# Patient Record
Sex: Female | Born: 1950 | Race: White | Hispanic: Yes | Marital: Single
Health system: Midwestern US, Community
[De-identification: ages and names within clinical notes are randomized; demographics above are authoritative.]

## PROBLEM LIST (undated history)

## (undated) DIAGNOSIS — C50912 Malignant neoplasm of unspecified site of left female breast: Secondary | ICD-10-CM

## (undated) DIAGNOSIS — C50312 Malignant neoplasm of lower-inner quadrant of left female breast: Secondary | ICD-10-CM

## (undated) DIAGNOSIS — R059 Cough, unspecified: Secondary | ICD-10-CM

## (undated) DIAGNOSIS — C50919 Malignant neoplasm of unspecified site of unspecified female breast: Secondary | ICD-10-CM

## (undated) DIAGNOSIS — C50911 Malignant neoplasm of unspecified site of right female breast: Secondary | ICD-10-CM

## (undated) DIAGNOSIS — Z170 Estrogen receptor positive status [ER+]: Secondary | ICD-10-CM

## (undated) DIAGNOSIS — N651 Disproportion of reconstructed breast: Secondary | ICD-10-CM

## (undated) DIAGNOSIS — M75121 Complete rotator cuff tear or rupture of right shoulder, not specified as traumatic: Secondary | ICD-10-CM

## (undated) DIAGNOSIS — N6320 Unspecified lump in the left breast, unspecified quadrant: Secondary | ICD-10-CM

## (undated) DIAGNOSIS — E041 Nontoxic single thyroid nodule: Secondary | ICD-10-CM

## (undated) DIAGNOSIS — R52 Pain, unspecified: Secondary | ICD-10-CM

## (undated) DIAGNOSIS — M810 Age-related osteoporosis without current pathological fracture: Secondary | ICD-10-CM

## (undated) DIAGNOSIS — R609 Edema, unspecified: Secondary | ICD-10-CM

## (undated) DIAGNOSIS — M25569 Pain in unspecified knee: Secondary | ICD-10-CM

## (undated) DIAGNOSIS — F419 Anxiety disorder, unspecified: Secondary | ICD-10-CM

## (undated) DIAGNOSIS — N390 Urinary tract infection, site not specified: Secondary | ICD-10-CM

## (undated) DIAGNOSIS — IMO0002 Reserved for concepts with insufficient information to code with codable children: Secondary | ICD-10-CM

## (undated) DIAGNOSIS — M170 Bilateral primary osteoarthritis of knee: Secondary | ICD-10-CM

## (undated) DIAGNOSIS — T8579XA Infection and inflammatory reaction due to other internal prosthetic devices, implants and grafts, initial encounter: Secondary | ICD-10-CM

## (undated) DIAGNOSIS — D071 Carcinoma in situ of vulva: Secondary | ICD-10-CM

## (undated) DIAGNOSIS — Z452 Encounter for adjustment and management of vascular access device: Secondary | ICD-10-CM

## (undated) DIAGNOSIS — Z853 Personal history of malignant neoplasm of breast: Secondary | ICD-10-CM

## (undated) DIAGNOSIS — Z780 Asymptomatic menopausal state: Secondary | ICD-10-CM

## (undated) DIAGNOSIS — R0602 Shortness of breath: Secondary | ICD-10-CM

## (undated) DIAGNOSIS — Z9189 Other specified personal risk factors, not elsewhere classified: Secondary | ICD-10-CM

## (undated) DIAGNOSIS — Z79899 Other long term (current) drug therapy: Secondary | ICD-10-CM

## (undated) DIAGNOSIS — Z7969 Long term (current) use of other immunomodulators and immunosuppressants: Secondary | ICD-10-CM

## (undated) DIAGNOSIS — I890 Lymphedema, not elsewhere classified: Secondary | ICD-10-CM

## (undated) DIAGNOSIS — M25512 Pain in left shoulder: Secondary | ICD-10-CM

## (undated) DIAGNOSIS — I972 Postmastectomy lymphedema syndrome: Principal | ICD-10-CM

## (undated) DIAGNOSIS — N6324 Unspecified lump in the left breast, lower inner quadrant: Secondary | ICD-10-CM

## (undated) DIAGNOSIS — Z1231 Encounter for screening mammogram for malignant neoplasm of breast: Secondary | ICD-10-CM

## (undated) MED ORDER — CITALOPRAM 20 MG TAB
20 mg | ORAL_TABLET | Freq: Every day | ORAL | Status: DC
Start: ? — End: 2013-02-26

## (undated) MED ORDER — VALACYCLOVIR 500 MG TAB
500 mg | ORAL_TABLET | ORAL | Status: DC
Start: ? — End: 2013-05-29

## (undated) MED ORDER — LORAZEPAM 1 MG TAB
1 mg | ORAL_TABLET | Freq: Three times a day (TID) | ORAL | Status: DC | PRN
Start: ? — End: 2013-01-15

## (undated) MED ORDER — ONDANSETRON 8 MG TAB, RAPID DISSOLVE
8 mg | ORAL_TABLET | Freq: Three times a day (TID) | ORAL | Status: DC | PRN
Start: ? — End: 2013-01-01

## (undated) MED ORDER — MAGIC MOUTHWASH AMBULATORY SIMPLE MEDICATION
ORAL | Status: DC
Start: ? — End: 2013-05-29

## (undated) MED ORDER — HYDROCODONE-ACETAMINOPHEN 5 MG-325 MG TAB
5-325 mg | ORAL_TABLET | ORAL | Status: DC
Start: ? — End: 2013-02-20

## (undated) MED ORDER — OXYCODONE-ACETAMINOPHEN 5 MG-325 MG TAB
5-325 mg | ORAL_TABLET | ORAL | Status: DC | PRN
Start: ? — End: 2013-09-30

## (undated) MED ORDER — METHYLPREDNISOLONE 4 MG TABS IN A DOSE PACK
4 mg | PACK | ORAL | Status: DC
Start: ? — End: 2013-05-29

## (undated) MED ORDER — LIDOCAINE-PRILOCAINE 2.5 %-2.5 % TOPICAL CREAM
CUTANEOUS | Status: DC | PRN
Start: ? — End: 2013-05-29

## (undated) MED ORDER — OXYCODONE-ACETAMINOPHEN 5 MG-325 MG TAB
5-325 mg | ORAL_TABLET | ORAL | Status: DC | PRN
Start: ? — End: 2013-01-07

## (undated) MED ORDER — GRANISETRON 3.1 MG/24 HOUR WEEKLY TRANSDERMAL PATCH
3.1 mg/24 hour | MEDICATED_PATCH | TRANSDERMAL | Status: DC
Start: ? — End: 2013-05-29

## (undated) MED ORDER — HYDROXYZINE 50 MG TAB
50 mg | ORAL_TABLET | Freq: Four times a day (QID) | ORAL | Status: AC | PRN
Start: ? — End: 2013-03-01

## (undated) MED ORDER — CODEINE-BUTALBITAL-ACETAMINOPHEN-CAFFEINE 30 MG-50 MG-325 MG-40 MG CAP
50-325-40-30 mg | ORAL_CAPSULE | ORAL | Status: DC | PRN
Start: ? — End: 2013-05-29

## (undated) MED ORDER — DEXAMETHASONE 4 MG TAB
4 mg | ORAL_TABLET | Freq: Every day | ORAL | Status: DC
Start: ? — End: 2013-05-29

## (undated) MED ORDER — CLONAZEPAM 0.5 MG TAB
0.5 mg | ORAL_TABLET | ORAL | Status: DC
Start: ? — End: 2013-10-25

## (undated) MED ORDER — OXYCODONE-ACETAMINOPHEN 5 MG-325 MG TAB
5-325 mg | ORAL_TABLET | ORAL | Status: DC | PRN
Start: ? — End: 2013-02-05

## (undated) MED ORDER — PEGFILGRASTIM 6 MG/0.6ML SC SYRG
6 mg/0. mL | INJECTION | Freq: Once | SUBCUTANEOUS | Status: AC
Start: ? — End: 2012-12-05

## (undated) MED ORDER — FLUCONAZOLE 150 MG TAB
150 mg | ORAL_TABLET | Freq: Every day | ORAL | Status: AC
Start: ? — End: 2013-02-06

## (undated) MED ORDER — CLONAZEPAM 0.5 MG TAB
0.5 mg | ORAL_TABLET | ORAL | Status: DC
Start: ? — End: 2013-10-30

## (undated) MED ORDER — ESCITALOPRAM 20 MG TAB
20 mg | ORAL_TABLET | Freq: Every day | ORAL | Status: DC
Start: ? — End: 2014-01-07

## (undated) MED ORDER — LORAZEPAM 1 MG TAB
1 mg | ORAL_TABLET | Freq: Three times a day (TID) | ORAL | Status: DC | PRN
Start: ? — End: 2013-02-20

## (undated) MED ORDER — PHENAZOPYRIDINE 200 MG TAB
200 mg | ORAL_TABLET | Freq: Three times a day (TID) | ORAL | Status: AC | PRN
Start: ? — End: 2013-02-16

## (undated) MED ORDER — CLONAZEPAM 0.5 MG TAB
0.5 mg | ORAL_TABLET | ORAL | Status: DC
Start: ? — End: 2013-07-17

## (undated) MED ORDER — CLONAZEPAM 0.5 MG TAB
0.5 mg | ORAL_TABLET | Freq: Two times a day (BID) | ORAL | Status: DC
Start: ? — End: 2013-02-26

## (undated) MED ORDER — ANASTROZOLE 1 MG TAB
1 mg | ORAL_TABLET | Freq: Every day | ORAL | Status: DC
Start: ? — End: 2013-08-06

## (undated) MED ORDER — PROMETHAZINE 12.5 MG TAB
12.5 mg | ORAL_TABLET | Freq: Four times a day (QID) | ORAL | Status: DC | PRN
Start: ? — End: 2013-05-29

## (undated) MED ORDER — EXEMESTANE 25 MG TAB
25 mg | ORAL_TABLET | Freq: Every day | ORAL | Status: AC
Start: ? — End: 2013-11-04

## (undated) MED ORDER — HYDROMORPHONE 2 MG TAB
2 mg | ORAL_TABLET | ORAL | Status: DC | PRN
Start: ? — End: 2013-02-20

## (undated) MED ORDER — OXYCODONE-ACETAMINOPHEN 5 MG-325 MG TAB
5-325 mg | ORAL_TABLET | ORAL | Status: DC | PRN
Start: ? — End: 2015-03-10

## (undated) MED ORDER — CLONAZEPAM 0.5 MG TAB
0.5 mg | ORAL_TABLET | Freq: Two times a day (BID) | ORAL | Status: DC
Start: ? — End: 2013-07-17

## (undated) MED ORDER — ONDANSETRON 8 MG TAB, RAPID DISSOLVE
8 mg | ORAL_TABLET | Freq: Three times a day (TID) | ORAL | Status: DC | PRN
Start: ? — End: 2013-02-05

## (undated) MED ORDER — CLONAZEPAM 0.5 MG TAB
0.5 mg | ORAL_TABLET | ORAL | Status: DC
Start: ? — End: 2013-12-05

## (undated) MED ORDER — HYDROMORPHONE 2 MG TAB
2 mg | ORAL_TABLET | ORAL | Status: DC | PRN
Start: ? — End: 2013-12-05

## (undated) MED ORDER — CLONAZEPAM 0.5 MG TAB
0.5 mg | ORAL_TABLET | Freq: Two times a day (BID) | ORAL | Status: DC
Start: ? — End: 2013-04-08

## (undated) MED ORDER — PROCHLORPERAZINE MALEATE 10 MG TAB
10 mg | ORAL_TABLET | Freq: Four times a day (QID) | ORAL | Status: DC | PRN
Start: ? — End: 2013-02-20

## (undated) MED ORDER — DIPHENHYDRAMINE 50 MG TAB
50 mg | ORAL_TABLET | Freq: Every evening | ORAL | Status: DC | PRN
Start: ? — End: 2013-12-05

## (undated) MED ORDER — VALACYCLOVIR 500 MG TAB
500 mg | ORAL_TABLET | ORAL | Status: DC
Start: ? — End: 2013-05-09

## (undated) MED ORDER — HYDROMORPHONE 2 MG TAB
2 mg | ORAL_TABLET | ORAL | Status: DC | PRN
Start: ? — End: 2013-05-29

## (undated) MED ORDER — OXYCODONE-ACETAMINOPHEN 5 MG-325 MG TAB
5-325 mg | ORAL_TABLET | ORAL | Status: DC | PRN
Start: ? — End: 2013-02-26

## (undated) MED ORDER — CITALOPRAM 40 MG TAB
40 mg | ORAL_TABLET | Freq: Every day | ORAL | Status: DC
Start: ? — End: 2013-09-06

## (undated) MED ORDER — OXYCODONE-ACETAMINOPHEN 5 MG-325 MG TAB
5-325 mg | ORAL_TABLET | ORAL | Status: DC | PRN
Start: ? — End: 2013-07-30

## (undated) MED ORDER — OTHER
Status: DC
Start: ? — End: 2013-02-05

## (undated) MED ORDER — OXYCODONE-ACETAMINOPHEN 5 MG-325 MG TAB
5-325 mg | ORAL_TABLET | ORAL | Status: DC | PRN
Start: ? — End: 2013-07-17

## (undated) MED ORDER — DIAZEPAM 5 MG TAB
5 mg | ORAL_TABLET | Freq: Four times a day (QID) | ORAL | Status: DC | PRN
Start: ? — End: 2013-12-05

## (undated) MED ORDER — OTHER
Status: DC
Start: ? — End: 2013-12-05

## (undated) MED ORDER — ONDANSETRON HCL 8 MG TAB
8 mg | ORAL_TABLET | Freq: Three times a day (TID) | ORAL | Status: DC | PRN
Start: ? — End: 2013-05-29

## (undated) MED ORDER — OXYCODONE-ACETAMINOPHEN 5 MG-325 MG TAB
5-325 mg | ORAL_TABLET | ORAL | Status: DC | PRN
Start: ? — End: 2013-01-15

## (undated) MED ORDER — CITALOPRAM 20 MG TAB
20 mg | ORAL_TABLET | Freq: Every day | ORAL | Status: DC
Start: ? — End: 2013-05-23

## (undated) MED ORDER — OXYCODONE-ACETAMINOPHEN 5 MG-325 MG TAB
5-325 mg | ORAL_TABLET | ORAL | Status: DC | PRN
Start: ? — End: 2013-05-29

## (undated) MED ORDER — LEVOFLOXACIN 250 MG TAB
250 mg | ORAL_TABLET | Freq: Every day | ORAL | Status: AC
Start: ? — End: 2013-02-16

## (undated) MED ORDER — CITALOPRAM 40 MG TAB
40 mg | ORAL_TABLET | Freq: Every day | ORAL | Status: DC
Start: ? — End: 2013-05-31

## (undated) MED ORDER — TAMOXIFEN 20 MG TAB
20 mg | ORAL_TABLET | Freq: Every day | ORAL | Status: DC
Start: ? — End: 2014-03-19

---

## 2008-04-11 LAB — METABOLIC PANEL, COMPREHENSIVE
A-G Ratio: 1.4 (ref 1.1–2.2)
ALT (SGPT): 32 U/L (ref 30–65)
AST (SGOT): 17 U/L (ref 15–37)
Albumin: 4.2 g/dL (ref 3.5–5.0)
Alk. phosphatase: 78 U/L (ref 50–136)
Anion gap: 3 mmol/L — ABNORMAL LOW (ref 5–15)
BUN/Creatinine ratio: 23 — ABNORMAL HIGH (ref 12–20)
BUN: 14 MG/DL (ref 6–20)
Bilirubin, total: 0.6 MG/DL (ref <1.0)
CO2: 31 MMOL/L (ref 21–32)
Calcium: 9.2 MG/DL (ref 8.5–10.1)
Chloride: 103 MMOL/L (ref 97–108)
Creatinine: 0.6 MG/DL (ref 0.6–1.3)
GFR est AA: >60 mL/min/{1.73_m2} (ref >60)
GFR est non-AA: >60 mL/min/{1.73_m2} (ref >60)
Globulin: 2.9 g/dL (ref 2.0–4.0)
Glucose: 77 MG/DL (ref 50–100)
Potassium: 4.3 MMOL/L (ref 3.5–5.1)
Protein, total: 7.1 g/dL (ref 6.4–8.2)
Sodium: 137 MMOL/L (ref 136–145)

## 2008-04-11 LAB — CBC WITH AUTOMATED DIFF
ABS. BASOPHILS: 0 10*3/uL (ref 0.0–0.1)
ABS. EOSINOPHILS: 0.1 10*3/uL (ref 0.0–0.4)
ABS. LYMPHOCYTES: 2.4 10*3/uL (ref 0.8–3.5)
ABS. MONOCYTES: 0.4 10*3/uL (ref 0–1.0)
ABS. NEUTROPHILS: 2.5 10*3/uL (ref 1.8–8.0)
BASOPHILS: 0 % (ref 0–1)
EOSINOPHILS: 2 % (ref 0–7)
HCT: 39.3 % (ref 35.0–47.0)
HGB: 13.1 g/dL (ref 11.5–16.0)
LYMPHOCYTES: 45 % (ref 12–49)
MCH: 29.4 PG (ref 26.0–34.0)
MCHC: 33.3 g/dL (ref 30.0–35.0)
MCV: 88.1 FL (ref 80.0–99.0)
MONOCYTES: 7 % (ref 5–13)
NEUTROPHILS: 46 % (ref 32–75)
PLATELET: 263 10*3/uL (ref 150–400)
RBC: 4.46 M/uL (ref 3.80–5.20)
RDW: 13.2 % (ref 11.5–14.5)
WBC: 5.4 10*3/uL (ref 3.6–11.0)

## 2008-04-11 LAB — T3, UPTAKE
Free thyroxine index: 2.7 (ref 1.4–3.1)
T3 Uptake: 34 % (ref 23–37)

## 2008-04-11 LAB — T4 (THYROXINE): T4, Total: 7.8 ug/dL (ref 5–12)

## 2008-04-11 LAB — LDL, DIRECT: LDL,Direct: 99 mg/dl (ref 0–100)

## 2008-04-11 LAB — TSH 3RD GENERATION: TSH: 1.38 u[IU]/mL (ref 0.35–5.5)

## 2008-04-13 LAB — CALCITONIN: CALCITONIN: 2.5 PG/ML (ref 0.0–4.6)

## 2008-04-15 LAB — METANEPHRINES, PLASMA
METANEPHRINES: 0.17 nmol/L (ref 0.00–0.49)
NORMETANEPHRINE: 0.38 nmol/L (ref 0.00–0.89)

## 2010-04-21 MED ADMIN — lidocaine (PF) (XYLOCAINE) 10 mg/mL (1 %) injection Soln 10 mL: SUBCUTANEOUS | @ 19:00:00 | NDC 63323049205

## 2010-04-21 MED ADMIN — ioversol (OPTIRAY) 320 mg/mL contrast injection 5 mL: INTRA_ARTICULAR | @ 19:00:00 | NDC 00019132306

## 2010-04-21 MED ADMIN — sodium bicarbonate (NEUT) injection 40 mg: SUBCUTANEOUS | @ 19:00:00 | NDC 00409660902

## 2012-02-13 ENCOUNTER — Encounter

## 2012-10-22 NOTE — Progress Notes (Signed)
HISTORY OF PRESENT ILLNESS  Emily [name] is a 61 y.o. female.  HPI New patient referred by Dr. Lisa West for new diagnosis of LEFT breast cancer. Complains of pain to LEFT breast.  Patient is able to palpate a lump.  Denies any other breast changes, skin changes, rashes, or nipple drainage. Biopsy done on LEFT axilla and LEFT breast lump on 10/16/12.    Denies FH of breast and ovarian cancer.    Last mammogram was 09/24/12, BI-RADS 4a.  MRI done 13/14  MRI of brain done today.     Review of Systems   Constitutional: Positive for malaise/fatigue.   Eyes: Negative.    Respiratory: Positive for shortness of breath.    Cardiovascular: Negative.    Gastrointestinal: Negative.    Genitourinary: Negative.    Musculoskeletal: Negative.    Skin: Negative.    Neurological: Positive for headaches.   Endo/Heme/Allergies: Negative.    Psychiatric/Behavioral: Negative.        Physical Exam   Cardiovascular: Normal rate and normal heart sounds.    Pulmonary/Chest: Breath sounds normal. Right breast exhibits no inverted nipple, no mass, no nipple discharge, no skin change and no tenderness. Left breast exhibits no inverted nipple, no mass, no nipple discharge, no skin change and no tenderness. Breasts are symmetrical.       Lymphadenopathy:        Right cervical: No superficial cervical, no deep cervical and no posterior cervical adenopathy present.       Left cervical: No superficial cervical, no deep cervical and no posterior cervical adenopathy present.        Right axillary: No pectoral and no lateral adenopathy present.        Left axillary: No pectoral and no lateral adenopathy present.      ASSESSMENT and PLAN  Encounter Diagnoses   Name Primary?   ??? Breast cancer Yes   ??? Breast cancer, stage 2        Complicated case.  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  Unclear whether this is breast primary  or metastatic. A total of  45 minutes were spent face-to-face with the patient during this encounter and over half of that time was spent on counseling and coordination of care.  We discussed in depth the pathology results and need for surgery.  Discussed  options with risks and complications of mastectomy with reconstruction and lumpectomy. She will need XRT with both given pos node.  Also discussed potential for chemotherapy.  Will discuss with med onc and she will get another opinion.  Will also see plastics today.  Will try to send mammaprint on core bx to determine the utility of possible neoadjuvant chemotherapy.

## 2012-10-22 NOTE — Telephone Encounter (Signed)
mammaprint

## 2012-10-22 NOTE — Telephone Encounter (Signed)
Informed patient of normal chest x-ray.     Faxed result to her work at [phone] per her request.  She has an appointment with Dr. Elroy Channel tomorrow morning.

## 2012-10-22 NOTE — Telephone Encounter (Signed)
Called pt to try to schedule new pt appt tomorrow at 2:00pm with Dr. Elroy Channel. Referral from Dr. Maxwell Marion.

## 2012-10-22 NOTE — Telephone Encounter (Signed)
Emily [name]

## 2012-10-22 NOTE — Communication Body (Signed)
HISTORY OF PRESENT ILLNESS  Emily [name] is a 62 y.o. female.  HPI New patient referred by Dr. Cephus Richer for new diagnosis of LEFT breast cancer. Complains of pain to LEFT breast.  Patient is able to palpate a lump.  Denies any other breast changes, skin changes, rashes, or nipple drainage. Biopsy done on LEFT axilla and LEFT breast lump on 10/16/12.    Denies FH of breast and ovarian cancer.    Last mammogram was 09/24/12, BI-RADS 4a.  MRI done 13/14  MRI of brain done today.     Review of Systems   Constitutional: Positive for malaise/fatigue.   Eyes: Negative.    Respiratory: Positive for shortness of breath.    Cardiovascular: Negative.    Gastrointestinal: Negative.    Genitourinary: Negative.    Musculoskeletal: Negative.    Skin: Negative.    Neurological: Positive for headaches.   Endo/Heme/Allergies: Negative.    Psychiatric/Behavioral: Negative.        Physical Exam   Cardiovascular: Normal rate and normal heart sounds.    Pulmonary/Chest: Breath sounds normal. Right breast exhibits no inverted nipple, no mass, no nipple discharge, no skin change and no tenderness. Left breast exhibits no inverted nipple, no mass, no nipple discharge, no skin change and no tenderness. Breasts are symmetrical.       Lymphadenopathy:        Right cervical: No superficial cervical, no deep cervical and no posterior cervical adenopathy present.       Left cervical: No superficial cervical, no deep cervical and no posterior cervical adenopathy present.        Right axillary: No pectoral and no lateral adenopathy present.        Left axillary: No pectoral and no lateral adenopathy present.      ASSESSMENT and PLAN  Encounter Diagnoses   Name Primary?   ??? Breast cancer Yes   ??? Breast cancer, stage 2        Complicated case.  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  Unclear whether this is breast primary  or metastatic. A total of  45 minutes were spent face-to-face with the patient during this encounter and over half of that time was spent on counseling and coordination of care.  We discussed in depth the pathology results and need for surgery.  Discussed  options with risks and complications of mastectomy with reconstruction and lumpectomy. She will need XRT with both given pos node.  Also discussed potential for chemotherapy.  Will discuss with med onc and she will get another opinion.  Will also see plastics today.  Will try to send mammaprint on core bx to determine the utility of possible neoadjuvant chemotherapy.

## 2012-10-23 NOTE — Telephone Encounter (Signed)
Called [facility] Pathology Department to see if HER-2 and Ki-67 had been run on her recent pathology specimen.      Dr. Iline Oven called me back and let me know that he will order both of these tests, and they will be back at the end of the week.  Advised him that she had an appointment today with Dr. Elroy Channel, Medical Oncology.   He stated that this was a rather unusual case, and offered to speak with Dr. Maxwell Marion or Dr. Elroy Channel regarding the path if they were interested.    I will let them know that he is available and offer his phone number.  He is there every day this week except for Friday.  I will check later in the week to make sure that these results have been faxed to [place].

## 2012-10-23 NOTE — Addendum Note (Signed)
Addended by: Wallie Char on: 10/23/2012 11:49 AM     Modules accepted: Level of Service

## 2012-10-23 NOTE — Progress Notes (Signed)
Emily [name] is a 62 y.o. female new patient for breast cancer.

## 2012-10-23 NOTE — Patient Instructions (Addendum)
mammaprint ordered -- genomic test    HER 2 waiting on    Know it is ER  And PR +    Plan:  Wait for her2    Likely surgery first

## 2012-10-23 NOTE — Telephone Encounter (Signed)
IRVIN - 10/23/2012 @ 8:00 AM    Patient notified

## 2012-10-23 NOTE — Progress Notes (Signed)
[facility]  [address], [address]   [place], [place]   [address]  W: [phone]   F: [phone]      NEW HEME/ONC CONSULT      Reason for visit: evaluation for treatment for   Breast Cancer      HPI:   Emily [name] is a 62 y.o.  female who I was asked to see in consultation at the request of Dr. Maxwell Marion for evaluation for systemic therapy for breast cancer.      She had a screening ultrasound on 09/24/12 showing a 2.6 cm left LN.  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    She has a history of a pheochromocytoma surgically removed in 1997.    She then had a MRI breast on 10/05/12 showing the 2.8 x 2 cm LN as well as a left breast LIQ 0.9 cm x 1.1 cm x 0.6 cm mass.  Biopsy of that mass by FNA on 10/16/12 shows a poorly differentiated carcinoma.  These slides were air-dried, HER 2 not able to be performed on them.    She is anxious today.    DX   Encounter Diagnosis   Name Primary?   ??? Breast cancer, stage 2 Yes              History reviewed. No pertinent past medical history.  Past Surgical History   Procedure Laterality Date   ??? Pr breast surgery procedure unlisted  '91     breast implants   ??? Hx heent       T&A   ??? Hx heent       right adrenal removed   ??? Hx gyn       c-sections   ??? Pr abdomen surgery proc unlisted       hernia mesh repair     History     Social History   ??? Marital Status: SINGLE     Spouse Name: N/A     Number of Children: N/A   ??? Years of Education: N/A     Social History Main Topics   ??? Smoking status: Never Smoker    ??? Smokeless tobacco: Not on file   ??? Alcohol Use: No   ??? Drug Use: Not on file   ??? Sexually Active: Not on file     Other Topics Concern   ??? Not on file     Social History  Narrative   ??? No narrative on file     Family History   Problem Relation Age of Onset   ??? Alzheimer Mother    ??? Diabetes Mother    ??? Heart Disease Father    ??? Diabetes Father    ??? Headache Sister        Current Outpatient Prescriptions   Medication Sig Dispense Refill   ??? b complex vitamins tablet Take 1 Tab by mouth daily.       ??? FOLIC ACID PO Take  by mouth.         No current facility-administered medications for this visit.       Allergies   Allergen Reactions   ??? Aspirin Unknown (comments)     Gi bleeding   ??? Keflex (Cephalexin) Rash   ??? Nsaids (  Non-Steroidal Anti-Inflammatory Drug) Swelling     Throat swells       Review of Systems    A comprehensive review of systems was performed and all systems were negative except for HPI.        Objective:  Physical Exam:  BP 115/57   Pulse 71   Temp(Src) 97 ??F (36.1 ??C) (Oral)   Resp 15   Ht 5\' 6"  (1.676 m)   Wt 123 lb (55.792 kg)   BMI 19.86 kg/m2   SpO2 98%    General:  Alert, cooperative, no distress, appears stated age.   Head:  Normocephalic, without obvious abnormality, atraumatic.   Eyes:  Conjunctivae/corneas clear. PERRL, EOMs intact.   Throat: Lips, mucosa, and tongue normal.    Neck: Supple, symmetrical, trachea midline, no adenopathy, thyroid: no enlargement/tenderness/nodules   Back:   Symmetric, no curvature. ROM normal. No CVA tenderness.   Lungs:   Clear to auscultation bilaterally.   Chest wall:  No tenderness or deformity.   Heart:  Regular rate and rhythm, S1, S2 normal, no murmur, click, rub or gallop.   Breast Exam:  Left breast with a marble size lesion palpable only when the patient leans anteriorly, deep and posterior to her implant; a LN is palpable close to her implant, but size not able to be measured   Abdomen:   Soft, non-tender. Bowel sounds normal. No masses,  No organomegaly.   Extremities: Extremities normal, atraumatic, no cyanosis or edema.   Skin: Skin color, texture, turgor normal. No rashes or lesions.   Lymph nodes: Cervical,  supraclavicular, nodes normal.   Neurologic: CNII-XII intact.       Diagnostic Imaging   Results for orders placed during the hospital encounter of 10/22/12   XR CHEST PA LAT    Narrative **Final Report**       ICD Codes / Adm.Diagnosis: 174.9   / Malignant neoplasm of breast (    Examination:  CR CHEST PA AND LATERAL  - [id number] - Oct 22 2012  1:57PM  Accession No:  [id number]  Reason:  breast cancer and shortness of breath      REPORT:  2 view chest    INDICATION: History of breast cancer.    COMPARISON: None.    FINDINGS: PA and lateral radiographs of the chest demonstrate clear lungs.   The cardiac and mediastinal contours and pulmonary vascularity are normal.   Breast prosthesis is noted. The bones and soft tissues are within normal   limits.        IMPRESSION: No acute process                  Signing/Reading Doctor: Julieanne Cotton [phone])    Approved: Julieanne Cotton [phone])  Oct 22 2012  3:44PM                                      Results for orders placed in visit on 10/22/12   PET BREAST CANCER DIAGNOSIS   10/12/12 PET:  Negative for distant mets  10/22/12 brain MRI:  negative    Lab Results  Lab Results   Component Value Date/Time    WBC 5.4 04/11/2008 10:20 AM    HGB 13.1 04/11/2008 10:20 AM    HCT 39.3 04/11/2008 10:20 AM    PLATELET 263 04/11/2008 10:20 AM    MCV 88.1 04/11/2008 10:20 AM  Lab Results   Component Value Date/Time    Sodium 137 04/11/2008 10:20 AM    Potassium 4.3 04/11/2008 10:20 AM    Chloride 103 04/11/2008 10:20 AM    CO2 31 04/11/2008 10:20 AM    Anion gap 3 04/11/2008 10:20 AM    Glucose 77 04/11/2008 10:20 AM    BUN 14 04/11/2008 10:20 AM    Creatinine 0.6 04/11/2008 10:20 AM    BUN/Creatinine ratio 23 04/11/2008 10:20 AM    GFR est AA >60 04/11/2008 10:20 AM    GFR est non-AA >60 04/11/2008 10:20 AM    Calcium 9.2 04/11/2008 10:20 AM    Bilirubin, total 0.6 04/11/2008 10:20 AM    ALT 32 04/11/2008 10:20 AM    AST 17 04/11/2008 10:20 AM    Alk. phosphatase 78 04/11/2008 10:20 AM    Protein, total 7.1  04/11/2008 10:20 AM    Albumin 4.2 04/11/2008 10:20 AM    Globulin 2.9 04/11/2008 10:20 AM    A-G Ratio 1.4 04/11/2008 10:20 AM       .    Assessment/Plan:  62 y.o. female with a LN + left breast cancer with neuroendocrine features.  ER and PR +.  Outside Records reviewed and summarized above.    PS 0    1. Breast cancer with neuroendocrine features, stage IIA.    We explained to the patient that the goal of systemic adjuvant therapy is to improve the chances for cure and decrease the risk of relapse. We explained why a patient can have microscopic cancer spread now even though physical examination, laboratory studies and imaging studies are negative for cancer. We explained that the same treatments used now as adjuvant or preventive treatments rarely if ever are curative in women who develop metastases.     We discussed that there is no difference in overall survival between neoadjuvant and adjuvant chemotherapy.  We discussed the rationale for neoadjuvant chemotherapy, if chemotherapy is warranted, as it is in this case: to avoid any potential delays in giving chemotherapy following surgery, to be able to see the response of the tumor to chemotherapy, and to potentially downstage the tumor prior to surgery.    Recent literature Pernell Dupre et al, The Breast, Dec 2013) discussed this potentially rare event of a neuroendocrine tumor of the breast.  It is not clear from the pathology that we have that she has a NET of the breast or (more likley) that she has a breast cancer with neuroendocrine features.  Her PET scan suggests strongly that this is one of the 2.  There is no evidence at this time that this is small cell carcinoma, which would be treated differently.  I have discussed this case with Dr. Mayford Knife and both I and the patient would like a 2nd path opinion on what she has already had biopsied and we have ordered this for Dr. Mayford Knife to review.    In reviewing the literature, evidence strongly suggests that standard  breast surgical and medical treatments treat these patients as well as usual-type breast cancers.  There appears to be little difference between the effectiveness of adjuvant small-cell chemo regimens compared with usual type breast cancer regimens (anthrcycline based).  Endocrine therapy should be used for ER + tumors.    Based on the literature, my recommendation is for HER 2 testing of her biopsied LN.  If negative, I recommend proceeding with surgery with Dr. Maxwell Marion with an ANLD.  Port to be placed at that time  for chemo.  I am recommending adjuvant chemo based on her good PS, her young age, and her LN + disease.  Dr. Maxwell Marion has ordered a mammaprint, which I agree with, but may not be able to be performed on existing tissue.    We discussed the potential value of Mammaprint testing.  The [place] 70-gene prognostic profile (Mammaprint??), one of the first gene expression array-based prognosticators, classifies tumors as low-risk or high-risk for breast cancer recurrence. A  validation study used a truly independent patient cohort of 302 women; patients were under age 97, had node-negative T1 to T2 tumors, were treated without adjuvant systemic therapy, and were followed for over 10 years. The 70-gene signature performed independent of clinical variables in predicting time to distant metastasis (hazard ratio, HR 2.13) and overall survival (HR 2.63), but not disease-free survival (HR 1.36). Patients in the gene signature high-risk group had a 10-year overall survival of 70 percent versus 90 percent for patients in the gene signature low-risk group.  The clinical utility of the 70-gene profile will come from a large international study, the MINDACT (Microarray in Node-Negative Disease May Avoid Chemotherapy) trial, in which women with node negative breast cancer undergo clinical risk assessment (using a commonly used online tool, Adjuvant! Online) and the 70-gene signature. Patients with discordant clinical  and genomic predictions are being randomly assigned to receive or not receive adjuvant chemotherapy.  Data suggest that the 70-gene profile also predicts responsiveness to conventional chemotherapeutic regimens. We discussed that it has been validated to correlate with high or low outcome risk for distant metastases.    If her HER 2 result is +, I recommend discussing with her neoadjuvant chemo.    In the future, based on her pheo and now breast cancer history, I recommend genetic counseling.    Thank you for this consult.  All of the patient's questions were answered today.    > 80 min were spent with this patient with > 50% of that time spent in face to face counseling.    Patient Instructions   mammaprint ordered -- genomic test    HER 2 waiting on    Know it is ER  And PR +    Plan:  Wait for her2    Likely surgery first     Follow-up Disposition:  Return in 8 days (on 10/31/2012).    Leotis Shames, MD

## 2012-10-23 NOTE — Addendum Note (Signed)
Addended by: Wallie Char on: 10/23/2012 08:34 AM     Modules accepted: Level of Service

## 2012-10-25 NOTE — Telephone Encounter (Signed)
Spoke with pt and advised that results were not back yet, and that nurse would call [place] doctor's tomorrow, to check on status. Pt states that she would like a call back tomorrow and have copy faxed to [phone].

## 2012-10-25 NOTE — Telephone Encounter (Signed)
Pt called to see if her HER2 results were in. She states she is very "anxious"  and request that if have not received results could we please call [facility], where she had the testing done, to check on them.

## 2012-10-26 NOTE — Telephone Encounter (Signed)
Pt spoke with Dr. Elroy Channel and path report faxed to pt, per request. Pt states that she will call office back to reschedule appointment once she has surgery scheduled.

## 2012-10-26 NOTE — Telephone Encounter (Signed)
Spoke with pathology at [facility] and they state they are sendong over Her2 results for pt.

## 2012-10-29 NOTE — Telephone Encounter (Signed)
Pt called and was a little upset stating she is feeling overwhelmed and would like to know a little more about her treatment plan. Pt reports she is scheduling her surgery and that it may be pushed back to Feb 26, pending plastics availability (pt has appt with plastics tomorrow). Dr. Saul Fordyce office mentioned pt may be getting a port. Pt upset about this as she states she doesn't remember discussing this with Dr. Elroy Channel.    Can you please verify pt's treatment regimen. Pt has requested I call her back and explain this to help her understand what to expect, how to best plan care for her 105 month old child, etc.      Thanks.

## 2012-10-29 NOTE — Telephone Encounter (Signed)
UPDATED PLAN PENDING APPT WITH CHEN    Left lumpectomy with needle loc, left axillary node dissection, portacath insertion, stm, Feb 13??? Gen.

## 2012-10-29 NOTE — Telephone Encounter (Signed)
Spoke with patient and re-confirmed that she will need chemo following surgery. She will speak with Dr. Maxwell Marion about proceeding with surgery and then returning later for reconstruction.

## 2012-10-30 NOTE — Progress Notes (Signed)
Faxed request to [facility] for MammaPrint per Dr. Maxwell Marion.

## 2012-10-31 ENCOUNTER — Encounter

## 2012-10-31 NOTE — Telephone Encounter (Signed)
Change surgery of left modified radical mastectomy with reconstruction, port

## 2012-11-01 NOTE — Telephone Encounter (Signed)
Pt called and wanted hotel information for her family when she has surgery at [facility]. She also wanted hotel information for herself when she has chemotherapy at [facility]. Provided pt with information on hotel discount rates, info, etc.    No other questions, concerns at this time.

## 2012-11-01 NOTE — Telephone Encounter (Signed)
Returned patient call: she has requested that all post op meds that will be needed be called into her preferred pharmacy pre-operatively for her.  After Speaking with Dr. Maxwell Marion I called the patient to let her Dr. Imogene Burn will be handling her post op med. Needs. She will talk to him about it at preop visit

## 2012-11-08 NOTE — Progress Notes (Signed)
Placed packet in mail to patient with information regarding upcoming surgery. Materials included in packet include [facility] pre-op teaching packet, info from [facility] references on breast cancer surgery, mastectomy, breast reconstruction, preventing lymphedema, and port-a-cath. She is having a mastectomy with reconstruction (Dr. Imogene Burn) and port-a-cath placement. I emphasized in the materials for her to defer any plastic surgery questions to Dr. Imogene Burn. I included my business card as a contact.

## 2012-11-08 NOTE — Patient Instructions (Signed)
Preventing Lymphedema After Treatment for Breast Cancer: After Your Visit  Your Care Instructions  Lymphedema is a buildup of fluid in the soft tissues of the body. It can happen in the arm after breast cancer surgery to remove lymph nodes. If there are few or no lymph nodes, fluid can build up in the arm. It can also happen if the lymph system in an arm has been damaged. Infection, tumors, and scar tissue from radiation therapy to the armpit area also can cause fluid to build up.  You may be able to avoid lymphedema or keep it under control by following the tips below. Make sure that you take good care of the skin on your arm and hand. Your skin acts as a barrier to keep out bacteria and prevent infection. It is also important not to overuse the muscles in the arm. And don't expose your arm to very hot or cold temperatures.  Lymphedema can happen soon after breast cancer treatment. Or it may happen many years later. It may affect only part of your arm or hand. In some cases, it affects all of the arm. Make sure to follow these precautions even after you finish treatment. Do not ignore tightness or swelling in or around your arm or hand. You are less likely to have long-term problems if you get these symptoms treated right away.  Follow-up care is a key part of your treatment and safety. Be sure to make and go to all appointments, and call your doctor if you are having problems. It's also a good idea to know your test results and keep a list of the medicines you take.  How can you care for yourself at home?  Skin care  ?? Keep your arm, hand, and armpit clean. Use a mild soap that does not dry out your skin.  ?? Moisturize your skin often.  ?? Take good care of the skin around your fingernails. Do not bite or cut your cuticles.  ?? Ask your doctor how to handle any cuts, scratches, insect bites, or other injuries you may get.  ?? Use sunscreen and insect repellent outdoors to protect your skin from sunburn and insect  bites.  ?? Use an electric razor to shave your armpit. It's less likely to cut or irritate your skin.  Activity  ?? Don't wear clothing or jewelry that is tight on your arm or hand. Your doctor may advise you not to wear a watch or rings on the affected hand.  ?? Wear gloves when you do activities that could hurt the skin on your fingers or hand. Wear them when you garden, do yard work, wash dishes, and clean with chemicals. Use oven mitts when you handle hot food.  ?? Do not have blood drawn from the arm on the side of the lymph node surgery. Do not get injections (shots) or have an IV put in the affected arm.  ?? Do not allow a blood pressure cuff to be placed on that arm. If you are in the hospital, make sure you tell your nurse and other hospital staff about your condition.  ?? Do not expose your arm to very hot or very cold temperatures. For example, do not use hot tubs, saunas, or steam rooms. Do not use a heating pad or cold pack on that arm or shoulder.  ?? Rest your arm often when you do repeated movements, such as vacuum, scrub, or mop.  ?? Try to use your other arm to carry  heavy things, such as grocery bags. Avoid shoulder straps when you carry a briefcase or purse. Carry your purse on your good arm.  ?? Ask your doctor about wearing a compression sleeve and glove (gauntlet). Your doctor may want you to wear these when you exercise or when you fly in an airplane. They can help keep fluid from pooling in your arm and hand.  Exercise  ?? Ask your doctor about exercises for your arm and hand. Your doctor may recommend that you see a physical therapist. This person can teach you how to do self-massage to move fluid out of your arm.  ?? Check with your doctor before you start exercises that use the arm. This includes tennis, rowing, or weight lifting. Your doctor can help you find an activity level that is right for you.  When should you call for help?  Call your doctor now or seek immediate medical care if:  ?? You have  signs of infection, such as:  ?? Increased pain, swelling, warmth, or redness in your arm or hand.  ?? Red streaks leading from the area of lymph node surgery or radiation.  ?? Pus draining from a cut or scrape in your skin on your arm or hand.  ?? A fever.  Watch closely for changes in your health, and be sure to contact your doctor if:  ?? You have a feeling of tightness or swelling in or around your arm or hand.  ?? You have pain, aching, weakness, or a "pins and needles" feeling in your arm or hand.  ?? Your rings, watches, or bracelets feel tight, but you have not gained weight.  ?? You notice that one arm looks larger than the other.  ?? You cannot bend your fingers, wrist, or elbow as much as usual.   Where can you learn more?   Go to [email or web address]  Enter G546 in the search box to learn more about "Preventing Lymphedema After Treatment for Breast Cancer: After Your Visit."   ?? 2006-2013 [employer]. Care instructions adapted under license by [employer] (which disclaims liability or warranty for this information). This care instruction is for use with your licensed healthcare professional. If you have questions about a medical condition or this instruction, always ask your healthcare professional. [employer] disclaims any warranty or liability for your use of this information.  Content Version: 9.9.209917; Last Revised: Mar 01, 2012              Learning About Breast Cancer Surgery  What is breast cancer surgery?  Surgery is a key part of treatment for breast cancer. The type of surgery you have depends on the size, location, and type of the cancer. It also depends on your overall health and personal preferences. You may have surgery that removes the tumor but does not remove the whole breast. Or you may have surgery to remove the whole breast.  Breast cancer usually needs a combination of treatments. You may have surgery to remove all the cancer that can be seen. But  after surgery you may also need radiation, chemotherapy, or hormone therapy. These treatments get rid of any cancer cells that may be left.  What surgeries are used?  ?? Surgery that does not remove the whole breast is called breast-conserving surgery (lumpectomy). It removes the tumor in the breast and a small amount of normal tissue around it. It is also called a partial mastectomy.  ?? Surgeries to remove the whole breast are called:  ??  Total mastectomy. This removes the whole breast, including the nipple.  ?? Nipple-sparing mastectomy. This removes the whole breast but leaves the nipple.  ?? Modified radical mastectomy. This removes the whole breast and some of the lymph nodes under the arm.  ?? Radical mastectomy. This removes the whole breast, the chest muscles under the breast, and all the lymph nodes under the arm.  During the surgery, the doctor may remove lymph nodes from the armpit. The lymph nodes will be looked at under a microscope. This is used to check if cancer has spread from the breast into the lymph nodes. There are two types of lymph node surgery:  ?? Axillary lymph node dissection. This removes some or all of the lymph nodes in the armpit.  ?? Sentinel lymph node biopsy. This removes the lymph node that is the first to receive lymph fluid from the tumor. If the sentinel node does not contain cancer, it is unlikely that the cancer has spread. Then the doctor won't remove any more lymph nodes. If there is cancer in the sentinel node, the doctor will take out other nearby lymph nodes.  What can you expect after surgery?  Your doctor will send the breast tissue to a lab for testing. This will help the doctor know more about the type of cancer you have. It may take up to a week to get the results back. Your doctor will discuss the results with you. You may meet with a doctor who specializes in cancer treatment (an oncologist) to decide about any other treatment you may need. Your personal preferences are  important when choosing a treatment that is right for you.  The amount of time you will need to recover depends on the type of surgery you had and whether you need any more treatment.  After a mastectomy, some women choose to have surgery to restore the way the breast looked. This surgery is called reconstruction. It is done by a Engineer, petroleum. It can sometimes be done at the same time as the mastectomy. Or you may choose to have it done later. Some women choose to wear a breast-form (prosthesis) in their bra instead of having reconstructive surgery. Others decide to go without a prosthesis or reconstructive surgery. You choose what feels right for you.  If you had a lumpectomy, you will probably look the same in a bra. But your breasts may not match in size or shape after surgery. This depends on the size of your breasts and how much tissue was removed.  No matter what kind of surgery you have, you will get information about your treatment. This includes how to prepare, what to expect, and what to do afterward.  Follow-up care is a key part of your treatment and safety. Be sure to make and go to all appointments, and call your doctor if you are having problems. It's also a good idea to know your test results and keep a list of the medicines you take.   Where can you learn more?   Go to [email or web address]  Enter P020 in the search box to learn more about "Learning About Breast Cancer Surgery."   ?? 2006-2013 [employer]. Care instructions adapted under license by [employer] (which disclaims liability or warranty for this information). This care instruction is for use with your licensed healthcare professional. If you have questions about a medical condition or this instruction, always ask your healthcare professional. [employer] disclaims any warranty or liability for your use of  this information.  Content Version: 9.9.209917; Last Revised: November 10, 2011               Learning About Mastectomy  What is a mastectomy?  A mastectomy is surgery to remove the breast. Every woman's treatment plan and breast surgery are different. Your doctor will tell you how much of your breast will be removed. In some cases, lymph nodes and chest muscle are also removed.  When you find out that you have cancer, you may feel many emotions and may need some help coping. Seek out family, friends, and counselors for support. You also can do things at home to make yourself feel better while you go through treatment. Call the American Cancer Society [phone]) or visit its website at www.cancer.org for more information.  How is this surgery done?  ?? In a simple or total mastectomy, the entire breast is removed. The lymph nodes and surrounding muscle are not. This surgery is often done for women with ductal carcinoma in situ, or DCIS. This is cancer that starts in the milk ducts and has not spread. This surgery is also used for women who have a breast removed so they can prevent breast cancer.  ?? In a modified radical mastectomy, the entire breast, some of the lymph nodes in the armpit, and the lining over the chest muscles are removed. The chest muscles are not. This is the most common surgery for breast cancer.  ?? In a radical mastectomy, the entire breast, all of the lymph nodes in the armpit, muscles under the chest, and some of the surrounding fatty tissue are removed. This surgery is rarely done. It is used only when a woman has many tumors and when cancer has entered the chest.  ?? In breast-conserving surgery, the tumor and some healthy breast tissue are removed. Most of the breast remains. Examples of this type of surgery include partial mastectomy, lumpectomy, and quadrantectomy. Different amounts of the breast are removed in each surgery.  ?? In a subcutaneous mastectomy, the tumor and some breast tissue are removed. The nipple, skin, lymph nodes, and chest wall muscles are not removed. This  surgery is rarely done, because some cancer cells may remain.  ?? A skin-sparing technique may be used for a simple or modified radical mastectomy. The breast tissue is removed through a tiny cut that is made around the nipple. This technique does not harm the skin. During a modified radical mastectomy, the lymph nodes are removed by making another cut in the armpit.  The type of surgery you have depends on:  ?? The tumor size, type, and location.  ?? The size of your breast.  ?? The cancer stage.  ?? Whether or not the cancer has spread to the lymph nodes.  ?? Whether or not you have had radiation treatment.  ?? Your age and health.  You and your doctor can decide which surgery is right for you.  What can you expect after surgery?  Recovery  Breast cancer surgery helps many women go on to lead normal lives. Your outcome depends on many things, especially the stage of the cancer.  You will probably be able to go back to work or your normal routine in 3 to 6 weeks. This depends on the type of work you do and any further treatment. Talk with your doctor about other treatment you may need.  Your personal preferences and considerations are important when choosing a treatment that is right for you.  Lymph  nodes  If your lymph nodes are removed, your arm may swell. This is called lymphedema. You will have to take good care of your affected arm. Do not carry heavy things with that arm. Wear loose sleeves and bracelets. Your doctor or physical therapist can teach you arm exercises that will let you move your arm as you always have.  Before you get blood pressure tests, blood draws, or shots in that arm, tell your doctor that you had lymph nodes removed.  Appearance  You will have a scar, but it will fade in time.  You have some choices in how you look. Talk to your doctor about breast forms. Ask about reconstructive surgery. This can sometimes be done at the same time as the mastectomy.  Follow-up care is a key part of your  treatment and safety. Be sure to make and go to all appointments, and call your doctor if you are having problems. It's also a good idea to know your test results and keep a list of the medicines you take.   Where can you learn more?   Go to [email or web address]  Enter [phone] in the search box to learn more about "Learning About Mastectomy."   ?? 2006-2013 [employer]. Care instructions adapted under license by [employer] (which disclaims liability or warranty for this information). This care instruction is for use with your licensed healthcare professional. If you have questions about a medical condition or this instruction, always ask your healthcare professional. [employer] disclaims any warranty or liability for your use of this information.  Content Version: 9.9.209917; Last Revised: November 14, 2011              Mastectomy: Before Your Surgery  What is a mastectomy?  A mastectomy is surgery to remove a breast. There are many ways to do it. The type of surgery you have depends on your situation and if you plan to have surgery to reconstruct the breast.  When this surgery is done to cure or prevent cancer, the entire breast is removed. This includes the nipple. During surgery, the doctor may also check your lymph nodes.  After surgery, you will probably stay overnight in the hospital. You may be able to go back to work or your normal routine in 3 to 6 weeks. It depends on the type of work you do.  When you find out that you have cancer, you may feel many emotions and may need some help coping. Seek out family, friends, and counselors for support. You also can do things at home to make yourself feel better while you go through treatment. Call the American Cancer Society (3344249261) or visit its website at www.cancer.org for more information.  Follow-up care is a key part of your treatment and safety. Be sure to make and go to all appointments, and call your doctor if  you are having problems. It's also a good idea to know your test results and keep a list of the medicines you take.  What happens before surgery?  Surgery can be stressful. This information will help you understand what you can expect. And it will help you safely prepare for surgery.  Preparing for surgery  ?? Understand exactly what surgery is planned, along with the risks, benefits, and other options.  ?? Tell your doctors ALL the medicines, vitamins, supplements, and herbal remedies you take. Some of these can increase the risk of bleeding or interact with anesthesia.  ?? If you take blood thinners, such as  warfarin (Coumadin), clopidogrel (Plavix), or aspirin, be sure to talk to your doctor. He or she will tell you if you should stop taking these medicines before your surgery. Make sure that you understand exactly what your doctor wants you to do.  ?? Your doctor will tell you which medicines to take or stop before your surgery. You may need to stop taking certain medicines a week or more before surgery. So talk to your doctor as soon as you can.  ?? If you have an advance directive, let your doctor know. It may include a living will and a durable power of attorney for health care. Bring a copy to the hospital. If you don't have one, you may want to prepare one. It lets your doctor and loved ones know your health care wishes. Doctors advise that everyone prepare these papers before any type of surgery or procedure.  What happens on the day of surgery?  ?? Follow the instructions exactly about when to stop eating and drinking. If you don't, your surgery may be canceled. If your doctor told you to take your medicines on the day of surgery, take them with only a sip of water.  ?? Take a bath or shower before you come in for your surgery. Do not apply lotions, perfumes, deodorants, or nail polish.  ?? Do not shave the surgical site yourself.  ?? Take off all jewelry and piercings. And take out contact lenses, if you wear  them.  At the hospital or surgery center  ?? Bring a picture ID.  ?? The area for surgery is often marked to make sure there are no errors.  ?? You will be kept comfortable and safe by your anesthesia provider. You will be asleep during the surgery.  ?? The surgery will take about 1 to 3 hours.  ?? You will have one or two tubes under your skin. These will drain fluid from the surgery area while you heal. The doctor will take these out 2 to 10 days after surgery.  Going home  ?? Be sure you have someone to drive you home. Anesthesia and pain medicine make it unsafe for you to drive.  ?? You will be given more specific instructions about recovering from your surgery. They will cover things like diet, wound care, follow-up care, driving, and getting back to your normal routine.  When should you call your doctor?  ?? You have questions or concerns.  ?? You don't understand how to prepare for your surgery.  ?? You become ill before the surgery (such as fever, flu, or a cold).  ?? You need to reschedule or have changed your mind about having the surgery.   Where can you learn more?   Go to [email or web address]  Enter [phone] in the search box to learn more about "Mastectomy: Before Your Surgery."   ?? 2006-2013 [employer]. Care instructions adapted under license by [employer] (which disclaims liability or warranty for this information). This care instruction is for use with your licensed healthcare professional. If you have questions about a medical condition or this instruction, always ask your healthcare professional. [employer] disclaims any warranty or liability for your use of this information.  Content Version: 9.9.209917; Last Revised: March 05, 2012              Mastectomy: What to Expect at Home  Your Recovery  Right after the surgery you will probably feel weak, and you may feel sore for 2 to 3 days.  You may have a pulling or stretching sensation near or under your arm. You may also  have itching, tingling, and throbbing in the area. This will get better in a few days.  You will probably have a plastic or rubber tube, called a drain, to collect fluid from under the affected arm on the side of the surgery. Your doctor will remove this when the fluid buildup slows. This can be in a few days or even several weeks.  You should be able to go back to work or your normal routine in 3 to 6 weeks. This depends on the type of work you do and any other treatment you may need.  When you find out that you have cancer, you may feel many emotions and may need some help coping. This is common. Seek out family, friends, and counselors for support. You also can do things at home to make yourself feel better while you go through treatment. Call the American Cancer Society [phone]) or visit its website at www.cancer.org for more information.  This care sheet gives you a general idea about how long it will take for you to recover. But each person recovers at a different pace. Follow the steps below to get better as quickly as possible.  How can you care for yourself at home?  Activity  ?? Rest when you feel tired. Getting enough sleep will help you recover. After any activity, rest and raise your affected arm for a period of time equal to your activity time.  ?? Try to walk each day. Start by walking a little more than you did the day before. Bit by bit, increase the amount you walk. Walking boosts blood flow and helps prevent pneumonia and constipation.  ?? Avoid strenuous activities, such as biking, jogging, weightlifting, or aerobic exercise, until your doctor says it is okay. This includes housework, especially if you have to use your affected arm. You will probably be able to do your normal activities in 3 to 6 weeks. Avoid repeated motions with your affected arm, such as weed pulling, window cleaning, or vacuuming, for 6 months.  ?? Avoid lifting anything over 10 to 15 pounds for 4 to 6 weeks. This may  include a child, grocery bags, a heavy briefcase or backpack, cat litter or dog food bags, or a vacuum cleaner.  ?? Ask your doctor when you can drive again.  ?? You will probably be able to go back to work or your normal routine in 3 to 6 weeks. This depends on the type of work you do and any further treatment.  ?? Wait to take a shower until your drain is removed. Your doctor may also ask you to wait to take a shower until your staples or stitches have been removed. This is usually in about 1 week. Do not take a bath for about 2 weeks, or until the wound has healed.  Diet  ?? You can eat your normal diet. If your stomach is upset, try bland, low-fat foods like plain rice, broiled chicken, toast, and yogurt.  ?? Drink plenty of fluids (unless your doctor tells you not to).  ?? You may notice that your bowels are not regular right after your surgery. This is common. Try to avoid constipation and straining with bowel movements. Take a fiber supplement such as Citrucel or Metamucil every day. If you have not had a bowel movement after a couple of days, take a mild laxative like Milk of Magnesia or a  stool softener like Colace.  Medicines  ?? Take pain medicines exactly as directed.  ?? If the doctor gave you a prescription medicine for pain, take it as prescribed.  ?? If you are not taking a prescription pain medicine, ask your doctor if you can take an over-the-counter medicine.  ?? If your doctor prescribed antibiotics, take them as directed. Do not stop taking them just because you feel better. You need to take the full course of antibiotics.  ?? If you think your pain medicine is making you sick to your stomach:  ?? Take your medicine after meals (unless your doctor has told you not to).  ?? Ask your doctor for a different pain medicine.  Incision care  ?? You will have a dressing over the cut (incision). A dressing helps the incision heal and protects it. Your doctor will tell you how to take care of this.  ?? You may be  wearing a special bra (surgi-bra) that holds your dressing in place after the surgery. Your doctor will tell you when you can stop wearing the bra.  Exercise  ?? If you had any lymph nodes removed from under your arm, your doctor will advise you to do arm exercises. Do not do the exercises until your doctor says it is okay.  Ice and elevation  ?? Do not use ice for swelling or pain.  ?? Prop up your arm on a pillow when you sit or lie down. Try to keep your arm above the level of your heart. This will help reduce swelling.  Other instructions  ?? You may have one or more drains in your surgery site. Your doctor will tell you how to take care of them.  ?? Be sure to continue breast self-exams on your other breast.  ?? If you had a mastectomy, take precautions to prevent swelling in your arm. This is called lymphedema.  ?? Wear gloves when you garden, handle garbage, wash dishes, and clean house.  ?? Protect your hands and arms from burns, including sunburns.  ?? Do not wear tight sleeves, elastic cuffs, bracelets, wristwatches, or rings on the affected arm.  ?? Do not let anyone take blood pressure, draw blood, or give shots in that arm.  ?? Do not carry heavy purses, suitcases, grocery bags, and other heavy items with that arm. Keep the skin of that arm well moisturized.  ?? Do not cut your cuticles.  ?? Use an electric shaver if you shave your armpits.  ?? Avoid insect bites.  ?? Wear medical alert jewelry that says you can develop lymphedema. You can buy this at most drugstores and on the Internet.  Follow-up care is a key part of your treatment and safety. Be sure to make and go to all appointments, and call your doctor if you are having problems. It???s also a good idea to know your test results and keep a list of the medicines you take.  When should you call for help?  Call 911 anytime you think you may need emergency care. For example, call if:  ?? You passed out (lost consciousness).  ?? You have severe trouble breathing.  ??  You have sudden chest pain and shortness of breath, or you cough up blood.  Call your doctor now or seek immediate medical care if:  ?? You have signs of lymphedema.  ?? You have a feeling of tightness or swelling in or around your arm.  ?? You have pain, weakness that keeps getting  worse, or a tingling "pins and needles" feeling.  ?? Your arm feels full or heavy.  ?? You notice that your hand or wrist is becoming stiff and hard to move.  ?? You notice swelling in your fingers.  ?? Fluid collects under the skin of the surgery site.  ?? Fluid is leaking around the drain, you have a sudden increase in fluid, or you have no new fluid in the drain for 24 hours.  ?? You have sudden swelling of your arm, hands, or fingers.  ?? You have pain that does not go away when you take your pain medicine.  ?? You have loose stitches, your incision comes open, or the skin around the incision turns dark, purple or black.  ?? Bright red blood has soaked through your bandage.  ?? You have signs of infection, such as:  ?? Increased pain, swelling, warmth, or redness.  ?? Red streaks leading from the incision.  ?? Pus draining from the incision.  ?? A fever.  Watch closely for changes in your health, and be sure to contact your doctor if:  ?? You feel any changes in your other breast during breast self-exams. This includes lumps, skin changes, or nipple drainage.  ?? Your rings, watches, or bracelets feel tight, but you have not gained weight.  ?? You feel anxious or depressed, or have trouble sleeping.  ?? You do not have a bowel movement after taking a laxative.   Where can you learn more?   Go to [email or web address]  Enter S340 in the search box to learn more about "Mastectomy: What to Expect at Home."   ?? 2006-2013 [employer]. Care instructions adapted under license by [employer] (which disclaims liability or warranty for this information). This care instruction is for use with your licensed healthcare professional. If you  have questions about a medical condition or this instruction, always ask your healthcare professional. [employer] disclaims any warranty or liability for your use of this information.  Content Version: 9.9.209917; Last Revised: December 29, 2010              Implanted Port: What to Expect at Home  Your Recovery  You have had a procedure to implant a port. The port looks like a small bump under your skin. A thin, flexible tube called a catheter runs under the skin from the port into a large vein.  You may have the port for weeks, months, or longer. You will be able to get medicine, blood, nutrients, or other fluids with more comfort. The port can be used right away.  You will probably have some discomfort and bruising at the port site. This will go away in a few days.  You may have strips of tape on the cut (incision) the doctor made, or the cut may have been closed with glue. It may be covered with a small bandage.  This care sheet gives you a general idea about how long it will take for you to recover. But each person recovers at a different pace. Follow the steps below to feel better as quickly as possible.  How can you care for yourself at home?  Activity  ?? Avoid arm and upper body movements that may pull on the catheter. These movements include heavy weight lifting and vigorous use of your arms.  ?? You will probably need to take 1 day off from work and will be able to return to normal activities shortly after. This depends on the type  of work you do, why you have the catheter, and how you feel.  ?? Ask your doctor when you can drive again. Pay special attention when pulling your seat belt across your chest so it doesn't pull out the catheter. It's okay if the seat belt lays over the catheter.  Medicines  ?? Take pain medicines exactly as directed.  ?? If the doctor gave you a prescription medicine for pain, take it as prescribed.  ?? If you are not taking a prescription pain medicine, ask your doctor if  you can take an over-the-counter medicine.  ?? If you think your pain medicine is making you sick to your stomach:  ?? Take your medicine after meals (unless your doctor has told you not to).  ?? Ask your doctor for a different pain medicine.  Incision care  ?? If you have a bandage, your doctor will tell you when you can remove it. After you remove the bandage, you may shower. Wash the area with soap and water and pat it dry. Don't use hydrogen peroxide or alcohol, which can slow healing. You may cover the area with a gauze bandage if it weeps or rubs against clothing. Change the bandage every day.  ?? If you have strips of tape on the cut (incision) the doctor made, leave the tape on for a week or until it falls off.  Other instructions  ?? Always carry the medical alert card that your doctor gives you. It contains information about your port. It will tell health care workers you have a port in case you need emergency care.  ?? Wear loose clothing over the port for the first 10 to 14 days. When getting dressed, be careful not to rub the port.  Follow-up care is a key part of your treatment and safety. Be sure to make and go to all appointments, and call your doctor if you are having problems. It's also a good idea to know your test results and keep a list of the medicines you take.  When should you call for help?  Call 911 anytime you think you may need emergency care. For example, call if:  ?? You passed out (lost consciousness).  ?? You have severe trouble breathing.  ?? You have sudden chest pain and shortness of breath, or you cough up blood.  Call your doctor now or seek immediate medical care if:  ?? You have signs of infection, such as:  ?? Increased pain, swelling, warmth, or redness near the port.  ?? Red streaks leading from the port.  ?? Pus draining from the port.  ?? A fever.  ?? You have pain or swelling in your neck or arm.  ?? You have trouble breathing or chest pain.  Watch closely for changes in your health, and  be sure to contact your doctor if:  ?? You have any problems with your port.   Where can you learn more?   Go to [email or web address]  Enter M256 in the search box to learn more about "Implanted Port: What to Expect at Home."   ?? 2006-2013 [employer]. Care instructions adapted under license by [employer] (which disclaims liability or warranty for this information). This care instruction is for use with your licensed healthcare professional. If you have questions about a medical condition or this instruction, always ask your healthcare professional. [employer] disclaims any warranty or liability for your use of this information.  Content Version: 9.9.209917; Last Revised: January 18, 2011  Implanted Port: Before Your Procedure  What is an implanted port?  An implanted port is a device placed, in most cases, under the skin of your chest below your collarbone. It is made of plastic, stainless steel, or titanium. The port is usually about the size of a quarter, but thicker. A thin, flexible tube called a catheter runs under the skin from the port into a large vein. A silicone bubble (septum) is in the center of the port.  An implanted port is a type of central venous catheter, or central venous line.  The port is used to give you medicine, blood products, nutrients, or fluids over a long period of time. You may have it for weeks, months, or longer. The port also can be used to draw blood for tests. The port makes doing these things more comfortable for you.  A needle is used to put fluid into the port. You will only feel a mild prick. Some implanted ports contain a small reservoir that can be filled with the medicine or fluid. The reservoir slowly releases the medicine into the bloodstream. A special needle (called a [name] needle) may stay in the port for a short time.  Your doctor will give you medicine to make you sleep or feel relaxed. Your doctor will then thread  the catheter up a vein in your neck or chest to a larger vein and put the port in. The port will remain just under your skin. It looks like a small bump under the skin.  Follow-up care is a key part of your treatment and safety. Be sure to make and go to all appointments, and call your doctor if you are having problems. It's also a good idea to know your test results and keep a list of the medicines you take.  What happens before the procedure?  Having a procedure can be stressful. This information will help you understand what you can expect and how to safely prepare for your procedure.  Preparing for the procedure  ?? Bring a list of questions to ask your doctors. It is important that you understand exactly what procedure is planned, the risks, benefits, and other options before your procedure.  ?? Tell your doctors ALL the medicines, vitamins, supplements, and herbal remedies you take. Some of these can increase the risk of bleeding or interact with anesthesia. Your doctor will tell you which medicines to take or stop before your procedure.  ?? If you take blood thinners, such as warfarin (Coumadin), clopidogrel (Plavix), or aspirin, be sure to talk to your doctor. He or she will tell you if you should stop taking these medicines before your procedure. Make sure that you understand exactly what your doctor wants you to do.  ?? You may need to stop taking certain medicines a week or more before your procedure, so talk to your doctor as soon as you can.  ?? Before your procedure, you may speak with an anesthesia provider to discuss your anesthetic options, including the risks, benefits, and alternatives to each. This may be on the phone or in person.  Taking care of yourself before the procedure  ?? Build healthy habits into your life. Changes are best made several weeks before the procedure, since your body may react to sudden changes in your habits.  ?? Stay as active as you can.  ?? Eat a healthy diet.  ?? Cut back or  quit alcohol and tobacco.  ?? If you have an advance directive???which may  include a living will and a durable power of attorney for health care???let your doctor know. If you do not have one, you may want to prepare one so your doctor and loved ones know your health care wishes. Doctors recommend that everyone prepare these papers before a procedure, regardless of the type of procedure or condition.  What happens on the day of the procedure?  ?? Follow the instructions exactly about when to stop eating and drinking, or your procedure may be canceled. If your doctor has instructed you to take your medicines on the day of the procedure, please do so using only a sip of water.  ?? Take a bath or shower before you come in for your procedure. Do not apply lotions, perfumes, deodorants, or nail polish.  ?? Remove all jewelry, piercings, and contact lenses.  ?? Leave your valuables at home.  At the hospital or surgery center  ?? Bring a picture ID.  ?? A small tube (IV) may be placed in a vein, to give you fluids and medicine to help you relax.  ?? You will be kept comfortable and safe by your anesthesia provider. The anesthesia may range from making you fully asleep, to simply numbing the area being worked on. This will depend on the procedure you are having, as well as a discussion between your doctor, the anesthesia provider, and you.  ?? The procedure will take about 1 hour.  ?? As you wake up in the recovery room, the nurse will check to be sure you are stable and comfortable. It is important for you to tell your doctor and nurse how you feel and ask questions about any concerns you may have.  Going home  ?? Be sure you have someone to drive you home.  ?? For your safety, you should not drive until you are no longer taking pain medicines, and you can move and react easily.  ?? You will be told how to use and care for the implanted port.  ?? You will be given more specific instructions about recovering from your procedure, including  activity and when you may return to work.  When should you call your doctor?  ?? You have questions or concerns.  ?? You don't understand how to prepare for your procedure.  ?? You become ill before the procedure (such as fever, flu, or a cold).  ?? You need to reschedule or have changed your mind about having the procedure.   Where can you learn more?   Go to [email or web address]  Enter L362 in the search box to learn more about "Implanted Port: Before Your Procedure."   ?? 2006-2013 [employer]. Care instructions adapted under license by [employer] (which disclaims liability or warranty for this information). This care instruction is for use with your licensed healthcare professional. If you have questions about a medical condition or this instruction, always ask your healthcare professional. [employer] disclaims any warranty or liability for your use of this information.  Content Version: 9.9.209917; Last Revised: January 18, 2011              Breast Reconstruction With Expander or Implant: Before Your Surgery  What is breast reconstruction with an expander or implant?  Breast reconstruction is a type of surgery. It rebuilds your breast after you've had part or all of a breast removed. It is often done for women who have cancer. But women who have problems with breast development can also have this surgery. It usually takes  more than one surgery to rebuild a breast. You and your doctor will plan your surgery or surgeries based on your wishes and your health.  In this type of surgery, the doctor uses a device called an implant. The implant gives your breast its shape. The nipple and the brown area around it (areola) are usually created later.  In some cases, a balloon is used to stretch your skin and muscle before the implant is put in. The balloon is called a tissue expander. This stretching happens over a period of months in your doctor's office. Every 1 to 2 weeks, the  balloon is expanded with a little more salt water.  You will probably be asleep during the surgery. And you may get a medicine that numbs the breast area. The doctor will do his or her best to make cuts in places on your body that won't be seen. These cuts are called incisions. Sometimes the doctor uses the same incisions that were used to remove the cancer. The incisions leave scars that fade with time.  After surgery, you will probably go home the same day or the next day. Many women can go back to work or their normal routine in 3 to 6 weeks. But it depends on the type of work you do.  It's important to know that your breasts will look different after surgery. Your new breast may be more firm, round, or flat than your other breast. It may also not look the same as the breast that was removed. Some women have surgery on the other breast to make them look as alike as possible.  Follow-up care is a key part of your treatment and safety. Be sure to make and go to all appointments, and call your doctor if you are having problems. It's also a good idea to know your test results and keep a list of the medicines you take.  What happens before surgery?  Surgery can be stressful. This information will help you understand what you can expect. And it will help you safely prepare for surgery.  Preparing for surgery  ?? Understand exactly what surgery is planned, along with the risks, benefits, and other options.  ?? Tell your doctors ALL the medicines, vitamins, supplements, and herbal remedies you take. Some of these can increase the risk of bleeding or interact with anesthesia.  ?? If you take blood thinners, such as warfarin (Coumadin), clopidogrel (Plavix), or aspirin, be sure to talk to your doctor. He or she will tell you if you should stop taking these medicines before your surgery. Make sure that you understand exactly what your doctor wants you to do.  ?? Your doctor will tell you which medicines to take or stop before  your surgery. You may need to stop taking certain medicines a week or more before surgery. So talk to your doctor as soon as you can.  ?? If you have an advance directive, let your doctor know. It may include a living will and a durable power of attorney for health care. Bring a copy to the hospital. If you don't have one, you may want to prepare one. It lets your doctor and loved ones know your health care wishes. Doctors advise that everyone prepare these papers before any type of surgery or procedure.  What happens on the day of surgery?  ?? Follow the instructions exactly about when to stop eating and drinking. If you don't, your surgery may be canceled. If your doctor told you  to take your medicines on the day of surgery, take them with only a sip of water.  ?? Take a bath or shower before you come in for your surgery. Do not apply lotions, perfumes, deodorants, or nail polish.  ?? Do not shave the surgical site yourself.  ?? Take off all jewelry and piercings. And take out contact lenses, if you wear them.  At the hospital or surgery center  ?? Bring a picture ID.  ?? The area for surgery is often marked to make sure there are no errors.  ?? You will be kept comfortable and safe by your anesthesia provider. You will be asleep during the surgery.  ?? The surgery will take 1 to 2 hours.  Going home  ?? Be sure you have someone to drive you home. Anesthesia and pain medicine make it unsafe for you to drive.  ?? You will be given more specific instructions about recovering from your surgery. They will cover things like diet, wound care, follow-up care, driving, and getting back to your normal routine.  When should you call your doctor?  ?? You have questions or concerns.  ?? You don't understand how to prepare for your surgery.  ?? You become ill before the surgery (such as fever, flu, or a cold).  ?? You need to reschedule or have changed your mind about having the surgery.   Where can you learn more?   Go to  [email or web address]  Enter V966 in the search box to learn more about "Breast Reconstruction With Expander or Implant: Before Your Surgery."   ?? 2006-2013 [employer]. Care instructions adapted under license by [employer] (which disclaims liability or warranty for this information). This care instruction is for use with your licensed healthcare professional. If you have questions about a medical condition or this instruction, always ask your healthcare professional. [employer] disclaims any warranty or liability for your use of this information.  Content Version: 9.9.209917; Last Revised: March 05, 2012              Breast Reconstruction With Expander or Implant: What to Expect at Home  Your Recovery  Breast reconstruction is surgery to rebuild the shape of your breast after you have had part or all of your breast removed because of cancer. It may also be done for women who have problems with breast development. Right after the surgery you will probably feel weak, and you may feel pain for 2 to 3 weeks. You may have a pulling or stretching feeling in your breast area. You can expect to feel better and stronger each day, although you may need pain medicine for a week or two. You may get tired easily or have less energy than usual. This may last for several weeks after surgery.  Stitches usually are removed in 5 to 10 days.  Your new breast may feel firmer and look rounder or flatter than your other breast. The new breast may not have the same shape as your breast did before it was removed. Breast reconstruction cannot restore normal feeling to your breast, but in time, some feeling may return. It may take several months for your breast to heal.  You may have surgery to create a nipple and the brown area around it a few months after your breast is reconstructed. Breast reconstruction can improve your appearance and renew your self-confidence. Keep in mind that it may take  time to get used to your new breast. You may want to  talk with a counselor if you need more support as you get used to your new appearance.  This care sheet gives you a general idea about how long it will take for you to recover. But each person recovers at a different pace. Follow the steps below to get better as quickly as possible.  How can you care for yourself at home?  Activity  ?? Rest when you feel tired. Getting enough sleep will help you recover.  ?? For about 6 weeks after surgery, avoid lifting anything that would make you strain. This may include a child, heavy grocery bags, milk containers, a heavy briefcase or backpack, cat litter or dog food bags, a vacuum cleaner, or anything that weighs more than 10 to 15 pounds. Do not lift anything over your head for 3 to 6 weeks.  ?? You may have pain for a few weeks after surgery. Support the area with your hands or a firm pillow when you bend, cough, or move.  ?? Ask your doctor when you can drive again.  ?? Ask your doctor when it is okay for you to have sex.  ?? You can take your first shower the day after the drain near your incision is removed. This is usually in about 1 week. Do not take a bath or soak in a hot tub for about 4 weeks.  ?? You will probably be able to go back to work or your normal routine in 3 to 6 weeks. This depends on the type of work you do and any further treatment.  Diet  ?? You can eat your normal diet. If your stomach is upset, try bland, low-fat foods like plain rice, broiled chicken, toast, and yogurt.  ?? Drink plenty of fluids (unless your doctor tells you not to).  ?? You may notice that your bowel movements are not regular right after your surgery. This is common. Try to avoid constipation and straining with bowel movements. You may want to take a fiber supplement. If you have not had a bowel movement after a couple of days, ask your doctor about taking a mild laxative.  Medicines  ?? Take pain medicines exactly as directed.  ?? If the  doctor gave you a prescription medicine for pain, take it as prescribed.  ?? If you are not taking a prescription pain medicine, ask your doctor if you can take an over-the-counter medicine.  ?? If you think your pain medicine is making you sick to your stomach:  ?? Take your medicine after meals (unless your doctor has told you not to).  ?? Ask your doctor for a different pain medicine.  If you were given medicine for nausea, take it as directed.  ?? If your doctor prescribed antibiotics, take them as directed. Do not stop taking them just because you feel better. You need to take the full course of antibiotics.  Incision care  ?? If your doctor gave you specific instructions on how to care for your incision, follow those instructions.  ?? You may be wearing a special bra that holds your bandages in place after the surgery. Your doctor will tell you when you can stop wearing the bra. Your doctor may want you to wear the bra at night as well as during the day for several weeks. Do not wear an underwire bra for 1 month.  ?? If you have strips of tape on the incision, leave the tape on for a week or until it falls off.  ??  Wash the area daily with warm, soapy water, and pat it dry. Don't use hydrogen peroxide or alcohol, which can slow healing.  ?? You may cover the area with a gauze bandage if it weeps or rubs against clothing. Change the bandage 1 to 2 times every day. Consider having someone help you with this.  ?? Keep the area clean and dry.  Exercise  ?? Try to walk each day. Start by walking a little more than you did the day before. Bit by bit, increase the amount you walk. Walking boosts blood flow and helps prevent pneumonia, constipation, and blood clots in your legs.  ?? Avoid strenuous activities, such as bicycle riding, jogging, weight lifting, or aerobic exercise, until your doctor says it is okay. This includes housework, especially if you have to use the arm on the side of your surgery.  ?? Your doctor will tell  you when to begin stretching exercises and normal activities.  Ice and elevation  ?? To reduce swelling and pain, put ice or a cold pack over your breast for 10 to 20 minutes at a time. Put a thin cloth between the ice and your skin.  ?? Prop up the arm on the side of your surgery on a pillow when you sit or lie down. Try to keep your arm above the level of your heart. This will help reduce swelling.  Other instructions  ?? You may have one or more drains near your incision. Your doctor will tell you how to take care of them. Drains are usually removed in the first week after surgery.  Follow-up care is a key part of your treatment and safety. Be sure to make and go to all appointments, and call your doctor if you are having problems. It???s also a good idea to know your test results and keep a list of the medicines you take.  When should you call for help?  Call 911 anytime you think you may need emergency care. For example, call if:  ?? You passed out (lost consciousness).  ?? You have sudden chest pain and shortness of breath, or you cough up blood.  Call your doctor now or seek immediate medical care if:  ?? You have severe pain in your breast area.  ?? You have fluid under the skin or a lot of swelling at the surgery site.  ?? You are sick to your stomach or cannot keep fluids down.  ?? You have pain that does not get better after you take pain medicine.  ?? You have loose stitches, or your incision comes open.  ?? You bleed through a large bandage over your incision.  ?? You develop a cough.  ?? You have signs of infection, such as:  ?? Increased pain, swelling, warmth, or redness.  ?? Red streaks leading from the incision.  ?? Pus draining from the incision.  ?? A fever.  ?? You have signs of a blood clot, such as:  ?? Pain in your calf, back of the knee, thigh, or groin.  ?? Redness and swelling in your leg or groin.  Watch closely for any changes in your health, and be sure to contact your doctor if:  ?? Your swelling or pain is  getting worse.  ?? Your breast with an implant gets hard or hurts, or the shape changes.  ?? You do not have a bowel movement after taking a laxative or using a suppository.  ?? You feel anxious or depressed or have  trouble sleeping.  ?? You are not getting better as expected.   Where can you learn more?   Go to [email or web address]  Enter [phone] in the search box to learn more about "Breast Reconstruction With Expander or Implant: What to Expect at Home."   ?? 2006-2013 [employer]. Care instructions adapted under license by [employer] (which disclaims liability or warranty for this information). This care instruction is for use with your licensed healthcare professional. If you have questions about a medical condition or this instruction, always ask your healthcare professional. [employer] Inc[employer] disclaims any warranty or liability for your use of this information.  Content Version: 9.9.209917; Last Revised: May 08, 2012

## 2012-11-13 NOTE — Telephone Encounter (Signed)
Patient had many pre-op questions.  Wondered about JP drain care, how many drains she might have.  I went over all of this with her.  Wanted to know how long she would be in the hospital, when she needed to come back to see Dr. Maxwell Marion.  Would like Rx for wig, and I will send this to her.  Also would like her Rx for her pain medication prior to her surgery, and I will have Dr. Maxwell Marion write this out tomorrow and mail it to her.  Asked about PAT and what they would do, and I reviewed all of this with her.  She felt like all of her questions were answered, and she was feeling a little less anxious.    I am going to mail her the prescriptions, a handout on JP care, a urine cup for measurement of drainage, drain pouches and a camisole.  I asked her to call me if she had any further questions.

## 2012-11-14 NOTE — Progress Notes (Signed)
Mailed Rx's for wigs and pain med for post-op, urine container for measuring, drain pouches, JP drain care sheet and camisole to patient's home address.

## 2012-11-21 ENCOUNTER — Encounter

## 2012-11-21 LAB — METABOLIC PANEL, COMPREHENSIVE
A-G Ratio: 1.5 (ref 1.1–2.2)
ALT (SGPT): 41 U/L (ref 12–78)
AST (SGOT): 23 U/L (ref 15–37)
Albumin: 4.2 g/dL (ref 3.5–5.0)
Alk. phosphatase: 93 U/L (ref 50–136)
Anion gap: 5 mmol/L (ref 5–15)
BUN/Creatinine ratio: 33 — ABNORMAL HIGH (ref 12–20)
BUN: 12 MG/DL (ref 6–20)
Bilirubin, total: 0.3 MG/DL (ref 0.2–1.0)
CO2: 31 MMOL/L (ref 21–32)
Calcium: 9 MG/DL (ref 8.5–10.1)
Chloride: 103 MMOL/L (ref 97–108)
Creatinine: 0.36 MG/DL — ABNORMAL LOW (ref 0.45–1.15)
GFR est AA: >60 mL/min/{1.73_m2} (ref >60)
GFR est non-AA: >60 mL/min/{1.73_m2} (ref >60)
Globulin: 2.8 g/dL (ref 2.0–4.0)
Glucose: 78 MG/DL (ref 65–100)
Potassium: 4.3 MMOL/L (ref 3.5–5.1)
Protein, total: 7 g/dL (ref 6.4–8.2)
Sodium: 139 MMOL/L (ref 136–145)

## 2012-11-21 LAB — CBC WITH AUTOMATED DIFF
ABS. BASOPHILS: 0 10*3/uL (ref 0.0–0.1)
ABS. EOSINOPHILS: 0.2 10*3/uL (ref 0.0–0.4)
ABS. LYMPHOCYTES: 2.6 10*3/uL (ref 0.8–3.5)
ABS. MONOCYTES: 0.5 10*3/uL (ref 0.0–1.0)
ABS. NEUTROPHILS: 4.3 10*3/uL (ref 1.8–8.0)
BASOPHILS: 0 % (ref 0–1)
EOSINOPHILS: 3 % (ref 0–7)
HCT: 38.3 % (ref 35.0–47.0)
HGB: 12.8 g/dL (ref 11.5–16.0)
LYMPHOCYTES: 34 % (ref 12–49)
MCH: 29.4 PG (ref 26.0–34.0)
MCHC: 33.4 g/dL (ref 30.0–36.5)
MCV: 87.8 FL (ref 80.0–99.0)
MONOCYTES: 7 % (ref 5–13)
NEUTROPHILS: 56 % (ref 32–75)
PLATELET: 257 10*3/uL (ref 150–400)
RBC: 4.36 M/uL (ref 3.80–5.20)
RDW: 12.8 % (ref 11.5–14.5)
WBC: 7.7 10*3/uL (ref 3.6–11.0)

## 2012-11-21 NOTE — Telephone Encounter (Signed)
L/M for me that she was looking for results of testing that had been performed.  She thought that this was BRCA testing.    L/M for the patient that the testing was not back yet ([employer]), and asked her to return my call if she had further questions.

## 2012-11-21 NOTE — Other (Signed)
Patient given surgical site infection information FAQs handout and hand hygiene tips sheet.Pre-operative instructions reviewed and patient verbalizes understanding of instructions. Patient has been given the opportunity to ask additional questions.

## 2012-11-21 NOTE — Other (Signed)
PATIENT STATES SHE IS TO HAVE A PORTACATH INSERTION BUT CONSENT DOES NOT INCLUDE THAT.  KATHY CHILDERS WAS CALLED IN DR PELLICAN'SE OFFICE AND SHE STATES SHE WILL FOLLOWUP SO CONSENT WILL INCLUDE PORTACATH INSERTION.

## 2012-11-21 NOTE — Other (Signed)
PATIENT STATES SHE ALREADY HAS '[facility]OUR [facility] TO HEALING' BOOKLET

## 2012-11-22 LAB — EKG, 12 LEAD, INITIAL
Atrial Rate: 66 {beats}/min
Calculated P Axis: 75 degrees
Calculated R Axis: 76 degrees
Calculated T Axis: 72 degrees
Diagnosis: NORMAL
P-R Interval: 166 ms
Q-T Interval: 400 ms
QRS Duration: 82 ms
QTC Calculation (Bezet): 419 ms
Ventricular Rate: 66 {beats}/min

## 2012-11-22 NOTE — Addendum Note (Signed)
Addended by: Binnie Rail on: 11/22/2012 04:20 PM     Modules accepted: Orders

## 2012-11-26 NOTE — Other (Signed)
Dr Lloyd Huger and oked ekg for surgery.4 pm  11/26/12

## 2012-11-28 ENCOUNTER — Inpatient Hospital Stay
Admit: 2012-11-28 | Discharge: 2012-11-30 | Disposition: A | Discharge status: Home / Self Care | Payer: BLUE CROSS/BLUE SHIELD | Source: Ambulatory Visit | Attending: Plastic Surgery | Admitting: Plastic Surgery

## 2012-11-28 DIAGNOSIS — C50919 Malignant neoplasm of unspecified site of unspecified female breast: Secondary | ICD-10-CM

## 2012-11-28 MED ADMIN — neostigmine (PROSTIGMINE) injection: INTRAVENOUS | @ 15:00:00 | NDC 00517003325

## 2012-11-28 MED ADMIN — lactated ringers infusion: INTRAVENOUS | @ 18:00:00 | NDC 00409795309

## 2012-11-28 MED ADMIN — bupivacaine liposome (PF) susp (EXPAREL) infiltration: SUBCUTANEOUS | @ 15:00:00 | NDC 65250026620

## 2012-11-28 MED ADMIN — HYDROmorphone (PF) (DILAUDID) injection: INTRAVENOUS | @ 16:00:00 | NDC 00409131236

## 2012-11-28 MED ADMIN — HYDROmorphone (PF) (DILAUDID) injection: INTRAVENOUS | @ 16:00:00 | NDC 59011044225

## 2012-11-28 MED ADMIN — lidocaine (PF) (XYLOCAINE) 20 mg/mL (2 %) injection: INTRAVENOUS | @ 13:00:00 | NDC 63323049505

## 2012-11-28 MED ADMIN — lactated ringers infusion: INTRAVENOUS | @ 23:00:00 | NDC 00409795309

## 2012-11-28 MED ADMIN — glycopyrrolate (ROBINUL) injection: INTRAMUSCULAR | @ 16:00:00 | NDC 10019001639

## 2012-11-28 MED ADMIN — midazolam (VERSED) injection 0.5 mg: INTRAVENOUS | @ 05:00:00 | NDC 63323041112

## 2012-11-28 MED ADMIN — sodium chloride 0.9 % injection: @ 19:00:00 | NDC 87701099893

## 2012-11-28 MED ADMIN — lactated ringers infusion: INTRAVENOUS | @ 15:00:00 | NDC 00338011704

## 2012-11-28 MED ADMIN — rocuronium (ZEMURON) injection: INTRAVENOUS | @ 13:00:00 | NDC 10139023505

## 2012-11-28 MED ADMIN — acetaminophen (OFIRMEV) infusion: INTRAVENOUS | @ 15:00:00 | NDC 43825010201

## 2012-11-28 MED ADMIN — ondansetron (ZOFRAN) injection: INTRAVENOUS | @ 15:00:00 | NDC 00143989105

## 2012-11-28 MED ADMIN — clindamycin (CLEOCIN) 600mg D5W 50mL IVPB (premix): INTRAVENOUS | @ 18:00:00 | NDC 00781328991

## 2012-11-28 MED ADMIN — glycopyrrolate (ROBINUL) injection: INTRAMUSCULAR | @ 15:00:00 | NDC 10019001639

## 2012-11-28 MED ADMIN — PHENYLephrine (NEOSYNEPHRINE) 10 mg in 0.9% sodium chloride 250 mL infusion: INTRAVENOUS | @ 14:00:00 | NDC 10019016339

## 2012-11-28 MED ADMIN — fentaNYL citrate (PF) injection 25 mcg: INTRAVENOUS | @ 16:00:00 | NDC 00409909332

## 2012-11-28 MED ADMIN — scopolamine (TRANSDERM-SCOP): TRANSDERMAL | @ 13:00:00 | NDC 10019055388

## 2012-11-28 MED ADMIN — HYDROmorphone (PF) (DILAUDID) injection 1 mg: INTRAVENOUS | @ 18:00:00 | NDC 00409255201

## 2012-11-28 MED ADMIN — bupivacaine 0.25% -EPINEPHrine 1:200,000 (SENSORCAINE) 0.25 %-1:200,000 injection: SUBCUTANEOUS | @ 15:00:00 | NDC 63323046830

## 2012-11-28 MED ADMIN — heparin (porcine) pf: @ 14:00:00

## 2012-11-28 MED ADMIN — oxyCODONE-acetaminophen (PERCOCET) 5-325 mg per tablet 2 Tab: ORAL | @ 23:00:00 | NDC 00054865024

## 2012-11-28 MED ADMIN — HYDROmorphone (PF) (DILAUDID) injection 1 mg: INTRAVENOUS | @ 22:00:00 | NDC 00409255201

## 2012-11-28 MED ADMIN — midazolam (VERSED) injection: INTRAVENOUS | @ 13:00:00 | NDC 10019002837

## 2012-11-28 MED ADMIN — fentaNYL citrate (PF) injection: INTRAVENOUS | @ 16:00:00 | NDC 00409909332

## 2012-11-28 MED ADMIN — midazolam (VERSED) injection 0.5 mg: INTRAVENOUS | @ 17:00:00 | NDC 63323041112

## 2012-11-28 MED ADMIN — lactated ringers infusion: INTRAVENOUS | @ 13:00:00 | NDC 00338011704

## 2012-11-28 MED ADMIN — propofol (DIPRIVAN) 10 mg/mL injection: INTRAVENOUS | @ 13:00:00 | NDC 63323026950

## 2012-11-28 MED ADMIN — fentaNYL citrate (PF) injection 25 mcg: INTRAVENOUS | @ 17:00:00 | NDC 00409909332

## 2012-11-28 MED ADMIN — midazolam (VERSED) injection 0.5 mg: INTRAVENOUS | @ 16:00:00 | NDC 63323041112

## 2012-11-28 MED ADMIN — fentaNYL citrate (PF) injection: INTRAVENOUS | @ 13:00:00 | NDC 00409909332

## 2012-11-28 MED ADMIN — HYDROmorphone (PF) (DILAUDID) injection: INTRAVENOUS | @ 15:00:00 | NDC 59011044225

## 2012-11-28 MED ADMIN — PHENYLephrine (NEOSYNEPHRINE) 10 mg in 0.9% sodium chloride 250 mL infusion: INTRAVENOUS | @ 14:00:00 | NDC 60432001130

## 2012-11-28 MED ADMIN — PHENYLephrine (NEOSYNEPHRINE) 10 mg in 0.9% sodium chloride 250 mL infusion: INTRAVENOUS | @ 13:00:00 | NDC 10019016339

## 2012-11-28 MED ADMIN — sodium chloride (NS) flush 5-10 mL: INTRAVENOUS | @ 19:00:00 | NDC 87701099893

## 2012-11-28 MED ADMIN — scopolamine (TRANSDERM-SCOP): TRANSDERMAL | @ 14:00:00 | NDC 72162141902

## 2012-11-28 MED ADMIN — bupivacaine-EPINEPHrine (PF) (SENSORCAINE PF) 0.5 %-1:200,000 injection: SUBCUTANEOUS | @ 14:00:00 | NDC 63323046231

## 2012-11-28 MED FILL — OFIRMEV 1,000 MG/100 ML (10 MG/ML) INTRAVENOUS SOLUTION: 1000 mg/100 mL (10 mg/mL) | INTRAVENOUS | Qty: 100

## 2012-11-28 MED FILL — ROCURONIUM 10 MG/ML IV: 10 mg/mL | INTRAVENOUS | Qty: 70

## 2012-11-28 MED FILL — ONDANSETRON (PF) 4 MG/2 ML INJECTION: 4 mg/2 mL | INTRAMUSCULAR | Qty: 2

## 2012-11-28 MED FILL — LACTATED RINGERS IV: INTRAVENOUS | Qty: 1100

## 2012-11-28 MED FILL — HYDROMORPHONE (PF) 1 MG/ML IJ SOLN: 1 mg/mL | INTRAMUSCULAR | Qty: 1

## 2012-11-28 MED FILL — BD POSIFLUSH NORMAL SALINE 0.9 % INJECTION SYRINGE: INTRAMUSCULAR | Qty: 10

## 2012-11-28 MED FILL — XYLOCAINE-MPF 20 MG/ML (2 %) INJECTION SOLUTION: 20 mg/mL (2 %) | INTRAMUSCULAR | Qty: 40

## 2012-11-28 MED FILL — TRANSDERM-SCOP 1 MG OVER 3 DAYS TRANSDERMAL PATCH: 1 mg over 3 days | TRANSDERMAL | Qty: 1.5

## 2012-11-28 MED FILL — NEOSTIGMINE METHYLSULFATE 1 MG/ML INJECTION: 1 mg/mL | INTRAMUSCULAR | Qty: 2

## 2012-11-28 MED FILL — DIPRIVAN 10 MG/ML INTRAVENOUS EMULSION: 10 mg/mL | INTRAVENOUS | Qty: 20

## 2012-11-28 MED FILL — PHENYLEPHRINE 10 MG/ML INJECTION: 10 mg/mL | INTRAMUSCULAR | Qty: 1

## 2012-11-28 MED FILL — SODIUM CHLORIDE 0.9 % IV: INTRAVENOUS | Qty: 1000

## 2012-11-28 MED FILL — GLYCOPYRROLATE 0.2 MG/ML IJ SOLN: 0.2 mg/mL | INTRAMUSCULAR | Qty: 0.3

## 2012-11-28 MED FILL — BUPIVACAINE-EPINEPHRINE (PF) 0.5 %-1:200,000 IJ SOLN: 0.5 %-1:200,000 | INTRAMUSCULAR | Qty: 10

## 2012-11-28 MED FILL — XYLOCAINE-MPF 10 MG/ML (1 %) INJECTION SOLUTION: 10 mg/mL (1 %) | INTRAMUSCULAR | Qty: 5

## 2012-11-28 MED FILL — DEXTROSE 5%-LACTATED RINGERS IV: INTRAVENOUS | Qty: 1000

## 2012-11-28 MED FILL — OXYCODONE-ACETAMINOPHEN 5 MG-325 MG TAB: 5-325 mg | ORAL | Qty: 2

## 2012-11-28 MED FILL — CLEOCIN 600 MG/50 ML IN 5 % DEXTROSE INTRAVENOUS PIGGYBACK: 600 mg/50 mL | INTRAVENOUS | Qty: 50

## 2012-11-28 MED FILL — LACTATED RINGERS IV: INTRAVENOUS | Qty: 1000

## 2012-11-28 MED FILL — SODIUM CHLORIDE 0.9 % INJECTION: INTRAMUSCULAR | Qty: 10

## 2012-11-28 MED FILL — DILAUDID (PF) 2 MG/ML INJECTION SOLUTION: 2 mg/mL | INTRAMUSCULAR | Qty: 1

## 2012-11-28 MED FILL — VANCOMYCIN 1,000 MG IV SOLR: 1000 mg | INTRAVENOUS | Qty: 1000

## 2012-11-28 MED FILL — EXPAREL (PF) 1.3 % (13.3 MG/ML) SUSPENSION FOR LOCAL INFILTRATION: 1.3 % (13.3 mg/mL) | Qty: 20

## 2012-11-28 MED FILL — BUPIVACAINE-EPINEPHRINE (PF) 0.5 %-1:200,000 IJ SOLN: 0.5 %-1:200,000 | INTRAMUSCULAR | Qty: 20

## 2012-11-28 MED FILL — DIAZEPAM 5 MG/ML SYRINGE: 5 mg/mL | INTRAMUSCULAR | Qty: 2

## 2012-11-28 MED FILL — MIDAZOLAM 1 MG/ML IJ SOLN: 1 mg/mL | INTRAMUSCULAR | Qty: 5

## 2012-11-28 MED FILL — LIDOCAINE-EPINEPHRINE 1 %-1:100,000 IJ SOLN: 1 %-:00,000 | INTRAMUSCULAR | Qty: 20

## 2012-11-28 MED FILL — FENTANYL CITRATE (PF) 50 MCG/ML IJ SOLN: 50 mcg/mL | INTRAMUSCULAR | Qty: 5

## 2012-11-28 MED FILL — FENTANYL CITRATE (PF) 50 MCG/ML IJ SOLN: 50 mcg/mL | INTRAMUSCULAR | Qty: 2

## 2012-11-28 MED FILL — HEPARIN LOCK FLUSH (PORCINE) (PF) 100 UNIT/ML INTRAVENOUS SYRINGE: 100 unit/mL | INTRAVENOUS | Qty: 10

## 2012-11-28 MED FILL — TRANSDERM-SCOP 1 MG OVER 3 DAYS TRANSDERMAL PATCH: 1 mg over 3 days | TRANSDERMAL | Qty: 1

## 2012-11-28 MED FILL — MIDAZOLAM 1 MG/ML IJ SOLN: 1 mg/mL | INTRAMUSCULAR | Qty: 2

## 2012-11-28 NOTE — Brief Op Note (Signed)
BRIEF OPERATIVE NOTE    Date of Procedure: 11/28/2012   Preoperative Diagnosis: BREAST CANCER  Postoperative Diagnosis: LEFT BREAST CANCER    Procedure(s):  LEFT BREAST MODIFIED RADICAL MASTECTOMY W/ RECONSTRUCTION LEFT BREAST WITH TISSUE EXPANDER AND ALLODERM;PORTACATH INSERTION  BREAST RECONSTRUCTION /C  INSERTION EXPANDER & ALLODERM  Surgeon(s) and Role:  Panel 1:     * Binnie Rail, MD - Primary    Panel 2:     * Ivar Bury, MD - Primary  Anesthesia: General   Estimated Blood Loss: minimal  Specimens:   ID Type Source Tests Collected by Time Destination   1 : LEFT AXILLARY DISSECTION Fresh Lymph Node  Binnie Rail, MD 11/28/2012 [phone] Pathology   2 : LEFT BREAST TISSUE  (MODIFIED RADICAL;SKIN SPARING MASTECTOMY) Fresh Breast  Binnie Rail, MD 11/28/2012 [phone] Pathology   3 : LEFT MEDIAL MARGIN Fresh Breast  Binnie Rail, MD 11/28/2012 [phone] Pathology   4 : LEFT CAUDAL MARGIN Fresh Breast  Binnie Rail, MD 11/28/2012 [phone] Pathology      Findings: intact pec  Complications: none  Implants:   Implant Name Type Inv. Item Serial No. Manufacturer Lot No. LRB No. Used Action   PORT INTERMED 8FR POWERPORT --  - [id number]  [id number]  BARD PERIPHERAL VASCULAR [id number] Right 1 Implanted   EXPANDER     [id number] M[place] [id number] Left 1 Implanted     300/450 filled

## 2012-11-28 NOTE — Progress Notes (Signed)
Called MD on call (Dr Catha Brow) and made her him aware of blood pressure. Informed of medications, output, and input. Encouraged patient to take po fluids.

## 2012-11-28 NOTE — Progress Notes (Signed)
Chart reviewed for medical necessity and regarding possible DC needs.   CL Baxley, MSN

## 2012-11-28 NOTE — Progress Notes (Signed)
Bedside shift change report given to Katharine RN (oncoming nurse) by Shirl Weir RN (offgoing nurse).  Report given with SBAR, Kardex, Intake/Output, MAR and Recent Results.

## 2012-11-28 NOTE — Anesthesia Pre-Procedure Evaluation (Signed)
Anesthetic History   No history of anesthetic complications  PONV         Review of Systems / Medical History  Patient summary reviewed, nursing notes reviewed and pertinent labs reviewed    Pulmonary  Within defined limits               Neuro/Psych   Within defined limits           Cardiovascular  Within defined limits                   GI/Hepatic/Renal  Within defined limits                Endo/Other  Within defined limits      Arthritis     Other Findings            Physical Exam    Airway  Mallampati: II  TM Distance: 4 - 6 cm  Neck ROM: normal range of motion   Mouth opening: Normal     Cardiovascular  Regular rate and rhythm,  S1 and S2 normal,  no murmur, click, rub, or gallop             Dental    Dentition: Caps/crowns     Pulmonary  Breath sounds clear to auscultation               Abdominal  GI exam deferred       Other Findings            Anesthetic Plan    ASA: 1  Anesthesia type: general          Induction: Intravenous  Anesthetic plan and risks discussed with: Patient

## 2012-11-28 NOTE — Brief Op Note (Signed)
BRIEF OPERATIVE NOTE    Date of Procedure: 11/28/2012   Preoperative Diagnosis: BREAST CANCER  Postoperative Diagnosis: LEFT BREAST CANCER    Procedure(s):  LEFT BREAST MODIFIED RADICAL MASTECTOMY W/ RECONSTRUCTION LEFT BREAST WITH TISSUE EXPANDER AND ALLODERM;PORTACATH INSERTION  BREAST RECONSTRUCTION /C  INSERTION EXPANDER & ALLODERM  Surgeon(s) and Role:  Panel 1:     * Binnie Rail, MD - Primary    Panel 2:     * Ivar Bury, MD - Primary  Anesthesia: General   Estimated Blood Loss: minimal  Specimens:   ID Type Source Tests Collected by Time Destination   1 : LEFT AXILLARY DISSECTION Fresh Lymph Node  Binnie Rail, MD 11/28/2012 [phone] Pathology   2 : LEFT BREAST TISSUE  (MODIFIED RADICAL;SKIN SPARING MASTECTOMY) Fresh Breast  Binnie Rail, MD 11/28/2012 [phone] Pathology   3 : LEFT MEDIAL MARGIN Fresh Breast  Binnie Rail, MD 11/28/2012 [phone] Pathology   4 : LEFT CAUDAL MARGIN Fresh Breast  Binnie Rail, MD 11/28/2012 [phone] Pathology      Findings: axillary node dissection, #15 round JP drain left axilla.  8 FR power port right subclavian. Mastectomy without problems implant was subglandular   Complications: none  Implants:   Implant Name Type Inv. Item Serial No. Manufacturer Lot No. LRB No. Used Action   PORT INTERMED 8FR POWERPORT --  - [id number]   [id number]   BARD PERIPHERAL VASCULAR [id number] Right 1 Implanted

## 2012-11-28 NOTE — Other (Signed)
TRANSFER - OUT REPORT:    Verbal report given to [employer], RN on Emily [name]  being transferred to 356 for routine post - op       Report consisted of patient???s Situation, Background, Assessment and   Recommendations(SBAR).     Information from the following report(s) SBAR, OR Summary, Procedure Summary, Intake/Output and MAR was reviewed with the receiving nurse.    Opportunity for questions and clarification was provided.

## 2012-11-28 NOTE — H&P (Signed)
HISTORY OF PRESENT ILLNESS  Emily [name] is a 62 y.o. female.  HPI New patient referred by Dr. Cephus Richer for new diagnosis of LEFT breast cancer. Complains of pain to LEFT breast. Patient is able to palpate a lump. Denies any other breast changes, skin changes, rashes, or nipple drainage. Biopsy done on LEFT axilla and LEFT breast lump on 10/16/12.  Denies FH of breast and ovarian cancer.  Last mammogram was 09/24/12, BI-RADS 4a.  MRI done 13/14  MRI of brain done today.   Review of Systems   Constitutional: Positive for malaise/fatigue.   Eyes: Negative.   Respiratory: Positive for shortness of breath.   Cardiovascular: Negative.   Gastrointestinal: Negative.   Genitourinary: Negative.   Musculoskeletal: Negative.   Skin: Negative.   Neurological: Positive for headaches.   Endo/Heme/Allergies: Negative.   Psychiatric/Behavioral: Negative.   Physical Exam   Cardiovascular: Normal rate and normal heart sounds.   Pulmonary/Chest: Breath sounds normal. Right breast exhibits no inverted nipple, no mass, no nipple discharge, no skin change and no tenderness. Left breast exhibits no inverted nipple, no mass, no nipple discharge, no skin change and no tenderness. Breasts are symmetrical.       Lymphadenopathy:   Right cervical: No superficial cervical, no deep cervical and no posterior cervical adenopathy present.  Left cervical: No superficial cervical, no deep cervical and no posterior cervical adenopathy present.   Right axillary: No pectoral and no lateral adenopathy present.   Left axillary: No pectoral and no lateral adenopathy present.   ASSESSMENT and PLAN  Encounter Diagnoses    Name  Primary?    ???  Breast cancer  Yes    ???  Breast cancer, stage 2       Has decided to have unilateral mastectomy with reconstruction.  Will admit today for modified radical mastectomy with reconstruction.

## 2012-11-28 NOTE — Anesthesia Post-Procedure Evaluation (Signed)
Post-Anesthesia Evaluation and Assessment    Patient: Emily [name] MRN: [id number]  SSN: [id number]    Date of Birth: 1951/06/28  Age: 62 y.o.  Sex: female       Cardiovascular Function/Vital Signs  Visit Vitals   Item Reading   ??? BP 108/53   ??? Pulse 73   ??? Temp 36.5 ??C (97.7 ??F)   ??? Resp 13   ??? Ht 5\' 6"  (1.676 m)   ??? Wt 54.432 kg (120 lb)   ??? BMI 19.38 kg/m2   ??? SpO2 99%       Patient is status post General anesthesia for Procedure(s):  LEFT BREAST MODIFIED RADICAL MASTECTOMY W/ RECONSTRUCTION LEFT BREAST WITH TISSUE EXPANDER AND ALLODERM;PORTACATH INSERTION  BREAST RECONSTRUCTION /C  INSERTION EXPANDER & ALLODERM.    Nausea/Vomiting: None    Postoperative hydration reviewed and adequate.    Pain:  Pain Scale 1:  (Says she's sore. Med given by CRNA) (11/28/12 1048)  Pain Intensity 1: 0 (11/28/12 0744)   Managed    Neurological Status:   Neuro (WDL): Exceptions to WDL (11/28/12 1048)  Neuro  Neurologic State: Drowsy (11/28/12 1048)   At baseline    Mental Status and Level of Consciousness: Alert and oriented     Pulmonary Status:   O2 Device: Nasal cannula (11/28/12 1054)   Adequate oxygenation and airway patent    Complications related to anesthesia: None    Post-anesthesia assessment completed. No concerns    Signed By: Marya Fossa, MD     November 28, 2012

## 2012-11-29 MED ADMIN — oxyCODONE-acetaminophen (PERCOCET) 5-325 mg per tablet 2 Tab: ORAL | @ 11:00:00 | NDC 00054865024

## 2012-11-29 MED ADMIN — oxyCODONE-acetaminophen (PERCOCET) 5-325 mg per tablet 2 Tab: ORAL | @ 20:00:00 | NDC 00054865024

## 2012-11-29 MED ADMIN — oxyCODONE-acetaminophen (PERCOCET) 5-325 mg per tablet 2 Tab: ORAL | @ 16:00:00 | NDC 00054865024

## 2012-11-29 MED ADMIN — sodium chloride 0.9 % injection: @ 02:00:00 | NDC 63323018610

## 2012-11-29 MED ADMIN — diazepam (VALIUM) tablet 5 mg: ORAL | @ 17:00:00 | NDC 68084035911

## 2012-11-29 MED ADMIN — oxyCODONE-acetaminophen (PERCOCET) 5-325 mg per tablet 2 Tab: ORAL | @ 23:00:00 | NDC 00054865024

## 2012-11-29 MED ADMIN — clindamycin (CLEOCIN) 600mg D5W 50mL IVPB (premix): INTRAVENOUS | @ 11:00:00 | NDC 00781328991

## 2012-11-29 MED ADMIN — ondansetron (ZOFRAN) injection 4 mg: INTRAVENOUS | @ 03:00:00 | NDC 00781301072

## 2012-11-29 MED ADMIN — oxyCODONE-acetaminophen (PERCOCET) 5-325 mg per tablet 2 Tab: ORAL | @ 14:00:00 | NDC 00054865024

## 2012-11-29 MED ADMIN — oxyCODONE-acetaminophen (PERCOCET) 5-325 mg per tablet 2 Tab: ORAL | @ 03:00:00 | NDC 00054865024

## 2012-11-29 MED ADMIN — clindamycin (CLEOCIN) 600mg D5W 50mL IVPB (premix): INTRAVENOUS | @ 02:00:00 | NDC 00781328991

## 2012-11-29 MED ADMIN — sodium chloride (NS) flush 5-10 mL: INTRAVENOUS | @ 19:00:00 | NDC 87701099893

## 2012-11-29 MED ADMIN — sodium chloride 0.9 % injection: @ 03:00:00 | NDC 63323018610

## 2012-11-29 MED ADMIN — diazepam (VALIUM) injection 5 mg: INTRAVENOUS | @ 02:00:00 | NDC 00409127332

## 2012-11-29 MED ADMIN — sodium chloride (NS) flush 5-10 mL: INTRAVENOUS | @ 03:00:00 | NDC 87701099893

## 2012-11-29 MED ADMIN — sodium chloride 0.9 % injection: @ 10:00:00 | NDC 63323018610

## 2012-11-29 MED ADMIN — HYDROmorphone (PF) (DILAUDID) injection 1 mg: INTRAVENOUS | @ 18:00:00 | NDC 00409255201

## 2012-11-29 MED ADMIN — oxyCODONE-acetaminophen (PERCOCET) 5-325 mg per tablet 2 Tab: ORAL | @ 07:00:00 | NDC 00054865024

## 2012-11-29 MED ADMIN — lactated ringers infusion: INTRAVENOUS | @ 10:00:00 | NDC 00409795309

## 2012-11-29 MED ADMIN — ondansetron (ZOFRAN) injection 4 mg: INTRAVENOUS | @ 10:00:00 | NDC 00781301072

## 2012-11-29 MED FILL — SODIUM CHLORIDE 0.9 % INJECTION: INTRAMUSCULAR | Qty: 10

## 2012-11-29 MED FILL — ONDANSETRON (PF) 4 MG/2 ML INJECTION: 4 mg/2 mL | INTRAMUSCULAR | Qty: 2

## 2012-11-29 MED FILL — HYDROMORPHONE (PF) 1 MG/ML IJ SOLN: 1 mg/mL | INTRAMUSCULAR | Qty: 1

## 2012-11-29 MED FILL — CLEOCIN 600 MG/50 ML IN 5 % DEXTROSE INTRAVENOUS PIGGYBACK: 600 mg/50 mL | INTRAVENOUS | Qty: 50

## 2012-11-29 MED FILL — DIAZEPAM 5 MG TAB: 5 mg | ORAL | Qty: 1

## 2012-11-29 MED FILL — OXYCODONE-ACETAMINOPHEN 5 MG-325 MG TAB: 5-325 mg | ORAL | Qty: 2

## 2012-11-29 MED FILL — DIAZEPAM 5 MG/ML SYRINGE: 5 mg/mL | INTRAMUSCULAR | Qty: 2

## 2012-11-29 MED FILL — OXYCODONE-ACETAMINOPHEN 5 MG-325 MG TAB: 5-325 mg | ORAL | Qty: 1

## 2012-11-29 NOTE — Op Note (Signed)
Name:      Emily [name], Emily [name]                                          Surgeon:        Ivar Bury, MD  Account #: [id number]                 Surgery Date:   11/28/2012  DOB:       20-Nov-1950  Age:       62                           Location:                                 OPERATIVE REPORT      PREOPERATIVE DIAGNOSIS: Left breast cancer.    POSTOPERATIVE DIAGNOSIS: Left breast cancer.    PROCEDURES PERFORMED  1. Dr. Stanford Breed: A left skin-sparing mastectomy, left sentinel lymph  node biopsy and placement of right Port-A-Cath, this is dictated in a  separate note.  2. Dr. Bertram Millard. Onica Davidovich: Immediate breast reconstruction using tissue  expander and AlloDerm.    ANESTHESIA: General.    ESTIMATED BLOOD LOSS: My portion of the procedure is minimal.    DRAINS AND TUBES: Blake drain x2 for the left breast.    SPECIMENS REMOVED:  none    INDICATIONS: The patient is a 62 year old white female who has left-sided  breast cancer. She has got a history of breast implants. Prior to  procedure, risks and benefits of surgery were discussed with the patient.  Informed consent was obtained.    DESCRIPTION OF PROCEDURE: On the day of surgery, the patient was seen in  the preoperative holding area. She was marked. She was taken to the  operating room by Dr. Maxwell Marion, where he performed the aforementioned  procedures. Upon completion, the case was turned over to me. We prepped and  draped the chest. We started off on the left side, elevated the pectoralis  muscle from lateral to medial level of the inframammary fold, releasing its  attachments to the chest wall, and all the way to the lateral sternal  border. I then carried this dissection from caudal to cranial, from the  level of the inframammary fold up to the clavicle. Once I had a nice flap  elevated, I obtained hemostasis with electrocautery. I then fastened  AlloDerm along the inframammary fold, going from medial to lateral from the  lateral border of the sternum to the  anterior axillary line. I then  irrigated this space out with bacitracin solution. This was followed by  placement of a tissue expander, 450 mL tissue expander. I then secured the  caudal edge of the pectoralis to the cephalad edge of the AlloDerm from  medial to lateral. I then placed 2 drains, 1 subpectorally, 1  subcutaneously, and these were brought out through stab wounds at the  axillary line inframammary fold junction. These were secured using 2-0  nylon. The incision was then closed in layers using interrupted running 2-0  Vicryl for the deep dermis, and running Prolene for the skin. The tissue  expander was insufflated to obliterate the dead space. A total of 300 mL  was used. At the end of the case,  the incision was reinforced with  Dermabond, and the patient was placed in a postoperative bra and brought  from the operating room to the PACU in stable condition.          Ivar Bury, MD    cc:   Ivar Bury, MD        SMC/wmx; D: 11/29/2012 09:08 A; T: 11/29/2012 09:47 A; Doc# [id number]; Job#  [id number]

## 2012-11-29 NOTE — Op Note (Signed)
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    PROCEDURES PERFORMED:  1. Left total skin-sparing mastectomy with left axillary node dissection  and immediate reconstruction by Dr. Marya Fossa.  2. Port-A-Cath insertion.    ESTIMATED BLOOD LOSS: Minimal.    SPECIMENS REMOVED: Left axillary nodes in the left breast.    SURGEONS:  1. Timoteo Expose. Valora Corporal., MD.  2. Ivar Bury, MD.    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    DESCRIPTION OF PROCEDURE: After satisfactory induction of general  endotracheal anesthesia, the patient was prepped and draped in sterile  fashion. The right subclavian vein was cannulated on the first pass. Wire  passed in the superior vena cava and position confirmed on fluoroscopy. An  anterior chest wall pocket was made and an 8-French PowerPort was tunneled  from the chest wall pocket to the original stick site and cut to length.  Using a combination dilator and breakaway sheath, the catheter was placed  in the superior vena cava and position confirmed with fluoroscopy. Catheter  aspirated easily, was flushed with heparinized saline solution and final  Hep-Lock flush. The stick site was closed with a single U stitch of 4-0  Monocryl. The catheter was secured with 3-0 Vicryl. The skin was closed  with interrupted 3-0 Vicryl and running subcuticular 4-0 Monocryl on  the  skin.    Attention was then turned to the left side where an axillary incision was  made, deepened through subcutaneous tissue with Bovie cautery. The axilla  was entered and utilizing the Enseal device an axillary dissection was  performed by sweeping the axillary contents down off the bottom of the  axillary vein and laterally from the pectoralis minor muscle. Level 1 and  level 2 lymph nodes were completely removed, maintaining hemostasis with  the Enseal device and being careful not to devascularize any major  neurovascular bundles. The long thoracic and thoracodorsal nerves were  identified and preserved. The median pectoral nerve was not visualized.  There was no palpable adenopathy left at the conclusion of the procedure.  The axillary node package was sent to Pathology. The wound was anesthetized  with 0.5% Marcaine. It was closed over a #15 round  JP drain with an  interrupted 3-0 Vicryl and a running subcuticular 4-0 Monocryl on the  skin.    Attention was turned to the breast where an elliptical incision was made  around the nipple-areolar complex. It was deepened through subcutaneous  tissue with Bovie cautery. Skin flaps were raised to the clavicle, medial  to the sternum, inferior to the inframammary fold, lateral to the  latissimus dorsi muscle. The breast was removed off the chest wall but was unclear whether  the current implant that she had in place was submuscular or subglandular.  She thought it was submuscular; however, there were no muscle fibers seen  over the implant. Therefore the implant was removed and below the implant  capsule you could clearly see the pectoralis major muscles confirming that  the implant was actually subglandular. Therefore the implant capsule was  removed off the chest wall with the remaining breast tissue subfascially.  All dissection planes were noted to be hemostatic. The breast tissue was  oriented and sent to Pathology, and again all dissection planes  were  hemostatic. At this point in time Dr. Imogene Burn entered the room to complete the  immediate reconstruction.          Binnie Rail, MD    cc:   Ivar Bury, MD        Aurther Loft, MD        Binnie Rail, MD        Tamera Stands, MD        Lenard Galloway, MD        JVP/wmx; D: 11/29/2012 09:23 A; T: 11/29/2012 09:58 A; Doc# [id number]; Job#  [id number]

## 2012-11-29 NOTE — Op Note (Signed)
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    PROCEDURES PERFORMED:  1. Left total skin-sparing mastectomy with left axillary node dissection  and immediate reconstruction by Dr. Marya Fossa.  2. Port-A-Cath insertion.    ESTIMATED BLOOD LOSS: Minimal.    SPECIMENS REMOVED: Left axillary nodes in the left breast.    SURGEONS:  1. Timoteo Expose. Valora Corporal., MD.  2. Ivar Bury, MD.    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    DESCRIPTION OF PROCEDURE: After satisfactory induction of general  endotracheal anesthesia, the patient was prepped and draped in sterile  fashion. The right subclavian vein was cannulated on the first pass. Wire  passed in the superior vena cava and position confirmed on fluoroscopy. An  anterior chest wall pocket was made and an 8-French PowerPort was tunneled  from the chest wall pocket to the original stick site and cut to length.  Using a combination dilator and breakaway sheath, the catheter was placed  in the superior vena cava and position confirmed with fluoroscopy. Catheter  aspirated easily, was flushed with heparinized saline solution and final  Hep-Lock flush. The stick site was closed with a single U stitch of 4-0  Monocryl. The catheter was secured with 3-0 Vicryl. The skin was closed  with interrupted 3-0 Vicryl and running subcuticular 4-0 Monocryl on the  skin.     Attention was then turned to the left side where an axillary incision was  made, deepened through subcutaneous tissue with Bovie cautery. The axilla  was entered and utilizing the Enseal device an axillary dissection was  performed by sweeping the axillary contents down off the bottom of the  axillary vein and laterally from the pectoralis minor muscle. Level 1 and  level 2 lymph nodes were completely removed, maintaining hemostasis with  the Enseal device and being careful not to devascularize any major  neurovascular bundles. The long thoracic and thoracodorsal nerves were  identified and preserved. The median pectoral nerve was not visualized.  There was no palpable adenopathy left at the conclusion of the procedure.  The axillary node package was sent to Pathology. The wound was anesthetized  with 0.5% Marcaine. It was closed over a #15 round  JP drain with an  interrupted 3-0 Vicryl and a running subcuticular 4-0 Monocryl on the  skin.    Attention was turned to the breast where an elliptical incision was made  around the nipple-areolar complex. It was deepened through subcutaneous  tissue with Bovie cautery. Skin flaps were raised to the clavicle, medial  to the sternum, inferior to the inframammary fold, lateral to the  latissimus dorsi muscle. The breast was removed off the chest wall but was unclear whether  the current implant that she had in place was submuscular or subglandular.  She thought it was submuscular; however, there were no muscle fibers seen  over the implant. Therefore the implant was removed and below the implant  capsule you could clearly see the pectoralis major muscles confirming that  the implant was actually subglandular. Therefore the implant capsule was  removed off the chest wall with the remaining breast tissue subfascially.  All dissection planes were noted to be hemostatic. The breast tissue was  oriented and sent to Pathology, and again all dissection planes were  hemostatic. At  this point in time Dr. Imogene Burn entered the room to complete the  immediate reconstruction.          Binnie Rail, MD    cc:   Ivar Bury, MD        Aurther Loft, MD        Binnie Rail, MD        Tamera Stands, MD        Lenard Galloway, MD        JVP/wmx; D: 11/29/2012 09:23 A; T: 11/29/2012 09:58 A; Doc# [id number]; Job#  [id number]

## 2012-11-29 NOTE — Op Note (Signed)
Name:      Emily [name], Emily [name]                                          Surgeon:        Docie Abramovich M Teddi Badalamenti, MD  Account #: [id number]                 Surgery Date:   11/28/2012  DOB:       06/08/1951  Age:       61                           Location:                                 OPERATIVE REPORT      PREOPERATIVE DIAGNOSIS: Left breast cancer.    POSTOPERATIVE DIAGNOSIS: Left breast cancer.    PROCEDURES PERFORMED  1. Dr. James Pellicane: A left skin-sparing mastectomy, left sentinel lymph  node biopsy and placement of right Port-A-Cath, this is dictated in a  separate note.  2. Dr. Niasia Lanphear M. Rebel Laughridge: Immediate breast reconstruction using tissue  expander and AlloDerm.    ANESTHESIA: General.    ESTIMATED BLOOD LOSS: My portion of the procedure is minimal.    DRAINS AND TUBES: Blake drain x2 for the left breast.    SPECIMENS REMOVED:  none    INDICATIONS: The patient is a 61-year-old white female who has left-sided  breast cancer. She has got a history of breast implants. Prior to  procedure, risks and benefits of surgery were discussed with the patient.  Informed consent was obtained.    DESCRIPTION OF PROCEDURE: On the day of surgery, the patient was seen in  the preoperative holding area. She was marked. She was taken to the  operating room by Dr. Pellicane, where he performed the aforementioned  procedures. Upon completion, the case was turned over to me. We prepped and  draped the chest. We started off on the left side, elevated the pectoralis  muscle from lateral to medial level of the inframammary fold, releasing its  attachments to the chest wall, and all the way to the lateral sternal  border. I then carried this dissection from caudal to cranial, from the  level of the inframammary fold up to the clavicle. Once I had a nice flap  elevated, I obtained hemostasis with electrocautery. I then fastened  AlloDerm along the inframammary fold, going from medial to lateral from the  lateral border of the sternum to the  anterior axillary line. I then  irrigated this space out with bacitracin solution. This was followed by  placement of a tissue expander, 450 mL tissue expander. I then secured the  caudal edge of the pectoralis to the cephalad edge of the AlloDerm from  medial to lateral. I then placed 2 drains, 1 subpectorally, 1  subcutaneously, and these were brought out through stab wounds at the  axillary line inframammary fold junction. These were secured using 2-0  nylon. The incision was then closed in layers using interrupted running 2-0  Vicryl for the deep dermis, and running Prolene for the skin. The tissue  expander was insufflated to obliterate the dead space. A total of 300 mL  was used. At the end of the case,   the incision was reinforced with  Dermabond, and the patient was placed in a postoperative bra and brought  from the operating room to the PACU in stable condition.          Yaviel Kloster M Maanasa Aderhold, MD    cc:   Ferrah Panagopoulos M Christopher Hink, MD        SMC/wmx; D: 11/29/2012 09:08 A; T: 11/29/2012 09:47 A; Doc# [id number]; Job#  [id number]

## 2012-11-29 NOTE — Progress Notes (Signed)
Was having some pain last night but controlled now.  Just feeling a little weak.    AVSS    JPs serosanguineous    Incision looks good, flaps very healthy, well perfused, no seroma or hematoma  Axillary incision looks good.    Doing well  Mobilize  DC planning per Dr. Imogene Burn  I will call with pathology.

## 2012-11-29 NOTE — Progress Notes (Signed)
Pain issues overnight  Still feeling light headed  Breast looks great  Drains appropriate  Advance diet and mobilize

## 2012-11-30 MED ADMIN — diazepam (VALIUM) tablet 5 mg: ORAL | @ 01:00:00 | NDC 68084035911

## 2012-11-30 MED ADMIN — oxyCODONE-acetaminophen (PERCOCET) 5-325 mg per tablet 2 Tab: ORAL | @ 07:00:00 | NDC 00054865024

## 2012-11-30 MED ADMIN — sodium chloride (NS) flush 5-10 mL: INTRAVENOUS | @ 16:00:00 | NDC 87701099893

## 2012-11-30 MED ADMIN — oxyCODONE-acetaminophen (PERCOCET) 5-325 mg per tablet 2 Tab: ORAL | @ 12:00:00 | NDC 00054865024

## 2012-11-30 MED ADMIN — docusate sodium (COLACE) capsule 100 mg: ORAL | @ 20:00:00 | NDC 62584068311

## 2012-11-30 MED ADMIN — oxyCODONE-acetaminophen (PERCOCET) 5-325 mg per tablet 2 Tab: ORAL | @ 03:00:00 | NDC 00054865024

## 2012-11-30 MED ADMIN — HYDROmorphone (DILAUDID) tablet 4 mg: ORAL | @ 19:00:00 | NDC 42858030125

## 2012-11-30 MED ADMIN — HYDROmorphone (DILAUDID) tablet 4 mg: ORAL | @ 22:00:00 | NDC 42858030125

## 2012-11-30 MED ADMIN — diazepam (VALIUM) tablet 5 mg: ORAL | @ 08:00:00 | NDC 68084035911

## 2012-11-30 MED ADMIN — sodium chloride (NS) flush 5-10 mL: INTRAVENOUS | @ 11:00:00 | NDC 82903065462

## 2012-11-30 MED ADMIN — HYDROmorphone (PF) (DILAUDID) injection 1 mg: INTRAVENOUS | @ 10:00:00 | NDC 00409255201

## 2012-11-30 MED ADMIN — HYDROmorphone (PF) (DILAUDID) injection 1 mg: INTRAVENOUS | @ 16:00:00 | NDC 00409255201

## 2012-11-30 MED ADMIN — sodium chloride (NS) flush 5-10 mL: INTRAVENOUS | @ 03:00:00 | NDC 82903065462

## 2012-11-30 MED ADMIN — diazepam (VALIUM) tablet 5 mg: ORAL | @ 20:00:00 | NDC 68084035911

## 2012-11-30 MED FILL — OXYCODONE-ACETAMINOPHEN 5 MG-325 MG TAB: 5-325 mg | ORAL | Qty: 2

## 2012-11-30 MED FILL — BD POSIFLUSH NORMAL SALINE 0.9 % INJECTION SYRINGE: INTRAMUSCULAR | Qty: 10

## 2012-11-30 MED FILL — DOCUSATE SODIUM 100 MG CAP: 100 mg | ORAL | Qty: 1

## 2012-11-30 MED FILL — SODIUM CHLORIDE 0.9 % INJECTION: INTRAMUSCULAR | Qty: 10

## 2012-11-30 MED FILL — DIAZEPAM 5 MG TAB: 5 mg | ORAL | Qty: 1

## 2012-11-30 MED FILL — HYDROMORPHONE 2 MG TAB: 2 mg | ORAL | Qty: 2

## 2012-11-30 MED FILL — HYDROMORPHONE (PF) 1 MG/ML IJ SOLN: 1 mg/mL | INTRAMUSCULAR | Qty: 1

## 2012-11-30 NOTE — Other (Signed)
Bedside shift change report given to Maralyn Sago, RN (oncoming nurse) by Candise Bowens, RN (offgoing nurse).  Report given with SBAR, Procedure Summary, Intake/Output and MAR.     Aldona Lento, RN

## 2012-11-30 NOTE — Progress Notes (Signed)
I have reviewed discharge instructions with the patient and spouse.  The patient and spouse verbalized understanding. I provided teach back instructions to the patient and spouse regarding the care of her jackson pratts.

## 2012-11-30 NOTE — Progress Notes (Signed)
Pt c/o light headedness and pain  bp's stable  Hr stable  Skin flaps look great  If pain control better with oral dilaudid, dc home

## 2012-12-04 NOTE — Telephone Encounter (Signed)
Called with path

## 2012-12-05 NOTE — Patient Instructions (Addendum)
Prescriptions for emla cream, compazine, decadron, zofran    Plan on starting 3/26

## 2012-12-05 NOTE — Progress Notes (Signed)
[facility]  [address], [address]   [place], [place]   [address]  W: [phone]   F: [phone]      f/u HEME/ONC CONSULT      Reason for visit: evaluation for treatment for   Breast Cancer      HPI:   Emily [name] is a 62 y.o.  female who I was asked to see in consultation at the request of Dr. Maxwell Marion for evaluation for systemic therapy for breast cancer.      She had a screening ultrasound on 09/24/12 showing a 2.6 cm left LN.  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  HER 2 negative IHC at 0.    She has a history of a pheochromocytoma surgically removed in 1997.    She then had a MRI breast on 10/05/12 showing the 2.8 x 2 cm LN as well as a left breast LIQ 0.9 cm x 1.1 cm x 0.6 cm mass.  Biopsy of that mass by FNA on 10/16/12 shows a poorly differentiated carcinoma.  These slides were air-dried, HER 2 not able to be performed on them.    Mammaprint of LN shows high risk luminal, ER + at 0.19, PR + at 0.61, HER 2 negative at -0.69.    She underwent L lumpectomy on 11/28/12 showing IDC with neuroendocrine features, gr 2, o.5 cm, DCIS gr 2-3, + LVI, 1/14 LN + with a 1 cm met, no ECE.  Receptors pending.    Complains of right shoulder pain since surgery and anxiety.  Gr 2 loss of appetite, gr 2 fatigue, gr 1 anxiety.    DX   Encounter Diagnosis   Name Primary?   ??? Breast cancer, stage 2 Yes              Past Medical History   Diagnosis Date   ??? Cancer      left breast  cancer   ??? Anxiety    ??? Arthritis    ??? Nausea & vomiting    ??? Concussion 2011   ??? Chronic tension headaches    ??? Headache      OCCAS   ??? Other ill-defined conditions      PHEOCHROMOCYTOMA     Past Surgical History   Procedure Laterality Date   ??? Pr  breast surgery procedure unlisted  '91     breast implants   ??? Hx heent       T&A   ??? Hx heent       right adrenal removed   ??? Pr abdomen surgery proc unlisted       hernia mesh repair   ??? Hx appendectomy     ??? Hx gyn       c-sections   ??? Hx oophorectomy       right   ??? Hx orthopaedic       bilateral shoulder arthroscopy   ??? Hx modified radical mastectomy  11/28/2012     LEFT BREAST MODIFIED RADICAL MASTECTOMY W/ RECONSTRUCTION LEFT BREAST WITH TISSUE EXPANDER AND ALLODERM;PORTACATH INSERTION performed by Binnie Rail, MD at [facility] AMBULATORY OR   ??? Hx breast reconstruction  11/28/2012     BREAST RECONSTRUCTION /C  INSERTION EXPANDER & ALLODERM performed by Bertram Millard  Imogene Burn, MD at [facility] AMBUL[facility]ORY OR     History     Social History   ??? Marital Status: SINGLE     Spouse Name: N/A     Number of Children: N/A   ??? Years of Education: N/A     Social History Main Topics   ??? Smoking status: Never Smoker    ??? Smokeless tobacco: Never Used   ??? Alcohol Use: No   ??? Drug Use: No   ??? Sexually Active: Not on file     Other Topics Concern   ??? Not on file     Social History Narrative   ??? No narrative on file     Family History   Problem Relation Age of Onset   ??? Alzheimer Mother    ??? Diabetes Mother    ??? Other Mother      COMBATIVE POST ANESTHESIA   ??? Heart Disease Father    ??? Diabetes Father    ??? Lung Disease Father      COPD   ??? Headache Sister    ??? Migraines Sister        Current Outpatient Prescriptions   Medication Sig Dispense Refill   ??? diazepam (VALIUM) 5 mg tablet Take 2.5 mg by mouth every six (6) hours as needed for Anxiety. Indications: MUSCLE SPASM       ??? DOCUSATE SODIUM (COLACE PO) Take  by mouth.       ??? polyethylene glycol (MIRALAX) 17 gram/dose powder Take 17 g by mouth daily.       ??? dexamethasone (DECADRON) 4 mg tablet Take 2 Tabs by mouth daily. 8 mg twice a day the day before and day after chemo  32 Tab  3   ??? lidocaine-prilocaine (EMLA) topical cream Apply  to affected area as needed for Pain.  30 g  0   ???  ondansetron hcl (ZOFRAN) 8 mg tablet Take 1 Tab by mouth every eight (8) hours as needed for Nausea.  60 Tab  3   ??? prochlorperazine (COMPAZINE) 10 mg tablet Take 1 Tab by mouth every six (6) hours as needed for Nausea.  50 Tab  5   ??? pegfilgrastim (NEULASTA) 6 mg/0.32mL injection 0.6 mL by SubCUTAneous route once for 1 dose. 24-48 hours following chemotherapy  2 Syringe  1   ??? HYDROmorphone (DILAUDID) 2 mg tablet Take 2 Tabs by mouth every three (3) hours as needed (every 3-4 hours).  70 Tab  0   ??? LORazepam (ATIVAN) 1 mg tablet Take 0.5 mg by mouth every four (4) hours as needed for Anxiety.       ??? HYDROcodone-acetaminophen (NORCO) 5-325 mg per tablet Take 0.5 Tabs by mouth every four (4) hours as needed for Pain.           Allergies   Allergen Reactions   ??? Aspirin Unknown (comments)     Gi bleeding   ??? Keflex (Cephalexin) Rash   ??? Nsaids (Non-Steroidal Anti-Inflammatory Drug) Swelling     Throat swells   ??? Prednisone Rash       Review of Systems    A comprehensive review of systems was performed and all systems were negative except for HPI.        Objective:  Physical Exam:  BP 121/62   Pulse 67   Temp(Src) 96.1 ??F (35.6 ??C) (Oral)   Resp 13   Ht 5\' 6"  (1.676 m)   Wt 126 lb 3.2 oz (57.244 kg)  BMI 20.38 kg/m2   SpO2 100%    General: No distress  Respiratory: Normal respiratory effort  CV: No peripheral edema  Skin: No rashes, ecchymoses, or petechiae  Psych: Alert, oriented, normal mood/affect    Diagnostic Imaging   Results for orders placed during the hospital encounter of 11/28/12   XR FLUOROSCOPY UNDER 60 MINUTES    Narrative **Final Report**       ICD Codes / Adm.Diagnosis: 174.9   / Malignant neoplasm of breast (    Examination:  CR FLUORO GUIDE UP TO 1 HR  - [id number] - Nov 28 2012  9:15AM  Accession No:  [id number]  Reason:  port a cath      REPORT:  Portable fluoroscopy was utilized in the Operating Room.        IMPRESSION:  PORTABLE FLUOROSCOPY.      Fluoro time:  0.066 minutes      vk             Signing/Reading Doctor: BSR ADMINISTRATOR [phone])    Approved: BSR ADMINISTRATOR [phone])  Nov 30 2012  4:18PM                                  Results for orders placed in visit on 10/22/12   PET BREAST CANCER DIAGNOSIS   10/12/12 PET:  Negative for distant mets  10/22/12 brain MRI:  negative    Lab Results  Lab Results   Component Value Date/Time    WBC 7.7 11/21/2012  2:32 PM    HGB 12.8 11/21/2012  2:32 PM    HCT 38.3 11/21/2012  2:32 PM    PLATELET 257 11/21/2012  2:32 PM    MCV 87.8 11/21/2012  2:32 PM       Lab Results   Component Value Date/Time    Sodium 139 11/21/2012  2:32 PM    Potassium 4.3 11/21/2012  2:32 PM    Chloride 103 11/21/2012  2:32 PM    CO2 31 11/21/2012  2:32 PM    Anion gap 5 11/21/2012  2:32 PM    Glucose 78 11/21/2012  2:32 PM    BUN 12 11/21/2012  2:32 PM    Creatinine 0.36 11/21/2012  2:32 PM    BUN/Creatinine ratio 33 11/21/2012  2:32 PM    GFR est AA >60 11/21/2012  2:32 PM    GFR est non-AA >60 11/21/2012  2:32 PM    Calcium 9.0 11/21/2012  2:32 PM    AST 23 11/21/2012  2:32 PM    Alk. phosphatase 93 11/21/2012  2:32 PM    Protein, total 7.0 11/21/2012  2:32 PM    Albumin 4.2 11/21/2012  2:32 PM    Globulin 2.8 11/21/2012  2:32 PM    A-G Ratio 1.5 11/21/2012  2:32 PM    ALT 41 11/21/2012  2:32 PM       .    Assessment/Plan:  61 y.o. female with a 0.5 cm left breast cancer, IDC with endocrine features, 1/14 LN +, gr 2, high risk mammaprint,  ER and PR +, HER 2 negative    PS 0    1. Breast cancer with neuroendocrine features, stage IIA, T1bN1a    We explained to the patient that the goal of systemic adjuvant therapy is to improve the chances for cure and decrease the risk of relapse. We explained why a patient can have microscopic cancer spread now  even though physical examination, laboratory studies and imaging studies are negative for cancer. We explained that the same treatments used now as adjuvant or preventive treatments rarely if ever are curative in women who develop metastases.     The benefits of  systemic therapy at 10 years were estimated for this patient from www.adjuvantonline.com using the clinical data available and the following results were presented to the patient. Endocrine therapies such as ovarian ablation, tamoxifen and/or aromatase inhibitors are only appropriate in hormone receptor positive patents. 2nd generation chemotherapy includes docetaxel and cyclophosphamide every 3 weeks for four cycles. 3rd generation chemotherapy includes ???dose-dense??? chemotherapy with doxorubicin, cyclophosphamide and paclitaxel or ???TAC??? (docetaxel, doxorubicin, and cyclophosphamide).     With no further therapy, her RR is about 36%; endocrine therapy will decrease her RR to 19%; and 2nd gen chemo will decrease her RR to 12%; 3rd gen to 10%.  These #s may be higher with her high risk mammaprint.     I discussed the potential risks of dose-dense chemotherapy with the patient.  Major toxicities include nausea and vomiting, stomatitis, fatigue, and a small risk of heart damage. Anemia frequently results and occasionally requires growth factors and rarely transfusions. Neutropenic fever is uncommon, but can be a life-threatening problem. Also, there is a small but increased risk of myelodysplasia and acute leukemia. We provided the patient with detailed information concerning toxicity including frequent toxicities that only last a few days, such as nausea, vomiting, mouth sores, arthralgia, myalgia, and potentially allergic reactions to paclitaxel, as well as toxicities which can be longer lasting including total alopecia, fatigue, anemia and neuropathy. We provided the patient with detailed information concerning the toxicities of their regimen in addition to our verbal discussion.    We discussed the toxicities of TC chemotherapy in detail. This chemotherapy frequently causes a low white blood cell count and a small percent of patients require hospital admission for treatment of neutropenic fever. We explained that on  occasion we consider the use of growth factors to minimize this risk. We explained to the patient that some side effects, if they occur, only last a few days including nausea, vomiting, stomatitis, arthralgia, myalgia, and allergic reactions to Taxotere. We told the patient that severe nausea and vomiting were very uncommon and that some side effects, if they occur, will last longer: this includes hair loss, which will be seen in all patients treated with these agents and fatigue, which will be seen in most. The patient was also told that if she is having menstrual function that she could develop chemotherapy-induced amenorrhea and infertility. We also told the patient that there was a very small risk of acute leukemia. We also informed the patient that heart damage is rare with these agents    After this discussion, she is agreeable to TC x 4.  This is reasonable.  Port has been placed.    The patient was given presciptions for a wig, emla cream, decadron to take 8 mg bid the day before and day after chemo, zofran and compazine.  Neulasta in opic the following day.      In the future, based on her pheo and now breast cancer history, I recommend genetic counseling.    Will plan on starting on 3/25    2. Right arm pain:  Referral to PT as it started from surgery; lymphedema clinic for prevention    Thank you for this consult.  All of the patient's questions were answered today.    >40  min were spent with this patient with > 50% of that time spent in face to face counseling.    Patient Instructions   Prescriptions for emla cream, compazine, decadron, zofran    Plan on starting 3/26       Follow-up Disposition:  Return in 3 weeks (on 12/25/2012).    Dortha Kern, MD

## 2012-12-05 NOTE — Progress Notes (Signed)
Emily [name] is a 62 y.o. female f/u for breast cancer.

## 2012-12-06 ENCOUNTER — Encounter

## 2012-12-06 NOTE — Telephone Encounter (Signed)
Orders faxed to patient.

## 2012-12-06 NOTE — Telephone Encounter (Signed)
Pt would like PT orders faxed to [phone]. Also informed she would not be picking up compazine because it does not help her with nausea.

## 2012-12-10 NOTE — Telephone Encounter (Signed)
Left voicemail for pt to call office back.

## 2012-12-10 NOTE — Discharge Summary (Signed)
Physician Discharge Summary     Patient ID:  Emily [name]  [id number]  61 y.o.  [birth year]    Admit Date: 11/28/2012    Discharge Date: 11/30/2012    Admission Diagnoses: BREAST CANCER  BREAST CANCER    Discharge Diagnoses:    Problem List as of 11/30/2012 Date Reviewed: 2012/11/10        Codes Class Noted - Resolved    Breast cancer, stage 2 174.9  10/22/2012 - Present               Admission Condition: Good    Discharge Condition: Good    Last Procedure: Procedure(s):  LEFT BREAST MODIFIED RADICAL MASTECTOMY W/ RECONSTRUCTION LEFT BREAST WITH TISSUE EXPANDER AND ALLODERM;PORTACATH INSERTION  BREAST RECONSTRUCTION /C  INSERTION EXPANDER & ALLODERM    Hospital Course:   Normal hospital course for this procedure.    Consults: None    Significant Diagnostic Studies:     Disposition: home    Patient Instructions:   Cannot display discharge medications since this patient is not currently admitted.    Activity: no lifting, Driving, or Strenuous exercise for two weeks  Diet: Regular Diet  Wound Care: Keep wound clean and dry    Follow-up with Dr. Imogene Burn in  1 week.  Follow-up tests/labs  none    Signed:  Ivar Bury, MD  12/10/2012  8:46 AM

## 2012-12-10 NOTE — Telephone Encounter (Signed)
Pt called regarding the prescription called in.  She stated that she cannot take the compazine. Please call [phone]

## 2012-12-10 NOTE — Telephone Encounter (Signed)
Spoke with pt and she states that she is unable to take compazine, as this has made her more nauseous after taking in the past. Pt would like to know if she should have more zofran prescribed or if she could try the Zuplens, as suggested by her daughter in law. Informed pt to wait and see how nauseous she is after chemotherapy, to see if she will need any additional zofran. Pt voiced understanding.

## 2012-12-10 NOTE — Telephone Encounter (Signed)
Pt returning call from Ashley.

## 2012-12-17 NOTE — Telephone Encounter (Signed)
Let voicemail for pt to call office back.

## 2012-12-17 NOTE — Telephone Encounter (Signed)
Pt called and would like to know a tentative timeline for start date for radiation. Please call back at [phone].

## 2012-12-17 NOTE — Telephone Encounter (Signed)
Spoke with pt and informed that schedule has not been made yet, but should start 1 month after chemotherapy finishes. Pt voiced understanding.

## 2012-12-17 NOTE — Telephone Encounter (Signed)
Please advise when you think she would start radiation treatment after chemo. First chemo is on 3/26.

## 2012-12-21 NOTE — Communication Body (Signed)
HISTORY OF PRESENT ILLNESS  Emily [name] is a 62 y.o. female.  HPI ESTABLISHED patient here for post op visit s/p LEFT breast skin sparing mastectomy with LEFT ALND and tissue expander reconstruction. Patient has been having a lot of pain the past several days ever since the last saline injection on Wednesday, so this morning Dr. Imogene Burn took off 50cc and patient is feeling relief from that. She is using dilaudid for pain control. She starts chemo on Tuesday. She will be receiving TC x 4.  She had a screening ultrasound on 09/24/12 showing a 2.6 cm left LN. 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 HER 2 negative IHC at 0.   She then had a MRI breast on 10/05/12 showing the 2.8 x 2 cm LN as well as a left breast LIQ 0.9 cm x 1.1 cm x 0.6 cm mass. Biopsy of that mass by FNA on 10/16/12 shows a poorly differentiated carcinoma. These slides were air-dried, HER 2 not able to be performed on them.   Mammaprint of LN shows high risk luminal, ER + at 0.19, PR + at 0.61, HER 2 negative at -0.69.   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 Receptors pending.     ROS    Physical Exam   Pulmonary/Chest:           ASSESSMENT and PLAN  Encounter Diagnoses   Name Primary?   ??? Breast cancer, stage 2 Yes       Doing well, starting chemo Tuesday.  I will see back in 6 months.

## 2012-12-21 NOTE — Progress Notes (Signed)
HISTORY OF PRESENT ILLNESS  Emily [name] is a 62 y.o. female.  HPI ESTABLISHED patient here for post op visit s/p LEFT breast skin sparing mastectomy with LEFT ALND and tissue expander reconstruction. Patient has been having a lot of pain the past several days ever since the last saline injection on Wednesday, so this morning Dr. Chen took off 50cc and patient is feeling relief from that. She is using dilaudid for pain control. She starts chemo on Tuesday. She will be receiving TC x 4.  She had a screening ultrasound on 09/24/12 showing a 2.6 cm left LN. 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 HER 2 negative IHC at 0.   She then had a MRI breast on 10/05/12 showing the 2.8 x 2 cm LN as well as a left breast LIQ 0.9 cm x 1.1 cm x 0.6 cm mass. Biopsy of that mass by FNA on 10/16/12 shows a poorly differentiated carcinoma. These slides were air-dried, HER 2 not able to be performed on them.   Mammaprint of LN shows high risk luminal, ER + at 0.19, PR + at 0.61, HER 2 negative at -0.69.   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 Receptors pending.     ROS    Physical Exam   Pulmonary/Chest:           ASSESSMENT and PLAN  Encounter Diagnoses   Name Primary?   ??? Breast cancer, stage 2 Yes       Doing well, starting chemo Tuesday.  I will see back in 6 months.

## 2012-12-25 LAB — CBC WITH 3 PART DIFF
ABS. LYMPHOCYTES: 1.6 10*3/uL (ref 0.8–3.5)
ABS. MIXED CELLS: 1.1 10*3/uL (ref 0.2–1.2)
ABS. NEUTROPHILS: 7.7 10*3/uL (ref 1.8–8.0)
HCT: 34.5 % — ABNORMAL LOW (ref 35.0–47.0)
HGB: 11.4 g/dL — ABNORMAL LOW (ref 11.5–16.0)
LYMPHOCYTES: 16 % (ref 12–49)
MCH: 29.7 PG (ref 26.0–34.0)
MCHC: 33 g/dL (ref 30.0–36.5)
MCV: 89.8 FL (ref 80.0–99.0)
Mixed cells: 11 % (ref 3.2–16.9)
NEUTROPHILS: 73 % (ref 32–75)
PLATELET: 240 10*3/uL (ref 150–400)
RBC: 3.84 M/uL (ref 3.80–5.20)
RDW: 12.9 % (ref 11.8–15.8)
WBC: 10.4 10*3/uL (ref 3.6–11.0)

## 2012-12-25 MED ADMIN — ondansetron (ZOFRAN) injection 8 mg: INTRAVENOUS | @ 16:00:00 | NDC 00409475503

## 2012-12-25 MED ADMIN — 0.9% sodium chloride infusion: INTRAVENOUS | @ 16:00:00 | NDC 00409798302

## 2012-12-25 MED ADMIN — heparin (porcine) pf 500 Units: INTRAVENOUS | @ 20:00:00

## 2012-12-25 MED ADMIN — sodium chloride (NS) flush 10 mL: INTRAVENOUS | @ 20:00:00 | NDC 87701099893

## 2012-12-25 MED ADMIN — cyclophosphamide (CYTOXAN) 975 mg, overfill volume 25 mL in 0.9% sodium chloride 250 mL chemo infusion: INTRAVENOUS | @ 18:00:00 | NDC 10019095550

## 2012-12-25 MED ADMIN — DOCEtaxel (TAXOTERE) 120 mg, overfill volume 25 mL in 0.9% sodium chloride 250 mL chemo infusion: INTRAVENOUS | @ 17:00:00 | NDC 66758005001

## 2012-12-25 MED ADMIN — dexamethasone (DECADRON) 4 mg/mL injection 20 mg: INTRAVENOUS | @ 13:00:00 | NDC 00517490525

## 2012-12-25 MED ADMIN — sodium chloride 0.9 % injection 10 mL: INTRAVENOUS | @ 13:00:00 | NDC 00409488810

## 2012-12-25 NOTE — Progress Notes (Signed)
[facility]  [address], [address]   [place], [place]   [address]  W: [phone]   F: [phone]      f/u HEME/ONC CONSULT      Reason for visit: evaluation for treatment for   Breast Cancer      HPI:   Emily [name] is a 62 y.o.  female who I was asked to see in consultation at the request of Dr. Maxwell Marion for evaluation for systemic therapy for breast cancer.      She had a screening ultrasound on 09/24/12 showing a 2.6 cm left LN.  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  HER 2 negative IHC at 0.    She has a history of a pheochromocytoma surgically removed in 1997.    She then had a MRI breast on 10/05/12 showing the 2.8 x 2 cm LN as well as a left breast LIQ 0.9 cm x 1.1 cm x 0.6 cm mass.  Biopsy of that mass by FNA on 10/16/12 shows a poorly differentiated carcinoma.  These slides were air-dried, HER 2 not able to be performed on them.    Mammaprint of LN shows high risk luminal, ER + at 0.19, PR + at 0.61, HER 2 negative at -0.69.    She underwent L lumpectomy on 11/28/12 showing IDC with neuroendocrine features, gr 2, o.5 cm, DCIS gr 2-3, + LVI, 1/14 LN + with a 1 cm met, no ECE.  HER 2 negative (ratio 1.2, sig/cell 2.8), ER + 95%, PR + 5%; ki67 5%; DCIS ER + 95%, PR + 1%.    Interval history:Here today to start chemo.  Continues with pain related to tissue expander. Very tearful today.  She reports being very anxious and much more tearful than usual.  Patient states she previously tried Lexapro (1 pill) but reports she got a migraine and does not want to try again.  Gr 2 loss of appetite, gr 2 fatigue.     DX   Encounter Diagnoses   Name Primary?   ??? Breast cancer, stage 2 Yes   ??? Pain    ??? Anxiety    ???  Depression       Past Medical History   Diagnosis Date   ??? Cancer      left breast  cancer   ??? Anxiety    ??? Arthritis    ??? Nausea & vomiting    ??? Concussion 2011   ??? Chronic tension headaches    ??? Headache      OCCAS   ??? Other ill-defined conditions      PHEOCHROMOCYTOMA     Past Surgical History   Procedure Laterality Date   ??? Pr breast surgery procedure unlisted  '91     breast implants   ??? Hx heent       T&A   ??? Hx heent       right adrenal removed   ??? Pr abdomen surgery proc unlisted       hernia mesh repair   ??? Hx appendectomy     ??? Hx gyn       c-sections   ??? Hx oophorectomy       right   ??? Hx orthopaedic  bilateral shoulder arthroscopy   ??? Hx modified radical mastectomy  11/28/2012     LEFT BREAST MODIFIED RADICAL MASTECTOMY W/ RECONSTRUCTION LEFT BREAST WITH TISSUE EXPANDER AND ALLODERM;PORTACATH INSERTION performed by Binnie Rail, MD at [facility] AMBULATORY OR   ??? Hx breast reconstruction  11/28/2012     BREAST RECONSTRUCTION /C  INSERTION EXPANDER & ALLODERM performed by Ivar Bury, MD at [facility] AMBUL[facility]ORY OR     History     Social History   ??? Marital Status: SINGLE     Spouse Name: N/A     Number of Children: N/A   ??? Years of Education: N/A     Social History Main Topics   ??? Smoking status: Never Smoker    ??? Smokeless tobacco: Never Used   ??? Alcohol Use: No   ??? Drug Use: No   ??? Sexually Active: Not on file     Other Topics Concern   ??? Not on file     Social History Narrative   ??? No narrative on file     Family History   Problem Relation Age of Onset   ??? Alzheimer Mother    ??? Diabetes Mother    ??? Other Mother      COMBATIVE POST ANESTHESIA   ??? Heart Disease Father    ??? Diabetes Father    ??? Lung Disease Father      COPD   ??? Headache Sister    ??? Migraines Sister        Current Outpatient Prescriptions   Medication Sig Dispense Refill   ??? oxyCODONE-acetaminophen (PERCOCET) 5-325 mg per tablet Take 1 Tab by mouth every four (4) hours as needed for Pain.  50 Tab  0   ??? ondansetron (ZOFRAN ODT) 8 mg  disintegrating tablet Take 1 Tab by mouth every eight (8) hours as needed for Nausea.  24 Tab  3   ??? citalopram (CELEXA) 20 mg tablet Take 1 Tab by mouth daily.  30 Tab  4   ??? promethazine (PHENERGAN) 12.5 mg tablet Take 1 Tab by mouth every six (6) hours as needed for Nausea.  10 Tab  1   ??? OTHER Cranial Prosthesis    Dx: 174.9  1 Each  0   ??? diazepam (VALIUM) 5 mg tablet Take 2.5 mg by mouth every six (6) hours as needed for Anxiety. Indications: MUSCLE SPASM       ??? DOCUSATE SODIUM (COLACE PO) Take  by mouth.       ??? polyethylene glycol (MIRALAX) 17 gram/dose powder Take 17 g by mouth daily.       ??? dexamethasone (DECADRON) 4 mg tablet Take 2 Tabs by mouth daily. 8 mg twice a day the day before and day after chemo  32 Tab  3   ??? lidocaine-prilocaine (EMLA) topical cream Apply  to affected area as needed for Pain.  30 g  0   ??? ondansetron hcl (ZOFRAN) 8 mg tablet Take 1 Tab by mouth every eight (8) hours as needed for Nausea.  60 Tab  3   ??? prochlorperazine (COMPAZINE) 10 mg tablet Take 1 Tab by mouth every six (6) hours as needed for Nausea.  50 Tab  5   ??? HYDROmorphone (DILAUDID) 2 mg tablet Take 2 Tabs by mouth every three (3) hours as needed (every 3-4 hours).  70 Tab  0     Facility-Administered Medications Ordered in Other Visits   Medication Dose Route Frequency Provider Last  Rate Last Dose   ??? sodium chloride 0.9 % injection 10 mL  10 mL IntraVENous PRN Carmina Miller, MD   10 mL at 12/25/12 0915   ??? heparin (porcine) pf 500 Units  500 Units IntraVENous PRN Carmina Miller, MD   500 Units at 12/25/12 1539   ??? sodium chloride (NS) flush 10 mL  10 mL IntraVENous PRN Carmina Miller, MD   10 mL at 12/25/12 1539   ??? [START ON 12/26/2012] pegfilgrastim (NEULASTA) injection 6 mg  6 mg SubCUTAneous ONCE Leotis Shames, MD       ??? 0.9% sodium chloride infusion  100 mL/hr IntraVENous CONTINUOUS Leotis Shames, MD   100 mL/hr at 12/25/12 1144   ??? [COMPLETED] ondansetron (ZOFRAN) injection 8 mg  8 mg  IntraVENous ONCE Leotis Shames, MD   8 mg at 12/25/12 1145   ??? [COMPLETED] dexamethasone (DECADRON) 4 mg/mL injection 20 mg  20 mg IntraVENous ONCE Leotis Shames, MD   20 mg at 12/25/12 0900   ??? [COMPLETED] DOCEtaxel (TAXOTERE) 120 mg, overfill volume 25 mL in 0.9% sodium chloride 250 mL chemo infusion  120 mg IntraVENous ONCE Leotis Shames, MD   120 mg at 12/25/12 1245   ??? [COMPLETED] cyclophosphamide (CYTOXAN) 975 mg, overfill volume 25 mL in 0.9% sodium chloride 250 mL chemo infusion  975 mg IntraVENous ONCE Leotis Shames, MD   975 mg at 12/25/12 1355       Allergies   Allergen Reactions   ??? Aspirin Unknown (comments)     Gi bleeding   ??? Compazine (Prochlorperazine) Rash   ??? Keflex (Cephalexin) Rash   ??? Nsaids (Non-Steroidal Anti-Inflammatory Drug) Swelling     Throat swells   ??? Prednisone Rash       Review of Systems    A comprehensive review of systems was performed and all systems were negative except for HPI and for the symptom report form, reviewed and scanned in.      Objective:  Physical Exam:  BP 131/70   Pulse 72   Temp(Src) 96.8 ??F (36 ??C) (Oral)   Resp 18   Ht 5\' 6"  (1.676 m)   Wt 124 lb (56.246 kg)   BMI 20.02 kg/m2   SpO2 96%    General:  Alert, cooperative, no distress, appears stated age.   Head:  Normocephalic, without obvious abnormality, atraumatic.   Eyes:  Conjunctivae/corneas clear. PERRL, EOMs intact.   Throat: Lips, mucosa, and tongue normal.    Neck: Supple, symmetrical, trachea midline, no adenopathy, thyroid: no enlargement/tenderness/nodules   Back:   Symmetric, no curvature. ROM normal. No CVA tenderness.   Lungs:   Clear to auscultation bilaterally.   Chest wall:  No tenderness or deformity.   Heart:  Regular rate and rhythm, S1, S2 normal, no murmur, click, rub or gallop.   Breast Exam:  S/P left mastectomies with implant reconstruction.   Abdomen:   Soft, non-tender. Bowel sounds normal. No masses,  No organomegaly.   Extremities: Extremities normal, atraumatic, no cyanosis  or edema.   Skin: Skin color, texture, turgor normal. No rashes or lesions. Healed incision to left axilla.    Lymph nodes: Cervical, supraclavicular nodes normal.   Neurologic: CNII-XII intact.         Diagnostic Imaging   Results for orders placed during the hospital encounter of 11/28/12   XR FLUOROSCOPY UNDER 60 MINUTES    Narrative **Final Report**  ICD Codes / Adm.Diagnosis: 174.9   / Malignant neoplasm of breast (    Examination:  CR FLUORO GUIDE UP TO 1 HR  - [id number] - Nov 28 2012  9:15AM  Accession No:  [id number]  Reason:  port a cath      REPORT:  Portable fluoroscopy was utilized in the Operating Room.        IMPRESSION:  PORTABLE FLUOROSCOPY.      Fluoro time:  0.066 minutes      vk            Signing/Reading Doctor: BSR ADMINISTRATOR [phone])    Approved: BSR ADMINISTRATOR [phone])  Nov 30 2012  4:18PM                                  Results for orders placed in visit on 10/22/12   PET BREAST CANCER DIAGNOSIS   10/12/12 PET:  Negative for distant mets  10/22/12 brain MRI:  negative    Lab Results  Lab Results   Component Value Date/Time    WBC 10.4 12/25/2012  9:45 AM    HGB 11.4 12/25/2012  9:45 AM    HCT 34.5 12/25/2012  9:45 AM    PLATELET 240 12/25/2012  9:45 AM    MCV 89.8 12/25/2012  9:45 AM       Lab Results   Component Value Date/Time    Sodium 139 11/21/2012  2:32 PM    Potassium 4.3 11/21/2012  2:32 PM    Chloride 103 11/21/2012  2:32 PM    CO2 31 11/21/2012  2:32 PM    Anion gap 5 11/21/2012  2:32 PM    Glucose 78 11/21/2012  2:32 PM    BUN 12 11/21/2012  2:32 PM    Creatinine 0.36 11/21/2012  2:32 PM    BUN/Creatinine ratio 33 11/21/2012  2:32 PM    GFR est AA >60 11/21/2012  2:32 PM    GFR est non-AA >60 11/21/2012  2:32 PM    Calcium 9.0 11/21/2012  2:32 PM    AST 23 11/21/2012  2:32 PM    Alk. phosphatase 93 11/21/2012  2:32 PM    Protein, total 7.0 11/21/2012  2:32 PM    Albumin 4.2 11/21/2012  2:32 PM    Globulin 2.8 11/21/2012  2:32 PM    A-G Ratio 1.5 11/21/2012  2:32 PM    ALT 41 11/21/2012  2:32 PM      Assessment/Plan:  62 y.o. female with a 0.5 cm left breast cancer, IDC with endocrine features, 1/14 LN +, gr 2, high risk mammaprint,  ER and PR +, HER 2 negative    PS 0    1. Breast cancer with neuroendocrine features, stage IIA, T1bN1a    With no further therapy, her RR is about 36%; endocrine therapy will decrease her RR to 19%; and 2nd gen chemo will decrease her RR to 12%; 3rd gen to 10%.  These #s may be higher with her high risk mammaprint.     We previously discussed the toxicities of TC chemotherapy in detail. This chemotherapy frequently causes a low white blood cell count and a small percent of patients require hospital admission for treatment of neutropenic fever. We explained that on occasion we consider the use of growth factors to minimize this risk. We explained to the patient that some side effects, if they occur, only last a few days including  nausea, vomiting, stomatitis, arthralgia, myalgia, and allergic reactions to Taxotere. We told the patient that severe nausea and vomiting were very uncommon and that some side effects, if they occur, will last longer: this includes hair loss, which will be seen in all patients treated with these agents and fatigue, which will be seen in most. The patient was also told that if she is having menstrual function that she could develop chemotherapy-induced amenorrhea and infertility. We also told the patient that there was a very small risk of acute leukemia. We also informed the patient that heart damage is rare with these agents    After this discussion, she is agreeable to TC x 4, starting today 12/25/12.    The patient was given presciptions for a wig, emla cream, decadron to take 8 mg bid the day before and day after chemo, zofran. Neulasta in [facility] the following day.    In the future, based on her pheo and now breast cancer history, I recommend genetic counseling.    2. Anxiety/Depression: Start Celexa 20mg  daily, will monitor closely and titrate up if  needed. Discussed in great detail today    3. Pain:  Continue Dilaudid, Valium and Percocet as directed by surgeon.  Due to location of surgeons office, patient was provided with refill of Percocet today #50.  Has been seen by lymphedema. Warned about constipation risks of narcotics.    >25 min were spent with this patient with > 50% of that time spent in face to face counseling.      Follow-up Disposition:  Return in 3 weeks (on 01/15/2013).    Dortha Kern, MD

## 2012-12-25 NOTE — Progress Notes (Signed)
Emily [name] is a 61 y.o. female who is being seen for a follow up for breast cancer.

## 2012-12-25 NOTE — Telephone Encounter (Signed)
Pt would like a call to explain when she is supposed to take the Claritin. She said she thinks it has to do with the Neulasta injection.  Please call. Thanks.

## 2012-12-25 NOTE — Progress Notes (Signed)
Per verbal order Dr. Elroy Channel Referral from radiation oncology was order for patient to see Dr. Dan Humphreys.

## 2012-12-25 NOTE — Progress Notes (Signed)
[facility] VISIT NOTE    0915  Pt arrived at [facility] ambulatory and in no distress for Cycle 1 of TC.  Assessment completed, pt c/o pain to left breast from surgery and some anxiety today about her first treatment.     Blood pressure 124/66, pulse 72, temperature 97.5 ??F (36.4 ??C), resp. rate 20, SpO2 100.00%.    Right chest port accessed with 0.75in huber; positive blood return noted; labs drawn.   Recent Results (from the past 12 hour(s))   CBC W/3 PART DIFF    Collection Time     12/25/12  9:45 AM       Result Value Range    WBC 10.4  3.6 - 11.0 K/uL    RBC 3.84  3.80 - 5.20 M/uL    HGB 11.4 (*) 11.5 - 16.0 g/dL    HCT 16.1 (*) 09.6 - 47.0 %    MCV 89.8  80.0 - 99.0 FL    MCH 29.7  26.0 - 34.0 PG    MCHC 33.0  30.0 - 36.5 g/dL    RDW 04.5  40.9 - 81.1 %    PLATELET 240  150 - 400 K/uL    NEUTROPHILS 73  32 - 75 %    MIXED CELLS 11  3.2 - 16.9 %    LYMPHOCYTES 16  12 - 49 %    ABS. NEUTROPHILS 7.7  1.8 - 8.0 K/UL    ABS. MIXED CELLS 1.1  0.2 - 1.2 K/uL    ABS. LYMPHOCYTES 1.6  0.8 - 3.5 K/UL    DF AUTOMATED       Medications received:  NS IV as ordered  Zofran 8mg  IVP  Decadron 20mg  IVP  Docetaxel 120mg  IV  Cytoxan 975mg  IV    Tolerated treatment well, no adverse reaction noted. Positive blood return maintained throughout and post treatment. Port flushed and de-accessed per protocol.     Blood pressure 104/52, pulse 74, temperature 98.3 ??F (36.8 ??C), resp. rate 18, height 5\' 6"  (1.676 m), weight 56.7 kg (125 lb), SpO2 99.00%.    1550   D/Cd from [facility] ambulatory and in no distress accompanied by husband.  Next appt 12/26/12 at 1500.

## 2012-12-25 NOTE — Progress Notes (Signed)
Problem: Chemotherapy Treatment  Goal: *Chemotherapy regimen followed  Outcome: Progressing Towards Goal  Patient to receive Cycle 1 of TC today as ordered.     Problem: Patient Education: Go to Education Activity  Goal: Patient/Family Education  Outcome: Progressing Towards Goal  CareNotes and education provided to patient and husband about Taxotere and Cytoxan. Opportunity for questions was provided and all questions were answered. Patient and husband verbalized understanding.

## 2012-12-25 NOTE — Patient Instructions (Signed)
Vitamin D3 2000 international unit    Take Clarinex daily.     Return in 3 weeks for next cycle.

## 2012-12-26 MED ADMIN — sodium chloride 0.9 % injection 10 mL: INTRAVENOUS | @ 20:00:00 | NDC 00409488810

## 2012-12-26 MED ADMIN — sodium chloride (NS) 0.9 % flush: @ 20:00:00 | NDC 87701099893

## 2012-12-26 MED ADMIN — pegfilgrastim (NEULASTA) injection 6 mg: SUBCUTANEOUS | @ 20:00:00 | NDC 55513019001

## 2012-12-26 MED ADMIN — LORazepam (ATIVAN) injection 1 mg: INTRAVENOUS | @ 20:00:00 | NDC 17478004001

## 2012-12-26 MED ADMIN — heparin (porcine) pf 100 unit/mL: @ 20:00:00

## 2012-12-26 MED ADMIN — ondansetron (ZOFRAN) injection 8 mg: INTRAVENOUS | @ 20:00:00 | NDC 55150012502

## 2012-12-26 NOTE — Telephone Encounter (Signed)
Pt spoke with NP in [facility] today concerning matter.

## 2012-12-26 NOTE — Progress Notes (Signed)
[facility] SHORT VISIT NOTE    1520  Pt arrived to [facility] ambulatory and in no distress for Neulasta.  Patient reports nausea and vomiting x3 today despite taking all prescribed nausea medications and inability to keep down food and drink. Spoke with Lonell Grandchild, NP informing her of patient complaints. Maralyn Sago came down to [facility] to assess patient and orders received to administer 1mg  of Ativan IV and 8mg  Zofran IV.     Right chest port accessed with 0.75in huber. Positive blood return noted; port flushed per protocol.     Medication given:  Zofran 8mg  IVP  Ativan 1mg  IVP  Neulasta 6mg  SQ    CareNotes and education provided to patient about Neulasta. Opportunity for questions was provided and all questions were answered. Patient and husband verbalized understanding. Patient remained in [facility] for 30 minutes after receiving medications for observation. After 30 minutes nausea resolved and patient able to keep down ginger ale and ice chips.     Blood pressure 101/63, pulse 75, temperature 97 ??F (36.1 ??C), resp. rate 16, SpO2 100.00%.    1640  Discharged home ambulatory and in no distress. Tolerated treatment well. Next appointment

## 2012-12-26 NOTE — Progress Notes (Signed)
Problem: Patient Education: Go to Education Activity  Goal: Patient/Family Education  Outcome: Progressing Towards Goal  CareNotes and education provided to patient about Neulasta. Opportunity for questions was provided and all questions were answered. Patient and husband verbalized understanding.

## 2012-12-26 NOTE — Telephone Encounter (Signed)
Spoke with pt and informed that she may take a half dose of steroid, versus whole, per Dr. Elroy Channel. Pt stated that she was having "horrible headache from steroids and Zofran".

## 2012-12-28 ENCOUNTER — Telehealth

## 2012-12-28 NOTE — Telephone Encounter (Signed)
Left voicemail for pt to call office back.

## 2012-12-28 NOTE — Telephone Encounter (Signed)
Pt called regarding effects of chemo. Please call [phone]

## 2012-12-28 NOTE — Telephone Encounter (Signed)
Called and left voicemail for pt to call office back.

## 2012-12-28 NOTE — Telephone Encounter (Signed)
Spoke with pt and she states that she is having mouth soreness and would like for magic mouthwash to be called into pharmacy and how long she should take Clarinex and anything for thigh pain. Informed pt, per Dr. Elroy Channel, that she could take Clarinex daily, magic mouthwash would be called into pharmacy, to add Ativan 1mg  TID, and add phenergan every 6 hours, eat small meals, take Percocet for thigh pain, and take Advil 2 tablets by mouth in the morning for the next 2 days. Pt states that she has not been taking the phenergan as this will make her too "groggy", as she has to take Valium for breast spasms, and she is unable to take any NSAIDS as this causes an allergic reaction. She states that she is trying to manage medications so that she is not too groggy. Advised pt that if anything was needed to call office. Pt voiced understanding.

## 2012-12-30 NOTE — Telephone Encounter (Signed)
Called with headache.  She has a history of headache, but it has been much worse on the chemotherapy.  Has also had some N/V.  Has an Rx for Maxalt from her neurologist, but is afraid to take it.  Hasn't eaten much in the past 4 days.  We discussed maximizing her pain and nausea medicines today. It would be nice to get her in for fluids tomorrow.  She should also check back in with her neurologist.

## 2012-12-31 MED ADMIN — sodium chloride 0.9 % bolus infusion 2,000 mL: INTRAVENOUS | @ 17:00:00 | NDC 00409798348

## 2012-12-31 NOTE — Progress Notes (Addendum)
1305 Pt arrived at [facility] ambulatory and in no distress for hydration, no new complaints voiced.  Blood pressure 122/64, pulse 82, temperature 98 ??F (36.7 ??C), resp. rate 16.    Medications received:    2000 ml NS over 2 hours  Blood pressure 110/60, pulse 83, temperature 98 ??F (36.7 ??C), resp. rate 16.    1530 Tolerated treatment well, no adverse reaction noted.  D/C'd from [facility] ambulatory and in no distress accompanied by friend.  Next appt 01/15/13 0900

## 2012-12-31 NOTE — Telephone Encounter (Signed)
Pt called and stated that when she left the [facility] on the way home from getting fluids, she states that she had nausea and vomited in the car. Pt states that she has taken a zofran, and would like for Zofran to be added into an IV for next fluid administration. Informed pt, per Dr. Elroy Channel, to alternate Zofran and Phenergan, taking one every 3 hours on an alternate basis. Also advised pt to make sure that she is eating small meals. Pt states that she will try. Informed pt that if she needed anything to call office. Pt voiced understanding.

## 2012-12-31 NOTE — Progress Notes (Signed)
Emily [name] is a 61 y.o. female f/u for breast cancer.

## 2012-12-31 NOTE — Progress Notes (Signed)
[facility]  [address], [address]   [place], [place]   [address]  W: [phone]   F: [phone]      f/u HEME/ONC CONSULT      Reason for visit: management of side effects, evaluation for treatment for   Breast Cancer      HPI:   Emily [name] is a 62 y.o.  female who I was asked to see in consultation at the request of Dr. Maxwell Marion for evaluation for systemic therapy for breast cancer.      She had a screening ultrasound on 09/24/12 showing a 2.6 cm left LN.  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  HER 2 negative IHC at 0.    She has a history of a pheochromocytoma surgically removed in 1997.    She then had a MRI breast on 10/05/12 showing the 2.8 x 2 cm LN as well as a left breast LIQ 0.9 cm x 1.1 cm x 0.6 cm mass.  Biopsy of that mass by FNA on 10/16/12 shows a poorly differentiated carcinoma.  These slides were air-dried, HER 2 not able to be performed on them.    Mammaprint of LN shows high risk luminal, ER + at 0.19, PR + at 0.61, HER 2 negative at -0.69.    She underwent L lumpectomy on 11/28/12 showing IDC with neuroendocrine features, gr 2, o.5 cm, DCIS gr 2-3, + LVI, 1/14 LN + with a 1 cm met, no ECE.  HER 2 negative (ratio 1.2, sig/cell 2.8), ER + 95%, PR + 5%; ki67 5%; DCIS ER + 95%, PR + 1%.    Interval history:s/p TC #1 on 12/25/12.  She complains of 4 issues since then.  Gr 3 nausea starting the day after chemo, she does state her grandson had a viral GI illness, states that zofran is improving it, but is not taking the phenergan as instructed on 3/28.  No vomiting, able to keep PO down.  Gr 2-3 loss of appetite.  Gr 2 hot flashes.  Her 2nd major issue is a headache that started over the weekend, is  7/10, gr 3, denies vision changes, has not had coffee since chemo started, tylenol is not making it better, not able to take nsaids.  Her 3rd issue is a rash that started this morning on her back, + itch.  Her 4th issue is lower back pain, 9/10 started at 3 am this morning, has not tried anything to make it better.  Did not take claritin yesterday.    Also complains of spasm in her left breast implant.  Is highly anxious today but denies it.    DX   Encounter Diagnosis   Name Primary?   ??? Breast cancer, stage 2 Yes      Past Medical History   Diagnosis Date   ??? Cancer      left breast  cancer   ??? Anxiety    ??? Arthritis    ??? Nausea & vomiting    ??? Concussion 2011   ??? Chronic tension headaches    ??? Headache      OCCAS   ??? Other ill-defined conditions      PHEOCHROMOCYTOMA     Past Surgical History   Procedure Laterality Date   ???  Pr breast surgery procedure unlisted  '91     breast implants   ??? Hx heent       T&A   ??? Hx heent       right adrenal removed   ??? Pr abdomen surgery proc unlisted       hernia mesh repair   ??? Hx appendectomy     ??? Hx gyn       c-sections   ??? Hx oophorectomy       right   ??? Hx orthopaedic       bilateral shoulder arthroscopy   ??? Hx modified radical mastectomy  11/28/2012     LEFT BREAST MODIFIED RADICAL MASTECTOMY W/ RECONSTRUCTION LEFT BREAST WITH TISSUE EXPANDER AND ALLODERM;PORTACATH INSERTION performed by Binnie Rail, MD at [facility] AM[facility] OR   ??? Hx breast reconstruction  11/28/2012     BREAST RECONSTRUCTION /C  INSERTION EXPANDER & ALLODERM performed by Ivar Bury, MD at [facility] AMBULATORY OR     History     Social History   ??? Marital Status: SINGLE     Spouse Name: N/A     Number of Children: N/A   ??? Years of Education: N/A     Social History Main Topics   ??? Smoking status: Never Smoker    ??? Smokeless tobacco: Never Used   ??? Alcohol Use: No   ??? Drug Use: No   ??? Sexually Active: Not on file     Other Topics Concern   ??? Not on file     Social History Narrative   ??? No narrative on file      Family History   Problem Relation Age of Onset   ??? Alzheimer Mother    ??? Diabetes Mother    ??? Other Mother      COMBATIVE POST ANESTHESIA   ??? Heart Disease Father    ??? Diabetes Father    ??? Lung Disease Father      COPD   ??? Headache Sister    ??? Migraines Sister        Current Outpatient Prescriptions   Medication Sig Dispense Refill   ??? codeine-butalbital-acetaminophen-caffeine (FIORICET WITH CODEINE) 50-325-40-30 mg per capsule Take 1 Cap by mouth every four (4) hours as needed for Headache.  30 Cap  1   ??? LORazepam (ATIVAN) 1 mg tablet Take 1 Tab by mouth every eight (8) hours as needed for Anxiety.  90 Tab  3   ??? magic mouthwash solution Magic mouth wash   Maalox  Lidocaine 2% viscous   Diphenhydramine oral solution   Pharmacy to mix equal portions of ingredients to a total volume as indicated in the dispense amount.  Swish and spit 15-21ml Q 4 hours as needed for mouth pain. May swallow for throat pain.  500 mL  2   ??? oxyCODONE-acetaminophen (PERCOCET) 5-325 mg per tablet Take 1 Tab by mouth every four (4) hours as needed for Pain.  50 Tab  0   ??? ondansetron (ZOFRAN ODT) 8 mg disintegrating tablet Take 1 Tab by mouth every eight (8) hours as needed for Nausea.  24 Tab  3   ??? citalopram (CELEXA) 20 mg tablet Take 1 Tab by mouth daily.  30 Tab  4   ??? promethazine (PHENERGAN) 12.5 mg tablet Take 1 Tab by mouth every six (6) hours as needed for Nausea.  10 Tab  1   ??? OTHER Cranial Prosthesis    Dx: 174.9  1 Each  0   ??? diazepam (VALIUM) 5 mg tablet Take 2.5 mg by mouth every six (6) hours as needed for Anxiety. Indications: MUSCLE SPASM       ??? DOCUSATE SODIUM (COLACE PO) Take  by mouth.       ??? polyethylene glycol (MIRALAX) 17 gram/dose powder Take 17 g by mouth daily.       ??? dexamethasone (DECADRON) 4 mg tablet Take 2 Tabs by mouth daily. 8 mg twice a day the day before and day after chemo  32 Tab  3   ??? lidocaine-prilocaine (EMLA) topical cream Apply  to affected area as needed for Pain.  30 g  0   ???  ondansetron hcl (ZOFRAN) 8 mg tablet Take 1 Tab by mouth every eight (8) hours as needed for Nausea.  60 Tab  3   ??? prochlorperazine (COMPAZINE) 10 mg tablet Take 1 Tab by mouth every six (6) hours as needed for Nausea.  50 Tab  5   ??? HYDROmorphone (DILAUDID) 2 mg tablet Take 2 Tabs by mouth every three (3) hours as needed (every 3-4 hours).  70 Tab  0     Facility-Administered Medications Ordered in Other Visits   Medication Dose Route Frequency Provider Last Rate Last Dose   ??? sodium chloride 0.9 % bolus infusion 2,000 mL  2,000 mL IntraVENous ONCE Kenna Gilbert, NP       ??? sodium chloride (NS) 0.9 % flush            ??? sodium chloride 0.9 % injection            ??? heparin (porcine) pf 100 unit/mL                Allergies   Allergen Reactions   ??? Aspirin Unknown (comments)     Gi bleeding   ??? Compazine (Prochlorperazine) Rash   ??? Keflex (Cephalexin) Rash   ??? Nsaids (Non-Steroidal Anti-Inflammatory Drug) Swelling     Throat swells   ??? Prednisone Rash       Review of Systems    A comprehensive review of systems was performed and all systems were negative except for HPI and for the symptom report form, reviewed and scanned in.      Objective:  Physical Exam:  BP 112/51   Pulse 83   Temp(Src) 97.6 ??F (36.4 ??C) (Oral)   Resp 14   Ht 5\' 6"  (1.676 m)   Wt 121 lb 3.2 oz (54.976 kg)   BMI 19.57 kg/m2   SpO2 98%    General:  Alert, cooperative, no distress, appears stated age.   Abdomen:   Soft, non-tender. Bowel sounds normal. No masses,  No organomegaly.   Extremities: Extremities normal, atraumatic, no cyanosis or edema.   Skin: Macular rash on lower and mid back no vesicles   Neurologic: CNII-XII intact.         Diagnostic Imaging   Results for orders placed during the hospital encounter of 11/28/12   XR FLUOROSCOPY UNDER 60 MINUTES    Narrative **Final Report**       ICD Codes / Adm.Diagnosis: 174.9   / Malignant neoplasm of breast (    Examination:  CR FLUORO GUIDE UP TO 1 HR  - [id number] - Nov 28 2012  9:15AM  Accession  No:  [id number]  Reason:  port a cath      REPORT:  Portable fluoroscopy was utilized in the Operating Room.  IMPRESSION:  PORTABLE FLUOROSCOPY.      Fluoro time:  0.066 minutes      vk            Signing/Reading Doctor: BSR ADMINISTRATOR [phone])    Approved: BSR ADMINISTRATOR [phone])  Nov 30 2012  4:18PM                                  Results for orders placed in visit on 10/22/12   PET BREAST CANCER DIAGNOSIS   10/12/12 PET:  Negative for distant mets  10/22/12 brain MRI:  negative    Lab Results  Lab Results   Component Value Date/Time    WBC 10.4 12/25/2012  9:45 AM    HGB 11.4 12/25/2012  9:45 AM    HCT 34.5 12/25/2012  9:45 AM    PLATELET 240 12/25/2012  9:45 AM    MCV 89.8 12/25/2012  9:45 AM       Lab Results   Component Value Date/Time    Sodium 139 11/21/2012  2:32 PM    Potassium 4.3 11/21/2012  2:32 PM    Chloride 103 11/21/2012  2:32 PM    CO2 31 11/21/2012  2:32 PM    Anion gap 5 11/21/2012  2:32 PM    Glucose 78 11/21/2012  2:32 PM    BUN 12 11/21/2012  2:32 PM    Creatinine 0.36 11/21/2012  2:32 PM    BUN/Creatinine ratio 33 11/21/2012  2:32 PM    GFR est AA >60 11/21/2012  2:32 PM    GFR est non-AA >60 11/21/2012  2:32 PM    Calcium 9.0 11/21/2012  2:32 PM    AST 23 11/21/2012  2:32 PM    Alk. phosphatase 93 11/21/2012  2:32 PM    Protein, total 7.0 11/21/2012  2:32 PM    Albumin 4.2 11/21/2012  2:32 PM    Globulin 2.8 11/21/2012  2:32 PM    A-G Ratio 1.5 11/21/2012  2:32 PM    ALT 41 11/21/2012  2:32 PM     Assessment/Plan:  62 y.o. female with a 0.5 cm left breast cancer, IDC with endocrine features, 1/14 LN +, gr 2, high risk mammaprint,  ER and PR +, HER 2 negative    PS 0    1. Breast cancer with neuroendocrine features, stage IIA, [id number]    With no further therapy, her RR is about 36%; endocrine therapy will decrease her RR to 19%; and 2nd gen chemo will decrease her RR to 12%; 3rd gen to 10%.  These #s may be higher with her high risk mammaprint.     After this discussion, she is agreeable to TC x 4, started  12/25/12. Goal is cure.    The patient was given presciptions for a wig, emla cream, decadron to take 8 mg bid the day before and day after chemo, zofran. Neulasta in opic the following day.    In the future, based on her pheo and now breast cancer history, I recommend genetic counseling.    2. Anxiety/Depression: discussed taking deep breaths when anxious; Start Celexa 20mg  daily, will monitor closely and titrate up if needed. Discussed in great detail today as she had not started it, but she did start this today in office.  For anxiety, ativan 1 mg tid    3. Headache:  Fioricet; warned about constipation, # 30 given with 1 refill  4. Back pain/breast spasm:  Advised to stop valium and only take ativan for spasm; claritin every day; 2 mg dex today and tomorrow morning; fioricet as needed    5. Rash:  Due to docetaxel; 2 mg dex as above; benadryl as needed for every 6 hours; benadryl cream    6. Nausea:  zofran tid; phenergan q 6hs alternating; may add aloxi at next cycle    Discussed that she should only be receiving narcotics and anxiety meds from one provider (me).    > 40 min were spent with this patient with > 50% of that time spent in face to face counseling on the above.      Follow-up Disposition: Not on File    Dortha Kern, MD

## 2012-12-31 NOTE — Telephone Encounter (Signed)
Pt scheduled to have fluids and be seen by Dr. Elroy Channel today on the office.

## 2012-12-31 NOTE — Telephone Encounter (Signed)
Pt called. Emily [name] is having headaches and back pain. Dr Alisa Graff advised her to go to the ER over the weekend but Emily [name] preferred to see Dr Elroy Channel. Please call [phone].

## 2012-12-31 NOTE — Patient Instructions (Addendum)
Back pain:  claritin every day; 2 mg dexamethasone today and Tuesday morning; fioricet as needed    Rash:  2 mg dex as above; benadryl as needed every 6 hours, also benadryl cream    Headache:  fioricet    Nausea:  zofran 3 times a day; pherergan every 6 hours; so if start at 7 am with zofran then 4 hours later phenergan, 4 hours later zofran, etc    Spasm:  Take ativan 3 times a day    Started celexa

## 2012-12-31 NOTE — Telephone Encounter (Signed)
Called and spoke with pt's infusion nurse, Fayrene Fearing, and informed of appointment made for pt to see Radiation Oncology on 4/16 at 1pm with Dr. Dan Humphreys. Fayrene Fearing states that he will inform pt of appointment.

## 2013-01-01 ENCOUNTER — Telehealth

## 2013-01-01 NOTE — Telephone Encounter (Signed)
Refill sent into pt's pharmacy, per Dr. Elroy Channel approval.

## 2013-01-01 NOTE — Telephone Encounter (Signed)
Called pt to see how she was doing today. Pt reports she is doing much better--headache and back pain are much improved, nausea is slowly improving. Pt awaiting prior auth from our office so she can pick up Zofran.     Pt with no new complaints. Encouraged pt to call with questions, concerns. Pt voiced understanding.

## 2013-01-01 NOTE — Telephone Encounter (Signed)
Pt states alternating nausea medications helped last night. She used all of her zofran and would like a refill this morning so that she can continue to alternate. Send to [facility] on [address] in [place].

## 2013-01-03 NOTE — Telephone Encounter (Signed)
Pt called and has questions regarding her treatment. Please call [phone]

## 2013-01-03 NOTE — Telephone Encounter (Signed)
Pt states that she has questions concerning her treatment that she would like to Dr Elroy Channel about. Pt states that she was just here face to face and doesn't want to stop treatment, but has questions concerning current treatment that she would like to discuss with him. Cal back number is [phone].

## 2013-01-03 NOTE — Telephone Encounter (Signed)
Left voicemail for pt to call office back.

## 2013-01-03 NOTE — Telephone Encounter (Signed)
Pt spoke with Dr. Elroy Channel and had questions answered.

## 2013-01-04 ENCOUNTER — Encounter

## 2013-01-06 NOTE — Telephone Encounter (Signed)
Heme/Onc On Call Note  Patient called in a panic.  She just realized that she is all out of her oxycodone's and she doesn't know how she'll make it through the next 24 hours.  Office not open to come pick up another script.  I called in norco for me, #30 with no refills, and advised her to call the office tomorrow to see about arranging to come in for new script.

## 2013-01-07 ENCOUNTER — Telehealth

## 2013-01-07 NOTE — Telephone Encounter (Signed)
Spoke with Emily [name] and informed that prescription has been signed and placed at the front desk for pickup. Advised Emily [name] that the only person on her HIPPA list that could pick up prescription is Gala Romney, her significant other. Emily [name] voiced understanding and states that she will come and pick up prescription.

## 2013-01-07 NOTE — Telephone Encounter (Signed)
Pt prescription for oxycodone ran out over the weekend. Please call [phone]

## 2013-01-07 NOTE — Telephone Encounter (Signed)
Pt informed when she came to pick up prescription that Dr. Tilman Neat office would refer her to closer radistion oncology practice once they've seen her on 4/16. Pt voiced understanding.

## 2013-01-07 NOTE — Telephone Encounter (Signed)
Pt has questions regarding her radiation and finding somewhere in [place] for it.

## 2013-01-09 ENCOUNTER — Telehealth

## 2013-01-09 NOTE — Telephone Encounter (Signed)
Pt called to report that she now has 3 painful fever blisters on her lip. Denies any sores within her mouth,denies swelling or difficulty swallowing and is afebrile. Pt reported that she has previously was prescribed valtrex and wanted to know if Dr.Irvin wanted to prescribe this- per MD advised her new prescription will be called in for valtrex 50 mg BID for 3 days then 500 mg daily until she returns to see [place] she verbalized understanding.

## 2013-01-14 NOTE — Telephone Encounter (Signed)
Left voicemail for pt to call office back.

## 2013-01-14 NOTE — Telephone Encounter (Signed)
Patient called again because she would like a call back about her medication before she receives chemo tomorrow. The contact number is [phone].

## 2013-01-14 NOTE — Telephone Encounter (Signed)
Patient called because she is scheduled for chemo tomorrow and wanted to discuss the desamethasone. She stated that she is putting on the patch right now. She would like a call back as soon as possible. The contact number is [phone].

## 2013-01-14 NOTE — Telephone Encounter (Signed)
Spoke with pt and informed that Dr. Elroy Channel would like for pt to take one steroid pill now, and one tonight before bed. He states that this will help with side effects. Pt voiced understanding.

## 2013-01-15 LAB — CBC WITH 3 PART DIFF
ABS. LYMPHOCYTES: 1.1 10*3/uL (ref 0.8–3.5)
ABS. MIXED CELLS: 0.9 10*3/uL (ref 0.2–1.2)
ABS. NEUTROPHILS: 8.7 10*3/uL — ABNORMAL HIGH (ref 1.8–8.0)
HCT: 33 % — ABNORMAL LOW (ref 35.0–47.0)
HGB: 10.9 g/dL — ABNORMAL LOW (ref 11.5–16.0)
LYMPHOCYTES: 11 % — ABNORMAL LOW (ref 12–49)
MCH: 29.9 PG (ref 26.0–34.0)
MCHC: 33 g/dL (ref 30.0–36.5)
MCV: 90.7 FL (ref 80.0–99.0)
Mixed cells: 9 % (ref 3.2–16.9)
NEUTROPHILS: 81 % — ABNORMAL HIGH (ref 32–75)
PLATELET: 425 10*3/uL — ABNORMAL HIGH (ref 150–400)
RBC: 3.64 M/uL — ABNORMAL LOW (ref 3.80–5.20)
RDW: 14.8 % (ref 11.8–15.8)
WBC: 10.7 10*3/uL (ref 3.6–11.0)

## 2013-01-15 MED ADMIN — sodium chloride (NS) flush 5-10 mL: INTRAVENOUS | @ 14:00:00 | NDC 87701099893

## 2013-01-15 MED ADMIN — sodium chloride (NS) flush 5-10 mL: INTRAVENOUS | @ 19:00:00 | NDC 87701099893

## 2013-01-15 MED ADMIN — dexamethasone (DECADRON) 4 mg/mL injection 20 mg: INTRAVENOUS | @ 16:00:00 | NDC 00069454301

## 2013-01-15 MED ADMIN — fosaprepitant (EMEND) 150 mg in 0.9% sodium chloride 150 mL IVPB: INTRAVENOUS | @ 16:00:00 | NDC 00409798361

## 2013-01-15 MED ADMIN — LORazepam (ATIVAN) injection 2 mg: INTRAVENOUS | @ 16:00:00 | NDC 17478004001

## 2013-01-15 MED ADMIN — cyclophosphamide (CYTOXAN) 975 mg in 0.9% sodium chloride 250 mL, overfill volume 25 mL chemo infusion: INTRAVENOUS | @ 18:00:00 | NDC 10019095550

## 2013-01-15 MED ADMIN — palonosetron HCl (ALOXI) injection 0.25 mg: INTRAVENOUS | @ 16:00:00 | NDC 62856079701

## 2013-01-15 MED ADMIN — sodium chloride 0.9 % injection 10 mL: INTRAVENOUS | @ 14:00:00 | NDC 00409488810

## 2013-01-15 MED ADMIN — DOCEtaxel (TAXOTERE) 120 mg in 0.9% sodium chloride 250 mL, overfill volume 25 mL chemo infusion: INTRAVENOUS | @ 17:00:00 | NDC 66758005001

## 2013-01-15 MED ADMIN — 0.9% sodium chloride infusion: INTRAVENOUS | @ 16:00:00 | NDC 00409798303

## 2013-01-15 MED ADMIN — heparin (porcine) pf 500 Units: INTRAVENOUS | @ 19:00:00

## 2013-01-15 NOTE — Patient Instructions (Addendum)
Dexamethasone 8 mg twice a day the day before and day after chemo    Clonazepam 0.5 mg twice a day

## 2013-01-15 NOTE — Progress Notes (Signed)
[facility]  [address], [address]   [place], [place]   [address]  W: [phone]   F: [phone]      f/u HEME/ONC CONSULT      Reason for visit: management of side effects, evaluation for treatment for   Breast Cancer      HPI:   Emily [name] is a 62 y.o.  female who I was asked to see in consultation at the request of Dr. Maxwell Marion for evaluation for systemic therapy for breast cancer.      She had a screening ultrasound on 09/24/12 showing a 2.6 cm left LN.  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  HER 2 negative IHC at 0.    She has a history of a pheochromocytoma surgically removed in 1997.    She then had a MRI breast on 10/05/12 showing the 2.8 x 2 cm LN as well as a left breast LIQ 0.9 cm x 1.1 cm x 0.6 cm mass.  Biopsy of that mass by FNA on 10/16/12 shows a poorly differentiated carcinoma.  These slides were air-dried, HER 2 not able to be performed on them.    Mammaprint of LN shows high risk luminal, ER + at 0.19, PR + at 0.61, HER 2 negative at -0.69.    She underwent L lumpectomy on 11/28/12 showing IDC with neuroendocrine features, gr 2, o.5 cm, DCIS gr 2-3, + LVI, 1/14 LN + with a 1 cm met, no ECE.  HER 2 negative (ratio 1.2, sig/cell 2.8), ER + 95%, PR + 5%; ki67 5%; DCIS ER + 95%, PR + 1%.    Interval history:s/p TC #1 on 12/25/12. Last week went much better as compared to the first week after chemo.  Gr 2 loss of appetite, gr 1 constipation, gr 2 fatigue, gr 2 nausea (improved now), no vomiting this week, gr 1 hot flashes, gr 2 anxiety, 4/10 left breast pain, gr 1 mucositis, gr 1 SOB, gr 2 headache (resolved).  No flushing with steroids last night.    Was seen by lymphedema and used a "lymphedema  machine" that helped her left breast pain.  Takes 2 percocet a day for this pain.        DX   Encounter Diagnoses   Name Primary?   ??? Breast cancer, stage 2 Yes   ??? Pain       Past Medical History   Diagnosis Date   ??? Cancer      left breast  cancer   ??? Anxiety    ??? Arthritis    ??? Nausea & vomiting    ??? Concussion 2011   ??? Chronic tension headaches    ??? Headache      OCCAS   ??? Other ill-defined conditions      PHEOCHROMOCYTOMA     Past Surgical History   Procedure Laterality Date   ??? Pr breast surgery procedure unlisted  '91     breast implants   ??? Hx heent       T&A   ??? Hx heent       right adrenal removed   ??? Pr abdomen surgery proc unlisted       hernia mesh repair   ??? Hx appendectomy     ???  Hx gyn       c-sections   ??? Hx oophorectomy       right   ??? Hx orthopaedic       bilateral shoulder arthroscopy   ??? Hx modified radical mastectomy  11/28/2012     LEFT BREAST MODIFIED RADICAL MASTECTOMY W/ RECONSTRUCTION LEFT BREAST WITH TISSUE EXPANDER AND ALLODERM;PORTACATH INSERTION performed by Binnie Rail, MD at [facility] AMBULATORY OR   ??? Hx breast reconstruction  11/28/2012     BREAST RECONSTRUCTION /C  INSERTION EXPANDER & ALLODERM performed by Ivar Bury, MD at [facility] AMBULATORY OR     History     Social History   ??? Marital Status: SINGLE     Spouse Name: N/A     Number of Children: N/A   ??? Years of Education: N/A     Social History Main Topics   ??? Smoking status: Never Smoker    ??? Smokeless tobacco: Never Used   ??? Alcohol Use: No   ??? Drug Use: No   ??? Sexually Active: Not on file     Other Topics Concern   ??? Not on file     Social History Narrative   ??? No narrative on file     Family History   Problem Relation Age of Onset   ??? Alzheimer Mother    ??? Diabetes Mother    ??? Other Mother      COMBATIVE POST ANESTHESIA   ??? Heart Disease Father    ??? Diabetes Father    ??? Lung Disease Father      COPD   ??? Headache Sister    ??? Migraines Sister        Current Outpatient Prescriptions   Medication Sig Dispense Refill   ??? OTHER  cranial prosthesis  1 Each  0   ??? oxyCODONE-acetaminophen (PERCOCET) 5-325 mg per tablet Take 1 Tab by mouth every four (4) hours as needed for Pain.  50 Tab  0   ??? LORazepam (ATIVAN) 1 mg tablet Take 1 Tab by mouth every eight (8) hours as needed for Anxiety.  90 Tab  3   ??? clonazePAM (KLONOPIN) 0.5 mg tablet Take 1 Tab by mouth two (2) times a day.  60 Tab  2   ??? valACYclovir (VALTREX) 500 mg tablet Take 1 tab by mouth two (2) times a day for 3 days then 500mg  once daily.  30 Tab  0   ??? HYDROcodone-acetaminophen (NORCO) 5-325 mg per tablet Take 1 or 2 tabs every 6 hours as needed for pain  30 Tab  0   ??? Granisetron (SANCUSO) 3.1 mg/24 hour ptwk Apply one patch to upper arm 24 hours prior to chemotherapy and leave in place for 7 days.  1 Patch  2   ??? ondansetron (ZOFRAN ODT) 8 mg disintegrating tablet Take 1 Tab by mouth every eight (8) hours as needed for Nausea.  24 Tab  3   ??? codeine-butalbital-acetaminophen-caffeine (FIORICET WITH CODEINE) 50-325-40-30 mg per capsule Take 1 Cap by mouth every four (4) hours as needed for Headache.  30 Cap  1   ??? magic mouthwash solution Magic mouth wash   Maalox  Lidocaine 2% viscous   Diphenhydramine oral solution   Pharmacy to mix equal portions of ingredients to a total volume as indicated in the dispense amount.  Swish and spit 15-32ml Q 4 hours as needed for mouth pain. May swallow for throat pain.  500 mL  2   ???  citalopram (CELEXA) 20 mg tablet Take 1 Tab by mouth daily.  30 Tab  4   ??? promethazine (PHENERGAN) 12.5 mg tablet Take 1 Tab by mouth every six (6) hours as needed for Nausea.  10 Tab  1   ??? OTHER Cranial Prosthesis    Dx: 174.9  1 Each  0   ??? DOCUSATE SODIUM (COLACE PO) Take  by mouth.       ??? polyethylene glycol (MIRALAX) 17 gram/dose powder Take 17 g by mouth daily.       ??? dexamethasone (DECADRON) 4 mg tablet Take 2 Tabs by mouth daily. 8 mg twice a day the day before and day after chemo  32 Tab  3   ??? lidocaine-prilocaine (EMLA) topical cream Apply  to  affected area as needed for Pain.  30 g  0   ??? ondansetron hcl (ZOFRAN) 8 mg tablet Take 1 Tab by mouth every eight (8) hours as needed for Nausea.  60 Tab  3   ??? prochlorperazine (COMPAZINE) 10 mg tablet Take 1 Tab by mouth every six (6) hours as needed for Nausea.  50 Tab  5   ??? HYDROmorphone (DILAUDID) 2 mg tablet Take 2 Tabs by mouth every three (3) hours as needed (every 3-4 hours).  70 Tab  0     Facility-Administered Medications Ordered in Other Visits   Medication Dose Route Frequency Provider Last Rate Last Dose   ??? sodium chloride (NS) flush 5-10 mL  5-10 mL IntraVENous PRN Carmina Miller, MD   10 mL at 01/15/13 0953   ??? heparin (porcine) pf 500 Units  500 Units IntraVENous PRN Carmina Miller, MD       ??? sodium chloride 0.9 % injection 10 mL  10 mL IntraVENous PRN Carmina Miller, MD   10 mL at 01/15/13 0950   ??? [START ON 01/16/2013] pegfilgrastim (NEULASTA) injection 6 mg  6 mg SubCUTAneous ONCE Leotis Shames, MD       ??? 0.9% sodium chloride infusion  250 mL/hr IntraVENous CONTINUOUS Leotis Shames, MD 250 mL/hr at 01/15/13 1138 250 mL/hr at 01/15/13 1138   ??? [COMPLETED] palonosetron HCl (ALOXI) injection 0.25 mg  0.25 mg IntraVENous ONCE Leotis Shames, MD   0.25 mg at 01/15/13 1148   ??? [COMPLETED] fosaprepitant (EMEND) 150 mg in 0.9% sodium chloride 150 mL IVPB  150 mg IntraVENous ONCE Leotis Shames, MD   150 mg at 01/15/13 1216   ??? [COMPLETED] LORazepam (ATIVAN) injection 2 mg  2 mg IntraVENous ONCE Leotis Shames, MD   2 mg at 01/15/13 1142   ??? cyclophosphamide (CYTOXAN) 975 mg in 0.9% sodium chloride 250 mL, overfill volume 25 mL chemo infusion  975 mg IntraVENous ONCE Leotis Shames, MD       ??? DOCEtaxel (TAXOTERE) 120 mg in 0.9% sodium chloride 250 mL, overfill volume 25 mL chemo infusion  120 mg IntraVENous ONCE Leotis Shames, MD   120 mg at 01/15/13 1304   ??? [COMPLETED] dexamethasone (DECADRON) 4 mg/mL injection 20 mg  20 mg IntraVENous ONCE Leotis Shames, MD   20 mg at  01/15/13 1154   ??? [DISCONTINUED] 0.9% sodium chloride infusion  100 mL/hr IntraVENous CONTINUOUS Leotis Shames, MD       ??? [DISCONTINUED] ondansetron [facility]) injection 8 mg  8 mg IntraVENous ONCE Leotis Shames, MD           Allergies   Allergen Reactions   ???  Aspirin Unknown (comments)     Gi bleeding   ??? Compazine (Prochlorperazine) Rash   ??? Keflex (Cephalexin) Rash   ??? Nsaids (Non-Steroidal Anti-Inflammatory Drug) Swelling     Throat swells   ??? Prednisone Rash       Review of Systems    A comprehensive review of systems was performed and all systems were negative except for HPI and for the symptom report form, reviewed and scanned in.      Objective:  Physical Exam:  BP 128/71   Pulse 71   Temp(Src) 97.6 ??F (36.4 ??C) (Oral)   Resp 15   Ht 5\' 6"  (1.676 m)   Wt 121 lb 9.6 oz (55.157 kg)   BMI 19.64 kg/m2   SpO2 100%    General:  Alert, cooperative, no distress, appears stated age.   Head:  Normocephalic, without obvious abnormality, atraumatic.   Eyes:  Conjunctivae/corneas clear. PERRL, EOMs intact.   Throat: Lips, mucosa, and tongue normal.    Neck: Supple, symmetrical, trachea midline, no adenopathy, thyroid: no enlargement/tenderness/nodules   Back:   Symmetric, no curvature. ROM normal. No CVA tenderness.   Lungs:   Clear to auscultation bilaterally.   Chest wall:  No tenderness or deformity.   Heart:  Regular rate and rhythm, S1, S2 normal, no murmur, click, rub or gallop.   Abdomen:   Soft, non-tender. Bowel sounds normal. No masses,  No organomegaly.   Extremities: Extremities normal, atraumatic, no cyanosis or edema.   Skin: Skin color, texture, turgor normal. No rashes or lesions.   Lymph nodes: Cervical, supraclavicular, and axillary nodes normal.   Neurologic: CNII-XII intact.                             Diagnostic Imaging   Results for orders placed during the hospital encounter of 11/28/12   XR FLUOROSCOPY UNDER 60 MINUTES    Narrative **Final Report**       ICD Codes / Adm.Diagnosis: 174.9   /  Malignant neoplasm of breast (    Examination:  CR FLUORO GUIDE UP TO 1 HR  - [id number] - Nov 28 2012  9:15AM  Accession No:  [id number]  Reason:  port a cath      REPORT:  Portable fluoroscopy was utilized in the Operating Room.        IMPRESSION:  PORTABLE FLUOROSCOPY.      Fluoro time:  0.066 minutes      vk            Signing/Reading Doctor: BSR ADMINISTRATOR [phone])    Approved: BSR ADMINISTRATOR [phone])  Nov 30 2012  4:18PM                                  Results for orders placed in visit on 10/22/12   PET BREAST CANCER DIAGNOSIS   10/12/12 PET:  Negative for distant mets  10/22/12 brain MRI:  negative    Lab Results  Lab Results   Component Value Date/Time    WBC 10.7 01/15/2013  9:50 AM    HGB 10.9 01/15/2013  9:50 AM    HCT 33.0 01/15/2013  9:50 AM    PLATELET 425 01/15/2013  9:50 AM    MCV 90.7 01/15/2013  9:50 AM       Lab Results   Component Value Date/Time    Sodium 139 11/21/2012  2:32  PM    Potassium 4.3 11/21/2012  2:32 PM    Chloride 103 11/21/2012  2:32 PM    CO2 31 11/21/2012  2:32 PM    Anion gap 5 11/21/2012  2:32 PM    Glucose 78 11/21/2012  2:32 PM    BUN 12 11/21/2012  2:32 PM    Creatinine 0.36 11/21/2012  2:32 PM    BUN/Creatinine ratio 33 11/21/2012  2:32 PM    GFR est AA >60 11/21/2012  2:32 PM    GFR est non-AA >60 11/21/2012  2:32 PM    Calcium 9.0 11/21/2012  2:32 PM    AST 23 11/21/2012  2:32 PM    Alk. phosphatase 93 11/21/2012  2:32 PM    Protein, total 7.0 11/21/2012  2:32 PM    Albumin 4.2 11/21/2012  2:32 PM    Globulin 2.8 11/21/2012  2:32 PM    A-G Ratio 1.5 11/21/2012  2:32 PM    ALT 41 11/21/2012  2:32 PM     Assessment/Plan:  62 y.o. female with a 0.5 cm left breast cancer, IDC with endocrine features, 1/14 LN +, gr 2, high risk mammaprint,  ER and PR +, HER 2 negative    PS 0    1. Breast cancer with neuroendocrine features, stage IIA, [id number]    With no further therapy, her RR is about 36%; endocrine therapy will decrease her RR to 19%; and 2nd gen chemo will decrease her RR to 12%; 3rd gen to  10%.  These #s may be higher with her high risk mammaprint.     She is agreeable to TC x 4, started 12/25/12. Goal is cure.  #2 today.    The patient was given presciptions for a wig, emla cream, decadron to take 8 mg bid the day before and day after chemo.  Neulasta in opic the following day.    In the future, based on her pheo and now breast cancer history, I recommend genetic counseling.      2. Anxiety/Depression: continue celexa 20 mg daily.  She states this has improved her depression.  Continue ativan 1 mg in the evening and 0.5 mg qhs.  Will add clonazepam 0.5 mg bid.    3. Headache:  Fioricet; warned about constipation, pr, improved    4. Back pain/breast spasm:  Continue percocet; will refer back to Dr. Imogene Burn    5. Nausea:  sancuso patch; emend and aloxi added today; phenergan prn    6. Constipation: colace and senna prn    Discussed that she should only be receiving narcotics and anxiety meds from one provider (me).    > 25 min were spent with this patient with > 50% of that time spent in face to face counseling on the above.      Follow-up Disposition:  Return in 3 weeks (on 02/05/2013).    Dortha Kern, MD

## 2013-01-15 NOTE — Progress Notes (Signed)
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 She is taking time off for days of treatment and other times as needed. Pt reports her employer is providing her a flexible work schedule to accommodate her treatment needs.     Marital Status/Family Status:    Pt is a single mother of two (one adult son and a 60 year old son). Pt is in a long-standing relationship with her significant other, Douglas.     Current Support:   Pt reports she has a great support system. She has many friends and close coworkers who provide ongoing emotional and practical support to pt as needed. Pt is from the [place], [place] area and stays overnight in a local hotel on chemotherapy days; her significant other is with her during this time to provide support and attention to pt.     Has patient currently seeing a therapist or psychiatrist (or in the past):   Discussed pt to consider outpatient psychotherapy to help with adjustment to diagnosis and treatment plan. Pt is open to this, but feels overwhelmed with the amount of appointments she has at this point during chemotherapy and wishes to defer this until later along her treatment plan. Pt has been prescribed anti-depressant and anxiolytics per PCP and medical oncologists.     List medications for depression, anxiety, etc:   Celexa, klonopin, ativan    Barriers to Care:   Pt's emotional fragility and anxiety has been identified as a barrier to care and is being addressed on an ongoing basis.     Expectations/Goals of Care:   Chemotherapy, support    Plan:   1. Continue to follow to provide ongoing emotional support   2. Encourage pt to seek outpatient psychotherapy support along her treatment plan  3. Continue to address any barriers  to care and make referrals accordingly     Thank you for this referral.   -Deloris Ping

## 2013-01-15 NOTE — Progress Notes (Signed)
Problem: Chemotherapy Treatment  Goal: *Chemotherapy regimen followed  Outcome: Progressing Towards Goal  Pt received Cycle 2 TC, tolerated well. Pre-medications adjusted by MD to help with side effect management. Medications reviewed with pt and pt verbalized understanding.

## 2013-01-15 NOTE — Progress Notes (Signed)
Per verbal order per Dr.Irvin new prescription for klonopin 0.5 mg tablets called into pt pharmacy.

## 2013-01-15 NOTE — Progress Notes (Signed)
[facility] - Chemotherapy Progress Note    0920  Pt admit to [facility] for Cycle 2 TC ambulatory in stable condition. Assessment completed. Pt very anxious about today's treatment, no acute physical concerns voiced at present. Continues with mild pain in left breast/axilla post surgery. Right chest port accessed with 20 gauge 0.75 inch huber needle with positive blood return. Labs drawn as ordered.    Blood pressure 127/71, pulse 63, temperature 98.1 ??F (36.7 ??C), resp. rate 18, height 5\' 6"  (1.676 m), weight 55.43 kg (122 lb 3.2 oz), SpO2 100.00%.    0955  Pt proceeded to appt with MD.    Jeanene Erb and spoke with Morrie Sheldon, LPN about pt's anxiety and to see if MD would like pt to get any Ativan IV prior to treatment today.    Recent Results (from the past 12 hour(s))   CBC WITH 3 PART DIFF    Collection Time     01/15/13  9:50 AM       Result Value Range    WBC 10.7  3.6 - 11.0 K/uL    RBC 3.64 (*) 3.80 - 5.20 M/uL    HGB 10.9 (*) 11.5 - 16.0 g/dL    HCT 16.1 (*) 09.6 - 47.0 %    MCV 90.7  80.0 - 99.0 FL    MCH 29.9  26.0 - 34.0 PG    MCHC 33.0  30.0 - 36.5 g/dL    RDW 04.5  40.9 - 81.1 %    PLATELET 425 (*) 150 - 400 K/uL    NEUTROPHILS 81 (*) 32 - 75 %    MIXED CELLS 9  3.2 - 16.9 %    LYMPHOCYTES 11 (*) 12 - 49 %    ABS. NEUTROPHILS 8.7 (*) 1.8 - 8.0 K/UL    ABS. MIXED CELLS 0.9  0.2 - 1.2 K/uL    ABS. LYMPHOCYTES 1.1  0.8 - 3.5 K/UL    DF AUTOMATED       Labs within treatment parameters. Updated chemotherapy orders received from MD.    Medications:  NS at 250 ml/hr  Ativan 2 mg IV  Aloxi 0.25 mg IV  Emend 150 mg IV  Decadron 20 mg IV  Docetaxel 120 mg IV  Cyclophosphamide 975 mg IV    Blood pressure 110/65, pulse 83, temperature 97.8 ??F (36.6 ??C), resp. rate 18, height 5\' 6"  (1.676 m), weight 55.43 kg (122 lb 3.2 oz), SpO2 97.00%.    1510  Pt tolerated treatment well. Positive blood return pre and post infusion. Port flushed and deaccessed per [employer]. D/c home ambulatory in no distress. Pt aware of  next appointment scheduled for 01/16/13 at 3 pm.

## 2013-01-15 NOTE — Progress Notes (Signed)
Emily [name] is a 61 y.o. female f/u for breast cancer.

## 2013-01-16 MED ADMIN — pegfilgrastim (NEULASTA) injection 6 mg: SUBCUTANEOUS | @ 20:00:00 | NDC 55513019001

## 2013-01-16 NOTE — Progress Notes (Signed)
[facility] Short Visit Note:    1555-1600:  Pt arrived ambulatory and in no distress, received Neulasa 6 mg subq in right arm and tolerated well. Discharge instructions given and patient verbalizes understanding.  D/Cd from [facility] ambulatory and in no distress.  Next visit 02/05/13 at 9 am.  Blood pressure 126/71, pulse 63, temperature 97.9 ??F (36.6 ??C), resp. rate 18, SpO2 100.00%.

## 2013-01-17 NOTE — Telephone Encounter (Signed)
Spoke with pt and informed to take an additional fioricet, and try to sleep as much as possilbe until headache resolves, per Dr. Elroy Channel. Pt voiced understanding. Informed pt that if anything else was needed, to call office. Pt voiced understanding.

## 2013-01-17 NOTE — Telephone Encounter (Signed)
PT called to report that she having a "debilitating headache" following her neulasta injection. She's had the headache since 3 a.m. She taken Clonazepam, fioricet, oxycodone with tylenol as prescribed. She had minimal relief for an hour. She has had plenty of fluids and food today.

## 2013-01-21 MED FILL — CYCLOPHOSPHAMIDE 500 MG IV SOLUTION: 500 mg | INTRAVENOUS | Qty: 25

## 2013-01-21 NOTE — Telephone Encounter (Signed)
Spoke with pt and she states that she is still having "debilitating headaches", and has not been able to do anything since Thursday. Pt states that she takes the Fioricet and waits an hour, if no relief, takes that oxycodone. She states that she has had no relief and has "done nothing but sleep, and tried to get some relief." Pt is wondering if this could be a rebound headache from the Fioricet and is wondering if she should try Imitrex. Pt also states that she is experiencing vaginal dryness and intense itching, but denies discharge. Pt is wondering if she has a yeast infection, and would like something called into pharmacy if Dr. Elroy Channel thinks that she does. Informed pt that nurse would speak to Dr. Elroy Channel and call back with Dr. Carney Harder suggestions. Pt voiced understanding.

## 2013-01-21 NOTE — Telephone Encounter (Signed)
Pt called.  She is still having headaches and wants to be certain she is following Dr Carney Harder instructions. She is also having vaginal dryness and itching. Please call [phone]

## 2013-01-21 NOTE — Telephone Encounter (Signed)
Please advise on what regimen pt could take for headaches and vaginal dryness.

## 2013-01-21 NOTE — Telephone Encounter (Signed)
Spoke with pt and informed, per Dr. Elroy Channel, to stop the Fioricet and double up on the oxycodone for headache relief. Also informed pt that she could try Replens for vaginal dryness or Diflucan could be called into pt's pharmacy. Pt states that she thinks that she has some at home that she would like to try before new prescription is prescribed. Also informed pt of appointment to see plastic surgeon, on April 28th at 2pm. Pt scheduled to see Dr. Edrick Oh. Pt voiced understanding.

## 2013-02-05 LAB — CBC WITH 3 PART DIFF
ABS. LYMPHOCYTES: 1.1 10*3/uL (ref 0.8–3.5)
ABS. MIXED CELLS: 0.8 10*3/uL (ref 0.2–1.2)
ABS. NEUTROPHILS: 4.7 10*3/uL (ref 1.8–8.0)
HCT: 32.2 % — ABNORMAL LOW (ref 35.0–47.0)
HGB: 10.2 g/dL — ABNORMAL LOW (ref 11.5–16.0)
LYMPHOCYTES: 16 % (ref 12–49)
MCH: 29.4 PG (ref 26.0–34.0)
MCHC: 31.7 g/dL (ref 30.0–36.5)
MCV: 92.8 FL (ref 80.0–99.0)
Mixed cells: 11 % (ref 3.2–16.9)
NEUTROPHILS: 72 % (ref 32–75)
PLATELET: 286 10*3/uL (ref 150–400)
RBC: 3.47 M/uL — ABNORMAL LOW (ref 3.80–5.20)
RDW: 16.8 % — ABNORMAL HIGH (ref 11.8–15.8)
WBC: 6.6 10*3/uL (ref 3.6–11.0)

## 2013-02-05 LAB — METABOLIC PANEL, COMPREHENSIVE
A-G Ratio: 1.6 (ref 1.1–2.2)
ALT (SGPT): 52 U/L (ref 12–78)
AST (SGOT): 22 U/L (ref 15–37)
Albumin: 3.8 g/dL (ref 3.5–5.0)
Alk. phosphatase: 136 U/L (ref 50–136)
Anion gap: 10 mmol/L (ref 5–15)
BUN/Creatinine ratio: 44 — ABNORMAL HIGH (ref 12–20)
BUN: 14 MG/DL (ref 6–20)
Bilirubin, total: 0.3 MG/DL (ref 0.2–1.0)
CO2: 29 mmol/L (ref 21–32)
Calcium: 8.8 MG/DL (ref 8.5–10.1)
Chloride: 105 mmol/L (ref 97–108)
Creatinine: 0.32 MG/DL — ABNORMAL LOW (ref 0.45–1.15)
GFR est AA: >60 mL/min/{1.73_m2} (ref >60)
GFR est non-AA: >60 mL/min/{1.73_m2} (ref >60)
Globulin: 2.4 g/dL (ref 2.0–4.0)
Glucose: 101 mg/dL — ABNORMAL HIGH (ref 65–100)
Potassium: 4.4 mmol/L (ref 3.5–5.1)
Protein, total: 6.2 g/dL — ABNORMAL LOW (ref 6.4–8.2)
Sodium: 144 mmol/L (ref 136–145)

## 2013-02-05 MED ADMIN — sodium chloride 0.9 % injection 10 mL: INTRAVENOUS | @ 14:00:00 | NDC 00409488810

## 2013-02-05 MED ADMIN — LORazepam (ATIVAN) injection 2 mg: INTRAVENOUS | @ 17:00:00 | NDC 17478004001

## 2013-02-05 MED ADMIN — palonosetron HCl (ALOXI) injection 0.25 mg: INTRAVENOUS | @ 15:00:00 | NDC 62856079701

## 2013-02-05 MED ADMIN — sodium chloride (NS) flush 10 mL: INTRAVENOUS | @ 18:00:00 | NDC 87701099893

## 2013-02-05 MED ADMIN — cyclophosphamide (CYTOXAN) 975 mg in 0.9% sodium chloride 250 mL, overfill volume 25 mL chemo infusion: INTRAVENOUS | @ 18:00:00 | NDC 10019095550

## 2013-02-05 MED ADMIN — sodium chloride (NS) flush 10 mL: INTRAVENOUS | @ 15:00:00 | NDC 87701099893

## 2013-02-05 MED ADMIN — dexamethasone (DECADRON) 4 mg/mL injection 20 mg: INTRAVENOUS | @ 15:00:00 | NDC 67457042200

## 2013-02-05 MED ADMIN — heparin (porcine) pf 500 Units: INTRAVENOUS | @ 18:00:00

## 2013-02-05 MED ADMIN — 0.9% sodium chloride infusion: INTRAVENOUS | @ 15:00:00 | NDC 00409798309

## 2013-02-05 MED ADMIN — fosaprepitant (EMEND) 150 mg in 0.9% sodium chloride 150 mL IVPB: INTRAVENOUS | @ 16:00:00 | NDC 00409798361

## 2013-02-05 MED ADMIN — DOCEtaxel (TAXOTERE) 120 mg in 0.9% sodium chloride 250 mL, overfill volume 25 mL chemo infusion: INTRAVENOUS | @ 17:00:00 | NDC 66758005001

## 2013-02-05 MED ADMIN — sodium chloride (NS) flush 10 mL: INTRAVENOUS | @ 14:00:00 | NDC 87701099893

## 2013-02-05 NOTE — Progress Notes (Signed)
[facility] Progress Note    Date: Feb 05, 2013    Name: Emily [name]    MRN: [id number]         DOB: [birth year]    Emily [name] Arrived ambulatory and in no distress for cycle 3 day 1 of TC regimen.  Assessment was completed, no acute issues at this time.  Patient continues to be very anxious, complaints of occasional difficulty swallowing, ringing in ears and dryness/itching to perineum.  Patient requesting to have CMP drawn.  Dr. Elroy Channel office called and spoke with Emily [name], informed of patient's complaints and request for labs.  New orders received for additional labs, other issues to be addressed during visit today.  Right chest port accessed without difficulty, labs drawn and in process.    0945:  Proceeded to appt with Dr.  Elroy Channel.    Emily [name]'s vitals were reviewed.  Visit Vitals   Item Reading   ??? BP 110/63   ??? Pulse 76   ??? Temp 97.8 ??F (36.6 ??C)   ??? Resp 18   ??? Ht 5\' 6"  (1.676 m)   ??? Wt 56.246 kg (124 lb)   ??? BMI 20.02 kg/m2   ??? SpO2 97%       Lab results were obtained and reviewed, within treatment parameters.  Recent Results (from the past 12 hour(s))   CBC WITH 3 PART DIFF    Collection Time     02/05/13  9:44 AM       Result Value Range    WBC 6.6  3.6 - 11.0 K/uL    RBC 3.47 (*) 3.80 - 5.20 M/uL    HGB 10.2 (*) 11.5 - 16.0 g/dL    HCT 40.9 (*) 81.1 - 47.0 %    MCV 92.8  80.0 - 99.0 FL    MCH 29.4  26.0 - 34.0 PG    MCHC 31.7  30.0 - 36.5 g/dL    RDW 91.4 (*) 78.2 - 15.8 %    PLATELET 286  150 - 400 K/uL    NEUTROPHILS 72  32 - 75 %    MIXED CELLS 11  3.2 - 16.9 %    LYMPHOCYTES 16  12 - 49 %    ABS. NEUTROPHILS 4.7  1.8 - 8.0 K/UL    ABS. MIXED CELLS 0.8  0.2 - 1.2 K/uL    ABS. LYMPHOCYTES 1.1  0.8 - 3.5 K/UL    DF AUTOMATED     METABOLIC PANEL, COMPREHENSIVE    Collection Time     02/05/13  9:44 AM       Result Value Range    Sodium 144  136 - 145 mmol/L    Potassium 4.4  3.5 - 5.1 mmol/L    Chloride 105  97 - 108 mmol/L    CO2 29  21 - 32 mmol/L    Anion gap 10  5 - 15 mmol/L    Glucose 101 (*) 65 - 100 mg/dL     BUN 14  6 - 20 MG/DL    Creatinine 9.56 (*) 0.45 - 1.15 MG/DL    BUN/Creatinine ratio 44 (*) 12 - 20      GFR est AA >60  >60 ml/min/1.23m2    GFR est non-AA >60  >60 ml/min/1.31m2    Calcium 8.8  8.5 - 10.1 MG/DL    Bilirubin, total 0.3  0.2 - 1.0 MG/DL    ALT 52  12 - 78 U/L    AST 22  15 -  37 U/L    Alk. phosphatase 136  50 - 136 U/L    Protein, total 6.2 (*) 6.4 - 8.2 g/dL    Albumin 3.8  3.5 - 5.0 g/dL    Globulin 2.4  2.0 - 4.0 g/dL    A-G Ratio 1.6  1.1 - 2.2       1115:  Patient returns from MD office, continuing with treatment.  Pre-medications  were administered as ordered and chemotherapy was infused without difficulty.  Patient monitored per protocol, no adverse reactions observed, blood return noted pre and post infusions.  Port flushed per protocol and de accessed.     Medications:   NS at 250 ml/hr   Ativan 2 mg IV   Aloxi 0.25 mg IV   Emend 150 mg IV   Decadron 20 mg IV   Docetaxel 120 mg IV   Cyclophosphamide 975 mg IV  Patient Vitals for the past 12 hrs:   Temp Pulse Resp BP SpO2   02/05/13 1422 97.8 ??F (36.6 ??C) 76 18 110/63 mmHg 97 %   02/05/13 0917 97.8 ??F (36.6 ??C) 74 18 108/56 mmHg 100 %     Emily [name] tolerated treatment well and [facility] condition at 1430.  Discharge instructions given and patient verbalizes understanding. She is to return on May 7 at 3 pm for her next appointment.    Emily Hews, RN  Feb 05, 2013

## 2013-02-05 NOTE — Progress Notes (Signed)
[facility]  [address], [address]   [place], [place]   [address]  W: [phone]   F: [phone]      f/u HEME/ONC CONSULT      Reason for visit: evaluation for treatment for  Breast Cancer    Consulting physician:  Dr. Dan Humphreys    HPI:   Emily [name] is a 61 y.o.  female who I was asked to see in consultation at the request of Dr. Maxwell Marion for evaluation for systemic therapy for breast cancer.      She had a screening ultrasound on 09/24/12 showing a 2.6 cm left LN.  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  HER 2 negative IHC at 0.    She has a history of a pheochromocytoma surgically removed in 1997.    She then had a MRI breast on 10/05/12 showing the 2.8 x 2 cm LN as well as a left breast LIQ 0.9 cm x 1.1 cm x 0.6 cm mass.  Biopsy of that mass by FNA on 10/16/12 shows a poorly differentiated carcinoma.  These slides were air-dried, HER 2 not able to be performed on them.    Mammaprint of LN shows high risk luminal, ER + at 0.19, PR + at 0.61, HER 2 negative at -0.69.    She underwent L lumpectomy on 11/28/12 showing IDC with neuroendocrine features, gr 2, o.5 cm, DCIS gr 2-3, + LVI, 1/14 LN + with a 1 cm met, no ECE.  HER 2 negative (ratio 1.2, sig/cell 2.8), ER + 95%, PR + 5%; ki67 5%; DCIS ER + 95%, PR + 1%.    Interval history: s/p TC #2. She reports this cycle was easier.  Only complaint was bad headaches and bone pain.  She reports overall it was much easier. She does report some continued left axilla pain and states she felt a mass there over the weekend which has gotten smaller since.   Gr 2 loss of appetite, gr 1 constipation, gr 2 fatigue, nausea much better with Sancuso patch,  gr 1 hot flashes, gr 2  anxiety, 4/10 left breast pain, gr 1 mucositis, gr 1 SOB, gr 2 headache (resolved).  No flushing with steroids last night.    She has lymphedema sleeve and wears 4 hours per day.  Is working on getting "lymphedema machine".       DX   Encounter Diagnoses   Name Primary?   ??? Breast cancer, stage 2 Yes   ??? Pain    ??? Yeast infection    ??? H/O pheochromocytoma       Past Medical History   Diagnosis Date   ??? Cancer      left breast  cancer   ??? Anxiety    ??? Arthritis    ??? Nausea & vomiting    ??? Concussion 2011   ??? Chronic tension headaches    ??? Headache      OCCAS   ??? Other ill-defined conditions      PHEOCHROMOCYTOMA     Past Surgical History   Procedure Laterality Date   ??? Pr breast surgery procedure unlisted  '91     breast implants   ??? Hx heent       T&A   ??? Hx  heent       right adrenal removed   ??? Pr abdomen surgery proc unlisted       hernia mesh repair   ??? Hx appendectomy     ??? Hx gyn       c-sections   ??? Hx oophorectomy       right   ??? Hx orthopaedic       bilateral shoulder arthroscopy   ??? Hx modified radical mastectomy  11/28/2012     LEFT BREAST MODIFIED RADICAL MASTECTOMY W/ RECONSTRUCTION LEFT BREAST WITH TISSUE EXPANDER AND ALLODERM;PORTACATH INSERTION performed by Binnie Rail, MD at [facility] AM[facility]UL[facility]   ??? Hx breast reconstruction  11/28/2012     BREAST RECONSTRUCTION /C  INSERTION EXPANDER & ALLODERM performed by Ivar Bury, MD at [facility] AMBUL[facility]ORY OR     History     Social History   ??? Marital Status: SINGLE     Spouse Name: N/A     Number of Children: N/A   ??? Years of Education: N/A     Social History Main Topics   ??? Smoking status: Never Smoker    ??? Smokeless tobacco: Never Used   ??? Alcohol Use: No   ??? Drug Use: No   ??? Sexually Active: Not on file     Other Topics Concern   ??? Not on file     Social History Narrative   ??? No narrative on file     Family History   Problem Relation Age of Onset   ??? Alzheimer Mother    ??? Diabetes Mother    ??? Other Mother      COMBATIVE POST ANESTHESIA   ??? Heart Disease  Father    ??? Diabetes Father    ??? Lung Disease Father      COPD   ??? Headache Sister    ??? Migraines Sister        Current Outpatient Prescriptions   Medication Sig Dispense Refill   ??? oxyCODONE-acetaminophen (PERCOCET) 5-325 mg per tablet Take 1 Tab by mouth every four (4) hours as needed for Pain.  50 Tab  0   ??? fluconazole (DIFLUCAN) 150 mg tablet Take 1 Tab by mouth daily for 1 day.  1 Tab  1   ??? OTHER cranial prosthesis  1 Each  0   ??? clonazePAM (KLONOPIN) 0.5 mg tablet Take 1 Tab by mouth two (2) times a day.  60 Tab  2   ??? Granisetron (SANCUSO) 3.1 mg/24 hour ptwk Apply one patch to upper arm 24 hours prior to chemotherapy and leave in place for 7 days.  1 Patch  2   ??? citalopram (CELEXA) 20 mg tablet Take 1 Tab by mouth daily.  30 Tab  4   ??? DOCUSATE SODIUM (COLACE PO) Take  by mouth.       ??? dexamethasone (DECADRON) 4 mg tablet Take 2 Tabs by mouth daily. 8 mg twice a day the day before and day after chemo  32 Tab  3   ??? LORazepam (ATIVAN) 1 mg tablet Take 1 Tab by mouth every eight (8) hours as needed for Anxiety.  90 Tab  3   ??? valACYclovir (VALTREX) 500 mg tablet Take 1 tab by mouth two (2) times a day for 3 days then 500mg  once daily.  30 Tab  0   ??? HYDROcodone-acetaminophen (NORCO) 5-325 mg per tablet Take 1 or 2 tabs every 6 hours as needed for pain  30  Tab  0   ??? codeine-butalbital-acetaminophen-caffeine (FIORICET WITH CODEINE) 50-325-40-30 mg per capsule Take 1 Cap by mouth every four (4) hours as needed for Headache.  30 Cap  1   ??? magic mouthwash solution Magic mouth wash   Maalox  Lidocaine 2% viscous   Diphenhydramine oral solution   Pharmacy to mix equal portions of ingredients to a total volume as indicated in the dispense amount.  Swish and spit 15-61ml Q 4 hours as needed for mouth pain. May swallow for throat pain.  500 mL  2   ??? promethazine (PHENERGAN) 12.5 mg tablet Take 1 Tab by mouth every six (6) hours as needed for Nausea.  10 Tab  1   ??? polyethylene glycol (MIRALAX) 17 gram/dose powder  Take 17 g by mouth daily.       ??? lidocaine-prilocaine (EMLA) topical cream Apply  to affected area as needed for Pain.  30 g  0   ??? ondansetron hcl (ZOFRAN) 8 mg tablet Take 1 Tab by mouth every eight (8) hours as needed for Nausea.  60 Tab  3   ??? prochlorperazine (COMPAZINE) 10 mg tablet Take 1 Tab by mouth every six (6) hours as needed for Nausea.  50 Tab  5   ??? HYDROmorphone (DILAUDID) 2 mg tablet Take 2 Tabs by mouth every three (3) hours as needed (every 3-4 hours).  70 Tab  0     Facility-Administered Medications Ordered in Other Visits   Medication Dose Route Frequency Provider Last Rate Last Dose   ??? sodium chloride (NS) flush 10 mL  10 mL IntraVENous PRN Carmina Miller, MD   10 mL at 02/05/13 1424   ??? heparin (porcine) pf 500 Units  500 Units IntraVENous PRN Carmina Miller, MD   500 Units at 02/05/13 1424   ??? sodium chloride 0.9 % injection 10 mL  10 mL IntraVENous PRN Carmina Miller, MD   10 mL at 02/05/13 0952   ??? [START ON 02/06/2013] pegfilgrastim (NEULASTA) injection 6 mg  6 mg SubCUTAneous ONCE Leotis Shames, MD       ??? 0.9% sodium chloride infusion  250 mL/hr IntraVENous CONTINUOUS Leotis Shames, MD 250 mL/hr at 02/05/13 1122 250 mL/hr at 02/05/13 1122   ??? [COMPLETED] palonosetron HCl (ALOXI) injection 0.25 mg  0.25 mg IntraVENous ONCE Leotis Shames, MD   0.25 mg at 02/05/13 1124   ??? [COMPLETED] fosaprepitant (EMEND) 150 mg in 0.9% sodium chloride 150 mL IVPB  150 mg IntraVENous ONCE Leotis Shames, MD   150 mg at 02/05/13 1135   ??? [COMPLETED] LORazepam (ATIVAN) injection 2 mg  2 mg IntraVENous ONCE Leotis Shames, MD   2 mg at 02/05/13 1230   ??? [COMPLETED] dexamethasone (DECADRON) 4 mg/mL injection 20 mg  20 mg IntraVENous ONCE Leotis Shames, MD   20 mg at 02/05/13 1125   ??? [COMPLETED] DOCEtaxel (TAXOTERE) 120 mg in 0.9% sodium chloride 250 mL, overfill volume 25 mL chemo infusion  120 mg IntraVENous ONCE Leotis Shames, MD   120 mg at 02/05/13 1240   ??? [COMPLETED]  cyclophosphamide (CYTOXAN) 975 mg in 0.9% sodium chloride 250 mL, overfill volume 25 mL chemo infusion  975 mg IntraVENous ONCE Leotis Shames, MD   975 mg at 02/05/13 1342       Allergies   Allergen Reactions   ??? Aspirin Unknown (comments)     Gi bleeding   ??? Compazine (Prochlorperazine) Rash   ???  Keflex (Cephalexin) Rash   ??? Nsaids (Non-Steroidal Anti-Inflammatory Drug) Swelling     Throat swells   ??? Prednisone Rash       Review of Systems    A comprehensive review of systems was performed and all systems were negative except for HPI and for the symptom report form, reviewed and scanned in.      Objective:  Physical Exam:  BP 123/65   Pulse 68   Temp(Src) 97.2 ??F (36.2 ??C) (Oral)   Resp 18   Ht 5\' 6"  (1.676 m)   Wt 123 lb 3.2 oz (55.883 kg)   BMI 19.89 kg/m2   SpO2 98%    General:  Alert, cooperative, no distress, appears stated age.   Head:  Normocephalic, without obvious abnormality, atraumatic.   Eyes:  Conjunctivae/corneas clear. PERRL, EOMs intact.   Throat: Lips, mucosa, and tongue normal.    Neck: Supple, symmetrical, trachea midline, no adenopathy, thyroid: no enlargement/tenderness/nodules   Back:   Symmetric, no curvature. ROM normal. No CVA tenderness.   Lungs:   Clear to auscultation bilaterally.   Chest wall:  No tenderness or deformity.   Heart:  Regular rate and rhythm, S1, S2 normal, no murmur, click, rub or gallop.   Abdomen:   Soft, non-tender. Bowel sounds normal. No masses,  No organomegaly.   Breast: No palpable seroma to left axilla.    Extremities: Extremities normal, atraumatic, no cyanosis or edema.   Skin: Skin color, texture, turgor normal. No rashes or lesions.   Lymph nodes: Cervical, supraclavicular, and axillary nodes normal.   Neurologic: CNII-XII intact.     Diagnostic Imaging   Results for orders placed during the hospital encounter of 11/28/12   XR FLUOROSCOPY UNDER 60 MINUTES    Narrative **Final Report**       ICD Codes / Adm.Diagnosis: 174.9   / Malignant neoplasm of breast (     Examination:  CR FLUORO GUIDE UP TO 1 HR  - [id number] - Nov 28 2012  9:15AM  Accession No:  [id number]  Reason:  port a cath      REPORT:  Portable fluoroscopy was utilized in the Operating Room.        IMPRESSION:  PORTABLE FLUOROSCOPY.      Fluoro time:  0.066 minutes      vk            Signing/Reading Doctor: BSR ADMINISTRATOR [id number])    Approved: BSR ADMINISTRATOR [id number])  Nov 30 2012  4:18PM                                  Results for orders placed in visit on 10/22/12   PET BREAST CANCER DIAGNOSIS   10/12/12 PET:  Negative for distant mets  10/22/12 brain MRI:  negative    Lab Results  Lab Results   Component Value Date/Time    WBC 6.6 02/05/2013  9:44 AM    HGB 10.2 02/05/2013  9:44 AM    HCT 32.2 02/05/2013  9:44 AM    PLATELET 286 02/05/2013  9:44 AM    MCV 92.8 02/05/2013  9:44 AM       Lab Results   Component Value Date/Time    Sodium 144 02/05/2013  9:44 AM    Potassium 4.4 02/05/2013  9:44 AM    Chloride 105 02/05/2013  9:44 AM    CO2 29 02/05/2013  9:44 AM    Anion  gap 10 02/05/2013  9:44 AM    Glucose 101 02/05/2013  9:44 AM    BUN 14 02/05/2013  9:44 AM    Creatinine 0.32 02/05/2013  9:44 AM    BUN/Creatinine ratio 44 02/05/2013  9:44 AM    GFR est AA >60 02/05/2013  9:44 AM    GFR est non-AA >60 02/05/2013  9:44 AM    Calcium 8.8 02/05/2013  9:44 AM    AST 22 02/05/2013  9:44 AM    Alk. phosphatase 136 02/05/2013  9:44 AM    Protein, total 6.2 02/05/2013  9:44 AM    Albumin 3.8 02/05/2013  9:44 AM    Globulin 2.4 02/05/2013  9:44 AM    A-G Ratio 1.6 02/05/2013  9:44 AM    ALT 52 02/05/2013  9:44 AM     Assessment/Plan:  62 y.o. female with a 0.5 cm left breast cancer, IDC with endocrine features, 1/14 LN +, gr 2, high risk mammaprint,  ER and PR +, HER 2 negative    PS 0    1. Breast cancer with neuroendocrine features, stage IIA, [id number]    With no further therapy, her RR is about 36%; endocrine therapy will decrease her RR to 19%; and 2nd gen chemo will decrease her RR to 12%; 3rd gen to 10%.  These #s may be higher with her high risk  mammaprint.     She is agreeable to TC x 4, started 12/25/12. Goal is cure.  #3 today.    The patient was given presciptions for a wig, emla cream, decadron to take 8 mg bid the day before and day after chemo.  Neulasta in opic the following day.    In the future, based on her pheo and now breast cancer history, I recommend genetic counseling -- referral to [facility] today    2. Anxiety/Depression: continue celexa 20 mg daily.  She states this has improved her depression.  Continue ativan 1 mg in the evening and 0.5 mg qhs.  continue clonazepam 0.5 mg bid.    3. Headache:  Percocet; warned about constipation, pr, improved.  Fioricet stopped due to worsening headache.     4. Axilla pain:  Continue percocet; no palpable seroma today. has appt with Dr. Imogene Burn tomorrow.    5. Nausea:  sancuso patch; emend and aloxi; phenergan prn.    6. Yeast Infection: Likely related to chemo. Diflucan 150mg .    7. Constipation: colace and senna prn.    > 25 min were spent with this patient with > 50% of that time spent in face to face counseling on the above.      Follow-up Disposition:  Return in 3 weeks (on 02/26/2013).    Dortha Kern, MD

## 2013-02-05 NOTE — Patient Instructions (Signed)
Return to see us in 3 weeks.

## 2013-02-05 NOTE — Progress Notes (Signed)
Emily [name] is a 62 y.o. female f/u for breast cancer.

## 2013-02-05 NOTE — Progress Notes (Signed)
Emily [name] is a 62 y.o. female who is being seen for a follow up for breast cancer. Patient has an appointment scheduled with Dr.Chin tomorrow 02-06-13

## 2013-02-05 NOTE — Progress Notes (Signed)
Problem: Chemotherapy Treatment  Goal: *Chemotherapy regimen followed  Outcome: Progressing Towards Goal  Patient received cycle 3 of regimen and tolerated well.  Goal: *Hemodynamically stable  Outcome: Progressing Towards Goal  Vital signs, o2 sats, weight and labs monitored.

## 2013-02-06 MED ADMIN — pegfilgrastim (NEULASTA) injection 6 mg: SUBCUTANEOUS | @ 19:00:00 | NDC 55513019001

## 2013-02-06 NOTE — Progress Notes (Signed)
[facility] SHORT VISIT CONSULT NOTE    1505 Patient arrived at [facility] ambulatory and in no distress for Neulasta. Assessment completed, no new complaints voiced.    Blood pressure 115/65, pulse 82, temperature 98 ??F (36.7 ??C), resp. rate 16, SpO2 97.00%.      Medications Received:  Neulasta R arm       1525 Tolerated treatment well, no adverse reaction noted. D/C'd from [facility] ambulatory and in no distress accompanied by husband. Next appt 02/26/13 0900

## 2013-02-11 MED FILL — CYCLOPHOSPHAMIDE 500 MG IV SOLUTION: 500 mg | INTRAVENOUS | Qty: 25

## 2013-02-12 NOTE — Telephone Encounter (Signed)
Medical records faxed.

## 2013-02-12 NOTE — Telephone Encounter (Signed)
Vernona Rieger from [facility] called and stated they have not received requested records for [facility]. Per Vernona Rieger, pt has a consultation with them tomorrow and their doctor would like to review records.     Please fax to [phone]. Their phone number is [phone].    Thanks.

## 2013-02-13 ENCOUNTER — Encounter

## 2013-02-13 ENCOUNTER — Telehealth

## 2013-02-13 NOTE — Telephone Encounter (Signed)
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 Pt voiced understanding and pyridium was called in to pharmacy until results were faxed to office.

## 2013-02-13 NOTE — Telephone Encounter (Signed)
Spoke with pt and informed that Levaquin has been called into pharmacy and instructed pt how to take medication. Pt voiced understanding. Informed pt that if symptoms worsen or fail to improve, to call office. Pt voiced understanding.

## 2013-02-13 NOTE — Telephone Encounter (Signed)
Spoke with pt and informed that report has not been faxed to office. Pt states that she will refax report.

## 2013-02-13 NOTE — Telephone Encounter (Signed)
Pt called.  She believes she has a UTI. It is very painful and she is unable to urinate. Please call [phone].

## 2013-02-13 NOTE — Progress Notes (Signed)
Urinalysis from Gyn office shows 1+ bacteria, 10-15 WBC.  Levaquin 250mg  daily x 3 days prescribed today.

## 2013-02-13 NOTE — Telephone Encounter (Signed)
Pt called. Her lab results have been faxed. Please call in a prescription to [facility] at [facility]. She can be reached at [phone]

## 2013-02-13 NOTE — Telephone Encounter (Signed)
Please return pt call asap. Her PCP is here in [place] as well and would like to speak with our office regarding potential UTI.

## 2013-02-14 NOTE — Telephone Encounter (Signed)
Pt called to advise that she went to the ER last night. Please call [phone]

## 2013-02-14 NOTE — Telephone Encounter (Signed)
Spoke with pt and she states that she went to ED at [facility] for evaluation of hives. Pt states that she has temperature of 99.7, cough, and not feeling well. Pt c/o of SOB but is able to carry on conversation. Pt states that at the ED she received fluids, prednisone, and benadryl IV. She states that they wanted to admit her but she was "too scared to stay there". Pt states that she was sent home with prednisone 60mg , and benadryl 50mg . Spoke with Dr. Elroy Channel and informed, he states to call pt in the morning to check on. Informed pt. Also informed pt to call office if symptoms worsen or fail to improve. Pt voiced understanding.

## 2013-02-15 NOTE — Telephone Encounter (Signed)
Called and left voicemail to see how pt is feeling today. Waiting on return call.

## 2013-02-15 NOTE — Progress Notes (Signed)
Called and spoke with [facility] and was informed that pt has an appointment to be seen on 6/13 at 12:30pm at [facility] by Dr. Alben Deeds.

## 2013-02-18 NOTE — Telephone Encounter (Signed)
Pt called. She has hives.  Please call [phone]

## 2013-02-18 NOTE — Telephone Encounter (Signed)
Pt to come and be seen by Dr. Elroy Channel on 02/19/13 at 8am.

## 2013-02-18 NOTE — Telephone Encounter (Signed)
Spoke with pt and she states that she has hives that are painful and itchy. Pt states that she has finished prednisone and benadryl regimen that was prescribed. Pt scheduled to see Dr. Elroy Channel at 8am on 5/20. Pt voiced understanding.

## 2013-02-19 NOTE — Progress Notes (Signed)
Emily [name] is a 62 y.o. female f/u for hives.

## 2013-02-19 NOTE — Progress Notes (Signed)
[facility]  [address], [address]   [place], [place]   [address]  W: [phone]   F: [phone]      f/u HEME/ONC CONSULT      Reason for visit: evaluation for treatment for  Hives    Consulting physician:  Dr. Dan Humphreys, Dr. Fernanda Drum, Montez Hageman.  Ref physician:  Dr. Maxwell Marion    HPI:   Emily [name] is a 62 y.o.  female who I am seeing for adjuvant therapy for breast cancer.        She had a screening ultrasound on 09/24/12 showing a 2.6 cm left LN.  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  HER 2 negative IHC at 0.    She has a history of a pheochromocytoma surgically removed in 1997.    She then had a MRI breast on 10/05/12 showing the 2.8 x 2 cm LN as well as a left breast LIQ 0.9 cm x 1.1 cm x 0.6 cm mass.  Biopsy of that mass by FNA on 10/16/12 shows a poorly differentiated carcinoma.  These slides were air-dried, HER 2 not able to be performed on them.    Mammaprint of LN shows high risk luminal, ER + at 0.19, PR + at 0.61, HER 2 negative at -0.69.    She underwent L lumpectomy on 11/28/12 showing IDC with neuroendocrine features, gr 2, o.5 cm, DCIS gr 2-3, + LVI, 1/14 LN + with a 1 cm met, no ECE.  HER 2 negative (ratio 1.2, sig/cell 2.8), ER + 95%, PR + 5%; ki67 5%; DCIS ER + 95%, PR + 1%.    Interval history: s/p TC #3 on 5/6.  Was doing well compared to previous cycles, especially in terms of nausea and other symptoms, but developed dysuira on 5/14.  U/a from that day (records reviewed) shoes 10-15 wbc and 1+ bacteria, she was given levaquin 250 mg x 3 days.  Also received diflucan x 1 from another provider.  States she broke out in itchy and painful hives on her buttocks, both arms and upper legs that evening and  it worsened over the next 2 days.  Went to [facility] ER on 5/15 and was given benadryl and prednisone, but continued to take the levaquin.  States that she does not feel that the prednisone helped but states the rash is improved and benadryl is helping.    Gr 2 loss of appetite, gr 1 hot flashes, 5/10 pain, gr 3 cough, gr 3 itching and rash, gr 2 headache.    States she is going to see Dr. Maxwell Marion this week for a seroma.    DX   Encounter Diagnosis   Name Primary?   ??? Breast cancer, stage 2 Yes      Past Medical History   Diagnosis Date   ??? Cancer      left breast  cancer   ??? Anxiety    ??? Arthritis    ??? Nausea & vomiting    ??? Concussion 2011   ??? Chronic tension headaches    ??? Headache      OCCAS   ??? Other ill-defined conditions      PHEOCHROMOCYTOMA     Past Surgical History   Procedure Laterality Date   ???  Pr breast surgery procedure unlisted  '91     breast implants   ??? Hx heent       T&A   ??? Hx heent       right adrenal removed   ??? Pr abdomen surgery proc unlisted       hernia mesh repair   ??? Hx appendectomy     ??? Hx gyn       c-sections   ??? Hx oophorectomy       right   ??? Hx orthopaedic       bilateral shoulder arthroscopy   ??? Hx modified radical mastectomy  11/28/2012     LEFT BREAST MODIFIED RADICAL MASTECTOMY W/ RECONSTRUCTION LEFT BREAST WITH TISSUE EXPANDER AND ALLODERM;PORTACATH INSERTION performed by Binnie Rail, MD at [facility] AMBULATORY OR   ??? Hx breast reconstruction  11/28/2012     BREAST RECONSTRUCTION /C  INSERTION EXPANDER & ALLODERM performed by Ivar Bury, MD at [facility] AMBULATORY OR     History     Social History   ??? Marital Status: SINGLE     Spouse Name: N/A     Number of Children: N/A   ??? Years of Education: N/A     Social History Main Topics   ??? Smoking status: Never Smoker    ??? Smokeless tobacco: Never Used   ??? Alcohol Use: No   ??? Drug Use: No   ??? Sexually Active: Not on file     Other Topics Concern   ??? Not on file     Social History Narrative   ??? No narrative on file     Family  History   Problem Relation Age of Onset   ??? Alzheimer Mother    ??? Diabetes Mother    ??? Other Mother      COMBATIVE POST ANESTHESIA   ??? Heart Disease Father    ??? Diabetes Father    ??? Lung Disease Father      COPD   ??? Headache Sister    ??? Migraines Sister        Current Outpatient Prescriptions   Medication Sig Dispense Refill   ??? DIPHENHYDRAMINE HCL (BENADRYL ALLERGY PO) Take  by mouth.       ??? hydrOXYzine (ATARAX) 50 mg tablet Take 1 Tab by mouth every six (6) hours as needed for Itching for 10 days.  30 Tab  0   ??? oxyCODONE-acetaminophen (PERCOCET) 5-325 mg per tablet Take 1 Tab by mouth every four (4) hours as needed for Pain.  50 Tab  0   ??? OTHER cranial prosthesis  1 Each  0   ??? LORazepam (ATIVAN) 1 mg tablet Take 1 Tab by mouth every eight (8) hours as needed for Anxiety.  90 Tab  3   ??? clonazePAM (KLONOPIN) 0.5 mg tablet Take 1 Tab by mouth two (2) times a day.  60 Tab  2   ??? valACYclovir (VALTREX) 500 mg tablet Take 1 tab by mouth two (2) times a day for 3 days then 500mg  once daily.  30 Tab  0   ??? HYDROcodone-acetaminophen (NORCO) 5-325 mg per tablet Take 1 or 2 tabs every 6 hours as needed for pain  30 Tab  0   ??? Granisetron (SANCUSO) 3.1 mg/24 hour ptwk Apply one patch to upper arm 24 hours prior to chemotherapy and leave in place for 7 days.  1 Patch  2   ??? codeine-butalbital-acetaminophen-caffeine (FIORICET WITH CODEINE) 50-325-40-30 mg per  capsule Take 1 Cap by mouth every four (4) hours as needed for Headache.  30 Cap  1   ??? magic mouthwash solution Magic mouth wash   Maalox  Lidocaine 2% viscous   Diphenhydramine oral solution   Pharmacy to mix equal portions of ingredients to a total volume as indicated in the dispense amount.  Swish and spit 15-20ml Q 4 hours as needed for mouth pain. May swallow for throat pain.  500 mL  2   ??? citalopram (CELEXA) 20 mg tablet Take 1 Tab by mouth daily.  30 Tab  4   ??? promethazine (PHENERGAN) 12.5 mg tablet Take 1 Tab by mouth every six (6) hours as needed for  Nausea.  10 Tab  1   ??? DOCUSATE SODIUM (COLACE PO) Take  by mouth.       ??? polyethylene glycol (MIRALAX) 17 gram/dose powder Take 17 g by mouth daily.       ??? dexamethasone (DECADRON) 4 mg tablet Take 2 Tabs by mouth daily. 8 mg twice a day the day before and day after chemo  32 Tab  3   ??? lidocaine-prilocaine (EMLA) topical cream Apply  to affected area as needed for Pain.  30 g  0   ??? ondansetron hcl (ZOFRAN) 8 mg tablet Take 1 Tab by mouth every eight (8) hours as needed for Nausea.  60 Tab  3   ??? prochlorperazine (COMPAZINE) 10 mg tablet Take 1 Tab by mouth every six (6) hours as needed for Nausea.  50 Tab  5   ??? HYDROmorphone (DILAUDID) 2 mg tablet Take 2 Tabs by mouth every three (3) hours as needed (every 3-4 hours).  70 Tab  0       Allergies   Allergen Reactions   ??? Aspirin Unknown (comments)     Gi bleeding   ??? Compazine (Prochlorperazine) Rash   ??? Keflex (Cephalexin) Rash   ??? Levaquin (Levofloxacin) Hives   ??? Nsaids (Non-Steroidal Anti-Inflammatory Drug) Swelling     Throat swells   ??? Prednisone Rash       Review of Systems    A comprehensive review of systems was performed and all systems were negative except for HPI and for the symptom report form, reviewed and scanned in.      Objective:  Physical Exam:  BP 120/55   Pulse 73   Temp(Src) 96.9 ??F (36.1 ??C) (Oral)   Resp 15   Ht 5\' 6"  (1.676 m)   Wt 124 lb (56.246 kg)   BMI 20.02 kg/m2   SpO2 99%    General:  Alert, cooperative, no distress, appears stated age.   Head:  Normocephalic, without obvious abnormality, atraumatic.   Eyes:  Conjunctivae/corneas clear. PERRL, EOMs intact.   Throat: Lips, mucosa, and tongue normal.    Neck: Supple, symmetrical, trachea midline, no adenopathy, thyroid: no enlargement/tenderness/nodules   Back:   Symmetric, no curvature. ROM normal. No CVA tenderness.   Lungs:   Clear to auscultation bilaterally.   Chest wall:  No tenderness or deformity.   Heart:  Regular rate and rhythm, S1, S2 normal, no murmur, click, rub or  gallop.   Abdomen:   Soft, non-tender. Bowel sounds normal. No masses,  No organomegaly.   Breast: No palpable seroma to left axilla.    Extremities: Extremities normal, atraumatic, no cyanosis or edema.   Skin: Hives on bilateral arms and upper legs   Lymph nodes: Cervical, supraclavicular, and axillary nodes normal.   Neurologic:  CNII-XII intact.       Diagnostic Imaging   Results for orders placed during the hospital encounter of 11/28/12   XR FLUOROSCOPY UNDER 60 MINUTES    Narrative **Final Report**       ICD Codes / Adm.Diagnosis: 174.9   / Malignant neoplasm of breast (    Examination:  CR FLUORO GUIDE UP TO 1 HR  - [id number] - Nov 28 2012  9:15AM  Accession No:  [id number]  Reason:  port a cath      REPORT:  Portable fluoroscopy was utilized in the Operating Room.        IMPRESSION:  PORTABLE FLUOROSCOPY.      Fluoro time:  0.066 minutes      vk            Signing/Reading Doctor: BSR ADMINISTRATOR [phone])    Approved: BSR ADMINISTRATOR [phone])  Nov 30 2012  4:18PM                                  Results for orders placed in visit on 10/22/12   PET BREAST CANCER DIAGNOSIS   10/12/12 PET:  Negative for distant mets  10/22/12 brain MRI:  negative    Lab Results  Lab Results   Component Value Date/Time    WBC 6.6 02/05/2013  9:44 AM    HGB 10.2 02/05/2013  9:44 AM    HCT 32.2 02/05/2013  9:44 AM    PLATELET 286 02/05/2013  9:44 AM    MCV 92.8 02/05/2013  9:44 AM       Lab Results   Component Value Date/Time    Sodium 144 02/05/2013  9:44 AM    Potassium 4.4 02/05/2013  9:44 AM    Chloride 105 02/05/2013  9:44 AM    CO2 29 02/05/2013  9:44 AM    Anion gap 10 02/05/2013  9:44 AM    Glucose 101 02/05/2013  9:44 AM    BUN 14 02/05/2013  9:44 AM    Creatinine 0.32 02/05/2013  9:44 AM    BUN/Creatinine ratio 44 02/05/2013  9:44 AM    GFR est AA >60 02/05/2013  9:44 AM    GFR est non-AA >60 02/05/2013  9:44 AM    Calcium 8.8 02/05/2013  9:44 AM    AST 22 02/05/2013  9:44 AM    Alk. phosphatase 136 02/05/2013  9:44 AM    Protein, total 6.2 02/05/2013  9:44 AM     Albumin 3.8 02/05/2013  9:44 AM    Globulin 2.4 02/05/2013  9:44 AM    A-G Ratio 1.6 02/05/2013  9:44 AM    ALT 52 02/05/2013  9:44 AM     Assessment/Plan:  62 y.o. female with a 0.5 cm left breast cancer, IDC with endocrine features, 1/14 LN +, gr 2, high risk mammaprint,  ER and PR +, HER 2 negative. ER records reviewed and summarized above.  Labs reviewed from records, and u/a put in HPI.    PS 0    1. Breast cancer with neuroendocrine features, stage IIA, [id number]    With no further therapy, her RR is about 36%; endocrine therapy will decrease her RR to 19%; and 2nd gen chemo will decrease her RR to 12%; 3rd gen to 10%.  These #s may be higher with her high risk mammaprint.     She is agreeable to TC x 4, started 12/25/12. Goal is  cure.  S/p #3.    The patient was given presciptions for a wig, emla cream, decadron to take 8 mg bid the day before and day after chemo.  Neulasta in opic the following day.    Based on her pheo and now breast cancer history, I recommend genetic counseling -- referral to [facility] made at prior visit    2. Anxiety/Depression: continue celexa 20 mg daily.  She states this has improved her depression.  Continue ativan 1 mg in the evening and 0.5 mg qhs.  continue clonazepam 0.5 mg bid.    3. Headache:  Percocet; warned about constipation, improved    4. Axilla pain:  Continue percocet; no palpable seroma today. Will see Dr. Maxwell Marion this week    5. Nausea:  sancuso patch; emend and aloxi; phenergan prn.    6. Dysuria:  resolved    7. Constipation: colace and senna prn.    8. Allergic reaction:  Likely to levaquin, added as allergy; continue benadryl cream and PO prn benadryl; rx atarax 50 mg tid prn; improving per patient, will not give another steroid dose.  Should not pose any issues for chemotherapy next week.          Follow-up Disposition: Not on File    Dortha Kern, MD

## 2013-02-20 NOTE — Progress Notes (Signed)
HISTORY OF PRESENT ILLNESS  Emily [name] is a 62 y.o. female.  HPI   ESTABLISHED patient here with complaints of pain in the LEFT axilla as well as a swelling and pulling sensation.  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  Today pain is 6/10.  Has had many side effects from her chemotherapy.  Has only one more treatment left next week.  Is S/P LEFT mastectomy with tissue expander in 11/2012.     ROS    Physical Exam   Cardiovascular: Normal rate and normal heart sounds.    Pulmonary/Chest: Breath sounds normal. Right breast exhibits no inverted nipple, no mass, no nipple discharge, no skin change and no tenderness. Left breast exhibits no inverted nipple, no mass, no nipple discharge, no skin change and no tenderness. Breasts are symmetrical.       Lymphadenopathy:        Right cervical: No superficial cervical, no deep cervical and no posterior cervical adenopathy present.       Left cervical: No superficial cervical, no deep cervical and no posterior cervical adenopathy present.        Right axillary: No pectoral and no lateral adenopathy present.        Left axillary: No pectoral and no lateral adenopathy present.      ASSESSMENT and PLAN  Encounter Diagnoses   Name Primary?   ??? Breast cancer, stage 2 Yes   ??? Seroma        Has a small seroma in the axilla that is symptomatic.  Tolerated aspiration well. Getting PT for axillary web.      BREAST ULTRASOUND  Indication: left axillar mass   Technique:  The area was scanned using a high-frequency linear-array near-field transducer  Findings: 1.2 cm mass, oval, hypoechoic, through transmission  Impression: seroma  Disposition: aspiration      Aspiration of Seroma/Hematoma  Indication : Seroma:  left  axilla  Prep : We cleaned the skin with alcohol.   Anesthesia : We anesthetized with 0 cc Xylocaine.   Guidance : We used Ultrasound for guidance.   Aspiration : We advanced an 18 gauge needle into the fluid  collection.   Yield : We aspirated 3 cc of fluid.   Effect : This decompressed the fluid collection and relieved discomfort

## 2013-02-20 NOTE — Communication Body (Signed)
HISTORY OF PRESENT ILLNESS  Emily [name] is a 61 y.o. female.  HPI   ESTABLISHED patient here with complaints of pain in the LEFT axilla as well as a swelling and pulling sensation.  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  Today pain is 6/10.  Has had many side effects from her chemotherapy.  Has only one more treatment left next week.  Is S/P LEFT mastectomy with tissue expander in 11/2012.     ROS    Physical Exam   Cardiovascular: Normal rate and normal heart sounds.    Pulmonary/Chest: Breath sounds normal. Right breast exhibits no inverted nipple, no mass, no nipple discharge, no skin change and no tenderness. Left breast exhibits no inverted nipple, no mass, no nipple discharge, no skin change and no tenderness. Breasts are symmetrical.       Lymphadenopathy:        Right cervical: No superficial cervical, no deep cervical and no posterior cervical adenopathy present.       Left cervical: No superficial cervical, no deep cervical and no posterior cervical adenopathy present.        Right axillary: No pectoral and no lateral adenopathy present.        Left axillary: No pectoral and no lateral adenopathy present.      ASSESSMENT and PLAN  Encounter Diagnoses   Name Primary?   ??? Breast cancer, stage 2 Yes   ??? Seroma        Has a small seroma in the axilla that is symptomatic.  Tolerated aspiration well. Getting PT for axillary web.      BREAST ULTRASOUND  Indication: left axillar mass   Technique:  The area was scanned using a high-frequency linear-array near-field transducer  Findings: 1.2 cm mass, oval, hypoechoic, through transmission  Impression: seroma  Disposition: aspiration      Aspiration of Seroma/Hematoma  Indication : Seroma:  left  axilla  Prep : We cleaned the skin with alcohol.   Anesthesia : We anesthetized with 0 cc Xylocaine.   Guidance : We used Ultrasound for guidance.   Aspiration : We advanced an 18 gauge needle into the fluid  collection.   Yield : We aspirated 3 cc of fluid.   Effect : This decompressed the fluid collection and relieved discomfort

## 2013-02-20 NOTE — Progress Notes (Signed)
[facility]  Lymphedema Clinic and Cancer Rehabilitation  [address]  [address]  [place], [place]  [address]      INITIAL EVALUATION    NAME: Emily [name] AGE: 62 y.o.  GENDER: female  DATE: 02/20/2013  REFERRING PHYSICIAN: Orlin Hilding, MD   HISTORY AND BACKGROUND:  Pt is a 62 y/o female found to have 2.6 cm LN L axilla 09/24/2012.  Underwent biopsy on 10/02/2012 with findings of metastatic neoplasm.  MRI on 10/05/2012 revealed L breast mass in addition to lymph node.  Biopsy of breast mass on 10/16/2012 showed poorly differentiated carcinoma.  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  Pt received care to address L UE lymphedema at [facility], and comes to this clinic for a second opinion per order from Dr. Elroy Channel.  Pt reporting severe pain L axilla, stating she has a "fluid pocket" in region, severe pain with shoulder movement, and feeling of "fullness" L upper arm. Pt states symptoms have been present since surgery.  Pt also reports bilat shoulder dysfunction prior to cancer diagnosis, having undergone R rotator cuff repair 7 years ago, L rotator cuff repair 3 years ago. Further medical history as noted below.   Primary Diagnosis:  ?? 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 Malignant neoplasm of breast (174.0)  ?? Pain in joint (719.41)  Date of Onset: 11/29/2011  Present Symptoms and Functional Limitations: Severe pain L axilla/upper arm/upper quadrant anterior/lateral aspect.    Shoulder Functional Scale: 12/80 indicating 80% impairment  Past Medical History:   Past Medical History   Diagnosis Date   ??? Cancer      left breast  cancer   ??? Anxiety    ??? Arthritis    ??? Nausea & vomiting    ??? Concussion 2011   ??? Chronic  tension headaches    ??? Headache      OCCAS   ??? Other ill-defined conditions      PHEOCHROMOCYTOMA     Past Surgical History   Procedure Laterality Date   ??? Pr breast surgery procedure unlisted  '91     breast implants   ??? Hx heent       T&A   ??? Hx heent       right adrenal removed   ??? Pr abdomen surgery proc unlisted       hernia mesh repair   ??? Hx appendectomy     ??? Hx gyn       c-sections   ??? Hx oophorectomy       right   ??? Hx orthopaedic       bilateral shoulder arthroscopy   ??? Hx modified radical mastectomy  11/28/2012     LEFT BREAST MODIFIED RADICAL MASTECTOMY W/ RECONSTRUCTION LEFT BREAST WITH TISSUE EXPANDER AND ALLODERM;PORTACATH INSERTION performed by Binnie Rail, MD at [facility] AMBUL[facility]ORY OR   ??? Hx breast reconstruction  11/28/2012     BREAST RECONSTRUCTION /C  INSERTION EXPANDER & ALLODERM performed by Ivar Bury, MD at [facility] AMBULATORY OR     Current Medications:    Current Outpatient Prescriptions   Medication Sig   ??? oxyCODONE-acetaminophen (PERCOCET) 5-325 mg per tablet Take 1-2 Tabs by mouth every four (4) hours as needed for Pain.   ???  DIPHENHYDRAMINE HCL (BENADRYL ALLERGY PO) Take  by mouth.   ??? hydrOXYzine (ATARAX) 50 mg tablet Take 1 Tab by mouth every six (6) hours as needed for Itching for 10 days.   ??? oxyCODONE-acetaminophen (PERCOCET) 5-325 mg per tablet Take 1 Tab by mouth every four (4) hours as needed for Pain.   ??? OTHER cranial prosthesis   ??? clonazePAM (KLONOPIN) 0.5 mg tablet Take 1 Tab by mouth two (2) times a day.   ??? valACYclovir (VALTREX) 500 mg tablet Take 1 tab by mouth two (2) times a day for 3 days then 500mg  once daily.   ??? Granisetron (SANCUSO) 3.1 mg/24 hour ptwk Apply one patch to upper arm 24 hours prior to chemotherapy and leave in place for 7 days.   ??? codeine-butalbital-acetaminophen-caffeine (FIORICET WITH CODEINE) 50-325-40-30 mg per capsule Take 1 Cap by mouth every four (4) hours as needed for Headache.   ??? magic mouthwash solution Magic mouth wash   Maalox   Lidocaine 2% viscous   Diphenhydramine oral solution   Pharmacy to mix equal portions of ingredients to a total volume as indicated in the dispense amount.  Swish and spit 15-38ml Q 4 hours as needed for mouth pain. May swallow for throat pain.   ??? citalopram (CELEXA) 20 mg tablet Take 1 Tab by mouth daily.   ??? promethazine (PHENERGAN) 12.5 mg tablet Take 1 Tab by mouth every six (6) hours as needed for Nausea.   ??? DOCUSATE SODIUM (COLACE PO) Take  by mouth.   ??? dexamethasone (DECADRON) 4 mg tablet Take 2 Tabs by mouth daily. 8 mg twice a day the day before and day after chemo   ??? lidocaine-prilocaine (EMLA) topical cream Apply  to affected area as needed for Pain.   ??? ondansetron hcl (ZOFRAN) 8 mg tablet Take 1 Tab by mouth every eight (8) hours as needed for Nausea.     No current facility-administered medications for this encounter.     Facility-Administered Medications Ordered in Other Encounters   Medication Dose Route Frequency   ??? [START ON 02/26/2013] DOCEtaxel (TAXOTERE) 120 mg in 0.9% sodium chloride 250 mL, overfill volume 25 mL chemo infusion  120 mg IntraVENous ONCE   ??? [START ON 02/26/2013] cyclophosphamide (CYTOXAN) 975 mg in 0.9% sodium chloride 250 mL, overfill volume 25 mL chemo infusion  975 mg IntraVENous ONCE   ??? [START ON 02/26/2013] palonosetron HCl (ALOXI) injection 0.25 mg  0.25 mg IntraVENous ONCE   ??? [START ON 02/26/2013] 0.9% sodium chloride infusion  250 mL/hr IntraVENous CONTINUOUS   ??? [START ON 02/26/2013] fosaprepitant (EMEND) 150 mg in 0.9% sodium chloride 150 mL IVPB  150 mg IntraVENous ONCE   ??? [START ON 02/26/2013] LORazepam (ATIVAN) injection 2 mg  2 mg IntraVENous ONCE   ??? [START ON 02/26/2013] dexamethasone (DECADRON) 4 mg/mL injection 20 mg  20 mg IntraVENous ONCE     Allergies:   Allergies   Allergen Reactions   ??? Aspirin Unknown (comments)     Gi bleeding   ??? Compazine (Prochlorperazine) Rash   ??? Keflex (Cephalexin) Rash   ??? Levaquin (Levofloxacin) Hives   ??? Nsaids (Non-Steroidal  Anti-Inflammatory Drug) Swelling     Throat swells   ??? Prednisone Rash      Social/Work History and Prior Level of Function:  Pt to appt today with her significant other, who is a Nutritional therapist.  Pt works as an Print production planner in medical office, stating she works 15-20 hours/week, with modified work routine  prior to mastectomy secondary to shoulder dysfunction, however, states she is now having difficulty performing even light duty tasks, such as managing telephones due to pain.  Pt works in [place], [place].   Living Situation: Pt lives near her work place, which is a good distance from this clinic, probably an hour one way.      Trainable Caregiver?: Gentleman with pt today is an ob-gyn, interested in patient's condition.     Self-care/ADLs: performing mod I, additional time, UE dressing deliberately to avoid pain L UE.     Mobility: indep transfers, bed mobility, gait in clinic.   Sleeping Arrangement:  Bed.   Adaptive Equipment Owned: na  Previous Therapy:  Pt received therapy for lymphedema management at [facility].  Pt states she is pursuing purchasing a pump, which by her description, sounds like Lymphapress Comfy Sleeve.  Pt states she initially wanted Flexitouch, however, they are out of network with her insurance company.  Pt has a Lymphediva compression sleeve, however, reports it was not fit and she has not worn garment.   Compression/Lymphedema Equipment:  Lymphediva compression sleeve.    SUBJECTIVE:   Pt reports pain L UE/axilla severed in nature, stating she tries to make it through the day without pain medication, however, generally has to take a Percocet late afternoon, and then again at bedtime to manage pain.  Pt reports she performs limited UE activities secondary to pain, avoiding lifting, even light loads.  Pt reports frustration with work limitations caused by pain at this time.  Pt states she received pump trial with pump she is trying to obtain, stating, "I felt the  fluid just leave when I was in the pump."  Pt expressed concern regarding lymphedema as XRT begins.  Pt states R shoulder now painful which she attributes to overusing extremity as she is limiting use of L UE. Pt reports significant side effects from chemotherapy.  Patient???s goals for therapy: decrease pain L UE.  Relief of "fluid" which she identifies in upper quadrant/axilla/upper arm.    EVALUATION AND OBJECTIVE DATA SUMMARY:   Pain:  Pain Scale 1: Numeric (0 - 10)  Pain Intensity 1: 6  Pain Location 1: Arm (axilla)  Skin and Tissue Assessment:  Dermal Status:  ( x)  Intact ( )  Dry   ( )  Tenuous ( )  Flaky   ( )  Wound/lesion present ( x)  Scars:  Incision L breast/axilla intact, min tension noted.     ( )  Dermatitis    Texture/Consistency:  (x )  Boggy:  Proximal aspect of L UE, primarily in shoulder cap region. Seroma L axilla? Area tender to touch.  Pt to see Dr. Maxwell Marion today to address this concern.   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   ( )  Vesicles   ( )  Petechiae ( )  Warty Vercusis   ( )  Bullae ( )  Papilloma   Circulatory:  ( )  ABI ( )  Varicosities:   ( x)  Pulses: Palpable ( )  Vascular studies ruled out DVT in past   ( )  DVT History    Nails:  (x )  Normal  ( )  Fungus  Stemmers Sign: negative  Height:  Height: 5\' 6"  (167.6 cm)  Weight:  Weight: 55.339 kg (122 lb)   BMI:  BMI (calculated): 19.7  (36 or greater: adversely affecting lymphedema)  Volumetric Measurements:   Right:  1,651.87 mL Left:  1,585.38 mL   % Difference: -4.03 Dominance: Right   (See scanned graph)  Range of Motion: R shoulder flex 90, abd 90, with elbow, wrist,  hand WFL.  Flex 85, Abd 85, ER 30, IR 50.  Pt unable to place hand behind head, behind back.  Strength: Shoulder strength not assessed secondary to c/o pain with ROM.  Elbow, wrist, hand R 4+/5, L 4-/5.  Sensation:  Pt c/o numbness upper arm, lateral upper quadrant.  Mobility:  Bed/Chair Mobility:  indep Transfers:  indep   Sitting Balance:  good Standing Balance:  good   Gait:  indep Wheelchair Mobility:  na   Endurance:  Appears intact for current level of activity. Pt states she is walking, tolerating up to a mile, however, would like to improve exercise tolerance. Stairs:  Not assessed.       Safety:  Patient is alert and oriented:  x4   Safety awareness:  intact   Fall Risk?:  Low, use of pain meds evening hours.      Precautions:  Standard lymphedema precautions to include avoiding blood pressure readings, injections and IVs or other procedures/acts that could lead to broken skin on affected area, and avoiding excessive heat, resistive activity or altitude without compression garment    Evaluation Time: 31 minutes    TREATMENT PROVIDED:   1.  Treatment description:  The patient was instructed in the lymphedema management protocol of complete decongestive therapy (CDT).  This includes skin care to prevent breakdown or infection, lymphedema exercises, custom compression therapy options (bandaging, compression garments, compression pump, Rosamaria Lints, JoViPak, [employer], etc. as needed), and decongestion with manual lymphatic drainage as indicated.  We discussed the need for consistent compression system for lymphedema management.  Diaphragmatic breathing instruction and skin care education was performed,applying low pH lotion to extremity using upward strokes to stimulate lymphatic vessels, directing toward posterior upper arm/supraclavicular region.  Due to current limb volumes stable, pt advised that, due to significant pain, compression sleeve will be fit at a later date to allow her to wear intermittently  during radiation therapy.  Gentle STM performed sub scapularis, pect major/minor today, with multiple trigger points noted in musculature, with pt tolerating very light pressure only.  Gentle mobilization of axillary cording, applying stretch/counter stretch, again, gentle skin stretch technique, to address cording within limits of pain.  Pt instructed to continue self mobilization of cording in supine position with UE supported, moving shoulder into abd to range of comfort, adding wrist flex/ext 10 reps, deliberate movement, within limits of pain.  Pt demonstrates good understanding of exercise.  Further assessment regarding recommendation for vasopneumatic pump for home use.  Treatment time:  44 minutes  Manual therapy 3      ASSESSMENT:   Babetta Paterson is a 62 y.o. 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  Pt tolerated manual therapy with light pressure approach, arriving with  significant pain in region, being hypersensitive to touch. Trigger points noted upper quadrant musculature as noted above.  Pt would benefit from kinesiotape as indicated, with test strip to be applied next appt. Question whether pt may be dealing with some degree of neuropathic pain, or if pain is musculoskeletal in nature.  Will continue to assess as treatment progresses. Patient will benefit from complete decongestive therapy (CDT) including manual lymphatic drainage (MLD) technique, compression garment fit to be worn as indicated, and kinesiotaping as appropriate.  Patient will receive instruction in proper skin care to recognize signs/symptoms of and prevent infection, therapeutic exercise, and self-MLD for independent home program and restorative lymphatic performance.  Pt may also benefit from limited modality use for pain management.    This care is medically necessary due to the infection risk with lymphedema, and  to improve functional activities.  CDT is necessary to resolve swelling to allow patient to return to wearing normal clothes/footwear, and prevent worsening of symptoms, such as venous stasis ulcerations, infections, or hospitalizations.  Patient will be independent with home program strategies to allow improved ADL ability and mobility and to allow patient to return to greatest functional independence.    Rehabilitation potential is considered to be Good.  Factors which may influence rehabilitation potential include significant pain at this time possibly limiting treatment, as well as prior shoulder dysfunction still present, however, pt motivated to improve.  Patient will benefit from 1-2x/week physical therapy visits over 4-8 weeks to optimize improvement in these areas.    PLAN OF CARE:   Recommendations and Planned Interventions:  Manual lymph drainage/complete decongestive therapy  Compression garment fitting/provision  Lymphedema therapeutic exercise  AROM/PROM/Strength/Coordination  Self-care training  Functional mobility training  Education in skin care and lymphedema precautions  Self-MLD education per home program  Self-bandaging education per home program  Caregiver education as needed  Wound care as needed     GOALS  Short term goals  Time frame: 2-4 weeks  1. Instruct the patient to be independent with proper skin care to prevent future skin breakdown and decrease the potential risk for infections that are associated with Lymphedema.  2. Patient will be independent with a personal lymphedema exercise program to assist with the lymphatic flow and reduce proximal shoulder cap measurement L UE by 5mm.  3. Patient will understand the signs and symptoms of acute infection.  Long term goals  Time frame: 4-8 weeks  1.   Patient will have knowledge of the compression options and acquire a safe and   appropriate daytime and night time compression system to prevent    re-accumulation of fluid.  2.  Patient or  family will be able to don/doff garments independently.  Garment   system effectiveness will be evaluated prior to discharge for better long-term   management and outcomes.  3.  Improvement in UEFS by 15-25 points allowing pt to perform at shoulder level activities with 75% less pain.    4.  L shoulder ROM improved by 15-30 degrees at motions with 75% less pain at end range.   5.   To transition patient to independent restorative phase of CDT.           Patient has participated in goal setting and agrees to work toward plan of care.  Patient was instructed to call if questions or concerns arise.    Thank you for this referral.  Theodoro Doing, PT, CLT    Time Calculation: 78 mins    TREATMENT PLAN  EFFECTIVE DATES:   02/20/2013 TO 05/23/2013  I have read the above plan of care for [facility].  I certify the above prescribed services are required by this patient and are medically necessary.  The above plan of care has been developed in conjunction with the lymphedema/physical therapist.       Physician Signature: ____________________________________Date:______________

## 2013-02-21 MED FILL — CYCLOPHOSPHAMIDE 500 MG IV SOLUTION: 500 mg | INTRAVENOUS | Qty: 975

## 2013-02-21 MED FILL — DOCETAXEL 20 MG/2 ML (10 MG/ML) IV SOLN: 20 mg/2 mL (10 mg/mL) | INTRAVENOUS | Qty: 12

## 2013-02-21 MED FILL — SODIUM CHLORIDE 0.9 % IV: INTRAVENOUS | Qty: 1000

## 2013-02-21 MED FILL — EMEND 150 MG INTRAVENOUS SOLUTION: 150 mg | INTRAVENOUS | Qty: 5

## 2013-02-26 LAB — CBC WITH 3 PART DIFF
ABS. LYMPHOCYTES: 1.1 10*3/uL (ref 0.8–3.5)
ABS. MIXED CELLS: 0.8 10*3/uL (ref 0.2–1.2)
ABS. NEUTROPHILS: 3.5 10*3/uL (ref 1.8–8.0)
HCT: 30.9 % — ABNORMAL LOW (ref 35.0–47.0)
HGB: 10 g/dL — ABNORMAL LOW (ref 11.5–16.0)
LYMPHOCYTES: 21 % (ref 12–49)
MCH: 30.4 PG (ref 26.0–34.0)
MCHC: 32.4 g/dL (ref 30.0–36.5)
MCV: 93.9 FL (ref 80.0–99.0)
Mixed cells: 15 % (ref 3.2–16.9)
NEUTROPHILS: 64 % (ref 32–75)
PLATELET: 259 10*3/uL (ref 150–400)
RBC: 3.29 M/uL — ABNORMAL LOW (ref 3.80–5.20)
RDW: 18.2 % — ABNORMAL HIGH (ref 11.8–15.8)
WBC: 5.4 10*3/uL (ref 3.6–11.0)

## 2013-02-26 MED ADMIN — palonosetron HCl (ALOXI) injection 0.25 mg: INTRAVENOUS | @ 15:00:00 | NDC 62856079701

## 2013-02-26 MED ADMIN — cyclophosphamide (CYTOXAN) 975 mg in 0.9% sodium chloride 250 mL, overfill volume 25 mL chemo infusion: INTRAVENOUS | @ 18:00:00 | NDC 10019095550

## 2013-02-26 MED ADMIN — dexamethasone (DECADRON) 4 mg/mL injection 20 mg: INTRAVENOUS | @ 16:00:00 | NDC 63323016505

## 2013-02-26 MED ADMIN — heparin (porcine) pf 500 Units: INTRAVENOUS | @ 19:00:00

## 2013-02-26 MED ADMIN — 0.9% sodium chloride infusion: INTRAVENOUS | @ 15:00:00 | NDC 00409798303

## 2013-02-26 MED ADMIN — sodium chloride 0.9 % injection 10 mL: INTRAVENOUS | @ 14:00:00 | NDC 00409488810

## 2013-02-26 MED ADMIN — DOCEtaxel (TAXOTERE) 120 mg in 0.9% sodium chloride 250 mL, overfill volume 25 mL chemo infusion: INTRAVENOUS | @ 16:00:00 | NDC 66758005001

## 2013-02-26 MED ADMIN — sodium chloride (NS) flush 10 mL: INTRAVENOUS | @ 19:00:00 | NDC 87701099893

## 2013-02-26 MED ADMIN — sodium chloride (NS) flush 10 mL: INTRAVENOUS | @ 14:00:00 | NDC 87701099893

## 2013-02-26 MED ADMIN — fosaprepitant (EMEND) 150 mg in 0.9% sodium chloride 150 mL IVPB: INTRAVENOUS | @ 16:00:00 | NDC 00409798361

## 2013-02-26 MED ADMIN — LORazepam (ATIVAN) injection 2 mg: INTRAVENOUS | @ 16:00:00 | NDC 00641604401

## 2013-02-26 MED FILL — BD POSIFLUSH NORMAL SALINE 0.9 % INJECTION SYRINGE: INTRAMUSCULAR | Qty: 30

## 2013-02-26 MED FILL — LORAZEPAM 2 MG/ML IJ SOLN: 2 mg/mL | INTRAMUSCULAR | Qty: 1

## 2013-02-26 MED FILL — SODIUM CHLORIDE 0.9 % INJECTION: INTRAMUSCULAR | Qty: 10

## 2013-02-26 MED FILL — DEXAMETHASONE SODIUM PHOSPHATE 4 MG/ML IJ SOLN: 4 mg/mL | INTRAMUSCULAR | Qty: 5

## 2013-02-26 MED FILL — HEPARIN LOCK FLUSH (PORCINE) (PF) 100 UNIT/ML INTRAVENOUS SYRINGE: 100 unit/mL | INTRAVENOUS | Qty: 5

## 2013-02-26 MED FILL — ALOXI 0.25 MG/5 ML INTRAVENOUS SOLUTION: 0.25 mg/5 mL | INTRAVENOUS | Qty: 1

## 2013-02-26 NOTE — Progress Notes (Signed)
[facility]  [address], [address]   [place], [place]   [address]  W: [phone]   F: [phone]      f/u HEME/ONC CONSULT      Reason for visit: evaluation for treatment for  Breast Cancer    Consulting physician:  Dr. Dan Humphreys, Dr. Fernanda Drum, Montez Hageman.  Ref physician:  Dr. Maxwell Marion    HPI:   Emily [name] is a 62 y.o.  female who I am seeing for adjuvant therapy for breast cancer.        She had a screening ultrasound on 09/24/12 showing a 2.6 cm left LN.  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  HER 2 negative IHC at 0.    She has a history of a pheochromocytoma surgically removed in 1997.    She then had a MRI breast on 10/05/12 showing the 2.8 x 2 cm LN as well as a left breast LIQ 0.9 cm x 1.1 cm x 0.6 cm mass.  Biopsy of that mass by FNA on 10/16/12 shows a poorly differentiated carcinoma.  These slides were air-dried, HER 2 not able to be performed on them.    Mammaprint of LN shows high risk luminal, ER + at 0.19, PR + at 0.61, HER 2 negative at -0.69.    She underwent L lumpectomy on 11/28/12 showing IDC with neuroendocrine features, gr 2, o.5 cm, DCIS gr 2-3, + LVI, 1/14 LN + with a 1 cm met, no ECE.  HER 2 negative (ratio 1.2, sig/cell 2.8), ER + 95%, PR + 5%; ki67 5%; DCIS ER + 95%, PR + 1%.    Interval history: s/p TC #3 on 5/6. She has seen lymphedema and was told she has webbing in axilla and pectorial on left side.  She was seen by Dr. Maxwell Marion and had a seroma drained.  She felt very poorly the first week after chemo due to generalized body pain and urinary pain.  Hives lasted 7-10 days and have since resolved. She wonders if anxiety is contributing to hives. She reports headaches have persisted with  this cycle and that Percocet was not as effective as Dilaudid at treating headache and bone aches.  LMP 1996.    Gr 1 loss of appetite, gr 1 hot flashes, 6/10 pain, gr 1 cough, gr 3 itching and rash over the last week but now resolved, gr 2 headache.    DX   Encounter Diagnoses   Name Primary?   ??? Breast cancer, stage 2 Yes   ??? Pain    ??? Anxiety    ??? Depression    ??? Itching       Past Medical History   Diagnosis Date   ??? Cancer      left breast  cancer   ??? Anxiety    ??? Arthritis    ??? Nausea & vomiting    ??? Concussion 2011   ??? Chronic tension headaches    ??? Headache      OCCAS   ??? Other ill-defined conditions      PHEOCHROMOCYTOMA     Past Surgical History   Procedure Laterality Date   ??? Pr breast surgery procedure unlisted  '91     breast implants   ??? Hx heent  T&A   ??? Hx heent       right adrenal removed   ??? Pr abdomen surgery proc unlisted       hernia mesh repair   ??? Hx appendectomy     ??? Hx gyn       c-sections   ??? Hx oophorectomy       right   ??? Hx orthopaedic       bilateral shoulder arthroscopy   ??? Hx modified radical mastectomy  11/28/2012     LEFT BREAST MODIFIED RADICAL MASTECTOMY W/ RECONSTRUCTION LEFT BREAST WITH TISSUE EXPANDER AND ALLODERM;PORTACATH INSERTION performed by Binnie Rail, MD at [facility] AMBULATORY OR   ??? Hx breast reconstruction  11/28/2012     BREAST RECONSTRUCTION /C  INSERTION EXPANDER & ALLODERM performed by Ivar Bury, MD at [facility] AMBULATORY OR     History     Social History   ??? Marital Status: SINGLE     Spouse Name: N/A     Number of Children: N/A   ??? Years of Education: N/A     Social History Main Topics   ??? Smoking status: Never Smoker    ??? Smokeless tobacco: Never Used   ??? Alcohol Use: No   ??? Drug Use: No   ??? Sexually Active: Not on file     Other Topics Concern   ??? Not on file     Social History Narrative   ??? No narrative on file     Family History   Problem Relation Age of Onset   ??? Alzheimer Mother    ??? Diabetes Mother    ??? Other Mother      COMBATIVE POST ANESTHESIA    ??? Heart Disease Father    ??? Diabetes Father    ??? Lung Disease Father      COPD   ??? Headache Sister    ??? Migraines Sister        Current Outpatient Prescriptions   Medication Sig Dispense Refill   ??? desloratadine (CLARINEX) 5 mg tablet Take 5 mg by mouth daily.       ??? diphenhydrAMINE (BENADRYL) 50 mg tablet Take 1 Tab by mouth nightly as needed for Itching.  30 Tab  0   ??? clonazePAM (KLONOPIN) 0.5 mg tablet Take 1 Tab by mouth two (2) times a day.  60 Tab  2   ??? citalopram (CELEXA) 20 mg tablet Take 1 Tab by mouth daily.  30 Tab  4   ??? HYDROmorphone (DILAUDID) 2 mg tablet Take 2 Tabs by mouth every four (4) hours as needed (every 3-4 hours).  70 Tab  0   ??? anastrozole (ARIMIDEX) 1 mg tablet Take 1 Tab by mouth daily.  30 Tab  11   ??? OTHER cranial prosthesis  1 Each  0   ??? Granisetron (SANCUSO) 3.1 mg/24 hour ptwk Apply one patch to upper arm 24 hours prior to chemotherapy and leave in place for 7 days.  1 Patch  2   ??? DOCUSATE SODIUM (COLACE PO) Take  by mouth.       ??? dexamethasone (DECADRON) 4 mg tablet Take 2 Tabs by mouth daily. 8 mg twice a day the day before and day after chemo  32 Tab  3   ??? hydrOXYzine (ATARAX) 50 mg tablet Take 1 Tab by mouth every six (6) hours as needed for Itching for 10 days.  30 Tab  0   ??? oxyCODONE-acetaminophen (PERCOCET) 5-325 mg  per tablet Take 1 Tab by mouth every four (4) hours as needed for Pain.  50 Tab  0   ??? valACYclovir (VALTREX) 500 mg tablet Take 1 tab by mouth two (2) times a day for 3 days then 500mg  once daily.  30 Tab  0   ??? codeine-butalbital-acetaminophen-caffeine (FIORICET WITH CODEINE) 50-325-40-30 mg per capsule Take 1 Cap by mouth every four (4) hours as needed for Headache.  30 Cap  1   ??? magic mouthwash solution Magic mouth wash   Maalox  Lidocaine 2% viscous   Diphenhydramine oral solution   Pharmacy to mix equal portions of ingredients to a total volume as indicated in the dispense amount.  Swish and spit 15-70ml Q 4 hours as needed for mouth pain. May  swallow for throat pain.  500 mL  2   ??? promethazine (PHENERGAN) 12.5 mg tablet Take 1 Tab by mouth every six (6) hours as needed for Nausea.  10 Tab  1   ??? lidocaine-prilocaine (EMLA) topical cream Apply  to affected area as needed for Pain.  30 g  0   ??? ondansetron hcl (ZOFRAN) 8 mg tablet Take 1 Tab by mouth every eight (8) hours as needed for Nausea.  60 Tab  3     Facility-Administered Medications Ordered in Other Visits   Medication Dose Route Frequency Provider Last Rate Last Dose   ??? sodium chloride 0.9 % injection 10 mL  10 mL IntraVENous PRN Carmina Miller, MD   10 mL at 02/26/13 0930   ??? heparin (porcine) pf 500 Units  500 Units IntraVENous PRN Carmina Miller, MD   500 Units at 02/26/13 1438   ??? sodium chloride (NS) flush 10 mL  10 mL IntraVENous PRN Carmina Miller, MD   10 mL at 02/26/13 1438   ??? [START ON 02/28/2013] pegfilgrastim (NEULASTA) injection 6 mg  6 mg SubCUTAneous ONCE Leotis Shames, MD       ??? [COMPLETED] DOCEtaxel (TAXOTERE) 120 mg in 0.9% sodium chloride 250 mL, overfill volume 25 mL chemo infusion  120 mg IntraVENous ONCE Leotis Shames, MD   120 mg at 02/26/13 1220   ??? [COMPLETED] cyclophosphamide (CYTOXAN) 975 mg in 0.9% sodium chloride 250 mL, overfill volume 25 mL chemo infusion  975 mg IntraVENous ONCE Leotis Shames, MD   975 mg at 02/26/13 1342   ??? [COMPLETED] palonosetron HCl (ALOXI) injection 0.25 mg  0.25 mg IntraVENous ONCE Leotis Shames, MD   0.25 mg at 02/26/13 1128   ??? 0.9% sodium chloride infusion  250 mL/hr IntraVENous CONTINUOUS Leotis Shames, MD   250 mL/hr at 02/26/13 1125   ??? [COMPLETED] fosaprepitant (EMEND) 150 mg in 0.9% sodium chloride 150 mL IVPB  150 mg IntraVENous ONCE Leotis Shames, MD   150 mg at 02/26/13 1145   ??? [COMPLETED] LORazepam (ATIVAN) injection 2 mg  2 mg IntraVENous ONCE Leotis Shames, MD   2 mg at 02/26/13 1130   ??? [COMPLETED] dexamethasone (DECADRON) 4 mg/mL injection 20 mg  20 mg IntraVENous ONCE Leotis Shames, MD   20 mg  at 02/26/13 1135       Allergies   Allergen Reactions   ??? Aspirin Unknown (comments)     Gi bleeding   ??? Compazine (Prochlorperazine) Rash   ??? Keflex (Cephalexin) Rash   ??? Levaquin (Levofloxacin) Hives   ??? Nsaids (Non-Steroidal Anti-Inflammatory Drug) Swelling     Throat swells   ???  Prednisone Rash       Review of Systems    A comprehensive review of systems was performed and all systems were negative except for HPI and for the symptom report form, reviewed and scanned in.      Objective:  Physical Exam:  BP 117/75   Pulse 68   Temp(Src) 96 ??F (35.6 ??C) (Oral)   Resp 18   Ht 5\' 6"  (1.676 m)   Wt 236 lb (107.049 kg)   BMI 38.11 kg/m2   SpO2 100%    General:  Alert, cooperative, no distress, appears stated age.   Head:  Normocephalic, without obvious abnormality, atraumatic.   Eyes:  Conjunctivae/corneas clear. PERRL, EOMs intact.   Throat: Lips, mucosa, and tongue normal.    Neck: Supple, symmetrical, trachea midline, no adenopathy, thyroid: no enlargement/tenderness/nodules   Back:   Symmetric, no curvature. ROM normal. No CVA tenderness.   Lungs:   Clear to auscultation bilaterally.   Chest wall:  No tenderness or deformity.   Heart:  Regular rate and rhythm, S1, S2 normal, no murmur, click, rub or gallop.   Abdomen:   Soft, non-tender. Bowel sounds normal. No masses,  No organomegaly.   Extremities: Extremities normal, atraumatic, no cyanosis or edema.   Skin: Hives on bilateral arms and upper legs   Lymph nodes: Cervical, supraclavicular, and axillary nodes normal.   Neurologic: CNII-XII intact.       Diagnostic Imaging   Results for orders placed during the hospital encounter of 11/28/12   XR FLUOROSCOPY UNDER 60 MINUTES    Narrative **Final Report**       ICD Codes / Adm.Diagnosis: 174.9   / Malignant neoplasm of breast (    Examination:  CR FLUORO GUIDE UP TO 1 HR  - [id number] - Nov 28 2012  9:15AM  Accession No:  [id number]  Reason:  port a cath      REPORT:  Portable fluoroscopy was utilized in the Operating  Room.        IMPRESSION:  PORTABLE FLUOROSCOPY.      Fluoro time:  0.066 minutes      vk            Signing/Reading Doctor: BSR ADMINISTRATOR [phone])    Approved: BSR ADMINISTRATOR [id number])  Nov 30 2012  4:18PM                                  Results for orders placed in visit on 10/22/12   PET BREAST CANCER DIAGNOSIS   10/12/12 PET:  Negative for distant mets  10/22/12 brain MRI:  negative    Lab Results  Lab Results   Component Value Date/Time    WBC 5.4 02/26/2013  9:35 AM    HGB 10.0 02/26/2013  9:35 AM    HCT 30.9 02/26/2013  9:35 AM    PLATELET 259 02/26/2013  9:35 AM    MCV 93.9 02/26/2013  9:35 AM       Lab Results   Component Value Date/Time    Sodium 144 02/05/2013  9:44 AM    Potassium 4.4 02/05/2013  9:44 AM    Chloride 105 02/05/2013  9:44 AM    CO2 29 02/05/2013  9:44 AM    Anion gap 10 02/05/2013  9:44 AM    Glucose 101 02/05/2013  9:44 AM    BUN 14 02/05/2013  9:44 AM    Creatinine 0.32 02/05/2013  9:44  AM    BUN/Creatinine ratio 44 02/05/2013  9:44 AM    GFR est AA >60 02/05/2013  9:44 AM    GFR est non-AA >60 02/05/2013  9:44 AM    Calcium 8.8 02/05/2013  9:44 AM    AST 22 02/05/2013  9:44 AM    Alk. phosphatase 136 02/05/2013  9:44 AM    Protein, total 6.2 02/05/2013  9:44 AM    Albumin 3.8 02/05/2013  9:44 AM    Globulin 2.4 02/05/2013  9:44 AM    A-G Ratio 1.6 02/05/2013  9:44 AM    ALT 52 02/05/2013  9:44 AM     Assessment/Plan:  62 y.o. female with a 0.5 cm left breast cancer, IDC with endocrine features, 1/14 LN +, gr 2, high risk mammaprint,  ER and PR +, HER 2 negative. ER records reviewed and summarized above.  Labs reviewed from records, and u/a put in HPI.    PS 0    1. Breast cancer with neuroendocrine features, stage IIA, [id number]    With no further therapy, her RR is about 36%; endocrine therapy will decrease her RR to 19%; and 2nd gen chemo will decrease her RR to 12%; 3rd gen to 10%.  These #s may be higher with her high risk mammaprint.     She is agreeable to TC x 4, started 12/25/12. Goal is cure.  S/p #3.  #4 today    The  patient was given presciptions for a wig, emla cream, decadron to take 8 mg bid the day before and day after chemo.  Neulasta in opic the following day.    Based on her pheo and now breast cancer history, I recommend genetic counseling -- referral to [facility] made at prior visit. Sees them later this month    The risks and benefits of aromatase inhibitors (anastrozole, letrozole, and exemestane) were discussed in detail and the patient was informed of the following: Risks include the development of painful muscles and joints (arthralgia/myalgia) and bone loss. Muscle and joint pain can be severe but rarely result in any tissue damage; symptoms usually resolve in several weeks when the medication is stopped. Bone loss is common and a bone density test is recommended as a baseline and then yearly to every several years depending on initial results. The risk of fractures is increased by a few percent in patients taking these drugs, but careful monitoring of bone density and using bone protecting agents when indicated can minimize these risks. Unlike tamoxifen there is no increased risk of blood clots or endometrial cancer. AIs can cause or worsen vaginal dryness but women using these drugs should not use vaginal estrogen preparations for these symptoms. AIs can also cause or increase hot flashes. Any other symptoms should be reported. Patient educated to start anastrozole 1 week after radiation is complete, prescribed today.    Patient had Bone density test done December 2013 at [facility] and she will request a copy for [place]. She reports this showed osteopenia and osteoporosis both.      We discussed the data from Rick Duff al, SABCS 2013, for the meta-analysis for adjuvant bisphosphonate use in breast cancer.  We discussed that in postmenopausal women, it appears that the bisphosphate zolendronic acid, given as in ABCSG 12 at 4 mg IV q 6 months x 3 years, is beneficial in improving bone DFS and OS.  We discussed side  effects such as ONJ and the need to avoid invasive dental procedures while on  these medications, renal damage, and hypocalcemia.  We will discuss this further at her next visit.     Ok to have port removed.     2. Anxiety/Depression: continue celexa 20 mg daily.  She states this has improved her depression.  Continue ativan 1 mg in the evening and 0.5 mg qhs.  continue clonazepam 0.5 mg bid.    3. Headache: Refilled dilaudid (#70) today. Percocet not as effective, so this stopped; warned about constipation.     4. Nausea:  sancuso patch; emend and aloxi; phenergan prn.    5. Constipation: colace and senna prn.    6. Allergic reaction: Resolved.  Likely to levaquin, added as allergy; continue benadryl cream and PO prn benadryl; rx atarax 50 mg tid prn previously. Should not pose any issues for chemotherapy today.    > 25 minutes were spent with this patient with > 50% of that time spent in face to face counseling.    Follow-up Disposition:  Return in 3 months (on 05/29/2013).    Dortha Kern, MD

## 2013-02-26 NOTE — Progress Notes (Signed)
[facility] VISIT NOTE    [phone]  Pt arrived at [facility] ambulatory and in no distress for Cycle 4 of Docetaxel/Cyclophosphamide.  Assessment completed, pt states that she broke out in hives from taking Levaquin for a UTI. Levaquin was added to allergy list, UTI has resolved; however some hives still remain scattered throughout body. Pt also c/o fatigue and generalized body aches.     Blood pressure 107/64, pulse 71, temperature 98.5 ??F (36.9 ??C), resp. rate 16, height 5\' 6"  (1.676 m), weight 56.972 kg (125 lb 9.6 oz), SpO2 100.00%.    Right chest port accessed with 0.75in huber. Positive blood return noted; labs drawn.     Recent Results (from the past 12 hour(s))   CBC WITH 3 PART DIFF    Collection Time     02/26/13  9:35 AM       Result Value Range    WBC 5.4  3.6 - 11.0 K/uL    RBC 3.29 (*) 3.80 - 5.20 M/uL    HGB 10.0 (*) 11.5 - 16.0 g/dL    HCT 81.1 (*) 91.4 - 47.0 %    MCV 93.9  80.0 - 99.0 FL    MCH 30.4  26.0 - 34.0 PG    MCHC 32.4  30.0 - 36.5 g/dL    RDW 78.2 (*) 95.6 - 15.8 %    PLATELET 259  150 - 400 K/uL    NEUTROPHILS 64  32 - 75 %    MIXED CELLS 15  3.2 - 16.9 %    LYMPHOCYTES 21  12 - 49 %    ABS. NEUTROPHILS 3.5  1.8 - 8.0 K/UL    ABS. MIXED CELLS 0.8  0.2 - 1.2 K/uL    ABS. LYMPHOCYTES 1.1  0.8 - 3.5 K/UL    DF AUTOMATED         Medications received:  NS IV as ordered  Aloxi 0.25mg  IVP  Ativan 2mg  IVP  Decadron 20mg  IVP  Emend 150mg  IV  Taxotere 120mg  IV  Cytoxan 975mg  IV    Tolerated treatment well, no adverse reaction noted. Positive blood return noted post treatment. Port flushed and de-accessed per protocol.    Blood pressure 110/61, pulse 75, temperature 97.8 ??F (36.6 ??C), resp. rate 18, height 5\' 6"  (1.676 m), weight 56.972 kg (125 lb 9.6 oz), SpO2 100.00%.    1445   D/Cd from [facility] ambulatory and in no distress accompanied by husband.  Next appt 02/27/13 at 1530.

## 2013-02-26 NOTE — Progress Notes (Signed)
Emily [name] is a 62 y.o. female who is being seen for a follow up for breast cancer.

## 2013-02-26 NOTE — Patient Instructions (Signed)
Start Ananstrozole 1 week after radiation is complete.     Request a copy of bone density study for [place].     Return to see [place] in 3 months.

## 2013-02-26 NOTE — Progress Notes (Signed)
Problem: Chemotherapy Treatment  Goal: *Chemotherapy regimen followed  Outcome: Progressing Towards Goal  Patient to receive Cycle 4 of Docetaxel/Cytoxan today as ordered.

## 2013-02-27 ENCOUNTER — Encounter

## 2013-02-27 MED ADMIN — pegfilgrastim (NEULASTA) injection 6 mg: SUBCUTANEOUS | @ 20:00:00 | NDC 55513019001

## 2013-02-27 MED FILL — CYCLOPHOSPHAMIDE 500 MG IV SOLUTION: 500 mg | INTRAVENOUS | Qty: 25

## 2013-02-27 MED FILL — NEULASTA 6 MG/0.6 ML SUBCUTANEOUS SYRINGE: 6 mg/0. mL | SUBCUTANEOUS | Qty: 1

## 2013-02-27 NOTE — Progress Notes (Signed)
Problem: Patient Education: Go to Education Activity  Goal: Patient/Family Education  Outcome: Progressing Towards Goal  Pt received neulasta as ordered, pt instructed to call MD office if rectal bleeding worsens or continues, pt verbalized understanding.

## 2013-02-27 NOTE — Progress Notes (Signed)
[facility] Short Visit Progress Note    1545  Pt admit to [facility] for neulasta injection ambulatory in stable condition. Pt reports a small amount of rectal bleeding with BM yesterday, a few drops per pt. Otherwise no acute concerns voiced.    Blood pressure 105/61, pulse 72, temperature 98.3 ??F (36.8 ??C), resp. rate 16, SpO2 97.00%.    Called and notified Hal Hope, NP of pt's report of rectal bleeding with BM last night, no additional orders received. Pt instructed to notify MD if it continues or worsens.    Medications:  Neulasta 6 mg SQ in right arm    1558  Pt tolerated treatment well. D/c home ambulatory in no distress. Pt to follow up with MD for next appt.

## 2013-02-28 ENCOUNTER — Encounter

## 2013-02-28 NOTE — Addendum Note (Signed)
Addended by: PELLICANE, Marcelyn Ruppe V JR. on: 02/28/2013 10:47 AM     Modules accepted: Orders

## 2013-02-28 NOTE — Telephone Encounter (Signed)
Pt called and states that after neulasta injection yesterday, she developed hives on the left side of her body. Pt states that she went home and took steroid and benadryl. When she woke up this morning she states that she had hives on the right side of her body as well. She also states that she feels like her throat is a little tight. Pt denies SOB and able to speak normally without difficulty. Informed pt, per Dr. Elroy Channel, that steroid dose pack would be called in and for pt to take 8mg  of decadron now and to call office if symptoms worsen or fail to improve. Pt voiced understanding.

## 2013-03-04 NOTE — Progress Notes (Signed)
[facility]  Lymphedema Clinic and Cancer Rehabilitation  [address]  [address]  [place], [place]  [address]      LYmphedema Therapy  Visit: 2    [x]         Daily note               []        30 day/10th visit progress note    NAME: Emily [name]  DATE: 03/04/2013    GOALS   Short term goals   Time frame: 2-4 weeks   1. Instruct the patient to be independent with proper skin care to prevent future skin breakdown and decrease the potential risk for infections that are associated with Lymphedema.  2. Patient will be independent with a personal lymphedema exercise program to assist with the lymphatic flow and reduce proximal shoulder cap measurement L UE by 5mm.  3. Patient will understand the signs and symptoms of acute infection.  Long term goals   Time frame: 4-8 weeks   1. Patient will have knowledge of the compression options and acquire a safe and appropriate daytime and night time compression system to prevent re-accumulation of fluid.   2. Patient or family will be able to don/doff garments independently. Garment system effectiveness will be evaluated prior to discharge for better long-term management and outcomes.   3. Improvement in UEFS by 15-25 points allowing pt to perform at shoulder level activities with 75% less pain.   4. L shoulder ROM improved by 15-30 degrees at motions with 75% less pain at end range.   5. To transition patient to independent restorative phase of CDT.         SUBJECTIVE REPORT:  Patient states she has continued gentle shoulder abduction paired with wrist flexion/extension in supine position with UE supported.  Continues with significant pain L axilla, rating at 5/10 prior to treatment, 4/10 at conclusion of treatment, stating she is more "relaxed" as treatment concludes.  Pt reports bring compression sleeve to appt today, stating she has worn it intermittently when playing with her 7 year old son.   Pain:  Pain Scale 1:  Numeric (0 - 10)  Pain Intensity 1: 5  Pain Location 1: Arm (axilla)  Gait:  indep  ADLs:  Performing mod I at this time within limits of ROM bilat shoulders.  Treatment Response:  Pt reports pain reduced from 5/10 to 4/10 with treatment today.  Note improvement in L shoulder abd post treatment today.  See measurements below.  Reviewed packet received at evaluation.  Function: Pt able to don/doff compression sleeve in clinic today with ease.  Able to perform upper body dressing mod I with additional time and guarding L shoulder.  Began MLD (manual lymphatic drainage) techniques with lotioning and skin care and range of motion exercise routine without props.    TREATMENT AND OBJECTIVE DATA SUMMARY:   Patient/Family Education:      Educated in skin care: Reviewed skin care principles     ROM at time of eval L shoulder: Flex 85, Abd 85, ER 30, IR 50       Instructed in self MLD:   Written sequence given and reviewed with patient as well as demonstration and instruction during MLD portion of the session.  Instructed in don/doff of compression system:   Multi layer bandage (MLB) donning principles and wear precautions.       Therapeutic Exercise/Procedure 12:07-12:21 pm   14 minutes                  Home program: Patient to perform daily to BID skin care, deep abdominal breathing, exercise routine performed with assist of therapist today:  Pect stretch over small towel roll placed horizontally inferior edge of scapula, relaxing anterior upper quadrant attaining gentle pect stretch, holding for 30 seconds within limits of pain, performing x 1 in clinic, to continue 3-4x/day; scapular elevation/depression and protraction/retraction x 5 reps, with pt to perform slowly, within limits of pain, instructed to continue 2-3x/day.  Neural glide L shoulder at comfortable position into abduction, supine on bed with arm supported, adding wrist flex/ext to point of gentle, tolerable tension axilla, then relax, performing 10 reps  2-3x/day within limits of pain.     Rationale: Exercise will increase the lymph angiomotoricity and tissue pressure of the skin and thus decrease swelling.       Manual Therapy:  11:02-12:04 pm 62 minutes     Area to decongest: L UE/upper quadrant   Sequence used and effectiveness: Diaphragmatic breathing.  Activated celphalic vasovasorum.  Stim L supraclavicular lnn.  Full UE MLD sequence including medial to lateral technique upper arm, and additional time spent on shoulder cap area to address edematous region. STM pect major/minor/upper trap/subscap, with trigger points noted throughout upper quadrant musculature with good response to treatment. Gentle manual glides axillary webbing, initiating at distal aspect of cords, moving proximally into axilla.  Note small apparent seroma which was recently drained by Dr. Maxwell Marion per MD note, sensitive to touch.  G-h joint distraction and a-p, caudal glides performed within limits of pain.  Gentle PROM L shoulder flex 88 degrees, guarding end range, abd 98 degrees improved from 85 degrees at time of eval.                    Skin/wound care/debridement: Intact, incision axilla mobile, sensitive to touch.             Upper extremity compression: Assessed fit of compression garment pt obtained prior to visit to this clinic.  Note garment very easily donned, with minimal compression being achieved with wear of garment.  Pt reports she has worn it several times and has not laundered it since she received it.  Pt shown wear/care instructions, with nightly laundering recommended.  Pt instructed to follow manufacturer's instructions to launder garment nightly, bringing to next appt for therapist to assess fit.  Pt to wear as tolerated, donning first thing in the morning, wearing for 2 hours, and increasing wear by 1 hour/day up to 6 hours to determine tolerance as pt will require daily wear post XRT treatments which will begin in 2 weeks.  Pt verbalizes understanding of  instructions, and to call clinic with any questions or concerns.    Pt measured for second set of compression garments, with order sent to [facility] as this is where pt obtained current compression garment.                         Kinesiotaping: na   Girth/Volume measurement: See L UE girth measurements below.     TOTAL TREATMENT 79 mins     Circumferential Limb Measurements:  Affected Side: L UE    Left (cm) Right (  cm)   Base 3rd finger 6.3           Palm 18           Wrist 14.9           Mid Forearm 20.5           Elbow 22.3           Axilla 26.5           Length                   ASSESSMENT:   Treatment effectiveness and tolerance: Note current compression garment applying mild compression only, with pt reporting proximal aspect of garment becoming "tight" at the end of the day, with suspicion that L UE may be swelling as the day progresses, and garment not providing adequate containment of L UE edema at this time.  Also, pt at low end of fit parameters for this particular garment, with decision made to measure and fit garment that provides increased sizing options, thus promoting improved opportunity for fit of ready made compression garments.  Pt in agreement with plan. Axillary cording persists, with pain and limited ROM, however, do noted improved shoulder ROM post treatment, with pt c/o slightly less pain.     Progress toward goals: Assessed fit of compression sleeve with pt forgetting to bring hand piece.  Measured for second set of compression garments L UE to allow pt to have a set to wear and a set to wash.       PLAN OF CARE:   Changes to the plan of care: Continue treatment 1-2x/week as pt able to get to the clinic, as she lives a good distance from clinic.  Recommend pt launder compression garment then begin wear schedule noted above.  Pt to continue HEP as instructed within limits of pain.   Frequency: weekly   Other: Fit second set of compression garments upon receiving.       Theodoro Doing, PT, CLT

## 2013-03-13 ENCOUNTER — Inpatient Hospital Stay: Payer: BLUE CROSS/BLUE SHIELD

## 2013-03-13 MED ADMIN — bupivacaine-EPINEPHrine (PF) 0.5 %-1:200,000 30 mL, lidocaine-EPINEPHrine 1 %-1:100,000 20 mL solution: SUBCUTANEOUS | @ 14:00:00

## 2013-03-13 MED ADMIN — bupivacaine-EPINEPHrine (PF) 0.5 %-1:200,000 30 mL, lidocaine-EPINEPHrine 1 %-1:100,000 20 mL solution: SUBCUTANEOUS | @ 15:00:00 | NDC 63323046231

## 2013-03-13 NOTE — Op Note (Signed)
Name:      Emily [name], Emily [name]                                          Surgeon:        Binnie Rail,   MD  Account #: [id number]                 Surgery Date:   03/13/2013  DOB:       1951/08/12  Age:       62                           Location:                                 OPERATIVE REPORT      PREOPERATIVE DIAGNOSIS: History of carcinoma of the breast, status post  chemotherapy.    POSTOPERATIVE DIAGNOSIS: History of carcinoma of the breast, status post  chemotherapy.    PROCEDURES PERFORMED: Removal of venous Port-A-Cath.    SURGEON: Timoteo Expose. Valora Corporal., MD    ANESTHESIA:  Local.    SPECIMENS REMOVED: None.    ESTIMATED BLOOD LOSS: Minimal.    INDICATIONS FOR PROCEDURE: The patient is a 62 year old female who has  finished her chemotherapy and requires Port-A-Cath removal.    DESCRIPTION OF PROCEDURE: After sterile prep and drape and local anesthesia  with 1% lidocaine, the previous incision was opened. The Port-A-Cath was  identified. It was removed in its entirety, and it was intact. All  dissection planes were hemostatic.    The wound was anesthetized with 0.5% Marcaine, closed with an inner layer  of 3-0 Vicryl and running subcuticular 4-0 Monocryl on the skin.    The patient tolerated the procedure well without complications. She was  taken to the recovery room in stable condition.          Binnie Rail, MD    cc:   Binnie Rail, MD        JVP/wmx; D: 03/13/2013 10:55 A; T: 03/13/2013 11:16 A; Doc# [id number]; Job#  [id number]

## 2013-03-13 NOTE — H&P (Addendum)
HISTORY OF PRESENT ILLNESS  Emily [name] is a 62 y.o. female.  HPI ESTABLISHED patient here for post op visit s/p LEFT breast skin sparing mastectomy with LEFT ALND and tissue expander reconstruction. Patient has been having a lot of pain the past several days ever since the last saline injection on Wednesday, so this morning Dr. Imogene Burn took off 50cc and patient is feeling relief from that. She is using dilaudid for pain control. She starts chemo on Tuesday. She will be receiving TC x 4.  She had a screening ultrasound on 09/24/12 showing a 2.6 cm left LN. 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 HER 2 negative IHC at 0.   She then had a MRI breast on 10/05/12 showing the 2.8 x 2 cm LN as well as a left breast LIQ 0.9 cm x 1.1 cm x 0.6 cm mass. Biopsy of that mass by FNA on 10/16/12 shows a poorly differentiated carcinoma. These slides were air-dried, HER 2 not able to be performed on them.   Mammaprint of LN shows high risk luminal, ER + at 0.19, PR + at 0.61, HER 2 negative at -0.69.   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 Receptors pending.   ROS  Physical Exam   Pulmonary/Chest:  Heart  RRR   Lungs  Clear      ASSESSMENT and PLAN  Encounter Diagnoses    Name  Primary?    ???  Breast cancer, stage 2  Yes      Has completed chemotherapy.  Admit for port removal

## 2013-03-13 NOTE — Brief Op Note (Signed)
BRIEF OPERATIVE NOTE    Date of Procedure: 03/13/2013   Preoperative Diagnosis: BREAST CA  Postoperative Diagnosis: BREAST CA    Procedure(s):  PORT A CATH REMOVAL  Surgeon(s) and Role:     * Binnie Rail, MD - Primary  Anesthesia: Local   Estimated Blood Loss: minimal   Specimens: ID Type Source Tests Collected by Time Destination   1 : portacth discarded per dr Maxwell Marion    Binnie Rail, MD 03/13/2013 1022 Discarded      Findings: intact portacath   Complications: none  Implants: * No implants in log *

## 2013-03-13 NOTE — Op Note (Signed)
Name:      Desautel, Chardae P                                          Surgeon:        Taraoluwa Thakur V Pellicane, Jr,   MD  Account #: [id number]                 Surgery Date:   03/13/2013  DOB:       01/17/1951  Age:       61                           Location:                                 OPERATIVE REPORT      PREOPERATIVE DIAGNOSIS: History of carcinoma of the breast, status post  chemotherapy.    POSTOPERATIVE DIAGNOSIS: History of carcinoma of the breast, status post  chemotherapy.    PROCEDURES PERFORMED: Removal of venous Port-A-Cath.    SURGEON: Pheobe Sandiford V. Pellicane Jr., MD    ANESTHESIA:  Local.    SPECIMENS REMOVED: None.    ESTIMATED BLOOD LOSS: Minimal.    INDICATIONS FOR PROCEDURE: The patient is a 61-year-old female who has  finished her chemotherapy and requires Port-A-Cath removal.    DESCRIPTION OF PROCEDURE: After sterile prep and drape and local anesthesia  with 1% lidocaine, the previous incision was opened. The Port-A-Cath was  identified. It was removed in its entirety, and it was intact. All  dissection planes were hemostatic.    The wound was anesthetized with 0.5% Marcaine, closed with an inner layer  of 3-0 Vicryl and running subcuticular 4-0 Monocryl on the skin.    The patient tolerated the procedure well without complications. She was  taken to the recovery room in stable condition.          Lucendia Leard V Pellicane, Jr, MD    cc:   Jayleon Mcfarlane V Pellicane, Jr, MD        JVP/wmx; D: 03/13/2013 10:55 A; T: 03/13/2013 11:16 A; Doc# [id number]; Job#  [id number]

## 2013-03-18 NOTE — Progress Notes (Signed)
[facility]  Lymphedema Clinic and Cancer Rehabilitation  [address]  [address]  [place], [place]  [address]      LYmphedema Therapy  Visit: 3    [x]         Daily note               []        30 day/10th visit progress note    NAME: Emily [name]  DATE: 03/18/2013    GOALS   Short term goals   Time frame: 2-4 weeks   1. Instruct the patient to be independent with proper skin care to prevent future skin breakdown and decrease the potential risk for infections that are associated with Lymphedema.  2. Patient will be independent with a personal lymphedema exercise program to assist with the lymphatic flow and reduce proximal shoulder cap measurement L UE by 5mm.  3. Patient will understand the signs and symptoms of acute infection.  Long term goals   Time frame: 4-8 weeks   1. Patient will have knowledge of the compression options and acquire a safe and appropriate daytime and night time compression system to prevent re-accumulation of fluid.   2. Patient or family will be able to don/doff garments independently. Garment system effectiveness will be evaluated prior to discharge for better long-term management and outcomes.   3. Improvement in UEFS by 15-25 points allowing pt to perform at shoulder level activities with 75% less pain.   4. L shoulder ROM improved by 15-30 degrees at motions with 75% less pain at end range.   5. To transition patient to independent restorative phase of CDT.         SUBJECTIVE REPORT:  Patient states she will begin XRT this week, with availability for PT appts limited, however, prefers to continue with treatment at this clinic despite distance she has to travel to get here.  Pt continues with c/o pain as noted above.  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  Pt states she often walks with her son, holding his hand with her L hand, and he will spontaneously fall to the  ground, which causes further pain in extremity.   Pain:   Pain Scale 1: Numeric (0 - 10)  Pain Intensity 1: 5  Pain Location 1: Arm (axilla)  Gait:  indep  ADLs:  Performing mod I at this time within limits of ROM bilat shoulders.  Treatment Response:  Pt tolerated therapy well, with techniques performed within limits of pain.  Continued palpable cording noted, with improvement in ROM noted, slow progress, with pt available for limited appts.  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    Function: Pt able to don/doff compression sleeve in clinic today with ease.  Able to perform upper body dressing mod I with additional time and guarding L shoulder.  Began MLD (manual lymphatic drainage) techniques with lotioning and skin care and range of motion exercise routine without props.    TREATMENT AND OBJECTIVE DATA SUMMARY:   Patient/Family Education:      Educated in skin care: Reviewed skin care principles     ROM at time of eval L shoulder: Flex 85, Abd 85, ER 30,  IR 50       Instructed in self MLD:   Written sequence given and reviewed with patient as well as demonstration and instruction during MLD portion of the session.     Instructed in don/doff of compression system:   Multi layer bandage (MLB) donning principles and wear precautions. Remove compression if the following occur:  1.  Numbness/tingling increased from what you experience without compression.  2.  Compromise in blood flow, with poor capillary refill, or color changes to toes/fingers.  3.  Onset of pain different from what you may already experience.  4.  Unexplained shortness of breath.  5.  Signs/symptoms of infection including flu-like symptoms, fever, redness, warmth, pain.         Therapeutic Exercise/Procedure 12:35-12:53 pm   18 minutes                  Home program: Patient to perform daily to BID skin care, deep abdominal breathing, exercise routine performed with assist of therapist today:  Pect  stretch over small towel roll placed horizontally inferior edge of scapula, relaxing anterior upper quadrant attaining gentle pect stretch, holding for 30 seconds within limits of pain, performing x 1 in clinic, to continue 3-4x/day; scapular elevation/depression and protraction/retraction x 5 reps, with pt to perform slowly, within limits of pain, instructed to continue 2-3x/day.  Neural glide L shoulder at comfortable position into abduction, supine on bed with arm supported, adding wrist flex/ext to point of gentle, tolerable tension axilla, then relax, performing 10 reps 2-3x/day within limits of pain.     Rationale: Exercise will increase the lymph angiomotoricity and tissue pressure of the skin and thus decrease swelling.       Manual Therapy:  11:14-12:31 pm 77 minutes     Area to decongest: L UE/upper quadrant   Sequence used and effectiveness: Diaphragmatic breathing.  Activated celphalic vasovasorum.  Stim L supraclavicular lnn.  Full UE MLD sequence including medial to lateral technique upper arm. STM pect major/minor/upper trap/subscap, with trigger points noted throughout upper quadrant musculature with good response to treatment. Gentle manual glides axillary webbing, initiating at distal aspect of cords, moving proximally into axilla, with stretch/counter stretch technique, as well as perpendicular mobilization of cording into axilla.  Note small apparent seroma which was previously drained by Dr. Maxwell Marion, at initiation of treatment, which is less pronounced and tender to palpation at conclusion of treatment.   Gentle PROM L shoulder flex 94 degrees, guarding end range, abd 100 degrees improved from 85 degrees at time of eval.                    Skin/wound care/debridement: Intact, incision axilla mobile, sensitive to touch.             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  Light compression applied, with pt  instructed to remove bandaging in 2-3 days, and assess pain/tension in extremity to report results to therapist at next appt.  Stockinette  Cast padding  Comprilan 6, 8 cm      Pt reports current compression sleeve is not providing good compression, despite laundering garment nightly, with recently ordered compression garments to be fit at next appt.                      Kinesiotaping: na   Girth/Volume measurement: See L UE girth measurements below.     TOTAL TREATMENT 101  mins     ASSESSMENT:   Treatment effectiveness and tolerance: Note current compression garment applying mild compression only, with pt reporting proximal aspect of garment becoming "tight" at the end of the day, with suspicion that L UE may be swelling as the day progresses, and garment not providing adequate containment of L UE edema at this time.  Ordered CCL 1 Juzo compression sleeve/hand piece, which will be fit at next appt in clinic.  Pt in agreement with plan. Axillary cording persists, with pain and limited ROM, however, do noted improved shoulder ROM post treatment, with pt c/o slightly less pain.  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   Progress toward goals: Continued manual therapy.  Initiated short term compression bandaging to address AWS.       PLAN OF CARE:   Changes to the plan of care: Continue treatment 1-2x/week as pt able to get to the clinic, as she lives a good distance from clinic.  Recommend pt remove compression bandaging based on precautions above, or as needed for XRT on 03/20/2013.  Pt instructed that, if compression bandaging effectively managing pain, she can remain in bandaging for 3-4 days prior to removing, and laundering bandages for next appt.  Pt to continue HEP as instructed within limits of pain.   Frequency: 1-2x/week as schedule permits.   Other: Fit second set of compression garments next appt.  Assess response to bandaging.          Theodoro Doing, PT, CLT

## 2013-03-19 NOTE — Telephone Encounter (Signed)
Pt called and states that [employer] denied RT in [place] because "mapping was too close to heart."  Requesting another referral for XRT.  Spoke with patient to inform her of RT appointment with Dr Dan Humphreys 03/22/13 at 0900.  She said she would be able to make the appointment.

## 2013-03-20 NOTE — Telephone Encounter (Signed)
Patient L/M yesterday regarding a problem with her Rad Onc in [place].  Attempted to call patient back today.  L/M for her to call me back.

## 2013-03-28 NOTE — Telephone Encounter (Signed)
Pt called again. She would like to get the [facility] testing done prior to her visit with Dr Pershing Cox. Please call

## 2013-03-28 NOTE — Telephone Encounter (Signed)
Voicemail left for [facility] to call office back.

## 2013-03-28 NOTE — Telephone Encounter (Signed)
Pt called.  She wanted to discuss have the [facility] testing done. Please call [phone]

## 2013-03-29 NOTE — Telephone Encounter (Signed)
Are you ok with pt having this test done? She has no family history, so Lendon Collar may be the only one that works.

## 2013-03-29 NOTE — Telephone Encounter (Signed)
Called and left voicemail for pt to call office back to inform that Dr. Elroy Channel would like for pt to move up appointment to see [facility] sooner than 9/12 at 12:30pm. He would like for pt to discuss other genetic testing options with them as he feels that BRCA testing would not be effective for her.

## 2013-03-29 NOTE — Telephone Encounter (Signed)
Pt requesting call back before close today.

## 2013-03-29 NOTE — Telephone Encounter (Signed)
Spoke with pt and informed of Dr. Irvin suggestions and she states that she would like nurse to call and attempt to move [facility] appointment up. Informed pt that nurse would call and call back with appointment. Pt voiced understanding.

## 2013-03-29 NOTE — Telephone Encounter (Signed)
Spoke with [facility] and they are faxing note over to see what was discussed at pt's visit.

## 2013-03-29 NOTE — Telephone Encounter (Signed)
Spoke with pt and informed of Dr. Elroy Channel suggestions and she states that she would like nurse to call and attempt to move [facility] appointment up. Informed pt that nurse would call and call back with appointment. Pt voiced understanding.

## 2013-04-03 ENCOUNTER — Telehealth

## 2013-04-03 NOTE — Telephone Encounter (Signed)
Left voicemail for genetic department to call office back to reschedule appointment.

## 2013-04-03 NOTE — Progress Notes (Signed)
HISTORY OF PRESENT ILLNESS  Emily [name] is a 62 y.o. female.  HPI Established pt here for S/P porta cath removed 03/13/13. Pt states that she has "webbing under her left axillary." Pt also thinks that a seroma on her left breast has returned. Pt has questions regarding her CA and tx. Breast CA stage 2. S/P left mastectomy with implant reconstruction 11/28/12. Pt's last visit was 02/20/13 for left breast seroma, which 3cc of fluid was aspirated. Pt's pain level is 6/10 today of left breast.   ER+  PR+  HER2 neg    ROS    Physical Exam   Pulmonary/Chest:           ASSESSMENT and PLAN  Encounter Diagnoses   Name Primary?   ??? Breast cancer, stage 2, left Yes       Doing well, beginning XRT.  Will get to PT for webbing and see back in 6 months.

## 2013-04-03 NOTE — Telephone Encounter (Signed)
Left voicemail for genetic department to call office back to reschedule appointment.

## 2013-04-03 NOTE — Telephone Encounter (Signed)
Spoke with pt and informed that [facility] appointment has not been rescheduled yet and nurse would make appointment today. Pt voiced understanding. Pt states that she would like for Dr. Elroy Channel to inform her if she was positive for SGFR1 amplification, [id number], P13K inhibitor, and Kaplin-Meyer testing for her prognosis. She would also like explanations of these from Dr. Elroy Channel. Informed pt that nurse would speak with Dr. Elroy Channel and inform. Pt voiced understanding.

## 2013-04-03 NOTE — Telephone Encounter (Signed)
Pt called regarding rescheduling of her appointment at [facility]. Please call [phone]

## 2013-04-08 NOTE — Telephone Encounter (Signed)
Left voicemail to inform pt that [facility] genetic appointment has been rescheduled to 05/24/13 at 1:30pm. Waiting on return call.

## 2013-04-08 NOTE — Telephone Encounter (Signed)
Spoke with pt and informed of appointment and that Dr. Elroy Channel does not feel that the testing that she is asking about is necessary at this time and is usually done in the metastatic setting. Pt voiced understanding.

## 2013-04-08 NOTE — Telephone Encounter (Signed)
Left voicemail to inform pt that [facility] genetic appointment has been rescheduled to 05/24/13 at 1:30pm. Waiting on return call.

## 2013-04-08 NOTE — Telephone Encounter (Signed)
Spoke with pt and informed of appointment and that Dr. Irvin does not feel that the testing that she is asking about is necessary at this time and is usually done in the metastatic setting. Pt voiced understanding.

## 2013-04-15 NOTE — Communication Body (Signed)
HISTORY OF PRESENT ILLNESS  Emily [name] is a 62 y.o. female.  HPI Established pt here for S/P porta cath removed 03/13/13. Pt states that she has "webbing under her left axillary." Pt also thinks that a seroma on her left breast has returned. Pt has questions regarding her CA and tx. Breast CA stage 2. S/P left mastectomy with implant reconstruction 11/28/12. Pt's last visit was 02/20/13 for left breast seroma, which 3cc of fluid was aspirated. Pt's pain level is 6/10 today of left breast.   ER+  PR+  HER2 neg    ROS    Physical Exam   Pulmonary/Chest:           ASSESSMENT and PLAN  Encounter Diagnoses   Name Primary?   ??? Breast cancer, stage 2, left Yes       Doing well, beginning XRT.  Will get to PT for webbing and see back in 6 months.

## 2013-04-17 NOTE — Progress Notes (Signed)
[facility]  Lymphedema Clinic and Cancer Rehabilitation  [address]  [address]  [place], [place]  [address]      LYmphedema Therapy  Visit: 4    [x]         Daily note               []        30 day/10th visit progress note    NAME: Emily [name]  DATE: 04/17/2013    GOALS   Short term goals   Time frame: 2-4 weeks   1. Instruct the patient to be independent with proper skin care to prevent future skin breakdown and decrease the potential risk for infections that are associated with Lymphedema.  2. Patient will be independent with a personal lymphedema exercise program to assist with the lymphatic flow and reduce proximal shoulder cap measurement L UE by 5mm.  3. Patient will understand the signs and symptoms of acute infection.  Long term goals   Time frame: 4-8 weeks   1. Patient will have knowledge of the compression options and acquire a safe and appropriate daytime and night time compression system to prevent re-accumulation of fluid.   2. Patient or family will be able to don/doff garments independently. Garment system effectiveness will be evaluated prior to discharge for better long-term management and outcomes.   3. Improvement in UEFS by 15-25 points allowing pt to perform at shoulder level activities with 75% less pain.   4. L shoulder ROM improved by 15-30 degrees at motions with 75% less pain at end range.   5. To transition patient to independent restorative phase of CDT.         SUBJECTIVE REPORT:  Patient states she chose to undergo radiation therapy at this location, allowing her to have more consistent PT with this clinic.  Pt returns today after several weeks being unable to participate in treatment here, reporting continued limitation L shoulder ROM, pain persistent, decreased by 50% or more when UE at rest.  States she forgot to bring her compression sleeve/gauntlet for therapist to assess fit today.  Pt states she is unable to return  to clinic prior to next appt to allow therapist to assess fit of garment, but will bring to scheduled appt next week.  Pt states she and her son are staying in local hotel and her compression garment is at her home.     Pain:   Pain Scale 1: Numeric (0 - 10)  Pain Intensity 1: 8  Pain Location 1: Arm (axilla)  Gait:  indep  ADLs:  Performing mod I at this time within limits of ROM bilat shoulders.  Treatment Response:  Pt tolerated therapy fair/good with pain at end range shoulder flex/abd, with techniques performed within limits of pain.  Continued palpable cording noted, with improvement in ROM noted, slow progress, with pt unavailable for appts for the past several weeks.   Function: Pt reports indep in ADLS, working within limitations of L shoulder ROM and pain.   Began MLD (manual lymphatic drainage) techniques with lotioning and skin care and range of motion exercise routine without props.    TREATMENT AND OBJECTIVE DATA SUMMARY:   Patient/Family Education:      Educated in skin care: Reviewed skin care principles  ROM at time of eval L shoulder: Flex 85, Abd 85, ER 30, IR 50  04/17/2013 flex 88; abd 84.       Instructed in self MLD:   Written sequence given and reviewed with patient as well as demonstration and instruction during MLD portion of the session.     Instructed in don/doff of compression system:   Deferred with pt to bring compression garments to next scheduled appt for fit.        Therapeutic Exercise/Procedure 10:50-10:53am                     Home program: Patient to perform daily to BID skin care, deep abdominal breathing, exercise routine returned to gentle shoulder abd supported on mat, adding elbow extension/wrist extension within limits of pain, with pt to perform 3-4 x/day within limits of pain.  Plan to progress HEP, however, pt c/o pain warranted returning to conservative home ex program at this time.   Rationale: Exercise will increase the lymph angiomotoricity and tissue pressure of  the skin and thus decrease swelling.       Manual Therapy:  9:32-10:48 am 76 minutes     Area to decongest: L UE/upper quadrant   Sequence used and effectiveness: Diaphragmatic breathing.  Activated celphalic vasovasorum.  Stim L supraclavicular lnn.  Full UE MLD sequence including medial to lateral technique upper arm. STM multiple trigger points pect major/minor/upper trap/subscap/rhomboid musculature, with gentle technique required for tolerance at this time. Gentle manual glides axillary webbing, initiating at distal aspect of cords, moving proximally into axilla, with stretch/counter stretch technique, as well as perpendicular mobilization of cording into axilla.   Joint mob today limited to grade I distraction g-h joint.  Gentle PROM L shoulder flex 88 degrees, guarding end range, abd 84 degrees.                   Skin/wound care/debridement: Intact, incision axilla mobile, sensitive to touch.             Upper extremity compression: Pt to bring compression garments to next appt for therapist to fit and provide wear/care instructions.                       Kinesiotaping: na   Girth/Volume measurement: Repeat limb volumes bilat UE today due to extended time between treatments.  Note increase in limb volume discrepancy to 7.89%, with upper arm region of most concern at this time.  Pt would benefit from intermittent wear of compression garments, donning post XRT treatment, and wearing 2-4 hours a day as tolerated.       TOTAL TREATMENT 81 mins     ASSESSMENT:   Treatment effectiveness and tolerance: Pt returns to treatment today after being unable to schedule appts prior to this date, with several weeks since pt last seen.  Note L shoulder ROM measurements as at time of initial evaluation, with pain as noted above at end ROM.  Pt continues with heavy axillary cording, primarily palpable into breast, axilla, upper arm, however, pt reporting tension to thumb.  Note increase in limb volume L UE since last seens,  with intermittent wear of compression garments having potential of reducing limb volume, and assisting with resolution of AWS.  Conservative home exercise program resumed at this time, with plan to progress as tolerated in follow up treatments.   Progress toward goals: Continued manual therapy.  Limited home ex program resumed.     PLAN  OF CARE:   Changes to the plan of care: Continue treatment 1-2x/week as pt able to schedule.  Pt to bring compression garments to clinic for fit and wear/care instructions.  Pt to continue conservative HEP as instructed within limits of pain.   Frequency: 1-2x/week as schedule permits.   Other: Fit second set of compression garments next appt.         Theodoro Doing, PT, CLT

## 2013-04-22 ENCOUNTER — Encounter

## 2013-04-23 NOTE — Progress Notes (Signed)
_                                    [facility]  Lymphedema Clinic and Cancer Rehabilitation  [address]  [address]  [place], [place]  [address]      LYmphedema Therapy  Visit: 5    [x]         Daily note               []        30 day/10th visit progress note    NAME: Emily [name]  DATE: 04/23/2013    GOALS   Short term goals   Time frame: 2-4 weeks   1. Instruct the patient to be independent with proper skin care to prevent future skin breakdown and decrease the potential risk for infections that are associated with Lymphedema.  2. Patient will be independent with a personal lymphedema exercise program to assist with the lymphatic flow and reduce proximal shoulder cap measurement L UE by 5mm.  3. Patient will understand the signs and symptoms of acute infection.  Long term goals   Time frame: 4-8 weeks   1. Patient will have knowledge of the compression options and acquire a safe and appropriate daytime and night time compression system to prevent re-accumulation of fluid.  04/23/2013  Fit compression sleeve/gauntlet in clinic with good initial fit established.    2. Patient or family will be able to don/doff garments independently. Garment system effectiveness will be evaluated prior to discharge for better long-term management and outcomes.  04/23/2013  Pt able to don/doff compression sleeve/gauntlet indep in clinic today.  3. Improvement in UEFS by 15-25 points allowing pt to perform at shoulder level activities with 75% less pain.   4. L shoulder ROM improved by 15-30 degrees at motions with 75% less pain at end range.   5. To transition patient to independent restorative phase of CDT.         SUBJECTIVE REPORT:  Patient continuing with radiation therapy, stating she has developed a productive cough and skin irritation.  Pt reports she had a chest xray which was clear, and reports this as a side effect of radiation therapy.  Pt continues with shoulder pain bilat, L shoulder pain at 8/10  at most intense, at end range of shoulder flex/abd.  Pt states she continues to have assistance with her son, not lifting him due to shoulder pain.     Pain: 8/10 at most intense.  Discomfort when lying on R side.  Pain at rest low intensity to absent.           Gait:  indep  ADLs:  Performing mod I at this time within limits of ROM bilat shoulders.  Treatment Response:  Pt tolerated therapy well today with pain at end range shoulder flex/abd, with techniques performed within limits of pain.  Continued palpable cording noted,n however, less prominent, with improvement in ROM noted, slow progress, with pt unavailable for appts for the past several weeks.   Function: Pt reports indep in ADLS, working within limitations of L shoulder ROM and pain.   Began MLD (manual lymphatic drainage) techniques with lotioning and skin care and range of motion exercise routine without props.    TREATMENT AND OBJECTIVE DATA SUMMARY:   Patient/Family Education:      Educated in skin care: Reviewed skin care principles  ROM at time of eval L shoulder: Flex 85, Abd 85, ER 30, IR 50  04/23/2013 flex 95; abd 94.       Instructed in self MLD:   Written sequence given and reviewed with patient as well as demonstration and instruction during MLD portion of the session.     Instructed in don/doff of compression system:   Remove compression garments if the following occur:  1.  Numbness/tingling increased from what you experience without compression.  2.  Compromise in blood flow, with poor capillary refill, or color changes to toes/fingers.  3.  Onset of pain different from what you may already experience.  4.  Unexplained shortness of breath.  5.  Signs/symptoms of infection including flu-like symptoms, fever, redness, warmth, pain.    6.  Onset of hand swelling.       Therapeutic Exercise/Procedure am                     Home program: Patient to perform daily to BID skin care, deep abdominal breathing, exercise routine pt to continue  gentle shoulder abd supported on mat, adding elbow extension/wrist extension within limits of pain, with pt to perform 3-4 x/day within limits of pain.  Plan to progress HEP, however, pt c/o pain warranted returning to conservative home ex program at this time.   Rationale: Exercise will increase the lymph angiomotoricity and tissue pressure of the skin and thus decrease swelling.       Manual Therapy:  9:38-10:35 am 57 minutes   Orthotic management:  10:36-10:46 am       10 minutes  Area to decongest: L UE/upper quadrant   Sequence used and effectiveness: Diaphragmatic breathing.  Stim L inguinal/R axillary lnn.  Activated PAA/AI, avoiding AAA secondary to mild skin irritation noted. Activated celphalic vasovasorum.  Stim L supraclavicular lnn.  Full UE MLD sequence including medial to lateral technique upper arm. STM multiple trigger points pect major/minor/upper trap/subscap/rhomboid musculature, with gentle technique required for tolerance at this time. Gentle manual glides axillary webbing, initiating at distal aspect of cords, moving proximally into axilla, with stretch/counter stretch technique, as well as perpendicular mobilization of cording into axilla.   Gentle PROM L shoulder flex 95 degrees, guarding end range, abd 94 degrees.                   Skin/wound care/debridement: Intact, incision axilla mobile, sensitive to touch.             Upper extremity compression: Pt brought CCL 1 compression sleeve/gauntlet to appt today for fit.  Note landmark measurements within parameters for Juzo size I regular sleeve and size 1-S gauntlet.  Therapist fit garments in clinic today with good fit noted.  Pt instructed in appropriate don/doff of compression garments, and wear/care of garments. Pt instructed in wear schedule, including initiating with 2-4 hours today post XRT, and reporting tolerance to garment.  Pt instructed to remove garments based on the above precautions, which were all reviewed with pt.  Pt  instructed to launder garments nightly, drying in dryer when available, with further discussion about wear schedule to continue.                        Kinesiotaping: na   Girth/Volume measurement: See girth measurements below.     TOTAL TREATMENT 68 mins   Affected Side: Left    Left (cm) Right (cm)   Base 3rd finger 6.3  Palm 18.5           Wrist 15           Mid Forearm 20.5           Elbow 22.7           Axilla 26           Length                 ASSESSMENT:   Treatment effectiveness and tolerance: Pt returns to treatment today stating she is noting improvement in axillary cording at this time.  Note L shoulder ROM measurements improved, with pain as noted above at end ROM.  Pt continues with axillary cording, improved today, primarily palpable into breast, axilla, upper arm, however, pt reporting tension to thumb.  Note good fit of compression sleeve, gauntlet, with good understanding of wear/care schedule of garments. Conservative home exercise program continued at this time, with plan to progress as tolerated in follow up treatments.   Progress toward goals: Continued manual therapy.  See updated goals.     PLAN OF CARE:   Changes to the plan of care: Continue treatment 1-2x/week as pt able to schedule.  Assess response to wear of compression garments.  Pt to continue conservative HEP as instructed within limits of pain.  Progress HEP as pt tolerates.   Frequency: 1-2x/week as schedule permits.   Other: Monitor compression garments.       Theodoro Doing, PT, CLT

## 2013-04-24 NOTE — Progress Notes (Signed)
[facility]  Lymphedema Clinic and Cancer Rehabilitation  [address]  [address]  [place], [place]  [address]      LYmphedema Therapy  Visit: 6    [x]         Daily note               []        30 day/10th visit progress note    NAME: Emily [name]  DATE: 04/23/2013    GOALS   Short term goals   Time frame: 2-4 weeks   1. Instruct the patient to be independent with proper skin care to prevent future skin breakdown and decrease the potential risk for infections that are associated with Lymphedema.  2. Patient will be independent with a personal lymphedema exercise program to assist with the lymphatic flow and reduce proximal shoulder cap measurement L UE by 5mm.  3. Patient will understand the signs and symptoms of acute infection.  Long term goals   Time frame: 4-8 weeks   1. Patient will have knowledge of the compression options and acquire a safe and appropriate daytime and night time compression system to prevent re-accumulation of fluid.  04/23/2013  Fit compression sleeve/gauntlet in clinic with good initial fit established.    2. Patient or family will be able to don/doff garments independently. Garment system effectiveness will be evaluated prior to discharge for better long-term management and outcomes.  04/23/2013  Pt able to don/doff compression sleeve/gauntlet indep in clinic today.  3. Improvement in UEFS by 15-25 points allowing pt to perform at shoulder level activities with 75% less pain.   4. L shoulder ROM improved by 15-30 degrees at motions with 75% less pain at end range.   5. To transition patient to independent restorative phase of CDT.         SUBJECTIVE REPORT:  Patient continuing with radiation therapy.  States she was able to wear her compression garment during and after radiation therapy yesterday, removing at 3:00pm.  States sleeve was comfortable, and "felt good on my arm."  Pain persists as previously, with pt c/o general fatigue  since XRT initiated.   Pain: 8/10 at most intense.  Discomfort when lying on R side.  Pain at rest low intensity to absent.           Gait:  indep  ADLs:  Performing mod I at this time within limits of ROM bilat shoulders.  Treatment Response:  Pt tolerated therapy well today with significant improvement in shoulder flex L and AWS.  Good tolerance to compression garment wear.  Function: Pt reports indep in ADLS, working within limitations of L shoulder ROM and pain.   Began MLD (manual lymphatic drainage) techniques with lotioning and skin care and range of motion exercise routine without props.    TREATMENT AND OBJECTIVE DATA SUMMARY:   Patient/Family Education:      Educated in skin care: Reviewed skin care principles     ROM at time of eval L shoulder: Flex 85, Abd 85, ER 30, IR 50  04/24/2013 flex 120; abd 94.       Instructed in self MLD:   Written sequence given and reviewed with patient as well as demonstration and instruction during MLD portion of the session.  Instructed in don/doff of compression system:   Remove compression garments if the following occur:  1.  Numbness/tingling increased from what you experience without compression.  2.  Compromise in blood flow, with poor capillary refill, or color changes to toes/fingers.  3.  Onset of pain different from what you may already experience.  4.  Unexplained shortness of breath.  5.  Signs/symptoms of infection including flu-like symptoms, fever, redness, warmth, pain.    6.  Onset of hand swelling.       Therapeutic Exercise/Procedure am                     Home program: Patient to perform daily to BID skin care, deep abdominal breathing, exercise routine pt to continue gentle shoulder abd supported on mat, adding elbow extension/wrist extension within limits of pain, with pt to perform 3-4 x/day within limits of pain.  Plan to progress HEP, however, pt c/o pain warranted returning to conservative home ex program at this time.   Rationale: Exercise will  increase the lymph angiomotoricity and tissue pressure of the skin and thus decrease swelling.       Manual Therapy:  9:49-10:45 am 56 minutes     Area to decongest: L UE/upper quadrant   Sequence used and effectiveness: Diaphragmatic breathing.  Stim L inguinal/R axillary lnn.  Activated PAA/AI, avoiding AAA secondary to mild skin irritation noted. Activated celphalic vasovasorum.  Stim L supraclavicular lnn.  Full UE MLD sequence including medial to lateral technique upper arm. STM multiple trigger points pect major/minor/upper trap/subscap/rhomboid musculature, with gentle technique required for tolerance at this time. Gentle manual glides axillary webbing, working primarily in axilla and proximal upper arm with stretch/counter stretch technique, as well as perpendicular mobilization of cording into axilla.   Gentle PROM L shoulder flex 120 degrees, guarding end range, abd 94 degrees.                   Skin/wound care/debridement: Intact, incision axilla mobile, decreased hypersensitivity with palpation.             Upper extremity compression: Note good continued fit of compression garments with no c/o at this time.  Pt instructed to add 1-2 hours to wear schedule, continuing with 4-6 hour wear/day until next appt as tolerated.  Pt instructed to notify clinic of any questions or concerns regarding garment prior to next scheduled appt.                       Kinesiotaping: na   Girth/Volume measurement: Deferred     TOTAL TREATMENT 57 mins     ASSESSMENT:   Treatment effectiveness and tolerance: Pt returns to treatment today stating she is noting continued improvement in axillary cording at this time.  Note L shoulder ROM measurements improved, with pain as noted above at end ROM.  Pt surprised and pleased regarding improvement in shoulder flexion today.  Pt continues with axillary cording, improved today, primarily palpable into breast, axilla, upper arm, however, pt continues with reporting intermittent tension  to thumb.  Note continued good fit of compression sleeve, gauntlet, with good understanding of wear/care schedule of garments. Conservative home exercise program continued at this time, with plan to progress as tolerated in follow up treatments.  Discussed that we will consider progressing HEP next week based on pain levels reported.   Progress toward goals: Continued manual therapy.  See updated goals.     PLAN OF CARE:  Changes to the plan of care: Continue treatment 1-2x/week as pt able to schedule.  Assess response to wear of compression garments increasing wear time as tolerated.  Pt to continue conservative HEP as instructed within limits of pain.  Progress HEP as pt tolerates.   Frequency: 1-2x/week as schedule permits.   Other: Monitor compression garments.       Theodoro Doing, PT, CLT

## 2013-05-01 NOTE — Progress Notes (Signed)
[facility]  Lymphedema Clinic and [facility]  [address]  [address]  [place], [place]  [address]      LYmphedema Therapy  Visit: 7    [x]         Daily note               []        30 day/10th visit progress note    NAME: Emily [name]  DATE: 05/01/2013    GOALS   Short term goals   Time frame: 2-4 weeks   1. Instruct the patient to be independent with proper skin care to prevent future skin breakdown and decrease the potential risk for infections that are associated with Lymphedema.  2. Patient will be independent with a personal lymphedema exercise program to assist with the lymphatic flow and reduce proximal shoulder cap measurement L UE by 5mm.  3. Patient will understand the signs and symptoms of acute infection.  Long term goals   Time frame: 4-8 weeks   1. Patient will have knowledge of the compression options and acquire a safe and appropriate daytime and night time compression system to prevent re-accumulation of fluid.  04/23/2013  Fit compression sleeve/gauntlet in clinic with good initial fit established.    2. Patient or family will be able to don/doff garments independently. Garment system effectiveness will be evaluated prior to discharge for better long-term management and outcomes.  04/23/2013  Pt able to don/doff compression sleeve/gauntlet indep in clinic today.  3. Improvement in UEFS by 15-25 points allowing pt to perform at shoulder level activities with 75% less pain.   4. L shoulder ROM improved by 15-30 degrees at motions with 75% less pain at end range.   5. To transition patient to independent restorative phase of CDT.         SUBJECTIVE REPORT:  Patient continuing with radiation therapy, stating she is continuing with skin irritation, and is to see Dr. Dan Humphreys today.  States she has continued with wear of compression garment L UE with good result. Pain persists as previously, with pt c/o general fatigue since XRT  initiated.   Pain: 8/10 at most intense.  Discomfort when lying on R side.  Pain at rest low intensity to absent.  Region L axilla/posterior upper arm.  New pain radiation field with skin irritation and redness noted.             Gait:  indep  ADLs:  Performing mod I at this time within limits of ROM bilat shoulders.  Treatment Response: Noted increased muscle guarding today with pt reporting increased pain radiation field.  Good tolerance to compression garment wear.  Function: Pt reports indep in ADLS, working within limitations of L shoulder ROM and pain.   Began MLD (manual lymphatic drainage) techniques with lotioning and skin care and range of motion exercise routine without props.    TREATMENT AND OBJECTIVE DATA SUMMARY:   Patient/Family Education:      Educated in skin care: Reviewed skin care principles     ROM at time of eval L shoulder: Flex 85, Abd 85, ER 30, IR 50  05/01/2013 flex 110; abd 98.       Instructed in self MLD:   Written sequence given and reviewed with  patient as well as demonstration and instruction during MLD portion of the session.     Instructed in don/doff of compression system:   Remove compression garments if the following occur:  1.  Numbness/tingling increased from what you experience without compression.  2.  Compromise in blood flow, with poor capillary refill, or color changes to toes/fingers.  3.  Onset of pain different from what you may already experience.  4.  Unexplained shortness of breath.  5.  Signs/symptoms of infection including flu-like symptoms, fever, redness, warmth, pain.    6.  Onset of hand swelling.       Therapeutic Exercise/Procedure:  10:35-11:44  9 minutes                   Home program: Patient to perform daily to BID skin care, deep abdominal breathing.  The following exercises completed today with assist of therapist:  gentle shoulder abd supported on mat, adding elbow extension/wrist extension x 10 reps; Pect stretch in supine over towel roll placed  inferior scap tolerating 1 minute well; active scapular adduction in sitting, with verbal cues to perform scapular depression bilat, then performed scap add x 10 reps, slow and deliberate within limits of pain.  Pt instructed to continue these three exercises 2x/day, within limits of pain.  Plan to progress HEP, however, pt c/o pain warranted  conservative home ex program at this time.   Rationale: Exercise will increase the lymph angiomotoricity and tissue pressure of the skin and thus decrease swelling.       Manual Therapy:  9:32-10:33 am 61 minutes     Area to decongest: L UE/upper quadrant   Sequence used and effectiveness: Diaphragmatic breathing.  Trunk sequence held secondary to skin redness/rash like irritation noted anterior/posterior upper quadrant and axilla. Activated celphalic vasovasorum.  Stim L supraclavicular lnn.  Full UE MLD sequence including medial to lateral technique upper arm. STM multiple trigger points pect major/minor/upper trap/subscap musculature, with gentle technique required for tolerance at this time. Gentle manual glides axillary webbing, working primarily in axilla and proximal upper arm with stretch/counter stretch technique, as well as perpendicular mobilization of cording into axilla. Noted remnants of previous seroma, however, pt no longer sensitive to touch in specific area.    G-h joint mobs, grade I distraction paired with caudal/a-p glides.  Significant muscle guarding noted pect major/minor, responsive to manual therapy.  Gentle PROM L shoulder flex 110 degrees today, guarding end range, abd 98 degrees.                   Skin/wound care/debridement: Intact, incision axilla mobile, redness/rash like appearance with radiation therapy.               Upper extremity compression: Note good continued fit of compression garments with no c/o at this time.  Pt instructed in proper donning technique today, as well as placement of proximal aspect of garment, continuing with 4-6 hour  wear/day post radiation treatment.                       Kinesiotaping: na   Girth/Volume measurement: Deferred     TOTAL TREATMENT 74 mins     ASSESSMENT:   Treatment effectiveness and tolerance: Pt returns to treatment today stating she is "tighter" today, and experiencing significant pain/irritation skin with XRT.  Pt reports she is to see Dr. 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  Note L shoulder ROM measurements abd improved, slight reduction shoulder flex, with pain as noted above at end ROM.   Pt continues with axillary cording, improved today, primarily palpable into breast, axilla, upper arm, however, pt continues with reporting intermittent tension to thumb. Note muscle guarding pect/lat/upper trap in response to pain. Note continued good fit of compression sleeve, gauntlet, requiring further instruction regarding donning technique and appropriate placement proximal aspect. Conservative home exercise program continued at this time, with plan as noted above.  Discussed that we will consider progressing HEP based on pain levels reported. Neuropathic pain a possibility?  Pt to consider and will address with Dr. Elroy Channel at next scheduled appt.   Progress toward goals: Continued manual therapy.  See updated goals.     PLAN OF CARE:   Changes to the plan of care: Continue treatment 1-2x/week as pt able to schedule.  Assess response to wear of compression garments increasing wear time as tolerated. Assess response to conservative additions to HEP.  Progress HEP as pt tolerates.   Frequency: 1-2x/week as schedule permits.   Other: Monitor compression garments.       Theodoro Doing, PT, CLT

## 2013-05-06 NOTE — Progress Notes (Signed)
[facility]  L[facility]hedema Clinic and [facility]  [address]  [address]  [place], [place]  [address]      LYmphedema Therapy  Visit: 8    [x]         Daily note               []        30 day/10th visit progress note    NAME: Emily [name]  DATE: 05/06/2013    GOALS   Short term goals   Time frame: 2-4 weeks   1. Instruct the patient to be independent with proper skin care to prevent future skin breakdown and decrease the potential risk for infections that are associated with Lymphedema.  2. Patient will be independent with a personal lymphedema exercise program to assist with the lymphatic flow and reduce proximal shoulder cap measurement L UE by 5mm.  3. Patient will understand the signs and symptoms of acute infection.  Long term goals   Time frame: 4-8 weeks   1. Patient will have knowledge of the compression options and acquire a safe and appropriate daytime and night time compression system to prevent re-accumulation of fluid.  04/23/2013  Fit compression sleeve/gauntlet in clinic with good initial fit established.    2. Patient or family will be able to don/doff garments independently. Garment system effectiveness will be evaluated prior to discharge for better long-term management and outcomes.  04/23/2013  Pt able to don/doff compression sleeve/gauntlet indep in clinic today.  3. Improvement in UEFS by 15-25 points allowing pt to perform at shoulder level activities with 75% less pain.   4. L shoulder ROM improved by 15-30 degrees at motions with 75% less pain at end range.   5. To transition patient to independent restorative phase of CDT.         SUBJECTIVE REPORT:  Patient continuing with radiation therapy, which will conclude this week.  States she has continued with wear of compression garment L UE with good result. Pain persists as previously, with pt c/o general fatigue since XRT initiated.  Pt reports lateral upper quadrant discomfort,  combination of skin sensitivity post radiation.   Pain: 8/10 at most intense.  Discomfort when lying on R side.  Pain at rest low intensity to absent.  Region L axilla/posterior upper arm.  New pain radiation field with skin irritation and redness noted.             Gait:  indep  ADLs:  Performing mod I at this time within limits of ROM bilat shoulders.  Treatment Response: Noted decreased visible axillary cording today.  Good tolerance to compression garment wear.  Function: Pt reports indep in ADLS, working within limitations of L shoulder ROM and pain.   Began MLD (manual lymphatic drainage) techniques with lotioning and skin care and range of motion exercise routine without props.    TREATMENT AND OBJECTIVE DATA SUMMARY:   Patient/Family Education:      Educated in skin care: Reviewed skin care principles     ROM at time of eval L shoulder: Flex 85, Abd 85, ER 30, IR 50  05/06/2013 flex 128; abd 110 at conclusion of treatment with slow, gentle stretch to attain optimal ROM.       Instructed in  self MLD:   Written sequence given and reviewed with patient as well as demonstration and instruction during MLD portion of the session.     Instructed in don/doff of compression system:   Remove compression garments if the following occur:  1.  Numbness/tingling increased from what you experience without compression.  2.  Compromise in blood flow, with poor capillary refill, or color changes to toes/fingers.  3.  Onset of pain different from what you may already experience.  4.  Unexplained shortness of breath.  5.  Signs/symptoms of infection including flu-like symptoms, fever, redness, warmth, pain.    6.  Onset of hand swelling.       Therapeutic Exercise/Procedure:  12:32-12:41pm  9 minutes                   Home program: Patient to perform daily to BID skin care, deep abdominal breathing.  The following exercises completed today with assist of therapist:  gentle shoulder abd supported on mat, adding elbow  extension/wrist extension x 10 reps; Pect stretch in supine over towel roll placed inferior scap tolerating 1 minute well x 2 with towel roll placed vertically between scapulae,  then horizontally base of scapulae ;active external rotation x 10 in side lying L UE/R UE with pt instructed to continue program at home including scapular depression bilat, paired with scap add x 10 reps, slow and deliberate within limits of pain.  Pt instructed to continue  all exercises 2x/day, within limits of pain.     Rationale: Exercise will increase the lymph angiomotoricity and tissue pressure of the skin and thus decrease swelling.       Manual Therapy:  11:30-12:31 am 61 minutes     Area to decongest: L UE/upper quadrant   Sequence used and effectiveness: Diaphragmatic breathing.  Trunk sequence held secondary to skin redness/rash like irritation noted anterior/posterior upper quadrant and axilla. Activated celphalic vasovasorum.  Stim L supraclavicular lnn.  Full UE MLD sequence including medial to lateral technique upper arm. STM multiple trigger points pect major/minor/upper trap/subscap musculature, with gentle technique required for tolerance at this time. Gentle manual glides axillary webbing, working primarily in axilla and proximal upper arm with stretch/counter stretch technique, as well as perpendicular mobilization of cording into axilla. Noted remnants of previous seroma.  Gentle techniques used throughout treatment avoiding radiation field due to skin changes.    G-h joint mobs, grade I distraction paired with grade I caudal/a-p glides.  Significant muscle guarding persists pect major/minor, responsive to manual therapy.  Gentle PROM L shoulder flex 128 degrees today, guarding end range, abd 110 degrees.                   Skin/wound care/debridement: Intact, incision axilla mobile, redness/rash like appearance with radiation therapy anterior/posterior upper quadrant and axilla.               Upper extremity  compression: Note good continued fit of compression garments with no c/o at this time.  Order placed for second set of garments to allow pt to have a set to wear and a set to wash.                       Kinesiotaping: na   Girth/Volume measurement: Deferred     TOTAL TREATMENT 71 mins     ASSESSMENT:   Treatment effectiveness and tolerance: Pt returns to treatment today stating she is "tighter" today, and experiencing significant pain/irritation skin  with XRT, however, therapist notes improvement in L shoulder ROM.  Pt continues with axillary cording, improved today, with ROM compromised by cording as well as joint dysfunction. Note muscle guarding pect/lat/upper trap in response to pain. Note continued good fit of compression sleeve, gauntlet, requiring further instruction regarding donning technique and appropriate placement proximal aspect. Conservative home exercise program continued at this time, with plan as noted above.  Discussed that we will consider progressing HEP based on pain levels reported. Neuropathic pain a possibility?  Pt to consider and will address with Dr. Elroy Channel at next scheduled appt.   Progress toward goals: Continued manual therapy.  See updated goals.     PLAN OF CARE:   Changes to the plan of care: Continue treatment 1-2x/week as pt able to schedule.  Assess response to continued wear of compression garments increasing wear time as tolerated. Assess response to conservative additions to HEP.  Progress HEP as pt tolerates.   Frequency: 1-2x/week as schedule permits.   Other: Monitor compression garments.       Theodoro Doing, PT, CLT

## 2013-05-07 NOTE — Progress Notes (Signed)
Medical records request faxed to [facility] [phone]..request sent to scanning

## 2013-05-09 NOTE — Telephone Encounter (Signed)
Returned pts phone call. Message left for her to call office.    2:56 pm : Pt returned call she is c/o of a fever blister on her lip that is sore and bleeding. She would like a Rx for valtrex. Will inform Dr.Irvin.     3:03 pm : Spoke with Dr. Elroy Channel and prescription will be phoned in.    3:13 pm : Per verbal order, prescription reordered and sent to [facility] [address] [facility]. Pt notified of prescription sent to pharmacy. Pt also stated that she saw her endocrinologist and had a "full profile drawn" and will bring in for Dr.Irvin.

## 2013-05-09 NOTE — Telephone Encounter (Signed)
Pt called and would like an rx for valtrex sent in to [facility] [address]. She has  Fever blister from radiation ,

## 2013-05-15 NOTE — Telephone Encounter (Signed)
error 

## 2013-05-22 NOTE — Telephone Encounter (Signed)
Pt called.  She wanted to know if her insurance will cover the visit to the geneticist. She also needs an order for another compression sleeve faxed [facility]. Her last sleeve was too large. Please call [phone]

## 2013-05-22 NOTE — Progress Notes (Signed)
[facility]  [address]  [address]  [place], [place]  [address]      INITIAL EVALUATION    NAME: Emily [name] AGE: 62 y.o.  GENDER: female  DATE: 05/22/2013  REFERRING PHYSICIAN: Orlin Hilding, MD   HISTORY AND BACKGROUND:  Pt is a 62 y/o female found to have 2.6 cm LN L axilla 09/24/2012.  Underwent biopsy on 10/02/2012 with findings of metastatic neoplasm.  MRI on 10/05/2012 revealed L breast mass in addition to lymph node.  Biopsy of breast mass on 10/16/2012 showed poorly differentiated carcinoma.  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  Pt received care to address L UE lymphedema at [facility], and comes to this clinic for a second opinion per order from Dr. Elroy Channel.  Pt presented at initial eval with pain L axilla, reporting "fluid pocket" in region, severe pain with shoulder movement, and feeling of "fullness" L upper arm. Pt states symptoms presented post surgery.  Pt also reports bilat shoulder dysfunction prior to cancer diagnosis, having undergone R rotator cuff repair 7 years ago, L rotator cuff repair 3 years ago.     Pt returns to treatment today 2 weeks post completion of XRT, with significant skin changes noted L axilla/anterior chest wall, with peeling noted, skin intact overall, pt c/o itching.  Pt states she has been using Xeroform over radiated area with good results of pain/itch relief.  Pt reports she continues with limited use of L UE with shoulder ROM limited, stating she is "back where she started" with reaction to radiation.   Primary Diagnosis:  ?? 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 Malignant neoplasm of breast (174.0)  ?? Pain in joint (719.41)   Date of Onset: 11/29/2011  Present Symptoms and Functional Limitations: Severe pain L axilla/upper arm/upper quadrant anterior/lateral aspect.    Shoulder Functional Scale: 21/80 indicating 60% impairment  Past Medical History:   Past Medical History   Diagnosis Date   ??? Cancer      left breast  cancer   ??? Anxiety    ??? Arthritis    ??? Nausea & vomiting    ??? Concussion 2011   ??? Chronic tension headaches    ??? Headache      OCCAS   ??? Other ill-defined conditions      PHEOCHROMOCYTOMA   ??? Other and unspecified symptoms and signs involving general sensations and perceptions      HIVES RECENTLY- WAS IN ER Feb 19, 2013   ??? Lymphedema of arm      left     Past Surgical History   Procedure Laterality Date   ??? Hx orthopaedic       bilateral shoulder arthroscopy   ??? Hx modified radical mastectomy  11/28/2012     LEFT BREAST MODIFIED RADICAL MASTECTOMY WITH AXILLARY DISSECTION W/ RECONSTRUCTION LEFT BREAST WITH TISSUE EXPANDER AND ALLODERM;PORTACATH INSERTION performed by Binnie Rail, MD at [facility] AMBULATORY OR   ??? Hx gyn       c-section   ??? Hx oophorectomy       right   ??? Hx vascular access     ??? Pr breast surgery procedure unlisted  '91     breast implants;  replaced bilat implants 2011   ??? Hx breast reconstruction  11/28/2012     BREAST RECONSTRUCTION /C  INSERTION EXPANDER & ALLODERM performed by Ivar Bury, MD at [facility] AMBULATORY OR   ??? Hx heent       T&A   ??? Hx heent       right adrenal removed   ??? Pr abdomen surgery proc unlisted       hernia mesh repair   ??? Hx appendectomy     ??? Pr abdomen surgery proc unlisted       right adenal removed     Current Medications:    Current Outpatient Prescriptions   Medication Sig   ??? valACYclovir (VALTREX) 500 mg tablet Take 1 tab by mouth two (2) times a day for 3 days then 500mg  once daily.   ??? clonazePAM (KLONOPIN) 0.5 mg tablet Take 1 Tab by mouth two (2) times a day.   ??? methylPREDNISolone (MEDROL DOSEPACK) 4 mg tablet Per dose pack instructions   ??? desloratadine (CLARINEX) 5  mg tablet Take 5 mg by mouth daily.   ??? diphenhydrAMINE (BENADRYL) 50 mg tablet Take 1 Tab by mouth nightly as needed for Itching.   ??? citalopram (CELEXA) 20 mg tablet Take 1 Tab by mouth daily.   ??? HYDROmorphone (DILAUDID) 2 mg tablet Take 2 Tabs by mouth every four (4) hours as needed (every 3-4 hours).   ??? anastrozole (ARIMIDEX) 1 mg tablet Take 1 Tab by mouth daily.   ??? oxyCODONE-acetaminophen (PERCOCET) 5-325 mg per tablet Take 1 Tab by mouth every four (4) hours as needed for Pain.   ??? OTHER cranial prosthesis   ??? Granisetron (SANCUSO) 3.1 mg/24 hour ptwk Apply one patch to upper arm 24 hours prior to chemotherapy and leave in place for 7 days.   ??? codeine-butalbital-acetaminophen-caffeine (FIORICET WITH CODEINE) 50-325-40-30 mg per capsule Take 1 Cap by mouth every four (4) hours as needed for Headache.   ??? magic mouthwash solution Magic mouth wash   Maalox  Lidocaine 2% viscous   Diphenhydramine oral solution   Pharmacy to mix equal portions of ingredients to a total volume as indicated in the dispense amount.  Swish and spit 15-45ml Q 4 hours as needed for mouth pain. May swallow for throat pain.   ??? promethazine (PHENERGAN) 12.5 mg tablet Take 1 Tab by mouth every six (6) hours as needed for Nausea.   ??? DOCUSATE SODIUM (COLACE PO) Take 1 Tab by mouth daily.   ??? dexamethasone (DECADRON) 4 mg tablet Take 2 Tabs by mouth daily. 8 mg twice a day the day before and day after chemo   ??? lidocaine-prilocaine (EMLA) topical cream Apply  to affected area as needed for Pain.   ??? ondansetron hcl (ZOFRAN) 8 mg tablet Take 1 Tab by mouth every eight (8) hours as needed for Nausea.     No current facility-administered medications for this encounter.     Allergies:   Allergies   Allergen Reactions   ??? Aspirin Unknown (comments)     Gi bleeding   ??? Compazine [Prochlorperazine] Rash   ??? Keflex [Cephalexin] Rash   ??? Levaquin [Levofloxacin] Hives   ??? Nsaids (Non-Steroidal Anti-Inflammatory Drug) Swelling     Throat swells   ???  Prednisone Rash      Social/Work History and Prior Level of Function:  Pt reports continuing intermittent work schedule.  Pt lives in [place].   Living Situation: Pt lives near her work place, which is  a good distance from this clinic, probably an hour one way.      Trainable Caregiver?: Pt's significant other is an ob-gyn, interested in patient's condition.     Self-care/ADLs: performing mod I, additional time, UE dressing deliberately to avoid pain L UE.     Mobility: indep transfers, bed mobility, gait in clinic.   Sleeping Arrangement:  Bed.   Adaptive Equipment Owned: na  Previous Therapy:  Pt received therapy for lymphedema management at [facility]. 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  Pt has been inconsistent with attending appts throughout treatment secondary to distance pt lives from clinic, with pt also stating she "needed a break" from treatment for a couple of weeks after XRT to allow her skin to heal.  Dr. Leonia Corona concurred and notified clinic that pt would return in treatment in 2 weeks post conclusion of XRT, which is occurring today.  Compression/Lymphedema Equipment:  Juzo 2001 compression sleeve/gauntlet, one set, with second set received in clinic today for fit.    SUBJECTIVE:   Pt reports pain L UE/axilla severed in nature, stating she tries to make it through the day without pain medication, however, generally has to take a Percocet late afternoon, and then again at bedtime to manage pain.  Pt reports she performs limited UE activities secondary to pain, avoiding lifting, even light loads.  Pt reports frustration with work limitations caused by pain at this time.  Pt states she received pump trial with pump she is trying to obtain, stating, "I felt the fluid just leave when I was in the pump."  Pt expressed concern regarding lymphedema as XRT begins.  Pt states R shoulder now painful which she  attributes to overusing extremity as she is limiting use of L UE. Pt reports significant side effects from chemotherapy.  Patient???s goals for therapy: decrease pain L UE.  Relief of "fluid" which she identifies in upper quadrant/axilla/upper arm.    EVALUATION AND OBJECTIVE DATA SUMMARY:   Pain:  Pain Scale 1: Numeric (0 - 10)  Pain Intensity 1: 6  Pain Location 1: Arm;Other (comment) (Axilla)  Skin and Tissue Assessment:  Dermal Status:  ( x)  Intact ( )  Dry   ( x)  Tenuous: Note redness and superficial peeling of skin throughout radiation field anterior chest wall and axilla.  Skin appearing intact under flaky, peeling skin.  Pt is 2 weeks post completion of XRT. ( )  Flaky   ( )  Wound/lesion present ( x)  Scars:  Incision L breast/axilla intact, min tension noted.     ( )  Dermatitis    Texture/Consistency:  (x )  Boggy:  Proximal aspect of L UE, primarily in shoulder cap region.  ( )  Pitting Edema   ( )  Brawny ( )  Combination   ( )  Fibrotic/Woody    Pigmentation/Color Change:  (x )  Normal:  L UE ( )  Hemosiderin   ( x)  Red:  Radiation field anterior chest/axilla. ( )  Erythematous   ( )  Hyperpigmented ( )  Hyperlipodermatosclerosis   Anomalies:    (x )  Axillary web syndrome:  Note palpable cord noted L axilla, with radiating symptoms into upper arm with shoulder abduction, however, visible and palpable cording greatly reduced since initial evaluation, no noted palpable or visible cording beyond mid upper arm. ( )  Vesicles   ( )  Petechiae ( )  Warty Vercusis   ( )  Bullae ( )  Papilloma   Circulatory:  ( )  ABI ( )  Varicosities:   ( x)  Pulses: Palpable ( )  Vascular studies ruled out DVT in past   ( )  DVT History    Nails:  (x )  Normal  ( )  Fungus  Stemmers Sign: negative  (36 or greater: adversely affecting lymphedema)  Volumetric Measurements:   Right: 1595.65 mL Left:  1732.58 mL   % Difference: 8.58% Dominance: Right   (See scanned graph)  Range of Motion: R shoulder flex 90, abd 90, with  elbow, wrist, hand WFL.  Flex 92, Abd 88, ER 30, IR 50.  Pt unable to place hand behind head, behind back.  Strength: Shoulder strength L grossly 4-/5 within available ROM, limited by pain.  Elbow, wrist, hand R 4+/5, L 4-/5.  Sensation:  Pt c/o numbness upper arm, lateral upper quadrant.  Mobility:  Bed/Chair Mobility:  indep Transfers:  indep   Sitting Balance:  good Standing Balance:  good   Gait:  indep Wheelchair Mobility:  na   Endurance:  Appears intact for current level of activity. Pt states she is walking, tolerating up to a mile, however, would like to improve exercise tolerance.  Pt caring for her 89 year old son, primary caregiver. Stairs:  Not assessed.       Safety:  Patient is alert and oriented:  x4   Safety awareness:  intact   Fall Risk?:  Low, use of pain meds evening hours.      Precautions:  Standard lymphedema precautions to include avoiding blood pressure readings, injections and IVs or other procedures/acts that could lead to broken skin on affected area, and avoiding excessive heat, resistive activity or altitude without compression garment    Evaluation Time: 28 minutes    TREATMENT PROVIDED:   1.  Treatment description:  The patient was instructed again in the lymphedema management protocol of complete decongestive therapy (CDT) secondary to noted elevated L UE limb volume with repeat assessment today.  Elevated limb volume may be inflammatory response post XRT vs lymphedema, regardless, management remains the same, and pt would benefit from reduction of limb volume to reduce risk of infection and relieve additional fluid load on an impaired lymphatic system.  Pt was instructed to continue skin care to L UE and upper quadrant to prevent breakdown or infection, lymphedema exercises within limits of pain, and daily wear of CCL1 compression sleeve and gauntlet at this time.  Pt instructed to don compression garments first thing in the morning, and wear until bedtime as tolerated.  Pt in  agreement with this plan stating she does not improvement in her pain with consistent wear of compression sleeve.  Gentle STM performed sub scapularis, pect major/minor, upper trap/rhomboid today, with multiple trigger points noted in musculature.  Gentle manual stretch SCM.  Scapular mobilization performed in side lying, performing tolerated function stretch of pect as well as radiated skin within limits of pain.  Gentle mobilization of axillary cording, applying stretch/counter stretch, again, gentle skin stretch technique, to address cording within limits of pain.  Manipulation of skin in radiation field avoided throughout treatment today.  Pt instructed to continue self mobilization of cording in supine/standing position moving shoulder into abd and externally rotated to range of comfort, adding wrist flex/ext 10 reps, deliberate movement, within limits of pain. Pt previously was only able to tolerated this activity in supine with UE supported,  and is now able to perform in standing position with good results. Pt demonstrates good understanding of this exercise, to continue 3-4 times/day as tolerated.    Treatment time:  47 minutes  Manual therapy 3   Treatment description:  Therapist fit second set of compression garments, sleeve/gauntlet, with good initial fit noted, with pt instructed to wear garment from first thing in the morning until bedtime to reduce L UE limb volume.  Pt instructed to continue Pect stretch in supine over towel roll placed inferior scap tolerating 1 minute well x 2 with towel roll placed vertically between scapulae, then horizontally base of scapulae ;active external rotation x 10 in side lying L UE/R UE with pt instructed to continue program at home including scapular depression bilat, paired with scap add x 10 reps, slow and deliberate within limits of pain to be continued 2x/day within limits of pain.  Pt as instructed in pump management, and was placed in her Comfy sleeve pump to  instruct pt in set up, with therapist recommending pt defer use of pump at this time until skin condition improved and until she sees Dr. Claudie Fisherman regarding breast reconstruction plan, with pt in agreement with this plan.  Pt to bring pump to subsequent appt when use determined indicated and pt able to tolerate treatment at home.    Therapeutic activity:  16 minutes      ASSESSMENT:   Consetta Cosner is a 62 y.o. female who presents with stage 1 lymphedema L UE with mild fullness noted shoulder cap region, axillary web syndrome persistent yet improved, and elevated limb volume today at 8.58%.  Pt tolerated manual therapy with light pressure approach, arriving with significant pain in region, being hypersensitive to touch. Trigger points noted upper quadrant musculature as noted above.  Progress with therapy has been limited due to appts being intermittent, with pt recently returning to treatment s/p 2 week break since conclusion of XRT secondary to itching, peeling, and painful radiation field.  Question whether pt may be dealing with some degree of neuropathic pain, or if pain is musculoskeletal in nature. Shoulder dysfunction noted bilaterally, which pt reports was present prior to mastectomy, which has further limited shoulder function with increase in pain. Will continue to assess as treatment progresses. Patient will benefit from complete decongestive therapy (CDT) including manual lymphatic drainage (MLD) technique, compression garment fit to be worn as indicated, and kinesiotaping as appropriate.  Patient will receive instruction in proper skin care to recognize signs/symptoms of and prevent infection, therapeutic exercise, and self-MLD for independent home program and restorative lymphatic performance.  Pt may also benefit from limited modality use for pain management.    This care is medically necessary due to the infection risk with lymphedema, and to improve functional activities.  CDT is necessary to resolve  swelling to allow patient to return to wearing normal clothes/footwear, and prevent worsening of symptoms, such as venous stasis ulcerations, infections, or hospitalizations.  Patient will be independent with home program strategies to allow improved ADL ability and mobility and to allow patient to return to greatest functional independence.    Rehabilitation potential is considered to be Good for goals.  Factors which may influence rehabilitation potential include significant pain at this time possibly limiting treatment, as well as prior shoulder dysfunction still present, however, pt motivated to improve.  Patient will benefit from 1-2x/week physical therapy visits over 4-8 weeks to optimize improvement in these areas.    PLAN OF CARE:   Recommendations and  Planned Interventions:  Manual lymph drainage/complete decongestive therapy  Compression garment fitting/provision  Lymphedema therapeutic exercise  AROM/PROM/Strength/Coordination  Self-care training  Education in skin care and lymphedema precautions  Self-MLD education per home program  Self-bandaging education per home program  Caregiver education as needed  Vasopneumatic pump for home use as indicated and tolerated     GOALS  Short term goals  Time frame: 2-4 weeks  1. Instruct the patient to be independent with proper skin care to prevent future skin breakdown and decrease the potential risk for infections that are associated with Lymphedema.  2. Patient will be independent with a personal lymphedema exercise program to assist with the lymphatic flow and reduce L UE limb volume by 50-75%.  3. Patient will understand the signs and symptoms of acute infection.  Long term goals  Time frame: 4-8 weeks  1.   Patient will have knowledge of the compression options and acquire a safe and   appropriate daytime and night time compression system to prevent    re-accumulation of fluid.  2.  Patient or family will be able to don/doff garments independently.  Garment    system effectiveness will be evaluated prior to discharge for better long-term   management and outcomes.  3.  Improvement in UEFS by 10-12 points allowing pt to perform at shoulder level activities with 75% less pain.    4.  L shoulder ROM improved by 15-30 degrees at motions with 75% less pain at end range.   5.   To transition patient to independent restorative phase of CDT.           Patient has participated in goal setting and agrees to work toward plan of care.  Patient was instructed to call if questions or concerns arise.    Thank you for this referral.  Theodoro Doing, PT, CLT    Time Calculation: 94 mins    TREATMENT PLAN EFFECTIVE DATES:   05/22/2013 to 08/22/2013  I have read the above plan of care for [facility].  I certify the above prescribed services are required by this patient and are medically necessary.  The above plan of care has been developed in conjunction with the lymphedema/physical therapist.       Physician Signature: ____________________________________Date:______________

## 2013-05-22 NOTE — Telephone Encounter (Signed)
Called and spoke to patient who states she needs to know if her visit with her geneticist will be covered under her insurance.  Patient requesting I call her insurance and also requesting a prescription for her compression sleeve since they had to order a couple because the first one did not compress enough and the second one was too tight.  Notified patient that I would work on this and call her back once I spoke to her insurance company.  Patient verbalizes understanding.

## 2013-05-22 NOTE — Telephone Encounter (Signed)
Pt called again regarding previous message. " She wanted to know if her insurance will cover the visit to the geneticist. She also needs an order for another compression sleeve faxed to [facility]. Her last sleeve was too large. Please call [phone]  "

## 2013-05-23 ENCOUNTER — Telehealth

## 2013-05-23 NOTE — Telephone Encounter (Signed)
Spoke with patient regarding anastrazole.  She states since she began taking it she has been crying more and feeling more anxious.  She states she has also began with some joint pains.  Patient reports she is still taking Celexa and Clonazepam. Advised patient that we can increase her Celexa to 40mg  and she increase Clonazepam to 1 mg bid for the next few days.  Understanding voiced and prescription sent for Celexa 40mg .   Advised patient that if joint pains worsen to let us know and we can discuss options. Understanding voiced.

## 2013-05-23 NOTE — Telephone Encounter (Signed)
Called and spoke to patient who would like a call back from Lonell Grandchild, NP, to discuss her medications.

## 2013-05-23 NOTE — Telephone Encounter (Signed)
Pt called to speak with Emily [name] regarding her medication that she began after radiation. She stated that she is having side effects. Please call [phone]

## 2013-05-23 NOTE — Telephone Encounter (Signed)
Spoke to patient and notified her that I had spoke to her insurance company and that they could not tell me whether her visit with genetics would be covered or not and that per her insurance company patient needed to call member services or either go online and look up provider under member services.  Notified patient that new prescription for compression sleeve had been faxed to the lymphedema clinic, who stated that they would handle the billing of the sleeve with her insurance company.  Patient verbalizes understanding.

## 2013-05-29 NOTE — Patient Instructions (Addendum)
zometa for sept    Continue anastrozole    Return in 2 months

## 2013-05-29 NOTE — Progress Notes (Signed)
Emily [name] is a 62 y.o. female here for follow-up of breast cancer.

## 2013-05-29 NOTE — Progress Notes (Signed)
[facility]  [address], [address]   [place], [place]   [address]  W: [phone]   F: [phone]      f/u HEME/ONC CONSULT      Reason for visit: evaluation for treatment for  Breast Cancer    Consulting physician:  Dr. Dan Humphreys, Dr. Fernanda Drum, Montez Hageman.  Ref physician:  Dr. Maxwell Marion    HPI:   Emily [name] is a 62 y.o.  female who I am seeing for adjuvant therapy for breast cancer.        She had a screening ultrasound on 09/24/12 showing a 2.6 cm left LN.  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  HER 2 negative IHC at 0.    She has a history of a pheochromocytoma surgically removed in 1997.    She then had a MRI breast on 10/05/12 showing the 2.8 x 2 cm LN as well as a left breast LIQ 0.9 cm x 1.1 cm x 0.6 cm mass.  Biopsy of that mass by FNA on 10/16/12 shows a poorly differentiated carcinoma.  These slides were air-dried, HER 2 not able to be performed on them.    Mammaprint of LN shows high risk luminal, ER + at 0.19, PR + at 0.61, HER 2 negative at -0.69.    She underwent L lumpectomy on 11/28/12 showing IDC with neuroendocrine features, gr 2, 0.5 cm, DCIS gr 2-3, + LVI, 1/14 LN + with a 1 cm met, no ECE.  HER 2 negative (ratio 1.2, sig/cell 2.8), ER + 95%, PR + 5%; ki67 5%; DCIS ER + 95%, PR + 1%.    S/p TC q 3 weeks x 4 from 12/25/12-02/26/13 (75 mg/m2; 600 mg/m2)    S/p XRT from 04/02/13-05/10/13    Started anastrozole 05/17/13    Interval history:  Complains of gr 2 hot flashes, gr 3 anxiety/depression (had crying spells when she first started the anastrozole, those have resolved, celexa was increased to 40 mg from 20 mg).  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   Complains of gr 2 cough, gr 2 neuropathy.  Complains of 5/10 joint stiffness in the am, improves during the day.    LMP 1996.      DX   Encounter Diagnoses   Name Primary?   ??? Breast cancer, stage 2, left Yes   ??? Pain       Past Medical History   Diagnosis Date   ??? Cancer      left breast  cancer   ??? Anxiety    ??? Arthritis    ??? Nausea & vomiting    ??? Concussion 2011   ??? Chronic tension headaches    ??? Headache      OCCAS   ??? Other ill-defined conditions      PHEOCHROMOCYTOMA   ??? Other and unspecified symptoms and signs involving general sensations and perceptions      HIVES RECENTLY- WAS IN ER Feb 19, 2013   ??? Lymphedema of arm      left     Past Surgical History   Procedure Laterality Date   ??? Hx orthopaedic       bilateral shoulder arthroscopy   ??? Hx  modified radical mastectomy  11/28/2012     LEFT BREAST MODIFIED RADICAL MASTECTOMY WITH AXILLARY DISSECTION W/ RECONSTRUCTION LEFT BREAST WITH TISSUE EXPANDER AND ALLODERM;PORTACATH INSERTION performed by Binnie Rail, MD at [facility] AMBUL[facility]ORY OR   ??? Hx gyn       c-section   ??? Hx oophorectomy       right   ??? Hx vascular access     ??? Pr breast surgery procedure unlisted  '91     breast implants; replaced bilat implants 2011   ??? Hx breast reconstruction  11/28/2012     BREAST RECONSTRUCTION /C  INSERTION EXPANDER & ALLODERM performed by Ivar Bury, MD at [facility] AMBULATORY OR   ??? Hx heent       T&A   ??? Hx heent       right adrenal removed   ??? Pr abdomen surgery proc unlisted       hernia mesh repair   ??? Hx appendectomy     ??? Pr abdomen surgery proc unlisted       right adenal removed     History     Social History   ??? Marital Status: SINGLE     Spouse Name: N/A     Number of Children: N/A   ??? Years of Education: N/A     Social History Main Topics   ??? Smoking status: Never Smoker    ??? Smokeless tobacco: Never Used   ??? Alcohol Use: No   ??? Drug Use: No   ??? Sexually Active: Not on file     Other Topics Concern   ??? Not on file     Social History Narrative   ??? No narrative  on file     Family History   Problem Relation Age of Onset   ??? Alzheimer Mother    ??? Diabetes Mother    ??? Other Mother      COMBATIVE POST ANESTHESIA   ??? Heart Disease Father    ??? Diabetes Father    ??? Lung Disease Father      COPD   ??? Headache Sister    ??? Migraines Sister        Current Outpatient Prescriptions   Medication Sig Dispense Refill   ??? folic acid (FOLVITE) 1 mg tablet 1 mg daily.       ??? oxycodone-acetaminophen (PERCOCET) 5-325 mg per tablet Take 1 tablet by mouth every four (4) hours as needed for Pain.  50 tablet  0   ??? citalopram (CELEXA) 40 mg tablet Take 0.5 tablets by mouth daily.  30 tablet  4   ??? clonazePAM (KLONOPIN) 0.5 mg tablet Take 1 Tab by mouth two (2) times a day.  60 Tab  2   ??? anastrozole (ARIMIDEX) 1 mg tablet Take 1 Tab by mouth daily.  30 Tab  11   ??? desloratadine (CLARINEX) 5 mg tablet Take 5 mg by mouth daily.       ??? diphenhydrAMINE (BENADRYL) 50 mg tablet Take 1 Tab by mouth nightly as needed for Itching.  30 Tab  0   ??? OTHER cranial prosthesis  1 Each  0   ??? DOCUSATE SODIUM (COLACE PO) Take 1 Tab by mouth daily.           Allergies   Allergen Reactions   ??? Aspirin Unknown (comments)     Gi bleeding   ??? Compazine [Prochlorperazine] Rash   ??? Keflex [Cephalexin] Rash   ??? Levaquin [Levofloxacin]  Hives   ??? Nsaids (Non-Steroidal Anti-Inflammatory Drug) Swelling     Throat swells   ??? Prednisone Rash       Review of Systems    A comprehensive review of systems was performed and all systems were negative except for HPI and for the symptom report form, reviewed and scanned in.      Objective:  Physical Exam:  BP 120/56   Pulse 65   Temp(Src) 96.5 ??F (35.8 ??C) (Oral)   Resp 16   Ht 5\' 6"  (1.676 m)   Wt 124 lb 6.4 oz (56.427 kg)   BMI 20.09 kg/m2   SpO2 98%    General:  Alert, cooperative, no distress, appears stated age.   Head:  Normocephalic, without obvious abnormality, atraumatic.   Eyes:  Conjunctivae/corneas clear. PERRL, EOMs intact.   Throat: Lips, mucosa, and tongue normal.     Neck: Supple, symmetrical, trachea midline, no adenopathy, thyroid: no enlargement/tenderness/nodules   Back:   Symmetric, no curvature. ROM normal. No CVA tenderness.   Lungs:   Clear to auscultation bilaterally.   Chest wall:  No tenderness or deformity.   Heart:  Regular rate and rhythm, S1, S2 normal, no murmur, click, rub or gallop.   Abdomen:   Soft, non-tender. Bowel sounds normal. No masses,  No organomegaly.   Extremities: Extremities normal, atraumatic, no cyanosis or edema.   Skin: No rash   Lymph nodes: Cervical, supraclavicular, and axillary nodes normal.   Neurologic: CNII-XII intact.       Diagnostic Imaging   Results for orders placed during the hospital encounter of 04/22/13   XR CHEST PA LAT    Narrative **Final Report**       ICD Codes / Adm.Diagnosis: 786.2   / Cough    Examination:  CR CHEST PA AND LATERAL  - [id number] - Apr 22 2013 12:27PM  Accession No:  [id number]  Reason:  Cough      REPORT:  CLINICAL HISTORY: Cough  INDICATION: Cough    COMPARISON: None    FINDINGS:  PA and lateral views of the chest are obtained.  The cardiopericardial silhouette is within normal limits. There is no   pleural effusion, pneumothorax or focal consolidation present.        IMPRESSION: No acute intrathoracic disease.              Signing/Reading Doctor: Job Founds [phone])    Approved: [facility] [phone])  Apr 22 2013 12:28PM                                      Results for orders placed in visit on 10/22/12   PET BREAST CANCER DIAGNOSIS   10/12/12 PET:  Negative for distant mets  10/22/12 brain MRI:  Negative    09/24/12 dexa L hip T -2.5; L spine T -2.3  osteoporosis    Lab Results  Lab Results   Component Value Date/Time    WBC 5.4 02/26/2013  9:35 AM    HGB 10.0 02/26/2013  9:35 AM    HCT 30.9 02/26/2013  9:35 AM    PLATELET 259 02/26/2013  9:35 AM    MCV 93.9 02/26/2013  9:35 AM       Lab Results   Component Value Date/Time    Sodium 144 02/05/2013  9:44 AM    Potassium 4.4 02/05/2013  9:44 AM    Chloride 105 02/05/2013  9:44 AM    CO2 29 02/05/2013  9:44 AM    Anion gap 10 02/05/2013  9:44 AM    Glucose 101 02/05/2013  9:44 AM    BUN 14 02/05/2013  9:44 AM    Creatinine 0.32 02/05/2013  9:44 AM    BUN/Creatinine ratio 44 02/05/2013  9:44 AM    GFR est AA >60 02/05/2013  9:44 AM    GFR est non-AA >60 02/05/2013  9:44 AM    Calcium 8.8 02/05/2013  9:44 AM    AST 22 02/05/2013  9:44 AM    Alk. phosphatase 136 02/05/2013  9:44 AM    Protein, total 6.2 02/05/2013  9:44 AM    Albumin 3.8 02/05/2013  9:44 AM    Globulin 2.4 02/05/2013  9:44 AM    A-G Ratio 1.6 02/05/2013  9:44 AM    ALT 52 02/05/2013  9:44 AM     Assessment/Plan:  62 y.o. female with a 0.5 cm left breast cancer, IDC with endocrine features, 1/14 LN +, gr 2, high risk mammaprint,  ER and PR +, HER 2 negative. PS 0    1. Breast cancer with neuroendocrine features, stage IIA, [id number]    With no further therapy, her RR is about 36%; endocrine therapy will decrease her RR to 19%; and 2nd gen chemo will decrease her RR to 12%; 3rd gen to 10%.  These #s may be higher with her high risk mammaprint.     Based on her pheo and now breast cancer history, I recommend genetic counseling -- referral to [facility] made at prior visit. Has seen them and they have performed BRCA testing.    No evidence of recurrence.  Will continue anastrozole for now as it appears that her symptoms are lessoning from it.  Will see her back in 2 months to re-eval.    2. Anxiety/Depression: continue celexa 40 mg daily.  She states this has improved her depression.   continue clonazepam 0.5 mg bid.    3. Osteoporosis:  We discussed the data from Rick Duff al, SABCS 2013, for the meta-analysis for adjuvant bisphosphonate use in breast cancer.  We discussed that in postmenopausal women, it appears that the bisphosphate zolendronic acid, given as in ABCSG 12 at 4 mg IV q 6 months x 3 years, is beneficial in improving bone DFS and OS.  We discussed side effects such as ONJ and the need to avoid invasive dental procedures while on these medications,  renal damage, and hypocalcemia.    She is agreeable to zometa 4 mg IV q 6 months x 3 years and will start in Oct    4. Axillary web pain:  Refilled percocet 1 tab q 4 h prn pain #50 given no refills; counseled on constipation    > 25 minutes were spent with this patient with > 50% of that time spent in face to face counseling.    Follow-up Disposition:  Return in about 2 months (around 07/29/2013).    Dortha Kern, MD

## 2013-05-31 ENCOUNTER — Telehealth

## 2013-05-31 NOTE — Telephone Encounter (Signed)
Called and spoke to patient who states that when she went to pick up her Celexa, it was for the wrong dose.  Patient states she is supposed to be taking Celexa 40mg  daily, but pharmacy was telling her it should be Celexa 20mg  and patient requesting clarification.  Notified patient that per Lonell Grandchild, NP, she should be taking Celexa 40mg  daily.  Notified patient that I had already spoke to [facility] and clarified the dosage and that a new prescription would be sent in.  Patient verbalizes understanding and has no further questions.    Per verbal order from Lonell Grandchild, NP, new prescription sent to [facility] for Celexa 40mg  daily; Dispense 30 tabs with 4 refills.

## 2013-05-31 NOTE — Telephone Encounter (Signed)
Pt called regarding the refill for Celexa. She needs clarification on the instructions.  Please call [phone].

## 2013-06-11 NOTE — Telephone Encounter (Signed)
Returned patient's call.  Patient requesting we call [employer] to do a peer to peer to receive authorization for genetic testing that her genetic counselor from [facility] ordered.  Informed patient that per Lonell Grandchild, NP, we could not do a peer to peer on testing that another physician ordered.  Informed her that we wouldn't know specific testing and rationale of what the geneticist ordered.  Informed patient that she would need to contact [facility] to do authorization.  Patient verbalizes understanding.

## 2013-06-11 NOTE — Telephone Encounter (Signed)
Pt saw geneticist. BRCA 1/2 approved. Pt states that we need to contact medical director at [facility] due to denying further testing regarding hx of  Pheochromocytoma that is being denied.     Please call pt with any questions

## 2013-06-12 NOTE — Progress Notes (Signed)
[facility]  [facility]hedema [facility]  [address]  [address]  [place], [place]  [address]      LYmphedema Therapy  Visit: 2    [x]         Daily note               []        30 day/10th visit progress note    NAME: Emily [name]  DATE: 06/12/2013    GOALS   Short term goals   Time frame: 2-4 weeks   1. Instruct the patient to be independent with proper skin care to prevent future skin breakdown and decrease the potential risk for infections that are associated with Lymphedema.  2. Patient will be independent with a personal lymphedema exercise program to assist with the lymphatic flow and reduce L UE limb volume by 50-75%.  3. Patient will understand the signs and symptoms of acute infection.  Long term goals   Time frame: 4-8 weeks   1. Patient will have knowledge of the compression options and acquire a safe and appropriate daytime and night time compression system to prevent re-accumulation of fluid. 06/12/2013  Pt has 2 sets of CCL 1 compression garments, which she reports tolerating wearing 8 hours/day.  2. Patient or family will be able to don/doff garments independently. Garment system effectiveness will be evaluated prior to discharge for better long-term management and outcomes.   3. Improvement in UEFS by 10-12 points allowing pt to perform at shoulder level activities with 75% less pain.   4. L shoulder ROM improved by 15-30 degrees at motions with 75% less pain at end range.    5. To transition patient to independent restorative phase of CDT.         SUBJECTIVE REPORT:  Pt returns to treatment today stating she has continued with daily wear of compression sleeve/glove 8 hours/day except on rare occasions.  Pt states that the few days she has not worn compression garments, she has noted pain from AWS to be much more intense at bedtime.  Pt reports pain persists L shoulder, primarily at end available ROM shoulder abd/flex.   Reports R shoulder ROM improved since prior visit, which pt attributes to exercise program initiated at time of eval.   Pain:6/10 end range shoulder abd/flex           Gait: indep.  ADLs:  Indep, modifications due to limited shoulder ROM.  Treatment Response:  Continues with limited ROM, guarding area with AAROM, with improvement in L shoulder flex post treatment as noted below.  Reviewed packet received at evaluation.  Function: See eval.  Began MLD (manual lymphatic drainage) techniques with lotioning and skin care and range of motion exercise routine without props.    TREATMENT AND OBJECTIVE DATA SUMMARY:   Patient/Family Education:      Educated in skin care: Reviewed skin care principles to continue daily application of low pH lotion L UE using upward strokes to stimulate lymphatic vessels.       Bilat shoulder ROM at time of eval. Flex 92, Abd 88, ER 30, IR 50-Left  flex 90, abd 90-Right       Instructed in self MLD:   Written sequence given and reviewed with patient as well  as demonstration and instruction during MLD portion of the session.     Instructed in don/doff of compression system:   Compression garment donning principles and wear precautions.  Remove compression if the following occur:  1.  Numbness/tingling increased from what you experience without compression.  2.  Compromise in blood flow, with poor capillary refill, or color changes to toes/fingers.  3.  Onset of pain different from what you may already experience.  4.  Unexplained shortness of breath.  5.  Signs/symptoms of infection including flu-like symptoms, fever, redness, warmth, pain.         Therapeutic Exercise/Procedure 27 minutes   Treatment time: 10:50-11:17 am              Home program: Patient to perform daily to BID skin care, deep abdominal breathing 2 sets of 10, exercise routine performed with assist of therapist as follows: bilat shoulder active ER x 10 in sidelying; pect stretch 1 min x 2 over foam roller with arms in  neutral, no shoulder flex/abd at this time;  Active scapular depression paired with adduction x 10 reps; theraband (yellow) shoulder extension x 10 reps; neural glide shoulder abd to level of tolerance paired with elbow extension/wrist extension within limits of pain.  Pt instructed to continue exercises daily 1-2 sets within limits of pain.     Rationale: Exercise will increase the lymph angiomotoricity and tissue pressure of the skin and thus decrease swelling.       Manual Therapy 63 minutes   Treatment time: 9:45-10:48am  Area to decongest: L UE   Sequence used and effectiveness: Diaphragmatic breathing at initiation and conclusion of MLD sequence.  Short neck sequence.  Stim axillary R/inguinal lnn L.  Activated AAA/PAA/ L AI pathways.  Stimulated cephalic vasovasorum. Full UE sequence including medial to lateral upper arm techniques.   AWS techniques including distal/proximal cord gliding, working primarily in axilla with pt c/o tension proximal vs distal at this time. Perpendicular mobilization of cord in axilla.  STM pect major/minor, with myofascial release pect minor along clavical multiple trigger points noted with good response.  STM upper trap, cervical paraspinals, lats/sub scap. Pect minor manual stretch in sidelying with good response.    L shoulder flex at initiation of treatment 85 degrees, improved to 93 degrees at conclusion of treatment.  Abd remains limited at 90.   R shoulder flex assessed at 105 degrees today at initiation of treatment, noted improved from 90 degrees at most recent eval, with no c/o pain at end range per pt report today.                    Skin/wound care/debridement: Intact.             Upper/Lower extremity compression: Pt has 2 sets of Juzo 2001 compression garments L UE, which she did not bring to appt today.  Pt to bring to next appt to assess fit.                         Kinesiotaping: na   Girth/Volume measurement: Pt arrives without compression garments in place, with  limb volume measurements deferred.  Repeat limb volume next scheduled appt.     TOTAL TREATMENT 92 mins       ASSESSMENT:   Treatment effectiveness and tolerance: Shoulder dysfunction L continues with visible cording note in axilla x 3 with shoulder flex/abd.  Pt c/o pain primarily in axilla from cording at  time time.  Pectoral tightness/multiple trigger points noted and addressed along clavicular attachment today. Progression of exercise program today tolerated, with pt to continue daily within limits of pain, reducing repetitions as needed based on tolerance.   Progress toward goals: Ongoing at this time.       PLAN OF CARE:   Changes to the plan of care: Assess response to changes in treatment today, including reassessing L UE limb volumes.  Assess response to progression of HEP.  Continue MLD/manual therapy.  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     Frequency: Pt would benefit from 2x/week treatment in clinic, however, due to distance pt lives from clinic, a minimum of weekly treatment is recommended at this time.   Other: Assess fit of compression garments.         Kainalu Heggs A Kelon Easom

## 2013-06-19 NOTE — Telephone Encounter (Signed)
Called and spoke to patient who has concerns about some of the side effects of Zometa.  Patient states she is driving right now and does not have her list of questions in front of her so she request that I call her back in the morning.  Informed patient that would be fine.

## 2013-06-19 NOTE — Telephone Encounter (Signed)
Please call pt back regarding questions about upcoming zometa infusion

## 2013-06-19 NOTE — Progress Notes (Signed)
[facility]  [facility]hedema [facility] and [facility]  [address]  [address]  [place], [place]  [address]      LYmphedema Therapy  Visit: 3    [x]         Daily note               []        30 day/10th visit progress note    NAME: Emily [name]  DATE: 06/19/2013    GOALS   Short term goals   Time frame: 2-4 weeks   1. Instruct the patient to be independent with proper skin care to prevent future skin breakdown and decrease the potential risk for infections that are associated with Lymphedema.  2. Patient will be independent with a personal lymphedema exercise program to assist with the lymphatic flow and reduce L UE limb volume by 50-75%.  3. Patient will understand the signs and symptoms of acute infection.  Long term goals   Time frame: 4-8 weeks   1. Patient will have knowledge of the compression options and acquire a safe and appropriate daytime and night time compression system to prevent re-accumulation of fluid. 06/12/2013  Pt has 2 sets of CCL 1 compression garments, which she reports tolerating wearing 8 hours/day.  2. Patient or family will be able to don/doff garments independently. Garment system effectiveness will be evaluated prior to discharge for better long-term management and outcomes.   3. Improvement in UEFS by 10-12 points allowing pt to perform at shoulder level activities with 75% less pain.   4. L shoulder ROM improved by 15-30 degrees at motions with 75% less pain at end range.    5. To transition patient to independent restorative phase of CDT.         SUBJECTIVE REPORT:  Pt returns to treatment today stating she has continued with daily wear of compression sleeve/glove 8 hours/day except on rare occasions.  Pt states that most recently fit compression sleeve has a hole in it, requesting therapist assist with replacing garment.  [employer] rep contacted stating sleeve can be returned and replaced.  Pt reports she was "sore" the day  after most recent treatment, however, reports the next 3 days she "felt better".  States her need for oral pain medication as reduced to use at night only.  Pt reports overall she is feeling better, however, reduced shoulder mobility persists.   Pain:5/10 end range shoulder abd/flex           Gait: indep.  ADLs:  Indep, modifications due to limited shoulder ROM.  Treatment Response:  Continues with limited ROM, guarding area with AAROM, with improvement in L shoulder flex post treatment as noted below.  Good understanding of stretches added today.  Function: See eval.  Began MLD (manual lymphatic drainage) techniques with lotioning and skin care and range of motion exercise routine without props.    TREATMENT AND OBJECTIVE DATA SUMMARY:   Patient/Family Education:      Educated in skin care: Reviewed skin care principles to continue daily application of low pH lotion L UE using upward strokes to stimulate lymphatic vessels.       Bilat shoulder ROM at time of eval. Flex 92, Abd 88, ER 30, IR 50-Left  flex 90, abd 90-Right  Instructed in self MLD:   Written sequence given and reviewed with patient as well as demonstration and instruction during MLD portion of the session.     Instructed in don/doff of compression system:   Compression garment donning principles and wear precautions.  Remove compression if the following occur:  1.  Numbness/tingling increased from what you experience without compression.  2.  Compromise in blood flow, with poor capillary refill, or color changes to toes/fingers.  3.  Onset of pain different from what you may already experience.  4.  Unexplained shortness of breath.  5.  Signs/symptoms of infection including flu-like symptoms, fever, redness, warmth, pain.         Therapeutic Exercise/Procedure 12 minutes   Treatment time: 10:32-10:44 am              Home program: Patient to perform daily to BID skin care, deep abdominal breathing 2 sets of 10, exercise routine pt to continue  includes: bilat shoulder active ER x 10 in sidelying; pect stretch 1 min x 2 over foam roller with arms in neutral, no shoulder flex/abd at this time;  Active scapular depression paired with adduction x 10 reps; theraband (yellow) shoulder extension x 10 reps; neural glide shoulder abd to level of tolerance paired with elbow extension/wrist extension within limits of pain.  Pt instructed in and performs the following with assist of therapist:  Scalenes stretch x 2 holding 20 seconds each; First rib mob with sheet to stabilize x 2 holding 20 seconds each; SCM stretch holding mat table or chair paired with cervical extension x 2 holding 20 seconds. Pt performs the above stretches within limits of pain without c/o.  Pt instructed to continue exercises daily 1-2 sets within limits of pain.     Rationale: Exercise will increase the lymph angiomotoricity and tissue pressure of the skin and thus decrease swelling.       Manual Therapy 62 minutes   Treatment time: 9:28-10:30 am  Area to decongest: L UE   Sequence used and effectiveness: Diaphragmatic breathing at initiation and conclusion of MLD sequence.  Short neck sequence.  Stim axillary R/inguinal lnn L.  Activated AAA/PAA/ L AI pathways.  Stimulated cephalic vasovasorum. Trunk sequence only today to address proximal axillary cording.  AWS techniques including distal/proximal cord gliding, working primarily in axilla with pt c/o tension proximal vs distal at this time. Perpendicular mobilization of cord in axilla.  STM pect major/minor, with myofascial release pect minor along clavical multiple trigger points noted with good response.  STM upper trap, cervical/thoracic paraspinals, lats/sub scap, scalene release, with good response of lowering of first rib. Pect minor manual stretch in sidelying with good response.    L shoulder flex at initiation of treatment 88 degrees, improved to 100 degrees at conclusion of treatment.  Abd remains limited at 94.                        Skin/wound care/debridement: Intact.             Upper/Lower extremity compression: Pt dons compression sleeve/glove at conclusion of treatment.                         Kinesiotaping: na   Girth/Volume measurement: Deferred.     TOTAL TREATMENT 81 mins       ASSESSMENT:   Treatment effectiveness and tolerance: Shoulder dysfunction L continues with visible cording note in axilla x 2 with  shoulder flex/abd, however, musculoskeletal abnormalities also contributing to shoulder limitations.  Good improvement in ROM post treatment.  Pt c/o pain primarily in axilla from cording at time time.  Pectoral/scalene/upper trap, thoracic paraspinal tightness/multiple trigger points noted and addressed along clavicular attachment today. Stretches added to home exercise program today as noted above, with pt to continue daily within limits of pain, reducing repetitions as needed based on tolerance.   Progress toward goals: Ongoing at this time.       PLAN OF CARE:   Changes to the plan of care: Assess response to changes in treatment, including HEP.  Continue MLD/manual therapy.  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     Frequency: Pt would benefit from 2x/week treatment in clinic, however, due to distance pt lives from clinic, a minimum of weekly treatment is recommended at this time.   Other: Fit replacement garment upon receiving.       Theodoro Doing, PT, CLT

## 2013-06-20 NOTE — Telephone Encounter (Signed)
Called and spoke to patient who states she had read some information on Zometa and the side effects and had some concerns about having dental work done.  Patient states she has a lot of crowns and didn't know if she might possibly need work done.  Patient states she discussed it with her dentist and just had a cleaning and that her dentist said he didn't foresee that she will need any dental work done in the next 6 months to a year.  Informed patient that per Lonell Grandchild, NP, if she has already been cleared by the dentist and he doesn't think she will need any work done in the next couple months then we would just have to take it as it comes and as problems present themselves, but that she is still able to get a dental cleaning.  Patient verbalizes understanding and has no further questions at this time.

## 2013-06-26 ENCOUNTER — Encounter

## 2013-06-26 NOTE — Progress Notes (Signed)
[facility]  Lymphedema Clinic and Cancer Rehabilitation  [address]  [address]  [place], [place]  [address]      LYmphedema Therapy  Visit: 4    [x]         Daily note               []        30 day/10th visit progress note    NAME: Emily [name]  DATE: 06/26/2013    GOALS   Short term goals   Time frame: 2-4 weeks   1. Instruct the patient to be independent with proper skin care to prevent future skin breakdown and decrease the potential risk for infections that are associated with Lymphedema.  2. Patient will be independent with a personal lymphedema exercise program to assist with the lymphatic flow and reduce L UE limb volume by 50-75%.  3. Patient will understand the signs and symptoms of acute infection.  Long term goals   Time frame: 4-8 weeks   1. Patient will have knowledge of the compression options and acquire a safe and appropriate daytime and night time compression system to prevent re-accumulation of fluid. 06/12/2013  Pt has 2 sets of CCL 1 compression garments, which she reports tolerating wearing 8 hours/day.   2. Patient or family will be able to don/doff garments independently. Garment system effectiveness will be evaluated prior to discharge for better long-term management and outcomes.   06/26/2013 goal met  3. Improvement in UEFS by 10-12 points allowing pt to perform at shoulder level activities with 75% less pain. 06/26/2013 ongoing  4. L shoulder ROM improved by 15-30 degrees at motions with 75% less pain at end range.  06/26/2013 ongoing  5. To transition patient to independent restorative phase of CDT.         SUBJECTIVE REPORT:  Pt returns to treatment today stating she has continued with daily wear of compression sleeve/glove 8 hours/day except on rare occasions, noting improvement of UE symptoms when compression is in place.  Pt reporting pain continuing to be cyclical, stating, "I have a good day, so I do more with my son, like  play hopscotch, for example, and the next day I have severe pain."  Pt reports pain increased with upright posture, shoulder elevation/R UE functional use.  States at times her son will fall and pull on her arm and the pain becomes excruciating.  Pt c/o pain from expander, stating, "it is too big and feels like a rock on my chest."  States reconstructive surgery scheduled for 09/2013. Pt reporting sensitive "lump" L axilla, which is severely painful to touch today. States her need for oral pain medication as reduced to use at night only.     Pain:8/10 with palpation of small nodule L axilla, improved by more than 50% after MLD with nodule note to be smaller with palpation.  Seroma?  Pt reports need for pain meds at night due to discomfort after compression garment removed L UE.           Gait: indep.  ADLs:  Indep, modifications due to limited shoulder ROM.  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  Noting pain increases in this area with  thoracic extension and lateral flex R.  Question if full expander triggering condition, with pt c/o pain in region of expander, feeling it is heavy, reporting she feels it is "too big."  Function: See eval.  Began MLD (manual lymphatic drainage) techniques with lotioning and skin care and range of motion exercise routine without props.    TREATMENT AND OBJECTIVE DATA SUMMARY:   Patient/Family Education:      Educated in skin care: Reviewed skin care principles to continue daily application of low pH lotion L UE using upward strokes to stimulate lymphatic vessels.       Bilat shoulder ROM at time of eval. Flex 92, Abd 88, ER 30, IR 50-Left         Instructed in self MLD:   Written sequence given and reviewed with patient as well as demonstration and instruction during MLD portion of the session.     Instructed in don/doff of compression system:   Compression garment donning principles and wear precautions.   Remove compression if the following occur:  1.  Numbness/tingling increased from what you experience without compression.  2.  Compromise in blood flow, with poor capillary refill, or color changes to toes/fingers.  3.  Onset of pain different from what you may already experience.  4.  Unexplained shortness of breath.  5.  Signs/symptoms of infection including flu-like symptoms, fever, redness, warmth, pain.         Therapeutic Exercise/Procedure 5 minutes   Treatment time:                Home program: Patient to perform daily to BID skin care, deep abdominal breathing 2 sets of 10, exercise routine pt to continue includes: bilat shoulder active ER x 10 in sidelying; pect stretch 1 min x 2 over foam roller with arms in neutral, no shoulder flex/abd at this time;  Active scapular depression paired with adduction x 10 reps;  neural glide shoulder abd to level of tolerance paired with elbow extension/wrist extension within limits of pain. To continue scalenes stretch x 2 holding 20 seconds each; First rib mob with sheet to stabilize x 2 holding 20 seconds each; SCM stretch holding mat table or chair paired with cervical extension x 2 holding 20 seconds.  Pt instructed in and performs the following with assist of therapist:   Thoracic extension/lateral flex R, to continue 5 reps, within limits of pain, 2x/day as tolerated.     Rationale: Exercise will increase the lymph angiomotoricity and tissue pressure of the skin and thus decrease swelling.       Manual Therapy 58 minutes   Treatment time: 12:34-1:32 pm  Area to decongest: L UE   Sequence used and effectiveness: Diaphragmatic breathing at initiation and conclusion of MLD sequence.  Short neck sequence.  Stim axillary R/inguinal lnn L.  Activated AAA/PAA/ L AI pathways.  Stimulated cephalic vasovasorum. Trunk sequence only today to address apparent seroma/Mondor's.  Upper quadrant sequence including stim of perforating precollectors--intercostals, parasternals,  paraspinals.  Medial to lateral technique upper quad anterior/posterior to sternum/spine, stimulating appropriate precollectors, 5 reps pathway, x 5 sets.  Focus on re-working AAA/PAA/L AI throughout treatment.  Pt was instructed in self MLD trunk sequence including short neck, stim R axillary/L inguinal lnn, activating AAA/L AI applying gentle skin stretch toward uninvolved lymph nodes, followed pt diaphragmatic breathing x 10 reps.  Pt instructed to have significant other activate PAA pathway when available.  Skin/wound care/debridement: Intact.             Upper/Lower extremity compression: Pt dons compression sleeve/glove at conclusion of treatment.                         Kinesiotaping: Applied KT anchor lower quad L, fan toward axilla avoiding radiated skin.  Pt instructed to monitor tape, removing with any redness, irritation, using soap and water to loosen adhesive.  If no adverse reaction, pt to remove tape within 3-5 days, with pt verbalizing understanding of tape management.   Girth/Volume measurement: Deferred.     TOTAL TREATMENT 64 mins       ASSESSMENT:   Treatment effectiveness and tolerance: Shoulder dysfunction L continues with visible cording noted in axilla x 2, noting cord extending to lateral upper quad and anterior upper quad inferior to tissue expander, which is newly noted today and tender to palpation. 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   Shoulder flex/abd paired with thoracic extension increases symptoms along noted cording also consistent with Mondor's. Due to pt c/o regarding full tissue expander, question role this may be playing in potentially compressing venous/lymphatic structure?  Pt reports exchange of expander scheduled for December, with plan to have smaller implant used. This may aide in resolving  condition if benefit is not consistently achieved prior to this date. Pt may benefit from decreasing volume of expander if symptoms continue or worsen, with pt to consider discussing this with Dr. Claudie Fisherman as indicated.  Management of condition is manual lymph drainage, kinesiotape to promote venous/lymph return, mobilizing vessels using glide techniques, both of which have been a part of pt's treatment plan.  Added thoracic extension/lateral flex R today, to address cording inferior to implant.  Pt did benefit from MLD today, with tenderness in axilla reduced by more than 50% per subjective report.  Pt reports significant benefit from wear of compression sleeve/gauntlet, stating UE symptoms are nearly absent when wearing garments, which aide lymphatic return, as well as assist in addressing any potential neuropathic symptoms.  Pt may benefit from trial of increasing frequency of PT visits at this clinic, however, due to distance pt lives from clinic, this is difficult for her.  Pt states she prefers treatment in this setting vs lymphedema center at [facility] based on her experience of former visits there.  Pt does have a vasopneumatic pump, which was ordered prior to her evaluation in this clinic.  Pt brought to previous appt, and at lowest pressure setting, was unable to tolerate use of device at that time.  May have improved tolerance after reconstruction completed.  Pt was instructed in self MLD which is a tool she can use at home to promote resolution of current condition.   Progress toward goals: Ongoing at this time.       PLAN OF CARE:   Changes to the plan of care: Assess response to changes in treatment, including HEP.  Continue MLD/manual therapy.  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 Assess response to KT and continue as indicated.   Frequency: Pt would benefit from 2x/week treatment in clinic, however, due to distance pt lives from clinic, a minimum of weekly treatment is  recommended at this time.   Other: Fit replacement compression garment L UE  upon receiving.       Elease Hashimoto  Mordecai Maes, PT, CLT    Cc:  Dr. Elroy Channel, Dr. Dan Humphreys, Dr. Maxwell Marion.

## 2013-07-10 LAB — POC CHEM8
Anion gap (POC): 15 mmol/L (ref 5–15)
BUN (POC): 15 MG/DL (ref 9–20)
CO2 (POC): 28 MMOL/L (ref 21–32)
Calcium, ionized (POC): 1.23 MMOL/L (ref 1.12–1.32)
Chloride (POC): 101 MMOL/L (ref 98–107)
Creatinine (POC): 0.8 MG/DL (ref 0.6–1.3)
GFRAA, POC: >60 mL/min/{1.73_m2} (ref >60)
GFRNA, POC: >60 mL/min/{1.73_m2} (ref >60)
Glucose (POC): 99 MG/DL (ref 65–105)
Hematocrit (POC): 37 % (ref 35.0–47.0)
Hemoglobin (POC): 12.6 GM/DL (ref 11.5–16.0)
Potassium (POC): 4 MMOL/L (ref 3.5–5.1)
Sodium (POC): 139 MMOL/L (ref 136–145)

## 2013-07-10 MED ADMIN — zoledronic acid (ZOMETA) 4 mg/100 ml infusion: INTRAVENOUS | @ 17:00:00 | NDC 25021082682

## 2013-07-10 MED FILL — ZOMETA 4 MG/100 ML INTRAVENOUS PIGGYBACK: 4 mg/100 mL | INTRAVENOUS | Qty: 100

## 2013-07-10 NOTE — Progress Notes (Signed)
1300 Pt arrived at [facility] ambulatory and in no distress for injection.  Assessment completed, no new complaints voiced.  Patient and family given information on new medications and verbalized understanding.  Blood pressure 107/69, pulse 71, temperature 98 ??F (36.7 ??C), resp. rate 71, SpO2 100.00%.  Recent Results (from the past 12 hour(s))   POC CHEM8    Collection Time     07/10/13  1:23 PM       Result Value Range    Calcium, ionized (POC) 1.23  1.12 - 1.32 MMOL/L    Sodium (POC) 139  136 - 145 MMOL/L    Potassium (POC) 4.0  3.5 - 5.1 MMOL/L    Chloride (POC) 101  98 - 107 MMOL/L    CO2 (POC) 28  21 - 32 MMOL/L    Anion gap (POC) 15  5 - 15 mmol/L    Glucose (POC) 99  65 - 105 MG/DL    BUN (POC) 15  9 - 20 MG/DL    Creatinine (POC) 0.8  0.6 - 1.3 MG/DL    GFR-AA (POC) >16  >10 ml/min/1.62m2    GFR, non-AA (POC) >60  >60 ml/min/1.50m2    Hemoglobin (POC) 12.6  11.5 - 16.0 GM/DL    Hematocrit (POC) 37  35.0 - 47.0 %    Comment Comment Not Indicated.       Medications received:  zometa    1420 Tolerated treatment well, no adverse reaction noted.  D/C'd from [facility] ambulatory and in no distress accompanied by family.  Next appt 01/08/14 1300

## 2013-07-12 NOTE — Progress Notes (Addendum)
[facility]  Lymphedema Clinic and Cancer Rehabilitation  [address]  [address]  [place], [place]  [address]      LYmphedema Therapy  Visit: 5    [x]         Daily note               []        30 day/10th visit progress note    NAME: Emily [name]  DATE: 07/12/2013    GOALS   Short term goals   Time frame: 2-4 weeks   1. Instruct the patient to be independent with proper skin care to prevent future skin breakdown and decrease the potential risk for infections that are associated with Lymphedema.  2. Patient will be independent with a personal lymphedema exercise program to assist with the lymphatic flow and reduce L UE limb volume by 50-75%. 07/12/2013  Repeat bilat UE limb volume measurements with limb volume discrepancy 3.84%, improved from 8.85%.  See scanned graph.  3. Patient will understand the signs and symptoms of acute infection.  Long term goals   Time frame: 4-8 weeks   1. Patient will have knowledge of the compression options and acquire a safe and appropriate daytime and night time compression system to prevent re-accumulation of fluid. 06/12/2013  Pt has 2 sets of CCL 1 compression garments, which she reports tolerating wearing 8 hours/day. 07/12/2013  Pt received replacement compression garment from [employer] secondary to manufacturer's defect, with good fit noted.  2. Patient or family will be able to don/doff garments independently. Garment system effectiveness will be evaluated prior to discharge for better long-term management and outcomes.   06/26/2013 goal met  3. Improvement in UEFS by 10-12 points allowing pt to perform at shoulder level activities with 75% less pain. 06/26/2013 ongoing.  07/12/2013  Ongoing, improved by 9 points, pain persists.    4. L shoulder ROM improved by 15-30 degrees at motions with 75% less pain at end range.  06/26/2013 ongoing  5. To transition patient to independent restorative phase of CDT.         SUBJECTIVE  REPORT:  Pt returns to treatment today stating she has continued with daily wear of compression sleeve/glove 8 hours/day, wearing with recent air travel to [place].  Pt states she did note increased swelling upper arm L after air travel, improved since returning home, however, does note compression garments to be fitting slightly more snug, however, not too tight.  Pt states she has continue exercise program as instructed, continuing with pain, however, less so with upright posture.  L shoulder ROM remains limited due to axillary pain.  Pt reports overall feeling of mild muscle pain bilat LE post infusion 07/10/2013 Zometa, which she states is a noted side effect.  Improved upon getting up and moving around this morning.   Pain:5/10 L axilla with shoulder flex/abd within available ROM.  Tenderness 4/10 inferior tissue expander, improved as noted in Manual therapy section below.            Gait: indep.  ADLs:  Indep, modifications due to limited shoulder ROM.  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  Question if full expander triggering condition,  with pt c/o pain in region of expander, feeling it is heavy, reporting she feels it is "too big."  Function: UEFS outcomes measure:  30/80, increased by 9 points since 05/22/2013.  Began MLD (manual lymphatic drainage) techniques with lotioning and skin care and range of motion exercise routine without props.    TREATMENT AND OBJECTIVE DATA SUMMARY:   Patient/Family Education:      Educated in skin care: Reviewed skin care principles to continue daily application of low pH lotion L UE using upward strokes to stimulate lymphatic vessels.       Bilat shoulder ROM at time of eval. Flex 92, Abd 88, ER 30, IR 50-Left  L shoulder flex 100; abd 97 post treatment today, guarded active movement persists.       Instructed in self MLD:   Written sequence given and reviewed with  patient as well as demonstration and instruction during MLD portion of the session.     Instructed in don/doff of compression system:   Compression garment donning principles and wear precautions.  Remove compression if the following occur:  1.  Numbness/tingling increased from what you experience without compression.  2.  Compromise in blood flow, with poor capillary refill, or color changes to toes/fingers.  3.  Onset of pain different from what you may already experience.  4.  Unexplained shortness of breath.  5.  Signs/symptoms of infection including flu-like symptoms, fever, redness, warmth, pain.         Therapeutic Exercise/Procedure 0 minutes   Treatment time:                Home program: Patient to perform daily to BID skin care, deep abdominal breathing 2 sets of 10, exercise routine pt to continue includes: bilat shoulder active ER x 10 in sidelying; pect stretch 1 min x 2 over foam roller with arms in neutral, no shoulder flex/abd at this time;  Active scapular depression paired with adduction x 10 reps;  neural glide shoulder abd to level of tolerance paired with elbow extension/wrist extension within limits of pain. To continue scalenes stretch x 2 holding 20 seconds each; First rib mob with sheet to stabilize x 2 holding 20 seconds each; SCM stretch holding mat table or chair paired with cervical extension x 2 holding 20 seconds.  Pt to continue:  Thoracic extension/lateral flex R, to continue 5 reps, within limits of pain, 2x/day as tolerated.     Rationale: Exercise will increase the lymph angiomotoricity and tissue pressure of the skin and thus decrease swelling.       Manual Therapy 58 minutes   Treatment time: 11:04am-12:02 pm  Area to decongest: L UE   Sequence used and effectiveness: Diaphragmatic breathing at initiation and conclusion of MLD sequence.  Short neck sequence.  Stim axillary R/inguinal lnn L.  Activated AAA/PAA/ L AI pathways.  Stimulated cephalic vasovasorum. Trunk sequence with  UE sequence to elbow performed.  Upper quadrant sequence including stim of perforating precollectors-- parasternals, paraspinals.  Medial to lateral technique upper quad anterior/posterior to sternum/spine, stimulating appropriate precollectors, 5 reps pathway, x 5 sets.  Focus on re-working AAA/PAA/L AI throughout treatment.  Pt was instructed to continue self MLD trunk sequence including short neck, stim R axillary/L inguinal lnn, activating AAA/L AI applying gentle skin stretch toward uninvolved lymph nodes, followed pt diaphragmatic breathing x 10 reps.  Pt instructed to have significant other activate PAA pathway when available.   Neural glides to address AWS, pairing shoulder abd, with pt  tolerating to 88, with full elbow/wrist extension x 10 reps.    STM pect major/minor, scalene, SCM, upper trap.  Gentle mob of tissue expander medially and superiorly within tolerance of pt.  Pt reports mob of expander "feels good" with resolution of tenderness apparent venous structure tenderness after mob performed.                        Skin/wound care/debridement: Intact.             Upper/Lower extremity compression: Pt dons compression sleeve/glove at conclusion of treatment.                         Kinesiotaping: Deferred   Therapeutic activity:  12:04-12:20 pm    16 minutes Repeat limb volume performed today with bilat UE volumes elevated, potentially secondary to noted weight gait, however, limb volume discrepancy improved from over 8% difference to 3.84% difference, which is a significant improvement.    Pt instructed in postural correction, including performing scapular add with depression, followed by active shoulder flex to 100 degrees, abd to 90, with tension reported in axilla.  Pt performs trunk extension and R lateral bend without pain today, which is an improvement since most recent visit.     TOTAL TREATMENT 76 mins       ASSESSMENT:   Treatment effectiveness and tolerance: Shoulder dysfunction L continues  with visible cording noted in axilla x 1, noted today, however, improvement in shoulder ROM post treatment noted as documented above. Axillary cord extending to lateral upper quad and anterior upper quad inferior to tissue expander,tender to palpation, improved after gentle medial/superior mobilization of tissue expander. Symptoms remain consistent with Mondor's disease.  Shoulder flex/abd paired with thoracic extension increases symptoms along noted cording also consistent with Mondor's, however, improvement noted with these activities today. Due to pt c/o regarding full tissue expander, question role this may be playing in potentially compressing venous/lymphatic structure?  Pt reports exchange of expander scheduled for December, with plan to have smaller implant used. This may aide in resolving condition if benefit is not consistently achieved prior to this date. Pt may benefit from decreasing volume of expander if symptoms continue or worsen, with pt to consider discussing this with Dr. Claudie Fisherman as indicated.  Management of condition is manual lymph drainage, kinesiotape to promote venous/lymph return, mobilizing vessels using glide techniques, both of which have been a part of pt's treatment plan.  Continue thoracic extension/lateral flex R today, to address cording inferior to implant. Note improvement in outcomes measures as noted above. Pt reports significant benefit from wear of compression sleeve/gauntlet, stating UE symptoms are nearly absent when wearing garments, which aide lymphatic return, as well as assist in addressing any potential neuropathic symptoms.  Pt would benefit from trial of increasing frequency of PT visits at this clinic, however, due to distance pt lives from clinic, this is difficult for her. This has limited progress with treatment at this time, however, noted AWS and shoulder ROM improving slowly.  Pt states she prefers treatment in this setting vs lymphedema center at [facility]  based on her experience of former visits there.  Pt does have a vasopneumatic pump, which was ordered prior to her evaluation in this clinic.  Pt brought to previous appt, and at lowest pressure setting, was unable to tolerate use of device at that time.  May have improved tolerance after reconstruction completed.  Pt was instructed in  self MLD prior treatment which is a tool she can use at home to promote resolution of current condition.   Progress toward goals: Ongoing at this time.       PLAN OF CARE:   Changes to the plan of care: Assess response to continuation of treatment as pt is able to get here, including HEP as noted above progressing as tolerated.  Continue MLD/manual therapy.  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    Frequency: Pt would benefit from 2x/week treatment in clinic, however, due to distance pt lives from clinic, a minimum of weekly treatment is recommended at this time.   Other: na       Theodoro Doing, PT, CLT

## 2013-07-17 ENCOUNTER — Telehealth

## 2013-07-17 NOTE — Telephone Encounter (Signed)
Please call pt back regarding recent infusion and side effects she is expereiencing. [phone].

## 2013-07-17 NOTE — Telephone Encounter (Signed)
Returned patient's call who states that she received Zometa on 07/10/13 and that afterwards she was in the bed for two days with pain in her muscles and thighs and that now she is having pain in her joints.  Patient states it feels like her knees are going to give out on her.  Patient states that she did test positive for rheumatoid arthritis, but has never had pain like this in her joints.  Patient states that she only has a couple of Percocet left and that she is unable to take anti-inflammatories.  Informed patient that per Tressie Ellis, NP, if her symptoms are coming from the Zometa that it should be out of her system soon and that a prescription would be written for Percocet and would be ready for pick-up whenever patient could get to office.  Patient verbalizes understanding and has no further questions at this time.

## 2013-07-30 ENCOUNTER — Encounter

## 2013-07-30 NOTE — Telephone Encounter (Signed)
Patient seen by Dr Elroy Channel this morning.  Called clinic requesting refill of Percoset, states that she will run out of her current Rx prior to her next visit.  Discussed with Dr. Elroy Channel.  Rx for oxycodone-APAP 5/325 mg #50 no refills provided to patient.    Tressie Ellis, NP

## 2013-07-30 NOTE — Progress Notes (Signed)
[facility]  [address], [address]   [place], [place]   [address]  W: [phone]   F: [phone]      f/u HEME/ONC CONSULT      Reason for visit: evaluation for treatment for  Breast Cancer    Consulting physician:  Dr. Dan Humphreys, Dr. Fernanda Drum, Montez Hageman.  Ref physician:  Dr. Maxwell Marion    HPI:   Emily [name] is a 62 y.o.  female who I am seeing for adjuvant therapy for breast cancer.        She had a screening ultrasound on 09/24/12 showing a 2.6 cm left LN.  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  HER 2 negative IHC at 0.    She has a history of a pheochromocytoma surgically removed in 1997.    She then had a MRI breast on 10/05/12 showing the 2.8 x 2 cm LN as well as a left breast LIQ 0.9 cm x 1.1 cm x 0.6 cm mass.  Biopsy of that mass by FNA on 10/16/12 shows a poorly differentiated carcinoma.  These slides were air-dried, HER 2 not able to be performed on them.    Mammaprint of LN shows high risk luminal, ER + at 0.19, PR + at 0.61, HER 2 negative at -0.69.    She underwent L mastectomy on 11/28/12 showing IDC with neuroendocrine features, gr 2, 0.5 cm, DCIS gr 2-3, + LVI, 1/14 LN + with a 1 cm met, no ECE.  HER 2 negative (ratio 1.2, sig/cell 2.8), ER + 95%, PR + 5%; ki67 5%; DCIS ER + 95%, PR + 1%.    S/p TC q 3 weeks x 4 from 12/25/12-02/26/13 (75 mg/m2; 600 mg/m2)    S/p XRT from 04/02/13-05/10/13    Started anastrozole 05/17/13, stop 07/30/13 (joint pains)    Started zometa 07/10/13, q 6 months x 3 years    Interval history: takes 1 percocet at night for lymphedema pain, 6/10, cramping, complains of gr 2 anxiety and depression, controlled with celexa and clonazepam, gr 2 hot flashes.  Also complains of 6/10 joint  stiffness in her knees and ankles.  States she has RA as well.  States her gr 2 cough is improved, complains of gr 2 numbness in her left arm.    LMP 1996.      DX   Encounter Diagnosis   Name Primary?   ??? Breast cancer, stage 2, left Yes      Past Medical History   Diagnosis Date   ??? Cancer      left breast  cancer   ??? Anxiety    ??? Arthritis    ??? Nausea & vomiting    ??? Concussion 2011   ??? Chronic tension headaches    ??? Headache      OCCAS   ??? Other ill-defined conditions      PHEOCHROMOCYTOMA   ??? Other and unspecified symptoms and signs involving general sensations and perceptions      HIVES RECENTLY- WAS IN ER Feb 19, 2013   ??? Lymphedema of arm      left     Past Surgical History   Procedure Laterality Date   ??? Hx orthopaedic       bilateral shoulder arthroscopy   ???  Hx modified radical mastectomy  11/28/2012     LEFT BREAST MODIFIED RADICAL MASTECTOMY WITH AXILLARY DISSECTION W/ RECONSTRUCTION LEFT BREAST WITH TISSUE EXPANDER AND ALLODERM;PORTACATH INSERTION performed by Binnie Rail, MD at [facility] AMBULATORY OR   ??? Hx gyn       c-section   ??? Hx oophorectomy       right   ??? Hx vascular access     ??? Pr breast surgery procedure unlisted  '91     breast implants; replaced bilat implants 2011   ??? Hx breast reconstruction  11/28/2012     BREAST RECONSTRUCTION /C  INSERTION EXPANDER & ALLODERM performed by Ivar Bury, MD at [facility] AM[facility]UL[facility]ORY OR   ??? Hx heent       T&A   ??? Hx heent       right adrenal removed   ??? Pr abdomen surgery proc unlisted       hernia mesh repair   ??? Hx appendectomy     ??? Pr abdomen surgery proc unlisted       right adenal removed     History     Social History   ??? Marital Status: SINGLE     Spouse Name: N/A     Number of Children: N/A   ??? Years of Education: N/A     Social History Main Topics   ??? Smoking status: Never Smoker    ??? Smokeless tobacco: Never Used   ??? Alcohol Use: No   ??? Drug Use: No   ??? Sexually Active: Not on file     Other Topics Concern   ??? Not on file     Social History  Narrative   ??? No narrative on file     Family History   Problem Relation Age of Onset   ??? Alzheimer Mother    ??? Diabetes Mother    ??? Other Mother      COMBATIVE POST ANESTHESIA   ??? Heart Disease Father    ??? Diabetes Father    ??? Lung Disease Father      COPD   ??? Headache Sister    ??? Migraines Sister        Current Outpatient Prescriptions   Medication Sig Dispense Refill   ??? clonazepam (KLONOPIN) 0.5 mg tablet TAKE 1 TABLET BY MOUTH TWICE A DAY  60 tablet  2   ??? citalopram (CELEXA) 40 mg tablet Take 1 tablet by mouth daily.  30 tablet  4   ??? anastrozole (ARIMIDEX) 1 mg tablet Take 1 Tab by mouth daily.  30 Tab  11   ??? oxycodone-acetaminophen (PERCOCET) 5-325 mg per tablet Take 1 tablet by mouth every four (4) hours as needed for Pain.  50 tablet  0   ??? folic acid (FOLVITE) 1 mg tablet 1 mg daily.       ??? desloratadine (CLARINEX) 5 mg tablet Take 5 mg by mouth daily.       ??? diphenhydrAMINE (BENADRYL) 50 mg tablet Take 1 Tab by mouth nightly as needed for Itching.  30 Tab  0   ??? OTHER cranial prosthesis  1 Each  0   ??? DOCUSATE SODIUM (COLACE PO) Take 1 Tab by mouth daily.           Allergies   Allergen Reactions   ??? Aspirin Unknown (comments)     Gi bleeding   ??? Compazine [Prochlorperazine] Rash   ??? Keflex [Cephalexin] Rash   ??? Levaquin [Levofloxacin] Hives   ???  Nsaids (Non-Steroidal Anti-Inflammatory Drug) Swelling     Throat swells   ??? Prednisone Rash       Review of Systems    A comprehensive review of systems was performed and all systems were negative except for HPI and for the symptom report form, reviewed and scanned in.      Objective:  Physical Exam:  BP 129/79   Pulse 82   Temp(Src) 98 ??F (36.7 ??C) (Oral)   Resp 18   Ht 5\' 6"  (1.676 m)   Wt 127 lb 3.2 oz (57.698 kg)   BMI 20.54 kg/m2   SpO2 98%    General:  Alert, cooperative, no distress, appears stated age.   Head:  Normocephalic, without obvious abnormality, atraumatic.   Eyes:  Conjunctivae/corneas clear. PERRL, EOMs intact.   Throat: Lips, mucosa, and  tongue normal.    Neck: Supple, symmetrical, trachea midline, no adenopathy, thyroid: no enlargement/tenderness/nodules   Back:   Symmetric, no curvature. ROM normal. No CVA tenderness.   Lungs:   Clear to auscultation bilaterally.   Chest wall:  No tenderness or deformity.   Heart:  Regular rate and rhythm, S1, S2 normal, no murmur, click, rub or gallop.   Abdomen:   Soft, non-tender. Bowel sounds normal. No masses,  No organomegaly.   Extremities: Extremities normal, atraumatic, no cyanosis or edema.   Skin: No rash   Lymph nodes: Cervical, supraclavicular, and axillary nodes normal.   Neurologic: CNII-XII intact.       Diagnostic Imaging   Results for orders placed during the hospital encounter of 06/26/13   XR CHEST PA LAT    Narrative **Final Report**       ICD Codes / Adm.Diagnosis: 786.2   / Cough    Examination:  CR CHEST PA AND LATERAL  - [id number] - Jun 26 2013  2:14PM  Accession No:  [id number]  Reason:  cough      REPORT:  Exam:  2 view chest    Indication: Cough    Comparison: 04/22/13    PA and lateral views demonstrate normal heart size. There is no acute   process in the lung fields. The small dense nodule in left upper lung zone   consistent with a granuloma is unchanged.    Left breast prosthesis is in place. There are calcified granuloma in the   spleen. There are surgical clips in the right upper quadrant of the abdomen.   The osseous structures are unremarkable.        IMPRESSION:  1. No pneumonia  2. Old granulomatous disease              Signing/Reading Doctor: Syliva Overman [phone])    Approved: Syliva Overman ([id number])  Jun 26 2013  2:20PM                                     Results for orders placed in visit on 10/22/12   PET BREAST CANCER DIAGNOSIS   10/12/12 PET:  Negative for distant mets  10/22/12 brain MRI:  Negative    09/24/12 dexa L hip T -2.5; L spine T -2.3  osteoporosis    Lab Results  Lab Results   Component Value Date/Time    WBC 5.4 02/26/2013  9:35 AM    HGB 10.0 02/26/2013  9:35 AM     HCT 30.9 02/26/2013  9:35 AM    PLATELET 259  02/26/2013  9:35 AM    MCV 93.9 02/26/2013  9:35 AM       Lab Results   Component Value Date/Time    Sodium 144 02/05/2013  9:44 AM    Potassium 4.4 02/05/2013  9:44 AM    Chloride 105 02/05/2013  9:44 AM    CO2 29 02/05/2013  9:44 AM    Anion gap 10 02/05/2013  9:44 AM    Glucose 101 02/05/2013  9:44 AM    BUN 14 02/05/2013  9:44 AM    Creatinine 0.32 02/05/2013  9:44 AM    BUN/Creatinine ratio 44 02/05/2013  9:44 AM    GFR est AA >60 02/05/2013  9:44 AM    GFR est non-AA >60 02/05/2013  9:44 AM    Calcium 8.8 02/05/2013  9:44 AM    AST 22 02/05/2013  9:44 AM    Alk. phosphatase 136 02/05/2013  9:44 AM    Protein, total 6.2 02/05/2013  9:44 AM    Albumin 3.8 02/05/2013  9:44 AM    Globulin 2.4 02/05/2013  9:44 AM    A-G Ratio 1.6 02/05/2013  9:44 AM    ALT 52 02/05/2013  9:44 AM     Assessment/Plan:  62 y.o. female with a 0.5 cm left breast cancer, IDC with endocrine features, 1/14 LN +, gr 2, high risk mammaprint,  ER and PR +, HER 2 negative. PS 0    1. Breast cancer with neuroendocrine features, stage IIA, [id number]    With no further therapy, her RR is about 36%; endocrine therapy will decrease her RR to 19%; and 2nd gen chemo will decrease her RR to 12%; 3rd gen to 10%.  These #s may be higher with her high risk mammaprint.     Based on her pheo and now breast cancer history, I recommended genetic counseling -- referral to [facility] made at prior visit. Has seen them and they have performed BRCA testing -- negative.    No evidence of recurrence.  Will stop the anastrozole and see if her joint pains improve over the next week and if so, will change to exemestane 25 mg daily.    Has revision of left reconstruction scheduled for 09/16/13.  Have tentatively ordered a right mammogram for January 2015    2. Anxiety/Depression: continue celexa 40 mg daily.  She states this has improved her depression.   continue clonazepam 0.5 mg bid.    3. Osteoporosis:  We discussed the data from Rick Duff al, SABCS 2013, for the  meta-analysis for adjuvant bisphosphonate use in breast cancer.  We discussed that in postmenopausal women, it appears that the bisphosphate zolendronic acid, given as in ABCSG 12 at 4 mg IV q 6 months x 3 years, is beneficial in improving bone DFS and OS.  We discussed side effects such as ONJ and the need to avoid invasive dental procedures while on these medications, renal damage, and hypocalcemia.  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 Joint pains:  See above, will see if changing the anastrozole will help    > 25 minutes were spent with this patient with > 50% of that time spent in face to face counseling.    Follow-up Disposition:  Return in 2 months (on 09/30/2013).    Dortha Kern, MD

## 2013-07-30 NOTE — Progress Notes (Signed)
Emily [name] is a 62 y.o. female here for follow-up of breast cancer.

## 2013-07-30 NOTE — Patient Instructions (Addendum)
Right mammogram in January    Stop anastrozole for 1 week    Return in Dec

## 2013-07-30 NOTE — Progress Notes (Signed)
[facility]  [facility]ymphedema [facility] and [facility]  [address]  [address]  [place], [place]  [address]      LYmphedema Therapy  Visit: 6    [x]         Daily note               []        30 day/10th visit progress note    NAME: Emily [name]  DATE: 07/30/2013    GOALS   Short term goals   Time frame: 2-4 weeks   1. Instruct the patient to be independent with proper skin care to prevent future skin breakdown and decrease the potential risk for infections that are associated with Lymphedema. 07/30/2013 ongoing  2. Patient will be independent with a personal lymphedema exercise program to assist with the lymphatic flow and reduce L UE limb volume by 50-75%. 07/12/2013  Repeat bilat UE limb volume measurements with limb volume discrepancy 3.84%, improved from 8.85%.  See scanned graph.  3. Patient will understand the signs and symptoms of acute infection.   Long term goals   Time frame: 4-8 weeks   1. Patient will have knowledge of the compression options and acquire a safe and appropriate daytime and night time compression system to prevent re-accumulation of fluid. 06/12/2013  Pt has 2 sets of CCL 1 compression garments, which she reports tolerating wearing 8 hours/day. 07/12/2013  Pt received replacement compression garment from [employer] secondary to manufacturer's defect, with good fit noted.  2. Patient or family will be able to don/doff garments independently. Garment system effectiveness will be evaluated prior to discharge for better long-term management and outcomes.   06/26/2013 goal met  3. Improvement in UEFS by 10-12 points allowing pt to perform at shoulder level activities with 75% less pain. 06/26/2013 ongoing.  07/12/2013  Ongoing, improved by 9 points, pain persists.  07/30/2013 goal met regarding UEFS outcomes measure, however, pain persists at end range of motion L shoulder flex/abd/ER.  4. L shoulder ROM improved by 15-30 degrees at motions  with 75% less pain at end range.  06/26/2013 ongoing  5. To transition patient to independent restorative phase of CDT.           SUBJECTIVE REPORT:  Pt returns to treatment today stating she has continued with daily wear of compression sleeve/glove 8 hours/day.  Pt states she has continue exercise program as instructed, continuing with pain,  L shoulder ROM remains limited due to axillary pain.  Pt reports joint pain, with note that Dr. Elroy Channel holding anastrozole x 1 week, which pt also reports.  Pt reports she knows she is feeling better, however, continues with slow progress.  Pt expresses concern regarding tissue expander being encapsulated.   Pain:5-6/10 L axilla with shoulder flex/abd within available ROM.  Tenderness 4/10 inferior tissue expander, improved as noted in Manual therapy section below.            Gait: indep.  ADLs:  Indep, modifications due to limited shoulder ROM.  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  Question if full expander triggering condition, with pt c/o pain in region of expander, feeling it is heavy, reporting she feels  it is "too big."  Function: UEFS outcomes measure:  30/80, increased by 9 points since 05/22/2013;  Repeated UEFS outcomes measure today with 49/80 score, indicating 20% impairment.  Began MLD (manual lymphatic drainage) techniques with lotioning and skin care and range of motion exercise routine without props.    TREATMENT AND OBJECTIVE DATA SUMMARY:   Patient/Family Education:      Educated in skin care: Reviewed skin care principles to continue daily application of low pH lotion L UE using upward strokes to stimulate lymphatic vessels.       Bilat shoulder ROM at time of eval. Flex 92, Abd 88, ER 30, IR 50-Left  L shoulder flex 115; abd 100 post treatment today, guarded active movement persists.       Instructed in self MLD:   Written sequence given and reviewed  with patient as well as demonstration and instruction during MLD portion of the session.     Instructed in don/doff of compression system:   Compression garment donning principles and wear precautions.  Remove compression if the following occur:  1.  Numbness/tingling increased from what you experience without compression.  2.  Compromise in blood flow, with poor capillary refill, or color changes to toes/fingers.  3.  Onset of pain different from what you may already experience.  4.  Unexplained shortness of breath.  5.  Signs/symptoms of infection including flu-like symptoms, fever, redness, warmth, pain.         Therapeutic Exercise/Procedure 0 minutes   Treatment time:                Home program: Patient to perform daily to BID skin care, deep abdominal breathing 2 sets of 10, exercise routine pt to continue includes: bilat shoulder active ER x 10 in sidelying; pect stretch 1 min x 2 over foam roller with arms in neutral, no shoulder flex/abd at this time;  Active scapular depression paired with adduction x 10 reps;  neural glide shoulder abd to level of tolerance paired with elbow extension/wrist extension within limits of pain. To continue scalenes stretch x 2 holding 20 seconds each; First rib mob with sheet to stabilize x 2 holding 20 seconds each; SCM stretch holding mat table or chair paired with cervical extension x 2 holding 20 seconds.  Pt to continue:  Thoracic extension/lateral flex R, to continue 5 reps, within limits of pain, 2x/day as tolerated.     Rationale: Exercise will increase the lymph angiomotoricity and tissue pressure of the skin and thus decrease swelling.       Manual Therapy 57 minutes   Treatment time: 11:28am-12:25 pm  Area to decongest: L UE   Sequence used and effectiveness: Diaphragmatic breathing at initiation and conclusion of MLD sequence.  Short neck sequence.  Stim axillary R/inguinal lnn L.  Activated AAA/PAA/ L AI pathways.  Stimulated cephalic vasovasorum. Trunk sequence  with UE sequence to elbow performed.  Upper quadrant sequence including stim of perforating precollectors-- parasternals, paraspinals.  Medial to lateral technique upper quad anterior/posterior to sternum/spine, stimulating appropriate precollectors, 5 reps pathway, x 5 sets.  Focus on re-working AAA/PAA/L AI throughout treatment.  Pt was instructed to continue self MLD trunk sequence including short neck, stim R axillary/L inguinal lnn, activating AAA/L AI applying gentle skin stretch toward uninvolved lymph nodes, followed pt diaphragmatic breathing x 10 reps.  Pt instructed to have significant other activate PAA pathway when available.   Neural glides to address AWS, pairing shoulder abd, with pt tolerating to 90,  with full elbow/wrist extension x 10 reps.    STM pect major/minor, scalene, SCM, upper trap, subscap--trigger points noted in region.  Gentle mob of tissue expander medially and superiorly within tolerance of pt.  Therapist notes immediate improvement in palpation of vessel distal expander, with vessel less tender and prominent with palpation.  Note the same response with axillary cording, improvement in shoulder flex to 115 degrees.  Pt advised to continue this activity at home, gentle technique, with pt demonstrating in clinic after therapist instruction, good technique, not aggressive, with pain relief reported by pt with very subtle movement of expander medial and superior direction.  Advised to avoid pressure on expander and inferior mobilization of expander.      L shoulder abd 90, flex 90 initiation of treatment.  Post treatment, shoulder flex 115, abd 100.                    Skin/wound care/debridement: Intact.             Upper/Lower extremity compression: Continue as instructed.                       Kinesiotaping: Applied KT anchoring toward L inguinal lnn, fanning toward palpable vessel inferior aspect of tissue expander.  Additional piece applied anchoring L lumbar spine, fan toward  axilla.         TOTAL TREATMENT 57 mins       ASSESSMENT:   Treatment effectiveness and tolerance: Shoulder dysfunction L continues with visible cording noted in axilla x 2, noted today, however, improvement in shoulder ROM post treatment noted as documented above. Axillary cord extending to lateral upper quad and anterior upper quad inferior to tissue expander,tender to palpation, improved after gentle medial/superior mobilization of tissue expander. Symptoms remain consistent with Mondor's disease.  Shoulder flex/abd paired with thoracic extension increases symptoms along noted cording also consistent with Mondor's, however, improvement noted with these activities today. Due to pt c/o regarding full tissue expander, question role this may be playing in potentially compressing venous/lymphatic structure?  Pt reports exchange of expander scheduled for December, with plan to have smaller implant used. This may aide in resolving condition if benefit is not consistently achieved prior to this date. Pt may benefit from decreasing volume of expander if symptoms continue or worsen, with pt to consider discussing this with Dr. Claudie Fisherman as indicated.  Management of condition is manual lymph drainage, kinesiotape to promote venous/lymph return, mobilizing vessels using glide techniques, both of which have been a part of pt's treatment plan.  Continue thoracic extension/lateral flex R today, to address cording inferior to implant. Note improvement in outcomes measures as noted above. Pt reports significant benefit from wear of compression sleeve/gauntlet, stating UE symptoms are nearly absent when wearing garments, which aide lymphatic return, as well as assist in addressing any potential neuropathic symptoms.  Pt would benefit from trial of increasing frequency of PT visits at this clinic, however, due to distance pt lives from clinic, this is difficult for her. This has limited progress with treatment at this time, however,  noted AWS and shoulder ROM improving slowly.  Pt states she prefers treatment in this setting vs lymphedema center at [facility] based on her experience of former visits there.  Pt does have a vasopneumatic pump, which was ordered prior to her evaluation in this clinic.  Pt brought to previous appt, and at lowest pressure setting, was unable to tolerate use of device at that  time.  May have improved tolerance after reconstruction completed.  Pt was instructed in self MLD prior treatment which is a tool she can use at home to promote resolution of current condition.   Progress toward goals: Ongoing at this time with updates as noted above.       PLAN OF CARE:   Changes to the plan of care: Assess response to continuation of treatment as pt is able to get here, including HEP as noted above progressing as tolerated.  Continue MLD/manual therapy.  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 Anticipate 1-2 additional visits prior to reconstructive surgery, with further PT potentially indicated after the first of the year based on function and per MD order.   Frequency: Pt would benefit from 2x/week treatment in clinic, however, due to distance pt lives from clinic, a minimum of weekly treatment is recommended at this time.   Other: Anticipate d/c prior to scheduled reconstruction L breast, 1-2 visits.  May resume after surgery as indicated per MD order.       Theodoro Doing, PT, CLT

## 2013-08-06 NOTE — Telephone Encounter (Signed)
Pt called to speak with Dr. Elroy Channel regarding how she has been feeling since being off of anastrozole.

## 2013-08-06 NOTE — Telephone Encounter (Signed)
Called and spoke to patient who is requesting to talk to Dr. Elroy Channel about her symptoms since being off of Anastrozole.  Call transferred to Dr. Elroy Channel to discuss.

## 2013-08-06 NOTE — Telephone Encounter (Signed)
Joint pains are 75% improved  Off of anastrozole.  Will change to exemestane 25 mg daily.

## 2013-08-06 NOTE — Telephone Encounter (Signed)
Patient called again regarding an update. Please call [phone]

## 2013-08-08 NOTE — Progress Notes (Signed)
Called and completed prior authorization for Exemestane tablets.  Spoke to [facility] [phone] who stated that medication went through without difficulty.

## 2013-08-09 NOTE — Progress Notes (Signed)
HISTORY OF PRESENT ILLNESS  Emily [name] is a 62 y.o. female.  HPI Established pt here for a f/u of left Breast CA stage 2. S/P left mastectomy with implant reconstruction 11/28/12. Pt is still having left axillary pain due to webbing. Pt's last visit was 04/03/13, which pt had left axillary webbing. Pt has been seeing PT Theodoro Doing, but pt states the pain is the same. Pt saw Dr. Elroy Channel on 07/30/13, which he stopped Anastrozole due to joint pain. He prescribed pt Exemestane 25mg  on 08/06/13, but pt has not yet started. Pt states that she was walking her dog and fell on yesterday, which the impact was more on her left breast. Pt did place a bag of frozen vegetables on breast for an hour and a half. Pt is taking Percocet for relief of pain. Pt's pain is 6/10 for left breast today. Pt has completed XRT on 05/10/13. She started Zometa infusions on 07/10/13, which she will receive every 6 months x 3 yrs. Pt was BRCA tested, which results was negative.     ER+  PR+  HER2 neg         ROS    Physical Exam   Cardiovascular: Normal rate and normal heart sounds.    Pulmonary/Chest: Breath sounds normal. Right breast exhibits no inverted nipple, no mass, no nipple discharge, no skin change and no tenderness. Left breast exhibits no inverted nipple, no mass, no nipple discharge, no skin change and no tenderness. Breasts are symmetrical.       Lymphadenopathy:        Right cervical: No superficial cervical, no deep cervical and no posterior cervical adenopathy present.       Left cervical: No superficial cervical, no deep cervical and no posterior cervical adenopathy present.        Right axillary: No pectoral and no lateral adenopathy present.        Left axillary: No pectoral and no lateral adenopathy present.      ASSESSMENT and PLAN  Encounter Diagnoses   Name Primary?   ??? Breast cancer, stage 2, left Yes     Doing ok except for some axillary pain.  Will see back in 6 months.

## 2013-08-14 NOTE — Communication Body (Signed)
HISTORY OF PRESENT ILLNESS  Emily [name] is a 62 y.o. female.  HPI Established pt here for a f/u of left Breast CA stage 2. S/P left mastectomy with implant reconstruction 11/28/12. Pt is still having left axillary pain due to webbing. Pt's last visit was 04/03/13, which pt had left axillary webbing. Pt has been seeing PT Patricia Sanchez, but pt states the pain is the same. Pt saw Dr. Irvin on 07/30/13, which he stopped Anastrozole due to joint pain. He prescribed pt Exemestane 25mg on 08/06/13, but pt has not yet started. Pt states that she was walking her dog and fell on yesterday, which the impact was more on her left breast. Pt did place a bag of frozen vegetables on breast for an hour and a half. Pt is taking Percocet for relief of pain. Pt's pain is 6/10 for left breast today. Pt has completed XRT on 05/10/13. She started Zometa infusions on 07/10/13, which she will receive every 6 months x 3 yrs. Pt was BRCA tested, which results was negative.     ER+  PR+  HER2 neg         ROS    Physical Exam   Cardiovascular: Normal rate and normal heart sounds.    Pulmonary/Chest: Breath sounds normal. Right breast exhibits no inverted nipple, no mass, no nipple discharge, no skin change and no tenderness. Left breast exhibits no inverted nipple, no mass, no nipple discharge, no skin change and no tenderness. Breasts are symmetrical.       Lymphadenopathy:        Right cervical: No superficial cervical, no deep cervical and no posterior cervical adenopathy present.       Left cervical: No superficial cervical, no deep cervical and no posterior cervical adenopathy present.        Right axillary: No pectoral and no lateral adenopathy present.        Left axillary: No pectoral and no lateral adenopathy present.      ASSESSMENT and PLAN  Encounter Diagnoses   Name Primary?   ??? Breast cancer, stage 2, left Yes     Doing ok except for some axillary pain.  Will see back in 6 months.

## 2013-09-05 NOTE — Telephone Encounter (Signed)
Pt called. The order in the system is for a mammogram but she indicated it should be an MRI of the breast. She also wanted to know if she could get a prescription for a lower dose of Celexa-20mg . Please call [phone]

## 2013-09-05 NOTE — Telephone Encounter (Signed)
Called and left message for patient and informed her that new prescription for Celexa 20mg  would be sent in to her pharmacy.  Informed her that per Dr. Elroy Channel, MRI of breast would be ordered along with mammogram.  Instructed patient to call office with any further questions or concerns.

## 2013-09-05 NOTE — Telephone Encounter (Signed)
Incoming call received from patient who states that she does not want to have mammogram done because it is too painful and she will only have MRI done.  Patient also states that she thinks she needs to change from Celexa to a different medication because she is still "weepy" everyday and has decreased libido on the Celexa.  Patient would like for Dr. Elroy Channel to recommend a different anti-depressant.

## 2013-09-06 NOTE — Telephone Encounter (Signed)
Will stop celexa and switch to lexapro 20 mg daily.  rx in.

## 2013-09-06 NOTE — Telephone Encounter (Addendum)
09/06/13 returned patients phone call unable to reach patient- waiting on returned phone call.     09/06/13- Patient returned phone call she questioned what medication Dr.Irvin wanted to switch her, per MD phone call transferred to him.

## 2013-09-06 NOTE — Telephone Encounter (Signed)
Pt requested a reduced dosage on Celexa yesterday but the prescription has not been called in. Please call [phone]

## 2013-09-09 LAB — CBC WITH AUTOMATED DIFF
ABS. BASOPHILS: 0 10*3/uL (ref 0.0–0.1)
ABS. EOSINOPHILS: 0.1 10*3/uL (ref 0.0–0.4)
ABS. LYMPHOCYTES: 0.8 10*3/uL (ref 0.8–3.5)
ABS. MONOCYTES: 0.4 10*3/uL (ref 0.0–1.0)
ABS. NEUTROPHILS: 3.3 10*3/uL (ref 1.8–8.0)
BASOPHILS: 0 % (ref 0–1)
EOSINOPHILS: 2 % (ref 0–7)
HCT: 37 % (ref 35.0–47.0)
HGB: 12.2 g/dL (ref 11.5–16.0)
LYMPHOCYTES: 18 % (ref 12–49)
MCH: 29.3 PG (ref 26.0–34.0)
MCHC: 33 g/dL (ref 30.0–36.5)
MCV: 88.7 FL (ref 80.0–99.0)
MONOCYTES: 9 % (ref 5–13)
NEUTROPHILS: 71 % (ref 32–75)
PLATELET: 253 10*3/uL (ref 150–400)
RBC: 4.17 M/uL (ref 3.80–5.20)
RDW: 13.5 % (ref 11.5–14.5)
WBC: 4.7 10*3/uL (ref 3.6–11.0)

## 2013-09-09 NOTE — Other (Signed)
PATIENT GIVEN SURGICAL SITE INFECTION FAQS HANDOUT, DISCUSSED IMPORTANCE OF GOOD HAND HYGIENE, PATIENT VERBALIZED UNDERSTANDING.

## 2013-09-16 ENCOUNTER — Inpatient Hospital Stay: Payer: BLUE CROSS/BLUE SHIELD

## 2013-09-16 MED ADMIN — HYDROmorphone (PF) 15 mg/30 ml (DILAUDID) PCA: INTRAVENOUS | @ 18:00:00 | NDC 02420030164

## 2013-09-16 MED ADMIN — fentaNYL citrate (PF) injection: INTRAVENOUS | @ 15:00:00 | NDC 00409909332

## 2013-09-16 MED ADMIN — fentaNYL citrate (PF) injection: INTRAVENOUS | @ 14:00:00 | NDC 00409909332

## 2013-09-16 MED ADMIN — clindamycin (CLEOCIN) 600mg D5W 50mL IVPB (premix): INTRAVENOUS | @ 13:00:00 | NDC 00009337502

## 2013-09-16 MED ADMIN — neostigmine (PROSTIGMINE) injection: INTRAVENOUS | @ 14:00:00 | NDC 00517003325

## 2013-09-16 MED ADMIN — PHENYLephrine (NEOSYNEPHRINE) 10 mg in 0.9% sodium chloride 250 mL infusion: INTRAVENOUS | @ 14:00:00 | NDC 10019016339

## 2013-09-16 MED ADMIN — fentaNYL citrate (PF) injection: INTRAVENOUS | @ 13:00:00 | NDC 00409909332

## 2013-09-16 MED ADMIN — PHENYLephrine (NEOSYNEPHRINE) 10 mg in 0.9% sodium chloride 250 mL infusion: INTRAVENOUS | @ 13:00:00 | NDC 10019016339

## 2013-09-16 MED ADMIN — propofol (DIPRIVAN) 10 mg/mL injection: INTRAVENOUS | @ 13:00:00 | NDC 63323026920

## 2013-09-16 MED ADMIN — ondansetron (ZOFRAN) injection: INTRAVENOUS | @ 14:00:00 | NDC 23155037831

## 2013-09-16 MED ADMIN — bupivacaine liposome (PF) susp (EXPAREL) infiltration 10.64 mg: @ 14:00:00 | NDC 65250026620

## 2013-09-16 MED ADMIN — EPINEPHrine (PF) 1 mg, lidocaine (PF) 10 mg/mL (1 %) 50 mL in lactated ringers 1,000 mL solution: SUBCUTANEOUS | @ 14:00:00 | NDC 63323049257

## 2013-09-16 MED ADMIN — fentaNYL citrate (PF) injection 25 mcg: INTRAVENOUS | @ 15:00:00 | NDC 00641602701

## 2013-09-16 MED ADMIN — morphine injection: INTRAVENOUS | @ 15:00:00 | NDC 00409125830

## 2013-09-16 MED ADMIN — lidocaine (PF) (XYLOCAINE) 10 mg/mL (1 %) injection soln 0.1 mL: SUBCUTANEOUS | @ 14:00:00 | NDC 54569319800

## 2013-09-16 MED ADMIN — HYDROmorphone (PF) 15 mg/30 ml (DILAUDID) PCA: INTRAVENOUS | @ 16:00:00 | NDC 02420030164

## 2013-09-16 MED ADMIN — lactated ringers infusion: INTRAVENOUS | @ 13:00:00 | NDC 00338011704

## 2013-09-16 MED ADMIN — bupivacaine 0.25% -EPINEPHrine 1:200,000 (SENSORCAINE) 0.25 %-1:200,000 injection 25 mg: SUBCUTANEOUS | @ 14:00:00 | NDC 00409904202

## 2013-09-16 MED ADMIN — propofol (DIPRIVAN) 10 mg/mL injection: INTRAVENOUS | @ 14:00:00 | NDC 63323026920

## 2013-09-16 MED ADMIN — rocuronium (ZEMURON) injection: INTRAVENOUS | @ 13:00:00 | NDC 10139023505

## 2013-09-16 MED ADMIN — midazolam (VERSED) injection: INTRAVENOUS | @ 15:00:00 | NDC 25021065502

## 2013-09-16 MED ADMIN — lactated ringers infusion: INTRAVENOUS | @ 14:00:00 | NDC 00338011704

## 2013-09-16 MED ADMIN — midazolam (VERSED) injection: INTRAVENOUS | @ 13:00:00 | NDC 25021065502

## 2013-09-16 MED ADMIN — lactated ringers infusion: INTRAVENOUS | @ 18:00:00 | NDC 00409795309

## 2013-09-16 MED ADMIN — LORazepam (ATIVAN) injection 1 mg: INTRAVENOUS | @ 20:00:00 | NDC 00641604401

## 2013-09-16 MED ADMIN — glycopyrrolate (ROBINUL) injection: INTRAVENOUS | @ 14:00:00 | NDC 00143968201

## 2013-09-16 MED ADMIN — succinylcholine (ANECTINE) injection: INTRAVENOUS | @ 13:00:00 | NDC 00409662902

## 2013-09-16 MED ADMIN — HYDROmorphone (DILAUDID) tablet 4 mg: ORAL | @ 19:00:00 | NDC 68084042311

## 2013-09-16 MED ADMIN — bacitracin 50,000 Units, vancomycin 1 g, gentamicin 160 mg in sodium chloride irrigation 0.9 % 1,000 mL Irrigation: @ 14:00:00 | NDC 67457034000

## 2013-09-16 MED ADMIN — HYDROmorphone (PF) 15 mg/30 ml (DILAUDID) PCA: INTRAVENOUS | @ 19:00:00 | NDC 02420030164

## 2013-09-16 MED ADMIN — fentaNYL citrate (PF) injection 25 mcg: INTRAVENOUS | @ 16:00:00 | NDC 00641602701

## 2013-09-16 MED ADMIN — lidocaine (PF) (XYLOCAINE) 20 mg/mL (2 %) injection: INTRAVENOUS | @ 13:00:00 | NDC 63323049507

## 2013-09-16 MED FILL — QUELICIN 20 MG/ML INJECTION SOLUTION: 20 mg/mL | INTRAMUSCULAR | Qty: 10

## 2013-09-16 MED FILL — DIPRIVAN 10 MG/ML INTRAVENOUS EMULSION: 10 mg/mL | INTRAVENOUS | Qty: 20

## 2013-09-16 MED FILL — LIDOCAINE HCL 1 % (10 MG/ML) IJ SOLN: 10 mg/mL (1 %) | INTRAMUSCULAR | Qty: 60

## 2013-09-16 MED FILL — ONDANSETRON (PF) 4 MG/2 ML INJECTION: 4 mg/2 mL | INTRAMUSCULAR | Qty: 2

## 2013-09-16 MED FILL — QUELICIN 20 MG/ML INJECTION SOLUTION: 20 mg/mL | INTRAMUSCULAR | Qty: 6

## 2013-09-16 MED FILL — XYLOCAINE-MPF 20 MG/ML (2 %) INJECTION SOLUTION: 20 mg/mL (2 %) | INTRAMUSCULAR | Qty: 5

## 2013-09-16 MED FILL — NEOSTIGMINE METHYLSULFATE 1 MG/ML INJECTION: 1 mg/mL | INTRAMUSCULAR | Qty: 3

## 2013-09-16 MED FILL — ROCURONIUM 10 MG/ML IV: 10 mg/mL | INTRAVENOUS | Qty: 5

## 2013-09-16 MED FILL — MORPHINE 4 MG/ML SYRINGE: 4 mg/mL | INTRAMUSCULAR | Qty: 1

## 2013-09-16 MED FILL — FENTANYL CITRATE (PF) 50 MCG/ML IJ SOLN: 50 mcg/mL | INTRAMUSCULAR | Qty: 2

## 2013-09-16 MED FILL — ROCURONIUM 10 MG/ML IV: 10 mg/mL | INTRAVENOUS | Qty: 3

## 2013-09-16 MED FILL — MIDAZOLAM 1 MG/ML IJ SOLN: 1 mg/mL | INTRAMUSCULAR | Qty: 2

## 2013-09-16 MED FILL — PHENYLEPHRINE IN 0.9 % SODIUM CL (40 MCG/ML) IV SYRINGE: 0.4 mg/10 mL (40 mcg/mL) | INTRAVENOUS | Qty: 480

## 2013-09-16 MED FILL — GLYCOPYRROLATE 0.2 MG/ML IJ SOLN: 0.2 mg/mL | INTRAMUSCULAR | Qty: 0.5

## 2013-09-16 MED FILL — XYLOCAINE-MPF 20 MG/ML (2 %) INJECTION SOLUTION: 20 mg/mL (2 %) | INTRAMUSCULAR | Qty: 100

## 2013-09-16 MED FILL — EPINEPHRINE (PF) 1 MG/ML INJECTION: 1 mg/mL ( mL) | INTRAMUSCULAR | Qty: 1

## 2013-09-16 MED FILL — BD POSIFLUSH NORMAL SALINE 0.9 % INJECTION SYRINGE: INTRAMUSCULAR | Qty: 30

## 2013-09-16 MED FILL — EXPAREL (PF) 1.3 % (13.3 MG/ML) SUSPENSION FOR LOCAL INFILTRATION: 1.3 % (13.3 mg/mL) | Qty: 20

## 2013-09-16 MED FILL — PHENYLEPHRINE IN 0.9 % SODIUM CL (40 MCG/ML) IV SYRINGE: 0.4 mg/10 mL (40 mcg/mL) | INTRAVENOUS | Qty: 10

## 2013-09-16 MED FILL — NEOSTIGMINE METHYLSULFATE 1 MG/ML INJECTION: 1 mg/mL | INTRAMUSCULAR | Qty: 10

## 2013-09-16 MED FILL — GLYCOPYRROLATE 0.2 MG/ML IJ SOLN: 0.2 mg/mL | INTRAMUSCULAR | Qty: 3

## 2013-09-16 MED FILL — PHENYLEPHRINE 10 MG/ML INJECTION: 10 mg/mL | INTRAMUSCULAR | Qty: 1

## 2013-09-16 MED FILL — CLEOCIN 600 MG/50 ML IN 5 % DEXTROSE INTRAVENOUS PIGGYBACK: 600 mg/50 mL | INTRAVENOUS | Qty: 50

## 2013-09-16 NOTE — Op Note (Signed)
Name:      Emily [name], Emily [name]                                          Surgeon:        Ivar Bury, MD  Account #: [id number]                 Surgery Date:   09/16/2013  DOB:       December 05, 1950  Age:       62                           Location:                                 OPERATIVE REPORT      PREOPERATIVE DIAGNOSES  1. History of left breast cancer.  2. Capsular contracture of left tissue expander.  3. Asymmetry of reconstructed breast to native breast.    POSTOPERATIVE DIAGNOSES  1. History of left breast cancer.  2. Capsular contracture of left tissue expander.  3. Asymmetry of reconstructed breast to native breast.    PROCEDURES PERFORMED  1. Remove and replace left tissue expander for silicone implant on the  left.  2. Placement of right breast saline implant for silicone implant for  symmetry on the right.  3. Revision of left reconstructed breast.  4. Capsulorrhaphy, contralateral breast.  5. Fat grafting to bilateral breasts.  6. Subtotal capsulectomy, left breast.    SURGEON: Ivar Bury, MD    ANESTHESIA: General.    ESTIMATED BLOOD LOSS: Minimal.    DRAINS AND TUBES: None.    SPECIMENS REMOVED: Left mastectomy scar and capsule.    OPERATIVE FINDINGS:  Tight left breast capsule and right breast implant  malposition.    COMPLICATIONS: No complications.    IMPLANTS USED: On the right breast, I used a smooth, round 500 mL high-profile  gel implant, [employer]; the serial number [id number]. On the  Left side, we used  400 mL moderate profile plus serial number [id number].    DESCRIPTION OF PROCEDURE: The patient is a 62 year old white female status  post left total mastectomy and radiation for breast cancer. She is here  today for removal of the tissue expander and placement of a silicone  implant as well as for some other procedures to get more symmetry between  the two breasts. Prior to procedure, risks and benefits of surgery were  discussed with the patient, and informed consent was obtained.    On  the day of surgery, the patient was seen in the preoperative holding  area. She was marked in the standing position. She was taken to the  operating room and placed supine on the operating room table. After the  induction of general anesthesia, the arms secured at 90 degrees to arm  boards, and chest was prepped and draped in the standard fashion. Following  a preoperative time-out confirming the patient's identification and  procedure to be performed, I began the case.    I infiltrated each of the lateral thighs with Klein's solution.    Then, starting on the left breast, I excised the breast scar and removed  the tissue expander. I then did a subtotal capsulotomy, removing all the  capsule on the undersurface  of the pectoralis as well as releasing the  capsule and going further up underneath the clavicle in the subpectoral  plane. I then placed a sizer in the space. A 500 mL high-profile sizer fit  in nicely here to fill in the space.    I then turned my attention to the right breast. The right breast was more  laterally displaced. I opened up the old mastectomy scar and found that  there had been an attempted capsulorrhaphy here. The implant was removed. I  then released the breast more in the superior medial and superior planes. I  then placed different sizers. The size that corresponded best with the 500  mL on the left was a 400 mL moderate profile plus on the right.    I then removed the implant, cauterized the capsule laterally and  inferiorly, and performed a capsulorrhaphy with a double running layer of 0  Nurolon along the inferior and lateral portions to medialize the breast.  Once this was accomplished, I put the sizer back in the space and sat the  patient upright, and the symmetry was much better. I then irrigated out  both pockets with triple antibiotic irrigation.    During this time, I then harvested fat with low pressure suction from each  lateral thigh, aspirating 120 mL of microaspirate from  each thigh and  allowed this to separate by gravity on the back table. After separation, I  decanted off the supernatant, and then I injected some fat into the right  breast in the inferolateral and inferior proportions adjacent areas where I  performed the capsulorrhaphy in order to smooth out this appearance. In  addition, I placed fat through stab incisions in multiple passes with the  standard Coleman micro-droplet technique, infiltrating fat in the plane  between the pectoralis muscle and the skin throughout the entire breast  flap, both superiorly and laterally. She had some axillary webbing. I also  grafted some fat in here as well.    Once this was accomplished, I then using a Keller funnel placed the 2  implants, I placed the 500 mL high-profile implant on the left and then  reapproximated the capsule using a running 2-0 Vicryl, deep dermis with  running 2-0 Vicryl and running subcuticular 3-0 Monocryl, and reinforced  this with Dermabond.    On the right breast, I placed with the Keller funnel 400 mL moderate  profile plus silicone implant, and on placing this, I then reapproximated  the capsule using a running 2-0 Vicryl, deep dermis with running 2-0 Vicryl  and subcuticular running 3-0 Monocryl, once again reinforced with  Dermabond.    The access incisions made to aspirate the fat as well as inject the fat  were closed using interrupted 4-0 plain gut.    The patient was then extubated and brought from the operating room to the  PACU in stable condition.          Ivar Bury, MD    cc:   Ivar Bury, MD        SM[facility]/wmx; D: 09/16/2013 12:16 P; T: 09/16/2013 02:07 P; Doc# [id number]; Job#  [id number]

## 2013-09-16 NOTE — Op Note (Signed)
Name:      Emily [name], Emily [name]                                          Surgeon:        Kodey Xue M Barron Vanloan, MD  Account #: [id number]                 Surgery Date:   09/16/2013  DOB:       03/11/1951  Age:       62                           Location:                                 OPERATIVE REPORT      PREOPERATIVE DIAGNOSES  1. History of left breast cancer.  2. Capsular contracture of left tissue expander.  3. Asymmetry of reconstructed breast to native breast.    POSTOPERATIVE DIAGNOSES  1. History of left breast cancer.  2. Capsular contracture of left tissue expander.  3. Asymmetry of reconstructed breast to native breast.    PROCEDURES PERFORMED  1. Remove and replace left tissue expander for silicone implant on the  left.  2. Placement of right breast saline implant for silicone implant for  symmetry on the right.  3. Revision of left reconstructed breast.  4. Capsulorrhaphy, contralateral breast.  5. Fat grafting to bilateral breasts.  6. Subtotal capsulectomy, left breast.    SURGEON: Kishaun Erekson M Mabelle Mungin, MD    ANESTHESIA: General.    ESTIMATED BLOOD LOSS: Minimal.    DRAINS AND TUBES: None.    SPECIMENS REMOVED: Left mastectomy scar and capsule.    OPERATIVE FINDINGS:  Tight left breast capsule and right breast implant  malposition.    COMPLICATIONS: No complications.    IMPLANTS USED: On the right breast, I used a smooth, round 500 mL high-profile  gel implant, [employer]; the serial number [id number]. On the  Left side, we used  400 mL moderate profile plus serial number [id number].    DESCRIPTION OF PROCEDURE: The patient is a 52-year-old white female status  post left total mastectomy and radiation for breast cancer. She is here  today for removal of the tissue expander and placement of a silicone  implant as well as for some other procedures to get more symmetry between  the two breasts. Prior to procedure, risks and benefits of surgery were  discussed with the patient, and informed consent was obtained.    On  the day of surgery, the patient was seen in the preoperative holding  area. She was marked in the standing position. She was taken to the  operating room and placed supine on the operating room table. After the  induction of general anesthesia, the arms secured at 90 degrees to arm  boards, and chest was prepped and draped in the standard fashion. Following  a preoperative time-out confirming the patient's identification and  procedure to be performed, I began the case.    I infiltrated each of the lateral thighs with Klein's solution.    Then, starting on the left breast, I excised the breast scar and removed  the tissue expander. I then did a subtotal capsulotomy, removing all the  capsule on the undersurface   of the pectoralis as well as releasing the  capsule and going further up underneath the clavicle in the subpectoral  plane. I then placed a sizer in the space. A 500 mL high-profile sizer fit  in nicely here to fill in the space.    I then turned my attention to the right breast. The right breast was more  laterally displaced. I opened up the old mastectomy scar and found that  there had been an attempted capsulorrhaphy here. The implant was removed. I  then released the breast more in the superior medial and superior planes. I  then placed different sizers. The size that corresponded best with the 500  mL on the left was a 400 mL moderate profile plus on the right.    I then removed the implant, cauterized the capsule laterally and  inferiorly, and performed a capsulorrhaphy with a double running layer of 0  Nurolon along the inferior and lateral portions to medialize the breast.  Once this was accomplished, I put the sizer back in the space and sat the  patient upright, and the symmetry was much better. I then irrigated out  both pockets with triple antibiotic irrigation.    During this time, I then harvested fat with low pressure suction from each  lateral thigh, aspirating 120 mL of microaspirate from  each thigh and  allowed this to separate by gravity on the back table. After separation, I  decanted off the supernatant, and then I injected some fat into the right  breast in the inferolateral and inferior proportions adjacent areas where I  performed the capsulorrhaphy in order to smooth out this appearance. In  addition, I placed fat through stab incisions in multiple passes with the  standard Coleman micro-droplet technique, infiltrating fat in the plane  between the pectoralis muscle and the skin throughout the entire breast  flap, both superiorly and laterally. She had some axillary webbing. I also  grafted some fat in here as well.    Once this was accomplished, I then using a Keller funnel placed the 2  implants, I placed the 500 mL high-profile implant on the left and then  reapproximated the capsule using a running 2-0 Vicryl, deep dermis with  running 2-0 Vicryl and running subcuticular 3-0 Monocryl, and reinforced  this with Dermabond.    On the right breast, I placed with the Keller funnel 400 mL moderate  profile plus silicone implant, and on placing this, I then reapproximated  the capsule using a running 2-0 Vicryl, deep dermis with running 2-0 Vicryl  and subcuticular running 3-0 Monocryl, once again reinforced with  Dermabond.    The access incisions made to aspirate the fat as well as inject the fat  were closed using interrupted 4-0 plain gut.    The patient was then extubated and brought from the operating room to the  PACU in stable condition.          Elonna Mcfarlane M Lyndy Russman, MD    cc:   Kenzli Barritt M Kyria Bumgardner, MD        [facility]/wmx; D: 09/16/2013 12:16 [name]; T: 09/16/2013 02:07 [name]; Doc# [id number]; Job#  [id number]

## 2013-09-16 NOTE — Other (Signed)
TRANSFER - OUT REPORT:    Verbal report given to [name](name) on Emily [name]  being transferred to 354(unit) for routine post - op       Report consisted of patient???s Situation, Background, Assessment and   Recommendations(SBAR).     Time Pre op antibiotic [id number]  Anesthesia Stop time: 0949    Information from the following report(s) SBAR, Kardex, OR Summary, Procedure Summary, Intake/Output, MAR, Recent Results and Med Rec Status was reviewed with the receiving nurse.    Opportunity for questions and clarification was provided.     Is the patient on 02? YES       L/Min 2       Other     Is the patient on a monitor? NO    Is the nurse transporting with the patient? NO    Surgical Waiting Area notified of patient's transfer from PACU? YES      The following personal items collected during your admission accompanied patient upon transfer:   Dental Appliance: Dental Appliances:  (crowns)  Vision:    Hearing Aid:    Jewelry: Jewelry: Bracelet;Sent home  Clothing: Clothing:  (clothing to PACU)  Other Valuables:    Valuables sent to safe:        Clothing sent to floor.

## 2013-09-16 NOTE — Anesthesia Post-Procedure Evaluation (Signed)
Post-Anesthesia Evaluation and Assessment    Patient: Emily [name] MRN: [id number]  SSN: [id number]    Date of Birth: 10/15/1950  Age: 62 y.o.  Sex: female       Cardiovascular Function/Vital Signs  Visit Vitals   Item Reading   ??? BP 106/62   ??? Pulse 83   ??? Temp 36.6 ??C (97.8 ??F)   ??? Resp 16   ??? Ht 5' 6.5" (1.689 m)   ??? Wt 59.421 kg (131 lb)   ??? BMI 20.83 kg/m2   ??? SpO2 96%       Patient is status post general anesthesia for Procedure(s):  REMOVAL AND REPLACEMENT LEFT BREAST IMPLANT TISSUE EXPANDERS WITH SUBTOTAL CAPSULECTOMY, RIGHT BREAST IMPLANT REMOVAL AND REPLACEMENT FOR SYMMETRY/FAT GRAFTING TO BILATERAL BREASTS.    Nausea/Vomiting: None    Postoperative hydration reviewed and adequate.    Pain:  Pain Scale 1: Numeric (0 - 10) (09/16/13 1403)  Pain Intensity 1: 10 (09/16/13 1403)   Managed    Neurological Status:   Neuro (WDL): Within Defined Limits (09/16/13 1114)  Neuro  Neurologic State: Other (Comment) (anxious) (09/16/13 0950)   At baseline    Mental Status and Level of Consciousness: Alert and oriented     Pulmonary Status:   O2 Device: Nasal cannula (09/16/13 1157)   Adequate oxygenation and airway patent    Complications related to anesthesia: None    Post-anesthesia assessment completed. No concerns    Signed By: Dorann Ou, MD     September 16, 2013

## 2013-09-16 NOTE — Other (Signed)
Emily [name] attempted to update contact person. No one is checked in at this time.

## 2013-09-16 NOTE — H&P (Signed)
Surgery History and Physical    Subjective:      Emily [name] is a 62 y.o. Caucasian female who presents for second stage breast reconstruction with remove and replace tissue expander on the left breast, right remove and replace implant for symmetry, fat grafting and revision of left breast..    Patient Active Problem List    Diagnosis Date Noted   ??? Breast cancer, stage 2 10/22/2012     Past Medical History   Diagnosis Date   ??? Cancer      left breast  cancer   ??? Anxiety    ??? Arthritis    ??? Nausea & vomiting    ??? Concussion 2011   ??? Chronic tension headaches    ??? Headache      OCCAS   ??? Other ill-defined conditions      PHEOCHROMOCYTOMA   ??? Other and unspecified symptoms and signs involving general sensations and perceptions      HIVES RECENTLY- WAS IN ER Feb 19, 2013   ??? Lymphedema of arm      left      Past Surgical History   Procedure Laterality Date   ??? Hx orthopaedic       bilateral shoulder arthroscopy   ??? Hx modified radical mastectomy  11/28/2012     LEFT BREAST MODIFIED RADICAL MASTECTOMY WITH AXILLARY DISSECTION W/ RECONSTRUCTION LEFT BREAST WITH TISSUE EXPANDER AND ALLODERM;PORTACATH INSERTION performed by Binnie Rail, MD at [facility] AMBULATORY [facility]   ??? Hx gyn       c-section   ??? Hx oophorectomy       right   ??? Hx heent       T&A   ??? Hx heent       right adrenal removed   ??? Pr abdomen surgery proc unlisted       hernia mesh repair   ??? Hx appendectomy     ??? Pr abdomen surgery proc unlisted       right adenal removed   ??? Hx vascular access       PORT, THEN REMOVED   ??? Pr breast surgery procedure unlisted  '91     breast implants; replaced bilat implants 2011   ??? Hx breast reconstruction  11/28/2012     BREAST RECONSTRUCTION /C  INSERTION EXPANDER & ALLODERM performed by Ivar Bury, MD at [facility] AM[facility]UL[facility]   ??? Hx mastectomy Left       History   Substance Use Topics   ??? Smoking status: Never Smoker    ??? Smokeless tobacco: Never Used   ??? Alcohol Use: No      Family History   Problem Relation Age of  Onset   ??? Alzheimer Mother    ??? Diabetes Mother    ??? Other Mother      COMBATIVE POST ANESTHESIA   ??? Heart Disease Father    ??? Diabetes Father    ??? Lung Disease Father      COPD   ??? Headache Sister    ??? Migraines Sister    ??? Anesth Problems Neg Hx       Prior to Admission medications    Medication Sig Start Date End Date Taking? Authorizing Provider   escitalopram oxalate (LEXAPRO) 20 mg tablet Take 1 tablet by mouth daily. 09/06/13  Yes Leotis Shames, MD   clonazepam (KLONOPIN) 0.5 mg tablet TAKE 1 TABLET BY MOUTH TWICE A DAY 07/17/13  Yes Leotis Shames,  MD   exemestane (AROMASIN) 25 mg tablet Take 1 tablet by mouth daily for 90 days. 08/06/13 11/04/13  Leotis Shames, MD   oxycodone-acetaminophen (PERCOCET) 5-325 mg per tablet Take 1 tablet by mouth every four (4) hours as needed for Pain. 07/30/13   Domenica Fail, NP   desloratadine (CLARINEX) 5 mg tablet Take 5 mg by mouth daily.    Historical Provider   diphenhydrAMINE (BENADRYL) 50 mg tablet Take 1 Tab by mouth nightly as needed for Itching. 02/26/13   Kenna Gilbert, NP   OTHER cranial prosthesis 01/15/13   Leotis Shames, MD   DOCUSATE SODIUM (COLACE PO) Take 1 Tab by mouth daily.    Historical Provider     Allergies   Allergen Reactions   ??? Aspirin Unknown (comments)     Gi bleeding   ??? Compazine [Prochlorperazine] Rash   ??? Keflex [Cephalexin] Rash   ??? Levaquin [Levofloxacin] Hives     DENIES ALLERGY   ??? Nsaids (Non-Steroidal Anti-Inflammatory Drug) Swelling     Throat swells   ??? Prednisone Rash     DENIES ALLERGY         Review of Systems:    A comprehensive review of systems was negative except for that written in the History of Present Illness.    Objective:     Patient Vitals for the past 8 hrs:   BP Temp Pulse Resp SpO2 Height Weight   09/16/13 0657 133/50 mmHg 97.8 ??F (36.6 ??C) 75 14 100 % 5' 6.5" (1.689 m) 59.421 kg (131 lb)       Temp (24hrs), Avg:97.8 ??F (36.6 ??C), Min:97.8 ??F (36.6 ??C), Max:97.8 ??F (36.6 ??C)      Physical Exam:  General:   Alert, cooperative, no distress, appears stated age.   Eyes:  Conjunctivae/corneas clear. PERRL, EOMs intact. Fundi benign   Ears:  Normal TMs and external ear canals both ears.   Nose: Nares normal. Septum midline. Mucosa normal. No drainage or sinus tenderness.   Mouth/Throat: Lips, mucosa, and tongue normal. Teeth and gums normal.   Neck: Supple, symmetrical, trachea midline, no adenopathy, thyroid: no enlargment/tenderness/nodules, no carotid bruit and no JVD.   Back:   Symmetric, no curvature. ROM normal. No CVA tenderness.   Lungs:   Clear to auscultation bilaterally.   Heart:  Regular rate and rhythm, S1, S2 normal, no murmur, click, rub or gallop.   Abdomen:   Soft, non-tender. Bowel sounds normal. No masses,  No organomegaly.   Extremities: Extremities normal, atraumatic, no cyanosis or edema.   Pulses: 2+ and symmetric all extremities.   Skin: Skin color, texture, turgor normal. No rashes or lesions   Lymph nodes: Cervical, supraclavicular, and axillary nodes normal.   Neurologic: CNII-XII intact. Normal strength, sensation and reflexes throughout.       Labs: No results found for this or any previous visit (from the past 24 hour(s)).    Data Review:    CBC:   Lab Results   Component Value Date/Time    WBC 4.7 09/09/2013 10:19 AM    RBC 4.17 09/09/2013 10:19 AM    HGB 12.2 09/09/2013 10:19 AM    HCT 37.0 09/09/2013 10:19 AM    PLATELET 253 09/09/2013 10:19 AM        Assessment:     Active Problems:    * No active hospital problems. *      Plan:     Remove and replace tissue expander, revision of fold,  on left  Right remove and replace, capsulorraphy    Signed By: Ivar Bury, MD     September 16, 2013

## 2013-09-16 NOTE — Brief Op Note (Signed)
BRIEF OPERATIVE NOTE    Date of Procedure: 09/16/2013   Preoperative Diagnosis: BREAST CANCER  Postoperative Diagnosis: BREAST CANCER/BREAST ASYMMETRY    Procedure(s):  REMOVAL AND REPLACEMENT LEFT BREAST IMPLANT TISSUE EXPANDERS WITH SUBTOTAL CAPSULECTOMY, RIGHT BREAST IMPLANT REMOVAL AND REPLACEMENT FOR SYMMETRY/FAT GRAFTING TO BILATERAL BREASTS  Surgeon(s) and Role:     * Ivar Bury, MD - Primary  Anesthesia: General   Estimated Blood Loss: minimal   Specimens: ID Type Source Tests Collected by Time Destination   1 : Left Breast Mastectomy Scar and Capsule Fresh Tissue  Ivar Bury, MD 09/16/2013 [phone] Pathology      Findings: tight left capsule, right breast implant malposition   Complications: none  Implants:   Implant Name Type Inv. Item Serial No. Manufacturer Lot No. LRB No. Used Action   IMPL BRST RND HP GEL --  - [id number]  [id number] [employer] [id number] Left 1 Implanted   IMPL BRST RND MP+ GEL --  - [id number]   [id number] [employer] [id number] Right 1 Implanted

## 2013-09-16 NOTE — Anesthesia Pre-Procedure Evaluation (Signed)
Anesthetic History     PONV         Review of Systems / Medical History  Patient summary reviewed, nursing notes reviewed and pertinent labs reviewed    Pulmonary  Within defined limits               Neuro/Psych         Psychiatric history     Cardiovascular  Within defined limits                   GI/Hepatic/Renal  Within defined limits                Endo/Other        Arthritis and cancer     Other Findings            Physical Exam    Airway  Mallampati: II  TM Distance: 4 - 6 cm  Neck ROM: normal range of motion   Mouth opening: Normal     Cardiovascular  Regular rate and rhythm,  S1 and S2 normal,  no murmur, click, rub, or gallop             Dental    Dentition: Caps/crowns     Pulmonary  Breath sounds clear to auscultation               Abdominal  GI exam deferred       Other Findings            Anesthetic Plan    ASA: 2  Anesthesia type: general          Induction: Intravenous  Anesthetic plan and risks discussed with: Patient

## 2013-09-17 MED ADMIN — oxyCODONE-acetaminophen (PERCOCET) 5-325 mg per tablet 2 tablet: ORAL | @ 22:00:00 | NDC 00406051223

## 2013-09-17 MED ADMIN — oxyCODONE-acetaminophen (PERCOCET) 5-325 mg per tablet 2 tablet: ORAL | @ 14:00:00 | NDC 00406051223

## 2013-09-17 MED ADMIN — HYDROmorphone (PF) 15 mg/30 ml (DILAUDID) PCA: INTRAVENOUS | @ 01:00:00 | NDC 02420030164

## 2013-09-17 MED ADMIN — sodium chloride 0.9 % injection: @ 08:00:00 | NDC 00409488810

## 2013-09-17 MED ADMIN — ondansetron (ZOFRAN) injection 4 mg: INTRAVENOUS | @ 06:00:00 | NDC 23155037831

## 2013-09-17 MED ADMIN — HYDROmorphone (DILAUDID) tablet 4 mg: ORAL | @ 05:00:00 | NDC 68084042311

## 2013-09-17 MED ADMIN — diazepam (VALIUM) tablet 5 mg: ORAL | @ 03:00:00 | NDC 63739007310

## 2013-09-17 MED ADMIN — diazepam (VALIUM) tablet 5 mg: ORAL | @ 16:00:00 | NDC 63739007310

## 2013-09-17 MED ADMIN — LORazepam (ATIVAN) injection 1 mg: INTRAVENOUS | @ 08:00:00 | NDC 00641604401

## 2013-09-17 MED ADMIN — oxyCODONE-acetaminophen (PERCOCET) 5-325 mg per tablet 2 tablet: ORAL | @ 18:00:00 | NDC 00406051223

## 2013-09-17 MED ADMIN — sodium chloride 0.9 % injection: @ 06:00:00 | NDC 00409488810

## 2013-09-17 MED ADMIN — HYDROmorphone (DILAUDID) tablet 4 mg: ORAL | @ 01:00:00 | NDC 68084042311

## 2013-09-17 MED ADMIN — diazepam (VALIUM) tablet 5 mg: ORAL | @ 23:00:00 | NDC 63739007310

## 2013-09-17 MED FILL — SODIUM CHLORIDE 0.9 % INJECTION: INTRAMUSCULAR | Qty: 10

## 2013-09-17 NOTE — Progress Notes (Signed)
Pt with pain control issues still  Breasts are soft  No evidence of hematoma  Will try to find something that works

## 2013-09-17 NOTE — Other (Signed)
Chart reviewed for medical necessity, will follow for potential discharge needs.  Senna Lape, RN

## 2013-09-17 NOTE — Other (Signed)
Interdisciplinary team rounds were held 09/17/2013 with the following team members:  Nursing, Care Management, and CCL.    Plan of care discussed. See clinical pathway and/or care plan for interventions and desired outcomes.    HMoneyhan ,RN

## 2013-09-18 MED ADMIN — diazepam (VALIUM) tablet 5 mg: ORAL | @ 15:00:00 | NDC 63739007310

## 2013-09-18 MED ADMIN — oxyCODONE-acetaminophen (PERCOCET) 5-325 mg per tablet 2 tablet: ORAL | @ 09:00:00 | NDC 68084035511

## 2013-09-18 MED ADMIN — HYDROmorphone (DILAUDID) tablet 4 mg: ORAL | @ 14:00:00 | NDC 68084042311

## 2013-09-18 MED ADMIN — oxyCODONE-acetaminophen (PERCOCET) 5-325 mg per tablet 2 tablet: ORAL | @ 02:00:00 | NDC 00406051223

## 2013-09-18 MED ADMIN — oxyCODONE-acetaminophen (PERCOCET) 5-325 mg per tablet 2 tablet: ORAL | @ 06:00:00 | NDC 00406051223

## 2013-09-18 MED ADMIN — diazepam (VALIUM) tablet 5 mg: ORAL | @ 05:00:00 | NDC 63739007310

## 2013-09-18 NOTE — Discharge Summary (Signed)
Physician Discharge Summary     Patient ID:  Emily [name]  [id number]  62 y.o.  27-Feb-1951    Admit Date: 09/16/2013    Discharge Date: 09/18/2013    Admission Diagnoses: BREAST CANCER  BREAST CANCER    Discharge Diagnoses:    Problem List as of 09/18/2013 Date Reviewed: 09/16/2013        ICD-9-CM Class Noted - Resolved    Breast cancer, stage 2 174.9  10/22/2012 - Present        RESOLVED: Seroma 998.13  02/20/2013 - 04/15/2013               Admission Condition: Good    Discharge Condition: Good    Last Procedure: Procedure(s):  REMOVAL AND REPLACEMENT LEFT BREAST IMPLANT TISSUE EXPANDERS WITH SUBTOTAL CAPSULECTOMY, RIGHT BREAST IMPLANT REMOVAL AND REPLACEMENT FOR SYMMETRY/FAT GRAFTING TO BILATERAL BREASTS    Hospital Course:   Hospital course was complicated by difficult pain control issues, which added 2 days to the patient's length of stay.      Consults: None    Significant Diagnostic Studies: none    Disposition: home    Patient Instructions:   Current Discharge Medication List      START taking these medications    Details   diazepam (VALIUM) 5 mg tablet Take 1 tablet by mouth every six (6) hours as needed (spasm).  Qty: 40 tablet, Refills: 0      !! oxyCODONE-acetaminophen (PERCOCET) 5-325 mg per tablet Take 2 tablets by mouth every four (4) hours as needed.  Qty: 80 tablet, Refills: 0      HYDROmorphone (DILAUDID) 2 mg tablet Take 1 tablet by mouth every four (4) hours as needed for Pain.  Qty: 50 tablet, Refills: 0       !! - Potential duplicate medications found. Please discuss with provider.      CONTINUE these medications which have NOT CHANGED    Details   escitalopram oxalate (LEXAPRO) 20 mg tablet Take 1 tablet by mouth daily.  Qty: 30 tablet, Refills: 4      !! oxycodone-acetaminophen (PERCOCET) 5-325 mg per tablet Take 1 tablet by mouth every four (4) hours as needed for Pain.  Qty: 50 tablet, Refills: 0    Associated Diagnoses: Pain; Breast cancer, stage 2, left      clonazepam (KLONOPIN) 0.5 mg  tablet TAKE 1 TABLET BY MOUTH TWICE A DAY  Qty: 60 tablet, Refills: 2      desloratadine (CLARINEX) 5 mg tablet Take 5 mg by mouth daily.      exemestane (AROMASIN) 25 mg tablet Take 1 tablet by mouth daily for 90 days.  Qty: 30 tablet, Refills: 2      diphenhydrAMINE (BENADRYL) 50 mg tablet Take 1 Tab by mouth nightly as needed for Itching.  Qty: 30 Tab, Refills: 0    Associated Diagnoses: Itching      OTHER cranial prosthesis  Qty: 1 Each, Refills: 0    Associated Diagnoses: Breast cancer, stage 2; Pain      DOCUSATE SODIUM (COLACE PO) Take 1 Tab by mouth daily.       !! - Potential duplicate medications found. Please discuss with provider.        Activity: No heavy lifting, pushing, pulling with the implant side for 2 months  Diet: Regular Diet  Wound Care: Keep wound clean and dry    Follow-up with Dr. Imogene Burn  in 10 days.  Follow-up tests/labs none    Signed:  Ivar Bury, MD  09/18/2013  8:21 AM

## 2013-09-18 NOTE — Other (Signed)
Interdisciplinary team rounds were held 09/18/2013 with the following team members:  Nursing, Care Management, and CCL.    Plan of care discussed. See clinical pathway and/or care plan for interventions and desired outcomes.  Pt for estimated discharge today.    HKMoneyhan, RN

## 2013-09-23 ENCOUNTER — Encounter

## 2013-09-30 NOTE — Progress Notes (Signed)
Emily [name] is a 62 y.o. female here for follow-up of breast cancer.

## 2013-09-30 NOTE — Progress Notes (Signed)
[facility]  [address], [address]   [place], [place]   [address]  W: [phone]   F: [phone]      f/u HEME/ONC CONSULT      Reason for visit: evaluation for treatment for  Breast Cancer    Consulting physician:  Dr. Dan Humphreys, Dr. Fernanda Drum, Montez Hageman.  Ref physician:  Dr. Maxwell Marion    HPI:   Emily [name] is a 62 y.o.  female who I am seeing for adjuvant therapy for breast cancer.        She had a screening ultrasound on 09/24/12 showing a 2.6 cm left LN.  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  HER 2 negative IHC at 0.    She has a history of a pheochromocytoma surgically removed in 1997.    She then had a MRI breast on 10/05/12 showing the 2.8 x 2 cm LN as well as a left breast LIQ 0.9 cm x 1.1 cm x 0.6 cm mass.  Biopsy of that mass by FNA on 10/16/12 shows a poorly differentiated carcinoma.  These slides were air-dried, HER 2 not able to be performed on them.    Mammaprint of LN shows high risk luminal, ER + at 0.19, PR + at 0.61, HER 2 negative at -0.69.    She underwent L mastectomy on 11/28/12 showing IDC with neuroendocrine features, gr 2, 0.5 cm, DCIS gr 2-3, + LVI, 1/14 LN + with a 1 cm met, no ECE.  HER 2 negative (ratio 1.2, sig/cell 2.8), ER + 95%, PR + 5%; ki67 5%; DCIS ER + 95%, PR + 1%.    S/p TC q 3 weeks x 4 from 12/25/12-02/26/13 (75 mg/m2; 600 mg/m2)    S/p XRT from 04/02/13-05/10/13    Started anastrozole 05/17/13, stop 07/30/13 (joint pains)    exemestane 08/06/13-09/30/13 (joint pains)    Started zometa 07/10/13, q 6 months x 3 years    Interval history: complains of gr 1 constipation, gr 2 fatigue, gr 2 hot flashes, gr 2 insomnia, gr 2 anxiety, gr 1 sob.  Had reconstructive surgery 09/16/13.  Complains of  joint pains 6/10.  States pain on exemestane is mildly improved over anastrozole    LMP 1996.      DX   Encounter Diagnosis   Name Primary?   ??? Breast cancer, stage 2, left Yes      Past Medical History   Diagnosis Date   ??? Cancer      left breast  cancer   ??? Anxiety    ??? Arthritis    ??? Nausea & vomiting    ??? Concussion 2011   ??? Chronic tension headaches    ??? Headache      OCCAS   ??? Other ill-defined conditions      PHEOCHROMOCYTOMA   ??? Other and unspecified symptoms and signs involving general sensations and perceptions      HIVES RECENTLY- WAS IN ER Feb 19, 2013   ??? Lymphedema of arm      left     Past Surgical History   Procedure Laterality Date   ??? Hx orthopaedic       bilateral shoulder arthroscopy   ??? Hx modified radical mastectomy  11/28/2012     LEFT  BREAST MODIFIED RADICAL MASTECTOMY WITH AXILLARY DISSECTION W/ RECONSTRUCTION LEFT BREAST WITH TISSUE EXPANDER AND ALLODERM;PORTACATH INSERTION performed by Binnie Rail, MD at [facility] AMBULATORY OR   ??? Hx gyn       c-section   ??? Hx oophorectomy       right   ??? Hx heent       T&A   ??? Hx heent       right adrenal removed   ??? Pr abdomen surgery proc unlisted       hernia mesh repair   ??? Hx appendectomy     ??? Pr abdomen surgery proc unlisted       right adenal removed   ??? Hx vascular access       PORT, THEN REMOVED   ??? Pr breast surgery procedure unlisted  '91     breast implants; replaced bilat implants 2011   ??? Hx breast reconstruction  11/28/2012     BREAST RECONSTRUCTION /C  INSERTION EXPANDER & ALLODERM performed by Ivar Bury, MD at [facility] AMBULATORY OR   ??? Hx mastectomy Left    ??? Hx breast reconstruction Bilateral 09/16/2013     REMOVAL AND REPLACEMENT LEFT BREAST IMPLANT TISSUE EXPANDERS WITH SUBTOTAL CAPSULECTOMY, RIGHT BREAST IMPLANT REMOVAL AND REPLACEMENT FOR SYMMETRY/FAT GRAFTING TO BILATERAL BREASTS performed by Ivar Bury, MD at [facility] MAIN OR     History     Social History   ??? Marital Status: SINGLE     Spouse Name: N/A     Number of Children:  N/A   ??? Years of Education: N/A     Social History Main Topics   ??? Smoking status: Never Smoker    ??? Smokeless tobacco: Never Used   ??? Alcohol Use: No   ??? Drug Use: No   ??? Sexually Active: Not on file     Other Topics Concern   ??? Not on file     Social History Narrative   ??? No narrative on file     Family History   Problem Relation Age of Onset   ??? Alzheimer Mother    ??? Diabetes Mother    ??? Other Mother      COMBATIVE POST ANESTHESIA   ??? Heart Disease Father    ??? Diabetes Father    ??? Lung Disease Father      COPD   ??? Headache Sister    ??? Migraines Sister    ??? Anesth Problems Neg Hx        Current Outpatient Prescriptions   Medication Sig Dispense Refill   ??? tamoxifen (NOLVADEX) 20 mg tablet Take 1 tablet by mouth daily.  90 tablet  3   ??? oxyCODONE-acetaminophen (PERCOCET) 5-325 mg per tablet Take 2 tablets by mouth every four (4) hours as needed.  80 tablet  0   ??? escitalopram oxalate (LEXAPRO) 20 mg tablet Take 1 tablet by mouth daily.  30 tablet  4   ??? exemestane (AROMASIN) 25 mg tablet Take 1 tablet by mouth daily for 90 days.  30 tablet  2   ??? OTHER cranial prosthesis  1 Each  0   ??? DOCUSATE SODIUM (COLACE PO) Take 1 Tab by mouth daily.       ??? diazepam (VALIUM) 5 mg tablet Take 1 tablet by mouth every six (6) hours as needed (spasm).  40 tablet  0   ??? HYDROmorphone (DILAUDID) 2 mg tablet Take 1 tablet by mouth every four (4) hours  as needed for Pain.  50 tablet  0   ??? clonazepam (KLONOPIN) 0.5 mg tablet TAKE 1 TABLET BY MOUTH TWICE A DAY  60 tablet  2   ??? desloratadine (CLARINEX) 5 mg tablet Take 5 mg by mouth daily.       ??? diphenhydrAMINE (BENADRYL) 50 mg tablet Take 1 Tab by mouth nightly as needed for Itching.  30 Tab  0       Allergies   Allergen Reactions   ??? Aspirin Unknown (comments)     Gi bleeding   ??? Compazine [Prochlorperazine] Rash   ??? Keflex [Cephalexin] Rash   ??? Levaquin [Levofloxacin] Hives     DENIES ALLERGY   ??? Nsaids (Non-Steroidal Anti-Inflammatory Drug) Swelling     Throat swells   ???  Prednisone Rash     DENIES ALLERGY       Review of Systems    A comprehensive review of systems was performed and all systems were negative except for HPI and for the symptom report form, reviewed and scanned in.      Objective:  Physical Exam:  BP 144/77   Pulse 73   Temp(Src) 96.5 ??F (35.8 ??C) (Oral)   Resp 20   Ht 5' 6.5" (1.689 m)   Wt 128 lb 12.8 oz (58.423 kg)   BMI 20.48 kg/m2   SpO2 99%    General:  Alert, cooperative, no distress, appears stated age.   Head:  Normocephalic, without obvious abnormality, atraumatic.   Eyes:  Conjunctivae/corneas clear. PERRL, EOMs intact.   Throat: Lips, mucosa, and tongue normal.    Neck: Supple, symmetrical, trachea midline, no adenopathy, thyroid: no enlargement/tenderness/nodules   Back:   Symmetric, no curvature. ROM normal. No CVA tenderness.   Lungs:   Clear to auscultation bilaterally.   Chest wall:  No tenderness or deformity.   Heart:  Regular rate and rhythm, S1, S2 normal, no murmur, click, rub or gallop.   Abdomen:   Soft, non-tender. Bowel sounds normal. No masses,  No organomegaly.   Extremities: Extremities normal, atraumatic, no cyanosis or edema.   Skin: No rash   Lymph nodes: Cervical, supraclavicular, and axillary nodes normal.   Neurologic: CNII-XII intact.       Diagnostic Imaging   Results for orders placed during the hospital encounter of 06/26/13   XR CHEST PA LAT    Narrative **Final Report**       ICD Codes / Adm.Diagnosis: 786.2   / Cough    Examination:  CR CHEST PA AND LATERAL  - [id number] - Jun 26 2013  2:14PM  Accession No:  [id number]  Reason:  cough      REPORT:  Exam:  2 view chest    Indication: Cough    Comparison: 04/22/13    PA and lateral views demonstrate normal heart size. There is no acute   process in the lung fields. The small dense nodule in left upper lung zone   consistent with a granuloma is unchanged.    Left breast prosthesis is in place. There are calcified granuloma in the   spleen. There are surgical clips in the right upper  quadrant of the abdomen.   The osseous structures are unremarkable.        IMPRESSION:  1. No pneumonia  2. Old granulomatous disease              Signing/Reading Doctor: Syliva Overman [phone])    Approved: Syliva Overman ([id number])  Jun 26 2013  2:20PM                                     Results for orders placed in visit on 10/22/12   PET BREAST CANCER DIAGNOSIS   10/12/12 PET:  Negative for distant mets  10/22/12 brain MRI:  Negative    09/24/12 dexa L hip T -2.5; L spine T -2.3  osteoporosis    Lab Results  Lab Results   Component Value Date/Time    WBC 4.7 09/09/2013 10:19 AM    HGB 12.2 09/09/2013 10:19 AM    HCT 37.0 09/09/2013 10:19 AM    PLATELET 253 09/09/2013 10:19 AM    MCV 88.7 09/09/2013 10:19 AM       Lab Results   Component Value Date/Time    Sodium 144 02/05/2013  9:44 AM    Potassium 4.4 02/05/2013  9:44 AM    Chloride 105 02/05/2013  9:44 AM    CO2 29 02/05/2013  9:44 AM    Anion gap 10 02/05/2013  9:44 AM    Glucose 101 02/05/2013  9:44 AM    BUN 14 02/05/2013  9:44 AM    Creatinine 0.32 02/05/2013  9:44 AM    BUN/Creatinine ratio 44 02/05/2013  9:44 AM    GFR est AA >60 02/05/2013  9:44 AM    GFR est non-AA >60 02/05/2013  9:44 AM    Calcium 8.8 02/05/2013  9:44 AM    AST 22 02/05/2013  9:44 AM    Alk. phosphatase 136 02/05/2013  9:44 AM    Protein, total 6.2 02/05/2013  9:44 AM    Albumin 3.8 02/05/2013  9:44 AM    Globulin 2.4 02/05/2013  9:44 AM    A-G Ratio 1.6 02/05/2013  9:44 AM    ALT 52 02/05/2013  9:44 AM     Assessment/Plan:  62 y.o. female with a 0.5 cm left breast cancer, IDC with endocrine features, 1/14 LN +, gr 2, high risk mammaprint,  ER and PR +, HER 2 negative. PS 0    1. Breast cancer with neuroendocrine features, stage IIA, [id number]    With no further therapy, her RR is about 36%; endocrine therapy will decrease her RR to 19%; and 2nd gen chemo will decrease her RR to 12%; 3rd gen to 10%.  These #s may be higher with her high risk mammaprint.     Based on her pheo and now breast cancer history, I recommended genetic  counseling -- referral to [facility] made at prior visit. Has seen them and they have performed BRCA testing -- negative. She will contact them again to discuss further testing.    No evidence of recurrence.  Will change to tamoxifen 20 mg daily.    Breast MRI on 11/27/12    2. Anxiety/Depression: continue lexapro 20 mg daily.   Valium prn    3. Osteoporosis:  We discussed the data from Rick Duff al, SABCS 2013, for the meta-analysis for adjuvant bisphosphonate use in breast cancer.  We discussed that in postmenopausal women, it appears that the bisphosphate zolendronic acid, given as in ABCSG 12 at 4 mg IV q 6 months x 3 years, is beneficial in improving bone DFS and OS.  We discussed side effects such as ONJ and the need to avoid invasive dental procedures while on these medications, renal damage, and hypocalcemia.  Started on 07/10/13 without problems  4. Joint pains:  Will stop exemestane and switch to tamoxifen 20 mg daily.     The risks and benefits of tamoxifen were discussed in detail and the patient was informed of the following: Risks include a 1% risk of endometrial cancer for postmenopausal women treated for five years but no (or a minimally increased) risk in premenopausal women and that most women who develop tamoxifen-associated endometrial cancer can be cured. Any bleeding in a postmenopausal woman should be reported to a health care professional. There is also a 1% risk of blood clots (thromboembolism) that can be fatal. All patients irrespective of age who take tamoxifen and who have not had a hysterectomy should have a pelvic exam and Pap smear yearly. Tamoxifen increases the risk of cataract formation and on rare occasions has caused retinal damage: an eye exam is recommended yearly. Other risks include vaginal discharge or dryness, the development or worsening of hot flashes or vasomotor symptoms, and bone loss in premenopausal women. There is excellent evidence that tamoxifen does not increase risk of  depression, cause weight gain or have a major effect on sexual function. Available data suggests little or no effect on cognitive function. Benefits include a lowering of cholesterol and a reduction in the rate of bone loss for postmenopausal woman. Any other symptoms should be reported.          Follow-up Disposition:  Return in about 3 months (around 12/29/2013).    Dortha Kern, MD

## 2013-09-30 NOTE — Patient Instructions (Signed)
Stop exemestane and change to tamoxifen    Herbert Seta is [phone]    Return in 3 months

## 2013-10-16 ENCOUNTER — Encounter

## 2013-10-16 NOTE — Progress Notes (Signed)
[address]  [place]  [address]      INITIAL EVALUATION    NAME: Emily [name] AGE: 63 y.o.  GENDER: female  DATE: 10/16/2013  REFERRING PHYSICIAN: Michaele Offer, MD   HISTORY AND BACKGROUND:  Pt is a 63 y/o female found to have 2.6 cm LN L axilla 09/24/2012.  Underwent biopsy on 10/02/2012 with findings of metastatic neoplasm.  MRI on 10/05/2012 revealed L breast mass in addition to lymph node.  Biopsy of breast mass on 10/16/2012 showed poorly differentiated carcinoma.  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  Pt received care to address L UE lymphedema at [facility], and comes to this clinic for a second opinion per order from [name].  Pt presented at initial eval with pain L axilla, reporting "fluid pocket" in region, severe pain with shoulder movement, and feeling of "fullness" L upper arm. Pt states symptoms presented post surgery.  Pt also reports bilat shoulder dysfunction prior to cancer diagnosis, having undergone R rotator cuff repair 7 years ago, L rotator cuff repair 3 years ago.     Pt returns to treatment today s/p bilateral breast reconstruction 09/16/2013.  Pt reports L shoulder function and pain improved after expander removed and implant placed L.  Pt reports she is restricted from lifting, pushing, pulling at this time, however, states [name] did instruct to begin "massaging" implants.  Per operative report, L tissue expander noted with capsular contracture. Pt states incisions healing well.     Primary Diagnosis:  ?? 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 Malignant neoplasm of breast (174.0)  ?? Pain in joint (719.41)   Date of Onset: 11/29/2011  Present Symptoms and Functional Limitations: Pain L axilla/upper arm/upper quadrant anterior/lateral aspect.    Shoulder Functional Scale: 42/80 indicating 40% impairment; LLIS outcomes measure regarding impairment caused by lymphedema 52/90 with 47% impairment.  Past Medical History:   Past Medical History   Diagnosis Date   ??? Cancer      left breast  cancer   ??? Anxiety    ??? Arthritis    ??? Nausea & vomiting    ??? Concussion 2011   ??? Chronic tension headaches    ??? Headache      OCCAS   ??? Other ill-defined conditions      PHEOCHROMOCYTOMA   ??? Other and unspecified symptoms and signs involving general sensations and perceptions      HIVES RECENTLY- WAS IN ER Feb 19, 2013   ??? Lymphedema of arm      left     Past Surgical History   Procedure Laterality Date   ??? Hx orthopaedic       bilateral shoulder arthroscopy   ??? Hx modified radical mastectomy  11/28/2012     LEFT BREAST MODIFIED RADICAL MASTECTOMY WITH AXILLARY DISSECTION W/ RECONSTRUCTION LEFT BREAST WITH TISSUE EXPANDER AND ALLODERM;PORTACATH INSERTION performed by Ty Hilts, MD at [place]   ??? Hx gyn       c-section   ??? Hx oophorectomy       right   ??? Hx heent       T&A   ??? Hx heent  right adrenal removed   ??? Pr abdomen surgery proc unlisted       hernia mesh repair   ??? Hx appendectomy     ??? Pr abdomen surgery proc unlisted       right adenal removed   ??? Hx vascular access       PORT, THEN REMOVED   ??? Pr breast surgery procedure unlisted  '91     breast implants; replaced bilat implants 2011   ??? Hx breast reconstruction  11/28/2012     BREAST RECONSTRUCTION /C  INSERTION EXPANDER & ALLODERM performed by Sharlotte Alamo, MD at [place]   ??? Hx mastectomy Left    ??? Hx breast reconstruction Bilateral 09/16/2013     REMOVAL AND REPLACEMENT LEFT BREAST IMPLANT TISSUE EXPANDERS WITH SUBTOTAL CAPSULECTOMY, RIGHT BREAST IMPLANT REMOVAL AND REPLACEMENT FOR SYMMETRY/FAT GRAFTING TO BILATERAL BREASTS performed by  Sharlotte Alamo, MD at [facility] MAIN OR     Current Medications:    Current Outpatient Prescriptions   Medication Sig   ??? tamoxifen (NOLVADEX) 20 mg tablet Take 1 tablet by mouth daily.   ??? diazepam (VALIUM) 5 mg tablet Take 1 tablet by mouth every six (6) hours as needed (spasm).   ??? oxyCODONE-acetaminophen (PERCOCET) 5-325 mg per tablet Take 2 tablets by mouth every four (4) hours as needed.   ??? HYDROmorphone (DILAUDID) 2 mg tablet Take 1 tablet by mouth every four (4) hours as needed for Pain.   ??? escitalopram oxalate (LEXAPRO) 20 mg tablet Take 1 tablet by mouth daily.   ??? exemestane (AROMASIN) 25 mg tablet Take 1 tablet by mouth daily for 90 days.   ??? clonazepam (KLONOPIN) 0.5 mg tablet TAKE 1 TABLET BY MOUTH TWICE A DAY   ??? desloratadine (CLARINEX) 5 mg tablet Take 5 mg by mouth daily.   ??? diphenhydrAMINE (BENADRYL) 50 mg tablet Take 1 Tab by mouth nightly as needed for Itching.   ??? OTHER cranial prosthesis   ??? DOCUSATE SODIUM (COLACE PO) Take 1 Tab by mouth daily.     No current facility-administered medications for this encounter.     Allergies:   Allergies   Allergen Reactions   ??? Aspirin Unknown (comments)     Gi bleeding   ??? Compazine [Prochlorperazine] Rash   ??? Keflex [Cephalexin] Rash   ??? Levaquin [Levofloxacin] Hives     DENIES ALLERGY   ??? Nsaids (Non-Steroidal Anti-Inflammatory Drug) Swelling     Throat swells   ??? Prednisone Rash     DENIES ALLERGY      Social/Work History and Prior Level of Function:  Pt reports continuing intermittent work schedule.  Pt lives in [place].  Pt has a 93 year old son which she and her partner care for.   Living Situation: Pt lives near her work place, which is a good distance from this clinic, probably an hour one way.  Lives with boyfriend and young son.     Trainable Caregiver?: Pt's significant other is an ob-gyn, interested in patient's condition.     Self-care/ADLs: performing mod I, additional time, UE dressing deliberately to avoid pain L UE.     Mobility: indep  transfers, bed mobility, gait in clinic.   Sleeping Arrangement:  Bed.   Adaptive Equipment Owned: na  Previous Therapy:  Pt received therapy for lymphedema management at [facility]. 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  Pt has been inconsistent  with attending appts throughout treatment secondary to distance pt lives from clinic, with pt also stating she "needed a break" from treatment for a couple of weeks after XRT to allow her skin to heal.  Dr. Mervyn Gay concurred and notified clinic that pt would return in treatment in 2 weeks post conclusion of XRT, which is occurring today.  Pt returns to treatment today s/p bilat breast reconstruction with silicone implants placed and fat grafting performed 09/16/13.    Compression/Lymphedema Equipment:  Juzo 2001 compression sleeve/gauntlet, two sets. Pt requests information to purchase additional compression garments out of pocket.    SUBJECTIVE:   Pt reports pain L UE/axilla more severe in nature since reconstructive surgery 09/16/2013. Pt verbalizes activity restrictions, as noted in discharge summary s/p surgery, with pt stating she is not lifting or rocking her son, avoiding pushing and pulling.  Pt reports increased pain since surgery when compared with improvement attained as previously reported prior to surgery.  Pt states she takes Perocet at night to manage pain, 2/5 mg valium during the day, and then again at night. Pt reports waking frequently in the night, up to 5-7 times, getting up briefly, then returning back to bed. Pt states she experiences "pulling" into axilla with shoulder movement L.  Reports history of bilat frozen shoulders prior to breast cancer diagnosis and treatment.  Pt reports noting more swelling L UE since surgery, stating she is wearing compression sleeve/gauntlet only every other day as she does not want to "wear compression garments out" and having to  obtain more.  Pt reports air travel and cruise scheduled for February.  Patient???s goals for therapy: Relieve pain with L shoulder function; Decrease L UE swelling; "get back to normal and be able to take care of my son."    EVALUATION AND OBJECTIVE DATA SUMMARY:   Pain: L lateral upper quadrant/lat region/axilla/pect 5-7/10 with shoulder ROM; 3/10 at rest with shoulder in neutral position.           Skin and Tissue Assessment:  Dermal Status:  ( x)  Intact ( )  Dry   ( )  Tenuous:  ( )  Flaky   ( )  Wound/lesion present ( x)  Scars:  Incision L breast anterior aspect s/p most recent surgery healing well.   ( )  Dermatitis    Texture/Consistency:  (x )  Boggy:  Forearm volar aspect, upper arm visible fullness. ( )  Pitting Edema   ( )  Brawny (x )  Implants: bilateral mobile medial/superior gentle pressure, with pt without c/o.   ( )  Fibrotic/Woody    Pigmentation/Color Change:  (x )  Normal:  L UE ( )  Hemosiderin   ( )  Red:   ( )  Erythematous   ( )  Hyperpigmented ( )  Hyperlipodermatosclerosis   Anomalies:    (x )  Axillary web syndrome:  Note palpable cord noted L axilla, with radiating symptoms into upper arm with shoulder abduction.  Heavy cording noted compared with most recent treatment prior to surgery 09/16/2013.   ( )  Vesicles   ( )  Petechiae ( )  Warty Vercusis   ( )  Bullae ( )  Papilloma   Circulatory:  ( )  ABI ( )  Varicosities:   ( x)  Pulses: Palpable ( )  Vascular studies ruled out DVT in past   ( )  DVT History    Nails:  (x )  Normal  ( )  Fungus  Stemmers Sign: negative  (36 or greater: adversely affecting lymphedema)  Volumetric Measurements:   Right: 1570.56 mL Left:  1682.89 mL   % Difference: 7.15% Dominance: Right   (See scanned graph)  Range of Motion: R shoulder flex 124, abd 116, ER 65, IR 45, with elbow, wrist, hand WFL.  L shoulder Flex 114, Abd 88, ER 45, IR 60.  Pt unable to place hand behind head, behind back. All shoulder movement guarded bilaterally, L>R.  Strength: Shoulder  strength bilat grossly 4-/5 within available ROM, limited by pain/weakness.  Elbow, wrist, hand R 4/5, L 4/5.  Sensation:  Pt c/o numbness upper arm, lateral upper quadrant.  Palpation:  Note axillary web syndrome, heavy cording noted compared with prior visit 07/2013 prior to surgery 09/16/2013.  Note trigger points and muscular tension increased pect major/minor, lat, subscapularis, upper trap, rhomboid.  Posture: Note forward head/shoulder posture noted.  Mobility:  Bed/Chair Mobility:  indep Transfers:  indep   Sitting Balance:  good Standing Balance:  good   Gait:  indep Wheelchair Mobility:  na   Endurance:  Appears intact for current level of activity, including gait community distances, working abbreviated schedule.  Pt currently on lifting restrictions s/p surgery.  Stairs:  Not assessed.       Safety:  Patient is alert and oriented:  x4   Safety awareness:  intact   Fall Risk?:  Low, use of pain meds evening hours.      Precautions:  Standard lymphedema precautions to include avoiding blood pressure readings, injections and IVs or other procedures/acts that could lead to broken skin on affected area, and avoiding excessive heat, resistive activity or altitude without compression garment    Evaluation Time: 11:28 am-12:00 pm    TREATMENT PROVIDED:   1.  Treatment description:  The patient was instructed again in the lymphedema management protocol of complete decongestive therapy (CDT) secondary to noted elevated L UE limb volume with repeat assessment today.  Elevated limb volume may be post surgical edema mixed lymphedema, regardless, management remains the same, and pt would benefit from reduction of limb volume to reduce risk of infection and relieve additional fluid load on an impaired lymphatic system.  Pt was instructed to continue skin care to L UE and upper quadrant to prevent breakdown or infection, lymphedema exercises within limits of pain, and daily wear of CCL1 compression sleeve and gauntlet  daily at this time.  Pt instructed to don compression garments first thing in the morning, and wear until bedtime as tolerated.  Pt in agreement with this plan.  Gentle STM performed sub scapularis, pect major/minor, with multiple trigger points noted in musculature. Gentle mobilization of axillary cording, applying stretch/counter stretch, again, gentle skin stretch technique, to address cording within limits of pain.    Pt previously was only able to tolerated this activity in supine with UE supported, and is now able to perform in standing position with good results. Pt demonstrates good understanding of this exercise, to continue 3-4 times/day as tolerated.  MLD performed including stim supraclavicular lnn, inguinal lnn L.  Full UE sequence L. Pt advised to gently mobilize bilat breast implants as instructed by surgeon, moving medially/superiorly as tolerated, with pt tolerating well in clinic today.  Pt to follow MD instructions regarding this technique.  Kinesiotape applied upper arm, anchor at posterior shoulder, fan toward elbow and spiral technique medial aspect upper arm.  Pt instructed to remove tape cautiously within 3-5 days, or prior to that time  with redness, irritation noted in region of tape.  Pt has tolerated KT well with previous applications.  Treatment time:  12:01-12:49 pm  Manual therapy 3   Treatment description:  Gentle shoulder ROM flex/abd x 5 reps, pairing with elbow flex/ext within limits of pain.  Pt instructed to continue self mobilization of cording in supine/standing position moving shoulder into abd and externally rotated to range of comfort, adding elbow flex/ext and wrist flex/ext as tolerated 5 reps, 2x/day, deliberate movement, within limits of pain.  Pt performs scapular protraction/retraction, elevation/depression, cervical rotation/lateral flex to continue 5 reps, 2x/day within limits of pain.  Written program provided.  Therapeutic Exercise:  12:50-12:59 pm  Therapeutic  exercise 1      ASSESSMENT:   Emily [name] is a 63 y.o. 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  Pt tolerated manual therapy with light pressure approach, arriving with significant pain in region, remaining hypersensitive to touch. Trigger points noted upper quadrant musculature as noted above.  Progress with therapy prior to recent surgery was been limited due to appts being intermittent, with pt recently returning to treatment now, which will be attempted weekly. Pt does report overall improvement in discomfort L upper quadrant/UE since expander removed and implant placed. Shoulder dysfunction noted bilaterally, which pt reports was present prior to mastectomy, which has further limited shoulder function with increase in pain. Will continue to assess as treatment progresses. Patient will benefit from complete decongestive therapy (CDT) including manual lymphatic drainage (MLD) technique, compression garment to be worn daily as instructed, and kinesiotaping as appropriate.  Patient will receive instruction in proper skin care to recognize signs/symptoms of and prevent infection, therapeutic exercise, and self-MLD for independent home program and restorative lymphatic performance.  Pt may also benefit from limited modality use for pain management.  If pain and L shoulder function is not significantly improved within 1-2 months, plan to refer back to MD for further assessment.    This care is medically necessary due to the infection risk with lymphedema, and to improve functional activities.  CDT is necessary to resolve swelling to allow patient to return to wearing normal clothes/footwear, and prevent worsening of symptoms, such as venous stasis ulcerations, infections, or hospitalizations.   Patient will be independent with home program strategies to allow improved ADL ability and mobility and to allow patient to return to greatest functional independence.    Rehabilitation potential is considered to be Good for goals.  Factors which may influence rehabilitation potential include significant pain per pt report continues as noted previous visits at this time possibly limiting treatment, as well as prior shoulder dysfunction still present, however, pt motivated to improve.  Patient will benefit from 1-2x/week physical therapy visits over 4-8 weeks to optimize improvement in these areas.    PLAN OF CARE:   Recommendations and Planned Interventions:  Manual lymph drainage/complete decongestive therapy  Joint mobilization/manual therapy  Compression garment fitting/provision  Lymphedema therapeutic exercise  AROM/PROM/Strength/Coordination  Self-care training  Education in skin care and lymphedema precautions  Self-MLD education per home program  Self-bandaging education per home program  Caregiver education as needed  Vasopneumatic pump for home use as indicated and tolerated     GOALS  Short term goals  Time frame: 2-4 weeks  1. Instruct the patient to be independent with  proper skin care to prevent future skin breakdown and decrease the potential risk for infections that are associated with Lymphedema.  2. Patient will be independent with a personal lymphedema exercise program to assist with the lymphatic flow and reduce L UE limb volume by 50-75%, under 3-5% limb volume discrepancy noted.  3. Patient will understand the signs and symptoms of acute infection.  Long term goals  Time frame: 4-8 weeks  1.   Patient will have knowledge of the compression options and acquire a safe and   appropriate daytime and night time compression system to prevent    re-accumulation of fluid.  2.  Patient or family will be able to don/doff garments independently.  Garment   system effectiveness will be evaluated prior to  discharge for better long-term   management and outcomes.  3.  Improvement in UEFS by 4-8 points allowing pt to perform at shoulder level activities with 75% less pain.    4.  L shoulder ROM flex/abd improved by 10-20 degrees with 75% less pain at end range.   5.  Pt independent in self MLD, implant mobilization.  6.   To transition patient to independent restorative phase of CDT.           Patient has participated in goal setting and agrees to work toward plan of care.  Patient was instructed to call if questions or concerns arise.    Thank you for this referral.  Kalman Shan, PT, CLT    Time Calculation: 91 mins    TREATMENT PLAN EFFECTIVE DATES:   10/16/2013 to 01/10/2014  I have read the above plan of care for [facility].  I certify the above prescribed services are required by this patient and are medically necessary.  The above plan of care has been developed in conjunction with the lymphedema/physical therapist.       Physician Signature: ____________________________________Date:______________

## 2013-10-25 NOTE — Telephone Encounter (Signed)
Requested Prescriptions     Signed Prescriptions Disp Refills   ??? clonazePAM (KLONOPIN) 0.5 mg tablet 60 tablet 2     Sig: TAKE 1 TABLET BY MOUTH TWICE A DAY     Authorizing Provider: Joyce Gross     Ordering User: Vida Rigger per [name].

## 2013-10-30 ENCOUNTER — Encounter

## 2013-10-30 NOTE — Telephone Encounter (Signed)
Clonazepam refilled per pharmacies request.

## 2013-10-30 NOTE — Progress Notes (Signed)
[name]  Lymphedema Clinic and [facility] Rehabilitation  [address]  [address]  [place]  [address]      LYmphedema Therapy  Visit: 2    [x]         Daily note               []        30 day/10th visit progress note    NAME: Emily [name]  DATE: 10/30/2013    GOALS   Short term goals   Time frame: 2-4 weeks   1. Instruct the patient to be independent with proper skin care to prevent future skin breakdown and decrease the potential risk for infections that are associated with Lymphedema.  2. Patient will be independent with a personal lymphedema exercise program to assist with the lymphatic flow and reduce L UE limb volume by 50-75%, under 3-5% limb volume discrepancy noted.  3. Patient will understand the signs and symptoms of acute infection.  Long term goals   Time frame: 4-8 weeks   1. Patient will have knowledge of the compression options and acquire a safe and appropriate daytime and night time compression system to prevent re-accumulation of fluid.   2. Patient or family will be able to don/doff garments independently. Garment system effectiveness will be evaluated prior to discharge for better long-term management and outcomes.   3. Improvement in UEFS by 4-8 points allowing pt to perform at shoulder level activities with 75% less pain.   4. L shoulder ROM flex/abd improved by 10-20 degrees with 75% less pain at end range.   5. Pt independent in self MLD, implant mobilization.   6. To transition patient to independent restorative phase of CDT.         SUBJECTIVE REPORT:  Pt reports pain L axilla/upper arm 5/10, taking pain meds at 4pm daily and again at bedtime.  Pt states she is continuing ROM L shoulder within limits of pain.  Expresses concern than L UE is swelling, and compression sleeve may be "too tight".   Pain:  5/10, improves minimally after taking Percocet per pt report.           Gait:  indep  ADLs:  indep  Treatment Response:   Pt reports response to treatment at time of eval resulted in soreness for a few days, however, improvement noted regarding pain/shoulder function after treatment.  Pt reports good response to kinesiotape prior treatment, with reapplication performed today.  Reviewed packet received at evaluation.  Function: See eval.  Began MLD (manual lymphatic drainage) techniques with lotioning and skin care and range of motion exercise routine without props.    TREATMENT AND OBJECTIVE DATA SUMMARY:   Patient/Family Education:      Educated in skin care: Reviewed skin care principles to continue daily cleansing L UE/upper quadrant daily, applying low pH lotion L UE using upward strokes..                   Therapeutic Exercise/Procedure 5 minutes   Treatment time: 1:21-1:26 pm               Home program: Patient to perform daily to BID skin care, deep abdominal breathing, exercise routine including L UE supported on bed with shoulder abd  paired with elbow extension, with wrist flex/ext to glide axillary cording.  To continue 2-3 times/day 5-10 reps within limits of pain..   Rationale: Exercise will increase the lymph angiomotoricity and tissue pressure of the skin and thus decrease swelling.       Manual Therapy 59 minutes   Treatment time: 12:21-1:20 pm   Area to decongest: L upper quadrant/UE   Sequence used and effectiveness: Stim R axillary/L inguinal/bilat supraclavicular lnn.  Stim shoulder collectors bilat.  Activated L AI/AAA/PAA anastomoses.  Upper quadrant sequence including lateral to midline techniques stim perforating precollectors parasternal/paraspinal.  Upper arm sequence to elbow including medial to lateral techniques.    STM pect major/minor/upper trap/rhomboid L.  Gentle pect stretch L in sidelying.    Cording techniques palpable axillary region, including proximal/distal glides, stretch/counter stretch, and perpendicular gliding of cording, with gentle technique used to perform within limits of pain for pt.   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  L shoulder abd 90 degrees/flex 110.                  Skin/wound care/debridement: Intact.  L breast incision intact/healing well.             Upper/Lower extremity compression: Therapist assessed fit of L UE compression garments with good fit noted.  Pt reports wearing garment from 8am to 5pm daily as tolerated.  Pt advised to Remove compression if the following occur:  1.  Numbness/tingling increased from what you experience without compression.  2.  Compromise in blood flow, with poor capillary refill, or color changes to toes/fingers.  3.  Onset of pain different from what you may already experience.  4.  Unexplained shortness of breath.  5.  Signs/symptoms of infection including flu-like symptoms, fever, redness, warmth, pain.                         Kinesiotaping: Applied anchor anterior lower quadrant, fan into axilla L to promote drainage of involved region.   Girth/Volume measurement: Repeat limb volume with 3.91% limb volume discrepancy noted.  See scanned graph.     TOTAL TREATMENT 65 mins       ASSESSMENT:   Treatment effectiveness and tolerance: Note continued heavy cording initiating axillary incision, with additional cord noted along pect minor muscle, painful with gentle manual techniques.  Lateral upper quad L mildly boggy with MLD to address region.  Shoulder ROM limited bilaterally.  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  Pt may benefit from low pressure vasopneumatic pump sequence as tolerated to promote lymphatic drainage of L upper quadrant/UE.     Progress toward goals: Continue with manual therapy.  Progress HEP as tolerated.       PLAN OF CARE:   Changes to the plan of care: Continue MLD/manual therapy.  Pt to bring vasopneumatic pump to next scheduled appt with therapist to assess tolerance and response to treatment.  Will clear with Dr. Geryl Councilman prior to recommending  treatment with pump at home.  Continue HEP progressing as tolerated.  Monitor limb volumes as indicated.   Frequency: Weekly/prn.   Other: na       Kalman Shan, PT, CLT

## 2013-10-30 NOTE — Telephone Encounter (Signed)
Rx for Clonzepam 0.5 mg tab 1 tab BID #60, 5 refills called to [facility], [address] [place], [place] per Sarajane Jews, NP.

## 2013-11-06 NOTE — Progress Notes (Signed)
[name]  L[facility]hedema Clinic and [facility]  [address]  [address]  [place]  [address]      LYmphedema Therapy  Visit: 3    [x]         Daily note               []        30 day/10th visit progress note    NAME: Emily [name]  DATE: 11/06/2013    GOALS   Short term goals   Time frame: 2-4 weeks   1. Instruct the patient to be independent with proper skin care to prevent future skin breakdown and decrease the potential risk for infections that are associated with Lymphedema.  2. Patient will be independent with a personal lymphedema exercise program to assist with the lymphatic flow and reduce L UE limb volume by 50-75%, under 3-5% limb volume discrepancy noted.  3. Patient will understand the signs and symptoms of acute infection.  Long term goals   Time frame: 4-8 weeks   1. Patient will have knowledge of the compression options and acquire a safe and appropriate daytime and night time compression system to prevent re-accumulation of fluid.   2. Patient or family will be able to don/doff garments independently. Garment system effectiveness will be evaluated prior to discharge for better long-term management and outcomes.   3. Improvement in UEFS by 4-8 points allowing pt to perform at shoulder level activities with 75% less pain.   4. L shoulder ROM flex/abd improved by 10-20 degrees with 75% less pain at end range.   5. Pt independent in self MLD, implant mobilization.   6. To transition patient to independent restorative phase of CDT.         SUBJECTIVE REPORT:  Pt reports pain L axilla/upper arm 5/10, continuing to require pain meds at 4pm daily and again at bedtime.  Pt states she is continuing ROM L shoulder within limits of pain.  Brings pump to clinic today to trial in supervised environment. Pt states she has continued neural tension technique with good tolerance.   Pain:  5/10, improves minimally after taking Percocet per pt  report.           Gait:  indep  ADLs:  indep  Treatment Response:  Pt reports good tolerance of Comfy sleeve pump with lowest pressure setting used at 31 mm/Hg.    Reviewed packet received at evaluation.  Function: See eval.  Began MLD (manual lymphatic drainage) techniques with lotioning and skin care and range of motion exercise routine without props.    TREATMENT AND OBJECTIVE DATA SUMMARY:   Patient/Family Education:      Educated in skin care: Reviewed skin care principles to continue daily cleansing L UE/upper quadrant daily, applying low pH lotion L UE using upward strokes..                   Therapeutic Exercise/Procedure    Treatment time:               Home program: Patient to perform daily to BID skin care, deep abdominal breathing, exercise routine including L UE supported on bed with shoulder abd paired with elbow extension, with wrist flex/ext to glide axillary cording.  To  continue 2-3 times/day 5-10 reps within limits of pain..   Rationale: Exercise will increase the lymph angiomotoricity and tissue pressure of the skin and thus decrease swelling.       Vasopneumatic pump 12:42-1:16 pm  Manual Therapy:  1:18-1:58 pm 34 minutes  40 minutes   Treatment time: 12:21-1:20 pm   Area to decongest: L upper quadrant/UE   Sequence used and effectiveness: Vasopneumatic pump sequence performed in clinic, 30 minutes, lowest pressure setting, with pt tolerating well excluding minor elbow discomfort after sleeve removed secondary to pt stating she positioned arm uncomfortably vs pump tension.  Pt advised to begin daily use of device within tolerance, 30 minutes, lowest pressure setting, with therapist to evaluate response next scheduled appt 11/11/2013.  Pt to discontinue use with any pain, discomfort, noted swelling, and notify clinic of concerns.     STM pect major/minor/upper trap.    Cording techniques palpable axillary region, including proximal/distal glides, stretch/counter stretch, and perpendicular gliding  of cording, with gentle technique used to perform within limits of pain for pt.  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  L shoulder abd 100 degrees/flex 116. Improvement since most recent treatment noted.                  Skin/wound care/debridement: Intact.  L breast incision intact/healing well.             Upper/Lower extremity compression:  Pt to continue wearing L UE compression garments from 8am to 5pm daily as tolerated.  Pt advised to Remove compression if the following occur:  1.  Numbness/tingling increased from what you experience without compression.  2.  Compromise in blood flow, with poor capillary refill, or color changes to toes/fingers.  3.  Onset of pain different from what you may already experience.  4.  Unexplained shortness of breath.  5.  Signs/symptoms of infection including flu-like symptoms, fever, redness, warmth, pain.                         Kinesiotaping: Applied anchor posterior lower quadrant, fan into axilla L to promote drainage of involved region.   Girth/Volume measurement: Deferred     TOTAL TREATMENT 77 mins       ASSESSMENT:   Treatment effectiveness and tolerance: Note continued heavy cording initiating axillary incision, with additional cord noted along pect minor muscle, painful with gentle manual techniques, however, improvement in L shoulder flex/abd noted today as above.  Shoulder ROM limited bilaterally.  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  Pt may benefit from low pressure vasopneumatic pump sequence as tolerated to promote lymphatic drainage of L upper quadrant/UE, with 30 minute sequence tolerated in clinic, pt to continue this daily.   Progress toward goals: Continue with manual therapy.  Progress HEP as tolerated.       PLAN OF CARE:   Changes to the plan of care: Continue MLD/manual therapy/vasopneumatic pump trial at home as instructed, continuing as tolerated until next  scheduled appt 11/11/2013, with pt to discontinue use as documented above.  Continue HEP progressing as tolerated.  Monitor limb volumes as indicated.    Frequency: Weekly/prn.   Other: na       Kalman Shan, PT, CLT

## 2013-11-12 MED ORDER — BUTALBITAL-ACETAMINOPHEN-CAFFEINE 50 MG-325 MG-40 MG TAB
50-325-40 mg | ORAL_TABLET | ORAL | Status: DC | PRN
Start: 2013-11-12 — End: 2013-12-05

## 2013-11-12 NOTE — Telephone Encounter (Signed)
Returned patient's call, who states she has been having "alot" of headaches lately.  Patient states she had a bad sinus infection and she's not sure if her headaches could be coming from that or not.  Patient states she leaves tomorrow for a cruise for two weeks and was inquiring if [name] would call her in a prescription for Fioricet.  Patient states she has tried that in the past and it has helped.  Informed patient that per [name], prescription would be sent to pharmacy for [facility].  Instructed patient to let office know if headaches persist or worsen.  Patient verbalizes understanding and has no further questions at this time.  Per verbal order from [name], [phone] for:  Requested Prescriptions     Signed Prescriptions Disp Refills   ??? butalbital-acetaminophen-caffeine (FIORICET, ESGIC) 50-325-40 mg per tablet 30 Tab 0     Sig: Take 1 Tab by mouth every four (4) hours as needed for Headache. Max Daily Amount: 6 Tabs.     Authorizing Provider: Joyce Gross     Ordering User: Reynaldo Minium, Kenyotta Dorfman J

## 2013-11-12 NOTE — Telephone Encounter (Signed)
Pt called. She needs a prescription for headaches today-she will be going out of the country tomorrow. Please call [phone]

## 2013-11-26 NOTE — Telephone Encounter (Signed)
Pt called.  She said that she received a call from [employer] regarding the MRI scheduled for Thursday. They need to speak with Dr Laurena Bering to authorize. She can be reached at [phone]

## 2013-11-26 NOTE — Telephone Encounter (Signed)
Called and informed patient that we had been notified that [employer] was requiring a peer to peer review for MRI and that Denny Peon, NP would be completing that later this afternoon.  Patient verbalizes understanding and has no further questions at this time.

## 2013-11-27 NOTE — Progress Notes (Signed)
[name]  Lymphedema Clinic and Cancer Rehabilitation  [address]  [address]  [place]  [address]      LYmphedema Therapy  Visit: 3    [x]         Daily note               []        30 day/10th visit progress note    NAME: Emily [name]  DATE: 11/27/2013    GOALS   Short term goals   Time frame: 2-4 weeks   1. Instruct the patient to be independent with proper skin care to prevent future skin breakdown and decrease the potential risk for infections that are associated with Lymphedema.  2. Patient will be independent with a personal lymphedema exercise program to assist with the lymphatic flow and reduce L UE limb volume by 50-75%, under 3-5% limb volume discrepancy noted.  3. Patient will understand the signs and symptoms of acute infection.  Long term goals   Time frame: 4-8 weeks   1. Patient will have knowledge of the compression options and acquire a safe and appropriate daytime and night time compression system to prevent re-accumulation of fluid.   2. Patient or family will be able to don/doff garments independently. Garment system effectiveness will be evaluated prior to discharge for better long-term management and outcomes.   3. Improvement in UEFS by 4-8 points allowing pt to perform at shoulder level activities with 75% less pain.   4. L shoulder ROM flex/abd improved by 10-20 degrees with 75% less pain at end range.   5. Pt independent in self MLD, implant mobilization.   6. To transition patient to independent restorative phase of CDT.         SUBJECTIVE REPORT:  Pt reports pain L axilla/upper arm 5/10, continuing to require pain meds at 4pm daily and again at bedtime.  Pt states she is continuing ROM L shoulder within limits of pain. Pt reports travel went well.  Does report L UE feeling more swollen since air travel. Pt reports she has not initiated use of vasopneumatic pump at home.   Pain:  5/10, improves minimally after taking  Percocet per pt report.           Gait:  indep  ADLs:  indep  Treatment Response:  Pt presents with elevated limb volumes.  Good fit of current compression garments noted despite elevated limb volume.  Reviewed packet received at evaluation.  Function: See eval.  Began MLD (manual lymphatic drainage) techniques with lotioning and skin care and range of motion exercise routine without props.    TREATMENT AND OBJECTIVE DATA SUMMARY:   Patient/Family Education:      Educated in skin care: Reviewed skin care principles to continue daily cleansing L UE/upper quadrant daily, applying low pH lotion L UE using upward strokes..                   Therapeutic Exercise/Procedure : 1:30-1:42 pm 12 minutes   Treatment time:               Home program: Patient to perform daily to BID skin care, deep abdominal breathing, exercise routine performed with assist of therapist including L UE supported on bed with  shoulder abd paired with elbow extension, with wrist flex/ext to glide axillary cording; resisted scapular add/depression 2 sets of 10, pect stretch over towel roll with gentle manual release pect minor.  To continue active scapular ex and remaining ex 2-3 times/day 5-10 reps within limits of pain..   Rationale: Exercise will increase the lymph angiomotoricity and tissue pressure of the skin and thus decrease swelling.       Manual therapy: 12:30-1:28 pm 58 minutes       Area to decongest: L upper quadrant/UE   Sequence used and effectiveness: Note increased severity in axillary web syndrome today, with shoulder flex 80, abd 75 degrees, with pt reporting tension initiating at axilla and radiating into L UE, distal to elbow.    MLD sequence upper quad, L UE to elbow.  Stim R axillary/L inguinal lnn.  Activated AAA/PAA/L AI.  Medial to lateral techniques upper arm.      STM pect major/minor/upper trap/rhomboid.    Cording techniques palpable axillary region to antecubital fossa, including proximal/distal glides, stretch/counter  stretch, and perpendicular gliding of cording, with gentle technique used to perform within limits of pain for pt.  At conclusion of treatment:  L shoulder abd 105 degrees/flex 110. Reassessed L UE strength, with shoulder flexors 4/5, abd 4-/5, rotator cuff 4-5.  Difficult differentiating weakness vs limitations due to pain with activity.  Continue to assess.                   Skin/wound care/debridement: Intact.  L breast incision intact/healing well.             Upper/Lower extremity compression:  Pt to continue wearing L UE compression garments from 8am to 5pm daily as tolerated.  Pt advised to Remove compression if the following occur:  1.  Numbness/tingling increased from what you experience without compression.  2.  Compromise in blood flow, with poor capillary refill, or color changes to toes/fingers.  3.  Onset of pain different from what you may already experience.  4.  Unexplained shortness of breath.  5.  Signs/symptoms of infection including flu-like symptoms, fever, redness, warmth, pain.                         Kinesiotaping: Applied anchor posterior lower quadrant, fan into axilla L to promote drainage of involved region.   Girth/Volume measurement: Repeat limb volume today with limb volume discrepancy noted at 6.01%, elevated since prior measurements.  See scanned graph.     TOTAL TREATMENT 72 mins       ASSESSMENT:   Treatment effectiveness and tolerance: Note increased heavy cording initiating axillary incision, with additional cord noted along pect minor muscle, painful with gentle manual techniques, with good response noted to treatment today.  Shoulder ROM limited bilaterally.  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  Pt may benefit from low pressure vasopneumatic pump sequence as tolerated to promote lymphatic drainage of L upper quadrant/UE, with 30 minute sequence tolerated in clinic, pt to implement this daily.   Progress  toward goals: Continue with manual therapy.  Progress HEP as tolerated.       PLAN OF CARE:   Changes to the plan of care: Continue MLD/manual therapy/vasopneumatic pump trial at home as instructed, continuing as tolerated until next scheduled appt in two weeks, with pt to discontinue use as documented above.  Continue HEP progressing as tolerated.  Monitor limb volumes as  indicated.    Frequency: Weekly/prn.   Other: na       Kalman Shan, PT, CLT

## 2013-12-04 MED ORDER — DIAZEPAM 5 MG TAB
5 mg | ORAL_TABLET | Freq: Four times a day (QID) | ORAL | Status: DC | PRN
Start: 2013-12-04 — End: 2014-03-17

## 2013-12-04 NOTE — Progress Notes (Signed)
HISTORY OF PRESENT ILLNESS  Emily [name] is a 62 y.o. female.  HPI Established patient here today for a check on severe Left axilla pain . She states she has had the pain since the surgery.Axillary web syndrome. Patient saw Dr Chin today and he wants her to gain 10lb for a nipple reconstruction.  She is requesting a refill of valium. She is not sleeping well at all. Pain is 7/10. She is also being treated at the lymphedema clinic weekly.  ROS    Physical Exam   Cardiovascular: Normal rate and normal heart sounds.    Pulmonary/Chest: Breath sounds normal. Right breast exhibits no inverted nipple, no mass, no nipple discharge, no skin change and no tenderness. Left breast exhibits no inverted nipple, no mass, no nipple discharge, no skin change and no tenderness. Breasts are symmetrical.       Lymphadenopathy:        Right cervical: No superficial cervical, no deep cervical and no posterior cervical adenopathy present.       Left cervical: No superficial cervical, no deep cervical and no posterior cervical adenopathy present.        Right axillary: No pectoral and no lateral adenopathy present.        Left axillary: No pectoral and no lateral adenopathy present.      ASSESSMENT and PLAN  Encounter Diagnoses   Name Primary?   ??? Breast cancer, stage 2, left Yes     Doing well except for left axillary pain.  Will continue PT and I will see back in  6 months.

## 2013-12-06 NOTE — Addendum Note (Signed)
Addended by: Daiva Huge on: 12/06/2013 11:54 AM     Modules accepted: Level of Service

## 2013-12-06 NOTE — Communication Body (Signed)
HISTORY OF PRESENT ILLNESS  Emily [name] is a 63 y.o. female.  HPI Established patient here today for a check on severe Left axilla pain . She states she has had the pain since the surgery.Axillary web syndrome. Patient saw Dr Geryl Councilman today and he wants her to gain 10lb for a nipple reconstruction.  She is requesting a refill of valium. She is not sleeping well at all. Pain is 7/10. She is also being treated at the lymphedema clinic weekly.  ROS    Physical Exam   Cardiovascular: Normal rate and normal heart sounds.    Pulmonary/Chest: Breath sounds normal. Right breast exhibits no inverted nipple, no mass, no nipple discharge, no skin change and no tenderness. Left breast exhibits no inverted nipple, no mass, no nipple discharge, no skin change and no tenderness. Breasts are symmetrical.       Lymphadenopathy:        Right cervical: No superficial cervical, no deep cervical and no posterior cervical adenopathy present.       Left cervical: No superficial cervical, no deep cervical and no posterior cervical adenopathy present.        Right axillary: No pectoral and no lateral adenopathy present.        Left axillary: No pectoral and no lateral adenopathy present.      ASSESSMENT and PLAN  Encounter Diagnoses   Name Primary?   ??? Breast cancer, stage 2, left Yes     Doing well except for left axillary pain.  Will continue PT and I will see back in  6 months.

## 2013-12-09 NOTE — Progress Notes (Signed)
[name]  [facility]  [address]  [address]  [place]  [address]      LYmphedema Therapy  Visit: 4    [x]         Daily note               []        30 day/10th visit progress note    NAME: Emily [name]  DATE: 12/09/2013    GOALS   Short term goals   Time frame: 2-4 weeks   1. Instruct the patient to be independent with proper skin care to prevent future skin breakdown and decrease the potential risk for infections that are associated with Lymphedema.  2. Patient will be independent with a personal lymphedema exercise program to assist with the lymphatic flow and reduce L UE limb volume by 50-75%, under 3-5% limb volume discrepancy noted.  3. Patient will understand the signs and symptoms of acute infection.  Long term goals   Time frame: 4-8 weeks   1. Patient will have knowledge of the compression options and acquire a safe and appropriate daytime and night time compression system to prevent re-accumulation of fluid.   2. Patient or family will be able to don/doff garments independently. Garment system effectiveness will be evaluated prior to discharge for better long-term management and outcomes.   3. Improvement in UEFS by 4-8 points allowing pt to perform at shoulder level activities with 75% less pain.   4. L shoulder ROM flex/abd improved by 10-20 degrees with 75% less pain at end range.   5. Pt independent in self MLD, implant mobilization.   6. To transition patient to independent restorative phase of CDT.         SUBJECTIVE REPORT:  Pt reports pain L axilla/upper arm 3/10, continuing to require pain meds at 4pm daily and again at bedtime, however, reports improvement.  Pt states she is continuing ROM L shoulder within limits of pain. Pt reports she has been using Comfy sleeve pump at lowest compression setting daily with reportedly good results.   Pain:  3/10 today.            Gait:  indep  ADLs:   indep  Treatment Response:  Note decreased palpable axillary cording noted today.  L shoulder ROM continues to be limited.  Reviewed packet received at evaluation.  Function: See eval.    TREATMENT AND OBJECTIVE DATA SUMMARY:   Patient/Family Education:      Educated in skin care: Reviewed skin care principles to continue daily cleansing L UE/upper quadrant daily, applying low pH lotion L UE using upward strokes..                   Therapeutic Exercise/Procedure : 10:45-11:00 15 minutes   Treatment time:               Home program: Patient to perform daily to BID skin care, deep abdominal breathing, exercise routine performed with assist of therapist including L UE supported on bed with shoulder abd paired with elbow extension, with wrist flex/ext to glide axillary cording; pect stretch over towel roll with gentle manual release pect minor. Added scap depression 2 sets of 10 with shoulders in neutral, weight bearing  through fists with elbows in flexion holding 10 seconds, repeating x 5, and shoulder extensors sets 2 sets of 10.  To continue active scapular ex and remaining ex 2-3 times/day 5-10 reps within limits of pain..   Rationale: Exercise will increase the lymph angiomotoricity and tissue pressure of the skin and thus decrease swelling.       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        STM pect major/minor/upper trap/rhomboid.    Cording techniques palpable axillary region to antecubital fossa, including proximal/distal glides, skin traction length of cord, and perpendicular gliding of cording, with gentle technique used to perform within limits of pain for pt.  At conclusion of treatment:  L shoulder abd 106 degrees/flex 100.      Postural changes/pect shortening contributing to shoulder dysfunction.                    Skin/wound care/debridement: Intact.  L breast incision intact/healing well.             Upper/Lower extremity compression:  Pt to continue wearing L UE compression garments from 8am to 5pm daily as tolerated.  Pt advised to Remove compression if the following occur:  1.  Numbness/tingling increased from what you experience without compression.  2.  Compromise in blood flow, with poor capillary refill, or color changes to toes/fingers.  3.  Onset of pain different from what you may already experience.  4.  Unexplained shortness of breath.  5.  Signs/symptoms of infection including flu-like symptoms, fever, redness, warmth, pain.                         Kinesiotaping:    Girth/Volume measurement: deferred     TOTAL TREATMENT 80 mins       ASSESSMENT:   Treatment effectiveness and tolerance: Note improvement in palpable cording initiating axillary incision, with additional cord noted along pect minor muscle, painful with gentle manual techniques, with good response noted to treatment today.  Shoulder ROM limited bilaterally.  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  Pt experiencing decreased pain with daily use of vasopneumatic pump.   Progress toward goals: Continue with manual therapy.  Progress HEP as tolerated.       PLAN OF CARE:   Changes to the plan of care: Continue MLD/manual therapy/vasopneumatic pump trial at home as instructed, continuing as tolerated until next scheduled appt in two weeks, with pt to discontinue use as documented above.  Continue HEP progressing as tolerated, assessing response to additional exercises performed today, to continue daily.  Monitor limb volumes as indicated.    Frequency: Weekly/prn.   Other: na       Kalman Shan, PT, CLT

## 2013-12-11 ENCOUNTER — Encounter

## 2013-12-11 MED ORDER — GADOPENTETATE DIMEGLUMINE 7.5 MMOL/15 ML (469.01 MG/ML) IV
7.5 mmol/15 mL (469.01 mg/mL) | Freq: Once | INTRAVENOUS | Status: AC
Start: 2013-12-11 — End: 2013-12-11
  Administered 2013-12-11: 19:00:00 via INTRAVENOUS

## 2013-12-11 MED FILL — MAGNEVIST 7.5 MMOL/15 ML (469.01 MG/ML) INTRAVENOUS SOLUTION: 7.5 mmol/15 mL (469.01 mg/mL) | INTRAVENOUS | Qty: 15

## 2013-12-13 NOTE — Telephone Encounter (Signed)
Patient would like her MRI results when they become available.

## 2013-12-13 NOTE — Telephone Encounter (Signed)
Called and left message on patient's voicemail informing her that MRI results were not back yet, but once they were available office would call her.

## 2013-12-16 NOTE — Telephone Encounter (Signed)
Called and informed patient that we had spoke to the MRI department and that they had been waiting for her outside images to come in so they could compare the studies and that they were hoping to have the results by today or tomorrow.  Informed patient that once results were available, we would call her.  Patient verbalizes understanding.

## 2013-12-16 NOTE — Telephone Encounter (Signed)
Patient Called to check on MRI from 3/11. Informed that they are still coming back as "In Process". She would like to know if we can check with radiology on status and give her a call back with update.

## 2013-12-17 NOTE — Telephone Encounter (Signed)
Spoke with patient and advised of results of MRI below, patient expressed understanding.

## 2013-12-17 NOTE — Telephone Encounter (Signed)
-----   Message from Joyce Gross, MD sent at 12/17/2013  2:17 PM EDT -----  Let her know this is negative, thanks

## 2013-12-17 NOTE — Progress Notes (Signed)
Quick Note:    Let her know this is negative, thanks  ______

## 2013-12-17 NOTE — Telephone Encounter (Signed)
Called patient to advise per Dr Laurena Bering, MRI results below, no answer, left message to call back.

## 2013-12-30 NOTE — Telephone Encounter (Signed)
Patient called. She has had several episodes of pain up her arms and spine. It takes her breath away. Please call [phone]

## 2013-12-30 NOTE — Telephone Encounter (Signed)
Called and spoke to patient who states she has had three episodes in the last two weeks that lasted about ten minutes each time where patient describes pain that feels like "someone is taking a scalpel up my spine and up into both arms."  Patient states when it occurs she just has to sit and wait it out because it takes her breath away.  Patient didn't realize she had an appointment to see [name] on 01/01/14 at 11:00 a.m. And states she will just discuss it with him at upcoming appointment.

## 2014-01-01 NOTE — Telephone Encounter (Signed)
L/M earlier asking that I return her call as she is interested in having her port put back in.  She has poor venous access in the RIGHT arm and does not want to use her LEFT arm at all since she has had a mastectomy with LN dissection.      Saw [name] earlier today who did not agree with the patient having a port placed for long term use.  Will be having twice yearly Zometa injections and is concerned about her poor IV access.  Also has some more reconstructive surgery that needs to be performed.    Advised patient that I am pretty sure that Dr. Drucilla Schmidt will not want to put a port in for long term use.  Did reassure her that it is okay to use her LEFT arm for sterile needle sticks if that is her only option.    She was very appreciative of the call and felt better after our conversation.  She will not ask Dr. Drucilla Schmidt to place another port.

## 2014-01-01 NOTE — Progress Notes (Signed)
[facility]  [address], [place]   [address]  W: [phone]   F: [phone]      f/u HEME/ONC CONSULT      Reason for visit: evaluation for treatment for  Breast Cancer    Consulting physician:  Dr. Gilford Rile, Dr. Raynald Blend, Brooke Bonito.  Ref physician:  Dr. Drucilla Schmidt    HPI:   Emily [name] is a 63 y.o.  female who I am seeing for adjuvant therapy for breast cancer.        She had a screening ultrasound on 09/24/12 showing a 2.6 cm left LN.  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  HER 2 negative IHC at 0.    She has a history of a pheochromocytoma surgically removed in 1997.    She then had a MRI breast on 10/05/12 showing the 2.8 x 2 cm LN as well as a left breast LIQ 0.9 cm x 1.1 cm x 0.6 cm mass.  Biopsy of that mass by FNA on 10/16/12 shows a poorly differentiated carcinoma.  These slides were air-dried, HER 2 not able to be performed on them.    Mammaprint of LN shows high risk luminal, ER + at 0.19, PR + at 0.61, HER 2 negative at -0.69.    She underwent L mastectomy on 11/28/12 showing IDC with neuroendocrine features, gr 2, 0.5 cm, DCIS gr 2-3, + LVI, 1/14 LN + with a 1 cm met, no ECE.  HER 2 negative (ratio 1.2, sig/cell 2.8), ER + 95%, PR + 5%; ki67 5%; DCIS ER + 95%, PR + 1%.    S/p TC q 3 weeks x 4 from 12/25/12-02/26/13 (75 mg/m2; 600 mg/m2)    S/p XRT from 04/02/13-05/10/13    Started anastrozole 05/17/13, stop 07/30/13 (joint pains)    exemestane 08/06/13-09/30/13 (joint pains)    Started zometa 07/10/13, q 6 months x 3 years    Started tamoxifen 09/30/13    Interval history: complains of gr 2 loss of appetite, gr 2 fatigue, gr 1 constipation, gr 2 nausea, gr 1 hot flashes, gr 1 insomnia, gr 2  neuropathy.    Complains of a "GI bug" for last than a week.  Nausea since last week.    Still complains of left arm lymphedema and pain.    Complains of a fibromyalgia pain.    Highly anxious about pressure to color her hair.  Also about needle sticks, is a "hard blood draw."    LMP 1996.      DX   Encounter Diagnosis   Name Primary?   ??? Breast cancer, stage 2, left Yes      Past Medical History   Diagnosis Date   ??? Cancer      left breast  cancer   ??? Anxiety    ??? Arthritis    ??? Nausea & vomiting    ??? Concussion 2011   ??? Chronic tension headaches    ??? Headache      OCCAS   ??? Other ill-defined conditions      PHEOCHROMOCYTOMA   ??? Other and unspecified symptoms and signs involving general sensations and perceptions      HIVES RECENTLY- WAS IN ER Feb 19, 2013   ???  Lymphedema of arm      left     Past Surgical History   Procedure Laterality Date   ??? Hx orthopaedic       bilateral shoulder arthroscopy   ??? Hx modified radical mastectomy  11/28/2012     LEFT BREAST MODIFIED RADICAL MASTECTOMY WITH AXILLARY DISSECTION W/ RECONSTRUCTION LEFT BREAST WITH TISSUE EXPANDER AND ALLODERM;PORTACATH INSERTION performed by Ty Hilts, MD at [place]   ??? Hx gyn       c-section   ??? Hx oophorectomy       right   ??? Hx heent       T&A   ??? Hx heent       right adrenal removed   ??? Pr abdomen surgery proc unlisted       hernia mesh repair   ??? Hx appendectomy     ??? Pr abdomen surgery proc unlisted       right adenal removed   ??? Hx vascular access       PORT, THEN REMOVED   ??? Pr breast surgery procedure unlisted  '91     breast implants; replaced bilat implants 2011   ??? Hx breast reconstruction  11/28/2012     BREAST RECONSTRUCTION /C  INSERTION EXPANDER & ALLODERM performed by Sharlotte Alamo, [place] at [place]   ??? Hx mastectomy Left    ??? Hx breast reconstruction Bilateral 09/16/2013     REMOVAL AND REPLACEMENT LEFT BREAST IMPLANT TISSUE EXPANDERS WITH SUBTOTAL CAPSULECTOMY, RIGHT BREAST IMPLANT REMOVAL AND REPLACEMENT  FOR SYMMETRY/FAT GRAFTING TO BILATERAL BREASTS performed by Sharlotte Alamo, MD at [place]     History     Social History   ??? Marital Status: SINGLE     Spouse Name: N/A     Number of Children: N/A   ??? Years of Education: N/A     Social History Main Topics   ??? Smoking status: Never Smoker    ??? Smokeless tobacco: Never Used   ??? Alcohol Use: No   ??? Drug Use: No   ??? Sexual Activity: Not on file     Other Topics Concern   ??? Not on file     Social History Narrative     Family History   Problem Relation Age of Onset   ??? Alzheimer Mother    ??? Diabetes Mother    ??? Other Mother      COMBATIVE POST ANESTHESIA   ??? Heart Disease Father    ??? Diabetes Father    ??? Lung Disease Father      COPD   ??? Headache Sister    ??? Migraines Sister    ??? Anesth Problems Neg Hx        Current Outpatient Prescriptions   Medication Sig Dispense Refill   ??? tamoxifen (NOLVADEX) 20 mg tablet Take 1 tablet by mouth daily.  90 tablet  3   ??? escitalopram oxalate (LEXAPRO) 20 mg tablet Take 1 tablet by mouth daily.  30 tablet  4   ??? desloratadine (CLARINEX) 5 mg tablet Take 5 mg by mouth daily.       ??? DOCUSATE SODIUM (COLACE PO) Take 1 Tab by mouth daily.       ??? diazepam (VALIUM) 5 mg tablet Take 1 Tab by mouth every six (6) hours as needed for Anxiety (muscle spasm). Max Daily Amount: 20 mg.  50 Tab  2   ??? oxyCODONE-acetaminophen (PERCOCET) 5-325 mg  per tablet Take 2 tablets by mouth every four (4) hours as needed.  80 tablet  0       Allergies   Allergen Reactions   ??? Aspirin Unknown (comments)     Gi bleeding   ??? Compazine [Prochlorperazine] Rash   ??? Keflex [Cephalexin] Rash   ??? Levaquin [Levofloxacin] Hives     DENIES ALLERGY   ??? Nsaids (Non-Steroidal Anti-Inflammatory Drug) Swelling     Throat swells   ??? Prednisone Rash     DENIES ALLERGY       Review of Systems    A comprehensive review of systems was performed and all systems were negative except for HPI and for the symptom report form, reviewed and scanned in.      Objective:  Physical Exam:   BP 125/67    Pulse 65    Temp(Src) 99 ??F (37.2 ??C)    Resp 20    Ht $R'5\' 6"'YK$  (1.676 m)    Wt 127 lb (57.607 kg)    BMI 20.51 kg/m2      SpO2 100%     General:  Alert, cooperative, no distress, appears stated age.   Head:  Normocephalic, without obvious abnormality, atraumatic.   Eyes:  Conjunctivae/corneas clear. PERRL, EOMs intact.   Throat: Lips, mucosa, and tongue normal.    Neck: Supple, symmetrical, trachea midline, no adenopathy, thyroid: no enlargement/tenderness/nodules   Back:   Symmetric, no curvature. ROM normal. No CVA tenderness.   Lungs:   Clear to auscultation bilaterally.   Chest wall:  No tenderness or deformity.   Heart:  Regular rate and rhythm, S1, S2 normal, no murmur, click, rub or gallop.   Abdomen:   Soft, non-tender. Bowel sounds normal. No masses,  No organomegaly.   Extremities: Extremities normal, atraumatic, no cyanosis or edema.   Skin: No rash   Lymph nodes: Cervical, supraclavicular, and axillary nodes normal.   Neurologic: CNII-XII intact.       Diagnostic Imaging   Results for orders placed during the hospital encounter of 10/16/13   XR KNEE RT 3 V    Narrative **Final Report**       ICD Codes / Adm.Diagnosis: 719.46   / Pain in joint, lower leg    Examination:  CR KNEE 3 VWS RT  - [id number] - Oct 16 2013  1:52PM  Accession No:  [id number]  Reason:  719.46      REPORT:  EXAM:  CR KNEE 3 VWS RT    INDICATION:   719.46    COMPARISON:     .    FINDINGS: Three views of the right knee demonstrate no fracture or other   acute osseous or articular abnormality.  There is a small joint effusion.    R. Spurring of the patella mild medial joint space narrowing, the bone is   normal in mineral content.        IMPRESSION:  Mild DJD.              Signing/Reading Doctor: Lannette Donath [phone])    Approved: Lannette Donath [phone])  Oct 16 2013  2:11PM                                     Results for orders placed in visit on 10/22/12   PET BREAST CANCER DIAGNOSIS   10/12/12 PET:  Negative for distant mets   10/22/12 brain  MRI:  Negative    09/24/12 dexa L hip T -2.5; L spine T -2.3  osteoporosis    12/11/13 bilateral MRI  Negative    Lab Results  Lab Results   Component Value Date/Time    WBC 4.7 09/09/2013 10:19 AM    HGB 12.2 09/09/2013 10:19 AM    HCT 37.0 09/09/2013 10:19 AM    PLATELET 253 09/09/2013 10:19 AM    MCV 88.7 09/09/2013 10:19 AM       Lab Results   Component Value Date/Time    Sodium 144 02/05/2013  9:44 AM    Potassium 4.4 02/05/2013  9:44 AM    Chloride 105 02/05/2013  9:44 AM    CO2 29 02/05/2013  9:44 AM    Anion gap 10 02/05/2013  9:44 AM    Glucose 101 02/05/2013  9:44 AM    BUN 14 02/05/2013  9:44 AM    Creatinine 0.32 02/05/2013  9:44 AM    BUN/Creatinine ratio 44 02/05/2013  9:44 AM    GFR est AA >60 02/05/2013  9:44 AM    GFR est non-AA >60 02/05/2013  9:44 AM    Calcium 8.8 02/05/2013  9:44 AM    AST 22 02/05/2013  9:44 AM    Alk. phosphatase 136 02/05/2013  9:44 AM    Protein, total 6.2 02/05/2013  9:44 AM    Albumin 3.8 02/05/2013  9:44 AM    Globulin 2.4 02/05/2013  9:44 AM    A-G Ratio 1.6 02/05/2013  9:44 AM    ALT 52 02/05/2013  9:44 AM     Assessment/Plan:  63 y.o. female with a 0.5 cm left breast cancer, IDC with endocrine features, 1/14 LN +, gr 2, high risk mammaprint,  ER and PR +, HER 2 negative. PS 0    1. Breast cancer with neuroendocrine features, stage IIA, [id number]    With no further therapy, her RR is about 36%; endocrine therapy will decrease her RR to 19%; and 2nd gen chemo will decrease her RR to 12%; 3rd gen to 10%.  These #s may be higher with her high risk mammaprint.     Based on her pheo and now breast cancer history, I recommended genetic counseling -- referral to [facility] made at prior visit. Has seen them and they have performed BRCA testing -- negative. She will contact them again to discuss further testing and they are planning further blood work.    No evidence of recurrence.  Continue tamoxifen 20 mg daily.    2. Anxiety/Depression: worse today; continue lexapro 20 mg daily.   Valium prn; recommend  counseling.  Will have [facility], reach out to her.    3. Osteoporosis:  We discussed the data from [place], [place] 2013, for the meta-analysis for adjuvant bisphosphonate use in breast cancer.  We discussed that in postmenopausal women, it appears that the bisphosphate zolendronic acid, given as in ABCSG 12 at 4 mg IV q 6 months x 3 years, is beneficial in improving bone DFS and OS.  We discussed side effects such as ONJ and the need to avoid invasive dental procedures while on these medications, renal damage, and hypocalcemia.  Started on 07/10/13 without problems, but she is highly anxious today about getting stuck and so we will delay her next dose for now.    Will hold the zometa this month and will consider wither RECLAST or prolia in 6 months    4. Joint pains: improved off of  the AIs    5. Fibromyalgia:  Recommend lyrica that has been prescribed previously    > 25 minutes were spent with this patient with > 50% of that time spent in face to face counseling.          Follow-up Disposition:  Return in about 3 months (around 04/02/2014).    Sonda Rumble, MD

## 2014-01-01 NOTE — Patient Instructions (Signed)
Reclast or prolia    Continue tamoxifen

## 2014-01-01 NOTE — Progress Notes (Signed)
[facility]  [address]  [address]  [place]  [address]      Lymphedema Therapy      01/01/2014:  Emily [name] was not seen on this date for physical therapy for the following reasons:    [x]          Patient called to cancel the visit for the following reasons: GI virus, not feeling well  []          Patient missed the visit and did not call to cancel.    Doy Hutching

## 2014-01-01 NOTE — Progress Notes (Signed)
Emily [name] is a 62 y.o. female here for follow up of breast cancer.

## 2014-01-02 NOTE — Telephone Encounter (Signed)
Patient Navigator Encounter   Patient Name:  Emily [name]    Medical History: breast cancer    Narrative: Called pt to follow up from her visit with [name] to discuss pt's emotional well-being. Pt shared that she is feeling well, continuing to move forward on her journey through diagnosis and treatment, and she feels she is "coasting now." Pt shared she feels she is in "good hands" when she speaks of her medical team and shared that she trusts her team with her care.     She shared that she and [name] discussed her anxiety. Pt shared that she feels she is managing her anxiety well and has the support of her partner Nathaneil Canary) to provide her the emotional support she needs. Talked with pt about counseling-suggesting that pt could explore a deeper understanding of how to balance emotionally and physically one's self. Talked with pt about the benefit of psychotherapy after one has completed treatment and begin this new chapter of their life (finding her new normal). Pt disagrees with this and is not interested in counseling. Encouraged pt to keep it in mind and to let me know if/when she would like to explore the counseling resources in her area. I even suggest pt consider attending a support group for peer support. Pt not interested in this. Respected pt's wishes and encouraged her to call me should she reconsider. Pt voiced understanding and thanked me for the call.     Thank you.   Renee Ramus

## 2014-01-07 MED ORDER — ESCITALOPRAM 20 MG TAB
20 mg | ORAL_TABLET | ORAL | Status: DC
Start: 2014-01-07 — End: 2014-04-17

## 2014-01-15 ENCOUNTER — Telehealth

## 2014-01-15 ENCOUNTER — Encounter

## 2014-01-15 NOTE — Progress Notes (Signed)
[address]  [place]  [address]      Re-EVALUATION    NAME: Emily [name] AGE: 63 y.o.  GENDER: female  DATE: 01/15/2014  REFERRING PHYSICIAN: Michaele Offer, MD   HISTORY AND BACKGROUND:  Pt is a 63 y/o female found to have 2.6 cm LN L axilla 09/24/2012.  Underwent biopsy on 10/02/2012 with findings of metastatic neoplasm.  MRI on 10/05/2012 revealed L breast mass in addition to lymph node.  Biopsy of breast mass on 10/16/2012 showed poorly differentiated carcinoma.  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  Pt completed chemotherapy and XRT.  Pt received care to address L UE lymphedema at [facility], prior to coming to this clinic. Pt presented at initial eval with pain L axilla, reporting "fluid pocket" in region, severe pain with shoulder movement, and feeling of "fullness" L upper arm. Pt stated symptoms presented post surgery.  Pt reports bilat shoulder dysfunction prior to cancer diagnosis, having undergone R rotator cuff repair 7 years ago, L rotator cuff repair 3 years ago. Pt is s/p bilateral breast reconstruction 09/16/2013.  States additional reconstructive surgery scheduled for 01/30/2014.      Pt returns today stating she continues with significant pain L UE, new swelling at elbow with pain in region, and limited shoulder ROM L.  See further medical history as noted below.     Primary Diagnosis:  ?? 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 Malignant neoplasm of breast (174.0)  ?? Pain in joint (719.41)  Date of Onset: 11/29/2011  Present Symptoms and Functional Limitations: Pain L axilla/upper arm/upper quadrant anterior/lateral aspect.     Shoulder Functional Scale: 35/80 indicating 40% impairment.  Past Medical History:   Past Medical History   Diagnosis Date   ??? Cancer ([place])      left breast  cancer   ??? Anxiety    ??? Arthritis    ??? Nausea & vomiting    ??? Concussion 2011   ??? Chronic tension headaches    ??? Headache(784.0)      OCCAS   ??? Other ill-defined conditions(799.89)      PHEOCHROMOCYTOMA   ??? Other and unspecified symptoms and signs involving general sensations and perceptions      HIVES RECENTLY- WAS IN ER Feb 19, 2013   ??? Lymphedema of arm      left     Past Surgical History   Procedure Laterality Date   ??? Hx orthopaedic       bilateral shoulder arthroscopy   ??? Hx modified radical mastectomy  11/28/2012     LEFT BREAST MODIFIED RADICAL MASTECTOMY WITH AXILLARY DISSECTION W/ RECONSTRUCTION LEFT BREAST WITH TISSUE EXPANDER AND ALLODERM;PORTACATH INSERTION performed by Ty Hilts, MD at [facility]   ??? Hx gyn       c-section   ??? Hx oophorectomy       right   ??? Hx heent       T&A   ??? Hx heent       right adrenal removed   ??? Pr abdomen surgery proc unlisted       hernia mesh repair   ??? Hx appendectomy     ???  Pr abdomen surgery proc unlisted       right adenal removed   ??? Hx vascular access       PORT, THEN REMOVED   ??? Pr breast surgery procedure unlisted  '91     breast implants; replaced bilat implants 2011   ??? Hx breast reconstruction  11/28/2012     BREAST RECONSTRUCTION /C  INSERTION EXPANDER & ALLODERM performed by Sharlotte Alamo, MD at [facility]   ??? Hx mastectomy Left    ??? Hx breast reconstruction Bilateral 09/16/2013     REMOVAL AND REPLACEMENT LEFT BREAST IMPLANT TISSUE EXPANDERS WITH SUBTOTAL CAPSULECTOMY, RIGHT BREAST IMPLANT REMOVAL AND REPLACEMENT FOR SYMMETRY/FAT GRAFTING TO BILATERAL BREASTS performed by Sharlotte Alamo, MD at [facility] MAIN OR     Current Medications:    Current Outpatient Prescriptions   Medication Sig   ??? escitalopram oxalate (LEXAPRO) 20 mg tablet TAKE 1 TABLET BY MOUTH DAILY.   ??? diazepam (VALIUM)  5 mg tablet Take 1 Tab by mouth every six (6) hours as needed for Anxiety (muscle spasm). Max Daily Amount: 20 mg.   ??? tamoxifen (NOLVADEX) 20 mg tablet Take 1 tablet by mouth daily.   ??? oxyCODONE-acetaminophen (PERCOCET) 5-325 mg per tablet Take 2 tablets by mouth every four (4) hours as needed.   ??? desloratadine (CLARINEX) 5 mg tablet Take 5 mg by mouth daily.   ??? DOCUSATE SODIUM (COLACE PO) Take 1 Tab by mouth daily.     No current facility-administered medications for this encounter.     Allergies:   Allergies   Allergen Reactions   ??? Aspirin Unknown (comments)     Gi bleeding   ??? Compazine [Prochlorperazine] Rash   ??? Keflex [Cephalexin] Rash   ??? Levaquin [Levofloxacin] Hives     DENIES ALLERGY   ??? Nsaids (Non-Steroidal Anti-Inflammatory Drug) Swelling     Throat swells   ??? Prednisone Rash     DENIES ALLERGY      Social/Work History and Prior Level of Function:  Pt reports continuing intermittent work schedule.  Pt lives in [place].  Pt has a 58 year old son which she and her partner care for.   Living Situation: Pt lives near her work place, which is a good distance from this clinic, probably an hour one way.  Lives with boyfriend and young son.     Trainable Caregiver?: Pt's significant other is an ob-gyn, interested in patient's condition.     Self-care/ADLs: performing mod I, additional time, UE dressing deliberately to avoid pain L UE.     Mobility: indep transfers, bed mobility, gait in clinic.   Sleeping Arrangement:  Bed.   Adaptive Equipment Owned: na  Previous Therapy:  Pt received therapy for lymphedema management at [facility]. 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  Pt has been inconsistent with attending appts throughout treatment secondary to distance pt lives from clinic.  Pt returns to treatment today with continued c/o L shoulder, cervical, UE pain.  Pt also c/o increase in L UE swelling.   Compression/Lymphedema Equipment:  Juzo 2001 size I compression sleeve/gauntlet, two sets. Pt requests information to purchase additional compression garments out of pocket.    SUBJECTIVE:   Pt reports pain L UE/axilla more severe in nature over past couple of week.  Pt states she takes 5 mg valium at bedtime to manage pain and allow her to sleep. Pt reports waking frequently  in the night, up to 5-7 times, getting up briefly, then returning back to bed. Pt states she experiences "pulling" into axilla with shoulder movement L.  Reports history of bilat frozen shoulders prior to breast cancer diagnosis and treatment.  Pt reports noting more swelling L UE over past couple of week, stating she is wearing compression sleeve/gauntlet every other day.  Pt reports use of vasopneumatic pump every other day.  Patient???s goals for therapy: Relieve pain with L shoulder function; Decrease L UE swelling.    EVALUATION AND OBJECTIVE DATA SUMMARY:   Pain: L lateral upper quadrant/lat region/axilla/pect 7-9/10 with shoulder ROM; 5/10 at rest with shoulder in neutral position. Pain with palpable medial elbow region with visible swelling noted.           Skin and Tissue Assessment:  Dermal Status:  ( x)  Intact ( )  Dry   ( )  Tenuous:  ( )  Flaky   ( )  Wound/lesion present ( x)  Scars:  Incision L breast anterior aspect s/p most recent surgery healing well.   ( )  Dermatitis    Texture/Consistency:  (x )  Boggy:  Forearm volar aspect, medial elbow, venous structures visible with skin appearing taut and translucent in region when compared with R UE.  Lateral upper quadrant fullness noted. ( )  Pitting Edema   ( )  Brawny (x )  Implants: bilateral mobile medial/superior gentle pressure, with pt without c/o.   ( )  Fibrotic/Woody    Pigmentation/Color Change:  ( )  Normal:   ( )  Hemosiderin   ( x)  Red:  Mild, pink in color, medial elbow. ( )  Erythematous   ( )  Hyperpigmented ( )  Hyperlipodermatosclerosis   Anomalies:    (x )   Axillary web syndrome:  Note palpable cord noted L axilla, with radiating symptoms into upper arm with shoulder abduction.  Moderate  cording noted compared with most recent visit. ( )  Vesicles   ( )  Petechiae ( )  Warty Vercusis   ( )  Bullae ( )  Papilloma   Circulatory:  ( )  ABI ( )  Varicosities:   ( x)  Pulses: Palpable radial. ( )  Vascular studies ruled out DVT in past   ( )  DVT History    Nails:  (x )  Normal  ( )  Fungus  Stemmers Sign: negative  (36 or greater: adversely affecting lymphedema)  Volumetric Measurements:   Right: 1612.74 mL Left:  1785.68 mL   % Difference: 10.72% Dominance: Right   (See scanned graph)  Range of Motion: R shoulder flex 124, abd 116, ER 65, IR 45, with elbow, wrist, hand WFL.  L shoulder Flex 80, Abd 78, ER 35, IR 45.  Pt unable to place hand behind head, behind back. All shoulder movement guarded bilaterally, L>R.  Strength: Shoulder strength bilat grossly 4-/5 within available ROM, limited by pain/weakness.  Elbow, wrist, hand R 4/5, L 4/5.  Sensation:  Pt c/o numbness upper arm, lateral upper quadrant.  Palpation:  Note axillary web syndrome, ,oderate cording noted compared with most recent visit. Note trigger points and muscular tension increased pect major/minor, lat, subscapularis, upper trap, rhomboid.  Posture: Note forward head/shoulder posture noted.  Mobility:  Bed/Chair Mobility:  indep Transfers:  indep   Sitting Balance:  good Standing Balance:  good   Gait:  indep Wheelchair Mobility:  na   Endurance:  Appears intact  for current level of activity, including gait community distances, working abbreviated schedule.  Pt currently on lifting restrictions s/p surgery.  Stairs:  Not assessed.       Safety:  Patient is alert and oriented:  x4   Safety awareness:  intact   Fall Risk?:  Low, use of pain meds evening hours.      Precautions:  Standard lymphedema precautions to include avoiding blood pressure readings, injections and IVs or other procedures/acts that  could lead to broken skin on affected area, and avoiding excessive heat, resistive activity or altitude without compression garment    Evaluation Time: 11:00-11:22 am   22 minutes    TREATMENT PROVIDED:   1.  Treatment description:  Gentle STM performed sub scapularis, pect major/minor, upper trap with gentle first rib mob, with multiple trigger points noted in musculature. Therapist assessed fit of compression sleeve, with girth measurements within parameters of currently worn size.  Deferred MLD today secondary  to sudden elevated limb volume associated with pain. Therapist telephoned Dr. Valinda Hoar office to inquire about doppler study to r/o DVT.  Spoke with Janett Billow, LPN, who returned phone call with doppler study scheduled for this afternoon. Pt advised to hold compression garment wear, returning to wearing L UE compression garments if doppler study clear regarding DVT.  Pt was advised that, due to sporadic attendance regarding schedule PT appts, her progress has been slow, and any progress we have achieved has been compromised by pt initiated cancelled appts.  Pt advised in the importance of consistency with treatment to address lymphedema, axillary web syndrome, and shoulder dysfunction.  We discussed that if improvement is not seen in the next 4-5 visits, a referral back to her orthopedist may be indicated.  Pt verbalizes understanding and agreement with therapist's instruction at this time.  Treatment time:  11:23-11:52 am  Manual therapy 2         ASSESSMENT:   RAELYNNE LUDWICK is a 63 y.o. female who presents with stage 1 lymphedema L UE with increased fullness forearm/elbow to axilla, axillary web syndrome persistent with moderate cording noted and elevated limb volume today over 10%.  Pt reports increase in swelling L UE fairly sudden in nature, with pain with palpation medial elbow/upper arm reported.  Therapist contacted Dr. Valinda Hoar office regarding new development with doppler study ordered for today.   Limited manual therapy with light pressure approach performed today, addressing trigger points upper quadrant musculature as noted above.  Progress with therapy prior to recent surgery was been limited due to appts being intermittent, with pt recently returning to treatment now, which will be attempted weekly. Pt does report overall improvement in discomfort L upper quadrant/UE since expander removed and implant placed. Shoulder dysfunction noted bilaterally, which pt reports was present prior to mastectomy, which has further limited shoulder function with increase in pain. Will continue to assess as treatment progresses. Patient will benefit from complete decongestive therapy (CDT) with negative doppler study, including manual lymphatic drainage (MLD) technique, compression garment to be worn daily as instructed, and kinesiotaping as appropriate.  Patient will receive instruction in proper skin care to recognize signs/symptoms of and prevent infection, therapeutic exercise, and self-MLD for independent home program and restorative lymphatic performance.  Pt may also benefit from limited modality use for pain management.  If pain and L shoulder function is not significantly improved within 1-2 months, plan to refer back to orthopedist/oncologist for further assessment.    This care is medically necessary due to the infection  risk with lymphedema, and to improve functional activities.  CDT is necessary to resolve swelling to allow patient to return to wearing normal clothes/footwear, and prevent worsening of symptoms, such as venous stasis ulcerations, infections, or hospitalizations.  Patient will be independent with home program strategies to allow improved ADL ability and mobility and to allow patient to return to greatest functional independence.    Rehabilitation potential is considered to be Good for goals.  Factors which may influence rehabilitation potential include significant pain per pt report continues as  noted previous visits at this time possibly limiting treatment, as well as prior shoulder dysfunction still present, however, pt motivated to improve.  Patient will benefit from 1-2x/week physical therapy visits over 4-8 weeks to optimize improvement in these areas.    PLAN OF CARE:   Recommendations and Planned Interventions:  Manual lymph drainage/complete decongestive therapy  Joint mobilization/manual therapy  Compression garment fitting/provision  Lymphedema therapeutic exercise  AROM/PROM/Strength/Coordination  Self-care training  Education in skin care and lymphedema precautions  Self-MLD education per home program  Self-bandaging education per home program  Caregiver education as needed  Vasopneumatic pump for home use as indicated and tolerated     GOALS  Short term goals  Time frame: 2-4 weeks  1. Instruct the patient to be independent with proper skin care to prevent future skin breakdown and decrease the potential risk for infections that are associated with Lymphedema.  2. Patient will be independent with a personal lymphedema exercise program to assist with the lymphatic flow and reduce L UE limb volume by 50-75%, under 3-5% limb volume discrepancy noted.  3. Patient will understand the signs and symptoms of acute infection.  Long term goals  Time frame: 4-8 weeks  1.   Patient will have knowledge of the compression options and acquire a safe and   appropriate daytime and night time compression system to prevent    re-accumulation of fluid.  2.  Patient or family will be able to don/doff garments independently.  Garment   system effectiveness will be evaluated prior to discharge for better long-term   management and outcomes.  3.  Improvement in UEFS by 4-8 points allowing pt to perform at shoulder level activities with 75% less pain.    4.  L shoulder ROM flex/abd improved by 10-20 degrees with 75% less pain at end range.   5.  Pt independent in self MLD, implant mobilization.  6.   To transition  patient to independent restorative phase of CDT.           Patient has participated in goal setting and agrees to work toward plan of care.  Patient was instructed to call if questions or concerns arise.    Thank you for this referral.  Kalman Shan, PT, CLT    Time Calculation: 52 mins    TREATMENT PLAN EFFECTIVE DATES:   01/15/2014 to 04/12/2014  I have read the above plan of care for [facility].  I certify the above prescribed services are required by this patient and are medically necessary.  The above plan of care has been developed in conjunction with the lymphedema/physical therapist.       Physician Signature: ____________________________________Date:______________

## 2014-01-15 NOTE — Telephone Encounter (Signed)
Spoke with patient and advised of results below, she expressed understanding.

## 2014-01-15 NOTE — Progress Notes (Signed)
Quick Note:    Let her know no DVT thanks  ______

## 2014-01-15 NOTE — Telephone Encounter (Signed)
[facility] called to advise patient has pain in left elbow with increased swelling would like doppler to rule out blood clot. Doppler ordered and scheduled and patient advised.

## 2014-01-15 NOTE — Telephone Encounter (Signed)
-----   Message from Joyce Gross, MD sent at 01/15/2014  2:21 PM EDT -----  Let her know no DVT thanks

## 2014-01-15 NOTE — Telephone Encounter (Signed)
Called patient to advise of results of doppler below per [name], no answer, left message to call back.

## 2014-01-22 NOTE — Telephone Encounter (Signed)
I received a call from the patient. She has been having problems with LEFT arm lymphedema. She has been seeing Patty at LE clinic and is requesting to see Dr. Drucilla Schmidt as well. She sounds very upset. She would like to see Dr. Drucilla Schmidt this Friday however he is not in the office. She is requesting that she see Dr. Drucilla Schmidt on Monday 06/26/25, either before or after her LE session at 11 am. She is having reconstruction surgery by [name] on Thursday 01/30/14. I discussed this with Davy Pique and she is going to contact the patient.

## 2014-01-24 NOTE — Progress Notes (Signed)
[name]  Lymphedema Clinic and Cancer Rehabilitation  [address]  [address]  [place]  [address]      LYmphedema Therapy  Visit: 2    '[x]'$         Daily note               '[]'$        30 day/10th visit progress note    NAME: Emily [name]  DATE: 01/24/2014    GOALS  Short term goals  Time frame: 2-4 weeks  1. Instruct the patient to be independent with proper skin care to prevent future skin breakdown and decrease the potential risk for infections that are associated with Lymphedema.  01/24/2014 GOAL MET  2. Patient will be independent with a personal lymphedema exercise program to assist with the lymphatic flow and reduce L UE limb volume by 50-75%, under 3-5% limb volume discrepancy noted.  01/24/2014 ongoing  3. Patient will understand the signs and symptoms of acute infection.  Long term goals  Time frame: 4-8 weeks  1. Patient will have knowledge of the compression options and acquire a safe and  appropriate daytime and night time compression system to prevent  re-accumulation of fluid. 01/24/2014 fit new compression sleeve--see note below.  2. Patient or family will be able to don/doff garments independently. Garment  system effectiveness will be evaluated prior to discharge for better long-term  management and outcomes.  3. Improvement in UEFS by 4-8 points allowing pt to perform at shoulder level activities with 75% less pain.   4. L shoulder ROM flex/abd improved by 10-20 degrees with 75% less pain at end range.   5. Pt independent in self MLD, implant mobilization.  6. To transition patient to independent restorative phase of CDT.       SUBJECTIVE REPORT:  Pt returns to clinic today, doppler was negative.  Reports L UE swelling persists, with tenderness radial wrist/thumb.     Pain: 6-7/10, axilla/elbow/radial wrist and thumb           Gait:  indep  ADLs:  indep  Treatment Response:  Improvement in limb volume post MLD as noted below.   Reviewed packet received at evaluation.  Function: see eval.    TREATMENT AND OBJECTIVE DATA SUMMARY:       Therapeutic Activity 15 minutes   Treatment time: 12:10-12:22   Compression: Pt arrives in [place] size I sleeve, with reported tightness at proximal aspect of sleeve.  Therapist fit sample Juzo 2001 size I Max compression sleeve in clinic today, with pt reporting improved comfort of sleeve.  Pt advised to wear sleeve fit today exclusively over the weekend and bring to next scheduled appt.  Plan to assess limb volume next scheduled appt and determine from both subjective and objective information size of replacement compression sleeve to order.  Pt verbalizes understanding and agreement with this plan.    na     Manual Therapy 63 minutes   Treatment time: 11:05-12:08 pm   Area to decongest: L UE/upper quadrant   Sequence used and effectiveness: Stim L inguinal/R axillary/bilat supraclavicular lnn.  Activated AAA/PAA/AI L anastomoses.  Full L UE sequence, including medial to lateral technique upper arm, with focus on medial elbow.  Additional time spent  radial wrist/thumb.  Pt expressed improvement in thumb/wrist tension and fullness by 50% post treatment.    Cording techniques axilla/upper arm, gentle approach secondary to tender to touch.  Noted L shoulder abd 95, flex 93 post treatment today, with pt reporting tension axilla/upper arm limiting further movement.  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                    Skin/wound care/debridement: Intact.  Pt to continue daily skin care.                 Kinesiotaping: deferred   Girth/Volume measurement: Limb volume measurements completed upon arrival prior to MLD, with limb volume discrepancy of 9.58% noted.  After MLD, limb volume measurements L UE indicate improvement in discrepancy to 6.04%.  Pt reported above improvement in pain distal UE/hand s/p MLD.     TOTAL TREATMENT 81  mins       ASSESSMENT:   Treatment effectiveness and tolerance: Good response to MLD upper quadrant/UE sequence with limb volume improvement after treatment as noted above.  Consider transitioning pt from [place] size I sleeve to [place] size I Max sleeve, or moving to 3511 size I sleeve, which provides improved containment of lymphedema. Pt presents with fluctuating limb volumes L UE, however, elevated volumes since 01/15/2014 noted.  Note good response to MLD today in clinic.     Progress toward goals: Implemented change in compression garment size.  MLD tolerated well with good documented response to treatment.     PLAN OF CARE:   Changes to the plan of care: Assess response to compression sleeve fit in clinic today.  Reassess L UE limb volume to determine carryover effects of MLD sequence performed in clinic today.  Order replacement compression garment in size indicated by compression garment trial initiated at time of treatment today.   Frequency: 1-2x/week   Other: Pt lives long distance from clinic, which is impeding consistency of treatment at this time.       Kalman Shan, PT, CLT

## 2014-01-27 MED ORDER — SPIRONOLACTONE 25 MG TAB
25 mg | ORAL_TABLET | Freq: Every day | ORAL | Status: DC
Start: 2014-01-27 — End: 2015-03-10

## 2014-01-27 NOTE — Progress Notes (Signed)
HISTORY OF PRESENT ILLNESS  Emily [name] is a 63 y.o. female.  HPI Established patient here today for follow up with questions before next surgery with Dr Bridgett Larsson .The surgery is scheduled for this Thursday, fat graph and nipple reconstruction. She is nervous about this surgery and the affects this will have on her lymphatics. Her pain level is 5-7 on pain scale. Mostly in her arm,axilla hand and elbow area.    ROS    Physical Exam   Cardiovascular: Normal rate and normal heart sounds.    Pulmonary/Chest: Breath sounds normal. Right breast exhibits no inverted nipple, no mass, no nipple discharge, no skin change and no tenderness. Left breast exhibits no inverted nipple, no mass, no nipple discharge, no skin change and no tenderness. Breasts are symmetrical.       Lymphadenopathy:        Right cervical: No superficial cervical, no deep cervical and no posterior cervical adenopathy present.       Left cervical: No superficial cervical, no deep cervical and no posterior cervical adenopathy present.        Right axillary: No pectoral and no lateral adenopathy present.        Left axillary: No pectoral and no lateral adenopathy present.      ASSESSMENT and PLAN  Encounter Diagnoses   Name Primary?   ??? Breast cancer, stage 2, left ([place]) Yes     Having surgery this week, reassured that edema will just remain in arm.  Feels swollen all over.  Will try low dose spironolactone.  Will follow up with PCP regarding BP given hx of pheo.

## 2014-01-27 NOTE — Progress Notes (Signed)
_                                    [facility]  [address]  [address]  [place]  [address]      LYmphedema Therapy  Visit: 3    _0         Daily note               _1        30 day/10th visit progress note    NAME: Emily [name]  DATE: 01/27/2014    GOALS  Short term goals  Time frame: 2-4 weeks  1. Instruct the patient to be independent with proper skin care to prevent future skin breakdown and decrease the potential risk for infections that are associated with Lymphedema.  01/24/2014 GOAL MET  2. Patient will be independent with a personal lymphedema exercise program to assist with the lymphatic flow and reduce L UE limb volume by 50-75%, under 3-5% limb volume discrepancy noted.  01/24/2014 ongoing  3. Patient will understand the signs and symptoms of acute infection.  Long term goals  Time frame: 4-8 weeks  1. Patient will have knowledge of the compression options and acquire a safe and  appropriate daytime and night time compression system to prevent  re-accumulation of fluid. 01/24/2014 fit new compression sleeve--see note below.  2. Patient or family will be able to don/doff garments independently. Garment  system effectiveness will be evaluated prior to discharge for better long-term  management and outcomes.  3. Improvement in UEFS by 4-8 points allowing pt to perform at shoulder level activities with 75% less pain.   4. L shoulder ROM flex/abd improved by 10-20 degrees with 75% less pain at end range.   5. Pt independent in self MLD, implant mobilization.  6. To transition patient to independent restorative phase of CDT.       SUBJECTIVE REPORT:  Pt returns to clinic today stating UE pain improved since last session, with hand/wrist pain radial aspect persistent.     Pain: 6-7/10, axilla/elbow/radial wrist and thumb           Gait:  indep  ADLs:  indep  Treatment Response:  Pt reports improvement hand pain/fullness post MLD.   Noting fluctuating limb volumes, responding to MLD in clinic.    Reviewed packet received at evaluation.  Function: see eval.    TREATMENT AND OBJECTIVE DATA SUMMARY:       Therapeutic Activity 10 minutes   Treatment time: 12:20-12:30   Compression: Applied Kinesiotape to hand/trunk to address regions of concern.  Pt to remove tape carefully, applying soap and water/lotion for easier removal, prior to scheduled surgery.    na     Manual Therapy 58 minutes   Treatment time: 11:20am-12:18 pm   Area to decongest: L UE/upper quadrant   Sequence used and effectiveness: Stim L inguinal/R axillary/bilat supraclavicular lnn.  Activated AAA/PAA/AI L anastomoses.  Full L UE sequence, including medial to lateral technique upper arm, with focus on medial elbow.  Additional time spent radial wrist/thumb.  Pt expressed improvement in thumb/wrist tension and fullness by 50% post treatment.    Cording techniques axilla/upper arm, gentle approach secondary to tender to touch.  Noted L shoulder abd 97, flex 93 post treatment today, with pt reporting tension axilla/upper arm limiting further movement.  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                    Skin/wound care/debridement: Intact.  Pt to continue daily skin care.                 Kinesiotaping: deferred   Girth/Volume measurement: Girth measurements UE below.     TOTAL TREATMENT 70 mins     Affected Side: L UE    Left (cm) Right (cm)   Base 3rd finger 5.5           Palm 18.7           Wrist 15.3           Mid Forearm 21.7           Elbow 23.2           Axilla 25.8           Length                 ASSESSMENT:   Treatment effectiveness and tolerance: Good response to MLD upper quadrant/UE sequence.  Consider transitioning pt from [place] size I sleeve to [place] size I Max sleeve, or moving to 3511 size I sleeve, which provides improved containment of lymphedema. Pt given sample of compression  sleeve in size I max to wear now and post surgery as direction by surgeon. Pt presents with fluctuating limb volumes L UE indicative of lymphedema.  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  Pt has h/o orthopedic concerns regarding bilat shoulder dysfunction, also further impeding ROM which would aide in resolving AWS.  Pt lives in [place], which treatments at this clinic sporadic, limiting progress with therapy, however, pt has made a commitment to come weekly for the next 4-6 weeks.  Pt expressing concern regarding lymphedema and proceeding with reconstructive surgery.  Pt advised to request physician guidance regarding when she can resume compression garment wear post op for lymphedema management L UE.   Progress toward goals: Implemented change in compression garment size.  MLD tolerated well with good documented response to treatment.     PLAN OF CARE:   Changes to the plan of care: Pt to continue wear of sample garment with improved tolerance to larger size reported by pt.  Order replacement compression garment in size indicated by compression garment trial initiated at time of treatment today. Pt to resume compression garment wear L UE post op per surgeon recommendation.  Pt to continue lymphedema specific exercise program within limits of pain.  Pt to continue daily use of vasopneumatic pump as tolerated, requesting guidance from Dr. Geryl Councilman regarding when she can resume pump sequence.  Pt to return to this clinic 02/04/2014 for reassessment and treatment as indicated.   Frequency: 1-2x/week   Other: Pt lives long distance from clinic, which is impeding consistency of treatment at this time.       Kalman Shan, PT, CLT

## 2014-01-28 NOTE — Communication Body (Signed)
HISTORY OF PRESENT ILLNESS  Emily [name] is a 63 y.o. female.  HPI Established patient here today for follow up with questions before next surgery with Dr Bridgett Larsson .The surgery is scheduled for this Thursday, fat graph and nipple reconstruction. She is nervous about this surgery and the affects this will have on her lymphatics. Her pain level is 5-7 on pain scale. Mostly in her arm,axilla hand and elbow area.    ROS    Physical Exam   Cardiovascular: Normal rate and normal heart sounds.    Pulmonary/Chest: Breath sounds normal. Right breast exhibits no inverted nipple, no mass, no nipple discharge, no skin change and no tenderness. Left breast exhibits no inverted nipple, no mass, no nipple discharge, no skin change and no tenderness. Breasts are symmetrical.       Lymphadenopathy:        Right cervical: No superficial cervical, no deep cervical and no posterior cervical adenopathy present.       Left cervical: No superficial cervical, no deep cervical and no posterior cervical adenopathy present.        Right axillary: No pectoral and no lateral adenopathy present.        Left axillary: No pectoral and no lateral adenopathy present.      ASSESSMENT and PLAN  Encounter Diagnoses   Name Primary?   ??? Breast cancer, stage 2, left ([place]) Yes     Having surgery this week, reassured that edema will just remain in arm.  Feels swollen all over.  Will try low dose spironolactone.  Will follow up with PCP regarding BP given hx of pheo.

## 2014-02-04 NOTE — Progress Notes (Signed)
[facility]  [address]  [place]  [address]      Lymphedema Therapy      02/04/2014:  BRENIYAH ROMM was not seen on this date for physical therapy for the following reasons:    [x]          Patient called to cancel the visit for the following reasons: Dr. Alinda Dooms no PT until sutures removed post reconstructive surgery.  []          Patient missed the visit and did not call to cancel.    Doy Hutching

## 2014-02-11 NOTE — Progress Notes (Signed)
[name]  [address]  [address]  [place]  [address]      LYmphedema Therapy  Visit: 4    '[x]'$         Daily note               '[]'$        30 day/10th visit progress note    NAME: Emily [name]  DATE: 02/11/2014    GOALS  Short term goals  Time frame: 2-4 weeks  1. Instruct the patient to be independent with proper skin care to prevent future skin breakdown and decrease the potential risk for infections that are associated with Lymphedema.  01/24/2014 GOAL MET  2. Patient will be independent with a personal lymphedema exercise program to assist with the lymphatic flow and reduce L UE limb volume by 50-75%, under 3-5% limb volume discrepancy noted.  01/24/2014 ongoing  3. Patient will understand the signs and symptoms of acute infection.  Long term goals  Time frame: 4-8 weeks  1. Patient will have knowledge of the compression options and acquire a safe and  appropriate daytime and night time compression system to prevent  re-accumulation of fluid. 01/24/2014 fit new compression sleeve--see note below.  2. Patient or family will be able to don/doff garments independently. Garment  system effectiveness will be evaluated prior to discharge for better long-term  management and outcomes.  3. Improvement in UEFS by 4-8 points allowing pt to perform at shoulder level activities with 75% less pain.   4. L shoulder ROM flex/abd improved by 10-20 degrees with 75% less pain at end range.   5. Pt independent in self MLD, implant mobilization.  6. To transition patient to independent restorative phase of CDT.       SUBJECTIVE REPORT:  Pt returns to clinic today stating she has noted increased swelling L UE since recent revision breast recontruction.  Primary area of concern regarding swelling is thumb web space.  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  Pt reports MD ordered avoiding massage/manual therapy to  anterior upper quadrant. Reports redness upon removal of kinesiotape L lateral trunk prior to surgery.  Requests test strip on hand/arm today stating she believes tape is beneficial for her, and wants to determine if application on extremity is tolerated.              Gait:  indep  ADLs:  indep  Treatment Response:    Pt returns after revision breast reconstruction, including nipple reconstruction, fat grafting.    Reviewed packet received at evaluation.  Function: see eval.    TREATMENT AND OBJECTIVE DATA SUMMARY:       Manual Therapy 60 minutes   Treatment time: 11:20am-12:20 pm   Area to decongest: L UE   Sequence used and effectiveness: MLD L UE only.  Stim cephalic vasa vasorum.  Additional time spend forearm/hand to address thumb webspace involvement.      Cording techniques axilla/upper arm, gentle approach secondary to tender to touch.  Noted L shoulder abd 85, flex 85 post treatment today, with pt reporting tension axilla/upper arm limiting further movement.  Note visible and palpable cording in region with ROM consistent with reported tension and noted limited ROM secondary  to AWS.      Measurements completed for ready made compression sleeve/glove, Juzo 2001 size I Max sleeve, 1101 glove size 1-S, beige.                  Skin/wound care/debridement: Note L nipple site covered.                   Kinesiotaping: Test strip applied L dorsal hand.  Pt to remove tape in 2-3 days, or prior if onset of redness, irritation, itching.   Girth/Volume measurement: Girth measurements UE below.     TOTAL TREATMENT 65 mins     ASSESSMENT:   Treatment effectiveness and tolerance: Pt s/p revision breast reconstruction.  All treatment to address L UE lymphedema/AWS today, with no truncal therapy techniques applied per surgical precautions. Good subjective response to MLD UE sequence. Transitioning pt from [place] size I sleeve to [place] size I Max sleeve with measurements completed and order placed.  Pt given sample of  compression sleeve in size I max to wear now and post surgery as direction by surgeon. Pt presents with fluctuating limb volumes L UE indicative of lymphedema.  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  Pt has h/o orthopedic concerns regarding bilat shoulder dysfunction, also further impeding ROM which would aide in resolving AWS.  Pt lives in [place], which treatments at this clinic sporadic, limiting progress with therapy, however, pt has made a commitment to come weekly for the next 4-6 weeks.  Pt expressing concern regarding lymphedema and proceeding with reconstructive surgery.  Pt advised to request physician guidance regarding when she can resume compression garment wear post op for lymphedema management L UE.   Progress toward goals: Implemented change in compression garment size.  MLD tolerated well with good documented response to treatment.     PLAN OF CARE:   Changes to the plan of care: Pt to continue wear of sample garment with improved tolerance to larger size reported by pt.  Fit replacement compression garments ordered today. Assess response to limited MLD today.  Pt to continue lymphedema specific exercise program within limits of pain and precautions per [name].  Pt to hold vasopneumatic pump per [name].     Frequency: 1-2x/week   Other: Pt lives long distance from clinic, which is impeding consistency of treatment at this time.       Kalman Shan, PT, CLT

## 2014-02-26 NOTE — Progress Notes (Signed)
_                                    [facility]  [address]  [address]  [place]  [address]      LYmphedema Therapy  Visit: 5    _0         Daily note               _1        30 day/10th visit progress note    NAME: Emily [name]  DATE: 02/26/2014    GOALS  Short term goals  Time frame: 2-4 weeks  1. Instruct the patient to be independent with proper skin care to prevent future skin breakdown and decrease the potential risk for infections that are associated with Lymphedema.  01/24/2014 GOAL MET  2. Patient will be independent with a personal lymphedema exercise program to assist with the lymphatic flow and reduce L UE limb volume by 50-75%, under 3-5% limb volume discrepancy noted.  01/24/2014 ongoing  3. Patient will understand the signs and symptoms of acute infection.  Long term goals  Time frame: 4-8 weeks  1. Patient will have knowledge of the compression options and acquire a safe and  appropriate daytime and night time compression system to prevent  re-accumulation of fluid. 01/24/2014 fit new compression sleeve--see note below.  2. Patient or family will be able to don/doff garments independently. Garment  system effectiveness will be evaluated prior to discharge for better long-term  management and outcomes.  3. Improvement in UEFS by 4-8 points allowing pt to perform at shoulder level activities with 75% less pain.   4. L shoulder ROM flex/abd improved by 10-20 degrees with 75% less pain at end range.   5. Pt independent in self MLD, implant mobilization.  6. To transition patient to independent restorative phase of CDT.       SUBJECTIVE REPORT:  Pt returns to clinic today stating she has noted increased swelling L UE since most recent visit.  Primary area of concern regarding swelling is thumb web space.  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  Axillary pain persists, however, states she has been working on getting her R  shoulder ROM back.   Pain:  5-8/10, increased at the end of the day and at end range of available shoulder flex/abd.  Reports taking pain meds at night before bed.           Gait:  indep  ADLs:  indep  Treatment Response:    Pt returns after revision breast reconstruction, including nipple reconstruction, fat grafting.    Reviewed packet received at evaluation.  Function: see eval.    TREATMENT AND OBJECTIVE DATA SUMMARY:       Manual Therapy: 11:28 am-12:30 pm  Therapeutic activity:  11:12-11:25 am 62 minutes  13 minutes      Area to decongest: L UE   Sequence used and effectiveness: Stim R axillary/L inguinal lnn.  Activated AAA/PAA/L AI anastomoses.  Stim Cephalic vasa vasorum.  Full UE sequence including medial to lateral technique upper arm.  Attention to forearm/wrist sequence to address fullness thumb webspace.    STM pect major/minor/subscap/teres major.    Cording techniques axilla/upper arm, gentle approach secondary to tender to touch.  Noted L shoulder abd 95, flex 100 post treatment today, with pt reporting tension  axilla/upper arm limiting further movement.  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                      Skin/wound care/debridement: Note L nipple site healing, steri-strips intact.              Upper extremity compression Finger wraps  Stockinette  Cast padding  Comprilan 6, 8cm x2  Pt instructed to Remove compression if the following occur:  1.  Numbness/tingling increased from what you experience without compression.  2.  Compromise in blood flow, with poor capillary refill, or color changes to toes/fingers.  3.  Onset of pain different from what you may already experience.  4.  Unexplained shortness of breath.  5.  Signs/symptoms of infection including flu-like symptoms, fever, redness, warmth, pain.      Pt advised to remove compression bandaging in 48 hours, examine extremity and assess pain if well  tolerated.  Pt then to return to daily wear of compression sleeve/gauntlet.  Pt to bring laundered bandaging supplies to clinic next scheduled appt. Will determine if further bandaging or addition of night time compression indicated at that time.   Kinesiotaping: deferred   Girth/Volume measurement: Therapeutic activity Limb volume measurements repeated including bilat UE.  Note over 13% discrepancy with decision made to proceed with short trial compression bandaging as noted above.     TOTAL TREATMENT 78 mins     ASSESSMENT:   Treatment effectiveness and tolerance: Pt s/p revision breast reconstruction.  MLD tolerated well with visible improvement noted thumb webspace L. Good subjective response to MLD UE sequence. Improved shoulder ROM noted today.  Transitioning pt from [place] size I sleeve to [place] size I Max sleeve with measurements completed and order placed, awaiting authorization per pt who states she has spoke with [employer] rep.  Bandaging trial initiated today.  Pt given sample of compression sleeve in size I max to wear when bandaging removed. Pt presents with fluctuating limb volumes L UE indicative of lymphedema, increased limb volume discrepancy noted today.  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  Pt has h/o orthopedic concerns regarding bilat shoulder dysfunction, arthritis, also further impeding ROM which would aide in resolving AWS.  Pt lives in [place], which treatments at this clinic sporadic, limiting progress with therapy, however, pt has made a commitment to come weekly for the next 4-6 weeks.    Progress toward goals: Implemented compression bandaging trial today.  MLD tolerated well with good documented response to treatment. Improved ROM L shoulder noted when compared with prior visit.     PLAN OF CARE:   Changes to the plan of care: Pt to resume wear of sample garment after bandaging removed in 48 hours, and subjective UE assessment of pain and swelling  completed by pt.  Fit replacement compression garments upon arrival. Consider night time compression garment.  Assess response to MLD/bandaging trial today today.  Pt to continue lymphedema specific exercise program within limits of pain and precautions per [name].  Pt to hold vasopneumatic pump per [name].     Frequency: 1-2x/week   Other: Pt lives long distance from clinic, which is impeding consistency of treatment at this time.       Kalman Shan, PT, CLT

## 2014-03-05 NOTE — Telephone Encounter (Signed)
Patty from [facility] lymphedema clinic called to report patients appointment with her was cancelled due to work related issues. Patient reported to her left arm over night has increased double in size, denies redness or pain. Follow up with lymphedema clinic is pending. She questioned whether or not patient needed to have doppler to rule out a blood clot. Advised her our office will reach out to patient. No further questions or concerns. ranran

## 2014-03-05 NOTE — Telephone Encounter (Signed)
Returned patient call, no answer, left message to call back.

## 2014-03-06 NOTE — Telephone Encounter (Signed)
Spoke with patient who states lymphedema has worsened in left arm and she is just having a difficult time dealing with this issues. Advised that per [name], she needs to continue seeing Lymphedema clinic every week as she has been doing. Patient expressed understanding.

## 2014-03-07 NOTE — Progress Notes (Signed)
[name]  [facility]hedema [facility] and [facility]  [address]  [address]  [place]  [address]      LYmphedema Therapy  Visit: 6    '[x]'$         Daily note               '[]'$        30 day/10th visit progress note    NAME: Emily [name]  DATE: 03/07/2014    GOALS  Short term goals  Time frame: 2-4 weeks  1. Instruct the patient to be independent with proper skin care to prevent future skin breakdown and decrease the potential risk for infections that are associated with Lymphedema.  01/24/2014 GOAL MET  2. Patient will be independent with a personal lymphedema exercise program to assist with the lymphatic flow and reduce L UE limb volume by 50-75%, under 3-5% limb volume discrepancy noted.  01/24/2014 ongoing  03/07/2014  Increase in limb volume noted today.  Continue goal.  3. Patient will understand the signs and symptoms of acute infection.  Long term goals  Time frame: 4-8 weeks  1. Patient will have knowledge of the compression options and acquire a safe and  appropriate daytime and night time compression system to prevent  re-accumulation of fluid. 01/24/2014 fit new compression sleeve--see note below.  03/07/2014 initiated compression bandaging.    2. Patient or family will be able to don/doff garments independently. Garment  system effectiveness will be evaluated prior to discharge for better long-term  management and outcomes.  3. Improvement in UEFS by 4-8 points allowing pt to perform at shoulder level activities with 75% less pain.   4. L shoulder ROM flex/abd improved by 10-20 degrees with 75% less pain at end range.   5. Pt independent in self MLD, implant mobilization.  6. To transition patient to independent restorative phase of CDT.       SUBJECTIVE REPORT:  Pt returns to clinic today stating she has noted increased swelling L UE since most recent visit, reporting L UE has been "double in size" over the past couple of days. Pt reports  hand involvement resolved after short trial of bandaging last visit, however, forearm/elbow region feeling "tight and heavy".  Pt states she has been wearing her compression sleeve during the day, using Comfy sleeve pump daily, and elevating arm at night while sleeping, with increased size of limb persistent.   Pain:  5-8/10, increased at the end of the day and at end range of available shoulder flex/abd.  Reports taking pain meds at night before bed.           Gait:  indep  ADLs:  indep  Treatment Response:   Note elevated limb volume as noted below.  Phase I CDT including compression bandaging initiated today.   Reviewed packet received at evaluation.  Function: see eval.    TREATMENT AND OBJECTIVE DATA SUMMARY:       Manual Therapy: 11:13 am-12:17 pm 64 minutes      Area to decongest: L UE   Sequence used and effectiveness: Stim R axillary/L inguinal lnn.  Diaphragmatic breathing x 10 initiation and conclusion of MLD.  Activated AAA/PAA/L AI anastomoses.  Stim Cephalic vasa vasorum.  Full UE sequence including medial  to lateral technique upper arm. Stim perforating precollectors.      STM pect major/minor/subscap/teres major.                     Skin/wound care/debridement: Note L nipple site healing.             Upper extremity compression Finger wraps  Stockinette  Cast padding  Comprilan 6, 8cm x2  Pt instructed to Remove compression if the following occur:  1.  Numbness/tingling increased from what you experience without compression.  2.  Compromise in blood flow, with poor capillary refill, or color changes to toes/fingers.  3.  Onset of pain different from what you may already experience.  4.  Unexplained shortness of breath.  5.  Signs/symptoms of infection including flu-like symptoms, fever, redness, warmth, pain.      Pt advised to remove compression bandaging per precautions above, otherwise, to come to next treatment with compression bandaging in place to allow therapist to assess response to  bandaging.  Pt in agreement with plan.    Lymphedema specific exercises reviewed with pt including finger/wrist/elbow flex/ext, wrist circumduction, diaphragmatic breathing x 10 reps to continue 2x/day.    Kinesiotaping: deferred   Girth/Volume measurement: Therapeutic activity Limb volume measurements repeated including bilat UE.  Note over 24% discrepancy, with mild fibrotic changes noted posterior forearm region, skin tension increase wrist to axilla, with hand improvement noted.  Lymphedema progressed to stage II per measurements and skin/tissue changes.     TOTAL TREATMENT 64 mins     ASSESSMENT:   Treatment effectiveness and tolerance: Pt s/p revision breast reconstruction.  MLD tolerated well, with compression bandaging now method of compression required to reduce L UE.  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  Pt has h/o orthopedic concerns regarding bilat shoulder dysfunction, arthritis, also further impeding ROM which would aide in resolving AWS.  Pt lives in [place], which treatments at this clinic sporadic, limiting progress with therapy, however, pt has made a commitment to come 2x/week to address progression of lymphedema.   Progress toward goals: Continuation of compression bandaging today.  MLD tolerated well with good documented response to treatment. See updated goals.     PLAN OF CARE:   Changes to the plan of care: Assess response to compression bandaging.  Continue phase I CDT.     Frequency: 1-2x/week   Other: Pt lives long distance from clinic, which is impeding consistency of treatment at this time.       Kalman Shan, PT, CLT

## 2014-03-08 NOTE — Telephone Encounter (Signed)
ER +ve breast cancer. On Tamoxifen. Complaining of joint pains. Asked her to stop Tamoxifen. Discuss with [name] about trying Letrozole. She has tried the other two AIs. So I am not sure whether this would be tolerable.

## 2014-03-10 NOTE — Telephone Encounter (Signed)
Patient is requesting call back from nurse asap. Pt states she spoke with on-call physician this weekend about not doing well on Tamoxifen. Pt can be reached on her cell phone @ [phone].

## 2014-03-10 NOTE — Telephone Encounter (Signed)
Spoke to patient and informed her that per [name] to continue to hold Tamoxifen and she needed to follow-up in office next week to see how symptoms are after being off Tamoxifen.  Appointment scheduled for 03/18/14 at 10:00 a.m. With [name].  Patient voices understanding and denies any further questions at this time.

## 2014-03-10 NOTE — Telephone Encounter (Signed)
Returned patient's call.  Message left on voicemail to call office back.

## 2014-03-10 NOTE — Telephone Encounter (Signed)
Patient called today, see [name], RN note regarding follow up.

## 2014-03-11 NOTE — Progress Notes (Signed)
[name]  Lymphedema Clinic and Cancer Rehabilitation  [address]  [address]  [place]  [address]      LYmphedema Therapy  Visit: 7    [x]        Daily note               []       30 day/10th visit progress note    NAME: Emily [name]  DATE: 03/11/2014    GOALS  Short term goals  Time frame: 2-4 weeks  1. Instruct the patient to be independent with proper skin care to prevent future skin breakdown and decrease the potential risk for infections that are associated with Lymphedema.  01/24/2014 GOAL MET  2. Patient will be independent with a personal lymphedema exercise program to assist with the lymphatic flow and reduce L UE limb volume by 50-75%, under 3-5% limb volume discrepancy noted.  01/24/2014 ongoing  03/07/2014  Increase in limb volume noted today.  Continue goal.  3. Patient will understand the signs and symptoms of acute infection.  Long term goals  Time frame: 4-8 weeks  1. Patient will have knowledge of the compression options and acquire a safe and  appropriate daytime and night time compression system to prevent  re-accumulation of fluid. 01/24/2014 fit new compression sleeve--see note below.  03/07/2014 initiated compression bandaging.    2. Patient or family will be able to don/doff garments independently. Garment  system effectiveness will be evaluated prior to discharge for better long-term  management and outcomes.  3. Improvement in UEFS by 4-8 points allowing pt to perform at shoulder level activities with 75% less pain.   4. L shoulder ROM flex/abd improved by 10-20 degrees with 75% less pain at end range.   5. Pt independent in self MLD, implant mobilization.  6. To transition patient to independent restorative phase of CDT.       SUBJECTIVE REPORT:  Pt returns to clinic today with compression bandaging in place, stating she tolerated bandaging with mild itching.  States she was able to shower today, covering bandaging with  plastic, with success.     Pain:  5-8/10, increased at the end of the day and at end range of available shoulder flex/abd.  Reports taking pain meds at night before bed.           Gait:  indep  ADLs:  indep  Treatment Response:   Note elevated limb volume as noted below.  Phase I CDT including compression bandaging initiated today.   Reviewed packet received at evaluation.  Function: see eval.    TREATMENT AND OBJECTIVE DATA SUMMARY:       Manual Therapy: 1:10-2:11 pm  61 minutes    PCS:  1:00-1:09 pm--therapist assisted pt with removing compression bandaging with UE cleansed with body wipes.  Pt washed hands in sink prior to treatment.  Area to decongest: L UE   Sequence used and effectiveness: Note visible fullness proximal to L elbow, posterior forearm upon removal of compression bandaging.    Stim R axillary/L inguinal lnn. Stim perforating precollectors. Diaphragmatic breathing x 10 initiation and conclusion of MLD.  Activated AAA/PAA/L AI anastomoses.  Stim Cephalic vasa vasorum.  Full UE sequence including medial to lateral technique upper arm.  STM pect major/minor/subscap/teres major. 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                    Skin/wound care/debridement: Note L nipple site healing. Mild redness/irritation antecubital fossa with c/o mild itching.  Eucerin lotion applied L UE, with barrier zinc prep applied to antecubital fossa and extremity with pt reporting immediate improvement in itching.            Upper extremity compression Finger wraps  Stockinette  Cast padding  Komprex II posterior proximal UE  Velfoam piece antecubital fossa  Comprilan 6, 8cm x2  Pt instructed to Remove compression if the following occur:  1.  Numbness/tingling increased from what you experience without compression.  2.  Compromise in blood flow, with poor capillary refill, or color changes to toes/fingers.  3.  Onset of pain different from what you may already experience.  4.  Unexplained  shortness of breath.  5.  Signs/symptoms of infection including flu-like symptoms, fever, redness, warmth, pain.      Pt advised to remove compression bandaging per precautions above, otherwise, to come to next treatment with compression bandaging in place to allow therapist to assess response to bandaging.  Pt in agreement with plan.    Lymphedema specific exercises reviewed with pt including finger/wrist/elbow flex/ext, wrist circumduction, diaphragmatic breathing x 10 reps, adding shoulder flex to 90, to continue 2x/day.    Kinesiotaping: deferred   Girth/Volume measurement: Therapeutic activity UE girth measurements as noted below.   Affected Side: L UE    Left (cm) Right (cm)   Base 3rd finger 5.2           Palm 18.5           Wrist 15.4           Mid Forearm 23.8           Elbow 24.8           Axilla 27.3           Length                 TOTAL TREATMENT 71 mins     ASSESSMENT:   Treatment effectiveness and tolerance: Pt s/p revision breast reconstruction.  MLD tolerated well, with compression bandaging continuing to be method of compression required to reduce L UE, with stage II lymphedema noted, mild fibrotic changes. 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  Pt has h/o orthopedic concerns regarding bilat shoulder dysfunction, arthritis, also further impeding ROM which would aide in resolving AWS.  Pt lives in [place], which treatments at this clinic sporadic, limiting progress with therapy, however, pt has made a commitment to come 2x/week to address progression of lymphedema.   Progress toward goals: Continuation of compression bandaging today.  MLD tolerated well with good documented response to treatment.      PLAN OF CARE:   Changes to the plan of care: Assess response to compression bandaging, MLD, skin care, exericse.  Continue phase I CDT.     Frequency: 1-2x/week   Other: Pt lives long distance from clinic, which is impeding consistency of treatment at this time.       Kalman Shan, PT, CLT

## 2014-03-14 NOTE — Progress Notes (Signed)
_                                    [facility]  [address]  [address]  [place]  [address]      LYmphedema Therapy  Visit: 8    '[x]'$         Daily note               '[]'$        30 day/10th visit progress note    NAME: Emily [name]  DATE: 03/14/2014    GOALS  Short term goals  Time frame: 2-4 weeks  1. Instruct the patient to be independent with proper skin care to prevent future skin breakdown and decrease the potential risk for infections that are associated with Lymphedema.  01/24/2014 GOAL MET  2. Patient will be independent with a personal lymphedema exercise program to assist with the lymphatic flow and reduce L UE limb volume by 50-75%, under 3-5% limb volume discrepancy noted.  01/24/2014 ongoing  03/07/2014  Increase in limb volume noted today.  Continue goal.  3. Patient will understand the signs and symptoms of acute infection.   Long term goals  Time frame: 4-8 weeks  1. Patient will have knowledge of the compression options and acquire a safe and  appropriate daytime and night time compression system to prevent  re-accumulation of fluid. 01/24/2014 fit new compression sleeve--see note below.  03/07/2014 initiated compression bandaging.    2. Patient or family will be able to don/doff garments independently. Garment  system effectiveness will be evaluated prior to discharge for better long-term  management and outcomes.  3. Improvement in UEFS by 4-8 points allowing pt to perform at shoulder level activities with 75% less pain.   4. L shoulder ROM flex/abd improved by 10-20 degrees with 75% less pain at end range.   5. Pt independent in self MLD, implant mobilization.  6. To transition patient to independent restorative phase of CDT.       SUBJECTIVE REPORT:  Pt returns to clinic today with compression bandaging in place, stating she tolerated bandaging well with improvement in itching with addition of zinc preparation with skin care.    Pain:  5-8/10, increased at the end of the day and at end range of available shoulder flex/abd.  Reports taking pain meds at night before bed.           Gait:  indep  ADLs:  indep  Treatment Response:   Note continued elevated limb volume with repeat measurements as noted below.  Phase I CDT including compression bandaging continued today.   Reviewed packet received at evaluation.  Function: see eval.    TREATMENT AND OBJECTIVE DATA SUMMARY:       Therapeutic activity: 11:18-11:30 am  Manual Therapy: 11:32 am-12:30 pm  12 minutes  58 minutes     Area to decongest: L UE   Sequence used and effectiveness: Note visible fullness distal elbow.  Upper arm improved in appearance.  Hand involvement remains significantly improved.    Stim R axillary/L inguinal lnn. Stim perforating precollectors. Diaphragmatic breathing x 10 initiation and conclusion of MLD.  Activated AAA/PAA/L AI anastomoses.  Stim Cephalic vasa vasorum.  Full UE sequence including medial to lateral technique upper arm.  Fibrosis technique forearm.     STM pect major/minor/subscap/teres major. 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                    Skin/wound care/debridement: Note L nipple site healing. UE skin condition approved, with itching/irritation noted at antecubital fossa prior treatment resolved with addition of zinc skin barrier.  Eucerin lotion applied L UE, with barrier zinc prep applied to antecubital fossa and extremity with pt reporting immediate improvement in itching.            Upper extremity compression Finger wraps  Stockinette  Cast padding  Komprex II posterior proximal UE  Velfoam piece antecubital fossa  Comprilan 6, 8cm x2  Pt instructed to Remove compression if the following occur:  1.  Numbness/tingling increased from what you experience without compression.  2.  Compromise in blood flow, with poor capillary refill, or color changes to toes/fingers.  3.  Onset of pain different from what you may  already experience.  4.  Unexplained shortness of breath.  5.  Signs/symptoms of infection including flu-like symptoms, fever, redness, warmth, pain.      Pt advised to remove compression bandaging per precautions above, otherwise, to come to next treatment with compression bandaging in place to allow therapist to assess response to bandaging.  Pt in agreement with plan.    Lymphedema specific exercises reviewed with pt including finger/wrist/elbow flex/ext, wrist circumduction, diaphragmatic breathing x 10 reps, adding shoulder flex to 90, to continue 2x/day.    Kinesiotaping: deferred   Volume measurement: Therapeutic Activity See scanned graph.  Limb volume discrepancy decreased from 24% to 21%.  Fibrotic changes continued to be a concern, forearm/elbow region most affected.     TOTAL TREATMENT 72 mins     ASSESSMENT:   Treatment effectiveness and tolerance: Pt s/p revision breast reconstruction.  MLD tolerated well, with compression bandaging continuing to be method of compression required to reduce L UE, with stage II lymphedema noted, mild fibrotic changes. 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  Pt has h/o orthopedic concerns regarding bilat shoulder dysfunction, arthritis, also further impeding ROM which would aide in resolving AWS.  Pt lives in [place], which treatments at this clinic sporadic, limiting progress with therapy, however, pt has made a commitment to come 2x/week to address progression of lymphedema.   Progress toward goals: Continuation of compression bandaging today.  MLD tolerated well with good documented response to treatment.      PLAN OF CARE:   Changes to the plan of care: Assess response to compression bandaging, MLD, skin care, exericise.  Continue phase I CDT.     Frequency: 1-2x/week   Other: Pt lives long distance from clinic, which is impeding consistency of treatment at this time.       Kalman Shan, PT, CLT

## 2014-03-17 MED ORDER — DIAZEPAM 5 MG TAB
5 mg | ORAL_TABLET | Freq: Four times a day (QID) | ORAL | Status: DC | PRN
Start: 2014-03-17 — End: 2014-05-27

## 2014-03-17 NOTE — Telephone Encounter (Signed)
Received request from [facility] to refill patient's diazepam 5 mg.      Per Dr. Drucilla Schmidt, refilled this for #50 with one refill.  Called to [facility] at [phone].

## 2014-03-19 MED ORDER — LETROZOLE 2.5 MG TAB
2.5 mg | ORAL_TABLET | Freq: Every day | ORAL | Status: DC
Start: 2014-03-19 — End: 2014-09-18

## 2014-03-19 NOTE — Telephone Encounter (Signed)
Spoke to [place] at [facility] who states that patient's Letrozole is requiring a prior authorization.  [facility] to fax prior authorization information to our office.

## 2014-03-19 NOTE — Patient Instructions (Addendum)
MRI brain    Letrozole 2.5 mg daily, start in 4 days    Return in July    Vit d3 2000 international units daily

## 2014-03-19 NOTE — Progress Notes (Addendum)
[facility]  [address] [place]   [address]  W: [phone]   F: [phone]      f/u HEME/ONC CONSULT      Reason for visit: evaluation for treatment for  Breast Cancer    Consulting physician:  Dr. Gilford Rile, Dr. Raynald Blend, Brooke Bonito.  Ref physician:  Dr. Drucilla Schmidt    HPI:   Emily [name] is a 63 y.o.  female who I am seeing for adjuvant therapy for breast cancer.        She had a screening ultrasound on 09/24/12 showing a 2.6 cm left LN.  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  HER 2 negative IHC at 0.    She has a history of a pheochromocytoma surgically removed in 1997.    She then had a MRI breast on 10/05/12 showing the 2.8 x 2 cm LN as well as a left breast LIQ 0.9 cm x 1.1 cm x 0.6 cm mass.  Biopsy of that mass by FNA on 10/16/12 shows a poorly differentiated carcinoma.  These slides were air-dried, HER 2 not able to be performed on them.    Mammaprint of LN shows high risk luminal, ER + at 0.19, PR + at 0.61, HER 2 negative at -0.69.    She underwent L mastectomy on 11/28/12 showing IDC with neuroendocrine features, gr 2, 0.5 cm, DCIS gr 2-3, + LVI, 1/14 LN + with a 1 cm met, no ECE.  HER 2 negative (ratio 1.2, sig/cell 2.8), ER + 95%, PR + 5%; ki67 5%; DCIS ER + 95%, PR + 1%.    S/p TC q 3 weeks x 4 from 12/25/12-02/26/13 (75 mg/m2; 600 mg/m2)    S/p XRT from 04/02/13-05/10/13    Started anastrozole 05/17/13, stop 07/30/13 (joint pains)    exemestane 08/06/13-09/30/13 (joint pains)    Started zometa 07/10/13, q 6 months x 3 years    Started tamoxifen 09/30/13, stopped on 03/08/14 due to joint pains    Interval history: complains of gr 1 constipation, gr 1 fatigue, gr 1 hot flashes, gr 1 insomnia, gr 3  anxiety/depression, 7/10 pain in her left arm for lymphedema, gr 2 headache.  Gr 2 loss of libido.    Headaches are new -- left temporal -- new in past 6 weeks;daily, but intermittent, worsening    Called the on-call doctor on 03/08/14 complaining of joint pains and was instructed to stop her tamoxifen.  Feels better off of tamoxifen.    Is having a flare in her left arm due to lymphedema, is being seen twice a week    LMP 1996.      DX   Encounter Diagnosis   Name Primary?   ??? Breast cancer, stage 2, left ([place]) Yes      Past Medical History   Diagnosis Date   ??? Cancer ([place])      left breast  cancer   ??? Anxiety    ??? Arthritis    ??? Nausea & vomiting    ??? Concussion 2011   ??? Chronic tension headaches    ??? Headache(784.0)      OCCAS   ??? Other ill-defined conditions(799.89)      PHEOCHROMOCYTOMA   ??? Other and unspecified  symptoms and signs involving general sensations and perceptions      HIVES RECENTLY- WAS IN ER Feb 19, 2013   ??? Lymphedema of arm      left     Past Surgical History   Procedure Laterality Date   ??? Hx orthopaedic       bilateral shoulder arthroscopy   ??? Hx modified radical mastectomy  11/28/2012     LEFT BREAST MODIFIED RADICAL MASTECTOMY WITH AXILLARY DISSECTION W/ RECONSTRUCTION LEFT BREAST WITH TISSUE EXPANDER AND ALLODERM;PORTACATH INSERTION performed by Ty Hilts, MD at [place]   ??? Hx gyn       c-section   ??? Hx oophorectomy       right   ??? Hx heent       T&A   ??? Hx heent       right adrenal removed   ??? Pr abdomen surgery proc unlisted       hernia mesh repair   ??? Hx appendectomy     ??? Pr abdomen surgery proc unlisted       right adenal removed   ??? Hx vascular access       PORT, THEN REMOVED   ??? Pr breast surgery procedure unlisted  '91     breast implants; replaced bilat implants 2011   ??? Hx breast reconstruction  11/28/2012     BREAST RECONSTRUCTION /C  INSERTION EXPANDER & ALLODERM performed by Sharlotte Alamo, MD at [place]   ??? Hx mastectomy Left    ??? Hx breast  reconstruction Bilateral 09/16/2013     REMOVAL AND REPLACEMENT LEFT BREAST IMPLANT TISSUE EXPANDERS WITH SUBTOTAL CAPSULECTOMY, RIGHT BREAST IMPLANT REMOVAL AND REPLACEMENT FOR SYMMETRY/FAT GRAFTING TO BILATERAL BREASTS performed by Sharlotte Alamo, MD at [place] [facility]     History     Social History   ??? Marital Status: SINGLE     Spouse Name: N/A     Number of Children: N/A   ??? Years of Education: N/A     Social History Main Topics   ??? Smoking status: Never Smoker    ??? Smokeless tobacco: Never Used   ??? Alcohol Use: No   ??? Drug Use: No   ??? Sexual Activity: Not on file     Other Topics Concern   ??? Not on file     Social History Narrative     Family History   Problem Relation Age of Onset   ??? Alzheimer Mother    ??? Diabetes Mother    ??? Other Mother      COMBATIVE POST ANESTHESIA   ??? Heart Disease Father    ??? Diabetes Father    ??? Lung Disease Father      COPD   ??? Headache Sister    ??? Migraines Sister    ??? Anesth Problems Neg Hx        Current Outpatient Prescriptions   Medication Sig Dispense Refill   ??? letrozole (FEMARA) 2.5 mg tablet Take 1 Tab by mouth daily. 90 Tab 3   ??? spironolactone (ALDACTONE) 25 mg tablet Take 1 Tab by mouth daily. 30 Tab 3   ??? escitalopram oxalate (LEXAPRO) 20 mg tablet TAKE 1 TABLET BY MOUTH DAILY. 30 Tab 3   ??? desloratadine (CLARINEX) 5 mg tablet Take 5 mg by mouth daily.     ??? DOCUSATE SODIUM (COLACE PO) Take 1 Tab by mouth daily.     ??? diazepam (VALIUM) 5 mg  tablet Take 1 Tab by mouth every six (6) hours as needed for Anxiety (muscle spasm). Max Daily Amount: 20 mg. 50 Tab 2   ??? oxyCODONE-acetaminophen (PERCOCET) 5-325 mg per tablet Take 2 tablets by mouth every four (4) hours as needed. 80 tablet 0       Allergies   Allergen Reactions   ??? Aspirin Unknown (comments)     Gi bleeding   ??? Compazine [Prochlorperazine] Rash   ??? Keflex [Cephalexin] Rash   ??? Levaquin [Levofloxacin] Hives     DENIES ALLERGY   ??? Nsaids (Non-Steroidal Anti-Inflammatory Drug) Swelling     Throat swells   ??? Prednisone  Rash     DENIES ALLERGY       Review of Systems    A comprehensive review of systems was performed and all systems were negative except for HPI and for the symptom report form, reviewed and scanned in.      Objective:  Physical Exam:  BP 130/68 mmHg   Pulse 61   Temp(Src) 98.3 ??F (36.8 ??C)   Resp 18   Ht 5' 6" (1.676 m)   Wt 129 lb (58.514 kg)   BMI 20.83 kg/m2   SpO2 98%    General:  Alert, cooperative, no distress, appears stated age.   Head:  Normocephalic, without obvious abnormality, atraumatic.   Eyes:  Conjunctivae/corneas clear. PERRL, EOMs intact.   Throat: Lips, mucosa, and tongue normal.    Neck: Supple, symmetrical, trachea midline, no adenopathy, thyroid: no enlargement/tenderness/nodules   Back:   Symmetric, no curvature. ROM normal. No CVA tenderness.   Lungs:   Clear to auscultation bilaterally.   Chest wall:  No tenderness or deformity.   Heart:  Regular rate and rhythm, S1, S2 normal, no murmur, click, rub or gallop.   Abdomen:   Soft, non-tender. Bowel sounds normal. No masses,  No organomegaly.   Extremities: Extremities normal, atraumatic, no cyanosis or edema.   Skin: No rash   Lymph nodes: Cervical, supraclavicular, and axillary nodes normal.   Neurologic: CNII-XII intact.       Diagnostic Imaging   Results for orders placed during the hospital encounter of 10/16/13   XR KNEE RT 3 V    Narrative **Final Report**       ICD Codes / Adm.Diagnosis: 719.46   / Pain in joint, lower leg    Examination:  CR KNEE 3 VWS RT  - [id number] - Oct 16 2013  1:52PM  Accession No:  [id number]  Reason:  719.46      REPORT:  EXAM:  CR KNEE 3 VWS RT    INDICATION:   719.46    COMPARISON:     .    FINDINGS: Three views of the right knee demonstrate no fracture or other   acute osseous or articular abnormality.  There is a small joint effusion.    R. Spurring of the patella mild medial joint space narrowing, the bone is   normal in mineral content.        IMPRESSION:  Mild DJD.              Signing/Reading Doctor: Lannette Donath [phone])    Approved: Lannette Donath [phone])  Oct 16 2013  2:11PM                                     Results for orders placed in visit  on 10/22/12   PET BREAST CANCER DIAGNOSIS   10/12/12 PET:  Negative for distant mets  10/22/12 brain MRI:  Negative    09/24/12 dexa L hip T -2.5; L spine T -2.3  osteoporosis    12/11/13 bilateral MRI  Negative    Lab Results  Lab Results   Component Value Date/Time    WBC 4.7 09/09/2013 10:19 AM    HGB 12.2 09/09/2013 10:19 AM    HCT 37.0 09/09/2013 10:19 AM    PLATELET 253 09/09/2013 10:19 AM    MCV 88.7 09/09/2013 10:19 AM       Lab Results   Component Value Date/Time    SODIUM 144 02/05/2013 09:44 AM    POTASSIUM 4.4 02/05/2013 09:44 AM    CHLORIDE 105 02/05/2013 09:44 AM    CO2 29 02/05/2013 09:44 AM    ANION GAP 10 02/05/2013 09:44 AM    GLUCOSE 101 02/05/2013 09:44 AM    BUN 14 02/05/2013 09:44 AM    CREATININE 0.32 02/05/2013 09:44 AM    BUN/CREATININE RATIO 44 02/05/2013 09:44 AM    GFR EST AA >60 02/05/2013 09:44 AM    GFR EST NON-AA >60 02/05/2013 09:44 AM    CALCIUM 8.8 02/05/2013 09:44 AM    ALT 52 02/05/2013 09:44 AM    AST 22 02/05/2013 09:44 AM    ALK. PHOSPHATASE 136 02/05/2013 09:44 AM    PROTEIN, TOTAL 6.2 02/05/2013 09:44 AM    ALBUMIN 3.8 02/05/2013 09:44 AM    GLOBULIN 2.4 02/05/2013 09:44 AM    A-G RATIO 1.6 02/05/2013 09:44 AM     Assessment/Plan:  63 y.o. female with a 0.5 cm left breast cancer, IDC with endocrine features, 1/14 LN +, gr 2, high risk mammaprint,  ER and PR +, HER 2 negative. PS 0    1. Breast cancer with neuroendocrine features, stage IIA, [id number]    With no further therapy, her RR is about 36%; endocrine therapy will decrease her RR to 19%; and 2nd gen chemo will decrease her RR to 12%; 3rd gen to 10%.  These #s may be higher with her high risk mammaprint.     Based on her pheo and now breast cancer history, I recommended genetic counseling -- referral to [facility] made at prior visit. Has seen them and they have performed BRCA testing  -- negative.  Will perform myriad MyRisk today    No evidence of recurrence.  Will change from tamoxifen to letrozole 2.5 mg daily.    The risks and benefits of aromatase inhibitors (anastrozole, letrozole, and exemestane) were discussed in detail and the patient was informed of the following: Risks include the development of painful muscles and joints (arthralgia/myalgia) and bone loss. Muscle and joint pain can be severe but rarely result in any tissue damage; symptoms usually resolve in several weeks when the medication is stopped. Bone loss is common and a bone density test is recommended as a baseline and then yearly to every several years depending on initial results. The risk of fractures is increased by a few percent in patients taking these drugs, but careful monitoring of bone density and using bone protecting agents when indicated can minimize these risks. Unlike tamoxifen there is no increased risk of blood clots or endometrial cancer. AIs can cause or worsen vaginal dryness but women using these drugs should not use vaginal estrogen preparations for these symptoms. AIs can also cause or increase hot flashes. Any other symptoms should be reported.  2. Anxiety/Depression: improved today; continue lexapro 20 mg daily.   Valium prn.  Renee Ramus met with her today.  We discussed possibly changing this to another SSRI at her next visit, depending on how she does no letrozole    3. Osteoporosis:  We discussed the data from [place], [place] 2013, for the meta-analysis for adjuvant bisphosphonate use in breast cancer.  We discussed that in postmenopausal women, it appears that the bisphosphate zolendronic acid, given as in ABCSG 12 at 4 mg IV q 6 months x 3 years, is beneficial in improving bone DFS and OS.  We discussed side effects such as ONJ and the need to avoid invasive dental procedures while on these medications, renal damage, and hypocalcemia.  Started on 07/10/13 without problems, but she is  highly anxious today about getting stuck and so we will delay her next dose for now.    Will change to prolia 60 mg SC q 6 months in July    Vit D3 2000 International Units daily    4. Joint pains: also has RA; improved off of tamoxifen    5. Fibromyalgia/RA:  Too sedated on lyrica    6. Headaches:  Brain MRI, worse    > 40 minutes were spent with this patient with > 50% of that time spent in face to face counseling.          Follow-up Disposition:  Return in about 5 weeks (around 04/23/2014).    Sonda Rumble, MD

## 2014-03-19 NOTE — Telephone Encounter (Signed)
Todd from [facility] is calling about patients Letrazole prescription. Call back number [phone]

## 2014-03-19 NOTE — Progress Notes (Signed)
_                                    [facility]  [address]  [address]  [place]  [address]      LYmphedema Therapy  Visit: 9    '[x]'$         Daily note               '[]'$        30 day/10th visit progress note    NAME: Emily [name]  DATE: 03/19/2014    GOALS  Short term goals  Time frame: 2-4 weeks  1. Instruct the patient to be independent with proper skin care to prevent future skin breakdown and decrease the potential risk for infections that are associated with Lymphedema.  01/24/2014 GOAL MET  2. Patient will be independent with a personal lymphedema exercise program to assist with the lymphatic flow and reduce L UE limb volume by 50-75%, under 3-5% limb volume discrepancy noted.  01/24/2014 ongoing  03/07/2014  Increase in limb volume noted today.  Continue goal.  3. Patient will understand the signs and symptoms of acute infection.   Long term goals  Time frame: 4-8 weeks  1. Patient will have knowledge of the compression options and acquire a safe and  appropriate daytime and night time compression system to prevent  re-accumulation of fluid. 01/24/2014 fit new compression sleeve--see note below.  03/07/2014 initiated compression bandaging.    2. Patient or family will be able to don/doff garments independently. Garment  system effectiveness will be evaluated prior to discharge for better long-term  management and outcomes.  3. Improvement in UEFS by 4-8 points allowing pt to perform at shoulder level activities with 75% less pain.   4. L shoulder ROM flex/abd improved by 10-20 degrees with 75% less pain at end range.   5. Pt independent in self MLD, implant mobilization.  6. To transition patient to independent restorative phase of CDT.       SUBJECTIVE REPORT:  Pt returns to clinic today with compression bandaging in place, stating she tolerated bandaging well with improvement in itching with addition of zinc preparation with skin care.  Pt reports plan of medication change from Tamoxifen to Letrozole secondary to severe joint/muscle pains.   Pain:  5-8/10, increased at the end of the day and at end range of available shoulder flex/abd.  Reports taking pain meds at night before bed.           Gait:  indep  ADLs:  indep  Treatment Response:   Note continued elevated limb volume with repeat measurements as noted below.  Phase I CDT including compression bandaging continued today.   Reviewed packet received at evaluation.  Function: see eval.    TREATMENT AND OBJECTIVE DATA SUMMARY:       Manual therapy:  1:00-2:12 pm 72 minutes     Area to decongest: L UE   Sequence used and effectiveness: Note visible fullness distal elbow remains, however, improved.  Upper arm improved in appearance with primary area of fullness medial aspect just superior to elbow.  Hand involvement remains significantly improved.    Stim R axillary/L inguinal lnn.  Diaphragmatic breathing x 10 initiation and conclusion of MLD.  Activated AAA/PAA/L AI anastomoses.  Stim Cephalic vasa vasorum.  Full UE sequence including medial to  lateral technique upper arm.  Fibrosis technique forearm.     STM pect major/minor/subscap/teres major. 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  Shoulder abd 95, flex 98 degrees at conclusion of treatment.                    Skin/wound care/debridement: Note L nipple site healing. UE skin condition approved, with itching/irritation noted at antecubital fossa prior treatment remaining resolved with addition of zinc skin barrier.  Eucerin lotion applied L UE, with barrier zinc prep applied to antecubital fossa and extremity with pt reporting immediate improvement in itching.            Upper extremity compression Finger wraps  Stockinette  Cast padding  Komprex II posterior proximal UE  Velfoam piece antecubital fossa  Comprilan 6, 8cm x2  Pt instructed to Remove compression if the following occur:  1.  Numbness/tingling increased from  what you experience without compression.  2.  Compromise in blood flow, with poor capillary refill, or color changes to toes/fingers.  3.  Onset of pain different from what you may already experience.  4.  Unexplained shortness of breath.  5.  Signs/symptoms of infection including flu-like symptoms, fever, redness, warmth, pain.      Pt advised to remove compression bandaging per precautions above, otherwise, to come to next treatment with compression bandaging in place to allow therapist to assess response to bandaging.  Pt in agreement with plan.    Lymphedema specific exercises reviewed with pt including finger/wrist/elbow flex/ext, wrist circumduction, diaphragmatic breathing x 10 reps, adding shoulder flex to 90, to continue 2x/day.    Kinesiotaping: deferred   Volume measurement: Therapeutic Activity See UE girth measurements noted below.   Affected Side: L UE    Left (cm) Right (cm)   Base 3rd finger 5.3           Palm 18.7           Wrist 15.2           Mid Forearm 24.4           Elbow 25.3           Axilla 28.7           Length                 TOTAL TREATMENT 72 mins     ASSESSMENT:   Treatment effectiveness and tolerance: Pt s/p revision breast reconstruction.  MLD tolerated well, with compression bandaging continuing to be method of compression required to reduce L UE, with stage II lymphedema noted, mild fibrotic changes. 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  Pt has h/o orthopedic concerns regarding bilat shoulder dysfunction, arthritis, also further impeding ROM which would aide in resolving AWS.  Pt lives in [place], which treatments at this clinic sporadic, limiting progress with therapy, however, pt has made a commitment to come 2x/week to address progression of lymphedema.   Progress toward goals: Continuation of compression bandaging today.  MLD tolerated well with good documented response to treatment. Continued conversation regarding need to add night time compression to  home management phase of routine upon transition to compression garments.     PLAN OF CARE:   Changes to the plan of care: Assess response to compression bandaging, MLD, skin care, exericise.  Continue phase I CDT.  Reassess limb volumes and measure for garments as indicated.   Frequency: 1-2x/week   Other: Pt lives long distance from clinic,  which is impeding consistency of treatment at this time.       Kalman Shan, PT, CLT

## 2014-03-19 NOTE — Progress Notes (Signed)
Emily [name] is a 63 y.o. female here for follow up of breast cancer.

## 2014-03-20 NOTE — Telephone Encounter (Signed)
Prior auth submitted and approved, advised [facility] and it is filled and ready for pick up.

## 2014-03-21 NOTE — Progress Notes (Signed)
_                                    [facility]  [address]  [address]  [place]  [address]      LYmphedema Therapy  Visit: 10    '[]'$         Daily note               '[x]'$        30 day/10th visit progress note    NAME: Emily [name]  DATE: 03/21/2014    GOALS  Short term goals  Time frame: 2-4 weeks  1. Instruct the patient to be independent with proper skin care to prevent future skin breakdown and decrease the potential risk for infections that are associated with Lymphedema.  01/24/2014 GOAL MET  2. Patient will be independent with a personal lymphedema exercise program to assist with the lymphatic flow and reduce L UE limb volume by 50-75%, under 3-5% limb volume discrepancy noted.  01/24/2014 ongoing  03/07/2014  Increase in limb volume noted today.  Continue goal.  03/21/2014  Limb volume discrepancy remains over 20% at this time.  3. Patient will understand the signs and symptoms of acute infection.   Long term goals  Time frame: 4-8 weeks  1. Patient will have knowledge of the compression options and acquire a safe and  appropriate daytime and night time compression system to prevent  re-accumulation of fluid. 01/24/2014 fit new compression sleeve--see note below.  03/07/2014 initiated compression bandaging.  03/21/2014 continuing compression bandaging  2. Patient or family will be able to don/doff garments independently. Garment  system effectiveness will be evaluated prior to discharge for better long-term  management and outcomes. 03/21/2014 ongoing education regarding compression garments options when indicated per progress with treatment.  3. Improvement in UEFS by 4-8 points allowing pt to perform at shoulder level activities with 75% less pain.   4. L shoulder ROM flex/abd improved by 10-20 degrees with 75% less pain at end range.  03/21/2014  Improvement in shoulder ROM noted.  Pain persists.  5. Pt independent in self MLD, implant  mobilization.  6. To transition patient to independent restorative phase of CDT.       SUBJECTIVE REPORT:  Pt returns to clinic today with compression bandaging in place, stating she tolerated bandaging well with improvement in itching with addition of zinc preparation with skin care. Pt reports plan of medication change from Tamoxifen to Letrozole secondary to severe joint/muscle pains.  Continues to note swelling L UE.   Pain:  5-8/10, increased at the end of the day and at end range of available shoulder flex/abd.  Reports taking pain meds at night before bed.           Gait:  indep  ADLs:  indep  Treatment Response:   Note continued elevated limb volume with repeat measurements as noted below.  Phase I CDT including compression bandaging continued today.   Reviewed packet received at evaluation.  Function: see eval.    TREATMENT AND OBJECTIVE DATA SUMMARY:       Manual therapy:  9:45-10:47 pm  Therapeutic activity: 9:32-9:44 am 62 minutes  12 minutes     Area to decongest: L UE   Sequence used and effectiveness: Note visible fullness distal forearm today, however, improved.  Upper arm improved in  appearance with primary area of fullness medial aspect just superior to elbow.  Hand involvement remains significantly improved. Fullness axilla, question seroma.  Improved with MLD today.    Short neck. Stim R axillary/L inguinal lnn.  Diaphragmatic breathing x 10 initiation and conclusion of MLD.  Activated AAA/PAA/L AI anastomoses.  Perforating precollectors stim.  Additonal time spend on truncal sequence.  Stim Cephalic vasa vasorum.  Full UE sequence including medial to lateral technique upper arm.  Fibrosis technique forearm.     STM pect major/minor/subscap/teres major. 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  Shoulder abd 96, flex 98 degrees at conclusion of treatment.                    Skin/wound care/debridement: Note L nipple site healing. UE skin condition approved, with  itching/irritation noted at antecubital fossa prior treatment remaining resolved with addition of zinc skin barrier.  Eucerin lotion applied L UE, with barrier zinc prep applied to antecubital fossa and extremity with pt reporting immediate improvement in itching.            Upper extremity compression Held finger wraps today  Stockinette  Cast padding  Komprex II posterior proximal UE  Velfoam piece antecubital fossa  Comprilan 6, 8, 10 cm   Pt instructed to Remove compression if the following occur:  1.  Numbness/tingling increased from what you experience without compression.  2.  Compromise in blood flow, with poor capillary refill, or color changes to toes/fingers.  3.  Onset of pain different from what you may already experience.  4.  Unexplained shortness of breath.  5.  Signs/symptoms of infection including flu-like symptoms, fever, redness, warmth, pain.      Pt advised to remove compression bandaging per precautions above, otherwise, to come to next treatment with compression bandaging in place to allow therapist to assess response to bandaging.  Pt in agreement with plan.    Lymphedema specific exercises reviewed with pt including finger/wrist/elbow flex/ext, wrist circumduction, diaphragmatic breathing x 10 reps, adding shoulder flex to 90, to continue 2x/day.    Kinesiotaping: deferred   Volume measurement: Therapeutic Activity Repeat limb volumes performed with mild reduction, however, discrepancy remaining over 20% at this time.  See scanned graph.     TOTAL TREATMENT 75 mins     ASSESSMENT:   Treatment effectiveness and tolerance: Slow progress with limb volume reduction.  MLD tolerated well, with compression bandaging continuing to be method of compression required to reduce L UE, with stage II lymphedema noted, mild fibrotic changes.Additional time spend on truncal sequence MLD. 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  Pt has h/o orthopedic concerns regarding bilat shoulder dysfunction,  arthritis, also further impeding ROM which would aide in resolving AWS.  Pt lives in [place], which treatments at this clinic sporadic, limiting progress with therapy, however, pt has made a commitment to come 2x/week to address progression of lymphedema.   Progress toward goals: Continuation of compression bandaging today.  MLD tolerated well with good documented response to treatment. Continued conversation regarding need to add night time compression to home management phase of routine upon transition to compression garments. See updated goals.     PLAN OF CARE:   Changes to the plan of care: Assess response to compression bandaging, MLD, skin care, exericise.  Continue phase I CDT.  Reassess limb volumes and measure for garments as indicated.   Frequency: 1-2x/week   Other: Pt lives long distance from clinic, which  is impeding consistency of treatment at this time.       Kalman Shan, PT, CLT

## 2014-03-26 MED ORDER — GADOBUTROL 7.5 MMOL/7.5 ML (1 MMOL/ML) IV
7.5 mmol/ mL (1 mmol/mL) | Freq: Once | INTRAVENOUS | Status: AC
Start: 2014-03-26 — End: 2014-03-26
  Administered 2014-03-26: 17:00:00 via INTRAVENOUS

## 2014-03-26 MED FILL — GADAVIST 7.5 MMOL/7.5 ML (1 MMOL/ML) INTRAVENOUS SOLUTION: 7.5 mmol/ mL (1 mmol/mL) | INTRAVENOUS | Qty: 10

## 2014-03-26 NOTE — Progress Notes (Signed)
[name]  [facility]hed[facility] and [facility]  [address] [place]  [address]      LYmphedema Therapy  Visit: 11            Daily note                      30 day/10th visit progress note    NAME: Emily [name]  DATE: 03/26/2014    GOALS  Short term goals  Time frame: 2-4 weeks  1. Instruct the patient to be independent with proper skin care to prevent future skin breakdown and decrease the potential risk for infections that are associated with Lymphedema.  01/24/2014 GOAL MET  2. Patient will be independent with a personal lymphedema exercise program to assist with the lymphatic flow and reduce L UE limb volume by 50-75%, under 3-5% limb volume discrepancy noted.  01/24/2014 ongoing  03/07/2014  Increase in limb volume noted today.  Continue goal.  03/21/2014  Limb volume discrepancy remains over 20% at this time.  3. Patient will understand the signs and symptoms of acute infection.   Long term goals  Time frame: 4-8 weeks  1. Patient will have knowledge of the compression options and acquire a safe and  appropriate daytime and night time compression system to prevent  re-accumulation of fluid. 01/24/2014 fit new compression sleeve--see note below.  03/07/2014 initiated compression bandaging.  03/21/2014 continuing compression bandaging.  03/26/2014 Completed measurements for custom flat knit compression sleeve/glove/night time compression system.  2. Patient or family will be able to don/doff garments independently. Garment  system effectiveness will be evaluated prior to discharge for better long-term  management and outcomes. 03/21/2014 ongoing education regarding compression garments options when indicated per progress with treatment.  3. Improvement in UEFS by 4-8 points allowing pt to perform at shoulder level activities with 75% less pain.   4. L shoulder ROM flex/abd improved by 10-20 degrees with 75% less pain at  end range.  03/21/2014  Improvement in shoulder ROM noted.  Pain persists.  5. Pt independent in self MLD, implant mobilization.  6. To transition patient to independent restorative phase of CDT.       SUBJECTIVE REPORT:  Pt returns to clinic today with compression bandaging in place, stating she tolerated bandaging well with improvement in itching with addition of zinc preparation with skin care. Pt arrived late due to traffic, reporting infection L reconstructed nipple, stating she is scheduled to see [name] today. Pt c/o elbow pain, question joint pain due to h/o arthritis.   Pain:  5-8/10, increased at the end of the day and at end range of available shoulder flex/abd.  Reports taking pain meds at night before bed.           Gait:  indep  ADLs:  indep  Treatment Response:   Note continued elevated limb volume with repeat measurements as noted below.  Phase I CDT including compression bandaging continued today.   Reviewed packet received at evaluation.  Function: see eval.    TREATMENT AND OBJECTIVE DATA SUMMARY:       Manual therapy:  8:33-9:30 am  Therapeutic activity: 8:20-8:32 am 57 minutes  12 minutes     Area to  decongest: L UE   Manual therapy: Note visible fullness distal forearm today, however, improved.  Upper arm remains improved in appearance with primary area of fullness medial aspect just superior to elbow, and proximal forearm distal to elbow.  Hand involvement remains significantly improved excluding index finger, resulting in need to return to finger wraps today. MLD held due to garment measurements completed today and reported infection.  Note skin surrounding nipple red, ill-defined borders.    Pt advised of compression garment options, with decision made, based on condition of extremity and persistent elevation of limb volumes, to completed measurements for custom day/night time compression garments.  Measurements completed for custom flat knit sleeve/glove, CCL 1 for  daytime, and custom Solaris Tribute for night time wear.                       UE skin condition approved, with itching/irritation noted at antecubital fossa prior treatment remaining resolved with addition of zinc skin barrier.  Eucerin lotion applied L UE, with barrier zinc prep applied to antecubital fossa and extremity with pt reporting immediate improvement in itching.             Finger wraps  Stockinette  Cast padding  Komprex II posterior proximal UE  Velfoam piece antecubital fossa  Comprilan 6, 8, 10 cm   Pt instructed to Remove compression if the following occur:  1.  Numbness/tingling increased from what you experience without compression.  2.  Compromise in blood flow, with poor capillary refill, or color changes to toes/fingers.  3.  Onset of pain different from what you may already experience.  4.  Unexplained shortness of breath.  5.  Signs/symptoms of infection including flu-like symptoms, fever, redness, warmth, pain.      Pt advised to remove compression bandaging per precautions above, otherwise, to come to next treatment with compression bandaging in place to allow therapist to assess response to bandaging.  Pt in agreement with plan.    Lymphedema specific exercises reviewed with pt including finger/wrist/elbow flex/ext, wrist circumduction, diaphragmatic breathing x 10 reps, adding shoulder flex to 90, rows with stick x 10 to continue 2x/day.    Kinesiotaping: deferred   Volume measurement: Therapeutic Activity Repeat limb volumes performed with further mild reduction, however, discrepancy remaining over 17% at this time.  See scanned graph.     TOTAL TREATMENT 70 mins     ASSESSMENT:   Treatment effectiveness and tolerance: Slow progress continues with limb volume reduction.  MLD tolerated well, with compression bandaging continuing to be method of compression required to reduce L UE, with stage II lymphedema noted, mild fibrotic changes. Held MLD today with  measurements completed for compression garments.  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  Pt c/o L UE pain, which is not typical with lymphedema.  Pt has h/o arthritis, with UE pain improvement anticipated with transition to compression garment if arthritic in nature. Pt has h/o orthopedic concerns regarding bilat shoulder dysfunction, arthritis, also further impeding ROM which would aide in resolving AWS.  Pt lives in [place], which treatments at this clinic sporadic, limiting progress with therapy, however, pt has made a commitment to come 2x/week to address progression of lymphedema.   Progress toward goals: Continuation of compression bandaging today.  Measurements completed for custom flat knit daytime garments, Solaris Tribute night time garment.See updated goals.     PLAN OF CARE:   Changes to the plan of care: Assess response to compression  bandaging,.  Resume MLD.  Continue skin care, exericise.  Continue phase I CDT.  Fit compression garments upon receiving.   Frequency: 1-2x/week   Other: Pt lives long distance from clinic, which is impeding consistency of treatment at this time.       Kalman Shan, PT, CLT

## 2014-03-26 NOTE — Telephone Encounter (Signed)
-----   Message from Joyce Gross, MD sent at 03/26/2014  2:09 PM EDT -----  Let her know this is normal thanks

## 2014-03-26 NOTE — Telephone Encounter (Signed)
Called patient to advise of results of MRI of brain below per [name], no answer, left message to call back.

## 2014-03-26 NOTE — Telephone Encounter (Signed)
Spoke with patient and advised.

## 2014-03-26 NOTE — Progress Notes (Signed)
Quick Note:        Let her know this is normal thanks    ______

## 2014-03-28 NOTE — Telephone Encounter (Signed)
Patient calling inquiring on  myriad my risk testing results". Advised patient per Renaee Munda advised her results are not back yet.

## 2014-03-31 NOTE — Progress Notes (Signed)
[name]  [facility] and [facility]  [address] [place]  [address]      LYmphedema Therapy  Visit: 12            Daily note                      30 day/10th visit progress note    NAME: Emily [name]  DATE: 03/31/2014    GOALS  Short term goals  Time frame: 2-4 weeks  1. Instruct the patient to be independent with proper skin care to prevent future skin breakdown and decrease the potential risk for infections that are associated with Lymphedema.  01/24/2014 GOAL MET  2. Patient will be independent with a personal lymphedema exercise program to assist with the lymphatic flow and reduce L UE limb volume by 50-75%, under 3-5% limb volume discrepancy noted.  01/24/2014 ongoing  03/07/2014  Increase in limb volume noted today.  Continue goal.  03/21/2014  Limb volume discrepancy remains over 20% at this time.  3. Patient will understand the signs and symptoms of acute infection.   Long term goals  Time frame: 4-8 weeks  1. Patient will have knowledge of the compression options and acquire a safe and  appropriate daytime and night time compression system to prevent  re-accumulation of fluid. 01/24/2014 fit new compression sleeve--see note below.  03/07/2014 initiated compression bandaging.  03/21/2014 continuing compression bandaging.  03/26/2014 Completed measurements for custom flat knit compression sleeve/glove/night time compression system.  2. Patient or family will be able to don/doff garments independently. Garment  system effectiveness will be evaluated prior to discharge for better long-term  management and outcomes. 03/21/2014 ongoing education regarding compression garments options when indicated per progress with treatment.  3. Improvement in UEFS by 4-8 points allowing pt to perform at shoulder level activities with 75% less pain.   4. L shoulder ROM flex/abd improved by 10-20 degrees with 75% less pain at  end range.  03/21/2014  Improvement in shoulder ROM noted.  Pain persists.  5. Pt independent in self MLD, implant mobilization.  6. To transition patient to independent restorative phase of CDT.       SUBJECTIVE REPORT:  Pt returns to clinic today with compression bandaging in place, stating she tolerated bandaging well with improvement in itching with addition of zinc preparation with skin care.    Pain:  5-8/10, increased at the end of the day and at end range of available shoulder flex/abd.  Reports taking pain meds at night before bed.           Gait:  indep  ADLs:  indep  Treatment Response:   Note continued elevated limb volume with repeat measurements as noted below.  Phase I CDT including compression bandaging continued today.   Reviewed packet received at evaluation.  Function: see eval.    TREATMENT AND OBJECTIVE DATA SUMMARY:       Manual therapy:  8:14-9:15 am   61 minutes     Area to decongest: L UE   Manual therapy: Note visible fullness distal upper arm today, however, improved.  Hand involvement remains significantly improved excluding index finger, resulting in need to return to finger wraps today.  MLD resumed today.  Stim R axillary/L inguinal lnn.  Activated AAA/PAA/L AI anasastomoses.  Full UE sequence.  Additional time spend proximal UE, including medial to lateral techniques.                       UE skin condition approved, with itching/irritation noted at antecubital fossa prior treatment remaining resolved with addition of zinc skin barrier.  Eucerin lotion applied L UE, with barrier zinc prep applied to antecubital fossa and extremity with pt reporting immediate improvement in itching.             Finger wraps  Stockinette  Cast padding  Komprex II proximal UE, medial distal aspect UE  Velfoam piece antecubital fossa  Comprilan 6, 8, 10 cm   Pt instructed to Remove compression if the following occur:  1.  Numbness/tingling increased from what you experience without compression.   2.  Compromise in blood flow, with poor capillary refill, or color changes to toes/fingers.  3.  Onset of pain different from what you may already experience.  4.  Unexplained shortness of breath.  5.  Signs/symptoms of infection including flu-like symptoms, fever, redness, warmth, pain.      Pt advised to remove compression bandaging per precautions above, otherwise, to come to next treatment with compression bandaging in place to allow therapist to assess response to bandaging.  Pt in agreement with plan.    Lymphedema specific exercises reviewed with pt including finger/wrist/elbow flex/ext, wrist circumduction, diaphragmatic breathing x 10 reps, adding shoulder flex to 90, rows with stick x 10 to continue 2x/day.    Kinesiotaping: deferred   Volume measurement: Therapeutic Activity Repeat limb volumes performed with further mild reduction, however, discrepancy remaining over 16.64%  at this time.  See scanned graph.     TOTAL TREATMENT 61 mins     ASSESSMENT:   Treatment effectiveness and tolerance: Slow progress continues with limb volume reduction.  MLD tolerated well, with compression bandaging continuing to be method of compression required to reduce L UE, with stage II lymphedema noted, mild fibrotic changes. Held MLD today with measurements completed for compression garments.  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  Pt c/o L UE pain, which is not typical with lymphedema.  Pt has h/o arthritis, with UE pain improvement anticipated with transition to compression garment if arthritic in nature. Pt has h/o orthopedic concerns regarding bilat shoulder dysfunction, arthritis, also further impeding ROM which would aide in resolving AWS.  Pt lives in [place], which treatments at this clinic sporadic, limiting progress with therapy, however, pt has made a commitment to come 2x/week to address progression of lymphedema.   Progress toward goals: Continuation of compression bandaging today.   Measurements completed for custom flat knit daytime garments, Solaris Tribute night time garment prior visit.     PLAN OF CARE:   Changes to the plan of care: Assess response to compression bandaging,.  Continue MLD.  Continue skin care, exericise.  Continue phase I CDT.  Fit compression garments upon receiving.   Frequency: 1-2x/week   Other: Pt lives long distance from clinic, which is impeding consistency of treatment at this time.       Kalman Shan, PT, CLT

## 2014-04-02 NOTE — Addendum Note (Signed)
Addended by: Daiva Huge on: 04/02/2014 11:46 AM      Modules accepted: Level of Service

## 2014-04-04 NOTE — Progress Notes (Signed)
[name]  Lymphedema Clinic and Cancer Rehabilitation  [address] [place]  [address]      LYmphedema Therapy  Visit: 13            Daily note                      30 day/10th visit progress note    NAME: Emily [name]  DATE: 04/04/2014    GOALS  Short term goals  Time frame: 2-4 weeks  1. Instruct the patient to be independent with proper skin care to prevent future skin breakdown and decrease the potential risk for infections that are associated with Lymphedema.  01/24/2014 GOAL MET  2. Patient will be independent with a personal lymphedema exercise program to assist with the lymphatic flow and reduce L UE limb volume by 50-75%, under 3-5% limb volume discrepancy noted.  01/24/2014 ongoing  03/07/2014  Increase in limb volume noted today.  Continue goal.  03/21/2014  Limb volume discrepancy remains over 20% at this time.  3. Patient will understand the signs and symptoms of acute infection.   Long term goals  Time frame: 4-8 weeks  1. Patient will have knowledge of the compression options and acquire a safe and  appropriate daytime and night time compression system to prevent  re-accumulation of fluid. 01/24/2014 fit new compression sleeve--see note below.  03/07/2014 initiated compression bandaging.  03/21/2014 continuing compression bandaging.  03/26/2014 Completed measurements for custom flat knit compression sleeve/glove/night time compression system.  2. Patient or family will be able to don/doff garments independently. Garment  system effectiveness will be evaluated prior to discharge for better long-term  management and outcomes. 03/21/2014 ongoing education regarding compression garments options when indicated per progress with treatment.  3. Improvement in UEFS by 4-8 points allowing pt to perform at shoulder level activities with 75% less pain.   4. L shoulder ROM flex/abd improved by 10-20 degrees with 75% less pain at  end range.  03/21/2014  Improvement in shoulder ROM noted.  Pain persists.  5. Pt independent in self MLD, implant mobilization.  6. To transition patient to independent restorative phase of CDT.       SUBJECTIVE REPORT:  Pt returns to clinic today with compression bandaging in place, stating she tolerated bandaging well with improvement in itching with addition of zinc preparation with skin care.    Pain:  5-8/10, increased at the end of the day and at end range of available shoulder flex/abd.  Reports taking pain meds at night before bed.           Gait:  indep  ADLs:  indep  Treatment Response:   Note continued elevated limb volume with repeat measurements as noted below.  Phase I CDT including compression bandaging continued today.   Reviewed packet received at evaluation.  Function: see eval.    TREATMENT AND OBJECTIVE DATA SUMMARY:       Manual therapy:  11:16 am-12:20 pm   64 minutes     Area to decongest: L UE   Manual therapy: Note visible fullness forearm today, however, improved.  Hand involvement remains significantly improved excluding index finger, resulting in need to return to finger wraps today.  MLD sequence.  Stim R axillary/L inguinal lnn.  Activated AAA/PAA/L AI anasastomoses.  Full UE sequence.  Additional time spend proximal UE, including medial to lateral techniques.                       UE skin condition approved, with itching/irritation noted at antecubital fossa prior treatment remaining resolved with addition of zinc skin barrier.  Eucerin lotion applied L UE, with barrier zinc prep applied to antecubital fossa and extremity with pt reporting immediate improvement in itching.             Finger wraps  Stockinette  Cast padding  Komprex II  forearm  Velfoam piece antecubital fossa  Comprilan 6, 8, 10 cm   Pt instructed to Remove compression if the following occur:  1.  Numbness/tingling increased from what you experience without compression.   2.  Compromise in blood flow, with poor capillary refill, or color changes to toes/fingers.  3.  Onset of pain different from what you may already experience.  4.  Unexplained shortness of breath.  5.  Signs/symptoms of infection including flu-like symptoms, fever, redness, warmth, pain.      Pt advised to remove compression bandaging per precautions above, otherwise, to come to next treatment with compression bandaging in place to allow therapist to assess response to bandaging.  Pt in agreement with plan.    Lymphedema specific exercises reviewed with pt including finger/wrist/elbow flex/ext, wrist circumduction, diaphragmatic breathing x 10 reps, adding shoulder flex to 90, rows with stick x 10 to continue 2x/day.    Kinesiotaping: deferred   Volume measurement: Therapeutic Activity deferred     TOTAL TREATMENT 64 mins     ASSESSMENT:   Treatment effectiveness and tolerance: Slow progress continues with limb volume reduction.  MLD tolerated well, with compression bandaging continuing to be method of compression required to reduce L UE, with stage II lymphedema noted, mild fibrotic changes.   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  Pt c/o L UE pain, wrist/elbow today, which is not typical with lymphedema.  Pt has h/o arthritis, with UE pain improvement anticipated with transition to compression garment if arthritic in nature. Pt has h/o orthopedic concerns regarding bilat shoulder dysfunction, arthritis, also further impeding ROM which would aide in resolving AWS.  Pt lives in [place], which treatments at this clinic sporadic, limiting progress with therapy, however, pt has made a commitment to come 2x/week to address progression of lymphedema.   Progress toward goals: Continuation of compression bandaging today.  Measurements completed for custom flat knit daytime garments, Solaris Tribute night time garment prior visit.     PLAN OF CARE:    Changes to the plan of care: Assess response to compression bandaging,.  Continue MLD.  Continue skin care, exericise.  Continue phase I CDT.  Fit compression garments upon receiving.   Frequency: 1-2x/week   Other: Pt lives long distance from clinic, which is impeding consistency of treatment at this time.       Kalman Shan, PT, CLT

## 2014-04-07 NOTE — Telephone Encounter (Signed)
Called and spoke to [name] at [name] who states that patient had already had a 25-genetic panel done previously so the test we ordered would actually be an "update My Risk" test, which is currently only being covered by [facility] unless patient has triple negative breast cancer, bilateral breast cancer, or family history of breast cancer.  [name] states that test will cost patient $1800 and that if patient has any further questions she could call [name].  [name] states test will be cancelled on 04/09/14 unless she hears otherwise, but that sample is good for 60 days.    Called and informed patient of above note.  Patient states that she talked to [name] supervisor and was told if physician did a peer to peer with [employer] that it should be covered due to her family history of breast cancer.  Patient insisting that [name] call [employer] and do peer to peer.  Advised patient that I would let [name] know.

## 2014-04-07 NOTE — Telephone Encounter (Signed)
Pt called to notify [name] that [employer] denied to cover her blood work.  In order to get coverage [name] would have to do a Peer to Peer with [name] at [facility] #[phone]

## 2014-04-08 LAB — POC CHEM8
Anion gap (POC): 17 mmol/L — ABNORMAL HIGH (ref 5–15)
BUN (POC): 12 MG/DL (ref 9–20)
CO2 (POC): 26 MMOL/L (ref 21–32)
Calcium, ionized (POC): 1.14 MMOL/L (ref 1.12–1.32)
Chloride (POC): 99 MMOL/L (ref 98–107)
Creatinine (POC): 0.6 MG/DL (ref 0.6–1.3)
GFRAA, POC: >60 mL/min/{1.73_m2} (ref >60)
GFRNA, POC: >60 mL/min/{1.73_m2} (ref >60)
Glucose (POC): 135 MG/DL — ABNORMAL HIGH (ref 65–105)
Hematocrit (POC): 40 % (ref 35.0–47.0)
Hemoglobin (POC): 13.6 GM/DL (ref 11.5–16.0)
Potassium (POC): 3.6 MMOL/L (ref 3.5–5.1)
Sodium (POC): 137 MMOL/L (ref 136–145)

## 2014-04-08 LAB — PHOSPHORUS: Phosphorus: 3.7 MG/DL (ref 2.6–4.7)

## 2014-04-08 LAB — MAGNESIUM: Magnesium: 2 mg/dL (ref 1.6–2.4)

## 2014-04-08 MED ADMIN — Denosumab (PROLIA) injection 60 mg: SUBCUTANEOUS | @ 19:00:00 | NDC 55513071001

## 2014-04-08 MED FILL — PROLIA 60 MG/ML SUBCUTANEOUS SYRINGE: 60 mg/mL | SUBCUTANEOUS | Qty: 1

## 2014-04-08 NOTE — Telephone Encounter (Signed)
Called [employer] this morning to initiate peer to peer to obtain approval for My Risk genetic testing.  I was told by [employer]'s Medical Management [phone] that our office could not initiate peer to peer because there was a different physician, not from our office, listed as ordering the genetic testing and that would be the physician who needed to initiate peer to peer.  Inquired which physician was listed as ordering physician, and was told by representative that due to HIPAA she could not inform me of physician's name.    Called and advised patient of above note and that we could not initiate peer to peer.  Patient requests that I contact Aviva Signs, genetic counselor with [facility], at [phone] and inform her of above note and ask her to initiate peer to peer with [facility].  Advised patient that I would inform Nira Conn of need to initiate peer to peer.  Patient voices understanding and denies any further questions at this time.  Called and spoke to Javier Docker, who is covering for [employer] today, at [phone] and informed him of above note and need for them to initiate peer to peer to get testing approved.  John states that Nira Conn would be the one who initiates peer to peer and he would inform her first thing in the morning.  Advised John that according to Girard at [facility] test would be cancelled after 04/09/14 if not approved, but sample would be good for 60 days.  John voices understanding.  Lorenda Cahill at [facility] and left voicemail informing her that Nira Conn would be initiating peer to peer tomorrow with [place].

## 2014-04-08 NOTE — Progress Notes (Signed)
[name]  Lymphedema Clinic and Cancer Rehabilitation  [address]  [place]  [address]      LYmphedema Therapy  Visit: 14            Daily note                      30 day/10th visit progress note    NAME: Emily [name]  DATE: 04/09/2014    GOALS  Short term goals  Time frame: 2-4 weeks  1. Instruct the patient to be independent with proper skin care to prevent future skin breakdown and decrease the potential risk for infections that are associated with Lymphedema.  01/24/2014 GOAL MET  2. Patient will be independent with a personal lymphedema exercise program to assist with the lymphatic flow and reduce L UE limb volume by 50-75%, under 3-5% limb volume discrepancy noted.  01/24/2014 ongoing  03/07/2014  Increase in limb volume noted today.  Continue goal.  03/21/2014  Limb volume discrepancy remains over 20% at this time.  3. Patient will understand the signs and symptoms of acute infection.   Long term goals  Time frame: 4-8 weeks  1. Patient will have knowledge of the compression options and acquire a safe and  appropriate daytime and night time compression system to prevent  re-accumulation of fluid. 01/24/2014 fit new compression sleeve--see note below.  03/07/2014 initiated compression bandaging.  03/21/2014 continuing compression bandaging.  03/26/2014 Completed measurements for custom flat knit compression sleeve/glove/night time compression system.  2. Patient or family will be able to don/doff garments independently. Garment  system effectiveness will be evaluated prior to discharge for better long-term  management and outcomes. 03/21/2014 ongoing education regarding compression garments options when indicated per progress with treatment.  3. Improvement in UEFS by 4-8 points allowing pt to perform at shoulder level activities with 75% less pain.   4. L shoulder ROM flex/abd improved by 10-20 degrees with 75% less pain at  end range.  03/21/2014  Improvement in shoulder ROM noted.  Pain persists.  5. Pt independent in self MLD, implant mobilization.  6. To transition patient to independent restorative phase of CDT.       SUBJECTIVE REPORT:  Pt returns to clinic today with compression bandaging in place, stating she tolerated bandaging well with improvement in itching with addition of zinc preparation with skin care. Pt continues with c/o pain, in areas of fullness, primarily volar forearm today, as well as joint discomfort.  Awaiting compression garments to arrive for fit.     Pain:  5-8/10, increased at the end of the day and at end range of available shoulder flex/abd.  Reports taking pain meds at night before bed.           Gait:  indep  ADLs:  indep  Treatment Response:   Note continued elevated limb volume with repeat measurements as noted below.  Phase I CDT including compression bandaging continued today.   Reviewed packet received at evaluation.  Function: see eval.    TREATMENT AND OBJECTIVE DATA SUMMARY:       Manual therapy:  12:48- pm   64 minutes     Area to decongest: L UE   Manual therapy: Note visible  fullness forearm today, however, overall improved limb volumes.  Hand involvement remains significantly improved excluding index finger, resulting in need to return to finger wraps today.    L LE/Upper quadrant MLD sequence.  Stim R axillary/L inguinal lnn.  Activated AAA/PAA/L AI anastomoses, perforating precollectors  Full UE sequence.  Additional time spend proximal UE, including medial to lateral techniques.                       UE skin condition approved, with itching/irritation noted at antecubital fossa prior treatment remaining resolved with addition of zinc skin barrier.  Eucerin lotion applied L UE, with barrier zinc prep applied to antecubital fossa and extremity with pt reporting immediate improvement in itching.             Finger wraps held today  Stockinette  Cast padding  Komprex II  forearm   Velfoam piece antecubital fossa  Comprilan 6, 8, 10 cm   Pt instructed to Remove compression if the following occur:  1.  Numbness/tingling increased from what you experience without compression.  2.  Compromise in blood flow, with poor capillary refill, or color changes to toes/fingers.  3.  Onset of pain different from what you may already experience.  4.  Unexplained shortness of breath.  5.  Signs/symptoms of infection including flu-like symptoms, fever, redness, warmth, pain.      Pt advised to remove compression bandaging per precautions above, otherwise, to come to next treatment with compression bandaging in place to allow therapist to assess response to bandaging.  Pt in agreement with plan.    Lymphedema specific exercises reviewed with pt including finger/wrist/elbow flex/ext, wrist circumduction, diaphragmatic breathing x 10 reps, adding shoulder flex to 90, rows with stick x 10 to continue 2x/day.    Kinesiotaping: deferred   Volume measurement: Therapeutic Activity Limb volume measurements completed today, with overall reduction noted L UE by 133.84 mL, with limb volume discrepancy at 14.57% currently, improved from over 24% difference at point of exacerbation.       TOTAL TREATMENT 57 mins     ASSESSMENT:   Treatment effectiveness and tolerance: Slow progress continues with limb volume reduction, however, continue to note incremental reductions.  MLD tolerated well, with compression bandaging continuing to be method of compression required to reduce L UE, with stage II lymphedema noted, mild fibrotic changes.   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  Pt c/o L UE pain, wrist/elbow today, which is not typical with lymphedema.  Pt has h/o arthritis, with UE pain improvement anticipated with transition to compression garment if arthritic in nature. Pt has h/o orthopedic concerns regarding bilat shoulder dysfunction, arthritis, also  further impeding ROM which would aide in resolving AWS.  Pt lives in [place], which treatments at this clinic sporadic, limiting progress with therapy, however, pt has made a commitment to come 2x/week to address progression of lymphedema.   Progress toward goals: Continuation of compression bandaging today.  Measurements completed for custom flat knit daytime garments, Solaris Tribute night time garment prior visit.     PLAN OF CARE:   Changes to the plan of care: Assess response to compression bandaging,.  Continue MLD.  Continue skin care, exericise.  Continue phase I CDT.  Fit compression garments upon receiving.   Frequency: 1-2x/week   Other: Pt lives long distance from clinic, which is impeding consistency of treatment at this time.       Kalman Shan, PT, CLT

## 2014-04-08 NOTE — Progress Notes (Signed)
Outpatient Infusion Center Progress Note    1400 Pt admit to [facility] for Prolia injection. Pt ambulatory in stable condition. Assessment completed. No new concerns voiced.    Patient Vitals for the past 8 hrs:   Temp Pulse BP SpO2   04/08/14 1535 97.6 ??F (36.4 ??C) 68 106/69 mmHg -   04/08/14 1417 97.4 ??F (36.3 ??C) 67 133/72 mmHg 100 %       Medications:  Prolia    1535 Pt tolerated treatment well and given d/c instructions and information on medication. Pt verbalized understanding. Pt d/c to home ambulatory in no distress. Pt aware of next appointment scheduled for 10/07/14.    Recent Results (from the past 8 hour(s))   POC CHEM8    Collection Time: 04/08/14  2:43 PM   Result Value Ref Range    Calcium, ionized (POC) 1.14 1.12 - 1.32 MMOL/L    Sodium (POC) 137 136 - 145 MMOL/L    Potassium (POC) 3.6 3.5 - 5.1 MMOL/L    Chloride (POC) 99 98 - 107 MMOL/L    CO2 (POC) 26 21 - 32 MMOL/L    Anion gap (POC) 17 (H) 5 - 15 mmol/L    Glucose (POC) 135 (H) 65 - 105 MG/DL    BUN (POC) 12 9 - 20 MG/DL    Creatinine (POC) 0.6 0.6 - 1.3 MG/DL    GFR-AA (POC) >60 >60 ml/min/1.27m2    GFR, non-AA (POC) >60 >60 ml/min/1.97m2    Hemoglobin (POC) 13.6 11.5 - 16.0 GM/DL    Hematocrit (POC) 40 35.0 - 47.0 %    Comment Comment Not Indicated.

## 2014-04-09 NOTE — Telephone Encounter (Addendum)
Received call from [facility], genetic counselor, who states that she is not going to initiate a peer to peer because what patient really needs is a PGL panel, not the My risk panel.  Emily [name] states that she had discussed all of this previously with patient and had informed patient that there was a company called [employer] that was supposed to start doing the PGL testing at an affordable rate in June or July of this year and she had discussed with patient that her best option would be to wait until this company started performing this test.  Emily [name] states that she will call and discuss all of this again with patient today and inform her that a peer to peer was not going to be done.

## 2014-04-11 NOTE — Progress Notes (Signed)
[name]  Lymphedema Clinic and Cancer Rehabilitation  [address]  [place]  [address]      LYmphedema Therapy  Visit: 15            Daily note                      30 day/10th visit progress note    NAME: Emily EWELL  DATE: 04/11/2014    GOALS  Short term goals  Time frame: 2-4 weeks  1. Instruct the patient to be independent with proper skin care to prevent future skin breakdown and decrease the potential risk for infections that are associated with Lymphedema. 01/24/2014 GOAL MET  2. Patient will be independent with a personal lymphedema exercise program to assist with the lymphatic flow and reduce L UE limb volume by 50-75%, under 3-5% limb volume discrepancy noted. 01/24/2014 ongoing 03/07/2014 Increase in limb volume noted today. Continue goal. 03/21/2014 Limb volume discrepancy remains over 20% at this time.  3. Patient will understand the signs and symptoms of acute infection.   Long term goals  Time frame: 4-8 weeks  1. Patient will have knowledge of the compression options and acquire a safe and appropriate daytime and night time compression system to prevent re-accumulation of fluid. 01/24/2014 fit new compression sleeve--see note below. 03/07/2014 initiated compression bandaging. 03/21/2014 continuing compression bandaging. 03/26/2014 Completed measurements for custom flat knit compression sleeve/glove/night time compression system.  2. Patient or family will be able to don/doff garments independently. Garment system effectiveness will be evaluated prior to discharge for better long-term management and outcomes. 03/21/2014 ongoing education regarding compression garments options when indicated per progress with treatment.  3. Improvement in UEFS by 4-8 points allowing pt to perform at shoulder level activities with 75% less pain.   4. L shoulder ROM flex/abd improved by 10-20 degrees with 75% less pain at  end range. 03/21/2014 Improvement in shoulder ROM noted. Pain persists.  5. Pt independent in self MLD, implant mobilization.  6. To transition patient to independent restorative phase of CDT.      SUBJECTIVE REPORT:   Patient arrives today stating her arm is tender and painful.  Rates a 7/10 at anterior and posterior forearm and distal UE (elbow),  Following MLD improved to 3-4/10.  She notes increased pain at region of comprex II foam pieces.   Pain: 5-8/10, increased at the end of the day and at end range of available shoulder flex/abd. Reports taking pain meds at night before bed.           Gait: indep  ADLs: indep  Treatment Response: Note continued elevated limb volume with repeat measurements as noted below. Phase I CDT including compression bandaging continued today.   Reviewed packet received at evaluation.  Function: see eval.  TREATMENT AND OBJECTIVE DATA SUMMARY:      Manual therapy: 9:45-11:15 90 minutes    Area to decongest:  L UE    Manual therapy:  Note visible fullness with hard fibrotic density at anterior and posterior forearm , however, overall improved limb volumes. Noted of distal biceps region with increased fullness today.Hand involvement remains significantly improved .    L LE/Upper quadrant MLD sequence. Stim R axillary/L inguinal lnn.  Activated AAA/PAA/L AI anastomoses, perforating precollectors Full UE sequence. Additional time spend proximal UE and axilla, including medial to lateral techniques.                      UE skin condition approved, with itching/irritation noted at antecubital fossa prior treatment remaining resolved with addition of zinc skin barrier. Eucerin lotion applied L UE, with barrier zinc prep applied to antecubital fossa and extremity with pt reporting immediate improvement in itching.              Finger wraps added back today due to some fullness noted in pinky and continued in index finger today  Stockinette  Cast padding   Komprex II forearm- omitted today due to report of pain and tenderness at previous application location  Velfoam piece antecubital fossa  Comprilan 6, 8, 10 cm   Pt instructed to Remove compression if the following occur:  1. Numbness/tingling increased from what you experience without compression.  2. Compromise in blood flow, with poor capillary refill, or color changes to toes/fingers.  3. Onset of pain different from what you may already experience.  4. Unexplained shortness of breath.  5. Signs/symptoms of infection including flu-like symptoms, fever, redness, warmth, pain.     Pt advised to remove compression bandaging per precautions above, otherwise, to come to next treatment with compression bandaging in place to allow therapist to assess response to bandaging. Pt in agreement with plan.    Lymphedema specific exercises reviewed with pt including finger/wrist/elbow flex/ext, wrist circumduction, diaphragmatic breathing x 10 reps, adding shoulder flex to 90, rows with stick x 10 to continue 2x/day.     Kinesiotaping:  deferred    Volume measurement:  Affected Side: Left UE   Left (cm) Right (cm)   Base 3rd finger 5.8           Palm 18.5           Wrist 15           Mid Forearm 22           Elbow 26           Axilla 28           Length                  TOTAL TREATMENT  90 mins    ASSESSMENT:    Treatment effectiveness and tolerance:  Patient is continuing to have noted reduction in volumes today.  She is experiencing increased discomfort with addition of Comprex II, this was deferred in the application process today with MLB to assess tolerance at next fitting.  She continues to present with increased fibrotic regions at the anterior and posterior forearm and today in the distal UE- extra time spent here with MLD and also in the axilla region due to her continued presentation of axillary web. She notes improved pain levels following MLD.   Progress toward goals:  Continuation of compression bandaging today.  Measurements completed for custom flat knit daytime garments, Solaris Tribute night time garment prior visit.    PLAN OF CARE:    Changes to the plan of care:  Assess response to compression bandaging,. Continue MLD. Continue skin care, exericise. Continue phase I CDT. Fit compression garments upon receiving.    Frequency:  1-2x/week    Other:  Pt lives long distance from clinic, which is impeding consistency of treatment at this time.  Manus Gunning, PT, DPT  90 minutes

## 2014-04-15 NOTE — Telephone Encounter (Signed)
Patient called and wanted to reschedule her appointment with Dr.Irvin because she is having a unexpected surgery because she had an infection and her stitches split apart. Her surgery is Tuesday with Dr.Chin.

## 2014-04-17 MED ORDER — ESCITALOPRAM 20 MG TAB
20 mg | ORAL_TABLET | ORAL | Status: DC
Start: 2014-04-17 — End: 2014-08-02

## 2014-04-17 NOTE — Progress Notes (Addendum)
[name]  Lymphedema Clinic and Cancer Rehabilitation  [address] [place]  [address]      LYmphedema Therapy  Visit: 16            Daily note                      30 day/10th visit progress note    NAME: LINET BRASH  DATE: 04/17/2014      GOALS  Short term goals  Time frame: 2-4 weeks  1. Instruct the patient to be independent with proper skin care to prevent future skin breakdown and decrease the potential risk for infections that are associated with Lymphedema. 01/24/2014 GOAL MET  2. Patient will be independent with a personal lymphedema exercise program to assist with the lymphatic flow and reduce L UE limb volume by 50-75%, under 3-5% limb volume discrepancy noted. 01/24/2014 ongoing 03/07/2014 Increase in limb volume noted today. Continue goal. 03/21/2014 Limb volume discrepancy remains over 20% at this time.  3. Patient will understand the signs and symptoms of acute infection.   Long term goals  Time frame: 4-8 weeks  1. Patient will have knowledge of the compression options and acquire a safe and appropriate daytime and night time compression system to prevent re-accumulation of fluid. 01/24/2014 fit new compression sleeve--see note below. 03/07/2014 initiated compression bandaging. 03/21/2014 continuing compression bandaging. 03/26/2014 Completed measurements for custom flat knit compression sleeve/glove/night time compression system.  2. Patient or family will be able to don/doff garments independently. Garment system effectiveness will be evaluated prior to discharge for better long-term management and outcomes. 03/21/2014 ongoing education regarding compression garments options when indicated per progress with treatment.  3. Improvement in UEFS by 4-8 points allowing pt to perform at shoulder level activities with 75% less pain.   4. L shoulder ROM flex/abd improved by 10-20 degrees with 75% less pain at  end range. 03/21/2014 Improvement in shoulder ROM noted. Pain persists.  5. Pt independent in self MLD, implant mobilization.  6. To transition patient to independent restorative phase of CDT.    SUBJECTIVE REPORT:  Patient arrives with MLB in place.  She indicates the 1st digit finger wrap came undone, but all others in place.  She states the itching has improved.  She is going for surgery on Tuesday of next week (July 21) with plastic surgery to close the open site on Left nipple.  Bandage in place over left nipple without noticeable drainage.   Pain: 5-8/10, increased at the end of the day and at end range of available shoulder flex/abd. Reports taking pain meds at night before bed.  Gait: indep  ADLs: indep  Treatment Response: Note continued elevated limb volume with repeat measurements as noted below. Phase I CDT including compression bandaging continued today.   Reviewed packet received at evaluation.  Function: see eval.  TREATMENT AND OBJECTIVE DATA SUMMARY:     Manual therapy: 8:15-9:15  60 minutes     Area to decongest:   L UE     Manual therapy:   Noted reduction of fullness at anterior and posterior forearm- still with fibrotic/woody texture , however, overall improved limb volumes. Noted medial antecubital fossa fullness vs midline fullness at last application.  3rd digit finger fullness decreased with application of finger wraps since last visit.    MLD held today to instruct in self MLB in preparation for her surgery and requirement to re-apply bandages after surgery.      UE skin condition approved, with itching/irritation noted at antecubital fossa prior treatment remaining resolved with addition of zinc skin barrier. Eucerin lotion applied L UE, with barrier zinc prep applied to antecubital fossa and extremity with pt reporting immediate improvement in itching.      Finger wraps   Stockinette  Cast padding  Velfoam piece antecubital fossa- larger kidney shaped size piece placed today   Comprilan 6, 8, 10 cm   Pt instructed to Remove compression if the following occur:  1. Numbness/tingling increased from what you experience without compression.  2. Compromise in blood flow, with poor capillary refill, or color changes to toes/fingers.  3. Onset of pain different from what you may already experience.  4. Unexplained shortness of breath.  5. Signs/symptoms of infection including flu-like symptoms, fever, redness, warmth, pain.     Pt advised to remove compression bandaging per precautions above, otherwise, to come to next treatment with compression bandaging in place to allow therapist to assess response to bandaging. Pt in agreement with plan.        Kinesiotaping:   deferred     Volume measurement:   Affected Side: Left UE   Left (cm) Right (cm)   Base 3rd finger 5.7           Palm 18.1           Wrist 15           Mid Forearm 23.5           Elbow 27.7           Axilla 31           Length                  TOTAL TREATMENT   60 mins     ASSESSMENT:     Treatment effectiveness and tolerance:  Patient with slight increase in volumes on the Left elbow and upper arm this visit.  Increased fluid accumulation at the medial forearm today.  A new larger velfoam piece was added to encompass the entire antecubital fossa region.    Patient educated in self bandaging today.  She performed 57% of application process and 25% by therapist with cuing throughout.  She was given a handout for self MLB to use for assistance with management.  She was instructed to re-apply bandages once discharged from the hospital and no signs and symptoms of infection are present.  We will need visits following her surgery to ensure correct donning application following surgery and still awaiting on flat knit garments.   Progress toward goals:  Continuation of compression bandaging today. Measurements completed for custom flat knit daytime garments, Solaris Tribute night time garment prior visit.    PLAN OF CARE:      Changes to the plan of care:  Assess compliance with self-MLB application. Continue skin care, exericise. Continue phase I CDT. Fit compression garments upon receiving.    Frequency:  1-2x/week    Other:  Pt lives long distance from clinic, which is impeding consistency of treatment at this time.            Manus Gunning, PT, DPT  60 minutes

## 2014-04-22 ENCOUNTER — Inpatient Hospital Stay: Payer: BLUE CROSS/BLUE SHIELD

## 2014-04-22 MED ORDER — MORPHINE 10 MG/ML INJ SOLUTION
10 mg/ml | INTRAMUSCULAR | Status: DC | PRN
Start: 2014-04-22 — End: 2014-04-22

## 2014-04-22 MED ORDER — MIDAZOLAM 1 MG/ML IJ SOLN
1 mg/mL | INTRAMUSCULAR | Status: DC | PRN
Start: 2014-04-22 — End: 2014-04-22
  Administered 2014-04-22 (×2): via INTRAVENOUS

## 2014-04-22 MED ORDER — DEXAMETHASONE SODIUM PHOSPHATE 4 MG/ML IJ SOLN
4 mg/mL | Freq: Once | INTRAMUSCULAR | Status: DC | PRN
Start: 2014-04-22 — End: 2014-04-22

## 2014-04-22 MED ORDER — EPHEDRINE SULFATE 50 MG/ML IJ SOLN
50 mg/mL | INTRAMUSCULAR | Status: DC | PRN
Start: 2014-04-22 — End: 2014-04-22

## 2014-04-22 MED ORDER — SODIUM CHLORIDE 0.9 % IJ SYRG
INTRAMUSCULAR | Status: DC | PRN
Start: 2014-04-22 — End: 2014-04-22

## 2014-04-22 MED ORDER — FENTANYL CITRATE (PF) 50 MCG/ML IJ SOLN
50 mcg/mL | INTRAMUSCULAR | Status: AC | PRN
Start: 2014-04-22 — End: 2014-04-22
  Administered 2014-04-22 (×4): via INTRAVENOUS

## 2014-04-22 MED ORDER — OXYCODONE-ACETAMINOPHEN 5 MG-325 MG TAB
5-325 mg | ORAL_TABLET | Freq: Four times a day (QID) | ORAL | Status: DC | PRN
Start: 2014-04-22 — End: 2014-09-18

## 2014-04-22 MED ORDER — ONDANSETRON (PF) 4 MG/2 ML INJECTION
4 mg/2 mL | INTRAMUSCULAR | Status: DC | PRN
Start: 2014-04-22 — End: 2014-04-22
  Administered 2014-04-22: 17:00:00 via INTRAVENOUS

## 2014-04-22 MED ORDER — DIPHENHYDRAMINE HCL 50 MG/ML IJ SOLN
50 mg/mL | INTRAMUSCULAR | Status: DC | PRN
Start: 2014-04-22 — End: 2014-04-22
  Administered 2014-04-22: 17:00:00 via INTRAVENOUS

## 2014-04-22 MED ADMIN — lactated ringers infusion: INTRAVENOUS | @ 17:00:00 | NDC 00409795309

## 2014-04-22 MED ADMIN — oxyCODONE-acetaminophen (PERCOCET) 5-325 mg per tablet 1 Tab: ORAL | @ 18:00:00 | NDC 00406051223

## 2014-04-22 MED ADMIN — dexamethasone (DECADRON) 4 mg/mL injection: INTRAVENOUS | @ 16:00:00 | NDC 63323016501

## 2014-04-22 MED ADMIN — PHENYLephrine (NEOSYNEPHRINE) 10 mg in 0.9% sodium chloride 250 mL infusion: INTRAVENOUS | @ 16:00:00 | NDC 10019016339

## 2014-04-22 MED ADMIN — bupivacaine 0.25% -EPINEPHrine 1:200,000 (SENSORCAINE) 0.25 %-1:200,000 injection: SUBCUTANEOUS | @ 16:00:00 | NDC 00409904202

## 2014-04-22 MED ADMIN — fentaNYL citrate (PF) injection: INTRAVENOUS | @ 16:00:00 | NDC 00409909332

## 2014-04-22 MED ADMIN — propofol (DIPRIVAN) 10 mg/mL injection: INTRAVENOUS | @ 16:00:00 | NDC 63323026920

## 2014-04-22 MED ADMIN — lactated ringers infusion: INTRAVENOUS | @ 16:00:00 | NDC 00409795309

## 2014-04-22 MED ADMIN — clindamycin (CLEOCIN) 600mg D5W 50mL IVPB (premix): INTRAVENOUS | @ 16:00:00 | NDC 00009337502

## 2014-04-22 MED ADMIN — ondansetron (ZOFRAN) injection: INTRAVENOUS | @ 16:00:00 | NDC 23155037831

## 2014-04-22 MED ADMIN — lidocaine (PF) (XYLOCAINE) 20 mg/mL (2 %) injection: INTRAVENOUS | @ 16:00:00 | NDC 63323049507

## 2014-04-22 MED ADMIN — HYDROmorphone (PF) (DILAUDID) injection 0.2 mg: INTRAVENOUS | @ 18:00:00 | NDC 00409255201

## 2014-04-22 MED ADMIN — midazolam (VERSED) injection: INTRAVENOUS | @ 16:00:00 | NDC 25021065502

## 2014-04-22 MED ADMIN — bacitracin 50,000 Units, vancomycin 1 g, gentamicin 160 mg in sodium chloride irrigation 0.9 % 1,000 mL Irrigation: @ 16:00:00 | NDC 63323001002

## 2014-04-22 MED ADMIN — HYDROmorphone (PF) (DILAUDID) 1 mg/mL injection: INTRAVENOUS | @ 18:00:00 | NDC 00409255201

## 2014-04-22 MED ADMIN — lactated ringers infusion: INTRAVENOUS | @ 16:00:00 | NDC 00338011704

## 2014-04-22 MED FILL — DEXTROSE 5%-LACTATED RINGERS IV: INTRAVENOUS | Qty: 1000

## 2014-04-22 MED FILL — MIDAZOLAM 1 MG/ML IJ SOLN: 1 mg/mL | INTRAMUSCULAR | Qty: 2

## 2014-04-22 MED FILL — VANCOMYCIN 1,000 MG IV SOLR: 1000 mg | INTRAVENOUS | Qty: 1000

## 2014-04-22 MED FILL — SENSORCAINE-MPF/EPINEPHRINE 0.25 %-1:200,000 INJECTION SOLUTION: 0.25 %-1:200,000 | INTRAMUSCULAR | Qty: 10

## 2014-04-22 MED FILL — FENTANYL CITRATE (PF) 50 MCG/ML IJ SOLN: 50 mcg/mL | INTRAMUSCULAR | Qty: 2

## 2014-04-22 MED FILL — HYDROMORPHONE (PF) 1 MG/ML IJ SOLN: 1 mg/mL | INTRAMUSCULAR | Qty: 1

## 2014-04-22 MED FILL — LACTATED RINGERS IV: INTRAVENOUS | Qty: 1000

## 2014-04-22 MED FILL — DIPHENHYDRAMINE HCL 50 MG/ML IJ SOLN: 50 mg/mL | INTRAMUSCULAR | Qty: 1

## 2014-04-22 MED FILL — BD POSIFLUSH NORMAL SALINE 0.9 % INJECTION SYRINGE: INTRAMUSCULAR | Qty: 10

## 2014-04-22 MED FILL — ONDANSETRON (PF) 4 MG/2 ML INJECTION: 4 mg/2 mL | INTRAMUSCULAR | Qty: 2

## 2014-04-22 MED FILL — BACITRACIN 50,000 UNIT IM: 50000 unit | INTRAMUSCULAR | Qty: 50000

## 2014-04-22 MED FILL — GENTAMICIN 40 MG/ML IJ SOLN: 40 mg/mL | INTRAMUSCULAR | Qty: 4

## 2014-04-22 MED FILL — CLEOCIN 600 MG/50 ML IN 5 % DEXTROSE INTRAVENOUS PIGGYBACK: 600 mg/50 mL | INTRAVENOUS | Qty: 50

## 2014-04-22 MED FILL — OXYCODONE-ACETAMINOPHEN 5 MG-325 MG TAB: 5-325 mg | ORAL | Qty: 1

## 2014-04-22 MED FILL — BUPIVACAINE-EPINEPHRINE (PF) 0.25 %-1:200,000 IJ SOLN: 0.25 %-1:200,000 | INTRAMUSCULAR | Qty: 30

## 2014-04-22 NOTE — Op Note (Signed)
Name:      Emily [name], Emily [name]                                          Surgeon:        Sharlotte Alamo, MD  Account #: [id number]                 Surgery Date:   04/22/2014  DOB:       1951/06/18  Age:       63                           Location:                                 OPERATIVE REPORT      PREOPERATIVE DIAGNOSIS: Cellulitis with exposed left breast implant.    POSTOPERATIVE DIAGNOSIS: Cellulitis with exposed left breast implant.    PROCEDURES PERFORMED: Removal of left breast implant and washout.    SURGEON: Sharlotte Alamo, MD.    ANESTHESIA: General.    ESTIMATED BLOOD LOSS: Minimal.    DRAINS AND TUBES: None.    COMPLICATIONS: None.    SPECIMENS REMOVED: Mastectomy skin and culture.    INDICATIONS: The patient is a 63 year old white female status post left  total mastectomy for breast cancer. She was radiated. She developed some  cellulitis in her left breast recently and this progressed to  full-thickness necrosis over the site where she had a nipple reconstruction  with exposure of the implant. Prior to procedure, the risks and benefits  were discussed with the patient. Informed consent was obtained.    DESCRIPTION OF PROCEDURE: On the day of surgery, in the preoperative  holding area. The left breast was marked. She was taken to the operating  room and placed supine on the operating table. After induction of general  anesthesia, the left breast was prepped and draped in standard fashion.  Following preoperative time-out, I began the case. I opened up this pocket  resecting mastectomy skin and submitted this as specimen. I then did a culture  deeply to see if there was anything growing. I then removed the implant, which was 500 mL  high-profile Mentor implant, which was intact. I then pulse lavaged the  area out with a liter of triple antibiotic irrigation, consisting of  vancomycin, gentamicin and bacitracin. I then placed a drain and I debrided  away some loose AlloDerm and then closed the pocket in  layers using  interrupted Vicryls for the deep dermis and a running Prolene for the skin.  Drain was secured using 2-0 nylon, and the Dressings were applied. The  patient was extubated and brought from the operating room to the PACU in  stable condition.          Sharlotte Alamo, MD    cc:   Sharlotte Alamo, MD        SMC/wmx; D: 04/22/2014 12:29 P; T: 04/22/2014 12:48 P; Doc# [id number]; Job#  [id number]

## 2014-04-22 NOTE — Anesthesia Post-Procedure Evaluation (Signed)
Post-Anesthesia Evaluation and Assessment    Patient: Emily [name] MRN: [id number]  SSN: [id number]    Date of Birth: Jul 26, 1951  Age: 63 y.o.  Sex: female       Cardiovascular Function/Vital Signs  Visit Vitals   Item Reading   ??? BP 104/62 mmHg   ??? Pulse 71   ??? Temp 36.6 ??C (97.9 ??F)   ??? Resp 20   ??? Ht 5\' 6"  (1.676 m)   ??? Wt 58.514 kg (129 lb)   ??? BMI 20.83 kg/m2   ??? SpO2 100%       Patient is status post general anesthesia for Procedure(s):  REMOVAL OF LEFT BREAST IMPLANT, WASHOUT AND CLOSURE OF LEFT BREAST WOUND.    Nausea/Vomiting: None    Postoperative hydration reviewed and adequate.    Pain:  Pain Scale 1: Numeric (0 - 10) (04/22/14 1319)  Pain Intensity 1: 8 (04/22/14 1319)   Managed    Neurological Status:   Neuro (WDL): Exceptions to WDL (left hand numbness) (04/22/14 1107)   At baseline    Mental Status and Level of Consciousness: Alert and oriented     Pulmonary Status:   O2 Device: Nasal cannula (04/22/14 1240)   Adequate oxygenation and airway patent    Complications related to anesthesia: None    Post-anesthesia assessment completed. No concerns    Signed By: Anthony Sar, MD     April 22, 2014

## 2014-04-22 NOTE — Anesthesia Pre-Procedure Evaluation (Signed)
Anesthetic History   No history of anesthetic complications  PONV          Review of Systems / Medical History  Patient summary reviewed, nursing notes reviewed and pertinent labs reviewed    Pulmonary  Within defined limits                 Neuro/Psych   Within defined limits           Cardiovascular  Within defined limits                     GI/Hepatic/Renal  Within defined limits              Endo/Other  Within defined limits      Arthritis and cancer     Other Findings              Physical Exam    Airway  Mallampati: II  TM Distance: > 6 cm  Neck ROM: normal range of motion   Mouth opening: Normal     Cardiovascular  Regular rate and rhythm,  S1 and S2 normal,  no murmur, click, rub, or gallop             Dental  No notable dental hx       Pulmonary  Breath sounds clear to auscultation               Abdominal  GI exam deferred       Other Findings            Anesthetic Plan    ASA: 2  Anesthesia type: general          Induction: Intravenous  Anesthetic plan and risks discussed with: Patient

## 2014-04-22 NOTE — Other (Signed)
Dr Edmonia Lynch notified of pt still having pain of 8, orders given

## 2014-04-22 NOTE — Other (Signed)
Pt c/o feeling bad about having her breast implant being taken out.  Called her significant other to talk with her.  Pt was crying and pt felt better after he came to see her.  Geoffery Lyons RN

## 2014-04-22 NOTE — H&P (Signed)
Surgery History and Physical    Subjective:      Emily [name] is a 63 y.o. Caucasian female who presents with exposed implant left side s/p xrt.    Patient Active Problem List    Diagnosis Date Noted   ??? Breast cancer, stage 2 ([place]) 10/22/2012     Past Medical History   Diagnosis Date   ??? Cancer ([place])      left breast  cancer   ??? Anxiety    ??? Arthritis    ??? Nausea & vomiting    ??? Concussion 2011   ??? Chronic tension headaches    ??? Headache(784.0)      OCCAS   ??? Other ill-defined conditions(799.89)      PHEOCHROMOCYTOMA   ??? Other and unspecified symptoms and signs involving general sensations and perceptions      HIVES RECENTLY- WAS IN ER Feb 19, 2013   ??? Lymphedema of arm      left      Past Surgical History   Procedure Laterality Date   ??? Hx orthopaedic       bilateral shoulder arthroscopy   ??? Hx modified radical mastectomy  11/28/2012     LEFT BREAST MODIFIED RADICAL MASTECTOMY WITH AXILLARY DISSECTION W/ RECONSTRUCTION LEFT BREAST WITH TISSUE EXPANDER AND ALLODERM;PORTACATH INSERTION performed by Ty Hilts, MD at [facility]   ??? Hx gyn       c-section   ??? Hx oophorectomy       right   ??? Hx heent       T&A   ??? Hx heent       right adrenal removed   ??? Pr abdomen surgery proc unlisted       hernia mesh repair   ??? Hx appendectomy     ??? Pr abdomen surgery proc unlisted       right adenal removed   ??? Hx vascular access       PORT, THEN REMOVED   ??? Pr breast surgery procedure unlisted  '91     breast implants; replaced bilat implants 2011   ??? Hx breast reconstruction  11/28/2012     BREAST RECONSTRUCTION /C  INSERTION EXPANDER & ALLODERM performed by Sharlotte Alamo, MD at [place]   ??? Hx mastectomy Left    ??? Hx breast reconstruction Bilateral 09/16/2013     REMOVAL AND REPLACEMENT LEFT BREAST IMPLANT TISSUE EXPANDERS WITH SUBTOTAL CAPSULECTOMY, RIGHT BREAST IMPLANT REMOVAL AND REPLACEMENT FOR SYMMETRY/FAT GRAFTING TO BILATERAL BREASTS performed by Sharlotte Alamo, MD at [facility]       History   Substance Use Topics   ??? Smoking status: Never Smoker    ??? Smokeless tobacco: Never Used   ??? Alcohol Use: No      Family History   Problem Relation Age of Onset   ??? Alzheimer Mother    ??? Diabetes Mother    ??? Other Mother      COMBATIVE POST ANESTHESIA   ??? Heart Disease Father    ??? Diabetes Father    ??? Lung Disease Father      COPD   ??? Headache Sister    ??? Migraines Sister    ??? Anesth Problems Neg Hx       Prior to Admission medications    Medication Sig Start Date End Date Taking? Authorizing Provider   escitalopram oxalate (LEXAPRO) 20 mg tablet TAKE 1 TABLET BY MOUTH DAILY. 04/17/14  Yes  Forbes Cellar, NP   letrozole [facility]) 2.5 mg tablet Take 1 Tab by mouth daily. 03/19/14  Yes Joyce Gross, MD   diazepam (VALIUM) 5 mg tablet Take 1 Tab by mouth every six (6) hours as needed for Anxiety (muscle spasm). Max Daily Amount: 20 mg. 1/60/73  Yes Ty Hilts, MD   oxyCODONE-acetaminophen (PERCOCET) 5-325 mg per tablet Take 2 tablets by mouth every four (4) hours as needed. 09/18/13  Yes Sharlotte Alamo, MD   spironolactone (ALDACTONE) 25 mg tablet Take 1 Tab by mouth daily. 04/11/61   Ty Hilts, MD   desloratadine (CLARINEX) 5 mg tablet Take 5 mg by mouth daily.    Historical Provider   DOCUSATE SODIUM (COLACE PO) Take 1 Tab by mouth daily.    Historical Provider     Allergies   Allergen Reactions   ??? Aspirin Unknown (comments)     Gi bleeding   ??? Compazine [Prochlorperazine] Rash   ??? Keflex [Cephalexin] Rash   ??? Levaquin [Levofloxacin] Hives     DENIES ALLERGY   ??? Nsaids (Non-Steroidal Anti-Inflammatory Drug) Swelling     Throat swells   ??? Prednisone Rash     DENIES ALLERGY         Review of Systems:    A comprehensive review of systems was negative except for that written in the History of Present Illness.    Objective:     Patient Vitals for the past 8 hrs:   BP Temp Pulse Resp SpO2 Height Weight   04/22/14 1057 110/48 mmHg 98.1 ??F (36.7 ??C) 72 20 100 % 5\' 6"  (1.676 m)  58.514 kg (129 lb)       Temp (24hrs), Avg:98.1 ??F (36.7 ??C), Min:98.1 ??F (36.7 ??C), Max:98.1 ??F (36.7 ??C)      Physical Exam:  General:  Alert, cooperative, no distress, appears stated age.   Eyes:  Conjunctivae/corneas clear. PERRL, EOMs intact. Fundi benign   Ears:  Normal TMs and external ear canals both ears.   Nose: Nares normal. Septum midline. Mucosa normal. No drainage or sinus tenderness.   Mouth/Throat: Lips, mucosa, and tongue normal. Teeth and gums normal.   Neck: Supple, symmetrical, trachea midline, no adenopathy, thyroid: no enlargment/tenderness/nodules, no carotid bruit and no JVD.   Back:   Symmetric, no curvature. ROM normal. No CVA tenderness.   Lungs:   Clear to auscultation bilaterally.   Heart:  Regular rate and rhythm, S1, S2 normal, no murmur, click, rub or gallop.   Abdomen:   Soft, non-tender. Bowel sounds normal. No masses,  No organomegaly.   Extremities: Extremities normal, atraumatic, no cyanosis or edema.   Pulses: 2+ and symmetric all extremities.   Skin: Left breast inflamed and red, exposed implant   Lymph nodes: Cervical, supraclavicular, and axillary nodes normal.   Neurologic: CNII-XII intact. Normal strength, sensation and reflexes throughout.       Labs: No results found for this or any previous visit (from the past 24 hour(s)).    Data Review:    CBC:   Lab Results   Component Value Date/Time    WBC 4.7 09/09/2013 10:19 AM    RBC 4.17 09/09/2013 10:19 AM    HGB 12.2 09/09/2013 10:19 AM    HCT 37.0 09/09/2013 10:19 AM    PLATELET 253 09/09/2013 10:19 AM        Assessment:     Active Problems:    * No active hospital problems. *  Plan:     Washout remove implant, drain placement    Signed By: Sharlotte Alamo, MD     April 22, 2014

## 2014-04-22 NOTE — Op Note (Signed)
Name:      Emily [name], JUHASZ                                          Surgeon:        Sharlotte Alamo, MD  Account #: [id number]                 Surgery Date:   04/22/2014  DOB:       1950-10-05  Age:       63                           Location:                                 OPERATIVE REPORT      PREOPERATIVE DIAGNOSIS: Cellulitis with exposed left breast implant.    POSTOPERATIVE DIAGNOSIS: Cellulitis with exposed left breast implant.    PROCEDURES PERFORMED: Removal of left breast implant and washout.    SURGEON: Sharlotte Alamo, MD.    ANESTHESIA: General.    ESTIMATED BLOOD LOSS: Minimal.    DRAINS AND TUBES: None.    COMPLICATIONS: None.    SPECIMENS REMOVED: Mastectomy skin and culture.    INDICATIONS: The patient is a 63 year old white female status post left  total mastectomy for breast cancer. She was radiated. She developed some  cellulitis in her left breast recently and this progressed to  full-thickness necrosis over the site where she had a nipple reconstruction  with exposure of the implant. Prior to procedure, the risks and benefits  were discussed with the patient. Informed consent was obtained.    DESCRIPTION OF PROCEDURE: On the day of surgery, in the preoperative  holding area. The left breast was marked. She was taken to the operating  room and placed supine on the operating table. After induction of general  anesthesia, the left breast was prepped and draped in standard fashion.  Following preoperative time-out, I began the case. I opened up this pocket  resecting mastectomy skin and submitted this as specimen. I then did a culture  deeply to see if there was anything growing. I then removed the implant, which was 500 mL  high-profile Mentor implant, which was intact. I then pulse lavaged the  area out with a liter of triple antibiotic irrigation, consisting of  vancomycin, gentamicin and bacitracin. I then placed a drain and I debrided   away some loose AlloDerm and then closed the pocket in layers using  interrupted Vicryls for the deep dermis and a running Prolene for the skin.  Drain was secured using 2-0 nylon, and the Dressings were applied. The  patient was extubated and brought from the operating room to the PACU in  stable condition.          Sharlotte Alamo, MD    cc:   Sharlotte Alamo, MD        SMC/wmx; D: 04/22/2014 12:29 P; T: 04/22/2014 12:48 P; Doc# [id number]; Job#  [id number]

## 2014-04-22 NOTE — Brief Op Note (Signed)
BRIEF OPERATIVE NOTE    Date of Procedure: 04/22/2014   Preoperative Diagnosis: BREAST CELLULITIS  Postoperative Diagnosis: BREAST CELLULITIS    Procedure(s):  WASHOUT AND CLOSURE OF LEFT BREAST WOUND  Surgeon(s) and Role:     * Sharlotte Alamo, MD - Primary  Anesthesia: General   Estimated Blood Loss: minimal  Specimens:   ID Type Source Tests Collected by Time Destination   1 : Left Mastectomy Skin Fresh Breast  Sharlotte Alamo, MD 04/22/2014 1206 Pathology   1 : Left Breast Pocket Wound Breast ANAEROBIC AND AEROBIC CULTURE Sharlotte Alamo, MD 04/22/2014 1208 Microbiology      Findings:exposed implant with cellulitis  Complications:none  Implants: * No implants in log *

## 2014-04-25 MED FILL — PHENYLEPHRINE IN 0.9 % SODIUM CL (40 MCG/ML) IV SYRINGE: 0.4 mg/10 mL (40 mcg/mL) | INTRAVENOUS | Qty: 240

## 2014-04-25 MED FILL — DIPRIVAN 10 MG/ML INTRAVENOUS EMULSION: 10 mg/mL | INTRAVENOUS | Qty: 20

## 2014-04-25 MED FILL — DEXAMETHASONE SODIUM PHOSPHATE 4 MG/ML IJ SOLN: 4 mg/mL | INTRAMUSCULAR | Qty: 1

## 2014-04-25 MED FILL — ONDANSETRON (PF) 4 MG/2 ML INJECTION: 4 mg/2 mL | INTRAMUSCULAR | Qty: 2

## 2014-04-25 MED FILL — XYLOCAINE-MPF 20 MG/ML (2 %) INJECTION SOLUTION: 20 mg/mL (2 %) | INTRAMUSCULAR | Qty: 60

## 2014-04-25 NOTE — Progress Notes (Signed)
[name]  Lymphedema Clinic and Cancer Rehabilitation  [address] [place]  [address]      LYmphedema Therapy  Visit: 17            Daily note                      30 day/10th visit progress note    NAME: Emily [name]  DATE: 04/25/2014      GOALS  Short term goals  Time frame: 2-4 weeks  1. Instruct the patient to be independent with proper skin care to prevent future skin breakdown and decrease the potential risk for infections that are associated with Lymphedema. 01/24/2014 GOAL MET  2. Patient will be independent with a personal lymphedema exercise program to assist with the lymphatic flow and reduce L UE limb volume by 50-75%, under 3-5% limb volume discrepancy noted. 01/24/2014 ongoing 03/07/2014 Increase in limb volume noted today. Continue goal. 03/21/2014 Limb volume discrepancy remains over 20% at this time.  3. Patient will understand the signs and symptoms of acute infection.   Long term goals  Time frame: 4-8 weeks  1. Patient will have knowledge of the compression options and acquire a safe and appropriate daytime and night time compression system to prevent re-accumulation of fluid. 01/24/2014 fit new compression sleeve--see note below. 03/07/2014 initiated compression bandaging. 03/21/2014 continuing compression bandaging. 03/26/2014 Completed measurements for custom flat knit compression sleeve/glove/night time compression system. 04/25/2014 Good fit of custom flat knit compression sleeve, Solaris Tribute night time compression garment.  Poor fit of custom flat knit compression glove.  2. Patient or family will be able to don/doff garments independently. Garment system effectiveness will be evaluated prior to discharge for better long-term management and outcomes. 03/21/2014 ongoing education regarding compression garments options when indicated per progress with  treatment. 04/25/2014  Pt able to don/doff compression sleeve/night time [employer].  Unable to don glove.  3. Improvement in UEFS by 4-8 points allowing pt to perform at shoulder level activities with 75% less pain.   4. L shoulder ROM flex/abd improved by 10-20 degrees with 75% less pain at end range. 03/21/2014 Improvement in shoulder ROM noted. Pain persists.  5. Pt independent in self MLD, implant mobilization.  6. To transition patient to independent restorative phase of CDT.    SUBJECTIVE REPORT:  Patient arrives with compression sleeve in place, stating she is unable to tolerate donning and wear of custom flat knit compression glove.  Pt states she is seeing [name] after this appt, and then going to [facility] for a week for vacation. Pt states she had L breast implant removed secondary to infection.  Drain remains in place, which she states she believes will be removed today.  Pt arrives for abbreviated visit today for compression garment management and to monitor limb volume post surgery.  L breast covered, with therapist not assessing secondary to pt having MD appt directly following this appt.   Pain: 5-8/10, increased at the end of the day and at end range of available shoulder flex/abd. Reports taking pain meds at night before bed.  Gait: indep  ADLs: indep  Treatment Response: Note continued elevated limb volume with repeat measurements as noted below. Phase  I CDT including compression bandaging continued today.   Reviewed packet received at evaluation.  Function: see eval.  TREATMENT AND OBJECTIVE DATA SUMMARY:     Manual therapy: 12:00-12:10 pm  Orthotic management:  12:11-12:30 pm 10 minutes  19 minutes     Area to decongest:   L UE     Manual therapy:   Limb volume measurements completed today with limb volume slightly elevated, however, relatively stable considering recent procedure.  See scanned graph.     Orthotic management: Therapist assessed fit of custom flat knit  compression sleeve, with pt requiring education regarding appropriate donning of proximal aspect of garment.  Pt able to don garment indep with instruction, additional time, with good fit noted.  Pt dons Solaris Tribute in clinic indep, with good compression marks noted, indicating pt requires wear of garment only at this time, with outer jacket to be added only as needed in the event that limb volume fluctuations or elevations present.  Therapist attempted to assist pt with donning of custom flat knit compression glove without success.  Note tension at wrist measurement not allowing glove to go over hand. Discussion regarding option of adding zipper to glove vs requesting garment remake, with therapist to discuss options with manufacturer.  Pt in agreement with plan at this time.  Pt advised to wear RM gauntlets which she has currently with compression sleeve to prevent progression of hand involvement, with pt in agreement with this plan.  Hand will also be addressed at night with nightly wear of Solaris Tribute.       Kinesiotaping:   deferred         TOTAL TREATMENT   30 mins     ASSESSMENT:     Treatment effectiveness and tolerance:  Patient with slight increase in volumes post-op on the Left elbow and upper arm this visit.  Good fit of daytime compression sleeve/night time Solaris attained, however, compression glove does not fit pt at this time.  Considering adding zipper to palmar aspect of hand based on manufacturer's recommendation.  Pt to wear RM gauntlet until custom glove concerns resolved.   Progress toward goals:  Transitioned to RM gauntlet, custom flat knit compression sleeve for daytime, and custom Solaris Tribute for night time compression.   PLAN OF CARE:     Changes to the plan of care:  Reassess limb volumes upon pt return from her vacation, and consider need for further manual therapy to address continued AWS symptoms, limited shoulder function, and compression garment  management.  Fit altered compression glove upon receiving.    Frequency:  1-2x/week    Other:  Pt lives long distance from clinic, which is impeding consistency of treatment at this time.            Kalman Shan, PT, CLT  60 minutes

## 2014-04-26 LAB — CULTURE, ANAEROBIC: Culture result:: NO GROWTH

## 2014-04-26 LAB — CULTURE, WOUND W GRAM STAIN
Culture result:: NO GROWTH
GRAM STAIN: NONE SEEN

## 2014-05-01 ENCOUNTER — Encounter

## 2014-05-05 NOTE — Progress Notes (Deleted)
HISTORY OF PRESENT ILLNESS  Emily [name] is a 63 y.o. female.  HPI ESTABLISHED patient here for follow up LEFT breast cancer.  She had a mastectomy with reconstruction 11/2012.  She had fat grafting and nipple placed on the LEFT breast in April.  Since that surgery she hasn't felt well.  There was a stitch and a hole that she kept asking [name] about.  He snipped the stitch and fluid started oozing out of the hole.  She had her LEFT implant removed July 21/2015.  She is feeling better but is emotionally upset.  She is not happy with [name] suggestion of reconstruction and would like to talk about another plan.  Patient is having some pain to the LEFT lower breast.      ROS    Physical Exam    ASSESSMENT and PLAN  {ASSESSMENT/PLAN:19072}

## 2014-05-05 NOTE — Patient Instructions (Signed)
Learning About Physical Activity  What is physical activity?  Physical activity is any kind of activity that gets your body moving. Being active means allowing your body to "practice" breathing, stretching, and lifting. The more practice your body gets, the better it works.  The types of physical activity that can help you get fit and stay healthy include:  ?? Aerobic or "cardio" activities that make your heart beat faster and make you breathe harder, such as brisk walking, riding a bike, or running. Aerobic activities strengthen your heart and lungs and build up your endurance.  ?? Strength training activities that make your muscles work against, or "resist," something, such as lifting weights or doing push-ups. These activities help tone and strengthen your muscles.  ?? Stretches that allow you to move your joints and muscles through their full range of motion. Stretching helps you be more flexible and avoid injury.  What are the benefits of physical activity?  Being active is one of the best things you can do to get fit and stay healthy. It helps you to:  ?? Feel stronger and have more energy to do all the things you like to do.  ?? Focus better at school or work and perform better in sports.  ?? Feel, think, and sleep better.  ?? Reach and stay at a healthy weight.  ?? Lose fat and build lean muscle.  ?? Lower your risk for serious health problems.  ?? Keep your bones, muscles, and joints strong.  Being fit lets you do more physical activity. And it lets you work out harder without as much effort.  How can you make physical activity part of your life?  Get at least 30 minutes of exercise on most days of the week. Walking is a good choice. You also may want to do other activities, such as running, swimming, cycling, or playing tennis or team sports.  Pick activities that you like???ones that make your heart beat faster, your muscles stronger, and your muscles and joints more flexible. If you find  more than one thing you like doing, do them all. You don't have to do the same thing every day.  Get your heart pumping every day. Any activity that makes your heart beat faster and keeps it at that rate for a while counts.  Here are some great ways to get your heart beating faster:  ?? Go for a brisk walk, run, or bike ride.  ?? Go for a hike or swim.  ?? Go in-line skating.  ?? Play a game of touch football, basketball, or soccer.  ?? Ride a bike.  ?? Play tennis or racquetball.  ?? Climb stairs.  Even some household chores can be aerobic???just do them at a faster pace. Vacuuming, raking or mowing the lawn, sweeping the garage, and washing and waxing the car all can help get your heart rate up.  Strengthen your muscles during the week. You don't have to lift heavy weights or grow big, bulky muscles to get stronger. Doing a few simple activities that make your muscles work against, or "resist," something can help you get stronger.  For example, you can:  ?? Do push-ups or sit-ups, which use your own body weight as resistance.  ?? Lift weights or dumbbells or use stretch bands at home or in a gym or community center.  Stretch your muscles often. Stretching will help you as you become more active. It can help you stay flexible, loosen tight muscles, and avoid injury.   It can also help improve your balance and posture and can be a great way to relax.  Be sure to stretch the muscles you'll be using when you work out. It???s best to warm your muscles slightly before you stretch them. Walk or do some other light aerobic activity for a few minutes, and then start stretching.  When you stretch your muscles:  ?? Do it slowly. Stretching is not about going fast or making sudden movements.  ?? Don't push or bounce during a stretch.  ?? Hold each stretch for at least 15 to 30 seconds, if you can. You should feel a stretch in the muscle, but not pain.  ?? Breathe out as you do the stretch. Then breathe in as you hold the  stretch. Don't hold your breath.  If you're worried about how more activity might affect your health, have a checkup before you start. Follow any special advice your doctor gives you for getting a smart start.   Where can you learn more?   Go to http://www.healthwise.net/[facility]  Enter W332 in the search box to learn more about "Learning About Physical Activity."   ?? 2006-2015 Healthwise, Incorporated. Care instructions adapted under license by [place] (which disclaims liability or warranty for this information). This care instruction is for use with your licensed healthcare professional. If you have questions about a medical condition or this instruction, always ask your healthcare professional. Healthwise, Incorporated disclaims any warranty or liability for your use of this information.  Content Version: 10.5.422740; Current as of: August 16, 2013

## 2014-05-05 NOTE — Communication Body (Signed)
HISTORY OF PRESENT ILLNESS  Emily [name] is a 63 y.o. female.  HPI   ESTABLISHED patient here for follow up LEFT breast cancer. She had a mastectomy with reconstruction 11/2012. She had fat grafting and nipple placed on the LEFT breast in April. Since that surgery she hasn't felt well. There was a stitch and a hole that she kept asking [name] about. He snipped the stitch and fluid started oozing out of the hole. She had her LEFT implant removed July 21/2015. She is feeling better but is emotionally upset. She is not happy with [name] suggestion of reconstruction and would like to talk about another plan. Patient is having some pain to the LEFT lower breast.     ROS    Physical Exam   Cardiovascular: Normal rate and normal heart sounds.    Pulmonary/Chest: Breath sounds normal. Right breast exhibits no inverted nipple, no mass, no nipple discharge, no skin change and no tenderness. Left breast exhibits no mass, no skin change and no tenderness.       Lymphadenopathy:        Right cervical: No superficial cervical, no deep cervical and no posterior cervical adenopathy present.       Left cervical: No superficial cervical, no deep cervical and no posterior cervical adenopathy present.        Right axillary: No pectoral and no lateral adenopathy present.        Left axillary: No pectoral and no lateral adenopathy present.    ASSESSMENT and PLAN  Encounter Diagnoses   Name Primary?   ??? Breast cancer, stage 2, left ([place]) Yes   Lost implant after fat grafting, is getting a second opinion with another Psychiatric nurse. Will see her back in 6 months.    Written by Leonia Reader, scribe, as dictated by Sandford Craze, MD

## 2014-05-05 NOTE — Progress Notes (Signed)
HISTORY OF PRESENT ILLNESS  Emily [name] is a 63 y.o. female.  HPI   ESTABLISHED patient here for follow up LEFT breast cancer. She had a mastectomy with reconstruction 11/2012. She had fat grafting and nipple placed on the LEFT breast in April. Since that surgery she hasn't felt well. There was a stitch and a hole that she kept asking [name] about. He snipped the stitch and fluid started oozing out of the hole. She had her LEFT implant removed July 21/2015. She is feeling better but is emotionally upset. She is not happy with [name] suggestion of reconstruction and would like to talk about another plan. Patient is having some pain to the LEFT lower breast.     ROS    Physical Exam   Cardiovascular: Normal rate and normal heart sounds.    Pulmonary/Chest: Breath sounds normal. Right breast exhibits no inverted nipple, no mass, no nipple discharge, no skin change and no tenderness. Left breast exhibits no mass, no skin change and no tenderness.       Lymphadenopathy:        Right cervical: No superficial cervical, no deep cervical and no posterior cervical adenopathy present.       Left cervical: No superficial cervical, no deep cervical and no posterior cervical adenopathy present.        Right axillary: No pectoral and no lateral adenopathy present.        Left axillary: No pectoral and no lateral adenopathy present.    ASSESSMENT and PLAN  Encounter Diagnoses   Name Primary?   ??? Breast cancer, stage 2, left ([place]) Yes   Lost implant after fat grafting, is getting a second opinion with another Psychiatric nurse. Will see her back in 6 months. May need a flap for reconstruction.    Written by Leonia Reader, scribe, as dictated by Sandford Craze, MD

## 2014-05-07 NOTE — Progress Notes (Signed)
[facility]  [address]  [place]  [address]      Re-EVALUATION    NAME: Emily [name] AGE: 63 y.o.  GENDER: female  DATE: 05/07/2014  REFERRING PHYSICIAN: Michaele Offer, MD   HISTORY AND BACKGROUND:  Pt is a 63 y/o female found to have 2.6 cm LN L axilla 09/24/2012.  Underwent biopsy on 10/02/2012 with findings of metastatic neoplasm.  MRI on 10/05/2012 revealed L breast mass in addition to lymph node.  Biopsy of breast mass on 10/16/2012 showed poorly differentiated carcinoma.  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  Pt completed chemotherapy and XRT.  Pt received care to address L UE lymphedema at [facility], prior to coming to this clinic. Pt presented at initial eval with pain L axilla, reporting "fluid pocket" in region, severe pain with shoulder movement, and feeling of "fullness" L upper arm. Pt stated symptoms presented post surgery.  Pt reports bilat shoulder dysfunction prior to cancer diagnosis, having undergone R rotator cuff repair 7 years ago, L rotator cuff repair 3 years ago. Pt is s/p bilateral breast reconstruction 09/16/2013.  States additional reconstructive surgery scheduled for 01/30/2014.      Pt returns today s/p L breast implant removal secondary to infection.  Pt states she is seeking a second opinion regarding reconstructive surgery in order to consider all of her options.     Primary Diagnosis:  ?? 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 Malignant neoplasm of breast (174.0)  ?? Pain in joint (719.41)  Date of Onset: 11/29/2011  Present Symptoms and Functional Limitations: Pain L axilla/upper arm/upper  quadrant anterior/lateral aspect.  L UE lymphedema.  LLIS lymphedema outcomes measure:  52/90; indicating 51% impairment.  Past Medical History:   Past Medical History   Diagnosis Date   ??? Cancer ([place])      left breast  cancer   ??? Anxiety    ??? Arthritis    ??? Nausea & vomiting    ??? Concussion 2011   ??? Chronic tension headaches    ??? Headache(784.0)      OCCAS   ??? Other ill-defined conditions(799.89)      PHEOCHROMOCYTOMA   ??? Other and unspecified symptoms and signs involving general sensations and perceptions      HIVES RECENTLY- WAS IN ER Feb 19, 2013   ??? Lymphedema of arm      left     Past Surgical History   Procedure Laterality Date   ??? Hx orthopaedic       bilateral shoulder arthroscopy   ??? Hx modified radical mastectomy  11/28/2012     LEFT BREAST MODIFIED RADICAL MASTECTOMY WITH AXILLARY DISSECTION W/ RECONSTRUCTION LEFT BREAST WITH TISSUE EXPANDER AND ALLODERM;PORTACATH INSERTION performed by Ty Hilts, MD at [place]   ??? Hx gyn       c-section   ??? Hx oophorectomy       right   ??? Hx heent       T&A   ??? Hx heent       right adrenal removed   ??? Pr abdomen surgery proc unlisted       hernia mesh repair   ??? Hx  appendectomy     ??? Pr abdomen surgery proc unlisted       right adenal removed   ??? Hx vascular access       PORT, THEN REMOVED   ??? Pr breast surgery procedure unlisted  '91     breast implants; replaced bilat implants 2011   ??? Hx breast reconstruction  11/28/2012     BREAST RECONSTRUCTION /C  INSERTION EXPANDER & ALLODERM performed by Sharlotte Alamo, MD at [place]   ??? Hx mastectomy Left    ??? Hx breast reconstruction Bilateral 09/16/2013     REMOVAL AND REPLACEMENT LEFT BREAST IMPLANT TISSUE EXPANDERS WITH SUBTOTAL CAPSULECTOMY, RIGHT BREAST IMPLANT REMOVAL AND REPLACEMENT FOR SYMMETRY/FAT GRAFTING TO BILATERAL BREASTS performed by Sharlotte Alamo, MD at [place]   ??? Hx breast augmentation Left 04/22/2014     REMOVAL OF LEFT BREAST IMPLANT, WASHOUT AND CLOSURE OF LEFT BREAST WOUND  performed by Sharlotte Alamo, MD at [facility] MAIN OR     Current Medications:    Current Outpatient Prescriptions   Medication Sig   ??? oxyCODONE-acetaminophen (PERCOCET) 5-325 mg per tablet Take 2 Tabs by mouth every six (6) hours as needed for Pain. Max Daily Amount: 8 Tabs.   ??? escitalopram oxalate (LEXAPRO) 20 mg tablet TAKE 1 TABLET BY MOUTH DAILY.   ??? letrozole (FEMARA) 2.5 mg tablet Take 1 Tab by mouth daily.   ??? diazepam (VALIUM) 5 mg tablet Take 1 Tab by mouth every six (6) hours as needed for Anxiety (muscle spasm). Max Daily Amount: 20 mg.   ??? spironolactone (ALDACTONE) 25 mg tablet Take 1 Tab by mouth daily.   ??? oxyCODONE-acetaminophen (PERCOCET) 5-325 mg per tablet Take 2 tablets by mouth every four (4) hours as needed.     No current facility-administered medications for this encounter.     Allergies:   Allergies   Allergen Reactions   ??? Aspirin Unknown (comments)     Gi bleeding   ??? Compazine [Prochlorperazine] Rash   ??? Keflex [Cephalexin] Rash   ??? Levaquin [Levofloxacin] Hives     DENIES ALLERGY   ??? Nsaids (Non-Steroidal Anti-Inflammatory Drug) Swelling     Throat swells   ??? Prednisone Rash     DENIES ALLERGY      Social/Work History and Prior Level of Function:  Pt reports continuing intermittent work schedule.  Pt lives in [place].  Pt has a 61 year old son which she and her partner care for.   Living Situation: Pt lives near her work place, which is a good distance from this clinic, probably an hour one way.  Lives with boyfriend and young son.     Trainable Caregiver?: Pt's significant other is an ob-gyn, interested in patient's condition.     Self-care/ADLs: performing mod I, additional time, UE dressing deliberately to avoid pain L UE.     Mobility: indep transfers, bed mobility, gait in clinic.   Sleeping Arrangement:  Bed.   Adaptive Equipment Owned: na  Previous Therapy:  Pt received therapy for lymphedema management at [facility]. 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  Pt experienced progression of stage 0 lymphedema to stage II lymphedema L UE end of May, with increase in limb volume beginning of June 2015, with compression bandaging implemented at that time.  Compression/Lymphedema Equipment:  Custom flat knit compression sleeve and RM gauntlet while awaiting re-make of custom flat  knit compression glove; Solaris Tribute arm sleeve; Lymphapress Comfy sleeve pump.    SUBJECTIVE:   Pt arrives today with daytime compression garments in place.  Pt expresses distress regarding loss of L breast implant.  Pt states she regrets having nipple reconstruction and fat grafting done as she believes this caused infection and placed more "stress on my lymphatic system."  Pt states reconstruction offered to her by current surgeon was "extensive", with pt planning to seek a second opinion.  Appt for second opinion regarding reconstruction scheduled for next week.  Pt reports she has continued with day/night time compression garment wear with garments found to be comfortable.  Pt reports that she is feeling better physically overall after implant removal, stating flu-like symptoms have subsided.  Pt continues with pain related to AWS, increased at end range of shoulder flex/abd.   Patient???s goals for therapy: Relieve pain with L shoulder function; Decrease L UE swelling.    EVALUATION AND OBJECTIVE DATA SUMMARY:   Pain: L lateral upper quadrant/lat region/axilla/pect 6/10 end range shoulder flex/abd; 5/10 at rest with shoulder in neutral position, primarily at night.            Skin and Tissue Assessment:  Dermal Status:  ( x)  Intact: L UE ( )  Dry   ( )  Tenuous:  ( )  Flaky   ( )  Wound/lesion present ( x)  Scars:  Incision L breast, stitches intact.   ( )  Dermatitis    Texture/Consistency:   (x )  Boggy:  Forearm most problematic area at this time. ( )  Pitting Edema   ( )  Brawny (x )  Implants: bilateral mobile medial/superior gentle pressure, with pt without c/o.   ( x)  Fibrotic/Woody:  Forearm/elbow/distal upper arm.    Pigmentation/Color Change:  ( )  Normal:   ( )  Hemosiderin   ( )  Red:  ( )  Erythematous   (x )  Hyperpigmented:  Mild L UE. ( )  Hyperlipodermatosclerosis   Anomalies:    (x )  Axillary web syndrome:  Note palpable cord noted L axilla, with radiating symptoms into upper arm with shoulder abduction/flexion.   ( )  Vesicles   ( )  Petechiae ( )  Warty Vercusis   ( )  Bullae ( )  Papilloma   Circulatory:  ( )  ABI ( )  Varicosities:   ( x)  Pulses: Palpable radial. ( )  Vascular studies ruled out DVT in past   ( )  DVT History    Nails:  (x )  Normal  ( )  Fungus  Stemmers Sign: negative  (36 or greater: adversely affecting lymphedema)  Volumetric Measurements:   Right: 1665.83 mL Left:  1836.46 mL   % Difference: 10.24% Dominance: Right   (See scanned graph)  Range of Motion: R shoulder flex 125, abd 120, ER 65, IR 45, with elbow, wrist, hand WFL.  L shoulder Flex 95, Abd 88, ER 45, IR 45.  Pt unable to place hand behind head, behind back. All shoulder movement guarded bilaterally, L>R.  Strength: Shoulder strength bilat grossly 4-/5 within available ROM, limited by pain/weakness. Tricep 3+/5, bicep 4-5, wrist, hand R 4/5, L 4/5.  Sensation:  Pt c/o numbness upper arm, lateral upper quadrant.  Palpation:  Note axillary web syndrome, palpable cording noted axilla. Note trigger points and muscular tension increased pect major/minor, lat, subscapularis, upper trap, rhomboid.  Posture: Note forward head/shoulder posture  noted.  Mobility:  Bed/Chair Mobility:  indep Transfers:  indep   Sitting Balance:  good Standing Balance:  good   Gait:  indep Wheelchair Mobility:  na   Endurance:  Appears intact for current level of activity, including gait  community distances, working abbreviated schedule.  Pt currently on lifting restrictions s/p surgery.  Stairs:  Not assessed.       Safety:  Patient is alert and oriented:  x4   Safety awareness:  intact   Fall Risk?:  Low, use of pain meds evening hours.      Precautions:  Standard lymphedema precautions to include avoiding blood pressure readings, injections and IVs or other procedures/acts that could lead to broken skin on affected area, and avoiding excessive heat, resistive activity or altitude without compression garment    Evaluation Time: 8:34-9:00 am  26 minutes    TREATMENT PROVIDED:   1.  Treatment description:  Gentle STM performed sub scapularis, pect major/minor, with multiple trigger points noted in musculature. Therapist assessed fit of compression sleeve, with girth measurements within parameters of currently worn size.  MLD sequence performed L UE, stim supraclavicular lnn/shoulder collectors/cephalic vasa vasorum.  Medial to lateral techniques upper arm.  Pt advised to speak with [name] regarding when she can resume use of vasopneumatic pump, from a post-surgical standpoint, as this will benefit pt when medically cleared.  Pt instructed that if [name] states she can resume use of Lymphapress Comfy sleeve pump, she can begin with 30 minute sessions daily as tolerated at lowest setting of pressure, and report tolerance to therapist at next scheduled appt.  If pump use continues to be inappropriate for pt per surgeon, pt can continue self MLD daily.  Pt instructed to continue day/night time wear of compression garments as instructed, following instructions:  Compression garment routine:  1.  Apply daytime compression stocking to clean, dry extremity first thing in the morning, EVERY MORNING.    2. Wear garment until bedtime, adjusting throughout the day as needed should garment wrinkle or crease.  Problem areas can be at joint, and top of garment, ensuring proximal aspect of garment is positioned  appropriately on extremity.  3. Launder compression garments nightly either by hand or washing machine in a garment bag or pillowcase.  Use mild detergent with NO FABRIC SOFTENER, NO BLEACH, NO WOOLITE.  Wash in cool/warm water.    4. Dry garment in dryer on low heat right side out in garment bag or pillowcase.  5. Daytime compression garments are NOT safe to sleep in, so remove at bedtime.    6. Perform skin care to extremity, cleansing skin daily with soap and water, and using low pH lotion, upward strokes to stimulate lymphatic vessels.   7. Apply night time compression garment as instructed to wear while sleeping, adjusting throughout the night as needed for comfort.  8. Perform exercise program with daytime compression stockings on.  Signs/Symptoms of infection include:  Fever, flu-like symptoms, pain/redness/warmth in extremity, sometimes with increase in swelling present.  Stop wearing your compression garment if this occurs.  Call your doctor immediately or go to the Emergency room to address potential infection.  Remove compression with changes in circulation or numbness in extremity, different from what you may experience when not wearing compression garment, pain, unexplained shortness of breath.    Treatment time:  9:01-9:36 am  Manual therapy 2         ASSESSMENT:   Emily [name] is a 63 y.o.  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  Patient's has undergone multiple reconstructive procedures, with recent removal of implant L breast secondary to infection, and progression of lymphedema, with focus of treatment changing from addressing shoulder function/pain, to treating lymphedema and transitioning to compression garments.  Pt  Underwent recent removal of L breast implant secondary to infection, with pt  expressing distress regarding this.  Pt has plan to seek second opinion regarding reconstruction options.  Limited MLD today to address L UE, avoiding truncal sequence with stitches intact L chest.  Shoulder dysfunction noted bilaterally, which pt reports was present prior to mastectomy, which has further limited shoulder function with increase in pain. Question neuropathic pain in conjunction with pain and limitations currently present with AWS.  Will continue to assess as treatment progresses. Patient will benefit from complete decongestive therapy (CDT) including manual lymphatic drainage (MLD) technique as indicated, compression garment to be worn daily as instructed, and kinesiotaping as appropriate.  Patient will receive instruction in proper skin care to recognize signs/symptoms of and prevent infection, therapeutic exercise, and self-MLD for independent home program and restorative lymphatic performance.  Awaiting custom flat knit compression glove for fit with alterations requested.  Pt may require reduced visits to clinic once she is cleared by surgeon to resume daily use of vasopneumatic pump device to improve lymphatic return.  Currently, shoulder dysfunction, pain, and palpable axillary webbing warrant further treatment in this clinic at this time.  Pt will require additional daytime compression garments, with measurements to be completed in next 1-3 visits.    This care is medically necessary due to the infection risk with lymphedema, and to improve functional activities.  CDT is necessary to resolve swelling to allow patient to return to wearing normal clothes/footwear, and prevent worsening of symptoms, such as venous stasis ulcerations, infections, or hospitalizations.  Patient will be independent with home program strategies to allow improved ADL ability and mobility and to allow patient to return to greatest functional independence.     Rehabilitation potential is considered to be Good for goals.  Factors which may influence rehabilitation potential include significant pain per pt report continues as noted previous visits at this time possibly limiting treatment, as well as prior shoulder dysfunction still present, however, pt motivated to improve, progression of lymphedema L UE.  Patient will benefit from 1-2x/week physical therapy visits over 4-8 weeks to optimize improvement in these areas.    PLAN OF CARE:   Recommendations and Planned Interventions:  Manual lymph drainage/complete decongestive therapy  Joint mobilization/manual therapy  Compression garment fitting/provision  Lymphedema therapeutic exercise  AROM/PROM/Strength/Coordination  Self-care training  Education in skin care and lymphedema precautions  Self-MLD education per home program  Self-bandaging education per home program  Caregiver education as needed  Vasopneumatic pump for home use as indicated and tolerated     GOALS  Short term goals  Time frame: 2-4 weeks  1. Instruct the patient to be independent with proper skin care to prevent future skin breakdown and decrease the potential risk for infections that are associated with Lymphedema.  2. Patient will be independent with a personal lymphedema exercise program to assist with the lymphatic flow and reduce L UE limb volume under 5-7% limb volume discrepancy noted.  3. Patient will understand the signs and symptoms of acute infection.  Long term  goals  Time frame: 4-8 weeks  1.   Patient will have knowledge of the compression options and acquire a safe and   appropriate daytime and night time compression system to prevent    re-accumulation of fluid.  2.  Patient or family will be able to don/doff garments independently.  Garment   system effectiveness will be evaluated prior to discharge for better long-term   management and outcomes.  3.  Improvement in LLIS by 4-8 points with improvement in lymphedema,  transitioning to home management phase of treatment.  4.  L shoulder ROM flex/abd improved by 10-20 degrees with 75% less pain at end range.   5.  Pt independent in self MLD.  6.   To transition patient to independent restorative phase of CDT.           Patient has participated in goal setting and agrees to work toward plan of care.  Patient was instructed to call if questions or concerns arise.    Thank you for this referral.  Kalman Shan, PT, CLT    Time Calculation: 62 mins    TREATMENT PLAN EFFECTIVE DATES:   05/07/2014 to 08/02/2014  I have read the above plan of care for [facility].  I certify the above prescribed services are required by this patient and are medically necessary.  The above plan of care has been developed in conjunction with the lymphedema/physical therapist.       Physician Signature: ____________________________________Date:______________

## 2014-05-14 NOTE — Progress Notes (Signed)
Emily [name] [facility]  [address] [place]  [address]      LYmphedema Therapy  Visit: 2            Daily note                      30 day/10th visit progress note    NAME: Emily [name]  DATE: 05/14/2014    GOALS  Short term goals  Time frame: 2-4 weeks  1. Instruct the patient to be independent with proper skin care to prevent future skin breakdown and decrease the potential risk for infections that are associated with Lymphedema.  2. Patient will be independent with a personal lymphedema exercise program to assist with the lymphatic flow and reduce L UE limb volume under 5-7% limb volume discrepancy noted.  3. Patient will understand the signs and symptoms of acute infection.  Long term goals  Time frame: 4-8 weeks  1. Patient will have knowledge of the compression options and acquire a safe and  appropriate daytime and night time compression system to prevent  re-accumulation of fluid.  2. Patient or family will be able to don/doff garments independently. Garment  system effectiveness will be evaluated prior to discharge for better long-term  management and outcomes.  3. Improvement in LLIS by 4-8 points with improvement in lymphedema, transitioning to home management phase of treatment.  4. L shoulder ROM flex/abd improved by 10-20 degrees with 75% less pain at end range.   5. Pt independent in self MLD.  6. To transition patient to independent restorative phase of CDT.       SUBJECTIVE REPORT:  Pt returns to treatment with compression sleeve and gauntlet donned.  Pt states per [name], she resumed use of vasopneumatic pump at home 30 minutes each day without c/o.  Pt states she continues day/night time compression garment wear, and nightly skin care.  Pt states she takes a dose of valium each night to improve her ROM L shoulder with  exercise, however, did not take it last night.  Pt states she had fluid aspirated from L chest surgical site since most recent visit.  Pt states she did pursue a second opinion regarding reconstruction surgical options.   Pain: 6-8/10 end range shoulder flex/abd.             Gait:  indep  ADLs:  indep  Treatment Response:  Elevated limb volumes noted.  Tolerated manual therapy well.    Reviewed packet received at evaluation.  Function: Continues with L shoulder dysfunction limiting all at and above shoulder level activities.  Began MLD (manual lymphatic drainage) techniques with lotioning and skin care and range of motion exercise routine without props.    TREATMENT AND OBJECTIVE DATA SUMMARY:       Therapeutic Exercise/Procedure      Range of motion L shoulder: At time of eval:  L shoulder Flex 95, Abd 88, ER 45, IR 45     At conclusion of treatment today, L shoulder flex 100, abd 98.   Exercise program: Pt instructed to continue neural glide technique to mobilize AWS cording, scapular retraction, active shoulder ER/IR in sidelying, and lymphedema specific exercise program with  compression garments donned.       Home program: Patient to perform daily to BID skin care, deep abdominal breathing, exercise routine.   Rationale: Exercise will increase the lymph angiomotoricity and tissue pressure of the skin and thus decrease swelling.       Manual Therapy: 9:37-10:41 am 64 minutes     Area to decongest: L UE/upper quadrant   Sequence used and effectiveness: Short neck;  stim L inguinal/R axillary lnn.  Activated AAA/PAA/L AI anastomoses.  Stim vasa vasorum.  Full arm sequence.    STM pect major/minor/upper trap/subscap.  Cording techniques including stretch/counter stretch, proximal/distal gliding, gentle lifting.      Joint mob g-h distraction paired with caudal and posterior glide.  See ROM as noted above, with improvement when compared with re-eval prior treatment.                    Skin/wound care/debridement: Intact.  No redness/warmth or other s/s of infection.             Upper extremity compression: Juzo 3021 compression sleeve and RM gauntlet, while awaiting return of altered glove, for daytime wear. Solaris Tribute L UE night time compression garment, with pt advised to add outer jacket to provide added compression to garment due to elevated limb volumes.                        Kinesiotaping: na   Girth/Volume measurement: Note elevated limb volumes today, over 18% discrepancy--see scanned graph.  Anticipate mixed lymphedema and post-op edema, with improvement likely as pt continues to recover from most recent surgery involving removing L breast implant.     TOTAL TREATMENT 64 mins         ASSESSMENT:   Treatment effectiveness and tolerance: Progress has been slow regarding normalizing shoulder function, with progression of lymphedema, recent surgery with post op edema likely contributing to elevated limb volumes today.  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 Fluctuating limb volumes persist.  Re-evaluate limb volumes each visit until improvement and stabilization of measurements noted.   Progress toward goals: Improvement in L shoulder ROM noted today.  Heavy AWS cording persists.     PLAN OF CARE:   Changes to the plan of care: Resumed MLD.  Pt advised to increase duration of vasopneumatic pump use to 60 minutes if culture results from recent fluid aspiration negative for infection.  Pt to continue with day/night time compression garment wear, adding outer jacket with night time garment.  Pt to continue HEP within limits of pain.     Frequency: weekly   Other: Consider whether resuming compression bandaging indicated if limb volumes remain elevated and unstable.       Kalman Shan, PT, CLT

## 2014-05-21 NOTE — Progress Notes (Addendum)
[name]  [facility]  [address]  [address]  [place]  [address]      LYmphedema Therapy  Visit: 3            Daily note                      30 day/10th visit progress note    NAME: Emily [name]  DATE: 05/21/2014    GOALS  Short term goals  Time frame: 2-4 weeks  1. Instruct the patient to be independent with proper skin care to prevent future skin breakdown and decrease the potential risk for infections that are associated with Lymphedema.  2. Patient will be independent with a personal lymphedema exercise program to assist with the lymphatic flow and reduce L UE limb volume under 5-7% limb volume discrepancy noted. 05/21/2014 ongoing  3. Patient will understand the signs and symptoms of acute infection.  Long term goals  Time frame: 4-8 weeks  1. Patient will have knowledge of the compression options and acquire a safe and  appropriate daytime and night time compression system to prevent  re-accumulation of fluid.  2. Patient or family will be able to don/doff garments independently. Garment  system effectiveness will be evaluated prior to discharge for better long-term  management and outcomes.  3. Improvement in LLIS by 4-8 points with improvement in lymphedema, transitioning to home management phase of treatment.  4. L shoulder ROM flex/abd improved by 10-20 degrees with 75% less pain at end range.   5. Pt independent in self MLD.  6. To transition patient to independent restorative phase of CDT.       SUBJECTIVE REPORT:  Pt returns to treatment with compression sleeve and gauntlet donned.  Pt states per [name], she continued use of vasopneumatic pump at home 60 minutes each day without c/o.  Pt states she continues day/night time compression garment wear with outer jacket over Tribute, and nightly skin care.  Pt awaiting compression glove to arrive  after alterations requested.  "I feel better," pt states regarding removal of L breast implant.  States she felt tired, and "like I had the flu" with those symptoms much improved since implant removed.     Pain: 6-8/10 end range shoulder flex/abd.             Gait:  indep  ADLs:  indep  Treatment Response:  Elevated limb volumes noted.  Tolerated manual therapy well.    Reviewed packet received at evaluation.  Function: Continues with L shoulder dysfunction limiting all at and above shoulder level activities.  Began MLD (manual lymphatic drainage) techniques with lotioning and skin care and range of motion exercise routine without props.    TREATMENT AND OBJECTIVE DATA SUMMARY:       Therapeutic Exercise/Procedure      Range of motion L shoulder: At time of eval:  L shoulder Flex 95, Abd 88, ER 45, IR 45     At conclusion of treatment today, L shoulder flex 110, abd 100.   Exercise program: Pt instructed to continue neural glide technique to mobilize AWS cording, scapular retraction, active shoulder ER/IR in sidelying, and lymphedema specific exercise program with compression garments donned.  Home program: Patient to perform daily to BID skin care, deep abdominal breathing, exercise routine.   Rationale: Exercise will increase the lymph angiomotoricity and tissue pressure of the skin and thus decrease swelling.       Manual Therapy: 11:02 am-12:00 pm 58 minutes     Area to decongest: L UE/upper quadrant   Sequence used and effectiveness: Short neck;  stim L inguinal/R axillary lnn.  Activated AAA/PAA/L AI anastomoses. Upper quadrant sequence including stim perforating precollectors, with additional time spent on trunk sequence. Stim vasa vasorum.  Full arm sequence. Fibrosis technique forearm    STM pect major/minor/upper trap/subscap.  Cording techniques including proximal/distal gliding.                    Skin/wound care/debridement: Intact.  No redness/warmth or other s/s of infection.              Upper extremity compression: Juzo 3021 compression sleeve and RM gauntlet, while awaiting return of altered glove, for daytime wear. [employer]is Tribute L UE night time compression garment, with pt advised to continue to wear outer jacket with Tribute to provide added compression to garment due to elevated limb volumes.    Komprex II rectangular pad volar aspect of forearm under compression sleeve, with pt advised to wear every other day, soft side of pad against skin, removing and any s/s of skin irritation or discomfort.                      Kinesiotaping: na   Girth/Volume measurement: Note elevated limb volumes today, over 14% discrepancy--see scanned graph. This is improved with reduced L LE volume by 59.76 mL noted since limb volume measurements take last appt.  Recommend reassessing with each visit.     TOTAL TREATMENT 58 mins         ASSESSMENT:   Treatment effectiveness and tolerance: Progress has been slow regarding normalizing shoulder function, with progression of lymphedema, recent surgery with post op edema likely contributing to elevated limb volumes today.  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 Fluctuating limb volumes persist, with improvement noted today since recent implant removal.  Re-evaluate limb volumes each visit until improvement and stabilization of measurements noted.   Progress toward goals: Improvement in L shoulder ROM noted today.  Heavy AWS cording persists.     PLAN OF CARE:   Changes to the plan of care: Continued MLD.  Pt advised to continue duration of vasopneumatic pump use to 60 minutes with cultures reported by pt to be negative for infection.  Pt to continue with day/night time compression garment wear, including outer jacket with night time garment.  Pt to continue HEP within limits of pain.     Frequency: weekly    Other: Consider whether resuming compression bandaging indicated if limb volumes remain elevated and unstable.       Kalman Shan, PT, CLT

## 2014-05-27 MED ORDER — DIAZEPAM 5 MG TAB
5 mg | ORAL_TABLET | Freq: Four times a day (QID) | ORAL | Status: DC | PRN
Start: 2014-05-27 — End: 2014-06-25

## 2014-05-27 NOTE — Telephone Encounter (Signed)
Received request from [facility] for refill of patient's diazepam 5 mg.  Called in diazepam 5 mg #30 with no refills to [facility] in [place] at [phone] per order of Dr. Drucilla Schmidt.

## 2014-05-28 NOTE — Progress Notes (Signed)
[name]  [facility]  [address]  [address]  [place]  [address]      LYmphedema Therapy  Visit: 4            Daily note                      30 day/10th visit progress note    NAME: Emily [name]  DATE: 05/28/2014    GOALS  Short term goals  Time frame: 2-4 weeks  1. Instruct the patient to be independent with proper skin care to prevent future skin breakdown and decrease the potential risk for infections that are associated with Lymphedema.  2. Patient will be independent with a personal lymphedema exercise program to assist with the lymphatic flow and reduce L UE limb volume under 5-7% limb volume discrepancy noted. 05/21/2014 ongoing 05/28/2014 ongoing  3. Patient will understand the signs and symptoms of acute infection.  Long term goals  Time frame: 4-8 weeks  1. Patient will have knowledge of the compression options and acquire a safe and  appropriate daytime and night time compression system to prevent  re-accumulation of fluid.  05/28/2014 daytime compression sleeve/glove fit with initial success.  Night time compression garment fit successfully.  2. Patient or family will be able to don/doff garments independently. Garment  system effectiveness will be evaluated prior to discharge for better long-term  management and outcomes. 05/28/2014 Pt able to don/doff garments indep at this time.  3. Improvement in LLIS by 4-8 points with improvement in lymphedema, transitioning to home management phase of treatment.  4. L shoulder ROM flex/abd improved by 10-20 degrees with 75% less pain at end range.   5. Pt independent in self MLD.  6. To transition patient to independent restorative phase of CDT.       SUBJECTIVE REPORT:  Pt arrives to treatment today with replacement custom flat knit compression glove, which is noted to be incorrect in color.  Pt  states she just completed appt with Dr. Verl Blalock, with plan to have reconstructive surgery tomorrow at 7 am.  Pt states she is anxious about procedure tomorrow, however, states, "it has to be done now, or it will be too late."   Pt arrives late for appt today secondary to earlier MD appt, and states she needs to leave this appt to complete pre-op registration for tomorrow's procedure.  Priority of treatment today per pt is fit of compression garments, and measurements being completed for second set of garments.     Pain: 6-8/10             Gait:  indep  ADLs:  indep  Treatment Response:  Limb volume measurements stable, remaining in 15% discrepancy range.  Measurements completed for second set of daytime compression garments.  Reviewed packet received at evaluation.  Function: Continues with L shoulder dysfunction limiting all at and above shoulder level activities.  Began MLD (manual lymphatic drainage) techniques with lotioning and skin care and range of motion exercise routine without props.    TREATMENT AND OBJECTIVE DATA SUMMARY:       Therapeutic Exercise/Procedure      Range of motion L shoulder: At time of eval:  L shoulder Flex 95,  Abd 88, ER 45, IR 45     Not assessed today.   Exercise program: Pt instructed to continue neural glide technique to mobilize AWS cording, scapular retraction, active shoulder ER/IR in sidelying, and lymphedema specific exercise program with compression garments donned.       Home program: Patient to perform daily to BID skin care, deep abdominal breathing, exercise routine.   Rationale: Exercise will increase the lymph angiomotoricity and tissue pressure of the skin and thus decrease swelling.       Manual Therapy: 11:15 am-12:00 pm 45 minutes     Area to decongest: L UE/upper quadrant   Sequence used and effectiveness: Deferred MLD.                    Skin/wound care/debridement: Intact.  No redness/warmth or other s/s of infection.              Upper extremity compression: Manual therapy Juzo 3021 compression glove, which was remade at request of therapist, received by pt.  Note manufacturer's mistake regarding color, with gray glove ordered, and beige glove sent.  Note good initial fit of garment for current wear, however, completed new glove measurements, as current glove slightly loose at thumb and hand.  Zipper placement is appropriate and is a great asset for pt for donning purposes.  [employer] representative contacted who will handle replacement of this glove due to color discrepancy.    Measurements completed for second custom flat knit compression sleeve/glove, black in color, with order sent to [employer].      Komprex II rectangular provided prior visit to be worn under compression sleeve at forearm in area of fullness/fibrosis, with pt advised to continue wear every other day, soft side of pad against skin, removing and any s/s of skin irritation or discomfort.                      Kinesiotaping: na   Girth/Volume measurement: Note elevated limb volumes today, over 15% discrepancy--see scanned graph. Recommend reassessing with each visit.     TOTAL TREATMENT 46 mins         ASSESSMENT:   Treatment effectiveness and tolerance: Progress has been slow regarding normalizing shoulder function, with progression of lymphedema, recent surgery with post op edema likely contributing to elevated limb volumes today. Pt has surgery scheduled again for tomorrow, with 3 week follow up appt for assessment of lymphedema, shoulder dysfunction, and compression garment fit as indicated to be completed at that time. 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 Fluctuating limb volumes persist, with stable measurements noted today since recent implant removal.  Re-evaluate limb volumes each visit until improvement and stabilization of measurements noted.    Progress toward goals: See updated goals.     PLAN OF CARE:   Changes to the plan of care: Pt to resume compression garment wear, vasopneumatic pump use, and lymphedema specific exercise program post-op as recommended by Dr. Verl Blalock.  Pt to return for reassessment of lymphedema/shoulder dysfunction/garment fit in 3 weeks.   Frequency: weekly   Other: Consider whether resuming compression bandaging indicated if limb volumes remain elevated and unstable.       Kalman Shan, PT, CLT

## 2014-06-24 NOTE — Telephone Encounter (Signed)
Pt returned call. She shared that she has been busy with recent surgeries for implant repairs. Appt made for Dec 17 at 1pm. No other needs, concerns at this time.

## 2014-06-24 NOTE — Telephone Encounter (Signed)
Called pt to check in with her and to discuss her follow up appt with [name]. Pt needs to be scheduled on a Thurs or Fri; will try and coordinate that around MD and pt schedules. No answer; left message for pt to return call.

## 2014-06-25 MED ORDER — DIAZEPAM 5 MG TAB
5 mg | ORAL_TABLET | Freq: Four times a day (QID) | ORAL | Status: DC | PRN
Start: 2014-06-25 — End: 2014-08-30

## 2014-06-25 NOTE — Telephone Encounter (Signed)
Received request for refill of patient's diazepam 5 mg #30.  Called [facility] at [phone] and authorized #30 with two refills per verbal order of Dr. Drucilla Schmidt.

## 2014-06-27 NOTE — Progress Notes (Signed)
Emily [name]  [address]  [place]  [address]      Lymphedema Therapy      06/27/2014:  Emily [name] was not seen on this date for physical therapy for the following reasons:             Patient called to cancel the visit for the following reasons: Illness           Patient missed the visit and did not call to cancel.    Kalman Shan, PT, CLT

## 2014-07-07 MED ORDER — BUTALBITAL-ACETAMINOPHEN-CAFFEINE 50 MG-325 MG-40 MG TAB
50-325-40 mg | ORAL_TABLET | ORAL | Status: DC | PRN
Start: 2014-07-07 — End: 2015-07-24

## 2014-07-07 NOTE — Telephone Encounter (Signed)
Requested Prescriptions     Signed Prescriptions Disp Refills   ??? butalbital-acetaminophen-caffeine (FIORICET, ESGIC) 50-325-40 mg per tablet 30 Tab 0     Sig: Take 1 Tab by mouth every four (4) hours as needed for Headache. Max Daily Amount: 6 Tabs.     Authorizing Provider: Forbes Cellar     Ordering User: Milus Height G    ok per Denny Peon, NP. Called patient to pick up.

## 2014-07-07 NOTE — Telephone Encounter (Signed)
Patient stated she is having bad headaches and usually takes Fioricet with codeine that her neurologist prescribes but they are out of town for 2 weeks and they wanted her to call her Oncologist. She said she is available tomorrow to pick up the prescription. Call back number [phone]

## 2014-07-14 ENCOUNTER — Inpatient Hospital Stay: Admit: 2014-07-14 | Payer: BLUE CROSS/BLUE SHIELD | Primary: Family Medicine

## 2014-07-14 DIAGNOSIS — I890 Lymphedema, not elsewhere classified: Secondary | ICD-10-CM

## 2014-07-14 NOTE — Progress Notes (Signed)
[address]  [address]  [place]  [address]      EVALUATION    NAME: Emily [name] AGE: 63 y.o.  GENDER: female  DATE: 07/14/2014  REFERRING PHYSICIAN: Thomes Dinning, MD; CC: Michaele Offer, MD   HISTORY AND BACKGROUND:  Pt is a 63 y/o female found to have 2.6 cm LN L axilla 09/24/2012.  Underwent biopsy on 10/02/2012 with findings of metastatic neoplasm.  MRI on 10/05/2012 revealed L breast mass in addition to lymph node.  Biopsy of breast mass on 10/16/2012 showed poorly differentiated carcinoma.  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  Pt completed chemotherapy and XRT.  Pt received care to address L UE lymphedema at [facility], prior to coming to this clinic. Pt presented at initial eval with pain L axilla, reporting "fluid pocket" in region, severe pain with shoulder movement, and feeling of "fullness" L upper arm. Pt stated symptoms presented post surgery.  Pt reports bilat shoulder dysfunction prior to cancer diagnosis, having undergone R rotator cuff repair 7 years ago, L rotator cuff repair 3 years ago. Pt is s/p bilateral breast reconstruction 09/16/2013.  States additional reconstructive surgery scheduled for 01/30/2014.      Pt returns today s/p reconstruction bilateral breasts, reduction with implant placement R breast, small implant placement with fat grafting L breast.  Pt states L implant was tenuous, and she has refrained from use of vasopneumatic pump to address lymphedema per Dr. Benjaman Lobe order.  Pt reports she has been compliant with day/night time compression garment wear L UE, as instructed.  Pt expresses that she is content with results of recent reconstructive surgery.   Primary Diagnosis:   ?? 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 Malignant neoplasm of breast (174.0)  ?? Pain in joint (719.41)  Date of Onset: 11/29/2011  Present Symptoms and Functional Limitations: Pain L axilla/upper arm/upper quadrant anterior/lateral aspect.  L UE lymphedema.  LLIS lymphedema outcomes measure:  67/90; 74% impairment.  Past Medical History:   Past Medical History   Diagnosis Date   ??? Cancer ([place])      left breast  cancer   ??? Anxiety    ??? Arthritis    ??? Nausea & vomiting    ??? Concussion 2011   ??? Chronic tension headaches    ??? Headache(784.0)      OCCAS   ??? Other ill-defined conditions(799.89)      PHEOCHROMOCYTOMA   ??? Other and unspecified symptoms and signs involving general sensations and perceptions      HIVES RECENTLY- WAS IN ER Feb 19, 2013   ??? Lymphedema of arm      left     Past Surgical History   Procedure Laterality Date   ??? Hx orthopaedic       bilateral shoulder arthroscopy   ??? Hx modified radical mastectomy  11/28/2012     LEFT BREAST MODIFIED RADICAL MASTECTOMY WITH AXILLARY DISSECTION W/ RECONSTRUCTION LEFT BREAST WITH TISSUE EXPANDER AND ALLODERM;PORTACATH INSERTION performed by Ty Hilts, MD at [facility]   ??? Hx gyn       c-section   ??? Hx oophorectomy       right   ???  Hx heent       T&A   ??? Hx heent       right adrenal removed   ??? Pr abdomen surgery proc unlisted       hernia mesh repair   ??? Hx appendectomy     ??? Pr abdomen surgery proc unlisted       right adenal removed   ??? Hx vascular access       PORT, THEN REMOVED   ??? Pr breast surgery procedure unlisted  '91     breast implants; replaced bilat implants 2011   ??? Hx breast reconstruction  11/28/2012     BREAST RECONSTRUCTION /C  INSERTION EXPANDER & ALLODERM performed by Sharlotte Alamo, MD at [facility]   ??? Hx mastectomy Left    ??? Hx breast reconstruction Bilateral 09/16/2013     REMOVAL AND REPLACEMENT LEFT BREAST IMPLANT TISSUE EXPANDERS WITH  SUBTOTAL CAPSULECTOMY, RIGHT BREAST IMPLANT REMOVAL AND REPLACEMENT FOR SYMMETRY/FAT GRAFTING TO BILATERAL BREASTS performed by Sharlotte Alamo, MD at [facility]   ??? Hx breast augmentation Left 04/22/2014     REMOVAL OF LEFT BREAST IMPLANT, WASHOUT AND CLOSURE OF LEFT BREAST WOUND performed by Sharlotte Alamo, MD at [facility] MAIN OR     Current Medications:    Current Outpatient Prescriptions   Medication Sig   ??? butalbital-acetaminophen-caffeine (FIORICET, ESGIC) 50-325-40 mg per tablet Take 1 Tab by mouth every four (4) hours as needed for Headache. Max Daily Amount: 6 Tabs.   ??? diazepam (VALIUM) 5 mg tablet Take 1 Tab by mouth every six (6) hours as needed for Anxiety (muscle spasm) for up to 30 doses. Max Daily Amount: 20 mg.   ??? oxyCODONE-acetaminophen (PERCOCET) 5-325 mg per tablet Take 2 Tabs by mouth every six (6) hours as needed for Pain. Max Daily Amount: 8 Tabs.   ??? escitalopram oxalate (LEXAPRO) 20 mg tablet TAKE 1 TABLET BY MOUTH DAILY.   ??? letrozole (FEMARA) 2.5 mg tablet Take 1 Tab by mouth daily.   ??? spironolactone (ALDACTONE) 25 mg tablet Take 1 Tab by mouth daily.   ??? oxyCODONE-acetaminophen (PERCOCET) 5-325 mg per tablet Take 2 tablets by mouth every four (4) hours as needed.     No current facility-administered medications for this encounter.     Allergies:   Allergies   Allergen Reactions   ??? Aspirin Unknown (comments)     Gi bleeding   ??? Compazine [Prochlorperazine] Rash   ??? Keflex [Cephalexin] Rash   ??? Levaquin [Levofloxacin] Hives     DENIES ALLERGY   ??? Nsaids (Non-Steroidal Anti-Inflammatory Drug) Swelling     Throat swells   ??? Prednisone Rash     DENIES ALLERGY      Social/Work History and Prior Level of Function:  Pt reports continuing intermittent work schedule.  Pt lives in [place].  Pt has a 61 year old son which she and her partner care for.   Living Situation: Pt lives near her work place, which is a good distance from this clinic, probably an hour one way.  Lives with boyfriend and  young son.     Trainable Caregiver?: Pt's significant other is an Conservation officer, historic buildings.  Available to assist pt as needed.    Self-care/ADLs: performing mod I, additional time, UE dressing deliberately to avoid pain L UE.     Mobility: indep transfers, bed mobility, gait in clinic.   Sleeping Arrangement:  Bed.   Adaptive Equipment Owned: na  Previous Therapy:  Pt received therapy for lymphedema management at [facility] prior to coming to this clinic. 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  Pt experienced progression of stage 0 lymphedema to stage II lymphedema L UE end of May, with increase in limb volume beginning of June 2015.  Phase I CDT initiated at that time including application of compression bandaging. Custom flat knit compression garments for daytime use and Solaris Tribute for night time use were fit upon discontinuing compression bandaging.  Compression/Lymphedema Equipment:  Custom flat knit compression sleeve and RM gauntlet while awaiting re-make of custom flat knit compression glove; Solaris Tribute arm sleeve; Lymphapress Comfy sleeve pump.    SUBJECTIVE:   Pt arrives today with ready made daytime compression garments in place stating, "I did not want to wear of custom garments today.  I just felt like wearing pink."   Pt states she is content with result of most recent reconstructive surgery performed by Dr. Verl Blalock.  States she has been compliant with post surgery precautions as instructed by Dr. Verl Blalock, including no use of vasopneumatic pump.  Pt reports L breast implant "tenuous" regarding "whether it was going to make it or not."  States she has a follow up appt with Dr. Verl Blalock in November. Pt reports most problematic region of lymphedema being L forearm.  States she continues taking "half of a valium to sleep at night."   Patient???s goals for therapy: Relieve pain with L shoulder function;  Decrease L UE swelling.    EVALUATION AND OBJECTIVE DATA SUMMARY:   Pain: L lateral upper quadrant/lat region/axilla/pect 8/10 end range shoulder flex/abd; 8/10 at rest with shoulder in neutral position, primarily at night.            Skin and Tissue Assessment:  Dermal Status:  ( x)  Intact: L UE ( )  Dry   ( x)  Tenuous: Small scab mid breast L ( )  Flaky   ( )  Wound/lesion present ( x)  Scars:  Incision L breast.   ( )  Dermatitis    Texture/Consistency:  (x )  Boggy:  Forearm most problematic area at this time. ( )  Pitting Edema   ( )  Brawny (x )  Implants: bilateral breast   ( x)  Fibrotic/Woody:  Forearm/elbow/distal upper arm.    Pigmentation/Color Change:  ( )  Normal:   ( )  Hemosiderin   ( )  Red:  ( )  Erythematous   (x )  Hyperpigmented:  Mild L UE; L breast. ( )  Hyperlipodermatosclerosis   Anomalies:    (x )  Axillary web syndrome:  Note significant palpable cord noted L axilla, with radiating symptoms into upper arm with shoulder abduction/flexion.   ( )  Vesicles   ( )  Petechiae ( )  Warty Vercusis   ( )  Bullae ( )  Papilloma   Circulatory:  ( )  ABI ( )  Varicosities:   ( x)  Pulses: Palpable radial. ( )  Vascular studies ruled out DVT in past   ( )  DVT History    Nails:  (x )  Normal  ( )  Fungus  Stemmers Sign: negative  (36 or greater: adversely affecting lymphedema)  Volumetric Measurements:   Right: 1653.53 mL Left:  1925.38 mL   % Difference: 16.44% Dominance: Right   (See scanned graph)  Range of Motion: R shoulder flex 92,  abd 90, ER 35, IR 60, with elbow, wrist, hand WFL.  L shoulder Flex 88, Abd 88, ER 75, IR 45.  Pt unable to place hand behind head, behind back. All shoulder movement guarded bilaterally, L>R.  Strength: Shoulder strength bilat grossly 4-/5 within available ROM, limited by pain/weakness. Tricep 3+/5, bicep 4-5, wrist, hand R 4/5, L 4/5.  Sensation:  Pt c/o numbness upper arm, lateral upper quadrant.   Palpation:  Note significant axillary web syndrome, palpable cording noted axilla. Note trigger points and muscular tension increased pect major/minor, lat, subscapularis, upper trap, rhomboid.  Posture: Note forward head/shoulder posture noted.  Mobility:  Bed/Chair Mobility:  indep Transfers:  indep   Sitting Balance:  good Standing Balance:  good   Gait:  indep Wheelchair Mobility:  na   Endurance:  Appears intact for current level of activity, including gait community distances, working abbreviated schedule.  Pt currently on lifting restrictions s/p surgery.  Stairs:  Not assessed.       Safety:  Patient is alert and oriented:  x4   Safety awareness:  intact   Fall Risk?:  Low, use of pain meds evening hours.      Precautions:  Standard lymphedema precautions to include avoiding blood pressure readings, injections and IVs or other procedures/acts that could lead to broken skin on affected area, and avoiding excessive heat, resistive activity or altitude without compression garment    Evaluation Time:12:30-12:55 pm 25 minutes    TREATMENT PROVIDED:   1.  Treatment description:  Gentle conservative STM performed sub scapularis, pect major/minor, with multiple trigger points noted in musculature. Gentle ROM L shoulder flex/abd limited to under 90 degrees within limits of pain. Cording techniques performed including perpendicular mobilization and stretch/counter stretch of cording length of palpable involvement.  Pt advised that resolving axillary web syndrome is promoted pairing shoulder flex/abd with elbow/wrist extension, and that limited improvement of cording likely due to multiple surgeries, and limited ROM L shoulder, which was present prior to breast cancer diagnosis and treatment.  Pt instructed to remain conservative with ROM and UE activity as advised by Dr. Verl Blalock at this time. Pt failed to bring custom flat knit compression garments to clinic to allow therapist assess  fit.  Measurements completed for replacement custom flat knit compression sleeve and glove, allowing pt to have one set of garments to wear and one to wash, as garments need to be laundered and dried nightly. Custom garments are medically necessary for pt at this time, noting limb volume discrepancy significant at over 16% with consistent day/night time compression garment wear.  Pt instructed to continue day/night time wear of compression garments as instructed, following instructions:  Compression garment routine:  1.  Apply daytime compression stocking to clean, dry extremity first thing in the morning, EVERY MORNING.    2. Wear garment until bedtime, adjusting throughout the day as needed should garment wrinkle or crease.  Problem areas can be at joint, and top of garment, ensuring proximal aspect of garment is positioned appropriately on extremity.  3. Launder compression garments nightly either by hand or washing machine in a garment bag or pillowcase.  Use mild detergent with NO FABRIC SOFTENER, NO BLEACH, NO WOOLITE.  Wash in cool/warm water.    4. Dry garment in dryer on low heat right side out in garment bag or pillowcase.  5. Daytime compression garments are NOT safe to sleep in, so remove at bedtime.    6. Perform skin care to extremity, cleansing skin  daily with soap and water, and using low pH lotion, upward strokes to stimulate lymphatic vessels.   7. Apply night time compression garment as instructed to wear while sleeping, adjusting throughout the night as needed for comfort.  8. Perform exercise program with daytime compression stockings on.  Signs/Symptoms of infection include:  Fever, flu-like symptoms, pain/redness/warmth in extremity, sometimes with increase in swelling present.  Stop wearing your compression garment if this occurs.  Call your doctor immediately or go to the Emergency room to address potential infection.  Remove compression with changes in circulation or numbness in extremity,  different from what you may experience when not wearing compression garment, pain, unexplained shortness of breath.    Treatment time:  12:57-1:44 pm  48 minutes     Manual therapy 3         ASSESSMENT:   MORGIN HALLS is a 63 y.o. 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  Patient's has undergone multiple reconstructive procedures, with recent placement of implant L breast and revision R breast 05/2014.  Pt has remained compliant with day/night time compression garment wear, however, has not resumed use of vasopneumatic pump per Dr. Benjaman Lobe instruction s/p reconstruction.  Pt continues with bilat shoulder dysfunction, orthopedic in nature, which was present prior to breast cancer diagnosis and treatment.  Limited available shoulder ROM likely limiting progress with resolution of AWS. Question neuropathic pain in conjunction with pain and limitations currently present with AWS.  L UE lymphedema, breast cancer related, continues to be a concern, with limb volume discrepancy over 16 % at this time, L UE>R UE.  Home management of lymphedema likely to improve when she is cleared by surgeon to resume daily use of vasopneumatic pump, however, pt advised to proceed only as instructed by Dr. Verl Blalock at this time, with preservation of L breast implant a priority.  Upon Dr. Verl Blalock clearing pt from post surgery precautions, patient would benefit from 2-3x/week visits for physical therapy to address shoulder dysfunction, with patient seeking care closer to home potentially required, allowing pt to be seen at frequency needed, promoting improved progress.  Patient will return to clinic for fit of replacement compression garments and for lymphedema management.  Awaiting custom flat knit compression garments, replacement  for fit.      This care is medically necessary due to the infection risk with lymphedema, and to improve functional activities.  CDT is necessary to resolve swelling to allow patient to return to wearing normal clothes/footwear, and prevent worsening of symptoms, such as venous stasis ulcerations, infections, or hospitalizations.  Patient will be independent with home program strategies to allow improved ADL ability and mobility and to allow patient to return to greatest functional independence.    Rehabilitation potential is considered to be Good for goals.  Factors which may influence rehabilitation potential include significant pain per pt report continues as noted previous visits at this time possibly limiting treatment, as well as prior shoulder dysfunction still present, however, pt motivated to improve, progression of lymphedema L UE.  Patient lives over an hour from this clinic, is employed, and has a young son, which has limited compliance with attendance at appointments, understandably so.  Discussed with pt that she may benefit from more frequent PT visits in orthopedic setting closer to home upon successful management of lymphedema to allow  for progress with shoulder dysfunction.  Pt verbalizes understanding of recommendations and will consider her treatment options.      PLAN OF CARE:   Recommendations and Planned Interventions:  Manual lymph drainage/complete decongestive therapy  Joint mobilization/manual therapy  Compression garment fitting/provision  Lymphedema therapeutic exercise  AROM/PROM/Strength/Coordination  Self-care training  Education in skin care and lymphedema precautions  Self-MLD education per home program  Self-bandaging education per home program  Caregiver education as needed  Vasopneumatic pump for home use recommended by surgeon     GOALS    Time frame: 4-8 weeks  1. Instruct the patient to be independent with proper skin care to prevent  future skin breakdown and decrease the potential risk for infections that are associated with Lymphedema.  2. Patient will be independent with a personal lymphedema exercise program to assist with the lymphatic flow and reduce L UE limb volume under 12%  limb volume discrepancy noted.  3. Patient will understand the signs and symptoms of acute infection.  4.   Patient will have knowledge of the compression options and acquire a safe and   appropriate daytime and night time compression system to prevent    re-accumulation of fluid.  5.  Patient or family will be able to don/doff garments independently.  Garment   system effectiveness will be evaluated prior to discharge for better long-term   management and outcomes.  6.  Improvement in LLIS by 4-8 points with improvement in lymphedema, transitioning to home management phase of treatment.  7.   To transition patient to independent restorative phase of CDT.           Patient has participated in goal setting and agrees to work toward plan of care.  Patient was instructed to call if questions or concerns arise.    Thank you for this referral.  Kalman Shan, PT, CLT    Time Calculation: 64 mins    TREATMENT PLAN EFFECTIVE DATES:   07/14/2014 to 12/331/2015  I have read the above plan of care for [facility].  I certify the above prescribed services are required by this patient and are medically necessary.  The above plan of care has been developed in conjunction with the lymphedema/physical therapist.       Physician Signature: ____________________________________Date:______________

## 2014-07-28 ENCOUNTER — Ambulatory Visit
Admit: 2014-07-28 | Discharge: 2014-07-28 | Payer: PRIVATE HEALTH INSURANCE | Attending: Surgery | Primary: Family Medicine

## 2014-07-28 DIAGNOSIS — C50912 Malignant neoplasm of unspecified site of left female breast: Secondary | ICD-10-CM

## 2014-07-28 NOTE — Progress Notes (Signed)
HISTORY OF PRESENT ILLNESS  TIWATOPE EMMITT is a 63 y.o. female.  HPI Established patient here today for follow-up history of LEFT breast cancer.  Patient is having muscles issues in her LEFT arm from the cording.  S/P LEFT mastectomy with reconstruction and then implant was removed in 04/2014.    ROS    Physical Exam   Cardiovascular: Normal rate and normal heart sounds.    Pulmonary/Chest: Breath sounds normal. Right breast exhibits no inverted nipple, no mass, no nipple discharge, no skin change and no tenderness. Left breast exhibits no inverted nipple, no mass, no nipple discharge, no skin change and no tenderness. Breasts are symmetrical.       Lymphadenopathy:        Right cervical: No superficial cervical, no deep cervical and no posterior cervical adenopathy present.       Left cervical: No superficial cervical, no deep cervical and no posterior cervical adenopathy present.        Right axillary: No pectoral and no lateral adenopathy present.        Left axillary: No pectoral and no lateral adenopathy present.      ASSESSMENT and PLAN  Encounter Diagnoses   Name Primary?   ??? Breast cancer, stage 2, left ([place]) Yes     Doing better, had implant placed on left, small eschar central part.  Will continue current course.  Thinking about holding hormonal therapy for quality of life issues.  Will see Ida in 6 months to talk through some options.

## 2014-07-28 NOTE — Communication Body (Signed)
HISTORY OF PRESENT ILLNESS  Emily [name] is a 63 y.o. female.  HPI Established patient here today for follow-up history of LEFT breast cancer.  Patient is having muscles issues in her LEFT arm from the cording.  S/P LEFT mastectomy with reconstruction and then implant was removed in 04/2014.    ROS    Physical Exam   Cardiovascular: Normal rate and normal heart sounds.    Pulmonary/Chest: Breath sounds normal. Right breast exhibits no inverted nipple, no mass, no nipple discharge, no skin change and no tenderness. Left breast exhibits no inverted nipple, no mass, no nipple discharge, no skin change and no tenderness. Breasts are symmetrical.       Lymphadenopathy:        Right cervical: No superficial cervical, no deep cervical and no posterior cervical adenopathy present.       Left cervical: No superficial cervical, no deep cervical and no posterior cervical adenopathy present.        Right axillary: No pectoral and no lateral adenopathy present.        Left axillary: No pectoral and no lateral adenopathy present.      ASSESSMENT and PLAN  Encounter Diagnoses   Name Primary?   ??? Breast cancer, stage 2, left ([place]) Yes     Doing better, had implant placed on left, small eschar central part.  Will continue current course.  Thinking about holding hormonal therapy for quality of life issues.  Will see Emily [name] in 6 months to talk through some options.

## 2014-07-30 NOTE — Patient Instructions (Signed)
Learning About Physical Activity  What is physical activity?  Physical activity is any kind of activity that gets your body moving. Being active means allowing your body to "practice" breathing, stretching, and lifting. The more practice your body gets, the better it works.  The types of physical activity that can help you get fit and stay healthy include:  ?? Aerobic or "cardio" activities that make your heart beat faster and make you breathe harder, such as brisk walking, riding a bike, or running. Aerobic activities strengthen your heart and lungs and build up your endurance.  ?? Strength training activities that make your muscles work against, or "resist," something, such as lifting weights or doing push-ups. These activities help tone and strengthen your muscles.  ?? Stretches that allow you to move your joints and muscles through their full range of motion. Stretching helps you be more flexible and avoid injury.  What are the benefits of physical activity?  Being active is one of the best things you can do to get fit and stay healthy. It helps you to:  ?? Feel stronger and have more energy to do all the things you like to do.  ?? Focus better at school or work and perform better in sports.  ?? Feel, think, and sleep better.  ?? Reach and stay at a healthy weight.  ?? Lose fat and build lean muscle.  ?? Lower your risk for serious health problems.  ?? Keep your bones, muscles, and joints strong.  Being fit lets you do more physical activity. And it lets you work out harder without as much effort.  How can you make physical activity part of your life?  Get at least 30 minutes of exercise on most days of the week. Walking is a good choice. You also may want to do other activities, such as running, swimming, cycling, or playing tennis or team sports.  Pick activities that you like???ones that make your heart beat faster, your muscles stronger, and your muscles and joints more flexible. If you find  more than one thing you like doing, do them all. You don't have to do the same thing every day.  Get your heart pumping every day. Any activity that makes your heart beat faster and keeps it at that rate for a while counts.  Here are some great ways to get your heart beating faster:  ?? Go for a brisk walk, run, or bike ride.  ?? Go for a hike or swim.  ?? Go in-line skating.  ?? Play a game of touch football, basketball, or soccer.  ?? Ride a bike.  ?? Play tennis or racquetball.  ?? Climb stairs.  Even some household chores can be aerobic???just do them at a faster pace. Vacuuming, raking or mowing the lawn, sweeping the garage, and washing and waxing the car all can help get your heart rate up.  Strengthen your muscles during the week. You don't have to lift heavy weights or grow big, bulky muscles to get stronger. Doing a few simple activities that make your muscles work against, or "resist," something can help you get stronger.  For example, you can:  ?? Do push-ups or sit-ups, which use your own body weight as resistance.  ?? Lift weights or dumbbells or use stretch bands at home or in a gym or community center.  Stretch your muscles often. Stretching will help you as you become more active. It can help you stay flexible, loosen tight muscles, and avoid injury.   It can also help improve your balance and posture and can be a great way to relax.  Be sure to stretch the muscles you'll be using when you work out. It???s best to warm your muscles slightly before you stretch them. Walk or do some other light aerobic activity for a few minutes, and then start stretching.  When you stretch your muscles:  ?? Do it slowly. Stretching is not about going fast or making sudden movements.  ?? Don't push or bounce during a stretch.  ?? Hold each stretch for at least 15 to 30 seconds, if you can. You should feel a stretch in the muscle, but not pain.  ?? Breathe out as you do the stretch. Then breathe in as you hold the  stretch. Don't hold your breath.  If you're worried about how more activity might affect your health, have a checkup before you start. Follow any special advice your doctor gives you for getting a smart start.   Where can you learn more?   Go to http://www.healthwise.net/[facility]  Enter W332 in the search box to learn more about "Learning About Physical Activity."   ?? 2006-2015 Healthwise, Incorporated. Care instructions adapted under license by [place] (which disclaims liability or warranty for this information). This care instruction is for use with your licensed healthcare professional. If you have questions about a medical condition or this instruction, always ask your healthcare professional. Healthwise, Incorporated disclaims any warranty or liability for your use of this information.  Content Version: 10.5.422740; Current as of: August 16, 2013

## 2014-07-31 ENCOUNTER — Encounter: Primary: Family Medicine

## 2014-08-04 MED ORDER — SERTRALINE 100 MG TAB
100 mg | ORAL_TABLET | Freq: Every day | ORAL | Status: DC
Start: 2014-08-04 — End: 2014-11-05

## 2014-08-04 MED ORDER — ESCITALOPRAM 20 MG TAB
20 mg | ORAL_TABLET | ORAL | Status: DC
Start: 2014-08-04 — End: 2014-08-04

## 2014-08-04 NOTE — Telephone Encounter (Signed)
Called and spoke to patient and informed her that per Denny Peon, NP, the maximum daily dose for Lexapro is 20 mg.  Patient states she feels as though the Lexapro is no longer working and was inquiring if there was an alternative drug she could try.  Advised patient that per Denny Peon, NP, to stop taking the Lexapro and prescription for Zoloft would be sent to pharmacy for her to begin taking.  Advised patient to call office with any problems.  Patient voices understanding and denies any further questions at this time.  Per verbal order from Denny Peon, NP, orders received for the following:  Requested Prescriptions     Signed Prescriptions Disp Refills   ??? sertraline (ZOLOFT) 100 mg tablet 30 Tab 3     Sig: Take 1 Tab by mouth daily.     Authorizing Provider: Forbes Cellar     Ordering User: Reynaldo Minium, Mkenzie Dotts J

## 2014-08-04 NOTE — Telephone Encounter (Signed)
Patient called and left a vm stating that pharmacy will be contacting us to refill this med. She requested it be increased from 20mg  to 30mg  and stated that her and [name] discussed this increase at her last appointment. [id number]#[phone]. Thank You.

## 2014-08-07 NOTE — Telephone Encounter (Signed)
Call received from patient, who states that she had seen her GYN, Dr. Melina Modena, and was told about a drug named Lacretia Nicks that is supposed to help with blood flow and lymphedema.  Patient inquiring if [name] would prescribe it for her.  Advised patient that I would check with [name] and let her know.  Patient voices understanding.

## 2014-08-11 NOTE — Telephone Encounter (Signed)
Rx for fioricet declined, as per [name], patient should obtain this medication from her PCP.

## 2014-08-11 NOTE — Telephone Encounter (Signed)
Spoke to patient and advised her that [facility] drug information states that patients who have had cancer should not use Vasculera and therefore [name] wouldn't recommend using this medication.  Patient voices understanding and denies any further questions at this time.

## 2014-08-11 NOTE — Telephone Encounter (Signed)
Patient notified that prescription for Fioricet needed to be filled by her PCP.  Patient states that it was a mistake and that prescription should've gone to her neurologist and that she has already taken care of it.  Patient denies any further questions at this time.

## 2014-08-11 NOTE — Telephone Encounter (Addendum)
Called patient and left message on voicemail to call office back.

## 2014-08-14 ENCOUNTER — Encounter: Primary: Family Medicine

## 2014-08-19 NOTE — Telephone Encounter (Signed)
Patient called requesting a return call from [name]. Call back number [phone]

## 2014-08-19 NOTE — Telephone Encounter (Signed)
Called and spoke to patient, who states that she needs to have a crown replaced and was inquiring if she needed to be on an antibiotic prior to having it replaced.  Patient also states that her appointment for Prolia injection is on 10/07/14, but she would like to come in five days early because she will be out of town on 10/07/14.  Advised patient that per Denny Peon, NP, it was ok for her to get her crown replaced, and to inform her dentist that she was on Prolia, and if her dentist felt like she needed to be on antibiotics prior to, their office could prescribe them.  Advised patient that per Denny Peon, NP, it was ok for her to receive her Prolia five days early.  Patient voices understanding and was transferred to [facility] to change appointment date.

## 2014-08-21 ENCOUNTER — Encounter: Primary: Family Medicine

## 2014-09-01 MED ORDER — DIAZEPAM 5 MG TAB
5 mg | ORAL_TABLET | ORAL | Status: DC
Start: 2014-09-01 — End: 2014-09-03

## 2014-09-03 MED ORDER — DIAZEPAM 5 MG TAB
5 mg | ORAL_TABLET | ORAL | Status: DC
Start: 2014-09-03 — End: 2014-10-22

## 2014-09-04 NOTE — Telephone Encounter (Signed)
I called patient's pharmacy to verify prescription was available. They assured me the diazepam was ready for patient to pick up. I called patient back to make her aware of this. She was very Patent attorney.

## 2014-09-04 NOTE — Telephone Encounter (Signed)
Patient called requesting refill of diazepam prescription. Will route this to Dr. Drucilla Schmidt and follow up with the patient.

## 2014-09-16 NOTE — Telephone Encounter (Signed)
Patient needs a letter for [employer] as she is applying for disability.  I told her Dr. Drucilla Schmidt would write a letter for her.   I will call when it is ready.      Wants to come off of her AI as she is so miserable.  I told her to ask [name] about a holiday from the medicine or alternate taking it some days and some days off.  She was appreciative of the advice.

## 2014-09-18 ENCOUNTER — Ambulatory Visit
Admit: 2014-09-18 | Discharge: 2014-09-18 | Payer: PRIVATE HEALTH INSURANCE | Attending: Acute Care | Primary: Family Medicine

## 2014-09-18 DIAGNOSIS — C50912 Malignant neoplasm of unspecified site of left female breast: Secondary | ICD-10-CM

## 2014-09-18 MED ORDER — TAMOXIFEN 20 MG TAB
20 mg | ORAL_TABLET | Freq: Every day | ORAL | Status: DC
Start: 2014-09-18 — End: 2014-12-15

## 2014-09-18 NOTE — Progress Notes (Signed)
[name] is a 63 y.o. female here for follow up of breast cancer.

## 2014-09-18 NOTE — Progress Notes (Addendum)
[facility]  [place], [place]   [address]  W: [phone]   F: [phone]      f/u HEME/ONC CONSULT    Reason for visit: evaluation for treatment for  Breast Cancer    Consulting physician:  Dr. Gilford Rile, Dr. Raynald Blend, Brooke Bonito.  Ref physician:  Dr. Drucilla Schmidt    HPI:   Emily [name] is a 63 y.o.  female who I am seeing for adjuvant therapy for breast cancer.        She had a screening ultrasound on 09/24/12 showing a 2.6 cm left LN.  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  HER 2 negative IHC at 0.    She has a history of a pheochromocytoma surgically removed in 1997.    She then had a MRI breast on 10/05/12 showing the 2.8 x 2 cm LN as well as a left breast LIQ 0.9 cm x 1.1 cm x 0.6 cm mass.  Biopsy of that mass by FNA on 10/16/12 shows a poorly differentiated carcinoma.  These slides were air-dried, HER 2 not able to be performed on them.    Mammaprint of LN shows high risk luminal, ER + at 0.19, PR + at 0.61, HER 2 negative at -0.69.    She underwent L mastectomy on 11/28/12 showing IDC with neuroendocrine features, gr 2, 0.5 cm, DCIS gr 2-3, + LVI, 1/14 LN + with a 1 cm met, no ECE.  HER 2 negative (ratio 1.2, sig/cell 2.8), ER + 95%, PR + 5%; ki67 5%; DCIS ER + 95%, PR + 1%.    S/p TC q 3 weeks x 4 from 12/25/12-02/26/13 (75 mg/m2; 600 mg/m2)    S/p XRT from 04/02/13-05/10/13    Started anastrozole 05/17/13, stop 07/30/13 (joint pains)    exemestane 08/06/13-09/30/13 (joint pains)    Started zometa 07/10/13, q 6 months x 3 years, stopped     Started tamoxifen 09/30/13, stopped on 03/08/14 due to joint pains    Started letrozole 03/23/14, stopped 09/18/14 due to joint pains     Interval History: In today for follow up.  Joint pain has been severe.  Initially better for the first 3 months of letrozole, but significantly worse in the previous 3 months.  Headaches have improved, she has received botox injections.  Today complains of: gr 2 fatigue, gr 1 hot flashes, gr 2 insomnia, gr 1 anxiety, gr 3 pain in joints 6/10, gr 1 shortness of breath, gr 1 numbness/tingling, gr 2 headache, gr 2 loss of libido, gr 3 vaginal dryness.      LMP 1996.    DX   Encounter Diagnosis   Name Primary?   ??? Breast cancer, stage 2, left ([place]) Yes      Past Medical History   Diagnosis Date   ??? Cancer ([place])      left breast  cancer   ??? Anxiety    ??? Arthritis    ??? Nausea & vomiting    ??? Concussion 2011   ??? Chronic tension headaches    ??? Headache(784.0)      OCCAS   ??? Other ill-defined conditions(799.89)      PHEOCHROMOCYTOMA   ??? Other and unspecified symptoms and signs involving general sensations  and perceptions      HIVES RECENTLY- WAS IN ER Feb 19, 2013   ??? Lymphedema of arm      left     Past Surgical History   Procedure Laterality Date   ??? Hx orthopaedic       bilateral shoulder arthroscopy   ??? Hx modified radical mastectomy  11/28/2012     LEFT BREAST MODIFIED RADICAL MASTECTOMY WITH AXILLARY DISSECTION W/ RECONSTRUCTION LEFT BREAST WITH TISSUE EXPANDER AND ALLODERM;PORTACATH INSERTION performed by Ty Hilts, MD at [facility]   ??? Hx gyn       c-section   ??? Hx oophorectomy       right   ??? Hx heent       T&A   ??? Hx heent       right adrenal removed   ??? Pr abdomen surgery proc unlisted       hernia mesh repair   ??? Hx appendectomy     ??? Pr abdomen surgery proc unlisted       right adenal removed   ??? Hx vascular access       PORT, THEN REMOVED   ??? Pr breast surgery procedure unlisted  '91     breast implants; replaced bilat implants 2011   ??? Hx breast reconstruction  11/28/2012     BREAST RECONSTRUCTION /C  INSERTION EXPANDER & ALLODERM performed by Sharlotte Alamo, MD at [facility]    ??? Hx mastectomy Left    ??? Hx breast reconstruction Bilateral 09/16/2013     REMOVAL AND REPLACEMENT LEFT BREAST IMPLANT TISSUE EXPANDERS WITH SUBTOTAL CAPSULECTOMY, RIGHT BREAST IMPLANT REMOVAL AND REPLACEMENT FOR SYMMETRY/FAT GRAFTING TO BILATERAL BREASTS performed by Sharlotte Alamo, MD at [facility]   ??? Hx breast augmentation Left 04/22/2014     REMOVAL OF LEFT BREAST IMPLANT, WASHOUT AND CLOSURE OF LEFT BREAST WOUND performed by Sharlotte Alamo, MD at [facility]     History     Social History   ??? Marital Status: SINGLE     Spouse Name: N/A     Number of Children: N/A   ??? Years of Education: N/A     Social History Main Topics   ??? Smoking status: Never Smoker    ??? Smokeless tobacco: Never Used   ??? Alcohol Use: No   ??? Drug Use: No   ??? Sexual Activity: Not on file     Other Topics Concern   ??? Not on file     Social History Narrative     Family History   Problem Relation Age of Onset   ??? Alzheimer Mother    ??? Diabetes Mother    ??? Other Mother      COMBATIVE POST ANESTHESIA   ??? Heart Disease Father    ??? Diabetes Father    ??? Lung Disease Father      COPD   ??? Headache Sister    ??? Migraines Sister    ??? Anesth Problems Neg Hx        Current Outpatient Prescriptions   Medication Sig Dispense Refill   ??? tamoxifen (NOLVADEX) 20 mg tablet Take 1 Tab by mouth daily. 90 Tab 3   ??? sertraline (ZOLOFT) 100 mg tablet Take 1 Tab by mouth daily. 30 Tab 3   ??? DYMISTA 137-50 mcg/spray spry      ??? fluconazole (DIFLUCAN) 150 mg tablet   1   ??? gabapentin (NEURONTIN) 100 mg  capsule   36   ??? lidocaine (LIDODERM) 5 %(700 mg/patch)   2   ??? BOTOX 200 unit injection      ??? spironolactone (ALDACTONE) 25 mg tablet Take 1 Tab by mouth daily. 30 Tab 3   ??? diazepam (VALIUM) 5 mg tablet TAKE 1 TABLET EVERY 6 HOURS AS NEEDED 30 Tab 2   ??? PROCTOSOL HC 2.5 % rectal cream   0   ??? oxyCODONE IR (ROXICODONE) 5 mg immediate release tablet   0   ??? promethazine (PHENERGAN) 25 mg suppository   2   ??? promethazine (PHENERGAN) 25 mg tablet   2    ??? butalbital-acetaminophen-caffeine (FIORICET, ESGIC) 50-325-40 mg per tablet Take 1 Tab by mouth every four (4) hours as needed for Headache. Max Daily Amount: 6 Tabs. 30 Tab 0   ??? oxyCODONE-acetaminophen (PERCOCET) 5-325 mg per tablet Take 2 tablets by mouth every four (4) hours as needed. 80 tablet 0       Allergies   Allergen Reactions   ??? Aspirin Unknown (comments)     Gi bleeding   ??? Compazine [Prochlorperazine] Rash   ??? Keflex [Cephalexin] Rash   ??? Levaquin [Levofloxacin] Hives     DENIES ALLERGY   ??? Nsaids (Non-Steroidal Anti-Inflammatory Drug) Swelling     Throat swells   ??? Prednisone Rash     DENIES ALLERGY       Review of Systems    A comprehensive review of systems was performed and all systems were negative except for HPI and for the symptom report form, reviewed and scanned in.      Objective:  Physical Exam:  BP 130/72 mmHg   Pulse 73   Temp(Src) 97 ??F (36.1 ??C)   Resp 18   Ht $R'5\' 6"'nP$  (1.676 m)   Wt 132 lb (59.875 kg)   BMI 21.32 kg/m2   SpO2 99%    General:  Alert, cooperative, no distress, appears stated age.   Head:  Normocephalic, without obvious abnormality, atraumatic.   Eyes:  Conjunctivae/corneas clear. PERRL, EOMs intact.   Throat: Lips, mucosa, and tongue normal.    Neck: Supple, symmetrical, trachea midline, no adenopathy, thyroid: no enlargement/tenderness/nodules   Back:   Symmetric, no curvature. ROM normal. No CVA tenderness.   Lungs:   Clear to auscultation bilaterally.   Chest wall:  No tenderness or deformity.   Heart:  Regular rate and rhythm, S1, S2 normal, no murmur, click, rub or gallop.   Abdomen:   Soft, non-tender. Bowel sounds normal. No masses,  No organomegaly.   Extremities: Trace lymphedema to L arm.  Extremities normal, atraumatic, no cyanosis.   Skin: No rash   Lymph nodes: Cervical, supraclavicular, and axillary nodes normal.   Neurologic: CNII-XII intact.       Diagnostic Imaging   Results for orders placed in visit on 05/29/14   XR SPINE CERV 4 OR 5 V        Results for orders placed in visit on 10/22/12   PET BREAST CANCER DIAGNOSIS   10/12/12 PET:  Negative for distant mets  10/22/12 brain MRI:  Negative    09/24/12 dexa L hip T -2.5; L spine T -2.3  osteoporosis    12/11/13 bilateral MRI  Negative    03/26/14 MRI Brain  Negative    Lab Results  Lab Results   Component Value Date/Time    WBC 4.7 09/09/2013 10:19 AM    HGB 12.2 09/09/2013 10:19 AM    HCT 37.0  09/09/2013 10:19 AM    PLATELET 253 09/09/2013 10:19 AM    MCV 88.7 09/09/2013 10:19 AM       Lab Results   Component Value Date/Time    SODIUM 144 02/05/2013 09:44 AM    POTASSIUM 4.4 02/05/2013 09:44 AM    CHLORIDE 105 02/05/2013 09:44 AM    CO2 29 02/05/2013 09:44 AM    ANION GAP 10 02/05/2013 09:44 AM    GLUCOSE 101 02/05/2013 09:44 AM    BUN 14 02/05/2013 09:44 AM    CREATININE 0.32 02/05/2013 09:44 AM    BUN/CREATININE RATIO 44 02/05/2013 09:44 AM    GFR EST AA >60 02/05/2013 09:44 AM    GFR EST NON-AA >60 02/05/2013 09:44 AM    CALCIUM 8.8 02/05/2013 09:44 AM    ALT 52 02/05/2013 09:44 AM    AST 22 02/05/2013 09:44 AM    ALK. PHOSPHATASE 136 02/05/2013 09:44 AM    PROTEIN, TOTAL 6.2 02/05/2013 09:44 AM    ALBUMIN 3.8 02/05/2013 09:44 AM    GLOBULIN 2.4 02/05/2013 09:44 AM    A-G RATIO 1.6 02/05/2013 09:44 AM     Assessment/Plan:  63 y.o. female with a 0.5 cm left breast cancer, IDC with endocrine features, 1/14 LN +, gr 2, high risk mammaprint,  ER and PR +, HER 2 negative. PS 0    1. Breast cancer with neuroendocrine features, stage IIA, [id number]    With no further therapy, her RR is about 36%; endocrine therapy will decrease her RR to 19%; and 2nd gen chemo will decrease her RR to 12%; 3rd gen to 10%.  These #s may be higher with her high risk mammaprint.     Based on her pheo and now breast cancer history, I recommended genetic counseling -- referral to [facility] made at prior visit. Has seen them and they have performed BRCA testing -- negative.  [facility] has run a PGL panel on 09/10/14, awaiting results.      No evidence of recurrence.      Due for imaging in March 2016, MRI breast ordered.  Patient is unable to tolerate mammogram due to pain.     Unable to tolerate letrozole due to joint pains.   Discussed today that tamoxifen does not have joint pains as a significant side effect.  Joint pain is aggravated by her personal history of RA.  After discussion, she is willing to retry tamoxifen.    Rx tamoxifen 20 mg daily faxed to preferred pharmacy, she is instructed to start this on January 1st.  Close clinical follow up in February to monitor tolerability.    2. Anxiety/Depression: Improved today; continue sertraline 100 mg daily.   Valium prn.  Renee Ramus met with her today.      3. Osteoporosis:  We discussed the data from [place], [place] 2013, for the meta-analysis for adjuvant bisphosphonate use in breast cancer.  We discussed that in postmenopausal women, it appears that the bisphosphate zolendronic acid, given as in ABCSG 12 at 4 mg IV q 6 months x 3 years, is beneficial in improving bone DFS and OS.  We discussed side effects such as ONJ and the need to avoid invasive dental procedures while on these medications, renal damage, and hypocalcemia.      Had received zometa previously, which was started 07/10/13.  Switched to prolia 60 mg SC q 6 months, received 04/08/14.  Next appointment in [facility] on 09/29/14.    DEXA scheduled for 10/01/14.  Vit D3 2000 International Units daily, not currently taking.    No clinically significant interaction with tamoxifen and zoloft    4. Joint pains: also has RA; Significantly worse with letrozole.  Joint pain is usually not as severe with tamoxifen, retry tamoxifen as above.  Will monitor.     5. Fibromyalgia/RA:  Not currently on any treatment, discussed with patient that there are medical options to assist with her pain.  She will follow up with her Rheumatologist.  Previously did not tolerate lyrica due to sedation.     6. Headaches:  Improved today, 12/11/13 Brain MRI negative, following with Neurology, has received Botox injections previously.      Follow-up Disposition:  Return in 2 months (on 11/27/2014) for follow up, Kenlea Woodell/maskell.    Sonda Rumble, MD

## 2014-09-18 NOTE — Patient Instructions (Addendum)
Stop letrozole (femara)    On January 2nd, restart tamoxifen.    Discuss the following with your GYN for painful intercourse, there is new research that shows this is can be very helpful:   4% aqueous lidocaine compress to vulvar vestibule for 3 min with cotton ball then immediately apply silicone lubricant liberally.    Try Replens (over the counter) for vaginal dryness.

## 2014-09-29 MED ORDER — DENOSUMAB 60 MG/ML SUB-Q SYRINGE
60 mg/mL | Freq: Once | SUBCUTANEOUS | Status: AC
Start: 2014-09-29 — End: 2014-10-01
  Administered 2014-10-01: 19:00:00 via SUBCUTANEOUS

## 2014-09-29 NOTE — Telephone Encounter (Signed)
09/29/2014- Phone call returned phone she reported that she is having PDT (photodynamic therapy)  treatments completed tomorrow with her dermatologist. She questioned if safe to be completed with her lymphedema history and also receiving prolia 24 hours treatments post treatments. Per Dr.Irvin okay to get treatments completed with dermatologist as long as they are aware of cancer and lymphedema history. She confirmed they are aware and denied any further questions or concerns.

## 2014-09-29 NOTE — Telephone Encounter (Signed)
Patient called and has a PDT treatment scheduled for 8am tomorrow 12/29. She was wondering if it is safe to have done to both arms due to her lymphedema. XT#[id number] Thank You.

## 2014-10-01 ENCOUNTER — Inpatient Hospital Stay: Admit: 2014-10-01 | Payer: BLUE CROSS/BLUE SHIELD | Primary: Family Medicine

## 2014-10-01 DIAGNOSIS — M810 Age-related osteoporosis without current pathological fracture: Secondary | ICD-10-CM

## 2014-10-01 LAB — PHOSPHORUS: Phosphorus: 3.9 MG/DL (ref 2.6–4.7)

## 2014-10-01 LAB — POC CHEM8
Anion gap (POC): 16 mmol/L — ABNORMAL HIGH (ref 5–15)
BUN (POC): 10 MG/DL (ref 9–20)
CO2 (POC): 26 MMOL/L (ref 21–32)
Calcium, ionized (POC): 1.2 MMOL/L (ref 1.12–1.32)
Chloride (POC): 102 MMOL/L (ref 98–107)
Creatinine (POC): 0.5 MG/DL — ABNORMAL LOW (ref 0.6–1.3)
GFRAA, POC: >60 mL/min/{1.73_m2} (ref >60)
GFRNA, POC: >60 mL/min/{1.73_m2} (ref >60)
Glucose (POC): 135 MG/DL — ABNORMAL HIGH (ref 65–105)
Hematocrit (POC): 38 % (ref 35.0–47.0)
Hemoglobin (POC): 12.9 GM/DL (ref 11.5–16.0)
Potassium (POC): 3.8 MMOL/L (ref 3.5–5.1)
Sodium (POC): 139 MMOL/L (ref 136–145)

## 2014-10-01 LAB — MAGNESIUM: Magnesium: 1.8 mg/dL (ref 1.6–2.4)

## 2014-10-01 MED FILL — PROLIA 60 MG/ML SUBCUTANEOUS SYRINGE: 60 mg/mL | SUBCUTANEOUS | Qty: 1

## 2014-10-01 NOTE — Progress Notes (Signed)
1310 Pt admit to [facility] for Prolia injection. Pt ambulatory in stable condition. Assessment completed. No new concerns voiced.    Patient Vitals for the past 12 hrs:   Temp Pulse BP SpO2   10/01/14 1310 97.5 ??F (36.4 ??C) 80 105/65 mmHg 99 %     Recent Results (from the past 12 hour(s))   MAGNESIUM    Collection Time: 10/01/14  1:24 PM   Result Value Ref Range    Magnesium 1.8 1.6 - 2.4 mg/dL   PHOSPHORUS    Collection Time: 10/01/14  1:24 PM   Result Value Ref Range    Phosphorus 3.9 2.6 - 4.7 MG/DL   POC CHEM8    Collection Time: 10/01/14  1:28 PM   Result Value Ref Range    Calcium, ionized (POC) 1.20 1.12 - 1.32 MMOL/L    Sodium (POC) 139 136 - 145 MMOL/L    Potassium (POC) 3.8 3.5 - 5.1 MMOL/L    Chloride (POC) 102 98 - 107 MMOL/L    CO2 (POC) 26 21 - 32 MMOL/L    Anion gap (POC) 16 (H) 5 - 15 mmol/L    Glucose (POC) 135 (H) 65 - 105 MG/DL    BUN (POC) 10 9 - 20 MG/DL    Creatinine (POC) 0.5 (L) 0.6 - 1.3 MG/DL    GFR-AA (POC) >60 >60 ml/min/1.13m2    GFR, non-AA (POC) >60 >60 ml/min/1.64m2    Hemoglobin (POC) 12.9 11.5 - 16.0 GM/DL    Hematocrit (POC) 38 35.0 - 47.0 %    Comment Comment Not Indicated.       ?? ??  Medications:  Prolia    1345 Pt tolerated treatment well. Pt d/c to home ambulatory in no distress. Pt aware of next appointment scheduled for 03/10/15.  ????

## 2014-10-06 NOTE — Progress Notes (Signed)
Mailed disability letter to patient - see letters in chart.

## 2014-10-07 ENCOUNTER — Encounter: Payer: BLUE CROSS/BLUE SHIELD | Primary: Family Medicine

## 2014-10-23 MED ORDER — DIAZEPAM 5 MG TAB
5 mg | ORAL_TABLET | ORAL | Status: DC
Start: 2014-10-23 — End: 2014-11-05

## 2014-10-29 NOTE — Telephone Encounter (Signed)
Pt was returning [name]'s call. Callback number is [phone]. Thanks.

## 2014-10-29 NOTE — Telephone Encounter (Signed)
Pt called stating that she has been experiencing some problems with taking her medication Tamoxifen such as restless nights and feeling hard to deal with during the day. Pt described it as a life altering drug and wants to know what are her options. She has been taking valium with it at night. Call back number is [phone]. Thanks.

## 2014-10-29 NOTE — Telephone Encounter (Signed)
Spoke to patient who states she has been back on Tamoxifen since the first of January and has been taking it at bedtime so that she wouldn't feel so irritable and groggy during the day, but states she is now having trouble sleeping and has been taking Valium at night to help her sleep.  Patient states she doesn't know if she can continue taking it.  Advised patient that per [name], she could stop the Tamoxifen and come and see him in the office in two weeks to see if her symptoms resolve off of the Tamoxifen and to discuss other treatment options.  Patient states she doesn't know if she is ready to "give up" on the Tamoxifen yet, and states she wants to try taking it earlier in the day and see how she feels on it.  Patient states she will call office back to inform us if she is able to tolerate Tamoxifen by taking it at a different time of the day, or if she decides to stop it.  Patient denies any further questions at this time.

## 2014-11-05 MED ORDER — SERTRALINE 100 MG TAB
100 mg | ORAL_TABLET | ORAL | Status: DC
Start: 2014-11-05 — End: 2015-03-06

## 2014-11-05 MED ORDER — DIAZEPAM 5 MG TAB
5 mg | ORAL_TABLET | ORAL | Status: DC
Start: 2014-11-05 — End: 2014-11-12

## 2014-11-10 ENCOUNTER — Telehealth

## 2014-11-10 NOTE — Telephone Encounter (Signed)
Patient called and stated that she is having sporadic pains in her Right Breast when she lays down that she does not believe are muscular. It has only happened twice. She would like to know if she could have her MRI done earlier. CB#[phone]

## 2014-11-10 NOTE — Telephone Encounter (Signed)
Called and left message on patient's voicemail advising her that MRI had been ordered and that scheduler would be in contact with her and to call office with any questions or problems.  Per [name], orders received for the following:  Orders Placed This Encounter   ??? MRI BREAST BILAT WO W CON CAD EX     Standing Status: Future      Number of Occurrences:       Standing Expiration Date: 12/09/2015     Order Specific Question:  Reason for Exam     Answer:  breast cancer     Order Specific Question:  Is Patient Allergic to Contrast Dye?     Answer:  No

## 2014-11-12 MED ORDER — DIAZEPAM 5 MG TAB
5 mg | ORAL_TABLET | ORAL | Status: DC
Start: 2014-11-12 — End: 2014-12-22

## 2014-11-20 ENCOUNTER — Encounter: Attending: Specialist | Primary: Family Medicine

## 2014-12-01 ENCOUNTER — Telehealth

## 2014-12-01 NOTE — Telephone Encounter (Signed)
Called and spoke to patient who states she has noted where she had her mastectomy on the left side under her implant, she has noticed a "pea size" lump that she is concerned about and would like to have an ultrasound when she has her MRI done this week.  Advised patient that per [name], ultrasound would be ordered and scheduler would be in contact with her.  Patient voices understanding and denies any further questions at this time.

## 2014-12-01 NOTE — Telephone Encounter (Signed)
Patient called and stated she is going for a MRI of her right breast on Thursday and would like to know if they can add a ultrasound as well on her breast because she recently found a pea size spot under her implant on the right side breast. Call back number [phone]

## 2014-12-05 ENCOUNTER — Inpatient Hospital Stay: Admit: 2014-12-05 | Payer: BLUE CROSS/BLUE SHIELD | Attending: Specialist | Primary: Family Medicine

## 2014-12-05 ENCOUNTER — Encounter

## 2014-12-05 DIAGNOSIS — N6320 Unspecified lump in the left breast, unspecified quadrant: Secondary | ICD-10-CM

## 2014-12-05 DIAGNOSIS — N63 Unspecified lump in breast: Secondary | ICD-10-CM

## 2014-12-05 MED ORDER — GADOPENTETATE DIMEGLUMINE 7.5 MMOL/15 ML (469.01 MG/ML) IV
7.5 mmol/15 mL (469.01 mg/mL) | Freq: Once | INTRAVENOUS | Status: AC
Start: 2014-12-05 — End: 2014-12-05
  Administered 2014-12-05: 16:00:00 via INTRAVENOUS

## 2014-12-05 MED FILL — MAGNEVIST 7.5 MMOL/15 ML (469.01 MG/ML) INTRAVENOUS SOLUTION: 7.5 mmol/15 mL (469.01 mg/mL) | INTRAVENOUS | Qty: 13

## 2014-12-05 NOTE — Telephone Encounter (Signed)
Spoke with patient and advised. Copies of reports mailed to patient per her request.

## 2014-12-05 NOTE — Progress Notes (Signed)
Quick Note:        Let her know this is normal thanks    ______

## 2014-12-05 NOTE — Telephone Encounter (Signed)
-----   Message from Joyce Gross, MD sent at 12/05/2014  2:51 PM EST -----  Let her know this is normal thanks

## 2014-12-05 NOTE — Telephone Encounter (Signed)
Called patient to advise of results of MRI per [name], no answer, left message to call back.

## 2014-12-15 ENCOUNTER — Ambulatory Visit
Admit: 2014-12-15 | Discharge: 2014-12-15 | Payer: PRIVATE HEALTH INSURANCE | Attending: Acute Care | Primary: Family Medicine

## 2014-12-15 DIAGNOSIS — C50919 Malignant neoplasm of unspecified site of unspecified female breast: Secondary | ICD-10-CM

## 2014-12-15 MED ORDER — ANASTROZOLE 1 MG TAB
1 mg | ORAL_TABLET | Freq: Every day | ORAL | Status: DC
Start: 2014-12-15 — End: 2015-03-10

## 2014-12-15 NOTE — Progress Notes (Signed)
[facility]  [address], [place]   [address]  W: [phone]   F: [phone]      f/u HEME/ONC CONSULT    Reason for visit: evaluation for treatment for  Breast Cancer    Consulting physician:  Dr. Gilford Rile, Dr. Raynald Blend, Brooke Bonito.  Ref physician:  Dr. Drucilla Schmidt    HPI:   Emily [name] is a 64 y.o.  female who I am seeing for adjuvant therapy for breast cancer.      She had a screening ultrasound on 09/24/12 showing a 2.6 cm left LN.  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  HER 2 negative IHC at 0.    She has a history of a pheochromocytoma surgically removed in 1997.    She then had a MRI breast on 10/05/12 showing the 2.8 x 2 cm LN as well as a left breast LIQ 0.9 cm x 1.1 cm x 0.6 cm mass.  Biopsy of that mass by FNA on 10/16/12 shows a poorly differentiated carcinoma.  These slides were air-dried, HER 2 not able to be performed on them.    Mammaprint of LN shows high risk luminal, ER + at 0.19, PR + at 0.61, HER 2 negative at -0.69.    She underwent L mastectomy on 11/28/12 showing IDC with neuroendocrine features, gr 2, 0.5 cm, DCIS gr 2-3, + LVI, 1/14 LN + with a 1 cm met, no ECE.  HER 2 negative (ratio 1.2, sig/cell 2.8), ER + 95%, PR + 5%; ki67 5%; DCIS ER + 95%, PR + 1%.    S/p TC q 3 weeks x 4 from 12/25/12-02/26/13 (75 mg/m2; 600 mg/m2)    S/p XRT from 04/02/13-05/10/13    Started anastrozole 05/17/13, stop 07/30/13 (joint pains)    exemestane 08/06/13-09/30/13 (joint pains)    Started zometa 07/10/13, q 6 months x 3 years, stopped     Started tamoxifen 09/30/13, stopped on 03/08/14 due to joint pains    Started letrozole 03/23/14, stopped 09/18/14 due to joint pains     Started tamoxifen 10/04/14, stopped on 12/15/14 due to hot flashes and mood swings.    Interval History: In today for follow up.  Has been taking tamoxifen since 10/04/2014, has noted joint pain has improved however has had severe hot flashes and mood swings.  Continues on zoloft 100 mg.  Following with Dr. Verl Blalock monthly due to surgical wound healing difficulty, potentially will undergo revision of reconstruction in the future.  Today complains of: gr 1 fatigue, gr 2 hot flashes, gr 1 insomnia, gr 2 anxiety.gr 1 shortness of breath, gr 1 numbness/tingling, gr 2 headache, gr 2 loss of libido.        LMP 1996.    DX   Encounter Diagnosis   Name Primary?   ??? Breast cancer, stage 2, unspecified laterality ([place]) Yes      Past Medical History   Diagnosis Date   ??? Cancer ([place])      left breast  cancer   ??? Anxiety    ??? Arthritis    ??? Nausea & vomiting    ??? Concussion 2011   ??? Chronic tension headaches    ??? Headache(784.0)      OCCAS   ???  Other ill-defined conditions(799.89)      PHEOCHROMOCYTOMA   ??? Other and unspecified symptoms and signs involving general sensations and perceptions      HIVES RECENTLY- WAS IN ER Feb 19, 2013   ??? Lymphedema of arm      left     Past Surgical History   Procedure Laterality Date   ??? Hx orthopaedic       bilateral shoulder arthroscopy   ??? Hx modified radical mastectomy  11/28/2012     LEFT BREAST MODIFIED RADICAL MASTECTOMY WITH AXILLARY DISSECTION W/ RECONSTRUCTION LEFT BREAST WITH TISSUE EXPANDER AND ALLODERM;PORTACATH INSERTION performed by Ty Hilts, MD at [place]   ??? Hx gyn       c-section   ??? Hx oophorectomy       right   ??? Hx heent       T&A   ??? Hx heent       right adrenal removed   ??? Pr abdomen surgery proc unlisted       hernia mesh repair   ??? Hx appendectomy     ??? Pr abdomen surgery proc unlisted       right adenal removed   ??? Hx vascular access       PORT, THEN REMOVED   ??? Pr breast surgery procedure unlisted  '91      breast implants; replaced bilat implants 2011   ??? Hx breast reconstruction  11/28/2012     BREAST RECONSTRUCTION /C  INSERTION EXPANDER & ALLODERM performed by Sharlotte Alamo, MD at [facility]   ??? Hx mastectomy Left    ??? Hx breast reconstruction Bilateral 09/16/2013     REMOVAL AND REPLACEMENT LEFT BREAST IMPLANT TISSUE EXPANDERS WITH SUBTOTAL CAPSULECTOMY, RIGHT BREAST IMPLANT REMOVAL AND REPLACEMENT FOR SYMMETRY/FAT GRAFTING TO BILATERAL BREASTS performed by Sharlotte Alamo, MD at [place]   ??? Hx breast augmentation Left 04/22/2014     REMOVAL OF LEFT BREAST IMPLANT, WASHOUT AND CLOSURE OF LEFT BREAST WOUND performed by Sharlotte Alamo, MD at [place]     History     Social History   ??? Marital Status: SINGLE     Spouse Name: N/A   ??? Number of Children: N/A   ??? Years of Education: N/A     Social History Main Topics   ??? Smoking status: Never Smoker    ??? Smokeless tobacco: Never Used   ??? Alcohol Use: No   ??? Drug Use: No   ??? Sexual Activity: Not on file     Other Topics Concern   ??? None     Social History Narrative     Family History   Problem Relation Age of Onset   ??? Alzheimer Mother    ??? Diabetes Mother    ??? Other Mother      COMBATIVE POST ANESTHESIA   ??? Heart Disease Father    ??? Diabetes Father    ??? Lung Disease Father      COPD   ??? Headache Sister    ??? Migraines Sister    ??? Anesth Problems Neg Hx        Current Outpatient Prescriptions   Medication Sig Dispense Refill   ??? anastrozole (ARIMIDEX) 1 mg tablet Take 1 Tab by mouth daily. 30 Tab 3   ??? sertraline (ZOLOFT) 100 mg tablet TAKE 1 TAB BY MOUTH DAILY. 30 Tab 3   ??? DYMISTA 137-50 mcg/spray spry      ???  fluconazole (DIFLUCAN) 150 mg tablet   1   ??? gabapentin (NEURONTIN) 100 mg capsule   36   ??? PROCTOSOL HC 2.5 % rectal cream   0   ??? lidocaine (LIDODERM) 5 %(700 mg/patch)   2   ??? BOTOX 200 unit injection      ??? oxyCODONE IR (ROXICODONE) 5 mg immediate release tablet   0   ??? promethazine (PHENERGAN) 25 mg suppository   2    ??? promethazine (PHENERGAN) 25 mg tablet   2   ??? spironolactone (ALDACTONE) 25 mg tablet Take 1 Tab by mouth daily. 30 Tab 3   ??? diazepam (VALIUM) 5 mg tablet TAKE 1 TABLET EVERY 6 HOURS AS NEEDED 30 Tab 2   ??? butalbital-acetaminophen-caffeine (FIORICET, ESGIC) 50-325-40 mg per tablet Take 1 Tab by mouth every four (4) hours as needed for Headache. Max Daily Amount: 6 Tabs. 30 Tab 0   ??? oxyCODONE-acetaminophen (PERCOCET) 5-325 mg per tablet Take 2 tablets by mouth every four (4) hours as needed. 80 tablet 0       Allergies   Allergen Reactions   ??? Aspirin Unknown (comments)     Gi bleeding   ??? Compazine [Prochlorperazine] Rash   ??? Keflex [Cephalexin] Rash   ??? Levaquin [Levofloxacin] Hives     DENIES ALLERGY   ??? Nsaids (Non-Steroidal Anti-Inflammatory Drug) Swelling     Throat swells   ??? Prednisone Rash     DENIES ALLERGY       Review of Systems    A comprehensive review of systems was performed and all systems were negative except for HPI and for the symptom report form, reviewed and scanned in.      Objective:  Physical Exam:  BP 137/64 mmHg   Pulse 70   Temp(Src) 97 ??F (36.1 ??C)   Resp 18   Ht $R'5\' 6"'Jo$  (1.676 m)   Wt 128 lb (58.06 kg)   BMI 20.67 kg/m2   SpO2 99%    General:  Alert, cooperative, no distress, appears stated age.   Head:  Normocephalic, without obvious abnormality, atraumatic.   Eyes:  Conjunctivae/corneas clear. PERRL, EOMs intact.   Throat: Lips, mucosa, and tongue normal.    Neck: Supple, symmetrical, trachea midline, no adenopathy, thyroid: no enlargement/tenderness/nodules   Back:   Symmetric, no curvature. ROM normal. No CVA tenderness.   Lungs:   Clear to auscultation bilaterally.   Chest wall:  S/p bilateral mastectomy with reconstruction.  <1cm cyst palpable at lower inner aspect of L breast at the edge of implant, tender to palpation.  Surgical wound to central L breast with 2 cm area of eschar.     Heart:  Regular rate and rhythm, S1, S2 normal, no murmur, click, rub or gallop.    Abdomen:   Soft, non-tender. Bowel sounds normal. No masses,  No organomegaly.   Extremities: Trace lymphedema to L arm.  Extremities normal, atraumatic, no cyanosis.   Skin: No rash   Lymph nodes: Cervical, supraclavicular, and axillary nodes normal.   Neurologic: CNII-XII intact.       Diagnostic Imaging   Results for orders placed in visit on 05/29/14   XR SPINE CERV 4 OR 5 V       Results for orders placed in visit on 10/22/12   PET BREAST CANCER DIAGNOSIS   10/12/12 PET:  Negative for distant mets  10/22/12 brain MRI:  Negative    09/24/12 dexa L hip T -2.5; L spine T -2.3  osteoporosis  12/11/13 bilateral MRI  Negative    03/26/14 MRI Brain  Negative    12/05/14 MRI breast   Negative    12/05/14 [place] L breast  FINDINGS:?? Breast sonography was performed of the region of clinical concern ??  in the left breast/chest wall, at the sternum.?? The exam demonstrates a ??  small, hypoechoic lesion immediately below and in continuity with the dermis ??  that measures approximately 0.6 cm in maximum dimension.  .?? ????  IMPRESSION:  1. Very small, hypoechoic lesion in continuity and immediately below the ??  dermis overlying the sternum/medialmost left breast. Finding suggests dermal ??  lesion such as epidermal inclusion cyst versus postoperative change.  BI-RADS Category 2 - Benign findings. ??    Lab Results  Lab Results   Component Value Date/Time    WBC 4.7 09/09/2013 10:19 AM    HGB 12.2 09/09/2013 10:19 AM    HCT 37.0 09/09/2013 10:19 AM    PLATELET 253 09/09/2013 10:19 AM    MCV 88.7 09/09/2013 10:19 AM       Lab Results   Component Value Date/Time    SODIUM 144 02/05/2013 09:44 AM    POTASSIUM 4.4 02/05/2013 09:44 AM    CHLORIDE 105 02/05/2013 09:44 AM    CO2 29 02/05/2013 09:44 AM    ANION GAP 10 02/05/2013 09:44 AM    GLUCOSE 101 02/05/2013 09:44 AM    BUN 14 02/05/2013 09:44 AM    CREATININE 0.32 02/05/2013 09:44 AM    BUN/CREATININE RATIO 44 02/05/2013 09:44 AM    GFR EST AA >60 02/05/2013 09:44 AM     GFR EST NON-AA >60 02/05/2013 09:44 AM    CALCIUM 8.8 02/05/2013 09:44 AM    ALT 52 02/05/2013 09:44 AM    AST 22 02/05/2013 09:44 AM    ALK. PHOSPHATASE 136 02/05/2013 09:44 AM    PROTEIN, TOTAL 6.2 02/05/2013 09:44 AM    ALBUMIN 3.8 02/05/2013 09:44 AM    GLOBULIN 2.4 02/05/2013 09:44 AM    A-G RATIO 1.6 02/05/2013 09:44 AM     Assessment/Plan:  64 y.o. female with a 0.5 cm left breast cancer, IDC with endocrine features, 1/14 LN +, gr 2, high risk mammaprint,  ER and PR +, HER 2 negative. PS 0    1. Breast cancer with neuroendocrine features, stage IIA, [id number]    With no further therapy, her RR is about 36%; endocrine therapy will decrease her RR to 19%; and 2nd gen chemo will decrease her RR to 12%; 3rd gen to 10%.  These #s may be higher with her high risk mammaprint.     Based on her pheo and now breast cancer history, I recommended genetic counseling -- referral to [facility] made at prior visit. Has seen them and they have performed BRCA testing -- negative.  [facility] has run a PGL panel on 09/10/14, will obtain results    No evidence of recurrence.      MRI breast 12/05/14 negative.   Due for imaging in March 2016, MRI breast ordered.  Patient is unable to tolerate mammogram due to pain.     Restarted tamoxifen 20 mg daily but has experienced severe fatigue, mood swings and hot flashes.  Joint pain was improved, however unable to tolerate.    After discussion with patient today she is willing to retry anastrozole.    Instructed to stop tamoxifen today, start anastrozole daily in 2 weeks.  Rx for anastrozole 1 mg faxed to preferred pharmacy.  Close clinical follow up in 6 weeks to monitor tolerability.    2. Anxiety/Depression: Stable today; continue sertraline 100 mg daily.   Valium prn.  She has seen Renee Ramus previously.      3. Osteoporosis:  We discussed the data from [place], [place] 2013, for the meta-analysis for adjuvant bisphosphonate use in breast cancer.  We  discussed that in postmenopausal women, it appears that the bisphosphate zolendronic acid, given as in ABCSG 12 at 4 mg IV q 6 months x 3 years, is beneficial in improving bone DFS and OS.  We discussed side effects such as ONJ and the need to avoid invasive dental procedures while on these medications, renal damage, and hypocalcemia.      Had received zometa previously, which was started 07/10/13.  Switched to prolia 60 mg SC q 6 months, received 10/01/14.  Next appointment in [facility] on 03/10/15.    DEXA 10/01/14 at the [facility], patient reports improvement.  Will request records.    Vit D3 2000 International Units daily, not currently taking.    4. Joint pains: also has RA; Significantly worse with letrozole, improved on tamoxifen however unable to tolerate.  Restart anastrozole as above, will monitor joint pain closely.     5. Fibromyalgia/RA:  Not currently on any treatment, discussed with patient that there are medical options to assist with her pain.  She will follow up with her Rheumatologist.  She took MTX prior to cancer diagnosis which she did not tolerate.  Previously did not tolerate lyrica due to sedation.    6. Headaches:  Improved today, 12/11/13 Brain MRI negative, following with Neurology, has received Botox injections previously.    7. Lump in L breast: MRI breast 12/05/14 negative.  [place] on 12/05/14 suggests epidermal inclusion cyst vs postoperative changes.  She is following with Dr. Verl Blalock monthly who has suggested revision of reconstruction to L breast, next appointment this Wednesday.      Follow-up Disposition:  Return in about 6 weeks (around 01/26/2015) for Kerrilynn Derenzo.    Sonda Rumble, MD

## 2014-12-15 NOTE — Patient Instructions (Addendum)
Stop tamoxifen as we have discussed.  After two weeks start anastrozole daily.    We will see you back in 6 weeks.

## 2014-12-15 NOTE — Progress Notes (Signed)
Emily [name] is a 64 y.o. female here for follow up of breast cancer.

## 2014-12-19 ENCOUNTER — Encounter: Primary: Family Medicine

## 2014-12-22 MED ORDER — DIAZEPAM 5 MG TAB
5 mg | ORAL_TABLET | ORAL | Status: DC
Start: 2014-12-22 — End: 2014-12-22

## 2014-12-23 NOTE — Telephone Encounter (Signed)
Patient called because she was at [facility], and they did not have her Valium 5 mg refill.  I advised patient that Dr. Drucilla Schmidt had taken care of this electronically this morning.      Since [facility] did not have the [facility], I called [facility] at [phone] and called in Valium 5 mg #30 with two refills per verbal order of Dr. Drucilla Schmidt.

## 2014-12-25 MED ORDER — DIAZEPAM 5 MG TAB
5 mg | ORAL_TABLET | ORAL | Status: DC
Start: 2014-12-25 — End: 2015-03-23

## 2014-12-26 ENCOUNTER — Inpatient Hospital Stay: Admit: 2014-12-26 | Payer: BLUE CROSS/BLUE SHIELD | Primary: Family Medicine

## 2014-12-26 ENCOUNTER — Encounter: Attending: Family | Primary: Family Medicine

## 2014-12-26 DIAGNOSIS — I890 Lymphedema, not elsewhere classified: Secondary | ICD-10-CM

## 2014-12-26 NOTE — Progress Notes (Addendum)
[facility]  Lymphedema Clinic and Cancer Rehabilitation  [address]  [address]  [place], [place]  [address]      EVALUATION    NAME: LUX SKILTON AGE: 64 y.o.  GENDER: female  DATE: 12/26/2014  REFERRING PHYSICIAN: Orlin Hilding, MD: CC: Randal Buba, MD  HISTORY AND BACKGROUND:  Pt is a 64 y/o female returns for evaluation of L UE lymphedema.  Pt reports she has continued with consistent wear of day/night time compression garments.  Pt not using vasopneumatic pump system currently per Dr. Rosie Fate.  Pt reports forearm remains most problematic area.      Breast cancer history at time of initial evaluation as follows:    Pt found to have 2.6 cm LN L axilla 09/24/2012.  Underwent biopsy on 10/02/2012 with findings of metastatic neoplasm.  MRI on 10/05/2012 revealed L breast mass in addition to lymph node.  Biopsy of breast mass on 10/16/2012 showed poorly differentiated carcinoma.  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  Pt completed chemotherapy and XRT.  Pt received care to address L UE lymphedema at [facility], prior to coming to this clinic. Pt presented at initial eval with pain L axilla, reporting "fluid pocket" in region, severe pain with shoulder movement, and feeling of "fullness" L upper arm. Pt stated symptoms presented post surgery.  Pt reports bilat shoulder dysfunction prior to cancer diagnosis, having undergone R rotator cuff repair 7 years ago, L rotator cuff repair 3 years ago. Pt is s/p bilateral breast reconstruction 09/16/2013.  States additional reconstructive surgery scheduled for 01/30/2014.  Pt underwent multiple surgeries for reconstruction bilateral breasts, reduction with implant placement R  breast, small implant placement with fat grafting L breast.  Pt states continued concerns regarding healing L implant was tenuous, and she has refrained from use of vasopneumatic pump to address lymphedema per Dr. Benjiman Core order.  Pt reports she has been compliant with day/night time compression garment wear L UE, as instructed.    Primary Diagnosis:  ?? 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 Malignant neoplasm of breast (C50.2)  Date of Onset: 11/29/2011  Present Symptoms and Functional Limitations: Pain L axilla/upper arm/upper quadrant anterior/lateral aspect.  L UE lymphedema.  LLIS lymphedema outcomes measure:  65/90; 56% impairment.  Past Medical History:   Past Medical History   Diagnosis Date   ??? Cancer (HCC)      left breast  cancer   ??? Anxiety    ??? Arthritis    ??? Nausea & vomiting    ??? Concussion 2011   ??? Chronic tension headaches    ??? Headache(784.0)      OCCAS   ??? Other ill-defined conditions(799.89)      PHEOCHROMOCYTOMA   ??? Other and unspecified symptoms and signs involving general sensations and perceptions      HIVES RECENTLY- WAS IN ER Feb 19, 2013   ??? Lymphedema of arm      left     Past Surgical History   Procedure Laterality Date   ??? Hx orthopaedic       bilateral shoulder arthroscopy   ??? Hx modified radical mastectomy  11/28/2012     LEFT BREAST MODIFIED RADICAL MASTECTOMY WITH AXILLARY DISSECTION W/ RECONSTRUCTION LEFT BREAST WITH TISSUE EXPANDER  AND ALLODERM;PORTACATH INSERTION performed by Ty Hilts, MD at [place]   ??? Hx gyn       c-section   ??? Hx oophorectomy       right   ??? Hx heent       T&A   ??? Hx heent       right adrenal removed   ??? Pr abdomen surgery proc unlisted       hernia mesh repair   ??? Hx appendectomy     ??? Pr abdomen surgery proc unlisted       right adenal removed   ??? Hx vascular access       PORT, THEN REMOVED   ??? Pr breast surgery procedure unlisted  '91      breast implants; replaced bilat implants 2011   ??? Hx breast reconstruction  11/28/2012     BREAST RECONSTRUCTION /C  INSERTION EXPANDER & ALLODERM performed by Sharlotte Alamo, [place] at [place]   ??? Hx mastectomy Left    ??? Hx breast reconstruction Bilateral 09/16/2013     REMOVAL AND REPLACEMENT LEFT BREAST IMPLANT TISSUE EXPANDERS WITH SUBTOTAL CAPSULECTOMY, RIGHT BREAST IMPLANT REMOVAL AND REPLACEMENT FOR SYMMETRY/FAT GRAFTING TO BILATERAL BREASTS performed by Sharlotte Alamo, MD at [place]   ??? Hx breast augmentation Left 04/22/2014     REMOVAL OF LEFT BREAST IMPLANT, WASHOUT AND CLOSURE OF LEFT BREAST WOUND performed by Sharlotte Alamo, MD at [facility] MAIN OR     Current Medications:    Current Outpatient Prescriptions   Medication Sig   ??? diazepam (VALIUM) 5 mg tablet TAKE 1 TABLET EVERY 6 HOURS AS NEEDED   ??? anastrozole (ARIMIDEX) 1 mg tablet Take 1 Tab by mouth daily.   ??? sertraline (ZOLOFT) 100 mg tablet TAKE 1 TAB BY MOUTH DAILY.   ??? DYMISTA 137-50 mcg/spray spry    ??? fluconazole (DIFLUCAN) 150 mg tablet    ??? gabapentin (NEURONTIN) 100 mg capsule    ??? PROCTOSOL HC 2.5 % rectal cream    ??? lidocaine (LIDODERM) 5 %(700 mg/patch)    ??? BOTOX 200 unit injection    ??? oxyCODONE IR (ROXICODONE) 5 mg immediate release tablet    ??? promethazine (PHENERGAN) 25 mg suppository    ??? promethazine (PHENERGAN) 25 mg tablet    ??? butalbital-acetaminophen-caffeine (FIORICET, ESGIC) 50-325-40 mg per tablet Take 1 Tab by mouth every four (4) hours as needed for Headache. Max Daily Amount: 6 Tabs.   ??? spironolactone (ALDACTONE) 25 mg tablet Take 1 Tab by mouth daily.   ??? oxyCODONE-acetaminophen (PERCOCET) 5-325 mg per tablet Take 2 tablets by mouth every four (4) hours as needed.     No current facility-administered medications for this encounter.     Allergies:   Allergies   Allergen Reactions   ??? Aspirin Unknown (comments)     Gi bleeding   ??? Compazine [Prochlorperazine] Rash   ??? Keflex [Cephalexin] Rash    ??? Levaquin [Levofloxacin] Hives     DENIES ALLERGY   ??? Nsaids (Non-Steroidal Anti-Inflammatory Drug) Swelling     Throat swells   ??? Prednisone Rash     DENIES ALLERGY      Social/Work History and Prior Level of Function:  Pt reports continuing intermittent part-time work schedule.  Pt lives in [place].  Pt has a 95 year old son which she and her partner care for.   Living Situation: Pt lives near her work place, which is a  good distance from this clinic, probably an hour one way.  Lives with boyfriend and young son.     Trainable Caregiver?: Pt's significant other is an Chief Financial Officer.  Available to assist pt as needed.    Self-care/ADLs: performing mod I, additional time, UE dressing deliberately to avoid pain L UE.     Mobility: indep transfers, bed mobility, gait in clinic.   Sleeping Arrangement:  Bed.   Adaptive Equipment Owned: na  Previous Therapy:  Pt received therapy for lymphedema management at [facility] prior to coming to this clinic. 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  Pt experienced progression of stage 0 lymphedema to stage II lymphedema L UE end of May, with increase in limb volume beginning of June 2015.  Phase I CDT initiated at that time including application of compression bandaging. Custom flat knit compression garments for daytime use and Solaris Tribute for night time use were fit upon discontinuing compression bandaging.  Compression/Lymphedema Equipment:  Custom flat knit compression sleeve and custom glove, 2 sets, worn and in need of replacement.  Solaris Tribute arm sleeve; Lymphapress Comfy sleeve pump.    SUBJECTIVE:   Pt arrives today with custom flat knit daytime compression garments in place stating, "If I wear my ready made sleeves, my arm will swell."   Pt states she continues with concerns regarding L breast reconstruction  healing.  Pt reports L breast implant may require further surgery to progress further healing. States she has a follow up appt with Dr. Rosie Fate in 01/19/2015. Pt reports most problematic region of lymphedema remaining L forearm.  States shoulder pain continues despite further PT at an orthopedic clinic prior to returning for lymphedema evaluation today.  Pt reports night time compression garment worn, however, did not bring to appt for reassessment.  To bring to next scheduled appt for assessment and possible replacement.  Pt states arthritic changes are a concern L hand.  Patient???s goals for therapy: Evaluation of lymphedema and replacement of daytime compression garments.    EVALUATION AND OBJECTIVE DATA SUMMARY:   Pain: L chest wall, 4/10            Skin and Tissue Assessment:  Dermal Status:  ( x)  Intact: L UE ( )  Dry   ( x)  Tenuous: Small scab mid breast L, considering further reconstruction with Dr. Rosie Fate.  Had stem cells injection in area 10/2014 per pt report. ( )  Flaky   ( )  Wound/lesion present     ( )  Dermatitis    Texture/Consistency:  (x )  Boggy:  Forearm most problematic area at this time. ( )  Pitting Edema   ( )  Brawny (x )  Implants: bilateral breast   ( x)  Fibrotic/Woody:  Forearm arm.    Pigmentation/Color Change:  ( )  Normal:   ( )  Hemosiderin   ( )  Red:  ( )  Erythematous   (x )  Hyperpigmented:  Mild L UE; L breast. 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 ( )  Vesicles   ( )  Petechiae ( )  Warty Vercusis   ( )  Bullae ( )  Papilloma   Circulatory:  ( )  ABI ( )  Varicosities:   ( x)  Pulses: Palpable radial. ( )  Vascular studies  ruled out DVT in past   ( )  DVT History    Nails:  (x )  Normal  ( )  Fungus  Stemmers Sign: negative  Weight 126 pounds: BMI 20  (36 or greater: adversely affecting lymphedema)  Volumetric Measurements:   Right: 1655.21 mL Left:  1788.16 mL   % Difference: 8.03% Dominance: Right    (See scanned graph)  Range of Motion: R shoulder flex 90, abd 90, ER 60, IR 60, with elbow, wrist, hand WFL.  L shoulder Flex 90, Abd 88, ER 60, IR 60.  Pt able to place hand behind head, behind back. All shoulder movement guarded bilaterally, L>R.  Strength: Shoulder strength bilat grossly 4/5 within available ROM, limited by pain/weakness. Remaining UE strength 4+/5.  Sensation:  Pt c/o numbness upper arm, lateral upper quadrant.  Palpation:  Note significant axillary web syndrome, palpable cording noted axilla.   Posture: Note forward head/shoulder posture noted.  Mobility:  Bed/Chair Mobility:  indep Transfers:  indep   Sitting Balance:  good Standing Balance:  good   Gait:  indep Wheelchair Mobility:  na   Endurance:  Appears intact for current level of activity, including gait community distances, working abbreviated schedule.  Pt currently on lifting restrictions s/p surgery.  Stairs:  Not assessed.       Safety:  Patient is alert and oriented:  x4   Safety awareness:  intact   Fall Risk?:  Low, use of pain meds evening hours.      Precautions:  Standard lymphedema precautions to include avoiding blood pressure readings, injections and IVs or other procedures/acts that could lead to broken skin on affected area, and avoiding excessive heat, resistive activity or altitude without compression garment    Evaluation Time:12:55-1:20 pm 25 minutes    TREATMENT PROVIDED:   1.  Treatment description:  The patient was educated regarding the role and function of the lymphatic system, and instructed in the lymphedema management protocol of complete decongestive therapy (CDT).  This includes skin care to prevent breakdown or infection, lymphedema exercises, custom compression therapy options (bandaging, compression garments, compression pump, Rosamaria Lints, JoViPak, [employer], etc. as needed), and decongestion with manual lymphatic drainage as indicated.  We discussed  the need for consistent compression system for lymphedema management, with daytime compression garments worn and in need of replacement.  Measurements completed for custom flat knit compression garments L UE, with two sets to be ordered, with gauntlet ordered also to be worn alternately with glove to determine if arthritic changes/pain improves with wear of gauntlet vs glove.  Pt advised that if lymphedema hand involvement recurs, compression glove wear is indicated.  See scanned order.  Pt to continue with day/night time compression garment wear as noted below.  Defer vasopneumatic pump use per Dr. Rosie Fate.  Compression garment routine:  1.  Apply daytime compression stocking to clean, dry extremity first thing in the morning, EVERY MORNING.    2. Wear garment until bedtime, adjusting throughout the day as needed should garment wrinkle or crease.  Problem areas can be at joint, and top of garment, ensuring proximal aspect of garment is positioned appropriately on extremity.  3. Launder compression garments nightly either by hand or washing machine in a garment bag or pillowcase.  Use mild detergent with NO FABRIC SOFTENER, NO BLEACH, NO WOOLITE.  Wash in cool/warm water.    4. Dry garment in dryer on low heat right side out in garment bag or pillowcase.  5. Daytime compression  garments are NOT safe to sleep in, so remove at bedtime.    6. Perform skin care to extremity, cleansing skin daily with soap and water, and using low pH lotion, upward strokes to stimulate lymphatic vessels.   7. Apply night time compression garment as instructed to wear while sleeping, adjusting throughout the night as needed for comfort.  8. Perform exercise program with daytime compression stockings on.  Signs/Symptoms of infection include:  Fever, flu-like symptoms, pain/redness/warmth in extremity, sometimes with increase in swelling present.  Stop wearing your compression garment if this occurs.  Call your  doctor immediately or go to the Emergency room to address potential infection.  Remove compression with changes in circulation or numbness in extremity, different from what you may experience when not wearing compression garment, pain, unexplained shortness of breath.    Treatment time:  1:21-1:44 pm  48 minutes     Manual therapy 3         ASSESSMENT:   YARELIZ THORSTENSON is a 64 y.o. female who presents with stage 2 lymphedema L UE with increased fullness forearm persistent, axillary web syndrome palpable, likely persistent due to shoulder dysfunction despite pt seeking care with orthopedist and PT in orthopedic setting.  Note L UE limb volume today over 8.03% elevated comparing with R UE, which is significantly improved from most recent visit 07/15/2015.  Patient's has undergone multiple reconstructive procedures, with further surgery likely, per pt report, to promote skin closure implant L breast.  Pt has remained compliant with day/night time compression garment wear, however, has not resumed use of vasopneumatic pump per Dr. Benjiman Core instruction s/p reconstruction.  Pt continues with bilat shoulder dysfunction, orthopedic in nature, which was present prior to breast cancer diagnosis and treatment.   Home management of lymphedema currently effective, however, daytime compression garments need to be replacement for optimal continued home management of lymphedema, with pt to bring night time garment for assessment to determine if garment requires replacement at this time.  Patient will return to clinic for fit of replacement compression garments, reorder of night time compression garment as indicated, and further education regarding phase II CDT as indicated prior to discharge to home management phase of secondary lymphedema, L UE.    This care is medically necessary due to the infection risk with lymphedema, and to improve functional activities.  CDT is necessary to  resolve swelling to allow patient to return to wearing normal clothes/footwear, and prevent worsening of symptoms, such as venous stasis ulcerations, infections, or hospitalizations.  Patient will be independent with home program strategies to allow improved ADL ability and mobility and to allow patient to return to greatest functional independence.    Rehabilitation potential is considered to be Good for goals for lymphedema management.  Patient lives over an hour from this clinic, is employed, and has a young son, which makes for a very busy schedule. Recommend 2-4 prn PT visits over the next 6-8 weeks for compression garment fit and management.  Pt verbalizes understanding of recommendations and will consider her treatment options.      PLAN OF CARE:   Recommendations and Planned Interventions:  Compression garment fitting/provision  Lymphedema therapeutic exercise  Education in skin care and lymphedema precautions  Self-MLD education per home program  Vasopneumatic pump for home use recommended by surgeon     GOALS    1.   Patient will have knowledge of the compression options and acquire a safe and   appropriate daytime and night time  compression system to prevent    re-accumulation of fluid.  12/26/2014 order placed for replacement daytime custom flat knit compression garments.  2.  Patient or family will be able to don/doff garments independently.  Garment   system effectiveness will be evaluated prior to discharge for better long-term   management and outcomes.  3.   To transition patient to independent restorative phase of CDT.           Patient has participated in goal setting and agrees to work toward plan of care.  Patient was instructed to call if questions or concerns arise.    Thank you for this referral.  Kalman Shan, PT, CLT    Time Calculation: 61 mins    TREATMENT PLAN EFFECTIVE DATES:   07/14/2014 to 12/331/2015  I have read the above plan of care for [facility].  I certify the  above prescribed services are required by this patient and are medically necessary.  The above plan of care has been developed in conjunction with the lymphedema/physical therapist.       Physician Signature: ____________________________________Date:______________

## 2015-01-27 NOTE — Telephone Encounter (Signed)
Patient called for nurse stating she is currently on the anastrozole and she is experiencing side effects and thinks she should go off of it. Call back number [phone]

## 2015-01-28 NOTE — Telephone Encounter (Signed)
Spoke to patient, who states that she has been on anastrozole for about a month or more and with-in two weeks of starting it she began to have worsening headaches and dizziness.  Patient states she normally gets Botox injections every 3 months to help with the migraines, but states that the Botox has not been helping since starting the anastrozole.  Patient states she thinks she is going to have to come off of the anastrozole.  Advised patient that per [name], to stop the anastrozole and follow-up in office with [place] as scheduled on 02/09/15, and we would see if her symptoms have improved since being off the anastrozole.  Patient voices understanding and denies any further questions at this time.

## 2015-01-28 NOTE — Telephone Encounter (Signed)
Returned patient's call.  Message left on voicemail to call office back.

## 2015-01-29 ENCOUNTER — Encounter: Primary: Family Medicine

## 2015-02-09 ENCOUNTER — Ambulatory Visit
Admit: 2015-02-09 | Discharge: 2015-02-09 | Payer: PRIVATE HEALTH INSURANCE | Attending: Acute Care | Primary: Family Medicine

## 2015-02-09 DIAGNOSIS — C50912 Malignant neoplasm of unspecified site of left female breast: Secondary | ICD-10-CM

## 2015-02-09 MED ORDER — TOREMIFENE 60 MG TABLET
60 mg | ORAL_TABLET | Freq: Every day | ORAL | Status: DC
Start: 2015-02-09 — End: 2015-07-16

## 2015-02-09 NOTE — Progress Notes (Signed)
Emily [name] is a 64 y.o. female  Chief Complaint   Patient presents with   ??? Breast Cancer

## 2015-02-09 NOTE — Patient Instructions (Signed)
See you back in 4 weeks.    Start Toremifene 60 mg daily.

## 2015-02-09 NOTE — Progress Notes (Signed)
[facility]  [address], [address]   [place], [place]   [address]  W: [phone]   F: [phone]      f/u HEME/ONC CONSULT    Reason for visit: evaluation for treatment for  Breast Cancer    Consulting physician:  Dr. Dan Humphreys, Dr. Fernanda Drum, Montez Hageman.  Ref physician:  Dr. Maxwell Marion    HPI:   Emily [name] is a 64 y.o.  female who I am seeing for adjuvant therapy for breast cancer.      She had a screening ultrasound on 09/24/12 showing a 2.6 cm left LN.  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  HER 2 negative IHC at 0.    She has a history of a pheochromocytoma surgically removed in 1997.    She then had a MRI breast on 10/05/12 showing the 2.8 x 2 cm LN as well as a left breast LIQ 0.9 cm x 1.1 cm x 0.6 cm mass.  Biopsy of that mass by FNA on 10/16/12 shows a poorly differentiated carcinoma.  These slides were air-dried, HER 2 not able to be performed on them.    Mammaprint of LN shows high risk luminal, ER + at 0.19, PR + at 0.61, HER 2 negative at -0.69.    She underwent L mastectomy on 11/28/12 showing IDC with neuroendocrine features, gr 2, 0.5 cm, DCIS gr 2-3, + LVI, 1/14 LN + with a 1 cm met, no ECE.  HER 2 negative (ratio 1.2, sig/cell 2.8), ER + 95%, PR + 5%; ki67 5%; DCIS ER + 95%, PR + 1%.    S/p TC q 3 weeks x 4 from 12/25/12-02/26/13 (75 mg/m2; 600 mg/m2)    S/p XRT from 04/02/13-05/10/13    Started anastrozole 05/17/13, stop 07/30/13 (joint pains)    exemestane 08/06/13-09/30/13 (joint pains)    Started zometa 07/10/13, q 6 months x 3 years, stopped     Started tamoxifen 09/30/13, stopped on 03/08/14 due to joint pains    Started letrozole 03/23/14, stopped 09/18/14 due to joint pains     Started tamoxifen 10/04/14, stopped on 12/15/14 due to hot flashes and mood swings.    Anastrozole 12/16/14-01/30/15, stopped due to headache and dizziness    Interval History: In today for follow up. Today complains of: gr 1 hot flashes, gr 2 insomnia, gr 1 anxiety, gr 1 headache, gr 2 loss of libido. Pt experienced debilitating headaches and dizziness on the anastrozole, relieved when she stopped the anastrozole.     LMP 1996.    DX   Encounter Diagnosis   Name Primary?   ??? Breast cancer, stage 2, left (HCC) Yes      Past Medical History   Diagnosis Date   ??? Cancer (HCC)      left breast  cancer   ??? Anxiety    ??? Arthritis    ??? Nausea & vomiting    ??? Concussion 2011   ??? Chronic tension headaches    ??? Headache(784.0)      OCCAS   ??? Other ill-defined conditions(799.89)      PHEOCHROMOCYTOMA   ??? Other and unspecified symptoms and signs involving general sensations and perceptions      HIVES RECENTLY- WAS IN ER Feb 19, 2013   ???  Lymphedema of arm      left     Past Surgical History   Procedure Laterality Date   ??? Hx orthopaedic       bilateral shoulder arthroscopy   ??? Hx modified radical mastectomy  11/28/2012     LEFT BREAST MODIFIED RADICAL MASTECTOMY WITH AXILLARY DISSECTION W/ RECONSTRUCTION LEFT BREAST WITH TISSUE EXPANDER AND ALLODERM;PORTACATH INSERTION performed by Ty Hilts, MD at [place]   ??? Hx gyn       c-section   ??? Hx oophorectomy       right   ??? Hx heent       T&A   ??? Hx heent       right adrenal removed   ??? Pr abdomen surgery proc unlisted       hernia mesh repair   ??? Hx appendectomy     ??? Pr abdomen surgery proc unlisted       right adenal removed   ??? Hx vascular access       PORT, THEN REMOVED   ??? Pr breast surgery procedure unlisted  '91     breast implants; replaced bilat implants 2011   ??? Hx breast reconstruction  11/28/2012     BREAST RECONSTRUCTION /C  INSERTION EXPANDER & ALLODERM performed by Sharlotte Alamo, MD at [place]   ??? Hx mastectomy Left     ??? Hx breast reconstruction Bilateral 09/16/2013     REMOVAL AND REPLACEMENT LEFT BREAST IMPLANT TISSUE EXPANDERS WITH SUBTOTAL CAPSULECTOMY, RIGHT BREAST IMPLANT REMOVAL AND REPLACEMENT FOR SYMMETRY/FAT GRAFTING TO BILATERAL BREASTS performed by Sharlotte Alamo, MD at [place]   ??? Hx breast augmentation Left 04/22/2014     REMOVAL OF LEFT BREAST IMPLANT, WASHOUT AND CLOSURE OF LEFT BREAST WOUND performed by Sharlotte Alamo, MD at [place]     History     Social History   ??? Marital Status: SINGLE     Spouse Name: N/A   ??? Number of Children: N/A   ??? Years of Education: N/A     Social History Main Topics   ??? Smoking status: Never Smoker    ??? Smokeless tobacco: Never Used   ??? Alcohol Use: No   ??? Drug Use: No   ??? Sexual Activity: Not on file     Other Topics Concern   ??? None     Social History Narrative     Family History   Problem Relation Age of Onset   ??? Alzheimer Mother    ??? Diabetes Mother    ??? Other Mother      COMBATIVE POST ANESTHESIA   ??? Heart Disease Father    ??? Diabetes Father    ??? Lung Disease Father      COPD   ??? Headache Sister    ??? Migraines Sister    ??? Anesth Problems Neg Hx        Current Outpatient Prescriptions   Medication Sig Dispense Refill   ??? toremifene 60 mg tab Take 60 mg by mouth daily. 30 Tab 5   ??? BOTOX 200 unit injection      ??? diazepam (VALIUM) 5 mg tablet TAKE 1 TABLET EVERY 6 HOURS AS NEEDED 30 Tab 2   ??? anastrozole (ARIMIDEX) 1 mg tablet Take 1 Tab by mouth daily. 30 Tab 3   ??? sertraline (ZOLOFT) 100 mg tablet TAKE 1 TAB BY MOUTH DAILY. 30 Tab 3   ??? DYMISTA  137-50 mcg/spray spry      ??? gabapentin (NEURONTIN) 100 mg capsule   36   ??? PROCTOSOL HC 2.5 % rectal cream   0   ??? lidocaine (LIDODERM) 5 %(700 mg/patch)   2   ??? oxyCODONE IR (ROXICODONE) 5 mg immediate release tablet   0   ??? butalbital-acetaminophen-caffeine (FIORICET, ESGIC) 50-325-40 mg per tablet Take 1 Tab by mouth every four (4) hours as needed for Headache. Max Daily Amount: 6 Tabs. 30 Tab 0    ??? spironolactone (ALDACTONE) 25 mg tablet Take 1 Tab by mouth daily. 30 Tab 3   ??? oxyCODONE-acetaminophen (PERCOCET) 5-325 mg per tablet Take 2 tablets by mouth every four (4) hours as needed. 80 tablet 0       Allergies   Allergen Reactions   ??? Aspirin Unknown (comments)     Gi bleeding   ??? Compazine [Prochlorperazine] Rash   ??? Keflex [Cephalexin] Rash   ??? Levaquin [Levofloxacin] Hives     DENIES ALLERGY   ??? Nsaids (Non-Steroidal Anti-Inflammatory Drug) Swelling     Throat swells   ??? Prednisone Rash     DENIES ALLERGY       Review of Systems    A comprehensive review of systems was performed and all systems were negative except for HPI and for the symptom report form, reviewed and scanned in.      Objective:  Physical Exam:  BP 115/60 mmHg   Pulse 66   Temp(Src) 98 ??F (36.7 ??C) (Temporal)   Resp 16   Ht $R'5\' 6"'Uf$  (1.676 m)   Wt 129 lb (58.514 kg)   BMI 20.83 kg/m2   SpO2 98%    General:  Alert, cooperative, no distress, appears stated age.   Head:  Normocephalic, without obvious abnormality, atraumatic.   Eyes:  Conjunctivae/corneas clear. PERRL, EOMs intact.   Throat: Lips, mucosa, and tongue normal.    Neck: Supple, symmetrical, trachea midline, no adenopathy, thyroid: no enlargement/tenderness/nodules   Back:   Symmetric, no curvature. ROM normal. No CVA tenderness.   Lungs:   Clear to auscultation bilaterally.   Chest wall:  S/p bilateral mastectomy with reconstruction.  <1cm cyst palpable at lower inner aspect of L breast at the edge of implant, tender to palpation.  Surgical wound to central L breast with 2 cm area of eschar.     Heart:  Regular rate and rhythm, S1, S2 normal, no murmur, click, rub or gallop.   Abdomen:   Soft, non-tender. Bowel sounds normal. No masses,  No organomegaly.   Extremities: Trace lymphedema to L arm, sleeve on today..  Extremities normal, atraumatic, no cyanosis.   Skin: No rash   Lymph nodes: Cervical, supraclavicular, and axillary nodes normal.   Neurologic: CNII-XII intact.        Diagnostic Imaging   10/12/12 PET:  Negative for distant mets  10/22/12 brain MRI:  Negative    09/24/12 dexa L hip T -2.5; L spine T -2.3  osteoporosis    12/11/13 bilateral MRI  Negative    03/26/14 MRI Brain  Negative    12/05/14 MRI breast   Negative    12/05/14 [place] L breast?? ????  IMPRESSION:  1. Very small, hypoechoic lesion in continuity and immediately below the ??  dermis overlying the sternum/medialmost left breast. Finding suggests dermal ??  lesion such as epidermal inclusion cyst versus postoperative change.  BI-RADS Category 2 - Benign findings. ??    Lab Results  Lab Results   Component  Value Date/Time    WBC 4.7 09/09/2013 10:19 AM    HGB 12.2 09/09/2013 10:19 AM    HCT 37.0 09/09/2013 10:19 AM    PLATELET 253 09/09/2013 10:19 AM    MCV 88.7 09/09/2013 10:19 AM       Lab Results   Component Value Date/Time    SODIUM 144 02/05/2013 09:44 AM    POTASSIUM 4.4 02/05/2013 09:44 AM    CHLORIDE 105 02/05/2013 09:44 AM    CO2 29 02/05/2013 09:44 AM    ANION GAP 10 02/05/2013 09:44 AM    GLUCOSE 101 02/05/2013 09:44 AM    BUN 14 02/05/2013 09:44 AM    CREATININE 0.32 02/05/2013 09:44 AM    BUN/CREATININE RATIO 44 02/05/2013 09:44 AM    GFR EST AA >60 02/05/2013 09:44 AM    GFR EST NON-AA >60 02/05/2013 09:44 AM    CALCIUM 8.8 02/05/2013 09:44 AM    ALT 52 02/05/2013 09:44 AM    AST 22 02/05/2013 09:44 AM    ALK. PHOSPHATASE 136 02/05/2013 09:44 AM    PROTEIN, TOTAL 6.2 02/05/2013 09:44 AM    ALBUMIN 3.8 02/05/2013 09:44 AM    GLOBULIN 2.4 02/05/2013 09:44 AM    A-G RATIO 1.6 02/05/2013 09:44 AM     Assessment/Plan:  63 y.o. female with a 0.5 cm left breast cancer, IDC with endocrine features, 1/14 LN +, gr 2, high risk mammaprint,  ER and PR +, HER 2 negative. PS 0    1. Breast cancer with neuroendocrine features, stage IIA, T1bN1aMx    With no further therapy, her RR is about 36%; endocrine therapy will decrease her RR to 19%; and 2nd gen chemo will decrease her RR to 12%; 3rd  gen to 10%.  These #s may be higher with her high risk mammaprint.     Based on her pheo and now breast cancer history, I recommended genetic counseling -- referral to [facility] made at prior visit. Has seen them and they have performed BRCA testing -- negative.  [facility] has run a PGL panel on 09/10/14, will obtain results    No evidence of recurrence.      MRI breast 12/05/14 negative.   Due for imaging in March 2017, MRI breast.  Patient is unable to tolerate mammogram due to pain.     Anastrozole started 12/16/14 and stopped 01/30/15 due to headaches and dizziness.     The risks and benefits of toremifene were discussed in detail and the patient was informed of the following: Risks include a 1% risk of endometrial cancer for postmenopausal women treated for five years but no (or a minimally increased) risk in premenopausal women and that most women who develop toremifene-associated endometrial cancer can be cured. Any bleeding in a postmenopausal woman should be reported to a health care professional. There is also a 1% risk of blood clots (thromboembolism) that can be fatal. All patients irrespective of age who take toremifene and who have not had a hysterectomy should have a pelvic exam and Pap smear yearly. Toremifene increases the risk of cataract formation and on rare occasions has caused retinal damage: an eye exam is recommended yearly. Other risks include vaginal discharge or dryness, the development or worsening of hot flashes or vasomotor symptoms, and bone loss in premenopausal women. There is excellent evidence that toremifene does not increase risk of depression, cause weight gain or have a major effect on sexual function. Available data suggests little or no effect on cognitive function.  Benefits include a lowering of cholesterol and a reduction in the rate of bone loss for postmenopausal woman. Any other symptoms should be reported.     Will try Toremifene 60 mg daily, rx send today. If this is too expensive, we will try Exemestane. Patient has noted to [place] that her co-pay is $35.    Will see her back in 4 weeks for close follow up.     2. Anxiety/Depression: Stable today; continue sertraline 100 mg daily.   Valium prn.  She has seen Renee Ramus previously.      3. Osteoporosis: Had received zometa previously, which was started 07/10/13. Switched to prolia 60 mg SC q 6 months, received 10/01/14.  Next appointment in [facility] on 03/10/15.    DEXA 10/01/14 at [facility], patient reports improvement.      Vit D3 2000 International Units daily, not currently taking.    4. Joint pains: also has RA; Significantly worse with letrozole, improved on tamoxifen however unable to tolerate.      5. Fibromyalgia/RA:  Not currently on any treatment, discussed with patient that there are medical options to assist with her pain.  She will follow up with her Rheumatologist.  She took MTX prior to cancer diagnosis which she did not tolerate.  Previously did not tolerate lyrica due to sedation.    6. Headaches:  Worsened with anastrozole, improved when she stopped anastrozole, 12/11/13 Brain MRI negative, following with Neurology, has received Botox injections previously.    7. Lump in L breast: MRI breast 12/05/14 negative.  [place] on 12/05/14 suggests epidermal inclusion cyst vs postoperative changes.  She is following with Dr. Verl Blalock monthly who has suggested revision of reconstruction to L breast, next appointment 02/18/15.      Follow-up Disposition:  Return in about 4 weeks (around 03/10/2015) for Emily [name], prolia.    Sonda Rumble, MD

## 2015-02-09 NOTE — Telephone Encounter (Signed)
Pt called and stated that the pharmacy order has been processed, the co-pay is $35.00 and is being sent to her.

## 2015-02-26 ENCOUNTER — Inpatient Hospital Stay: Admit: 2015-02-26 | Payer: BLUE CROSS/BLUE SHIELD | Primary: Family Medicine

## 2015-02-26 DIAGNOSIS — I890 Lymphedema, not elsewhere classified: Secondary | ICD-10-CM

## 2015-02-26 NOTE — Progress Notes (Signed)
_                                    [facility]  [address]  [place]  [address]      LYmphedema Therapy  Visit: 2            Daily note                      30 day/10th visit progress note    NAME: Emily [name]  DATE: 02/26/2015    GOALS    1.???? Patient will have knowledge of the compression options and acquire a safe and ????????????????????????????????????appropriate daytime and night time compression system to prevent ????????????????????????????????????????????????????????????re-accumulation of fluid.?? 12/26/2014 order placed for replacement daytime custom flat knit compression garments.  2.?? Patient or family will be able to don/doff garments independently.?? Garment ????????????????????????????????????system effectiveness will be evaluated prior to discharge for better long-term ??????????????????????????management and outcomes.  3.???? To transition patient to independent restorative phase of CDT.         SUBJECTIVE REPORT:   Pain:   Gait:?? indep  ADLs:?? indep  Treatment Response:?? Limb volume measurements stable, remaining in 15% discrepancy range.?? Measurements completed for second set of daytime compression garments.  Reviewed packet received at evaluation.  Function: Continues with L shoulder dysfunction limiting all at and above shoulder level activities.  Began MLD (manual lymphatic drainage) techniques with lotioning and skin care and range of motion exercise routine without props.    TREATMENT AND OBJECTIVE DATA SUMMARY:   Patient/Family Education:      Educated in skin care: Reviewed skin care principles     Educated in exercise:   Instructed in exercise routine using the gertie ball.     Instructed in self MLD:   Written sequence given and reviewed with patient as well as demonstration and instruction during MLD portion of the session.     Instructed in don/doff of compression system:   Multi layer bandage (MLB) donning principles and wear precautions.          Therapeutic Exercise/Procedure  minutes   Treatment time:   Gertie ball exercise program: Demonstration by therapist of written routine.  Patient performed each x5 reps.   Stick exercise program: N/A   Free exercises/ROM: N/A   Home program: Patient to perform daily to BID skin care, deep abdominal breathing, exercise routine.   Rationale: Exercise will increase the lymph angiomotoricity and tissue pressure of the skin and thus decrease swelling.         Manual Lymphatic Drainage (MLD) 75 minutes   Treatment time: 12:40-1:55 pm  Area to decongest:    Sequence used and effectiveness:                   Skin/wound care/debridement: All exposed intact            Upper/Lower extremity compression: Day Time   Custom Arm sleeve (Juzo Expert 3021), gauntlet and gloves are arrived and patient fitting in clinic  Good fit noted for arm sleeve with no changes to be made- patient left clinic with new sleeves (beige and black)    Gauntlet: (Juzo 3021) in beige: too long at MTP at 4th and 5th digit.  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 In addition, the zipper was ordered to be on the palmer side, it arrived on the lateral border of the glove.  These 2 adjustments were corrected for new order.    While in clinic, patient observed another patient with a Medi 550 with lymph pad at dorsum of the hand- patient liked the color (fusica) and that it was a glove and sleeve combo.  Patient was interested at next fitting to try the Medi 550    Patient also interested in [place] print series Soft Series 2001 ; colors  Lace [place] or [place]: she is attending her son's wedding in August.  Patient to look at [employer] online once return to home, return call  for which color she would like to purchase    Night Time Garment:  Patient re-measured for assessment of night time garment. Patient reports the garment slides down at the top border, last order was 03/28/15 of last year. She has 2-3 cm differences in volume in the upper arm, so she would benefit from a new night time compression garment.  [employer] Tribute  With [employer]: Color-Pink garment order placed in addition to the correction in Juzo garments listed above                       Kinesiotaping: n/a   Girth/Volume measurement:      TOTAL TREATMENT  75 minutes     Circumferential Limb Measurements:      ASSESSMENT:   Treatment effectiveness and tolerance: Adjustments and corrections made to [employer] and glove order.  Patient now needing a new night time garment, re measured and order placed for this.  She will return when new day and night time garments have arrived to ensure proper fit.  Patient to continue with all CDT exercises, skin care regimen and wear schedule.    Progress toward goals:      PLAN OF CARE:   Changes to the plan of care: n/a   Frequency: 1-2 visits to ensure proper fit   Other:        Manus Gunning, PT, DPT,CLT  75 minutes

## 2015-03-05 MED ORDER — DENOSUMAB 60 MG/ML SUB-Q SYRINGE
60 mg/mL | Freq: Once | SUBCUTANEOUS | Status: AC
Start: 2015-03-05 — End: 2015-03-10
  Administered 2015-03-10: 18:00:00 via SUBCUTANEOUS

## 2015-03-09 ENCOUNTER — Encounter: Attending: Specialist | Primary: Family Medicine

## 2015-03-09 MED ORDER — SERTRALINE 100 MG TAB
100 mg | ORAL_TABLET | ORAL | Status: DC
Start: 2015-03-09 — End: 2015-06-12

## 2015-03-10 ENCOUNTER — Inpatient Hospital Stay: Admit: 2015-03-10 | Payer: BLUE CROSS/BLUE SHIELD | Primary: Family Medicine

## 2015-03-10 ENCOUNTER — Encounter: Attending: Acute Care | Primary: Family Medicine

## 2015-03-10 DIAGNOSIS — M810 Age-related osteoporosis without current pathological fracture: Secondary | ICD-10-CM

## 2015-03-10 LAB — MAGNESIUM: Magnesium: 2 mg/dL (ref 1.6–2.4)

## 2015-03-10 LAB — POC CHEM8
Anion gap (POC): 19 mmol/L — ABNORMAL HIGH (ref 5–15)
BUN (POC): 14 MG/DL (ref 9–20)
CO2 (POC): 25 MMOL/L (ref 21–32)
Calcium, ionized (POC): 1.19 MMOL/L (ref 1.12–1.32)
Chloride (POC): 99 MMOL/L (ref 98–107)
Creatinine (POC): 0.5 MG/DL — ABNORMAL LOW (ref 0.6–1.3)
GFRAA, POC: >60 mL/min/{1.73_m2} (ref >60)
GFRNA, POC: >60 mL/min/{1.73_m2} (ref >60)
Glucose (POC): 85 MG/DL (ref 65–105)
Hematocrit (POC): 37 % (ref 35.0–47.0)
Hemoglobin (POC): 12.6 GM/DL (ref 11.5–16.0)
Potassium (POC): 4.1 MMOL/L (ref 3.5–5.1)
Sodium (POC): 137 MMOL/L (ref 136–145)

## 2015-03-10 LAB — PHOSPHORUS: Phosphorus: 3.7 MG/DL (ref 2.6–4.7)

## 2015-03-10 MED FILL — PROLIA 60 MG/ML SUBCUTANEOUS SYRINGE: 60 mg/mL | SUBCUTANEOUS | Qty: 1

## 2015-03-10 NOTE — Progress Notes (Signed)
[facility] Nursing Progress Note:    1300:  Patient arrived ambulatory, for scheduled prolia injection. She has had this previously and understands what to expect.  No new issues or concern.    Recent Results (from the past 12 hour(s))   MAGNESIUM    Collection Time: 03/10/15  1:17 PM   Result Value Ref Range    Magnesium 2.0 1.6 - 2.4 mg/dL   PHOSPHORUS    Collection Time: 03/10/15  1:17 PM   Result Value Ref Range    Phosphorus 3.7 2.6 - 4.7 MG/DL   POC CHEM8    Collection Time: 03/10/15  1:23 PM   Result Value Ref Range    Calcium, ionized (POC) 1.19 1.12 - 1.32 MMOL/L    Sodium (POC) 137 136 - 145 MMOL/L    Potassium (POC) 4.1 3.5 - 5.1 MMOL/L    Chloride (POC) 99 98 - 107 MMOL/L    CO2 (POC) 25 21 - 32 MMOL/L    Anion gap (POC) 19 (H) 5 - 15 mmol/L    Glucose (POC) 85 65 - 105 MG/DL    BUN (POC) 14 9 - 20 MG/DL    Creatinine (POC) 0.5 (L) 0.6 - 1.3 MG/DL    GFR-AA (POC) >60 >60 ml/min/1.71m2    GFR, non-AA (POC) >60 >60 ml/min/1.49m2    Hemoglobin (POC) 12.6 11.5 - 16.0 GM/DL    Hematocrit (POC) 37 35.0 - 47.0 %    Comment Notified RN or MD immediately by operator       Pt tolerated injection well; has next prolia appointment in 6 months on 09/08/15.    1340:  pt left ambulatory, in no distress.

## 2015-03-23 ENCOUNTER — Ambulatory Visit
Admit: 2015-03-23 | Discharge: 2015-03-23 | Payer: PRIVATE HEALTH INSURANCE | Attending: Acute Care | Primary: Family Medicine

## 2015-03-23 ENCOUNTER — Ambulatory Visit
Admit: 2015-03-23 | Discharge: 2015-03-23 | Payer: PRIVATE HEALTH INSURANCE | Attending: Surgery | Primary: Family Medicine

## 2015-03-23 DIAGNOSIS — C50912 Malignant neoplasm of unspecified site of left female breast: Secondary | ICD-10-CM

## 2015-03-23 MED ORDER — DIAZEPAM 5 MG TAB
5 mg | ORAL_TABLET | Freq: Four times a day (QID) | ORAL | Status: DC | PRN
Start: 2015-03-23 — End: 2015-09-17

## 2015-03-23 NOTE — Progress Notes (Deleted)
HISTORY OF PRESENT ILLNESS  Emily [name] is a 64 y.o. female.  HPI   ESTABLISHED patient here today for LEFT breast cancer follow up. She reports LEFT axilla webbing and is wearing her LEFT arm lymphedema sleeve. Reports some swelling to LEFT arm. Has compression pump, but is unable to use it due to poor healing of LEFT breast incision. Had final reconstruction surgery in 04/2014 and reports having small opening at incision that Dr. Verl Blalock has been watching. She has mole on LEFT arm that she is worried about, but has appointment with dermatologist soon. She is currently taking Toremifene for endocrine therapy and tolerating well. She is requesting a refill for valium. Denies any other breast problems at this time.     History of breast cancer-  LEFT breast stage 2 IDC, Er/Pr positive, Her2 negative, DCIS. mammaprint high risk. 1/4 positive LN.   11/2012- LEFT mastectomy with reconstruction with [name].   Completed adjuvant chemotherapy- TC q 3 weeks x 4 from 12/25/12-02/26/13   Completed XRT from 04/02/13-05/10/13  09/2013- removal and replacement of implant  Started anastrozole 05/17/13, stop 07/30/13 (joint pains)  exemestane 08/06/13-09/30/13 (joint pains)  Started zometa 07/10/13, q 6 months x 3 years, stopped   Started tamoxifen 09/30/13, stopped on 03/08/14 due to joint pains  Started letrozole 03/23/14, stopped 09/18/14 due to joint pains  04/2014- final reconstruction with Dr. Verl Blalock  Started tamoxifen 10/04/14, stopped on 12/15/14 due to hot flashes and mood swings.  Anastrozole 12/16/14-01/30/15, stopped due to headache and dizziness  toremefine 02/09/15- present. Followed by [name]    Recent imaging-  Breast MRI on 12/05/14- BIRADS 2    ROS    Physical Exam    ASSESSMENT and PLAN  {ASSESSMENT/PLAN:19072}

## 2015-03-23 NOTE — Progress Notes (Signed)
Emily [name] is a 64 y.o. female    Chief Complaint   Patient presents with   ??? Breast Cancer

## 2015-03-23 NOTE — Progress Notes (Signed)
[facility]  [address], [address]   [place], [place]   [address]  W: [phone]   F: [phone]      f/u HEME/ONC CONSULT    Reason for visit: evaluation for treatment for  Breast Cancer    Consulting physician:  Dr. Dan Humphreys, Dr. Fernanda Drum, Montez Hageman.  Ref physician:  Dr. Maxwell Marion    HPI:   Emily [name] is a 64 y.o.  female who I am seeing for adjuvant therapy for breast cancer.      She had a screening ultrasound on 09/24/12 showing a 2.6 cm left LN.  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  HER 2 negative IHC at 0.    She has a history of a pheochromocytoma surgically removed in 1997.    She then had a MRI breast on 10/05/12 showing the 2.8 x 2 cm LN as well as a left breast LIQ 0.9 cm x 1.1 cm x 0.6 cm mass.  Biopsy of that mass by FNA on 10/16/12 shows a poorly differentiated carcinoma.  These slides were air-dried, HER 2 not able to be performed on them.    Mammaprint of LN shows high risk luminal, ER + at 0.19, PR + at 0.61, HER 2 negative at -0.69.    She underwent L mastectomy on 11/28/12 showing IDC with neuroendocrine features, gr 2, 0.5 cm, DCIS gr 2-3, + LVI, 1/14 LN + with a 1 cm met, no ECE.  HER 2 negative (ratio 1.2, sig/cell 2.8), ER + 95%, PR + 5%; ki67 5%; DCIS ER + 95%, PR + 1%.    S/p TC q 3 weeks x 4 from 12/25/12-02/26/13 (75 mg/m2; 600 mg/m2)    S/p XRT from 04/02/13-05/10/13    Started anastrozole 05/17/13, stop 07/30/13 (joint pains)    exemestane 08/06/13-09/30/13 (joint pains)    Started zometa 07/10/13, q 6 months x 3 years, stopped     Started tamoxifen 09/30/13, stopped on 03/08/14 due to joint pains    Started letrozole 03/23/14, stopped 09/18/14 due to joint pains     Started tamoxifen 10/04/14, stopped on 12/15/14 due to hot flashes and mood swings.    Anastrozole 12/16/14-01/30/15, stopped due to headache and dizziness    toremefine 02/09/15-    Interval History: complains of gr 1 constipation, gr 1 fatigue, gr 1 hot flashes, gr 1 insomnia, gr 1 anxiety, gr 1 headache     LMP 1996.    DX   Encounter Diagnosis   Name Primary?   ??? Breast cancer, stage 2, left (HCC) Yes      Past Medical History   Diagnosis Date   ??? Cancer (HCC)      left breast  cancer   ??? Anxiety    ??? Arthritis    ??? Nausea & vomiting    ??? Concussion 2011   ??? Chronic tension headaches    ??? Headache(784.0)      OCCAS   ??? Other ill-defined conditions(799.89)      PHEOCHROMOCYTOMA   ??? Other and unspecified symptoms and signs involving general sensations and perceptions      HIVES RECENTLY- WAS IN ER Feb 19, 2013   ??? Lymphedema of arm      left  Past Surgical History   Procedure Laterality Date   ??? Hx orthopaedic       bilateral shoulder arthroscopy   ??? Hx modified radical mastectomy  11/28/2012     LEFT BREAST MODIFIED RADICAL MASTECTOMY WITH AXILLARY DISSECTION W/ RECONSTRUCTION LEFT BREAST WITH TISSUE EXPANDER AND ALLODERM;PORTACATH INSERTION performed by Ty Hilts, MD at [facility]   ??? Hx gyn       c-section   ??? Hx oophorectomy       right   ??? Hx heent       T&A   ??? Hx heent       right adrenal removed   ??? Pr abdomen surgery proc unlisted       hernia mesh repair   ??? Hx appendectomy     ??? Pr abdomen surgery proc unlisted       right adenal removed   ??? Hx vascular access       PORT, THEN REMOVED   ??? Pr breast surgery procedure unlisted  '91     breast implants; replaced bilat implants 2011   ??? Hx breast reconstruction  11/28/2012     BREAST RECONSTRUCTION /C  INSERTION EXPANDER & ALLODERM performed by Sharlotte Alamo, MD at [facility]   ??? Hx mastectomy Left    ??? Hx breast reconstruction Bilateral 09/16/2013     REMOVAL AND REPLACEMENT LEFT BREAST IMPLANT TISSUE EXPANDERS WITH  SUBTOTAL CAPSULECTOMY, RIGHT BREAST IMPLANT REMOVAL AND REPLACEMENT FOR SYMMETRY/FAT GRAFTING TO BILATERAL BREASTS performed by Sharlotte Alamo, MD at [place]   ??? Hx breast augmentation Left 04/22/2014     REMOVAL OF LEFT BREAST IMPLANT, WASHOUT AND CLOSURE OF LEFT BREAST WOUND performed by Sharlotte Alamo, MD at [facility]     History     Social History   ??? Marital Status: SINGLE     Spouse Name: N/A   ??? Number of Children: N/A   ??? Years of Education: N/A     Social History Main Topics   ??? Smoking status: Never Smoker    ??? Smokeless tobacco: Never Used   ??? Alcohol Use: No   ??? Drug Use: No   ??? Sexual Activity: Not on file     Other Topics Concern   ??? None     Social History Narrative     Family History   Problem Relation Age of Onset   ??? Alzheimer Mother    ??? Diabetes Mother    ??? Other Mother      COMBATIVE POST ANESTHESIA   ??? Heart Disease Father    ??? Diabetes Father    ??? Lung Disease Father      COPD   ??? Headache Sister    ??? Migraines Sister    ??? Anesth Problems Neg Hx        Current Outpatient Prescriptions   Medication Sig Dispense Refill   ??? sertraline (ZOLOFT) 100 mg tablet TAKE 1 TAB BY MOUTH DAILY. 30 Tab 3   ??? toremifene 60 mg tab Take 60 mg by mouth daily. 30 Tab 5   ??? lidocaine (LIDODERM) 5 %(700 mg/patch)   2   ??? BOTOX 200 unit injection      ??? Butalbital-Acetaminophen-Caff 50-300-40 mg cap   3   ??? diazepam (VALIUM) 5 mg tablet TAKE 1 TABLET EVERY 6 HOURS AS NEEDED 30 Tab 2   ??? DYMISTA 137-50 mcg/spray spry      ???  oxyCODONE IR (ROXICODONE) 5 mg immediate release tablet   0   ??? butalbital-acetaminophen-caffeine (FIORICET, ESGIC) 50-325-40 mg per tablet Take 1 Tab by mouth every four (4) hours as needed for Headache. Max Daily Amount: 6 Tabs. 30 Tab 0       Allergies   Allergen Reactions   ??? Aspirin Unknown (comments)     Gi bleeding   ??? Compazine [Prochlorperazine] Rash   ??? Keflex [Cephalexin] Rash   ??? Levaquin [Levofloxacin] Hives     DENIES ALLERGY    ??? Nsaids (Non-Steroidal Anti-Inflammatory Drug) Swelling     Throat swells   ??? Prednisone Rash     DENIES ALLERGY       Review of Systems    A comprehensive review of systems was performed and all systems were negative except for HPI and for the symptom report form, reviewed and scanned in.      Objective:  Physical Exam:  BP 117/71 mmHg   Pulse 71   Temp(Src) 97.6 ??F (36.4 ??C) (Temporal)   Resp 18   Ht $R'5\' 6"'bu$  (1.676 m)   Wt 128 lb 3.2 oz (58.151 kg)   BMI 20.70 kg/m2   SpO2 98%    General:  Alert, cooperative, no distress, appears stated age.   Head:  Normocephalic, without obvious abnormality, atraumatic.   Eyes:  Conjunctivae/corneas clear. PERRL, EOMs intact.   Throat: Lips, mucosa, and tongue normal.    Neck: Supple, symmetrical, trachea midline, no adenopathy, thyroid: no enlargement/tenderness/nodules   Back:   Symmetric, no curvature. ROM normal. No CVA tenderness.   Lungs:   Clear to auscultation bilaterally.   Chest wall:  S/p bilateral mastectomy with reconstruction.    Heart:  Regular rate and rhythm, S1, S2 normal, no murmur, click, rub or gallop.   Abdomen:   Soft, non-tender. Bowel sounds normal. No masses,  No organomegaly.   Extremities: Trace lymphedema to L arm, sleeve on today..  Extremities normal, atraumatic, no cyanosis.   Skin: No rash   Lymph nodes: Cervical, supraclavicular, and axillary nodes normal.   Neurologic: CNII-XII intact.       Diagnostic Imaging   10/12/12 PET:  Negative for distant mets  10/22/12 brain MRI:  Negative    09/24/12 dexa L hip T -2.5; L spine T -2.3  osteoporosis    12/11/13 bilateral MRI  Negative    03/26/14 MRI Brain  Negative    12/05/14 MRI breast   Negative    12/05/14 [place] L breast?? ????  IMPRESSION:  1. Very small, hypoechoic lesion in continuity and immediately below the ??  dermis overlying the sternum/medialmost left breast. Finding suggests dermal ??  lesion such as epidermal inclusion cyst versus postoperative change.  BI-RADS Category 2 - Benign findings. ??     Lab Results  Lab Results   Component Value Date/Time    WBC 4.7 09/09/2013 10:19 AM    HGB 12.2 09/09/2013 10:19 AM    HCT 37.0 09/09/2013 10:19 AM    PLATELET 253 09/09/2013 10:19 AM    MCV 88.7 09/09/2013 10:19 AM       Lab Results   Component Value Date/Time    SODIUM 144 02/05/2013 09:44 AM    POTASSIUM 4.4 02/05/2013 09:44 AM    CHLORIDE 105 02/05/2013 09:44 AM    CO2 29 02/05/2013 09:44 AM    ANION GAP 10 02/05/2013 09:44 AM    GLUCOSE 101 02/05/2013 09:44 AM    BUN 14 02/05/2013 09:44 AM  CREATININE 0.32 02/05/2013 09:44 AM    BUN/CREATININE RATIO 44 02/05/2013 09:44 AM    GFR EST AA >60 02/05/2013 09:44 AM    GFR EST NON-AA >60 02/05/2013 09:44 AM    CALCIUM 8.8 02/05/2013 09:44 AM    ALT 52 02/05/2013 09:44 AM    AST 22 02/05/2013 09:44 AM    ALK. PHOSPHATASE 136 02/05/2013 09:44 AM    PROTEIN, TOTAL 6.2 02/05/2013 09:44 AM    ALBUMIN 3.8 02/05/2013 09:44 AM    GLOBULIN 2.4 02/05/2013 09:44 AM    A-G RATIO 1.6 02/05/2013 09:44 AM     Assessment/Plan:  64 y.o. female with a 0.5 cm left breast cancer, IDC with endocrine features, 1/14 LN +, gr 2, high risk mammaprint,  ER and PR +, HER 2 negative. PS 0    1. Breast cancer with neuroendocrine features, stage IIA, T1bN1aMx    With no further therapy, her RR is about 36%; endocrine therapy will decrease her RR to 19%; and 2nd gen chemo will decrease her RR to 12%; 3rd gen to 10%.  These #s may be higher with her high risk mammaprint.     Based on her pheo and now breast cancer history, I recommended genetic counseling -- referral to [facility] made at prior visit. Has seen them and they have performed BRCA testing -- negative.  [facility] has run a PGL panel on 09/10/14, will obtain results    No evidence of recurrence.      MRI breast 12/05/14 negative.   Due for imaging in March 2017, MRI breast.  Patient is unable to tolerate mammogram due to pain.     Will see her back in 4 weeks for close follow up.      2. Anxiety/Depression: Stable today; continue sertraline 100 mg daily.   Valium prn.  She has seen Deloris Ping previously.      3. Osteoporosis: Had received zometa previously, which was started 07/10/13. Switched to prolia 60 mg SC q 6 months, received 10/01/14, 03/10/15.      DEXA 10/01/14 at [facility], patient reports improvement.      Vit D3 2000 International Units daily, not currently taking.    4. Joint pains: improved on toremifene    5. Fibromyalgia/RA:  Not currently on any treatment, discussed with patient that there are medical options to assist with her pain.  She will follow up with her Rheumatologist.  She took MTX prior to cancer diagnosis which she did not tolerate.  Previously did not tolerate lyrica due to sedation.    6. Headaches:  Worsened with anastrozole, improved when she stopped anastrozole, 12/11/13 Brain MRI negative, following with Neurology, has received Botox injections previously.    7. Lump in L breast: MRI breast 12/05/14 negative.  [place] on 12/05/14 suggests epidermal inclusion cyst vs postoperative changes.  She is following with Dr. Rosie Fate monthly who has suggested revision of reconstruction to L breast      Follow-up Disposition:  Return in about 3 months (around 06/23/2015).    Dortha Kern, MD

## 2015-03-23 NOTE — Patient Instructions (Signed)
Preventing Lymphedema After Treatment for Breast Cancer: Care Instructions  Your Care Instructions     Lymphedema is a buildup of fluid in the soft tissues of the body. It can happen in the arm after breast cancer surgery to remove lymph nodes. If there are few or no lymph nodes, fluid can build up in the arm. It can also happen if the lymph system in an arm has been damaged. Infection, tumors, and scar tissue from radiation therapy to the armpit area also can cause fluid to build up.  You may be able to avoid lymphedema or keep it under control by following the tips below. Make sure that you take good care of the skin on your arm and hand. Your skin acts as a barrier to keep out bacteria and prevent infection. It is also important not to overuse the muscles in the arm. And don't expose your arm to very hot or cold temperatures.  Lymphedema can happen soon after breast cancer treatment. Or it may happen many years later. It may affect only part of your arm or hand. In some cases, it affects all of the arm. Make sure to follow these precautions even after you finish treatment. Do not ignore tightness or swelling in or around your arm or hand. You are less likely to have long-term problems if you get these symptoms treated right away.  Follow-up care is a key part of your treatment and safety. Be sure to make and go to all appointments, and call your doctor if you are having problems. It's also a good idea to know your test results and keep a list of the medicines you take.  How can you care for yourself at home?  Skin care  ?? Keep your arm, hand, and armpit clean. Use a mild soap that does not dry out your skin.  ?? Moisturize your skin often.  ?? Take good care of the skin around your fingernails. Do not bite or cut your cuticles.  ?? Ask your doctor how to handle any cuts, scratches, insect bites, or other injuries you may get.  ?? Use sunscreen and insect repellent outdoors to protect your skin from  sunburn and insect bites.  ?? Use an electric razor to shave your armpit. It's less likely to cut or irritate your skin.  Activity  ?? Don't wear clothing or jewelry that is tight on your arm or hand. Your doctor may advise you not to wear a watch or rings on the affected hand.  ?? Wear gloves when you do activities that could hurt the skin on your fingers or hand. Wear them when you garden, do yard work, wash dishes, and clean with chemicals. Use oven mitts when you handle hot food.  ?? Do not have blood drawn from the arm on the side of the lymph node surgery. Do not get injections (shots) or have an IV put in the affected arm.  ?? Do not allow a blood pressure cuff to be placed on that arm. If you are in the hospital, make sure you tell your nurse and other hospital staff about your condition.  ?? Do not expose your arm to very hot or very cold temperatures. For example, do not use hot tubs, saunas, or steam rooms. Do not use a heating pad or cold pack on that arm or shoulder.  ?? Rest your arm often when you do repeated movements, such as vacuum, scrub, or mop.  ?? Try to use your other arm   to carry heavy things, such as grocery bags. Avoid shoulder straps when you carry a briefcase or purse. Carry your purse on your good arm.  ?? Ask your doctor about wearing a compression sleeve and glove (gauntlet). Your doctor may want you to wear these when you exercise or when you fly in an airplane. They can help keep fluid from pooling in your arm and hand.  Exercise  ?? Ask your doctor about exercises for your arm and hand. Your doctor may recommend that you see a physical therapist. This person can teach you how to do self-massage to move fluid out of your arm.  ?? Check with your doctor before you start exercises that use the arm. This includes tennis, rowing, or weight lifting. Your doctor can help you find an activity level that is right for you.  When should you call for help?   Call your doctor now or seek immediate medical care if:  ?? You have signs of infection, such as:  ?? Increased pain, swelling, warmth, or redness in your arm or hand.  ?? Red streaks leading from the area of lymph node surgery or radiation.  ?? Pus draining from a cut or scrape in your skin on your arm or hand.  ?? A fever.  Watch closely for changes in your health, and be sure to contact your doctor if:  ?? You have a feeling of tightness or swelling in or around your arm or hand.  ?? You have pain, aching, weakness, or a "pins and needles" feeling in your arm or hand.  ?? Your rings, watches, or bracelets feel tight, but you have not gained weight.  ?? You notice that one arm looks larger than the other.  ?? You cannot bend your fingers, wrist, or elbow as much as usual.  Where can you learn more?  Go to http://www.healthwise.net/[employer]Help[employer]ctions  Enter G546 in the search box to learn more about "Preventing Lymphedema After Treatment for Breast Cancer: Care Instructions."  ?? 2006-2016 Healthwise[employer] Inc[employer]. Care instructions adapted under license by [employer] (which disclaims liability or warranty for this information). This care instruction is for use with your licensed healthcare professional. If you have questions about a medical condition or this instruction, always ask your healthcare professional. Healthwise, Incorporated disclaims any warranty or liability for your use of this information.  Content Version: 10.9.538570; Current as of: August 22, 2014

## 2015-03-23 NOTE — Progress Notes (Signed)
HISTORY OF PRESENT ILLNESS  Emily [name] is a 64 y.o. female.  HPI  ESTABLISHED patient here today for LEFT breast cancer follow up. She reports LEFT axilla webbing and is wearing her LEFT arm lymphedema sleeve. Reports some swelling to LEFT arm. Has compression pump, but is unable to use it due to poor healing of LEFT breast incision. Had final reconstruction surgery in 04/2014 and reports having small opening at incision that Dr. Verl Blalock has been watching. She has mole on LEFT arm that she is worried about, but has appointment with dermatologist soon. She is currently taking Toremifene for endocrine therapy and tolerating well. She is requesting a refill for valium. Denies any other breast problems at this time.     History of breast cancer-  LEFT breast stage 2 IDC, Er/Pr positive, Her2 negative, DCIS. mammaprint high risk. 1/4 positive LN.   11/2012- LEFT mastectomy with reconstruction with [name].   Completed adjuvant chemotherapy- TC q 3 weeks x 4 from 12/25/12-02/26/13   Completed XRT from 04/02/13-05/10/13  09/2013- removal and replacement of implant  Started anastrozole 05/17/13, stop 07/30/13 (joint pains)  exemestane 08/06/13-09/30/13 (joint pains)  Started zometa 07/10/13, q 6 months x 3 years, stopped   Started tamoxifen 09/30/13, stopped on 03/08/14 due to joint pains  Started letrozole 03/23/14, stopped 09/18/14 due to joint pains  04/2014- final reconstruction with Dr. Verl Blalock  Started tamoxifen 10/04/14, stopped on 12/15/14 due to hot flashes and mood swings.  Anastrozole 12/16/14-01/30/15, stopped due to headache and dizziness  toremefine 02/09/15- present. Followed by [name]    Recent imaging-   Breast MRI on 12/05/14- BIRADS 2    Past Medical History   Diagnosis Date   ??? Cancer ([facility])      left breast  cancer   ??? Anxiety    ??? Arthritis    ??? Nausea & vomiting    ??? Concussion 2011   ??? Chronic tension headaches    ??? Headache(784.0)      OCCAS   ??? Other ill-defined conditions(799.89)      PHEOCHROMOCYTOMA    ??? Other and unspecified symptoms and signs involving general sensations and perceptions      HIVES RECENTLY- WAS IN ER Feb 19, 2013   ??? Lymphedema of arm      left       Past Surgical History   Procedure Laterality Date   ??? Hx orthopaedic       bilateral shoulder arthroscopy   ??? Hx modified radical mastectomy  11/28/2012     LEFT BREAST MODIFIED RADICAL MASTECTOMY WITH AXILLARY DISSECTION W/ RECONSTRUCTION LEFT BREAST WITH TISSUE EXPANDER AND ALLODERM;PORTACATH INSERTION performed by Ty Hilts, MD at [facility]   ??? Hx gyn       c-section   ??? Hx oophorectomy       right   ??? Hx heent       T&A   ??? Hx heent       right adrenal removed   ??? Pr abdomen surgery proc unlisted       hernia mesh repair   ??? Hx appendectomy     ??? Pr abdomen surgery proc unlisted       right adenal removed   ??? Hx vascular access       PORT, THEN REMOVED   ??? Pr breast surgery procedure unlisted  '91     breast implants; replaced bilat implants 2011   ??? Hx breast reconstruction  11/28/2012     BREAST RECONSTRUCTION /C  INSERTION EXPANDER & ALLODERM performed by Sharlotte Alamo, MD at [facility]   ??? Hx mastectomy Left    ??? Hx breast reconstruction Bilateral 09/16/2013     REMOVAL AND REPLACEMENT LEFT BREAST IMPLANT TISSUE EXPANDERS WITH SUBTOTAL CAPSULECTOMY, RIGHT BREAST IMPLANT REMOVAL AND REPLACEMENT FOR SYMMETRY/FAT GRAFTING TO BILATERAL BREASTS performed by Sharlotte Alamo, MD at [facility]   ??? Hx breast augmentation Left 04/22/2014     REMOVAL OF LEFT BREAST IMPLANT, WASHOUT AND CLOSURE OF LEFT BREAST WOUND performed by Sharlotte Alamo, MD at [facility]       History     Social History   ??? Marital Status: SINGLE     Spouse Name: N/A   ??? Number of Children: N/A   ??? Years of Education: N/A     Occupational History   ??? Not on file.     Social History Main Topics   ??? Smoking status: Never Smoker    ??? Smokeless tobacco: Never Used   ??? Alcohol Use: No   ??? Drug Use: No   ??? Sexual Activity: Not on file     Other Topics Concern    ??? Not on file     Social History Narrative       Current Outpatient Prescriptions on File Prior to Visit   Medication Sig Dispense Refill   ??? sertraline (ZOLOFT) 100 mg tablet TAKE 1 TAB BY MOUTH DAILY. 30 Tab 3   ??? toremifene 60 mg tab Take 60 mg by mouth daily. 30 Tab 5   ??? DYMISTA 137-50 mcg/spray spry      ??? BOTOX 200 unit injection      ??? lidocaine (LIDODERM) 5 %(700 mg/patch)   2   ??? oxyCODONE IR (ROXICODONE) 5 mg immediate release tablet   0   ??? butalbital-acetaminophen-caffeine (FIORICET, ESGIC) 50-325-40 mg per tablet Take 1 Tab by mouth every four (4) hours as needed for Headache. Max Daily Amount: 6 Tabs. 30 Tab 0     No current facility-administered medications on file prior to visit.       Allergies   Allergen Reactions   ??? Aspirin Unknown (comments)     Gi bleeding   ??? Compazine [Prochlorperazine] Rash   ??? Keflex [Cephalexin] Rash   ??? Levaquin [Levofloxacin] Hives     DENIES ALLERGY   ??? Nsaids (Non-Steroidal Anti-Inflammatory Drug) Swelling     Throat swells   ??? Prednisone Rash     DENIES ALLERGY       OB History     Obstetric Comments    Menarche:  67.   LMP: 57.  # of Children:  2.  Age at Delivery of First Child:  48.   Hysterectomy/oophorectomy:  NO/NO.  Breast Bx:  no.  Hx of Breast Feeding:  yes.  BCP:  no. Hormone therapy:  Yes '98 to 12/13.          ROS  Constitutional: Negative    HENT: Negative.   Eyes: Negative.    Respiratory: Negative.   Cardiovascular: Negative.   Gastrointestinal: Negative.   Genitourinary: Negative.   Musculoskeletal: Negative.   Skin: Negative.   Neurological: Negative.   Endo/Heme/Allergies: Negative.   Psychiatric/Behavioral: Negative.    Physical Exam   Cardiovascular: Normal rate and normal heart sounds.    Pulmonary/Chest: Breath sounds normal. Right breast exhibits no inverted nipple, no mass, no nipple discharge, no skin change  and no tenderness. Left breast exhibits no inverted nipple, no mass, no nipple discharge, no  skin change and no tenderness. Breasts are symmetrical.       Lymphadenopathy:        Right cervical: No superficial cervical, no deep cervical and no posterior cervical adenopathy present.       Left cervical: No superficial cervical, no deep cervical and no posterior cervical adenopathy present.        Right axillary: No pectoral and no lateral adenopathy present.        Left axillary: No pectoral and no lateral adenopathy present.      ASSESSMENT and PLAN    ICD-10-CM ICD-9-CM    1. Breast cancer, stage 2, left (HCC) C50.912 174.9      Pt with hx of LEFT breast cancer, and doing well. Recent imaging showed BIRADs 2. Will refill Valium rx. Will refer physical therapist for limited mobility in LEFT arm. Pt will fu with me in 1 year. This plan was reviewed with the patient and patient agrees. All questions were answered.    Written by Carilyn Goodpasture, [employer], as dictated by Dr. Sandford Craze, MD.

## 2015-03-24 NOTE — Communication Body (Signed)
HISTORY OF PRESENT ILLNESS  Emily [name] is a 64 y.o. female.  HPI  ESTABLISHED patient here today for LEFT breast cancer follow up. She reports LEFT axilla webbing and is wearing her LEFT arm lymphedema sleeve. Reports some swelling to LEFT arm. Has compression pump, but is unable to use it due to poor healing of LEFT breast incision. Had final reconstruction surgery in 04/2014 and reports having small opening at incision that Dr. Blanchet has been watching. She has mole on LEFT arm that she is worried about, but has appointment with dermatologist soon. She is currently taking Toremifene for endocrine therapy and tolerating well. She is requesting a refill for valium. Denies any other breast problems at this time.     History of breast cancer-  LEFT breast stage 2 IDC, Er/Pr positive, Her2 negative, DCIS. mammaprint high risk. 1/4 positive LN.   11/2012- LEFT mastectomy with reconstruction with Dr. Chen.   Completed adjuvant chemotherapy- TC q 3 weeks x 4 from 12/25/12-02/26/13   Completed XRT from 04/02/13-05/10/13  09/2013- removal and replacement of implant  Started anastrozole 05/17/13, stop 07/30/13 (joint pains)  exemestane 08/06/13-09/30/13 (joint pains)  Started zometa 07/10/13, q 6 months x 3 years, stopped   Started tamoxifen 09/30/13, stopped on 03/08/14 due to joint pains  Started letrozole 03/23/14, stopped 09/18/14 due to joint pains  04/2014- final reconstruction with Dr. Blanchet  Started tamoxifen 10/04/14, stopped on 12/15/14 due to hot flashes and mood swings.  Anastrozole 12/16/14-01/30/15, stopped due to headache and dizziness  toremefine 02/09/15- present. Followed by Dr. Irvin    Recent imaging-   Breast MRI on 12/05/14- BIRADS 2    Past Medical History   Diagnosis Date   ??? Cancer (HCC)      left breast  cancer   ??? Anxiety    ??? Arthritis    ??? Nausea & vomiting    ??? Concussion 2011   ??? Chronic tension headaches    ??? Headache(784.0)      OCCAS   ??? Other ill-defined conditions(799.89)      PHEOCHROMOCYTOMA    ??? Other and unspecified symptoms and signs involving general sensations and perceptions      HIVES RECENTLY- WAS IN ER Feb 19, 2013   ??? Lymphedema of arm      left       Past Surgical History   Procedure Laterality Date   ??? Hx orthopaedic       bilateral shoulder arthroscopy   ??? Hx modified radical mastectomy  11/28/2012     LEFT BREAST MODIFIED RADICAL MASTECTOMY WITH AXILLARY DISSECTION W/ RECONSTRUCTION LEFT BREAST WITH TISSUE EXPANDER AND ALLODERM;PORTACATH INSERTION performed by Antony Sian Pellicane Jr V, MD at [facility]   ??? Hx gyn       c-section   ??? Hx oophorectomy       right   ??? Hx heent       T&A   ??? Hx heent       right adrenal removed   ??? Pr abdomen surgery proc unlisted       hernia mesh repair   ??? Hx appendectomy     ??? Pr abdomen surgery proc unlisted       right adenal removed   ??? Hx vascular access       PORT, THEN REMOVED   ??? Pr breast surgery procedure unlisted  '91     breast implants; replaced bilat implants 2011   ??? Hx breast reconstruction    11/28/2012     BREAST RECONSTRUCTION /C  INSERTION EXPANDER & ALLODERM performed by Stephen M Chen, MD at [facility]   ??? Hx mastectomy Left    ??? Hx breast reconstruction Bilateral 09/16/2013     REMOVAL AND REPLACEMENT LEFT BREAST IMPLANT TISSUE EXPANDERS WITH SUBTOTAL CAPSULECTOMY, RIGHT BREAST IMPLANT REMOVAL AND REPLACEMENT FOR SYMMETRY/FAT GRAFTING TO BILATERAL BREASTS performed by Stephen M Chen, MD at [facility]   ??? Hx breast augmentation Left 04/22/2014     REMOVAL OF LEFT BREAST IMPLANT, WASHOUT AND CLOSURE OF LEFT BREAST WOUND performed by Stephen M Chen, MD at [facility]       History     Social History   ??? Marital Status: SINGLE     Spouse Name: N/A   ??? Number of Children: N/A   ??? Years of Education: N/A     Occupational History   ??? Not on file.     Social History Main Topics   ??? Smoking status: Never Smoker    ??? Smokeless tobacco: Never Used   ??? Alcohol Use: No   ??? Drug Use: No   ??? Sexual Activity: Not on file     Other Topics Concern    ??? Not on file     Social History Narrative       Current Outpatient Prescriptions on File Prior to Visit   Medication Sig Dispense Refill   ??? sertraline (ZOLOFT) 100 mg tablet TAKE 1 TAB BY MOUTH DAILY. 30 Tab 3   ??? toremifene 60 mg tab Take 60 mg by mouth daily. 30 Tab 5   ??? DYMISTA 137-50 mcg/spray spry      ??? BOTOX 200 unit injection      ??? lidocaine (LIDODERM) 5 %(700 mg/patch)   2   ??? oxyCODONE IR (ROXICODONE) 5 mg immediate release tablet   0   ??? butalbital-acetaminophen-caffeine (FIORICET, ESGIC) 50-325-40 mg per tablet Take 1 Tab by mouth every four (4) hours as needed for Headache. Max Daily Amount: 6 Tabs. 30 Tab 0     No current facility-administered medications on file prior to visit.       Allergies   Allergen Reactions   ??? Aspirin Unknown (comments)     Gi bleeding   ??? Compazine [Prochlorperazine] Rash   ??? Keflex [Cephalexin] Rash   ??? Levaquin [Levofloxacin] Hives     DENIES ALLERGY   ??? Nsaids (Non-Steroidal Anti-Inflammatory Drug) Swelling     Throat swells   ??? Prednisone Rash     DENIES ALLERGY       OB History     Obstetric Comments    Menarche:  16.   LMP: 57.  # of Children:  2.  Age at Delivery of First Child:  33.   Hysterectomy/oophorectomy:  NO/NO.  Breast Bx:  no.  Hx of Breast Feeding:  yes.  BCP:  no. Hormone therapy:  Yes '98 to 12/13.          ROS  Constitutional: Negative    HENT: Negative.   Eyes: Negative.    Respiratory: Negative.   Cardiovascular: Negative.   Gastrointestinal: Negative.   Genitourinary: Negative.   Musculoskeletal: Negative.   Skin: Negative.   Neurological: Negative.   Endo/Heme/Allergies: Negative.   Psychiatric/Behavioral: Negative.    Physical Exam   Cardiovascular: Normal rate and normal heart sounds.    Pulmonary/Chest: Breath sounds normal. Right breast exhibits no inverted nipple, no mass, no nipple discharge, no skin change   and no tenderness. Left breast exhibits no inverted nipple, no mass, no nipple discharge, no  skin change and no tenderness. Breasts are symmetrical.       Lymphadenopathy:        Right cervical: No superficial cervical, no deep cervical and no posterior cervical adenopathy present.       Left cervical: No superficial cervical, no deep cervical and no posterior cervical adenopathy present.        Right axillary: No pectoral and no lateral adenopathy present.        Left axillary: No pectoral and no lateral adenopathy present.      ASSESSMENT and PLAN    ICD-10-CM ICD-9-CM    1. Breast cancer, stage 2, left (HCC) C50.912 174.9      Pt with hx of LEFT breast cancer, and doing well. Recent imaging showed BIRADs 2. Will refill Valium rx. Will refer physical therapist for limited mobility in LEFT arm. Pt will fu with me in 1 year. This plan was reviewed with the patient and patient agrees. All questions were answered.    Written by Jessica Wunsch, [employer], as dictated by Dr. Jayleah Garbers Pellicane, MD.

## 2015-04-27 MED ORDER — SERTRALINE 100 MG TAB
100 mg | ORAL_TABLET | ORAL | Status: DC
Start: 2015-04-27 — End: 2018-03-28

## 2015-04-27 NOTE — Telephone Encounter (Signed)
Call from pt, ID verified x2. Pt complains of RUQ pain, she rated pain a 9 out of 10. States that she has had gall bladder issues in the past and was supposed to have it removed a few years ago, but did not. Per verbal consult with [name], pt to go to ED for further evaluation. Pt expressed understanding with no further questions or concerns.

## 2015-06-13 MED ORDER — SERTRALINE 100 MG TAB
100 mg | ORAL_TABLET | ORAL | 3 refills | Status: DC
Start: 2015-06-13 — End: 2015-07-24

## 2015-06-15 ENCOUNTER — Encounter: Attending: Internal Medicine | Primary: Family Medicine

## 2015-06-15 NOTE — Telephone Encounter (Signed)
Pt had np appt with you today, she has a lot of prob and was advised to sche with you. She had to cx her appt for today as her child was sick with fever. She is asking if we can fit her in for another NP appt before December , Please advise. Thank you!     She has had pheocronophoyona surgery, breast cancer, adised by dr Fortunato Curling to see you, Rheum arth. Crissie Sickles also recommended you as well.

## 2015-06-19 NOTE — Telephone Encounter (Signed)
Patient called and stated that she has been experiencing what feels like muscle spasms up her back and arms and it's making her feel weak and fatigued. She stated that what she is experiencing is hard to explain, but it's similar to the muscle spasms she had when she was on Newlasta, and she is wondering if she should be seen by [name] or get some blood work done. Emily [name]'s CB# is [phone].

## 2015-06-19 NOTE — Telephone Encounter (Signed)
Called the patient and left a message on her voicemail to call Dr. Valinda Hoar office back at her earliest convenience and that if it is after hours, she can still call the office number and speak to the on-call doctor.

## 2015-06-22 ENCOUNTER — Encounter: Attending: Specialist | Primary: Family Medicine

## 2015-06-23 NOTE — Telephone Encounter (Signed)
Patient called and stated that she got an automated call reminding her of her apt tomorrow, 9/21 but she had no recollection of making that apt and she cannot make it. She rescheduled to Monday, 9/26 at 9:00AM.

## 2015-06-24 ENCOUNTER — Encounter: Attending: Specialist | Primary: Family Medicine

## 2015-06-29 ENCOUNTER — Ambulatory Visit
Admit: 2015-06-29 | Discharge: 2015-06-29 | Payer: PRIVATE HEALTH INSURANCE | Attending: Specialist | Primary: Family Medicine

## 2015-06-29 DIAGNOSIS — C50912 Malignant neoplasm of unspecified site of left female breast: Secondary | ICD-10-CM

## 2015-06-29 NOTE — Progress Notes (Signed)
Emily [name] is a 63 y.o. female  Chief Complaint   Patient presents with   ??? Breast Cancer

## 2015-06-29 NOTE — Progress Notes (Signed)
[facility]  [address] [place]   [address]  W: [phone]   F: [phone]      f/u HEME/ONC CONSULT    Reason for visit: evaluation for treatment for  Breast Cancer    Consulting physician:  Dr. Gilford Rile, Dr. Raynald Blend, Brooke Bonito.  Ref physician:  Dr. Drucilla Schmidt    HPI:   Emily [name] is a 64 y.o.  female who I am seeing for adjuvant therapy for breast cancer.      She had a screening ultrasound on 09/24/12 showing a 2.6 cm left LN.  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  HER 2 negative IHC at 0.    She has a history of a pheochromocytoma surgically removed in 1997.    She then had a MRI breast on 10/05/12 showing the 2.8 x 2 cm LN as well as a left breast LIQ 0.9 cm x 1.1 cm x 0.6 cm mass.  Biopsy of that mass by FNA on 10/16/12 shows a poorly differentiated carcinoma.  These slides were air-dried, HER 2 not able to be performed on them.    Mammaprint of LN shows high risk luminal, ER + at 0.19, PR + at 0.61, HER 2 negative at -0.69.    She underwent L mastectomy on 11/28/12 showing IDC with neuroendocrine features, gr 2, 0.5 cm, DCIS gr 2-3, + LVI, 1/14 LN + with a 1 cm met, no ECE.  HER 2 negative (ratio 1.2, sig/cell 2.8), ER + 95%, PR + 5%; ki67 5%; DCIS ER + 95%, PR + 1%.    S/p TC q 3 weeks x 4 from 12/25/12-02/26/13 (75 mg/m2; 600 mg/m2)    S/p XRT from 04/02/13-05/10/13    Started anastrozole 05/17/13, stop 07/30/13 (joint pains)    exemestane 08/06/13-09/30/13 (joint pains)    Started zometa 07/10/13, q 6 months x 3 years, stopped     Started tamoxifen 09/30/13, stopped on 03/08/14 due to joint pains    Started letrozole 03/23/14, stopped 09/18/14 due to joint pains     Started tamoxifen 10/04/14, stopped on 12/15/14 due to hot flashes and mood swings.    Anastrozole 12/16/14-01/30/15, stopped due to headache and dizziness    toremefine 02/09/15-    Interval History: Continue toremefine, tolerating well. complains of gr 1 appetite, gr 1 constipation, gr 1 fatigue, gr 1 hot flashes, gr 1 insomnia, gr 1 anxiety, gr 1 mouth sores, gr 1 headache, gr 1 headache, gr 1 libido. She has been having dry mouth and dry eye. She is having some jaw pain since having a crown placed. She has been having some intermittent muscle spasms that come and go.    LMP 1996.    DX   Encounter Diagnosis   Name Primary?   ??? Breast cancer, stage 2, left ([place]) Yes      Past Medical History   Diagnosis Date   ??? Anxiety    ??? Arthritis    ??? Cancer ([place])      left breast  cancer   ??? Chronic tension headaches    ??? Concussion 2011   ??? Headache(784.0)      OCCAS   ??? Lymphedema of arm      left   ???  Nausea & vomiting    ??? Other and unspecified symptoms and signs involving general sensations and perceptions      HIVES RECENTLY- WAS IN ER Feb 19, 2013   ??? Other ill-defined conditions(799.89)      PHEOCHROMOCYTOMA     Past Surgical History   Procedure Laterality Date   ??? Hx orthopaedic       bilateral shoulder arthroscopy   ??? Hx modified radical mastectomy  11/28/2012     LEFT BREAST MODIFIED RADICAL MASTECTOMY WITH AXILLARY DISSECTION W/ RECONSTRUCTION LEFT BREAST WITH TISSUE EXPANDER AND ALLODERM;PORTACATH INSERTION performed by Ty Hilts, MD at [facility]   ??? Hx gyn       c-section   ??? Hx oophorectomy       right   ??? Hx heent       T&A   ??? Hx heent       right adrenal removed   ??? Pr abdomen surgery proc unlisted       hernia mesh repair   ??? Hx appendectomy     ??? Pr abdomen surgery proc unlisted       right adenal removed   ??? Hx vascular access       PORT, THEN REMOVED   ??? Pr breast surgery procedure unlisted  '91     breast implants; replaced bilat implants 2011   ??? Hx breast reconstruction  11/28/2012      BREAST RECONSTRUCTION /C  INSERTION EXPANDER & ALLODERM performed by Sharlotte Alamo, MD at [facility]   ??? Hx mastectomy Left    ??? Hx breast reconstruction Bilateral 09/16/2013     REMOVAL AND REPLACEMENT LEFT BREAST IMPLANT TISSUE EXPANDERS WITH SUBTOTAL CAPSULECTOMY, RIGHT BREAST IMPLANT REMOVAL AND REPLACEMENT FOR SYMMETRY/FAT GRAFTING TO BILATERAL BREASTS performed by Sharlotte Alamo, MD at [facility]   ??? Hx breast augmentation Left 04/22/2014     REMOVAL OF LEFT BREAST IMPLANT, WASHOUT AND CLOSURE OF LEFT BREAST WOUND performed by Sharlotte Alamo, MD at [facility]     Social History     Social History   ??? Marital status: SINGLE     Spouse name: N/A   ??? Number of children: N/A   ??? Years of education: N/A     Social History Main Topics   ??? Smoking status: Never Smoker   ??? Smokeless tobacco: Never Used   ??? Alcohol use No   ??? Drug use: No   ??? Sexual activity: Not Asked     Other Topics Concern   ??? None     Social History Narrative     Family History   Problem Relation Age of Onset   ??? Alzheimer Mother    ??? Diabetes Mother    ??? Other Mother      COMBATIVE POST ANESTHESIA   ??? Heart Disease Father    ??? Diabetes Father    ??? Lung Disease Father      COPD   ??? Headache Sister    ??? Migraines Sister    ??? Anesth Problems Neg Hx        Current Outpatient Prescriptions   Medication Sig Dispense Refill   ??? sertraline (ZOLOFT) 100 mg tablet TAKE 1 TAB BY MOUTH DAILY. 30 Tab 3   ??? diazepam (VALIUM) 5 mg tablet Take 1 Tab by mouth every six (6) hours as needed for Anxiety. Max Daily Amount: 20 mg. Indications: MUSCLE SPASM 30 Tab 4   ???  toremifene 60 mg tab Take 60 mg by mouth daily. 30 Tab 5   ??? DYMISTA 137-50 mcg/spray spry      ??? lidocaine (LIDODERM) 5 %(700 mg/patch)   2   ??? oxyCODONE IR (ROXICODONE) 5 mg immediate release tablet   0   ??? butalbital-acetaminophen-caffeine (FIORICET, ESGIC) 50-325-40 mg per tablet Take 1 Tab by mouth every four (4) hours as needed for Headache. Max Daily Amount: 6 Tabs. 30 Tab 0    ??? sertraline (ZOLOFT) 100 mg tablet TAKE 1 TAB BY MOUTH DAILY. 30 Tab 3   ??? Butalbital-Acetaminophen-Caff 50-300-40 mg cap   3   ??? BOTOX 200 unit injection          Allergies   Allergen Reactions   ??? Aspirin Unknown (comments)     Gi bleeding   ??? Compazine [Prochlorperazine] Rash   ??? Keflex [Cephalexin] Rash   ??? Levaquin [Levofloxacin] Hives     DENIES ALLERGY   ??? Nsaids (Non-Steroidal Anti-Inflammatory Drug) Swelling     Throat swells   ??? Prednisone Rash     DENIES ALLERGY       Review of Systems    A comprehensive review of systems was performed and all systems were negative except for HPI and for the symptom report form, reviewed and scanned in.      Objective:  Physical Exam:  Visit Vitals   ??? BP 109/68 (BP 1 Location: Right arm, BP Patient Position: Sitting)   ??? Pulse 64   ??? Temp 96.4 ??F (35.8 ??C) (Temporal)   ??? Resp 18   ??? Ht _0  (1.676 m)   ??? Wt 120 lb 9.6 oz (54.7 kg)   ??? SpO2 97%   ??? BMI 19.47 kg/m2       General:  Alert, cooperative, no distress, appears stated age.   Head:  Normocephalic, without obvious abnormality, atraumatic.   Eyes:  Conjunctivae/corneas clear. PERRL, EOMs intact.   Throat: Lips, mucosa, and tongue normal.    Neck: Supple, symmetrical, trachea midline, no adenopathy, thyroid: no enlargement/tenderness/nodules   Back:   Symmetric, no curvature. ROM normal. No CVA tenderness.   Lungs:   Clear to auscultation bilaterally.   Chest wall:  S/p bilateral mastectomy with reconstruction.    Heart:  Regular rate and rhythm, S1, S2 normal, no murmur, click, rub or gallop.   Abdomen:   Soft, non-tender. Bowel sounds normal. No masses,  No organomegaly.   Extremities:  Extremities normal, atraumatic, no cyanosis.   Skin: No rash   Lymph nodes: Cervical, supraclavicular, and axillary nodes normal.   Neurologic: CNII-XII intact.       Diagnostic Imaging   10/12/12 PET:  Negative for distant mets  10/22/12 brain MRI:  Negative    09/24/12 dexa L hip T -2.5; L spine T -2.3  osteoporosis     12/11/13 bilateral MRI  Negative    03/26/14 MRI Brain  Negative    12/05/14 MRI breast   Negative    12/05/14 [place] L breast?? ????  IMPRESSION:  1. Very small, hypoechoic lesion in continuity and immediately below the ??  dermis overlying the sternum/medialmost left breast. Finding suggests dermal ??  lesion such as epidermal inclusion cyst versus postoperative change.  BI-RADS Category 2 - Benign findings. ??    Lab Results  Lab Results   Component Value Date/Time    WBC 4.7 09/09/2013 10:19 AM    HGB 12.2 09/09/2013 10:19 AM    HCT 37.0 09/09/2013 10:19  AM    PLATELET 253 09/09/2013 10:19 AM    MCV 88.7 09/09/2013 10:19 AM       Lab Results   Component Value Date/Time    SODIUM 144 02/05/2013 09:44 AM    POTASSIUM 4.4 02/05/2013 09:44 AM    CHLORIDE 105 02/05/2013 09:44 AM    CO2 29 02/05/2013 09:44 AM    ANION GAP 10 02/05/2013 09:44 AM    GLUCOSE 101 02/05/2013 09:44 AM    BUN 14 02/05/2013 09:44 AM    CREATININE 0.32 02/05/2013 09:44 AM    BUN/CREATININE RATIO 44 02/05/2013 09:44 AM    GFR EST AA >60 02/05/2013 09:44 AM    GFR EST NON-AA >60 02/05/2013 09:44 AM    CALCIUM 8.8 02/05/2013 09:44 AM    ALT 52 02/05/2013 09:44 AM    AST 22 02/05/2013 09:44 AM    ALK. PHOSPHATASE 136 02/05/2013 09:44 AM    PROTEIN, TOTAL 6.2 02/05/2013 09:44 AM    ALBUMIN 3.8 02/05/2013 09:44 AM    GLOBULIN 2.4 02/05/2013 09:44 AM    A-G RATIO 1.6 02/05/2013 09:44 AM     Assessment/Plan:  64 y.o. female with a 0.5 cm left breast cancer, IDC with endocrine features, 1/14 LN +, gr 2, high risk mammaprint,  ER and PR +, HER 2 negative. PS 0    1. Breast cancer with neuroendocrine features, stage IIA, [id number]    With no further therapy, her RR is about 36%; endocrine therapy will decrease her RR to 19%; and 2nd gen chemo will decrease her RR to 12%; 3rd gen to 10%.  These #s may be higher with her high risk mammaprint.     Based on her pheo and now breast cancer history, I recommended genetic  counseling -- referral to [facility] made at prior visit. Has seen them and they have performed BRCA testing -- negative.  [facility] has run a PGL panel on 09/10/14.    No evidence of recurrence, continue toremifene.       Last eye exam: 06/2015  Last gyn exam: 06/2015    Due for imaging in March 2017, MRI breast, ordered today. Patient is unable to tolerate mammogram due to pain.     Will see her back in 3 months.     2. Anxiety/Depression: Stable today; continue sertraline 100 mg daily.   Valium prn.  She has seen Deloris Ping previously.      3. Osteoporosis: Had received zometa previously, which was started 07/10/13. Switched to prolia 60 mg SC q 6 months, received 10/01/14, 03/10/15.      DEXA 10/01/14 at the [facility], patient reports improvement.      Vit D3 2000 International Units daily, not currently taking.    4. Joint pains: improved on toremifene    5. Fibromyalgia/RA:  Not currently on any treatment, discussed with patient that there are medical options to assist with her pain.  She will follow up with her Rheumatologist.  She took MTX prior to cancer diagnosis which she did not tolerate.  Previously did not tolerate lyrica due to sedation.    6. Headaches: improved on toremifene, 12/11/13 Brain MRI negative, following with Neurology, has received Botox injections previously.    7. Lump in L breast: MRI breast 12/05/14 negative. [place] on 12/05/14 suggests epidermal inclusion cyst vs postoperative changes. She will see Dr. Rosie Fate on 07/01/15.     8. Dry Eye: likely from toremifene. Opthalmology following    9. Jaw Pain: since  having crown placed in Dec 2015. Will have her follow up with dentist before her next prolia dose.     10. Muscle spasm, back: intermittent, will monitor; if it persists or worsens, she will let us know      Follow-up Disposition:  Return in about 3 months (around 09/16/2015) for 36mfu, sand/Diara Chaudhari .    WSonda Rumble MD

## 2015-06-29 NOTE — Patient Instructions (Addendum)
Come see us in 3 months.

## 2015-07-07 NOTE — Telephone Encounter (Signed)
Called the patient and the patient stated that her son has been living with pain for three years and has had multiple scans that have been negative and just recently went to see a doctor at 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 knew an orthopedic oncologist in [place] and/or a vascular surgeon.  Informed the patient that [name] will be made aware that she is looking for someone in [place] and this office will call back with any recommendations.  The patient verbalized understanding and denied any further questions or concerns.

## 2015-07-07 NOTE — Telephone Encounter (Signed)
Patient called stating that her son who is in his 1's was just diagnosed with "an issue" that was discovered on a CT scan he had done. They are recommending he see an orthopedic oncologist. The problem is, is that he lives in [place] and he cannot get an appointment for a couple of weeks, so she is wondering if [name] can recommend an orthopedic oncologist for him to see that he may be able to get in with sooner. Emily [name] is worried and wanted me to let [name] know that his request is urgent. Emily [name]'s CB# is [phone]. Thanks.

## 2015-07-08 NOTE — Telephone Encounter (Signed)
Called the patient and informed her that [name] recommended Dr. Donnal Debar with [facility] and that his number is [phone].  The patient verbalized understanding and denied any further questions or concerns.

## 2015-07-09 MED ORDER — SERTRALINE 100 MG TAB
100 mg | ORAL_TABLET | ORAL | 3 refills | Status: DC
Start: 2015-07-09 — End: 2015-07-24

## 2015-07-13 ENCOUNTER — Encounter: Attending: Surgery | Primary: Family Medicine

## 2015-07-15 ENCOUNTER — Inpatient Hospital Stay: Primary: Family Medicine

## 2015-07-15 ENCOUNTER — Encounter

## 2015-07-16 ENCOUNTER — Telehealth

## 2015-07-16 MED ORDER — TOREMIFENE 60 MG TABLET
60 mg | ORAL_TABLET | Freq: Every day | ORAL | 5 refills | Status: DC
Start: 2015-07-16 — End: 2015-12-23

## 2015-07-16 NOTE — Telephone Encounter (Signed)
Patient left VM and stated that she needs a refill on her Fareston 60mg . She said [facility] told [facility] that [facility]ed [facility] a request but it did not go through and she only has a couple of days left. Please [facility] [address]. If needed, [facility] phone number is [phone]. Thanks.

## 2015-07-22 ENCOUNTER — Inpatient Hospital Stay: Admit: 2015-07-22 | Primary: Family Medicine

## 2015-07-24 ENCOUNTER — Ambulatory Visit
Admit: 2015-07-24 | Discharge: 2015-07-24 | Payer: PRIVATE HEALTH INSURANCE | Attending: Surgery | Primary: Family Medicine

## 2015-07-24 DIAGNOSIS — C50312 Malignant neoplasm of lower-inner quadrant of left female breast: Secondary | ICD-10-CM

## 2015-07-24 NOTE — Communication Body (Signed)
HISTORY OF PRESENT ILLNESS  Emily [name] is a 64 y.o. female.  HPI  ESTABLISHED patient here for follow up breast cancer.The patient has several concerns regarding palpable lumps that she has in both breasts. In the LEFT breast she has a hard palpable lump under the breast and a string of lumps along the outer edge of the LEFT breast. She also has a palpable lump in her RIGHT axilla area. She is wearing a compression sleeve on the LEFT arm. She continues to use a compression pump.  The patient states she has healed from her reconstruction and is scheduled to have fat grafting in her LEFT breast 09/03/2015. She states she is tolerating toremefine without any side effects. ??    No family history of breast/ovarian cancer    Left ultrasound and MRI - 12/2014 - BI RADS 2.    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 1/14 nodes positive with a 1 cm met, no ECE. 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  ????  12/25/2012 - 02/26/2013: S/p TC q 3 weeks x 4 (75 mg/m2; 600 mg/m2)  ????  04/02/2013 - 05/10/2013: S/p XRT  ????  05/17/2013 - 07/30/2013: Anastrozole, stopped due to joint pains??  08/06/2013 - 09/30/2013: Exemestane stopped due joint pains  07/10/2013: Zometa q 6 months x 3 years, stopped ??  09/30/2013 - 03/08/2014: Tamoxifen, stopped 03/08/14 due to joint pains  03/23/2014 - 09/18/2014: Letrozole, stopped due to joint pains  10/04/2014 - 12/15/2014: Tamoxifen, stopped due to hot flashes and mood swings.  12/16/2014 - 01/30/2015: Anastrozole, stopped due to headache and dizziness  02/09/2015 started Toremefine  ????    Past Medical History   Diagnosis Date   ??? Anxiety    ??? Arthritis    ??? Cancer ([place])      left breast  cancer   ??? Chronic tension headaches    ??? Concussion 2011   ??? Headache(784.0)      OCCAS   ??? Lymphedema of arm      left   ??? Nausea & vomiting    ??? Other and unspecified symptoms and signs involving general sensations and perceptions       HIVES RECENTLY- WAS IN ER Feb 19, 2013   ??? Other ill-defined conditions(799.89)      PHEOCHROMOCYTOMA       Past Surgical History   Procedure Laterality Date   ??? Hx orthopaedic       bilateral shoulder arthroscopy   ??? Hx modified radical mastectomy  11/28/2012     LEFT BREAST MODIFIED RADICAL MASTECTOMY WITH AXILLARY DISSECTION W/ RECONSTRUCTION LEFT BREAST WITH TISSUE EXPANDER AND ALLODERM;PORTACATH INSERTION performed by Ty Hilts, MD at [place]   ??? Hx gyn       c-section   ??? Hx oophorectomy       right   ??? Hx heent       T&A   ??? Hx heent       right adrenal removed   ??? Pr abdomen surgery proc unlisted       hernia mesh repair   ??? Hx appendectomy     ??? Pr abdomen surgery proc unlisted       right adenal removed   ??? Hx vascular access       PORT, THEN REMOVED   ??? Pr breast surgery procedure unlisted  '91     breast implants; replaced bilat implants 2011   ???  Hx breast reconstruction  11/28/2012     BREAST RECONSTRUCTION /C  INSERTION EXPANDER & ALLODERM performed by Sharlotte Alamo, MD at [place]   ??? Hx mastectomy Left    ??? Hx breast reconstruction Bilateral 09/16/2013     REMOVAL AND REPLACEMENT LEFT BREAST IMPLANT TISSUE EXPANDERS WITH SUBTOTAL CAPSULECTOMY, RIGHT BREAST IMPLANT REMOVAL AND REPLACEMENT FOR SYMMETRY/FAT GRAFTING TO BILATERAL BREASTS performed by Sharlotte Alamo, MD at [place]   ??? Hx breast augmentation Left 04/22/2014     REMOVAL OF LEFT BREAST IMPLANT, WASHOUT AND CLOSURE OF LEFT BREAST WOUND performed by Sharlotte Alamo, MD at [facility] MAIN OR       Social History     Social History   ??? Marital status: SINGLE     Spouse name: N/A   ??? Number of children: N/A   ??? Years of education: N/A     Occupational History   ??? Not on file.     Social History Main Topics   ??? Smoking status: Never Smoker   ??? Smokeless tobacco: Never Used   ??? Alcohol use No   ??? Drug use: No   ??? Sexual activity: Not on file     Other Topics Concern   ??? Not on file     Social History Narrative        Current Outpatient Prescriptions on File Prior to Visit   Medication Sig Dispense Refill   ??? toremifene 60 mg tab Take 60 mg by mouth daily. 30 Tab 5   ??? sertraline (ZOLOFT) 100 mg tablet TAKE 1 TAB BY MOUTH DAILY. 30 Tab 3   ??? diazepam (VALIUM) 5 mg tablet Take 1 Tab by mouth every six (6) hours as needed for Anxiety. Max Daily Amount: 20 mg. Indications: MUSCLE SPASM 30 Tab 4   ??? DYMISTA 137-50 mcg/spray spry      ??? lidocaine (LIDODERM) 5 %(700 mg/patch)   2   ??? BOTOX 200 unit injection        No current facility-administered medications on file prior to visit.        Allergies   Allergen Reactions   ??? Aspirin Unknown (comments)     Gi bleeding   ??? Compazine [Prochlorperazine] Rash   ??? Keflex [Cephalexin] Rash   ??? Levaquin [Levofloxacin] Hives     DENIES ALLERGY   ??? Nsaids (Non-Steroidal Anti-Inflammatory Drug) Swelling     Throat swells   ??? Prednisone Rash     DENIES ALLERGY       OB History     Obstetric Comments    Menarche:  63.   LMP: 50.  # of Children:  2.  Age at Delivery of First Child:  31.   Hysterectomy/oophorectomy:  NO/NO.  Breast Bx:  no.  Hx of Breast Feeding:  yes.  BCP:  no. Hormone therapy:  Yes '98 to 12/13.          ROS  Constitutional: Negative    HENT: Negative.   Eyes: Negative.    Respiratory: Negative.   Cardiovascular: Negative.   Gastrointestinal: Negative.   Genitourinary: Negative.   Musculoskeletal: Negative.   Skin: Negative.   Neurological: Negative.   Endo/Heme/Allergies: Negative.   Psychiatric/Behavioral: Negative.    Physical Exam   Cardiovascular: Normal rate and normal heart sounds.    Pulmonary/Chest: Breath sounds normal. Right breast exhibits no inverted nipple, no mass, no nipple discharge, no skin change and no tenderness. Left breast exhibits mass (LIQ  x1, UOQ x3). Left breast exhibits no inverted nipple, no nipple discharge, no skin change and no tenderness. Breasts are symmetrical.       Lymphadenopathy:         Right cervical: No superficial cervical, no deep cervical and no posterior cervical adenopathy present.       Left cervical: No superficial cervical, no deep cervical and no posterior cervical adenopathy present.        Right axillary: No pectoral and no lateral adenopathy present.        Left axillary: No pectoral and no lateral adenopathy present.      BREAST ULTRASOUND - L LIQ  Indication: Left  breast mass LIQ  Technique:  The area was scanned using a high-frequency linear-array near-field transducer  Findings: 2.8 x 1.9 x 1.2 mm mass  Impression: Stable mass  Disposition: Will observe and f/u in 6 months    BREAST ULTRASOUND - L UOQ  Indication: Left  breast subcutaneous mass x3  Technique:  The area was scanned using a high-frequency linear-array near-field transducer  Findings: Hyperechoic area corresponding to palpable abnormality  Impression: Probable fat necrosis v post op change  Disposition: No worrisome finding on ultrasound    ASSESSMENT and PLAN    ICD-10-CM ICD-9-CM    1. 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) C50.312 174.3    2. Mass of multiple sites of left breast N63 611.72         Pt here to f/u on LEFT breast cancer with multiple masses in LEFT reconstructed breast. Reviewed MRI with Dr. Demetrius Revel and this is all post op change with nothing suspicious. The small mass medially was 6 mm and is now 2.8 mm and appears dermal based consistent with sebaceous cyst. Will follow both of these areas and see back in one year. This plan was reviewed with the patient and patient agrees. All questions were answered.    Written by Carilyn Goodpasture, Scribe[employer], as dictated by Dr. Sandford Craze, MD.

## 2015-07-24 NOTE — Progress Notes (Deleted)
HISTORY OF PRESENT ILLNESS  Emily [name] is a 64 y.o. female.  HPI ESTABLISHED patient here for follow up breast cancer.The patient has several concerns regarding palpable lumps that she has in both breasts. In the LEFT breast she has a hard palpable lump under the breast and a string of lumps along the outer edge of the LEFT breast. She also has a palpable lump in her RIGHT axilla area. She is wearing a compression sleeve on the LEFT arm. She continues to use a compression pump.  The patient states she has healed from her reconstruction and is scheduled to have fat grafting in her LEFT breast 09/03/2015. She states she is tolerating toremefine without any side effects.   No family history of breast/ovarian cancer  Left ultrasound and MRI - 12/2014 - BI RADS 2.  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 1/14 nodes positive with a 1 cm met, no ECE. 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  ??  12/25/2012 - 02/26/2013: S/p TC q 3 weeks x 4 (75 mg/m2; 600 mg/m2)  ??  04/02/2013 - 05/10/2013: S/p XRT  ??  05/17/2013 - 07/30/2013: Anastrozole, stopped due to joint pains??  08/06/2013 - 09/30/2013: Exemestane stopped due joint pains  07/10/2013: Zometa q 6 months x 3 years, stopped   09/30/2013 - 03/08/2014: Tamoxifen, stopped 03/08/14 due to joint pains  03/23/2014 - 09/18/2014: Letrozole, stopped due to joint pains  10/04/2014 - 12/15/2014: Tamoxifen, stopped due to hot flashes and mood swings.  12/16/2014 - 01/30/2015: Anastrozole, stopped due to headache and dizziness  02/09/2015 started Toremefine  ??    ROS    Physical Exam    ASSESSMENT and PLAN  {ASSESSMENT/PLAN:19072}

## 2015-07-24 NOTE — Progress Notes (Signed)
HISTORY OF PRESENT ILLNESS  Emily [name] is a 64 y.o. female.  HPI  ESTABLISHED patient here for follow up breast cancer.The patient has several concerns regarding palpable lumps that she has in both breasts. In the LEFT breast she has a hard palpable lump under the breast and a string of lumps along the outer edge of the LEFT breast. She also has a palpable lump in her RIGHT axilla area. She is wearing a compression sleeve on the LEFT arm. She continues to use a compression pump.  The patient states she has healed from her reconstruction and is scheduled to have fat grafting in her LEFT breast 09/03/2015. She states she is tolerating toremefine without any side effects. ??    No family history of breast/ovarian cancer    Left ultrasound and MRI - 12/2014 - BI RADS 2.    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 1/14 nodes positive with a 1 cm met, no ECE. 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  ????  12/25/2012 - 02/26/2013: S/p TC q 3 weeks x 4 (75 mg/m2; 600 mg/m2)  ????  04/02/2013 - 05/10/2013: S/p XRT  ????  05/17/2013 - 07/30/2013: Anastrozole, stopped due to joint pains??  08/06/2013 - 09/30/2013: Exemestane stopped due joint pains  07/10/2013: Zometa q 6 months x 3 years, stopped ??  09/30/2013 - 03/08/2014: Tamoxifen, stopped 03/08/14 due to joint pains  03/23/2014 - 09/18/2014: Letrozole, stopped due to joint pains  10/04/2014 - 12/15/2014: Tamoxifen, stopped due to hot flashes and mood swings.  12/16/2014 - 01/30/2015: Anastrozole, stopped due to headache and dizziness  02/09/2015 started Toremefine  ????    Past Medical History   Diagnosis Date   ??? Anxiety    ??? Arthritis    ??? Cancer ([place])      left breast  cancer   ??? Chronic tension headaches    ??? Concussion 2011   ??? Headache(784.0)      OCCAS   ??? Lymphedema of arm      left   ??? Nausea & vomiting    ??? Other and unspecified symptoms and signs involving general sensations and perceptions       HIVES RECENTLY- WAS IN ER Feb 19, 2013   ??? Other ill-defined conditions(799.89)      PHEOCHROMOCYTOMA       Past Surgical History   Procedure Laterality Date   ??? Hx orthopaedic       bilateral shoulder arthroscopy   ??? Hx modified radical mastectomy  11/28/2012     LEFT BREAST MODIFIED RADICAL MASTECTOMY WITH AXILLARY DISSECTION W/ RECONSTRUCTION LEFT BREAST WITH TISSUE EXPANDER AND ALLODERM;PORTACATH INSERTION performed by Ty Hilts, MD at [place]   ??? Hx gyn       c-section   ??? Hx oophorectomy       right   ??? Hx heent       T&A   ??? Hx heent       right adrenal removed   ??? Pr abdomen surgery proc unlisted       hernia mesh repair   ??? Hx appendectomy     ??? Pr abdomen surgery proc unlisted       right adenal removed   ??? Hx vascular access       PORT, THEN REMOVED   ??? Pr breast surgery procedure unlisted  '91     breast implants; replaced bilat implants 2011   ???  Hx breast reconstruction  11/28/2012     BREAST RECONSTRUCTION /C  INSERTION EXPANDER & ALLODERM performed by Sharlotte Alamo, MD at [place]   ??? Hx mastectomy Left    ??? Hx breast reconstruction Bilateral 09/16/2013     REMOVAL AND REPLACEMENT LEFT BREAST IMPLANT TISSUE EXPANDERS WITH SUBTOTAL CAPSULECTOMY, RIGHT BREAST IMPLANT REMOVAL AND REPLACEMENT FOR SYMMETRY/FAT GRAFTING TO BILATERAL BREASTS performed by Sharlotte Alamo, MD at [place]   ??? Hx breast augmentation Left 04/22/2014     REMOVAL OF LEFT BREAST IMPLANT, WASHOUT AND CLOSURE OF LEFT BREAST WOUND performed by Sharlotte Alamo, MD at [facility] MAIN OR       Social History     Social History   ??? Marital status: SINGLE     Spouse name: N/A   ??? Number of children: N/A   ??? Years of education: N/A     Occupational History   ??? Not on file.     Social History Main Topics   ??? Smoking status: Never Smoker   ??? Smokeless tobacco: Never Used   ??? Alcohol use No   ??? Drug use: No   ??? Sexual activity: Not on file     Other Topics Concern   ??? Not on file     Social History Narrative        Current Outpatient Prescriptions on File Prior to Visit   Medication Sig Dispense Refill   ??? toremifene 60 mg tab Take 60 mg by mouth daily. 30 Tab 5   ??? sertraline (ZOLOFT) 100 mg tablet TAKE 1 TAB BY MOUTH DAILY. 30 Tab 3   ??? diazepam (VALIUM) 5 mg tablet Take 1 Tab by mouth every six (6) hours as needed for Anxiety. Max Daily Amount: 20 mg. Indications: MUSCLE SPASM 30 Tab 4   ??? DYMISTA 137-50 mcg/spray spry      ??? lidocaine (LIDODERM) 5 %(700 mg/patch)   2   ??? BOTOX 200 unit injection        No current facility-administered medications on file prior to visit.        Allergies   Allergen Reactions   ??? Aspirin Unknown (comments)     Gi bleeding   ??? Compazine [Prochlorperazine] Rash   ??? Keflex [Cephalexin] Rash   ??? Levaquin [Levofloxacin] Hives     DENIES ALLERGY   ??? Nsaids (Non-Steroidal Anti-Inflammatory Drug) Swelling     Throat swells   ??? Prednisone Rash     DENIES ALLERGY       OB History     Obstetric Comments    Menarche:  75.   LMP: 64.  # of Children:  2.  Age at Delivery of First Child:  25.   Hysterectomy/oophorectomy:  NO/NO.  Breast Bx:  no.  Hx of Breast Feeding:  yes.  BCP:  no. Hormone therapy:  Yes '98 to 12/13.          ROS  Constitutional: Negative    HENT: Negative.   Eyes: Negative.    Respiratory: Negative.   Cardiovascular: Negative.   Gastrointestinal: Negative.   Genitourinary: Negative.   Musculoskeletal: Negative.   Skin: Negative.   Neurological: Negative.   Endo/Heme/Allergies: Negative.   Psychiatric/Behavioral: Negative.    Physical Exam   Cardiovascular: Normal rate and normal heart sounds.    Pulmonary/Chest: Breath sounds normal. Right breast exhibits no inverted nipple, no mass, no nipple discharge, no skin change and no tenderness. Left breast exhibits mass (LIQ  x1, UOQ x3). Left breast exhibits no inverted nipple, no nipple discharge, no skin change and no tenderness. Breasts are symmetrical.       Lymphadenopathy:         Right cervical: No superficial cervical, no deep cervical and no posterior cervical adenopathy present.       Left cervical: No superficial cervical, no deep cervical and no posterior cervical adenopathy present.        Right axillary: No pectoral and no lateral adenopathy present.        Left axillary: No pectoral and no lateral adenopathy present.      BREAST ULTRASOUND - L LIQ  Indication: Left  breast mass LIQ  Technique:  The area was scanned using a high-frequency linear-array near-field transducer  Findings: 2.8 x 1.9 x 1.2 mm mass  Impression: Stable mass  Disposition: Will observe and f/u in 6 months    BREAST ULTRASOUND - L UOQ  Indication: Left  breast subcutaneous mass x3  Technique:  The area was scanned using a high-frequency linear-array near-field transducer  Findings: Hyperechoic area corresponding to palpable abnormality  Impression: Probable fat necrosis v post op change  Disposition: No worrisome finding on ultrasound    ASSESSMENT and PLAN    ICD-10-CM ICD-9-CM    1. 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) C50.312 174.3    2. Mass of multiple sites of left breast N63 611.72         Pt here to f/u on LEFT breast cancer with multiple masses in LEFT reconstructed breast. Reviewed MRI with Dr. Demetrius Revel and this is all post op change with nothing suspicious. The small mass medially was 6 mm and is now 2.8 mm and appears dermal based consistent with sebaceous cyst. Will follow both of these areas and see back in one year. This plan was reviewed with the patient and patient agrees. All questions were answered.    Written by Carilyn Goodpasture, [employer], as dictated by Dr. Sandford Craze, MD.

## 2015-07-24 NOTE — Patient Instructions (Signed)
Breast Cancer: Care Instructions  Your Care Instructions  Breast cancer occurs when abnormal cells grow out of control in the breast. These cancer cells can spread within the breast, to nearby lymph nodes and other tissues, and to other parts of the body.  Being treated for cancer can weaken your body, and you may feel very tired. Get the rest your body needs so you can feel better.  Finding out that you have cancer is scary. You may feel many emotions and may need some help coping. Seek out family, friends, and counselors for support. You also can do things at home to make yourself feel better while you go through treatment. Call the American Cancer Society (1-800-227-2345) or visit its website at www.cancer.org for more information.  Follow-up care is a key part of your treatment and safety. Be sure to make and go to all appointments, and call your doctor if you are having problems. It's also a good idea to know your test results and keep a list of the medicines you take.  How can you care for yourself at home?  ?? Take your medicines exactly as prescribed. Call your doctor if you think you are having a problem with your medicine. You may get medicine for nausea and vomiting if you have these side effects.  ?? Follow your doctor's instructions to relieve pain. Pain from cancer and surgery can almost always be controlled. Use pain medicine when you first notice pain, before it becomes severe.  ?? Eat healthy food. If you do not feel like eating, try to eat food that has protein and extra calories to keep up your strength and prevent weight loss. Drink liquid meal replacements for extra calories and protein. Try to eat your main meal early.  ?? Get some physical activity every day, but do not get too tired. Keep doing the hobbies you enjoy as your energy allows.  ?? Do not smoke. Smoking can make your cancer worse. If you need help quitting, talk to your doctor about stop-smoking programs and medicines.  These can increase your chances of quitting for good.  ?? Take steps to control your stress and workload. Learn relaxation techniques.  ?? Share your feelings. Stress and tension affect our emotions. By expressing your feelings to others, you may be able to understand and cope with them.  ?? Consider joining a support group. Talking about a problem with your spouse, a good friend, or other people with similar problems is a good way to reduce tension and stress.  ?? Express yourself through art. Try writing, crafts, dance, or art to relieve stress. Some dance, writing, or art groups may be available just for people who have cancer.  ?? Be kind to your body and mind. Getting enough sleep, eating a healthy diet, and taking time to do things you enjoy can contribute to an overall feeling of balance in your life and can help reduce stress.  ?? Get help if you need it. Discuss your concerns with your doctor or counselor.  ?? If you are vomiting or have diarrhea:  ?? Drink plenty of fluids (enough so that your urine is light yellow or clear like water) to prevent dehydration. Choose water and other caffeine-free clear liquids. If you have kidney, heart, or liver disease and have to limit fluids, talk with your doctor before you increase the amount of fluids you drink.  ?? When you are able to eat, try clear soups, mild foods, and liquids until all symptoms   are gone for 12 to 48 hours. Other good choices include dry toast, crackers, cooked cereal, and gelatin dessert, such as Jell-O.  ?? If you have not already done so, prepare a list of advance directives. Advance directives are instructions to your doctor and family members about what kind of care you want if you become unable to speak or express yourself.  When should you call for help?  Call your doctor now or seek immediate medical care if:  ?? You have a fever.  ?? Any part of your breast becomes red, tender, swollen, or hot.   ?? You have pain, redness, or swelling in the arm on the same side as your breast cancer.  Watch closely for changes in your health, and be sure to contact your doctor if:  ?? You have pain that is not controlled by medicine.  ?? You have nausea or vomiting.  ?? You are constipated or have diarrhea.  Where can you learn more?  Go to http://www.healthwise.net/[employer]  Enter V321 in the search box to learn more about "Breast Cancer: Care Instructions."  ?? 2006-2016 [employer]wise[employer]. Care instructions adapted under license by [employer] (which disclaims liability or warranty for this information). This care instruction is for use with your licensed healthcare professional. If you have questions about a medical condition or this instruction, always ask your healthcare professional. Healthwise, Inc[employer] disclaims any warranty or liability for your use of this information.  Content Version: 11.0.578772; Current as of: August 22, 2014

## 2015-08-21 ENCOUNTER — Ambulatory Visit
Admit: 2015-08-21 | Discharge: 2015-08-21 | Payer: PRIVATE HEALTH INSURANCE | Attending: Internal Medicine | Primary: Family Medicine

## 2015-08-21 DIAGNOSIS — C50912 Malignant neoplasm of unspecified site of left female breast: Secondary | ICD-10-CM

## 2015-08-21 MED ORDER — SERTRALINE 25 MG TAB
25 mg | ORAL_TABLET | Freq: Every day | ORAL | 2 refills | Status: DC
Start: 2015-08-21 — End: 2015-11-12

## 2015-08-21 NOTE — Progress Notes (Signed)
HISTORY OF PRESENT ILLNESS  Emily [name] is a 64 y.o. female.  HPI  New to me and this practice, although has numerous specialists and detailed records in chart.  She sees Dr. Raliegh Scarlet, Dr. Drucilla Schmidt, Dr. Gari Crown, Dr. Odette Fraction, Dr. Maretta Bees for botox injections.  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 Lives in [place], but travels to [place] regularly.  She also has a 33 year old adopted son, [name].  She's had a complicated history:  1. Breast cancer, mastectomy on left with left arm lymphedema.  2. Right adrenal pheochromocytoma resected.  3. History of appendectomy.  4. History of right oophorectomy.  5. History of gallbladder attack with sludge seen and episode of nausea and vomiting about two months ago.  Interested in a HIDA scan.  6. Chronic headaches, for which she gets botox.  7. Stress, for which she is on Zoloft 100 mg.  8. Rheumatoid arthritis and chronic joint pain and gets Supartz injections in knees with Dr. Gari Crown.    Social History:  She is single.  She has two grown children and an adopted son.  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  No tobacco, no alcohol.    Family History:  Father had diabetes and heart disease.      Preventive:  She has not yet had Prevnar.  She is on Prolia for osteoporosis.  Last colonoscopy 11 years ago.      Review of Systems   Gastrointestinal: Positive for vomiting (episodic). Negative for abdominal pain.   Musculoskeletal: Positive for back pain (episodic low ), joint pain and myalgias.   Neurological: Positive for headaches.   Psychiatric/Behavioral: The patient has insomnia.    All other systems reviewed and are negative.      Physical Exam   Constitutional: She is oriented to person, place, and time. She appears well-developed and well-nourished.   HENT:   Head: Normocephalic and atraumatic.   Right Ear: External ear normal.   Left Ear: External ear normal.   Nose: Nose normal.    Mouth/Throat: Oropharynx is clear and moist.   Eyes: Conjunctivae and EOM are normal. Pupils are equal, round, and reactive to light.   Neck: Normal range of motion. Neck supple. Carotid bruit is not present. No thyromegaly present.   Cardiovascular: Normal rate, regular rhythm, S1 normal, S2 normal, normal heart sounds and intact distal pulses.    Pulmonary/Chest: Effort normal and breath sounds normal.   Abdominal: Soft. Normal appearance and bowel sounds are normal. She exhibits no mass. There is no hepatosplenomegaly. There is no tenderness.   Healed lap scars midabd   Musculoskeletal: Normal range of motion. She exhibits no edema.   Neurological: She is alert and oriented to person, place, and time.   Skin: Skin is warm, dry and intact.   Psychiatric: She has a normal mood and affect. Her behavior is normal. Judgment and thought content normal.   Nursing note and vitals reviewed.      ASSESSMENT and PLAN  Carena was seen today for establish care and arthritis.    Diagnoses and all orders for this visit:    Breast cancer, stage 2, left ([place])    Screen for colon cancer  -     REFERRAL FOR COLONOSCOPY    Lymphedema of left arm    Pheochromocytoma, right-sp right adrenal resection    Arthritis of both knees  -     sertraline (ZOLOFT) 25 mg tablet; Take  1 Tab by mouth daily. Add to 100 mg dose qd    Intractable chronic paroxysmal hemicrania    Gallbladder sludge  -     NM HEPATOBILIARY DUCT SCAN; Future      the following changes in treatment are made: add zoloft 25 to the 100 mg dose and aptp in 2-3 mo

## 2015-08-21 NOTE — Progress Notes (Signed)
Pt see's Dr Melina Modena for GYN, Dr Laurena Bering for Oncology, Dr Drucilla Schmidt for surgery, Dr Gari Crown for Rheumatoid arthritis, Dr Mickie Hillier for endocrinology, Dr Everardo Beals for orthoped (shoulder surgery), Darrol Jump for radiology, Dorinda Hill for Breast surgery reconstruction

## 2015-08-28 MED ORDER — VALACYCLOVIR 1 G TAB
1 gram | ORAL_TABLET | ORAL | 5 refills | Status: DC
Start: 2015-08-28 — End: 2018-10-24

## 2015-08-28 NOTE — Telephone Encounter (Signed)
Returned patient's call to clarify her dose. Patient is currently on Valtrex 1000 mg 2 tabs twice daily. Order e-scribed, patient notified her script should be ready for pick up this afternoon. Patient voiced understanding.

## 2015-08-28 NOTE — Telephone Encounter (Signed)
Patient needs a refill for her Valtrex.  She said she had this from her previous doctor.  She is a new patient with Dr. Wanda Plump.  If there are any issues please call patient back.

## 2015-09-02 NOTE — Progress Notes (Signed)
Faxed a signed copy of refill request for valium 5 mg tabs [facility] order of Dr. Drucilla Schmidt.    Order for:    Valium 5 mg tab, PO, take 1 tab every 6 months as needed for anxiety.      Confirmation fax received.

## 2015-09-03 ENCOUNTER — Ambulatory Visit: Payer: BLUE CROSS/BLUE SHIELD | Primary: Family Medicine

## 2015-09-08 ENCOUNTER — Encounter: Payer: BLUE CROSS/BLUE SHIELD | Primary: Family Medicine

## 2015-09-09 ENCOUNTER — Encounter: Payer: BLUE CROSS/BLUE SHIELD | Primary: Family Medicine

## 2015-09-09 MED ORDER — DENOSUMAB 60 MG/ML SUB-Q SYRINGE
60 mg/mL | Freq: Once | SUBCUTANEOUS | Status: AC
Start: 2015-09-09 — End: 2015-09-14
  Administered 2015-09-14: 15:00:00 via SUBCUTANEOUS

## 2015-09-14 ENCOUNTER — Inpatient Hospital Stay: Admit: 2015-09-14 | Payer: BLUE CROSS/BLUE SHIELD | Primary: Family Medicine

## 2015-09-14 ENCOUNTER — Ambulatory Visit
Admit: 2015-09-14 | Discharge: 2015-09-14 | Payer: PRIVATE HEALTH INSURANCE | Attending: Nurse Practitioner | Primary: Family Medicine

## 2015-09-14 DIAGNOSIS — C50312 Malignant neoplasm of lower-inner quadrant of left female breast: Secondary | ICD-10-CM

## 2015-09-14 DIAGNOSIS — C50919 Malignant neoplasm of unspecified site of unspecified female breast: Secondary | ICD-10-CM

## 2015-09-14 LAB — POC CHEM8
Anion gap (POC): 20 mmol/L — ABNORMAL HIGH (ref 5–15)
BUN (POC): 17 MG/DL (ref 9–20)
CO2 (POC): 26 MMOL/L (ref 21–32)
Calcium, ionized (POC): 1.22 MMOL/L (ref 1.12–1.32)
Chloride (POC): 97 MMOL/L — ABNORMAL LOW (ref 98–107)
Creatinine (POC): 0.5 MG/DL — ABNORMAL LOW (ref 0.6–1.3)
GFRAA, POC: >60 mL/min/{1.73_m2} (ref >60)
GFRNA, POC: >60 mL/min/{1.73_m2} (ref >60)
Glucose (POC): 82 MG/DL (ref 65–105)
Hematocrit (POC): 39 % (ref 35.0–47.0)
Hemoglobin (POC): 13.3 GM/DL (ref 11.5–16.0)
Potassium (POC): 4 MMOL/L (ref 3.5–5.1)
Sodium (POC): 139 MMOL/L (ref 136–145)

## 2015-09-14 LAB — PHOSPHORUS: Phosphorus: 3.4 MG/DL (ref 2.6–4.7)

## 2015-09-14 LAB — MAGNESIUM: Magnesium: 1.8 mg/dL (ref 1.6–2.4)

## 2015-09-14 MED FILL — PROLIA 60 MG/ML SUBCUTANEOUS SYRINGE: 60 mg/mL | SUBCUTANEOUS | Qty: 1

## 2015-09-14 NOTE — Progress Notes (Signed)
Problem: Chemotherapy Treatment  Goal: *Chemotherapy regimen followed  Outcome: Progressing Towards Goal  Pt arrived to [facility] ambulatory and in no distress for scheduled prolia injection, Pt tolerated TX well.

## 2015-09-14 NOTE — Patient Instructions (Signed)
Come see us in 6 months.

## 2015-09-14 NOTE — Progress Notes (Signed)
[facility]  [address] [place]   [address]  W: [phone]   F: [phone]      f/u HEME/ONC CONSULT    Reason for visit: evaluation for treatment for  Breast Cancer    Consulting physician:  Dr. Gilford Rile, Dr. Raynald Blend, Brooke Bonito.  Ref physician:  Dr. Drucilla Schmidt    HPI:   Emily [name] is a 64 y.o.  female who I am seeing for adjuvant therapy for breast cancer.      She had a screening ultrasound on 09/24/12 showing a 2.6 cm left LN.  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  HER 2 negative IHC at 0.    She has a history of a pheochromocytoma surgically removed in 1997.    She then had a MRI breast on 10/05/12 showing the 2.8 x 2 cm LN as well as a left breast LIQ 0.9 cm x 1.1 cm x 0.6 cm mass.  Biopsy of that mass by FNA on 10/16/12 shows a poorly differentiated carcinoma.  These slides were air-dried, HER 2 not able to be performed on them.    Mammaprint of LN shows high risk luminal, ER + at 0.19, PR + at 0.61, HER 2 negative at -0.69.    She underwent L mastectomy on 11/28/12 showing IDC with neuroendocrine features, gr 2, 0.5 cm, DCIS gr 2-3, + LVI, 1/14 LN + with a 1 cm met, no ECE.  HER 2 negative (ratio 1.2, sig/cell 2.8), ER + 95%, PR + 5%; ki67 5%; DCIS ER + 95%, PR + 1%.    S/p TC q 3 weeks x 4 from 12/25/12-02/26/13 (75 mg/m2; 600 mg/m2)    S/p XRT from 04/02/13-05/10/13    Started anastrozole 05/17/13, stop 07/30/13 (joint pains)    exemestane 08/06/13-09/30/13 (joint pains)    Started zometa 07/10/13, q 6 months x 3 years, stopped     Started tamoxifen 09/30/13, stopped on 03/08/14 due to joint pains    Started letrozole 03/23/14, stopped 09/18/14 due to joint pains     Started tamoxifen 10/04/14, stopped on 12/15/14 due to hot flashes and mood swings.    Anastrozole 12/16/14-01/30/15, stopped due to headache and dizziness    toremefine 02/09/15-    Interval History: Continue toremefine, tolerating well. complains of gr 1 fatigue, gr 1 hot flashes, gr 1 insomnia, gr 2 anxiety, 7/10 pain from RA and migraines, gr 2 swelling, gr 2 headache, gr 2 libido.     LMP 1996.    DX   Encounter Diagnosis   Name Primary?   ??? Malignant neoplasm of lower-inner quadrant of left female breast ([place]) Yes      Past Medical History   Diagnosis Date   ??? Anxiety    ??? Arthritis    ??? Cancer ([place])      left breast  cancer   ??? Chronic tension headaches    ??? Concussion 2011   ??? Headache(784.0)      OCCAS   ??? Lymphedema of arm      left   ??? Nausea & vomiting    ??? Other and unspecified symptoms and signs involving general sensations and perceptions      HIVES RECENTLY- WAS IN ER Feb 19, 2013   ??? Other ill-defined conditions(799.89)      PHEOCHROMOCYTOMA     Past Surgical History   Procedure Laterality Date   ??? Hx orthopaedic       bilateral shoulder arthroscopy   ??? Hx modified radical mastectomy  11/28/2012     LEFT BREAST MODIFIED RADICAL MASTECTOMY WITH AXILLARY DISSECTION W/ RECONSTRUCTION LEFT BREAST WITH TISSUE EXPANDER AND ALLODERM;PORTACATH INSERTION performed by Binnie Rail, MD at [facility] AMBULATORY OR   ??? Hx gyn       c-section   ??? Hx oophorectomy       right   ??? Hx heent       T&A   ??? Hx heent       right adrenal removed   ??? Pr abdomen surgery proc unlisted       hernia mesh repair   ??? Hx appendectomy     ??? Pr abdomen surgery proc unlisted       right adenal removed   ??? Hx vascular access       PORT, THEN REMOVED   ??? Pr breast surgery procedure unlisted  '91     breast implants; replaced bilat implants 2011   ??? Hx breast reconstruction  11/28/2012     BREAST RECONSTRUCTION /C  INSERTION EXPANDER & ALLODERM performed by Ivar Bury, MD at [facility] AMBULATORY OR   ??? Hx mastectomy Left     ??? Hx breast reconstruction Bilateral 09/16/2013     REMOVAL AND REPLACEMENT LEFT BREAST IMPLANT TISSUE EXPANDERS WITH SUBTOTAL CAPSULECTOMY, RIGHT BREAST IMPLANT REMOVAL AND REPLACEMENT FOR SYMMETRY/FAT GRAFTING TO BILATERAL BREASTS performed by Ivar Bury, MD at [facility] MAIN OR   ??? Hx breast augmentation Left 04/22/2014     REMOVAL OF LEFT BREAST IMPLANT, WASHOUT AND CLOSURE OF LEFT BREAST WOUND performed by Ivar Bury, MD at [facility] MAIN OR     Social History     Social History   ??? Marital status: SINGLE     Spouse name: N/A   ??? Number of children: N/A   ??? Years of education: N/A     Social History Main Topics   ??? Smoking status: Never Smoker   ??? Smokeless tobacco: Never Used   ??? Alcohol use No   ??? Drug use: No   ??? Sexual activity: Not Asked     Other Topics Concern   ??? None     Social History Narrative     Family History   Problem Relation Age of Onset   ??? Alzheimer Mother    ??? Diabetes Mother    ??? Other Mother      COMBATIVE POST ANESTHESIA   ??? Heart Disease Father    ??? Diabetes Father    ??? Lung Disease Father      COPD   ??? Headache Sister    ??? Migraines Sister    ??? Anesth Problems Neg Hx        Current Outpatient Prescriptions   Medication Sig Dispense Refill   ??? valACYclovir (VALTREX) 1 gram tablet Take 2 tabs by mouth twice a day   Indications: PREVENTION OF HERPES ZOSTER IN IMMUNOCOMPROMISED PATIENT 120 Tab 5   ??? sertraline (ZOLOFT) 25 mg tablet Take 1 Tab by mouth daily. Add to 100 mg dose qd 30 Tab 2   ??? toremifene 60 mg tab Take 60 mg by mouth daily. 30 Tab 5   ??? sertraline (ZOLOFT) 100 mg tablet TAKE 1 TAB BY  MOUTH DAILY. 30 Tab 3   ??? DYMISTA 137-50 mcg/spray spry      ??? lidocaine (LIDODERM) 5 %(700 mg/patch)   2   ??? BOTOX 200 unit injection      ??? diclofenac (VOLTAREN) 1 % gel USE 1-2 GRAM FOUR TIMES A DAY AS NEEDED  6   ??? butalbital-acetaminophen-caff (FIORICET) 50-300-40 mg per capsule      ??? codeine-butalbital-acetaminophen-caffeine (FIORICET WITH CODEINE) 50-325-40-30 mg per capsule       ??? oxyCODONE IR (ROXICODONE) 5 mg immediate release tablet Take 5 mg by mouth every four (4) hours as needed for Pain.     ??? diazepam (VALIUM) 5 mg tablet Take 1 Tab by mouth every six (6) hours as needed for Anxiety. Max Daily Amount: 20 mg. Indications: MUSCLE SPASM 30 Tab 4       Allergies   Allergen Reactions   ??? Aspirin Unknown (comments)     Gi bleeding   ??? Compazine [Prochlorperazine] Rash   ??? Keflex [Cephalexin] Rash   ??? Nsaids (Non-Steroidal Anti-Inflammatory Drug) Swelling     Throat swells       Review of Systems    A comprehensive review of systems was performed and all systems were negative except for HPI and for the symptom report form, reviewed and scanned in.      Objective:  Physical Exam:  Visit Vitals   ??? BP 121/69   ??? Pulse 62   ??? Temp 97 ??F (36.1 ??C) (Temporal)   ??? Resp 18   ??? Ht $Remo'5\' 6"'ImQvG$  (1.676 m)   ??? Wt 127 lb 6.4 oz (57.8 kg)   ??? SpO2 97%   ??? BMI 20.56 kg/m2       General:  Alert, cooperative, no distress, appears stated age.   Head:  Normocephalic, without obvious abnormality, atraumatic.   Eyes:  Conjunctivae/corneas clear. PERRL, EOMs intact.   Throat: Lips, mucosa, and tongue normal.    Neck: Supple, symmetrical, trachea midline, no adenopathy, thyroid: no enlargement/tenderness/nodules   Back:   Symmetric, no curvature. ROM normal. No CVA tenderness.   Lungs:   Clear to auscultation bilaterally.   Chest wall:  S/p bilateral mastectomy with reconstruction.    Heart:  Regular rate and rhythm, S1, S2 normal, no murmur, click, rub or gallop.   Abdomen:   Soft, non-tender. Bowel sounds normal. No masses,  No organomegaly.   Extremities:  Extremities normal, atraumatic, no cyanosis.   Skin: No rash   Lymph nodes: Cervical, supraclavicular, and axillary nodes normal.   Neurologic: CNII-XII intact.       Diagnostic Imaging   10/12/12 PET:  Negative for distant mets  10/22/12 brain MRI:  Negative    09/24/12 dexa L hip T -2.5; L spine T -2.3  osteoporosis    12/11/13 bilateral MRI  Negative     03/26/14 MRI Brain  Negative    12/05/14 MRI breast   Negative    12/05/14 [place] L breast?? ????  IMPRESSION:  1. Very small, hypoechoic lesion in continuity and immediately below the ??  dermis overlying the sternum/medialmost left breast. Finding suggests dermal ??  lesion such as epidermal inclusion cyst versus postoperative change.  BI-RADS Category 2 - Benign findings. ??    Lab Results  Lab Results   Component Value Date/Time    WBC 4.7 09/09/2013 10:19 AM    HGB 12.2 09/09/2013 10:19 AM    HCT 37.0 09/09/2013 10:19 AM    PLATELET 253 09/09/2013 10:19 AM  MCV 88.7 09/09/2013 10:19 AM       Lab Results   Component Value Date/Time    SODIUM 144 02/05/2013 09:44 AM    POTASSIUM 4.4 02/05/2013 09:44 AM    CHLORIDE 105 02/05/2013 09:44 AM    CO2 29 02/05/2013 09:44 AM    ANION GAP 10 02/05/2013 09:44 AM    GLUCOSE 101 02/05/2013 09:44 AM    BUN 14 02/05/2013 09:44 AM    CREATININE 0.32 02/05/2013 09:44 AM    BUN/CREATININE RATIO 44 02/05/2013 09:44 AM    GFR EST AA >60 02/05/2013 09:44 AM    GFR EST NON-AA >60 02/05/2013 09:44 AM    CALCIUM 8.8 02/05/2013 09:44 AM    ALT 52 02/05/2013 09:44 AM    AST 22 02/05/2013 09:44 AM    ALK. PHOSPHATASE 136 02/05/2013 09:44 AM    PROTEIN, TOTAL 6.2 02/05/2013 09:44 AM    ALBUMIN 3.8 02/05/2013 09:44 AM    GLOBULIN 2.4 02/05/2013 09:44 AM    A-G RATIO 1.6 02/05/2013 09:44 AM     Assessment/Plan:  64 y.o. female with a 0.5 cm left breast cancer, IDC with endocrine features, 1/14 LN +, gr 2, high risk mammaprint,  ER and PR +, HER 2 negative. PS 0    1. Breast cancer with neuroendocrine features, stage IIA, T1bN1aMx    With no further therapy, her RR is about 36%; endocrine therapy will decrease her RR to 19%; and 2nd gen chemo will decrease her RR to 12%; 3rd gen to 10%.  These #s may be higher with her high risk mammaprint.     Based on her pheo and now breast cancer history, I recommended genetic counseling -- referral to [facility] made at prior visit. Has seen them and they  have performed BRCA testing -- negative.  [facility] has run a PGL panel on 09/10/14.    No evidence of recurrence, continue toremifene.       Last eye exam: 06/2015  Last gyn exam: 06/2015    Due for imaging in March 2017, MRI breast, ordered today. Patient is unable to tolerate mammogram due to pain.     Will see her back in 6 months.     2. Anxiety/Depression: Stable today; continue sertraline 100 mg daily. Valium prn.  She has seen Renee Ramus previously. I advised that Dr. Wanda Plump should manage her anti depressant. She is interested in switching to a new drug called Triltellix.     3. Osteoporosis: Had received zometa previously, which was give for one dose on 07/10/13. Switched to prolia 60 mg SC q 6 months, received 04/08/14,10/01/14, 03/10/15, 09/14/15. She has one remaining Prolia injection.     DEXA 10/01/14 at the [facility], patient reports improvement.      Vit D3 2000 International Units daily, advised her to start this today.     4. Joint pains: improved on toremifene    5. Fibromyalgia/RA: Not currently on any treatment, discussed with patient that there are medical options to assist with her pain. She will follow up with her Rheumatologist. She took MTX prior to cancer diagnosis which she did not tolerate.  Previously did not tolerate lyrica due to sedation. I advised her that switching to Cymbalta was okay.     6. Headaches: improved on toremifene, 12/11/13 Brain MRI negative, following with Neurology, has received Botox injections previously.    7. Lump in L breast: MRI breast 12/05/14 negative. [place] on 12/05/14 suggests epidermal inclusion cyst vs postoperative  changes.     8. Dry Eye: likely from toremifene. Opthalmology following    9. Jaw Pain: resolved today, since having crown placed in Dec 2015.     10. Muscle spasm, back: intermittent, will monitor; if it persists or worsens, she will let us know      Follow-up Disposition:  Return in about 6 months (around 03/14/2016) for 63m fu, sand/Kolby Myung.     Sonda Rumble, MD

## 2015-09-14 NOTE — Progress Notes (Signed)
Emily [name] is a 64 y.o. female here for follow-up of breast cancer.

## 2015-09-14 NOTE — Progress Notes (Signed)
[facility] Nursing Progress Note    0900  Patient arrived to [place] ambulatory accompanied by self for scheduled prolia Cycle PT denies new complaints.     Labs drawn peripherally    Vitals Blood pressure 121/69, pulse 62, temperature 97 ??F (36.1 ??C), resp. rate 18, weight 57.8 kg (127 lb 6.4 oz).    Mag and phos will be in results review in CC    labs   Recent Results (from the past 12 hour(s))   POC CHEM8    Collection Time: 09/14/15  9:44 AM   Result Value Ref Range    Calcium, ionized (POC) 1.22 1.12 - 1.32 MMOL/L    Sodium (POC) 139 136 - 145 MMOL/L    Potassium (POC) 4.0 3.5 - 5.1 MMOL/L    Chloride (POC) 97 (L) 98 - 107 MMOL/L    CO2 (POC) 26 21 - 32 MMOL/L    Anion gap (POC) 20 (H) 5 - 15 mmol/L    Glucose (POC) 82 65 - 105 MG/DL    BUN (POC) 17 9 - 20 MG/DL    Creatinine (POC) 0.5 (L) 0.6 - 1.3 MG/DL    GFR-AA (POC) >60 >60 ml/min/1.62m2    GFR, non-AA (POC) >60 >60 ml/min/1.7m2    Hemoglobin (POC) 13.3 11.5 - 16.0 GM/DL    Hematocrit (POC) 39 35.0 - 47.0 %    Comment Comment Not Indicated.               Patient received infusion without difficulty.     Medications this visit:    Prolia SQ    Pt declined post injection observation.    Next appointment,  In 6 months.    1000 D/C from [facility]  ambulatory in no distress, accompanied by self.

## 2015-09-17 ENCOUNTER — Encounter

## 2015-09-17 MED ORDER — DIAZEPAM 5 MG TAB
5 mg | ORAL_TABLET | Freq: Four times a day (QID) | ORAL | 0 refills | Status: DC | PRN
Start: 2015-09-17 — End: 2017-02-08

## 2015-09-17 NOTE — Telephone Encounter (Signed)
Returned Hello message left by patient this morning. She says that she needs a refill of Diazepam and that the "pharamcy was having trouble faxing request" to [place].    Per chart, Greta, RN, had filled out request and [facility] [place] on [facility]02[facility]15 (see note).     [facility] and [facility]iazepam wasn't there. I called the pharmacy [phone] and left message, explaining to allow authorization for refill of diazepam since the patient says she never received the refill of it that we had authorized on 11/30. The pharmacist called me back and said that the fax was received on 11/30 and they filled the diazepam and someone came to pick it up and paid for it on 09/09/15 (last week). He said it was 30 pills of diazepam.    I tried calling the patient back to discuss this with her further but she did not answer, so I left this information on her cell phone and provided our office call back number. I also suggested that she call her pharmacy to figure out what is going on with the diazepam.    Additionally, when I spoke to the patient earlier this morning, she said that she was admitted to [facility] night for sudden LEFT arm swelling, redness, pain. Was admitted for cellulitis lymphedema and is on 3 diferent IV abx. Is going to have ID see her for increased WBC, fever, feeling weak. She said that her BP has been running low so they are not giving her anxiety medication at this time, and it has also been hard for her to receive pain medication because of the low BP. Patient doesn't know the plan yet but I told her I was sorry this was all happening and I hoped that she felt better soon!     Patient inquired about going to see Merlene Laughter at [place] [facility], possibly next week. I called Patty and left her a message. She called me back and told me that she had already spoken to the patient and  set her up tentatively with an appointment for next week as well as for the following week. I placed an order in [place] for PT eval and treat for LE, per Patty's request.     Will route this note to Dr. Drucilla Schmidt and his nurse Juliann Pulse to make them aware of this situation.

## 2015-09-17 NOTE — Telephone Encounter (Signed)
Patient called and stated that she is in [facility] in [place] with lymphedema cellulitis. She stated that when she was here on Monday and even after the appointment she felt fine, and out of nowhere she was in a lot of pain, and her arm is red and swollen. She stated that they are going to keep her in the hospital for a couple of days. Please call patient back at [phone].

## 2015-09-17 NOTE — Telephone Encounter (Signed)
Called and left message on patient's voicemail to call office back if we could help her in anyway, or if the hospital needed records or anything from our office.

## 2015-09-17 NOTE — Telephone Encounter (Signed)
[facility] from [facility] [place] for refill of patient's diazepam 5 mg tablet.      Per Dr. Drucilla Schmidt, called in diazepam 5 mg #30 to [facility] at [phone].      I L/M on patient's VM that I had called this in, but did mention that it has been only a short time since her last refill.

## 2015-09-18 ENCOUNTER — Ambulatory Visit: Payer: BLUE CROSS/BLUE SHIELD | Primary: Family Medicine

## 2015-09-18 ENCOUNTER — Encounter: Payer: BLUE CROSS/BLUE SHIELD | Primary: Family Medicine

## 2015-09-18 NOTE — Telephone Encounter (Signed)
Spoke to patient today.  She is in the hospital with a strep infection, cellulitis and lymphedema.  They are now thinking that they may keep her for a couple of weeks.  She will know more in the morning.      There has been a bit of a mix-up with her Valium.  Apparently a refill was authorized a couple of weeks ago while I was on vacation.  Patient states that she never picked it up, but pharmacy told another one of our nurses yesterday that it had been picked up and paid for.     I called in another Rx for Valium yesterday, but she is in the hospital and cannot pick this up.  Pharmacy actually called her to let her know it was ready, and she advised them to hold this until she was discharged.  We talked about her use of Valium.  She takes 1/2 pill at night to help her sleep.  With that in mind, an Rx should last for 60 days.  I will keep a watch on this.      I told her that I hoped that she felt better soon.  She has a follow-up with lymphedema next week; hopefully she will be discharged by that time.

## 2015-09-24 ENCOUNTER — Encounter: Attending: Internal Medicine | Primary: Family Medicine

## 2015-09-25 ENCOUNTER — Inpatient Hospital Stay: Admit: 2015-09-25 | Payer: BLUE CROSS/BLUE SHIELD | Primary: Family Medicine

## 2015-09-25 DIAGNOSIS — I890 Lymphedema, not elsewhere classified: Secondary | ICD-10-CM

## 2015-09-25 NOTE — Progress Notes (Signed)
[address]  [address]  [place]  [address]      EVALUATION    NAME: Emily [name] AGE: 64 y.o.  GENDER: female  DATE: 09/25/2015  REFERRING PHYSICIAN: Sandford Craze, MD: CC: Emily Offer, MD  HISTORY AND BACKGROUND:  Pt is a 64 y/o female returns for evaluation of L UE lymphedema s/p onset of cellulitis 09/15/2015.  Pt states she noticed redness L UE, and went to ER at [facility].  States she did not have a fever initially, with onset of fever while in ER.  Pt was seen by infectious disease, stating multiple IV antibiotics were tried, with Vancomycin seeming to be the most effective.  Pt states she is feeling better, arm is much less swollen, returning here today for assessment of L UE lymphedema.  Pt brings compression garments all laundered as instructed.    Breast cancer history at time of initial evaluation as follows:    Pt found to have 2.6 cm LN L axilla 09/24/2012.  Underwent biopsy on 10/02/2012 with findings of metastatic neoplasm.  MRI on 10/05/2012 revealed L breast mass in addition to lymph node.  Biopsy of breast mass on 10/16/2012 showed poorly differentiated carcinoma.  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  Pt completed chemotherapy and XRT.  Pt received care to address L UE lymphedema at [facility], prior to coming to this clinic. Pt presented at initial eval with pain L axilla, reporting "fluid pocket" in region, severe pain with shoulder movement, and feeling of "fullness" L upper arm. Pt stated symptoms presented post surgery.  Pt reports bilat shoulder dysfunction prior to cancer diagnosis, having undergone R rotator cuff repair 7 years  ago, L rotator cuff repair 3 years ago. Pt is s/p bilateral breast reconstruction 09/16/2013.  States additional reconstructive surgery scheduled for 01/30/2014.  Pt underwent multiple surgeries for reconstruction bilateral breasts, reduction with implant placement R breast, small implant placement with fat grafting L breast.  Pt states continued concerns regarding healing L implant was tenuous, and she has refrained from use of vasopneumatic pump to address lymphedema per Dr. Benjaman Lobe order.  Pt reports she has been compliant with day/night time compression garment wear L UE, as instructed.    Primary Diagnosis:  ?? 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 Malignant neoplasm of breast (C50.2)  Date of Onset: 11/29/2011  Present Symptoms and Functional Limitations: Pain L axilla/upper arm/upper quadrant anterior/lateral aspect.  L UE lymphedema.  LLIS lymphedema outcomes measure:  41/72; 60% impairment.  Past Medical History:   Past Medical History   Diagnosis Date   ??? Anxiety    ??? Arthritis    ??? Cancer ([place])      left breast  cancer   ??? Chronic tension headaches    ??? Concussion 2011   ??? Headache(784.0)      OCCAS   ??? Ill-defined condition      cellulitis L UE   ??? Lymphedema of arm      left   ??? Nausea & vomiting    ??? Other and unspecified symptoms and signs involving general sensations and perceptions      HIVES RECENTLY- WAS IN ER Feb 19, 2013   ???  Other ill-defined conditions(799.89)      PHEOCHROMOCYTOMA     Past Surgical History   Procedure Laterality Date   ??? Hx orthopaedic       bilateral shoulder arthroscopy   ??? Hx modified radical mastectomy  11/28/2012     LEFT BREAST MODIFIED RADICAL MASTECTOMY WITH AXILLARY DISSECTION W/ RECONSTRUCTION LEFT BREAST WITH TISSUE EXPANDER AND ALLODERM;PORTACATH INSERTION performed by Ty Hilts, MD at [place]   ??? Hx gyn       c-section   ??? Hx oophorectomy       right   ??? Hx heent       T&A    ??? Hx heent       right adrenal removed   ??? Pr abdomen surgery proc unlisted       hernia mesh repair   ??? Hx appendectomy     ??? Pr abdomen surgery proc unlisted       right adenal removed   ??? Hx vascular access       PORT, THEN REMOVED   ??? Pr breast surgery procedure unlisted  '91     breast implants; replaced bilat implants 2011   ??? Hx breast reconstruction  11/28/2012     BREAST RECONSTRUCTION /C  INSERTION EXPANDER & ALLODERM performed by Sharlotte Alamo, MD at [place]   ??? Hx mastectomy Left    ??? Hx breast reconstruction Bilateral 09/16/2013     REMOVAL AND REPLACEMENT LEFT BREAST IMPLANT TISSUE EXPANDERS WITH SUBTOTAL CAPSULECTOMY, RIGHT BREAST IMPLANT REMOVAL AND REPLACEMENT FOR SYMMETRY/FAT GRAFTING TO BILATERAL BREASTS performed by Sharlotte Alamo, MD at [place]   ??? Hx breast augmentation Left 04/22/2014     REMOVAL OF LEFT BREAST IMPLANT, WASHOUT AND CLOSURE OF LEFT BREAST WOUND performed by Sharlotte Alamo, MD at [facility] MAIN OR     Current Medications:    Current Outpatient Prescriptions   Medication Sig   ??? diazePAM (VALIUM) 5 mg tablet Take 1 Tab by mouth every six (6) hours as needed for Anxiety. Max Daily Amount: 20 mg. Indications: MUSCLE SPASM   ??? diclofenac (VOLTAREN) 1 % gel USE 1-2 GRAM FOUR TIMES A DAY AS NEEDED   ??? butalbital-acetaminophen-caff (FIORICET) 50-300-40 mg per capsule    ??? codeine-butalbital-acetaminophen-caffeine (FIORICET WITH CODEINE) 50-325-40-30 mg per capsule    ??? valACYclovir (VALTREX) 1 gram tablet Take 2 tabs by mouth twice a day   Indications: PREVENTION OF HERPES ZOSTER IN IMMUNOCOMPROMISED PATIENT   ??? oxyCODONE IR (ROXICODONE) 5 mg immediate release tablet Take 5 mg by mouth every four (4) hours as needed for Pain.   ??? sertraline (ZOLOFT) 25 mg tablet Take 1 Tab by mouth daily. Add to 100 mg dose qd   ??? toremifene 60 mg tab Take 60 mg by mouth daily.   ??? sertraline (ZOLOFT) 100 mg tablet TAKE 1 TAB BY MOUTH DAILY.   ??? DYMISTA 137-50 mcg/spray spry     ??? lidocaine (LIDODERM) 5 %(700 mg/patch)    ??? BOTOX 200 unit injection      No current facility-administered medications for this encounter.      Allergies:   Allergies   Allergen Reactions   ??? Aspirin Unknown (comments)     Gi bleeding   ??? Compazine [Prochlorperazine] Rash   ??? Keflex [Cephalexin] Rash   ??? Nsaids (Non-Steroidal Anti-Inflammatory Drug) Swelling     Throat swells      Social/Work History and Prior Level of  Function: Pt lives on the [place] end, spending some time in [place].  Pt has a 64 year old son which she and her partner care for.   Living Situation: Primary residence [place].     Trainable Caregiver?: Pt's significant other is an Conservation officer, historic buildings.  Available to assist pt as needed.    Self-care/ADLs: performing mod I, additional time, UE dressing deliberately to avoid pain L UE.     Mobility: indep transfers, bed mobility, gait in clinic.   Sleeping Arrangement:  Bed.   Adaptive Equipment Owned: na  Previous Therapy:  Pt received therapy for lymphedema management at [facility] prior to coming to this clinic. 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  Pt experienced progression of stage 0 lymphedema to stage II lymphedema L UE end of May, with increase in limb volume beginning of June 2015.  Phase I CDT initiated at that time including application of compression bandaging. Custom flat knit compression garments for daytime use and Solaris Tribute for night time use were fit upon discontinuing compression bandaging.  Compression/Lymphedema Equipment:  Custom flat knit compression sleeve and custom glove, 2 sets, most recently received after appt at this clinic 02/2015.  Pt has new Solaris Tribute arm sleeve; Lymphapress Comfy sleeve pump.    SUBJECTIVE:   Pt arrives today with custom flat knit daytime compression garments in bag, all laundered.  Pt states she has also laundered night time  compression garment.  Pt reports she is surprised regarding how much better her arm looks post cellulitis, continuing oral antibiotic.  States she is supposed to resume PT in ortho setting to address shoulder dysfunction at clinic near her home, and she may consider if lymphedema is stable.  Patient???s goals for therapy: Lymphedema management    EVALUATION AND OBJECTIVE DATA SUMMARY:   Pain: no L UE pain reported.           Skin and Tissue Assessment:  Dermal Status:  ( x)  Intact: L UE ( )  Dry   ( )  Tenuous:  ( )  Flaky   ( )  Wound/lesion present     ( )  Dermatitis    Texture/Consistency:  (x )  Boggy:  Forearm most problematic area at this time. ( )  Pitting Edema   ( )  Brawny (x )  Implants: bilateral breast   ( x)  Fibrotic/Woody:  Forearm arm.    Pigmentation/Color Change:  ( )  Normal:   ( )  Hemosiderin   ( )  Red:  ( )  Erythematous   ( )  Hyperpigmented:   ( )  Hyperlipodermatosclerosis   Anomalies:    ()  Axillary web syndrome:   ( )  Vesicles   ( )  Petechiae ( )  Warty Vercusis   ( )  Bullae ( )  Papilloma     Nails:  (x )  Normal  ( )  Fungus  Stemmers Sign: negative  Volumetric Measurements:   Right: 1529.44 mL Left:  1698.25 mL   % Difference:11.04% Dominance: Right   (See scanned graph)  Range of Motion: R shoulder flex 90, abd 90, ER 70, IR 70, with elbow, wrist, hand WFL.  L shoulder Flex 60, Abd 50, ER 45, IR 45.    Strength: Shoulder strength bilat grossly 4/5 within available ROM, limited by pain/weakness. Remaining UE strength 4+/5.  Sensation:  Pt c/o  numbness upper arm, lateral upper quadrant.  Palpation:  Note significant axillary web syndrome, palpable cording noted axilla.   Posture: Note forward head/shoulder posture noted.  Mobility:  Bed/Chair Mobility:  indep Transfers:  indep   Sitting Balance:  good Standing Balance:  good   Gait:  indep Wheelchair Mobility:  na   Endurance:  intact for current level of activity, including gait community  distances, working abbreviated schedule. Stairs:  Not assessed.       Safety:  Patient is alert and oriented:  x4   Safety awareness:  intact   Fall Risk?:  Low, use of pain meds evening hours.      Precautions:  Standard lymphedema precautions to include avoiding blood pressure readings, injections and IVs or other procedures/acts that could lead to broken skin on affected area, and avoiding excessive heat, resistive activity or altitude without compression garment    Evaluation Time: 10:00-10:40 am 40 minutes    TREATMENT PROVIDED:   1.  Treatment description:  The patient was educated regarding the role and function of the lymphatic system, and instructed in the lymphedema management protocol of complete decongestive therapy (CDT).  This includes skin care to prevent breakdown or infection, lymphedema exercises, custom compression therapy options (bandaging, compression garments, compression pump, Lenore Cordia, [employer], [employer], etc. as needed), and decongestion with manual lymphatic drainage as indicated.  We discussed the need for consistent compression system for lymphedema management, with daytime compression garments worn and in need of replacement.  No s/s of infection noted, with pt advised to resume wear of day/night time compression garments as noted below, wearing two most recently attained garments, with good fit noted in clinic today.  Compression garment routine:  1.  Apply daytime compression stocking to clean, dry extremity first thing in the morning, EVERY MORNING.    2. Wear garment until bedtime, adjusting throughout the day as needed should garment wrinkle or crease.  Problem areas can be at joint, and top of garment, ensuring proximal aspect of garment is positioned appropriately on extremity.  3. Launder compression garments nightly either by hand or washing machine in a garment bag or pillowcase.  Use mild detergent with NO FABRIC  SOFTENER, NO BLEACH, NO WOOLITE.  Wash in cool/warm water.    4. Dry garment in dryer on low heat right side out in garment bag or pillowcase.  5. Daytime compression garments are NOT safe to sleep in, so remove at bedtime.    6. Perform skin care to extremity, cleansing skin daily with soap and water, and using low pH lotion, upward strokes to stimulate lymphatic vessels.   7. Apply night time compression garment as instructed to wear while sleeping, adjusting throughout the night as needed for comfort.  8. Perform exercise program with daytime compression stockings on.  Signs/Symptoms of infection include:  Fever, flu-like symptoms, pain/redness/warmth in extremity, sometimes with increase in swelling present.  Stop wearing your compression garment if this occurs.  Call your doctor immediately or go to the Emergency room to address potential infection.  Remove compression with changes in circulation or numbness in extremity, different from what you may experience when not wearing compression garment, pain, unexplained shortness of breath.    Treatment time:  10:42-11:00 am   18 minutes     Manual therapy1         ASSESSMENT:   Emily [name] is a 64 y.o. female who presents with stage 2 lymphedema L UE, s/p recent cellulitis infection L  UE, hospitalized at [facility] from 09/15/2015-09/19/2015, receiving IV antibiotics, continuing oral antibiotic.  Pt states she was not instructed regarding which physician to follow up with, with pt advised copy of this eval will be sent to Dr. Drucilla Schmidt for signature as he placed order for visit, and copy will be sent to [name], recommending she follow up with either or both physicians as needed regarding cellulitis.  If patient notes s/s of infection return, to report to nearest ER.  Note L UE limb volume today 11.04% discrepancy, which is mildly elevated from 8% most recent visit prior to today--see scanned graph.  However, compression garments fit  well, with pt advised to return day/night time compression garment wear, with no s/s of infection noted, and all compression garments have been laundered by patient.  Pt dons daytime garments in clinic, with good initial fit noted.  Pt to return in 1 week for re-assessment of lymphedema.  Prior to recent episode of cellulitis, patient has undergone multiple reconstructive procedures, with further surgery likely, per pt report, to promote skin closure implant L breast.  Pt has remained compliant with day/night time compression garment wear until cellulitis occurred, with patient instructed to discontinue compression during acute phase of cellulitis.  Pt continues with bilat shoulder dysfunction, orthopedic in nature, which was present prior to breast cancer diagnosis and treatment.   Home management of lymphedema concerning compression garment wear to be resumed at this time as noted above.    This care is medically necessary due to the infection risk with lymphedema, and to improve functional activities.  CDT is necessary to resolve swelling to allow patient to return to wearing normal clothes/footwear, and prevent worsening of symptoms, such as venous stasis ulcerations, infections, or hospitalizations.  Patient will be independent with home program strategies to allow improved ADL ability and mobility and to allow patient to return to greatest functional independence.    Rehabilitation potential is considered to be Good for goals for lymphedema management.  Patient lives over an hour from this clinic, is employed, and has a young son, which makes for a very busy schedule. Recommend 2-4 prn PT visits over the next 1-4 weeks for compression garment fit and management.  Pt verbalizes understanding of treatment recommendations.    PLAN OF CARE:   Recommendations and Planned Interventions:  Compression garment fitting/monitor  Lymphedema therapeutic exercise  Education in skin care and lymphedema precautions   Self-MLD education per home program  Vasopneumatic pump for home use recommended by surgeon     GOALS    1.   Patient will have knowledge of the compression options and acquire a safe and   appropriate daytime and night time compression system to prevent    re-accumulation of fluid.  09/25/2015  Current compression garments fit patient well, with pt to resume wear of most recently received day/night time compression garments.  2.  Patient or family will be able to don/doff garments independently.  Garment   system effectiveness will be evaluated prior to discharge for better long-term   management and outcomes.  3.  Improvement in L UE limb volume with reduction of 50-100 mL with resumption of day/night time compression garment wear.  4.   To transition patient to independent restorative phase of CDT.           Patient has participated in goal setting and agrees to work toward plan of care.  Patient was instructed to call if questions or concerns arise.  Thank you for this referral.  Kalman Shan, PT, CLT    Time Calculation: 60 mins    TREATMENT PLAN EFFECTIVE DATES:   09/25/2015 to 11/03/2015  I have read the above plan of care for [facility].  I certify the above prescribed services are required by this patient and are medically necessary.  The above plan of care has been developed in conjunction with the lymphedema/physical therapist.       Physician Signature: ____________________________________Date:______________

## 2015-09-30 ENCOUNTER — Ambulatory Visit: Payer: BLUE CROSS/BLUE SHIELD | Primary: Family Medicine

## 2015-10-01 ENCOUNTER — Inpatient Hospital Stay: Admit: 2015-10-01 | Payer: BLUE CROSS/BLUE SHIELD | Primary: Family Medicine

## 2015-10-01 NOTE — Progress Notes (Signed)
_                                    [facility]  [address] [place]  [address]      LYmphedema Therapy  Visit: 2            Daily note                      30 day/10th visit progress note    NAME: Emily [name]  DATE: 10/01/2015    GOALS  ??  1. Patient will have knowledge of the compression options and acquire a safe and   appropriate daytime and night time compression system to prevent    re-accumulation of fluid. 09/25/2015 Current compression garments fit patient well, with pt to resume wear of most recently received day/night time compression garments.  2. Patient or family will be able to don/doff garments independently. Garment   system effectiveness will be evaluated prior to discharge for better long-term   management and outcomes.  3. Improvement in L UE limb volume with reduction of 50-100 mL with resumption of day/night time compression garment wear. 10/01/2015 ongoing, with good progress noted.  4. To transition patient to independent restorative phase of CDT.       SUBJECTIVE REPORT:  Pt arrives with compression sleeve in place L UE.  States wear of open finger compression glove causes pain at DIP joint.  States no s/s of infection since being home.  Pt was treated with IV antibiotics in hospital for cellulitis/sepsis, and discharged on oral antibiotics.  Pt reports some irritation at finger/thumb web spaces with wear of Solaris Tribute at night, to bring to next appt for assessment.   Pain: no specific c/o regarding L UE lymphedema.           Gait:  indep  ADLs:  indep  Treatment Response:  Decision made to proceed with measurements/order silver compression garments.  See information below.    Reviewed packet received at evaluation.  Function: see eval    TREATMENT AND OBJECTIVE DATA SUMMARY:     Manual Therapy 61 minutes   Treatment time: 9:30-10:31 am  Area to decongest: L UE    Skin care: Note multiple small scabbed previously open areas L hand/forearm with patient unaware of cause.  Pt states her skin is so dry this winter, noting multiple small paper cuts L thumb.  Pt expresses concern regarding cellulitis recurrence, which is a reasonable concern.  Pt advised of benefit of ordering daytime compression garment with silver added due to anti-microbial properties of silver.  Due to recent severe cellulitis infection, this would be medically necessary for patient to prevent further infection. Pt advised to apply low pH lotion to hand/UE after handwashing and throughout the day due to skin being dry and noted small paper cuts/scabbing present.          Upper extremity compression: Measurements completed for [employer] Silver arm sleeve and full finger glove.  Pt reports open finger glove irritates DIP joints regardless of length of finger stubs, and prefers to order full finger glove.  Silver garments medically necessary due to recent history of cellulitis infection.                      Kinesiotaping: na  Girth/Volume measurement: Limb volume measurements completed with volume reduction of 47.11 mL since compression garment wear resumed.  Limb volume discrepancy reduced from 11.04% to 7.96% since last visit 09/25/2015.  See scanned graph.     TOTAL TREATMENT 61 mins       ASSESSMENT:   Treatment effectiveness and tolerance: Pt requires replacement compression garments due to limb volume and girth measurements reduced.  Silver garments medically necessary secondary to recent history of cellulitis infection, progressing to sepsis, treated with IV antibiotics in hospital, then oral antibiotics upon discharge.   Progress toward goals: Improvement in limb volume as noted above.  Measurements for silver compression garments for daytime wear completed with order to be sent.     PLAN OF CARE:   Changes to the plan of care: Order replacement daytime compression  garments, fit in clinic.  Assess fit and make clinic adjustments as able to [employer] to improve comfort.  Garment may require return to [employer] for alterations.   Frequency: prn   Other: na       Kalman Shan, PT, CLT

## 2015-10-06 NOTE — Telephone Encounter (Signed)
Patient called requesting a refill on her medication that helps with her cellulitis. She is worried that she will be getting a flair up. Call back number [phone]

## 2015-10-06 NOTE — Telephone Encounter (Signed)
Called the patient and verified ID x 2.  The patient stated that she was hospitalized for "severe cellulitis last December and was prescribed three antibiotics" and placed on Augmentin for twenty days following her hospitalization and was instructed to follow up with her oncologist for further management.  The patient requested to be prescribed Augmentin to have "on hand" to help prevent cellulitis from occurring again.  Informed the patient that per [name] the best practice is to evaluate and see the patient and prescribe antibiotics at the time cellulitis is occurring and that there are no guidelines to prescribe antibiotics to have "on hand" as they may be the wrong antibiotic and that if she felt cellulitis was occurring again that she could call Dr. Valinda Hoar office to be seen or go to the emergency room if necessary and that because the cellulitis happened so quickly that a pill antibiotic would not have stopped the onset of cellulitis.  The patient was not agreeable to Dr. Valinda Hoar recommendations and stated that "I am going to have to re-evaluate this situation as [name] is not interested in my preventative care" and hung up the phone.

## 2015-10-09 NOTE — Telephone Encounter (Signed)
Administrator, sports Oncology at [place] pt to check in and see how she was doing since her last visit with [place]. Pt had called recently requesting an antibiotic and appeared upset after she was told we would prefer to see pt if this type of treatment was needed. Asked pt to help me to understand what she was thinking, feeling when she made this call. Pt explained that two days after seeing [name] in Dec, she was hospitalized for treatment of cellulitis and that the infectious disease doctor recommended she have Augmentin "on hand". Explained that we do not have these records and that we have not seen this recommendation. Also explained that [name] is certainly concerned for pt's well-being-which is why he would prefer to see pt and prescribe any needed antibiotic, med, etc rather than pt having something "on hand." Pt voiced understanding of this. Explained to pt that we will obtain records for [name] to review and encouraged pt to call with any additional questions, concerns. Confirmed pt's next appt with [name] is in 6 months and encouraged her to call with any questions, concerns as needed. Pt voiced understanding and is in agreement with this plan.     Records can be obtained from:      [facility]  Phone number medical records [phone]  Fax number medical records [phone]  Date of service 09/16/15-09/19/15

## 2015-10-23 NOTE — Telephone Encounter (Signed)
Patient called to schedule an appointment with [name] on Monday, 10/26/15 at 9:45AM to discuss some pain she has been experiencing in her muscles in her upper body (arms) and also fatigue. She stated that it feels like the "after shock" feeling she had after she got her Neulasta shot.

## 2015-10-26 ENCOUNTER — Encounter: Attending: Nurse Practitioner | Primary: Family Medicine

## 2015-10-26 MED ORDER — SERTRALINE 100 MG TAB
100 mg | ORAL_TABLET | ORAL | 3 refills | Status: DC
Start: 2015-10-26 — End: 2015-10-30

## 2015-10-26 NOTE — Telephone Encounter (Signed)
[name] and Abel Presto, NP aware of the patient's new appointment date and time.

## 2015-10-26 NOTE — Telephone Encounter (Signed)
Patient called to reschedule her appointment with [name] to next Wednesday, 11/04/15 at 8:45AM due to her son being sick and vomiting today. She will be taking him to the doctor.

## 2015-10-30 ENCOUNTER — Ambulatory Visit
Admit: 2015-10-30 | Discharge: 2015-10-30 | Payer: PRIVATE HEALTH INSURANCE | Attending: Internal Medicine | Primary: Family Medicine

## 2015-10-30 DIAGNOSIS — J028 Acute pharyngitis due to other specified organisms: Secondary | ICD-10-CM

## 2015-10-30 MED ORDER — ONDANSETRON 4 MG TAB, RAPID DISSOLVE
4 mg | ORAL_TABLET | Freq: Three times a day (TID) | ORAL | 1 refills | Status: DC | PRN
Start: 2015-10-30 — End: 2018-03-28

## 2015-10-30 MED ORDER — AMOXICILLIN CLAVULANATE 875 MG-125 MG TAB
875-125 mg | ORAL_TABLET | Freq: Two times a day (BID) | ORAL | 0 refills | Status: DC
Start: 2015-10-30 — End: 2016-03-16

## 2015-10-30 NOTE — Telephone Encounter (Signed)
Patient called and stated that she needed to cancel her appointment. Patient stated she will be having blood work done and she is  Not doing well. Patient stated she will f/u later.

## 2015-10-30 NOTE — Progress Notes (Signed)
HISTORY OF PRESENT ILLNESS  Emily [name] is a 65 y.o. female.  HPI  Emily [name] comes in asking me to give her Augmentin.  She notes she was hospitalized for 20 days with sepsis and was on antibiotics that entire time.  She was discharged on 20 days of Augmentin and felt okay.  Early January she came off the Augmentin and began to feel sick again with sore throat, low grade fever or 99, fatigue, aches and nausea.  She saw her ENT, who put her on seven days of Augmentin for a staph infection around her nose and states she felt good again on the Augmentin.  She is worried she has ongoing infection in her blood stream.  Also feels that her stomach is inflamed and needs to reschedule the HIDA scan that has been ordered.  Does not associate these symptoms with the increase in Zoloft.  We went from 100 to 125 and she is tolerating that.  Also states she had a GI bug this week which her son also had with some diarrhea and nausea.  She is traveling and wants to travel with Zofran and Augment in case she gets sick in March.      Review of Systems   Constitutional: Positive for fever and malaise/fatigue. Negative for chills and weight loss.   HENT: Positive for sore throat. Negative for congestion and ear pain.    Respiratory: Negative for cough, shortness of breath and wheezing.    Cardiovascular: Negative for chest pain, palpitations, orthopnea, leg swelling and PND.   Gastrointestinal: Positive for abdominal pain and nausea. Negative for blood in stool, diarrhea and heartburn.   Musculoskeletal: Positive for joint pain and myalgias.   Neurological: Negative for dizziness and headaches.   Psychiatric/Behavioral: Negative for depression. The patient is nervous/anxious.        Physical Exam   Constitutional: She is oriented to person, place, and time. She appears well-developed and well-nourished.   HENT:   Head: Normocephalic.   Right Ear: Tympanic membrane, external ear and ear canal normal.    Left Ear: Tympanic membrane, external ear and ear canal normal.   Nose: Nose normal.   Mouth/Throat: Oropharynx is clear and moist and mucous membranes are normal. No oropharyngeal exudate.   Eyes: Conjunctivae are normal. Pupils are equal, round, and reactive to light. Right eye exhibits no discharge. Left eye exhibits no discharge.   Neck: Normal range of motion. Neck supple.   Cardiovascular: Normal rate, regular rhythm and normal heart sounds.    Pulmonary/Chest: Effort normal and breath sounds normal. No respiratory distress. She has no wheezes. She has no rales.   Abdominal: Soft. Bowel sounds are normal. There is tenderness (diffuse discomfort).   Musculoskeletal: She exhibits no edema.   Lymphadenopathy:     She has no cervical adenopathy.   Neurological: She is alert and oriented to person, place, and time.   Skin: Skin is warm and dry. No erythema.   Nursing note and vitals reviewed.      ASSESSMENT and PLAN  Emily [name] was seen today for medication evaluation, anxiety and blood infection.    Diagnoses and all orders for this visit:    Pharyngitis due to other organism  -     amoxicillin-clavulanate (AUGMENTIN) 875-125 mg per tablet; Take 1 Tab by mouth every twelve (12) hours.    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  -     METABOLIC PANEL, COMPREHENSIVE    Sepsis, due to unspecified organism ([place])  -     CULTURE, BLOOD  -     CBC WITH AUTOMATED DIFF  -     CULTURE, BLOOD    Anxiety    mx sxs and I do not think it is likely that she has ongoing bacteremia-she is very concerned and will order blood cultures and did write 7 days of augmentin to take on trip in march but explained that i would not take med at this time for these sxs unless infection found  Advised oncology follow up and reschedule hida scan

## 2015-10-31 LAB — METABOLIC PANEL, COMPREHENSIVE
A-G Ratio: 1.8 (ref 1.1–2.5)
ALT (SGPT): 18 IU/L (ref 0–32)
AST (SGOT): 25 IU/L (ref 0–40)
Albumin: 4.4 g/dL (ref 3.6–4.8)
Alk. phosphatase: 49 IU/L (ref 39–117)
BUN/Creatinine ratio: 14 (ref 11–26)
BUN: 7 mg/dL — ABNORMAL LOW (ref 8–27)
Bilirubin, total: <0.2 mg/dL (ref 0.0–1.2)
CO2: 25 mmol/L (ref 18–29)
Calcium: 8.2 mg/dL — ABNORMAL LOW (ref 8.7–10.3)
Chloride: 100 mmol/L (ref 96–106)
Creatinine: 0.49 mg/dL — ABNORMAL LOW (ref 0.57–1.00)
GFR est AA: 121 mL/min/{1.73_m2} (ref >59)
GFR est non-AA: 105 mL/min/{1.73_m2} (ref >59)
GLOBULIN, TOTAL: 2.5 g/dL (ref 1.5–4.5)
Glucose: 79 mg/dL (ref 65–99)
Potassium: 3.9 mmol/L (ref 3.5–5.2)
Protein, total: 6.9 g/dL (ref 6.0–8.5)
Sodium: 140 mmol/L (ref 134–144)

## 2015-10-31 LAB — CBC WITH AUTOMATED DIFF
ABS. BASOPHILS: 0 10*3/uL (ref 0.0–0.2)
ABS. EOSINOPHILS: 0.1 10*3/uL (ref 0.0–0.4)
ABS. IMM. GRANS.: 0 10*3/uL (ref 0.0–0.1)
ABS. MONOCYTES: 0.5 10*3/uL (ref 0.1–0.9)
ABS. NEUTROPHILS: 1.8 10*3/uL (ref 1.4–7.0)
Abs Lymphocytes: 1.1 10*3/uL (ref 0.7–3.1)
BASOPHILS: 0 %
EOSINOPHILS: 2 %
HCT: 40.3 % (ref 34.0–46.6)
HGB: 13.2 g/dL (ref 11.1–15.9)
IMMATURE GRANULOCYTES: 0 %
Lymphocytes: 32 %
MCH: 29.4 pg (ref 26.6–33.0)
MCHC: 32.8 g/dL (ref 31.5–35.7)
MCV: 90 fL (ref 79–97)
MONOCYTES: 14 %
NEUTROPHILS: 52 %
PLATELET: 214 10*3/uL (ref 150–379)
RBC: 4.49 x10E6/uL (ref 3.77–5.28)
RDW: 14.5 % (ref 12.3–15.4)
WBC: 3.5 10*3/uL (ref 3.4–10.8)

## 2015-11-01 NOTE — Progress Notes (Signed)
Notified by letter.

## 2015-11-02 NOTE — Telephone Encounter (Signed)
Per patient she would like her lab results and also fax a Copy to Dr Terrilyn Saver at [phone]   Pt no is [phone]

## 2015-11-02 NOTE — Telephone Encounter (Signed)
L/vm to advise of letter sent and normal labs so far, waiting for culture will provide results to requested provider when all labs are back. May return my call if further asst is needed at this time.

## 2015-11-04 ENCOUNTER — Encounter: Attending: Nurse Practitioner | Primary: Family Medicine

## 2015-11-04 NOTE — Telephone Encounter (Signed)
Spoke to pt, advised blood cx report has not been resulted yet but as soon as the result come through, we can notify her, she verbalized understanding.

## 2015-11-04 NOTE — Telephone Encounter (Signed)
Pt requesting a return call from the nurse to discuss her culture results that she was told would be in yesterday [phone]

## 2015-11-05 LAB — CULTURE, BLOOD
Blood culture result: NO GROWTH
Blood culture result: NO GROWTH

## 2015-11-05 NOTE — Progress Notes (Signed)
Notify her of negative blood cultures -no growth

## 2015-11-05 NOTE — Progress Notes (Signed)
Patient notified blood cultures were negative. Copy of labs will be faxed to Dr.Meyer.

## 2015-11-12 ENCOUNTER — Encounter

## 2015-11-12 MED ORDER — SERTRALINE 25 MG TAB
25 mg | ORAL_TABLET | ORAL | 2 refills | Status: DC
Start: 2015-11-12 — End: 2016-03-07

## 2015-11-30 ENCOUNTER — Encounter: Attending: Internal Medicine | Primary: Family Medicine

## 2015-12-23 ENCOUNTER — Encounter

## 2015-12-23 MED ORDER — FARESTON 60 MG TABLET
60 mg | ORAL_TABLET | ORAL | 1 refills | Status: DC
Start: 2015-12-23 — End: 2016-01-04

## 2015-12-25 ENCOUNTER — Ambulatory Visit: Payer: BLUE CROSS/BLUE SHIELD | Primary: Family Medicine

## 2015-12-30 NOTE — Telephone Encounter (Signed)
Patient called and stated that she went to the dentist and she has to have a crown removed and has decay on the tooth under it. Patient also stated that she will need a root canal and wants to know if she needs to start an antibiotic. CB# [phone]

## 2015-12-30 NOTE — Telephone Encounter (Signed)
Called the patient and verified ID x 2.  The patient stated that she has been in "severe" jaw pain and went to the dentist and had some x-rays completed that show an abscess under her crown and that her dentist recommended that she contact her oncologist to see if antibiotics are indicated.  Informed the patient that per [name] and Abel Presto, NP that antibiotics are not indicated at this time.  The patient stated that the dentist is unsure how long the abscess has been there and the patient stated concern that when they start working on her the tooth, jaw, and gums that there will be a "blast of bacteria" and inquired if she should be placed on Clindamycin.  Informed the patient that [name] and Abel Presto, NP will be made aware of the patient's questions and concerns and this office will call the patient back with recommendations.  The patient verbalized understanding and denied any further questions or concerns.    Planned for Monday    Not indicated at this time unless dentist recommends and they can order antibiotics

## 2016-01-01 ENCOUNTER — Inpatient Hospital Stay: Admit: 2016-01-01 | Payer: BLUE CROSS/BLUE SHIELD | Attending: Nurse Practitioner | Primary: Family Medicine

## 2016-01-01 DIAGNOSIS — C50912 Malignant neoplasm of unspecified site of left female breast: Secondary | ICD-10-CM

## 2016-01-01 MED ORDER — GADOBUTROL 7.5 MMOL/7.5 ML (1 MMOL/ML) IV
7.5 mmol/ mL (1 mmol/mL) | Freq: Once | INTRAVENOUS | Status: AC
Start: 2016-01-01 — End: 2016-01-01
  Administered 2016-01-01: 14:00:00 via INTRAVENOUS

## 2016-01-04 ENCOUNTER — Encounter

## 2016-01-04 ENCOUNTER — Encounter: Attending: Internal Medicine | Primary: Family Medicine

## 2016-01-04 MED ORDER — FARESTON 60 MG TABLET
60 mg | ORAL_TABLET | ORAL | 5 refills | Status: DC
Start: 2016-01-04 — End: 2016-05-30

## 2016-01-04 NOTE — Telephone Encounter (Signed)
Called the patient and verified ID x 2.  Informed the patient that Abel Presto, NP sent in a prescription with five refills and that [name] and Abel Presto, NP recommend that the dentist prescribe antibiotics if they are concerned about infection once they see the condition of the teeth and gums under the crown.  The patient verbalized understanding and denied any further questions or concerns.

## 2016-01-04 NOTE — Telephone Encounter (Signed)
Patient called and stated that [facility] has faxed a refill request twice to our office and she would like to know if she can have refills on the medication so that she does not have to call every month. Patient also stated that she is supposed to have two root canals and is worried about an infection spreading. CB# [phone].

## 2016-01-04 NOTE — Progress Notes (Signed)
Please let her know this is normal. We will keep an eye out for the post contrast imaging. Thank you!

## 2016-01-08 ENCOUNTER — Inpatient Hospital Stay: Attending: Nurse Practitioner | Primary: Family Medicine

## 2016-01-08 ENCOUNTER — Encounter

## 2016-01-08 DIAGNOSIS — Z853 Personal history of malignant neoplasm of breast: Secondary | ICD-10-CM

## 2016-01-22 ENCOUNTER — Encounter: Attending: Surgery | Primary: Family Medicine

## 2016-03-02 ENCOUNTER — Encounter: Attending: Surgery | Primary: Family Medicine

## 2016-03-07 ENCOUNTER — Encounter

## 2016-03-07 MED ORDER — SERTRALINE 25 MG TAB
25 mg | ORAL_TABLET | Freq: Every day | ORAL | 0 refills | Status: DC
Start: 2016-03-07 — End: 2016-03-16

## 2016-03-11 ENCOUNTER — Encounter: Attending: Surgery | Primary: Family Medicine

## 2016-03-14 ENCOUNTER — Encounter: Attending: Specialist | Primary: Family Medicine

## 2016-03-16 ENCOUNTER — Ambulatory Visit: Admit: 2016-03-16 | Payer: PRIVATE HEALTH INSURANCE | Attending: Specialist | Primary: Family Medicine

## 2016-03-16 DIAGNOSIS — C50312 Malignant neoplasm of lower-inner quadrant of left female breast: Secondary | ICD-10-CM

## 2016-03-16 NOTE — Progress Notes (Signed)
Emily [name] is a 65 y.o. female here for follow-up of breast cancer.

## 2016-03-16 NOTE — Progress Notes (Signed)
[facility]  [address], [place]   [address]  W: [phone]   F: [phone]      f/u HEME/ONC CONSULT    Reason for visit: evaluation for treatment for  Breast Cancer    Consulting physician:  Dr. Gilford Rile, Dr. Raynald Blend, Brooke Bonito.  Ref physician:  Dr. Drucilla Schmidt    HPI:   Emily [name] is a 65 y.o.  female who I am seeing for adjuvant therapy for breast cancer.      She had a screening ultrasound on 09/24/12 showing a 2.6 cm left LN.  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  HER 2 negative IHC at 0.    She has a history of a pheochromocytoma surgically removed in 1997.    She then had a MRI breast on 10/05/12 showing the 2.8 x 2 cm LN as well as a left breast LIQ 0.9 cm x 1.1 cm x 0.6 cm mass.  Biopsy of that mass by FNA on 10/16/12 shows a poorly differentiated carcinoma.  These slides were air-dried, HER 2 not able to be performed on them.    Mammaprint of LN shows high risk luminal, ER + at 0.19, PR + at 0.61, HER 2 negative at -0.69.    She underwent L mastectomy on 11/28/12 showing IDC with neuroendocrine features, gr 2, 0.5 cm, DCIS gr 2-3, + LVI, 1/14 LN + with a 1 cm met, no ECE.  HER 2 negative (ratio 1.2, sig/cell 2.8), ER + 95%, PR + 5%; ki67 5%; DCIS ER + 95%, PR + 1%.    S/p TC q 3 weeks x 4 from 12/25/12-02/26/13 (75 mg/m2; 600 mg/m2)    S/p XRT from 04/02/13-05/10/13    Started anastrozole 05/17/13, stop 07/30/13 (joint pains)    exemestane 08/06/13-09/30/13 (joint pains)    Started zometa 07/10/13, q 6 months x 3 years, stopped     Started tamoxifen 09/30/13, stopped on 03/08/14 due to joint pains    Started letrozole 03/23/14, stopped 09/18/14 due to joint pains     Started tamoxifen 10/04/14, stopped on 12/15/14 due to hot flashes and mood swings.    Anastrozole 12/16/14-01/30/15, stopped due to headache and dizziness    toremefine 02/09/15-    Interval History: Continue toremefine, tolerating well. complains of gr 1 loss of appetite, gr 1 constipation, gr 2 fatigue, gr 1 hot flashes, gr 1 insomnia, gr 1 anxiety, 8/10 right shoulder pain, gr 1 sob, gr 2 headache, gr 1 left arm/hand swelling, gr 1 vaginal dryness.    Complains of intermittent wheezing.    LMP 1996.    DX   Encounter Diagnoses   Name Primary?   ??? Malignant neoplasm of lower-inner quadrant of left female breast ([place]) Yes   ??? Rheumatoid arthritis, involving unspecified site, unspecified rheumatoid factor presence ([place])    ??? Age-related osteoporosis without current pathological fracture    ??? Wheezing    ??? Chronic nonintractable headache, unspecified headache type    ??? Chronic arthralgias of knees and hips       Past Medical History:   Diagnosis Date   ??? Anxiety    ??? Arthritis    ??? Cancer ([place])     left breast  cancer   ???  Chronic tension headaches    ??? Concussion 2011   ??? Headache     OCCAS   ??? Ill-defined condition     cellulitis L UE   ??? Lymphedema of arm     left   ??? Nausea & vomiting    ??? Other and unspecified symptoms and signs involving general sensations and perceptions     HIVES RECENTLY- WAS IN ER Feb 19, 2013   ??? Other ill-defined conditions     PHEOCHROMOCYTOMA     Past Surgical History:   Procedure Laterality Date   ??? ABDOMEN SURGERY PROC UNLISTED      hernia mesh repair   ??? ABDOMEN SURGERY PROC UNLISTED      right adenal removed   ??? BREAST SURGERY PROCEDURE UNLISTED  '91    breast implants; replaced bilat implants 2011   ??? HX APPENDECTOMY     ??? HX BREAST AUGMENTATION Left 04/22/2014    REMOVAL OF LEFT BREAST IMPLANT, WASHOUT AND CLOSURE OF LEFT BREAST WOUND performed by Sharlotte Alamo, MD at [facility]   ??? HX BREAST RECONSTRUCTION  11/28/2012     BREAST RECONSTRUCTION /C  INSERTION EXPANDER & ALLODERM performed by Sharlotte Alamo, MD at [facility]   ??? HX BREAST RECONSTRUCTION Bilateral 09/16/2013    REMOVAL AND REPLACEMENT LEFT BREAST IMPLANT TISSUE EXPANDERS WITH SUBTOTAL CAPSULECTOMY, RIGHT BREAST IMPLANT REMOVAL AND REPLACEMENT FOR SYMMETRY/FAT GRAFTING TO BILATERAL BREASTS performed by Sharlotte Alamo, MD at [facility]   ??? HX GYN      c-section   ??? HX HEENT      T&A   ??? HX HEENT      right adrenal removed   ??? HX MASTECTOMY Left    ??? HX MODIFIED RADICAL MASTECTOMY  11/28/2012    LEFT BREAST MODIFIED RADICAL MASTECTOMY WITH AXILLARY DISSECTION W/ RECONSTRUCTION LEFT BREAST WITH TISSUE EXPANDER AND ALLODERM;PORTACATH INSERTION performed by Ty Hilts, MD at [facility]   ??? HX OOPHORECTOMY      right   ??? HX ORTHOPAEDIC      bilateral shoulder arthroscopy   ??? HX VASCULAR ACCESS      PORT, THEN REMOVED     Social History     Social History   ??? Marital status: SINGLE     Spouse name: N/A   ??? Number of children: N/A   ??? Years of education: N/A     Social History Main Topics   ??? Smoking status: Never Smoker   ??? Smokeless tobacco: Never Used   ??? Alcohol use No   ??? Drug use: No   ??? Sexual activity: Not Asked     Other Topics Concern   ??? None     Social History Narrative     Family History   Problem Relation Age of Onset   ??? Alzheimer Mother    ??? Diabetes Mother    ??? Other Mother      COMBATIVE POST ANESTHESIA   ??? Heart Disease Father    ??? Diabetes Father    ??? Lung Disease Father      COPD   ??? Headache Sister    ??? Migraines Sister    ??? Anesth Problems Neg Hx        Current Outpatient Prescriptions   Medication Sig Dispense Refill   ??? FARESTON 60 mg tab TAKE 60 MG BY MOUTH DAILY. 30 Tab 5   ??? sertraline (ZOLOFT) 100 mg tablet  TAKE 1 TAB BY MOUTH DAILY. 30 Tab 3   ??? lidocaine (LIDODERM) 5 %(700 mg/patch)   2   ??? BOTOX 200 unit injection      ??? zolpidem (AMBIEN) 10 mg tablet TAKE 1 TABLET BY MOUTH AT BEDTIME  3    ??? ondansetron (ZOFRAN ODT) 4 mg disintegrating tablet Take 1 Tab by mouth every eight (8) hours as needed for Nausea. 12 Tab 1   ??? diazePAM (VALIUM) 5 mg tablet Take 1 Tab by mouth every six (6) hours as needed for Anxiety. Max Daily Amount: 20 mg. Indications: MUSCLE SPASM 30 Tab 0   ??? butalbital-acetaminophen-caff (FIORICET) 50-300-40 mg per capsule as needed.     ??? codeine-butalbital-acetaminophen-caffeine (FIORICET WITH CODEINE) 50-325-40-30 mg per capsule      ??? valACYclovir (VALTREX) 1 gram tablet Take 2 tabs by mouth twice a day   Indications: PREVENTION OF HERPES ZOSTER IN IMMUNOCOMPROMISED PATIENT 120 Tab 5   ??? oxyCODONE IR (ROXICODONE) 5 mg immediate release tablet Take 5 mg by mouth every four (4) hours as needed for Pain.         Allergies   Allergen Reactions   ??? Aspirin Unknown (comments)     Gi bleeding   ??? Compazine [Prochlorperazine] Rash   ??? Keflex [Cephalexin] Rash   ??? Nsaids (Non-Steroidal Anti-Inflammatory Drug) Swelling     Throat swells       Review of Systems    A comprehensive review of systems was performed and all systems were negative except for HPI and for the symptom report form, reviewed and scanned in.      Objective:  Physical Exam:  Visit Vitals   ??? BP 134/83   ??? Pulse 65   ??? Temp 97.1 ??F (36.2 ??C) (Temporal)   ??? Resp 20   ??? Ht '5\' 6"'$  (1.676 m)   ??? Wt 119 lb 3.2 oz (54.1 kg)   ??? SpO2 98%   ??? BMI 19.24 kg/m2       General:  Alert, cooperative, no distress, appears stated age.   Head:  Normocephalic, without obvious abnormality, atraumatic.   Eyes:  Conjunctivae/corneas clear. PERRL, EOMs intact.   Throat: Lips, mucosa, and tongue normal.    Neck: Supple, symmetrical, trachea midline, no adenopathy, thyroid: no enlargement/tenderness/nodules   Back:   Symmetric, no curvature. ROM normal. No CVA tenderness.   Lungs:   Clear to auscultation bilaterally.   Chest wall:  S/p bilateral mastectomy with reconstruction.    Heart:  Regular rate and rhythm, S1, S2 normal, no murmur, click, rub or  gallop.   Abdomen:   Soft, non-tender. Bowel sounds normal. No masses,  No organomegaly.   Extremities:  Extremities normal, atraumatic, no cyanosis.   Skin: No rash   Lymph nodes: Cervical, supraclavicular, and axillary nodes normal.   Neurologic: CNII-XII intact.       Diagnostic Imaging   10/12/12 PET:  Negative for distant mets  10/22/12 brain MRI:  Negative    09/24/12 dexa L hip T -2.5; L spine T -2.3  osteoporosis    12/11/13 bilateral MRI  Negative    03/26/14 MRI Brain  Negative    12/05/14 MRI breast   Negative    12/05/14 [place] L breast?? ????  IMPRESSION:  1. Very small, hypoechoic lesion in continuity and immediately below the ??  dermis overlying the sternum/medialmost left breast. Finding suggests dermal ??  lesion such as epidermal inclusion cyst versus postoperative change.  BI-RADS Category 2 -  Benign findings. ??    01/01/16 breast MRI  Negative    Lab Results  Lab Results   Component Value Date/Time    WBC 3.5 10/30/2015 09:13 AM    HGB 13.2 10/30/2015 09:13 AM    HCT 40.3 10/30/2015 09:13 AM    PLATELET 214 10/30/2015 09:13 AM    MCV 90 10/30/2015 09:13 AM       Lab Results   Component Value Date/Time    Sodium 140 10/30/2015 09:13 AM    Potassium 3.9 10/30/2015 09:13 AM    Chloride 100 10/30/2015 09:13 AM    CO2 25 10/30/2015 09:13 AM    Anion gap 10 02/05/2013 09:44 AM    Glucose 79 10/30/2015 09:13 AM    BUN 7 10/30/2015 09:13 AM    Creatinine 0.49 10/30/2015 09:13 AM    BUN/Creatinine ratio 14 10/30/2015 09:13 AM    GFR est AA 121 10/30/2015 09:13 AM    GFR est non-AA 105 10/30/2015 09:13 AM    Calcium 8.2 10/30/2015 09:13 AM    AST (SGOT) 25 10/30/2015 09:13 AM    Alk. phosphatase 49 10/30/2015 09:13 AM    Protein, total 6.9 10/30/2015 09:13 AM    Albumin 4.4 10/30/2015 09:13 AM    Globulin 2.4 02/05/2013 09:44 AM    A-G Ratio 1.8 10/30/2015 09:13 AM    ALT (SGPT) 18 10/30/2015 09:13 AM     Assessment/Plan:  65 y.o. female with a 0.5 cm left breast cancer, IDC with endocrine  features, 1/14 LN +, gr 2, high risk mammaprint,  ER and PR +, HER 2 negative. PS 0    1. Breast cancer with neuroendocrine features, Left, lower inner quadrant, stage IIA, [id number]    With no further therapy, her RR is about 36%; endocrine therapy will decrease her RR to 19%; and 2nd gen chemo will decrease her RR to 12%; 3rd gen to 10%.  These #s may be higher with her high risk mammaprint.     Based on her pheo and now breast cancer history, I recommended genetic counseling -- referral to [facility] made at prior visit. Has seen them and they have performed BRCA testing -- negative.  [facility] has run a PGL panel on 09/10/14.    No evidence of recurrence, continue toremifene.       Last eye exam: 10/2015  Last gyn exam: 06/2015 -- has vaginal [place] in 3 weeks    Due for imaging in March 2018, MRI breast. Patient is unable to tolerate mammogram due to pain.     Will see her back in 6 months.     2. Anxiety/Depression: Stable today; continue sertraline 100 mg daily. Valium prn.  She has seen Renee Ramus previously. I advised that Dr. Wanda Plump should manage her anti depressant. She is interested in switching to a new drug called Triltellix.     3. Osteoporosis: Had received zometa previously, which was give for one dose on 07/10/13. Switched to prolia 60 mg SC q 6 months, received 04/08/14,10/01/14, 03/10/15, 09/14/15. She has one remaining Prolia injection, but she is planning oral surgery on July 17, will discontinue at this time.     DEXA 10/01/14 at the [facility], patient reports improvement, she will get this done 10/01/16 there      Vit D3 2000 International Units daily, advised her to start this today, discussed again today.     4. Joint pains, knees and hips: improved on toremifene    5. Fibromyalgia/RA:  Not currently on any treatment, discussed with patient that there are medical options to assist with her pain. She will follow up with her Rheumatologist. She took MTX prior to cancer diagnosis which she  did not tolerate.  Previously did not tolerate lyrica due to sedation.      6. Headaches: improved on toremifene, 12/11/13 Brain MRI negative, following with Neurology, has received Botox injections previously. stable    7. Wheezing:  New; Will get CXR to evaluate    > 25 minutes were spent with this patient with > 50% of that time spent in face to face counseling.              Follow-up Disposition:  Return in about 7 months (around 10/16/2016).    Sonda Rumble, MD

## 2016-03-16 NOTE — Telephone Encounter (Signed)
Patient called and stated she has dexa bone scan scheduled for Dec. 30, That's a Saturday. Patient would like to know if we can reschedule that for Dec. 29. Patient also stated that her insurance needs a pre authorization. Patient also stated that she would like our office to mail the orders, verification of the prior auth.CB [phone]

## 2016-03-16 NOTE — Patient Instructions (Signed)
Have your bone density done 10/01/16 at [place] [employer] daily, advised her to start this today.

## 2016-03-21 ENCOUNTER — Encounter: Attending: Surgery | Primary: Family Medicine

## 2016-03-29 ENCOUNTER — Inpatient Hospital Stay: Admit: 2016-03-29 | Payer: BLUE CROSS/BLUE SHIELD | Primary: Family Medicine

## 2016-03-29 DIAGNOSIS — I890 Lymphedema, not elsewhere classified: Secondary | ICD-10-CM

## 2016-03-29 NOTE — Progress Notes (Signed)
[address]  [place]  [address]      EVALUATION    NAME: Emily [name] AGE: 65 y.o.  GENDER: female  DATE: 03/29/2016  REFERRING PHYSICIAN:  Michaele Offer, MD  HISTORY AND BACKGROUND:  Pt is a 65 y/o female returns for evaluation of L UE lymphedema.  Pt states she is continuing with wear of day/night time compression garment, stating her daytime garments are old and in need of replacement.  Pt has history of L UE cellulitis 09/15/2015, requiring inpatient treatment with multiple IV antibiotics, with Vancomycin seeming to be the most effective.  Pt states she is doing well with management of lymphedema.  Does report overall joint pain, including bilateral shoulders, which continues to limit UE function.    Breast cancer history at time of initial evaluation as follows:    Pt found to have 2.6 cm LN L axilla 09/24/2012.  Underwent biopsy on 10/02/2012 with findings of metastatic neoplasm.  MRI on 10/05/2012 revealed L breast mass in addition to lymph node.  Biopsy of breast mass on 10/16/2012 showed poorly differentiated carcinoma.  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  Pt completed chemotherapy and XRT.  Pt received care to address L UE lymphedema at [facility], prior to coming to this clinic. Pt presented at initial eval with pain L axilla, reporting "fluid pocket" in region, severe pain with shoulder movement, and feeling of "fullness" L upper arm. Pt stated symptoms presented post surgery.  Pt reports bilat shoulder dysfunction prior to cancer diagnosis, having undergone R rotator cuff repair 7 years ago, L rotator cuff repair 3 years ago. Pt is s/p bilateral breast  reconstruction 09/16/2013.  States additional reconstructive surgery scheduled for 01/30/2014.  Pt underwent multiple surgeries for reconstruction bilateral breasts, reduction with implant placement R breast, small implant placement with fat grafting L breast.  Pt states continued concerns regarding healing L implant was tenuous, and she has refrained from use of vasopneumatic pump to address lymphedema per Dr. Benjaman Lobe order.  Pt reports she has been compliant with day/night time compression garment wear L UE, as instructed.    Primary Diagnosis:  ?? 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 Malignant neoplasm of breast (C50.2)  Date of Onset: 11/29/2011  Present Symptoms and Functional Limitations: Pain L axilla/upper arm/upper quadrant anterior/lateral aspect.  L UE lymphedema.  LLIS lymphedema outcomes measure:  26/72; 38% impairment.  Past Medical History:   Past Medical History:   Diagnosis Date   ??? Anxiety    ??? Arthritis    ??? Cancer ([place])     left breast  cancer   ??? Chronic tension headaches    ??? Concussion 2011   ??? Headache     OCCAS   ??? Ill-defined condition     cellulitis L UE   ??? Lymphedema of arm     left   ??? Nausea & vomiting    ??? Other and unspecified symptoms and signs involving general sensations and perceptions     HIVES RECENTLY- WAS IN ER Feb 19, 2013   ??? Other ill-defined conditions     PHEOCHROMOCYTOMA     Past Surgical History:   Procedure Laterality  Date   ??? ABDOMEN SURGERY PROC UNLISTED      hernia mesh repair   ??? ABDOMEN SURGERY PROC UNLISTED      right adenal removed   ??? BREAST SURGERY PROCEDURE UNLISTED  '91    breast implants; replaced bilat implants 2011   ??? HX APPENDECTOMY     ??? HX BREAST AUGMENTATION Left 04/22/2014    REMOVAL OF LEFT BREAST IMPLANT, WASHOUT AND CLOSURE OF LEFT BREAST WOUND performed by Sharlotte Alamo, MD at [place]   ??? HX BREAST RECONSTRUCTION  11/28/2012     BREAST RECONSTRUCTION /C  INSERTION EXPANDER & ALLODERM performed by Sharlotte Alamo, MD at [place]   ??? HX BREAST RECONSTRUCTION Bilateral 09/16/2013    REMOVAL AND REPLACEMENT LEFT BREAST IMPLANT TISSUE EXPANDERS WITH SUBTOTAL CAPSULECTOMY, RIGHT BREAST IMPLANT REMOVAL AND REPLACEMENT FOR SYMMETRY/FAT GRAFTING TO BILATERAL BREASTS performed by Sharlotte Alamo, MD at [place]   ??? HX GYN      c-section   ??? HX HEENT      T&A   ??? HX HEENT      right adrenal removed   ??? HX MASTECTOMY Left    ??? HX MODIFIED RADICAL MASTECTOMY  11/28/2012    LEFT BREAST MODIFIED RADICAL MASTECTOMY WITH AXILLARY DISSECTION W/ RECONSTRUCTION LEFT BREAST WITH TISSUE EXPANDER AND ALLODERM;PORTACATH INSERTION performed by Ty Hilts, MD at [place]   ??? HX OOPHORECTOMY      right   ??? HX ORTHOPAEDIC      bilateral shoulder arthroscopy   ??? HX VASCULAR ACCESS      PORT, THEN REMOVED     Current Medications:    Current Outpatient Prescriptions   Medication Sig   ??? zolpidem (AMBIEN) 10 mg tablet TAKE 1 TABLET BY MOUTH AT BEDTIME   ??? FARESTON 60 mg tab TAKE 60 MG BY MOUTH DAILY.   ??? ondansetron (ZOFRAN ODT) 4 mg disintegrating tablet Take 1 Tab by mouth every eight (8) hours as needed for Nausea.   ??? diazePAM (VALIUM) 5 mg tablet Take 1 Tab by mouth every six (6) hours as needed for Anxiety. Max Daily Amount: 20 mg. Indications: MUSCLE SPASM   ??? butalbital-acetaminophen-caff (FIORICET) 50-300-40 mg per capsule as needed.   ??? codeine-butalbital-acetaminophen-caffeine (FIORICET WITH CODEINE) 50-325-40-30 mg per capsule    ??? valACYclovir (VALTREX) 1 gram tablet Take 2 tabs by mouth twice a day   Indications: PREVENTION OF HERPES ZOSTER IN IMMUNOCOMPROMISED PATIENT   ??? oxyCODONE IR (ROXICODONE) 5 mg immediate release tablet Take 5 mg by mouth every four (4) hours as needed for Pain.   ??? sertraline (ZOLOFT) 100 mg tablet TAKE 1 TAB BY MOUTH DAILY.   ??? lidocaine (LIDODERM) 5 %(700 mg/patch)    ??? BOTOX 200 unit injection       No current facility-administered medications for this encounter.      Allergies:   Allergies   Allergen Reactions   ??? Aspirin Unknown (comments)     Gi bleeding   ??? Compazine [Prochlorperazine] Rash   ??? Keflex [Cephalexin] Rash   ??? Nsaids (Non-Steroidal Anti-Inflammatory Drug) Swelling     Throat swells      Social/Work History and Prior Level of Function: [place] end, spending some time in [place].  Pt has a 28 year old son which she and her partner care for.   Living Situation: Primary residence [place].     Trainable  Caregiver?: Pt's significant other is an Conservation officer, historic buildings.  Available to assist pt as needed.    Self-care/ADLs: performing mod I, additional time, UE dressing deliberately to avoid pain L UE.     Mobility: indep transfers, bed mobility, gait in clinic.   Sleeping Arrangement:  Bed.   Adaptive Equipment Owned: na  Previous Therapy:  Pt received therapy for lymphedema management at [facility] prior to coming to this clinic. 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  Pt experienced progression of stage 0 lymphedema to stage II lymphedema L UE end of May, with increase in limb volume beginning of June 2015.  Phase I CDT initiated at that time including application of compression bandaging. Custom flat knit compression garments for daytime use and Solaris Tribute for night time use were fit upon discontinuing compression bandaging.  Currently wearing ready made daytime compression sleeve, CCL 1 with effective management of lymphedema, continuing with night time wear of Tribute.  Compression/Lymphedema Equipment:  Custom flat knit compression sleeve and custom glove, 2 sets, most recently received after appt at this clinic 02/2015.  Pt has  Solaris Tribute arm sleeve; Lymphapress Comfy sleeve pump, which she was instructed by Dr. Verl Blalock to avoid using.  Pt wearing  old CCL 1 Juzo 2001 compression sleeve to appt today.    SUBJECTIVE:   Pt arrives today with RM daytime compression sleeve donned upon arrival.  Pt continues with night time wear of Solaris Tribute per her report.  States she is supposed to resume PT in ortho setting to address shoulder dysfunction at clinic near her home on 04/01/2016.  Patient???s goals for therapy: Lymphedema management    EVALUATION AND OBJECTIVE DATA SUMMARY:   Pain: no L UE pain reported.           Skin and Tissue Assessment:  Dermal Status:  ( x)  Intact: L UE ( )  Dry   ( )  Tenuous:  ( )  Flaky   ( )  Wound/lesion present     ( )  Dermatitis    Texture/Consistency:  (x )  Boggy:  Forearm most problematic area at this time. ( )  Pitting Edema   ( )  Brawny (x )  Implants: bilateral breast   ( x)  Fibrotic/Woody:  Forearm arm.    Pigmentation/Color Change:  ( )  Normal:   ( )  Hemosiderin   ( )  Red:  ( )  Erythematous   ( )  Hyperpigmented:   ( )  Hyperlipodermatosclerosis   Anomalies:    ()  Axillary web syndrome:   ( )  Vesicles   ( )  Petechiae ( )  Warty Vercusis   ( )  Bullae ( )  Papilloma     Nails:  (x )  Normal  ( )  Fungus  Stemmers Sign: negative  Volumetric Measurements:   Right: 1532.19 mL Left:  1609.37 mL   % Difference:5.04% Dominance: Right   (See scanned graph)  Range of Motion: R shoulder flex 90, abd 90, ER 70, IR 70, with elbow, wrist, hand WFL.  L shoulder Flex 60, Abd 50, ER 45, IR 45.    Strength: Shoulder strength bilat grossly 3/5 bilateral within available ROM, limited by pain/weakness. Remaining UE strength 3+/5.  Sensation:  Pt c/o numbness upper arm, lateral upper quadrant.  Palpation:  Note significant axillary web syndrome, palpable cording noted axilla. Trigger points bilateral  pect/upper trap/mid scap.  Posture: Note forward head/shoulder posture noted.  Mobility:  Bed/Chair Mobility:  indep Transfers:  indep   Sitting Balance:  good Standing Balance:  good   Gait:  indep Wheelchair Mobility:  na    Endurance:  intact for current level of activity, including gait community distances, working abbreviated schedule. Stairs:  Not assessed.       Safety:  Patient is alert and oriented:  x4   Safety awareness:  intact   Fall Risk?:  Low, use of pain meds evening hours.      Precautions:  Standard lymphedema precautions to include avoiding blood pressure readings, injections and IVs or other procedures/acts that could lead to broken skin on affected area, and avoiding excessive heat, resistive activity or altitude without compression garment    Evaluation Time: 9:34-9:52 am 18 minutes    TREATMENT PROVIDED:   1.  Treatment description:  The patient was educated regarding the role and function of the lymphatic system, and instructed in the lymphedema management protocol of complete decongestive therapy (CDT).  This includes skin care to prevent breakdown or infection, lymphedema exercises, custom compression therapy options (bandaging, compression garments, compression pump, Lenore Cordia, [employer], [employer], etc. as needed), and decongestion with manual lymphatic drainage as indicated.  We discussed the need for consistent compression system for lymphedema management, with daytime compression garments worn and in need of replacement.  No s/s of infection noted.  Measurements completed for replacement RM compression sleeve/gauntlets secondary to limb volume discrepancy at 5%.  Pt to continue management of lymphedema as noted below.  Compression garment routine:  1.  Apply daytime compression garments to clean, dry extremity first thing in the morning, EVERY MORNING.    2. Wear garment until bedtime, adjusting throughout the day as needed should garment wrinkle or crease.  Problem areas can be at joint, and top of garment, ensuring proximal aspect of garment is positioned appropriately on extremity.  3. Launder compression garments nightly either by hand or washing machine  in a garment bag or pillowcase.  Use mild detergent with NO FABRIC SOFTENER, NO BLEACH, NO WOOLITE.  Wash in cool/warm water.    4. Dry garment in dryer on low heat right side out in garment bag or pillowcase.  5. Daytime compression garments are NOT safe to sleep in, so remove at bedtime.    6. Perform skin care to extremity, cleansing skin daily with soap and water, and using low pH lotion, upward strokes to stimulate lymphatic vessels.  Perform self MLD.  7. Apply night time compression garment as instructed to wear while sleeping, adjusting throughout the night as needed for comfort.  8. Perform exercise program with daytime compression stockings on.  Signs/Symptoms of infection include:  Fever, flu-like symptoms, pain/redness/warmth in extremity, sometimes with increase in swelling present.  Stop wearing your compression garment if this occurs.  Call your doctor immediately or go to the Emergency room to address potential infection.  Remove compression with changes in circulation or numbness in extremity, different from what you may experience when not wearing compression garment, pain, unexplained shortness of breath.      STM upper trap/mid scap m/pect bilateral.  AWS cording techniques performed L, including skin traction, lifting.  Treatment time:  9:54-10:51 am   57 minutes     Manual therapy 4         ASSESSMENT:   Emily [name] is a 65 y.o. female who presents with stage 2 lymphedema L UE,  with forearm boggy, however, patient managing with wear of RM compression sleeve during the day, and Solaris Tribute wear at night.  Pt did have cellulitis infection L UE, [facility] from 09/15/2015-09/19/2015, receiving IV antibiotics, followed by oral antibiotics.  Note overall improvement in limb volume measurements, with limb volume discrepancy at just over 5%.  Decision made for patient to continue with wear of RM daytime compression sleeve/gauntlet, Tribute  at night.  Current RM daytime compression garments are worn and in need of replacement, with order placed for 2 new sets.  Pt has remained compliant with lymphedema management.  Pt continues with bilat shoulder dysfunction, orthopedic in nature, which was present prior to breast cancer diagnosis and treatment.   Home management of lymphedema concerning compression garment wear to be continued at this time as noted above, with pt to fit new garments upon receiving.    This care is medically necessary due to the infection risk with lymphedema, and to improve functional activities.  CDT is necessary to resolve swelling to allow patient to return to wearing normal clothes/footwear, and prevent worsening of symptoms, such as venous stasis ulcerations, infections, or hospitalizations.  Patient will be independent with home program strategies to allow improved ADL ability and mobility and to allow patient to return to greatest functional independence.    Pt to call clinic with any questions or concerns regarding garment fit.  Pt to return for follow up visit in 6 months.  Discharge from PT regarding lymphedema management at this time.    PLAN OF CARE:       Patient has participated in goal setting and agrees to work toward plan of care.  Patient was instructed to call if questions or concerns arise.    Thank you for this referral.  Kalman Shan, PT, CLT    Time Calculation: 77 mins    TREATMENT PLAN EFFECTIVE DATES:   03/29/2016  I have read the above plan of care for [facility].  I certify the above prescribed services are required by this patient and are medically necessary.  The above plan of care has been developed in conjunction with the lymphedema/physical therapist.       Physician Signature: ____________________________________Date:______________

## 2016-04-14 ENCOUNTER — Inpatient Hospital Stay: Admit: 2016-04-14 | Payer: BLUE CROSS/BLUE SHIELD | Attending: Specialist | Primary: Family Medicine

## 2016-04-14 DIAGNOSIS — R062 Wheezing: Secondary | ICD-10-CM

## 2016-04-14 NOTE — Progress Notes (Signed)
Please let her know this is normal . Thanks!

## 2016-04-15 NOTE — Telephone Encounter (Signed)
[facility] at [phone] and inquired where this office could contact Dr. Regis Bill who read the patient's x-ray and was informed that he is at [facility] and the number to the reading room is [phone] and the front desk number is [phone].    04/15/16 at 9:24 am - Called the [facility] reading room and spoke to Dr. Regis Bill and verified the patient's ID x 2.  Informed Dr. Kennon Rounds that this office spoke with the patient who stated she has never had a cholecystectomy and at the end of his report it states that she is status post a cholecystectomy.  Dr. Kennon Rounds stated that he sees clips in her upper right quadrant which usually indicates a cholecsytectomy but in her case it is due to her right adrenal surgery and that he will addend the report to reflect this.

## 2016-04-15 NOTE — Telephone Encounter (Signed)
Called the patient and verified ID x 2.  Informed the patient that this office spoke with the radiologist and that he saw right clips in the upper quadrant which usually indicates a cholecystectomy but after reviewing her history the clips are due to the right adrenal surgery she had and that an updated report was faxed to Dr. Bjorn Loser and that a fax confirmation was received.  The patient verbalized understanding and denied any further questions or concerns.

## 2016-04-15 NOTE — Telephone Encounter (Signed)
Received a call from the patient and verified ID x 2.  The patient stated that she had her chest x-ray completed yesterday 04/14/16 because she has been having upper respiratory issues since 03/10/16 and has been to her PCP and ENT and wanted to make sure she does not have pneumonia since she will be flying to [place] today 04/15/16 and was reading the report and saw that she is status post a cholecystectomy and the patient stated that she has not had her gall bladder removed.  Informed the patient that this office will research this issue and call the patient back with an update.  The patient verbalized understanding and requested that if the report is changed to fax those results to Dr. Nickola Major at [phone] and to call and leave a message on her phone if she does not pick up.  Informed her that this office will fax a report if it is changed and leave a message with an update.  The patient verbalized understanding and denied any further questions or concerns.

## 2016-05-25 NOTE — Telephone Encounter (Signed)
Patient called and stated that she would like a call back to discuss her bone pain and to see if she can have her Dexa bone scan moved up. CB# [phone]

## 2016-05-25 NOTE — Telephone Encounter (Signed)
See the telephone encounter by Curly Rim, RN on 05/25/16 at 18:36 pm.  The patient will call the [facility] to reschedule her dexa scan for 09/30/16 which would be two years after her last dexa scan on 09/29/14 which was verified by phone with [facility] on 05/25/16.

## 2016-05-25 NOTE — Telephone Encounter (Signed)
Called the patient and verified ID x 2.  Inquired about the patient's bone pain.  The patient stated that her right elbow has been an "issue" for a year now and has had three shots of prednisone and her othopedic doctor is unable to recommend "anything else" and then two weeks ago she started having pain in her right hip and that she limps when she walks.  The patient also stated that she has tried pain creams, patches, Tylenol, and Oxycodone for the pain without relief and that she has pain in her knees and shoulders but injections and heat have alleviated the pain in her knees and shoulders.  Informed the patient that per Abel Presto, NP because the pain is in two localized areas that she will order a bone scan.  Also, informed the patient that her last dexa scan was on 09/29/14.  The patient verbalized understanding and stated that she has had a nuclear medicine test back in 1999 for her thyroid and wanted to make sure [name] and Sherrine Maples are aware of that in case there are any contraindications with the bine scan and that she will call [facility] to reschedule her dexa scan for 09/30/16.  Informed the patient that they will be made aware of this.  The patient denied any further questions or concerns.

## 2016-05-27 ENCOUNTER — Encounter

## 2016-05-30 ENCOUNTER — Encounter

## 2016-05-30 MED ORDER — FARESTON 60 MG TABLET
60 mg | ORAL_TABLET | ORAL | 5 refills | Status: DC
Start: 2016-05-30 — End: 2016-11-03

## 2016-06-09 ENCOUNTER — Inpatient Hospital Stay: Admit: 2016-06-09 | Payer: BLUE CROSS/BLUE SHIELD | Attending: Nurse Practitioner | Primary: Family Medicine

## 2016-06-09 DIAGNOSIS — C50312 Malignant neoplasm of lower-inner quadrant of left female breast: Secondary | ICD-10-CM

## 2016-06-09 MED ORDER — TECHNETIUM TC 99M MEDRONATE IV KIT
Freq: Once | Status: AC
Start: 2016-06-09 — End: 2016-06-09
  Administered 2016-06-09: 14:00:00 via INTRAVENOUS

## 2016-06-09 NOTE — Telephone Encounter (Signed)
Patient called and requested that we fax her radiology report, when we receive it, to both Dr. Olen Pel as well as Dr. Learta Codding. Patient also asked that we call her with results, when we receive them. CB# [phone]    Dr. Olen Pel fax# [phone]  Dr. Learta Codding Fax# [phone]

## 2016-06-09 NOTE — Telephone Encounter (Signed)
Marjory Sneddon, NP, discussed bone scan results with patient.  Bone scan results faxed to Dr. Olen Pel and Dr. Kalman Shan as requested by patient.  Fax confirmation received.

## 2016-06-09 NOTE — Progress Notes (Signed)
Discussed results with patient.

## 2016-06-14 ENCOUNTER — Inpatient Hospital Stay: Admit: 2016-06-14 | Payer: BLUE CROSS/BLUE SHIELD | Primary: Family Medicine

## 2016-06-14 DIAGNOSIS — I890 Lymphedema, not elsewhere classified: Secondary | ICD-10-CM

## 2016-06-14 NOTE — Progress Notes (Signed)
[address]  [address]  [place]  [address]      EVALUATION    NAME: Emily [name] AGE: 65 y.o.  GENDER: female  DATE: 06/14/2016  REFERRING PHYSICIAN:  Michaele Offer, MD  HISTORY AND BACKGROUND:  Pt is a 65 y/o female returns for evaluation of L UE lymphedema.  Pt states she is continuing with wear of day/night time compression garment, daytime garments in good condition, however, returns today for evaluation of night time compression garment, stating it is too large and she believes it needs to be replaced.  Pt c/o fit at thumb area, stating garment is uncomfortable.      Breast cancer history at time of initial evaluation as follows:    Pt found to have 2.6 cm LN L axilla 09/24/2012.  Underwent biopsy on 10/02/2012 with findings of metastatic neoplasm.  MRI on 10/05/2012 revealed L breast mass in addition to lymph node.  Biopsy of breast mass on 10/16/2012 showed poorly differentiated carcinoma.  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  Pt completed chemotherapy and XRT.  Pt received care to address L UE lymphedema at [facility], prior to coming to this clinic. Pt presented at initial eval with pain L axilla, reporting "fluid pocket" in region, severe pain with shoulder movement, and feeling of "fullness" L upper arm. Pt stated symptoms presented post surgery.  Pt reports bilat shoulder dysfunction prior to cancer diagnosis, having undergone R rotator cuff repair 7 years ago, L rotator cuff repair 3 years ago. Pt is s/p bilateral breast reconstruction 09/16/2013.  States additional reconstructive surgery scheduled for 01/30/2014.  Pt underwent multiple surgeries for  reconstruction bilateral breasts, reduction with implant placement R breast, small implant placement with fat grafting L breast.  Pt states continued concerns regarding healing L implant was tenuous, and she has refrained from use of vasopneumatic pump to address lymphedema per Dr. Benjaman Lobe order.  Pt reports she has been compliant with day/night time compression garment wear L UE, as instructed.    Primary Diagnosis:  ?? 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 Malignant neoplasm of breast (C50.2)  Date of Onset: 11/29/2011  Present Symptoms and Functional Limitations: Pain L axilla/upper arm/upper quadrant anterior/lateral aspect.  L UE lymphedema.  LLIS lymphedema outcomes measure:  35/72; 51% impairment.  Past Medical History:   Past Medical History:   Diagnosis Date   ??? Anxiety    ??? Arthritis    ??? Cancer ([place])     left breast  cancer   ??? Chronic tension headaches    ??? Concussion 2011   ??? Headache     OCCAS   ??? Ill-defined condition     cellulitis L UE   ??? Lymphedema of arm     left   ??? Nausea & vomiting    ??? Other and unspecified symptoms and signs involving general sensations and perceptions     HIVES RECENTLY- WAS IN ER Feb 19, 2013   ??? Other ill-defined conditions     PHEOCHROMOCYTOMA     Past Surgical History:   Procedure Laterality Date   ??? ABDOMEN SURGERY PROC UNLISTED      hernia mesh repair   ??? ABDOMEN  SURGERY PROC UNLISTED      right adenal removed   ??? BREAST SURGERY PROCEDURE UNLISTED  '91    breast implants; replaced bilat implants 2011   ??? HX APPENDECTOMY     ??? HX BREAST AUGMENTATION Left 04/22/2014    REMOVAL OF LEFT BREAST IMPLANT, WASHOUT AND CLOSURE OF LEFT BREAST WOUND performed by Sharlotte Alamo, MD at [facility]   ??? HX BREAST RECONSTRUCTION  11/28/2012    BREAST RECONSTRUCTION /C  INSERTION EXPANDER & ALLODERM performed by Sharlotte Alamo, MD at [facility]   ??? HX BREAST RECONSTRUCTION Bilateral 09/16/2013     REMOVAL AND REPLACEMENT LEFT BREAST IMPLANT TISSUE EXPANDERS WITH SUBTOTAL CAPSULECTOMY, RIGHT BREAST IMPLANT REMOVAL AND REPLACEMENT FOR SYMMETRY/FAT GRAFTING TO BILATERAL BREASTS performed by Sharlotte Alamo, MD at [facility]   ??? HX GYN      c-section   ??? HX HEENT      T&A   ??? HX HEENT      right adrenal removed   ??? HX MASTECTOMY Left    ??? HX MODIFIED RADICAL MASTECTOMY  11/28/2012    LEFT BREAST MODIFIED RADICAL MASTECTOMY WITH AXILLARY DISSECTION W/ RECONSTRUCTION LEFT BREAST WITH TISSUE EXPANDER AND ALLODERM;PORTACATH INSERTION performed by Ty Hilts, MD at [facility]   ??? HX OOPHORECTOMY      right   ??? HX ORTHOPAEDIC      bilateral shoulder arthroscopy   ??? HX VASCULAR ACCESS      PORT, THEN REMOVED     Current Medications:    Current Outpatient Prescriptions   Medication Sig   ??? FARESTON 60 mg tab TAKE 60 MG BY MOUTH DAILY.   ??? zolpidem (AMBIEN) 10 mg tablet TAKE 1 TABLET BY MOUTH AT BEDTIME   ??? ondansetron (ZOFRAN ODT) 4 mg disintegrating tablet Take 1 Tab by mouth every eight (8) hours as needed for Nausea.   ??? diazePAM (VALIUM) 5 mg tablet Take 1 Tab by mouth every six (6) hours as needed for Anxiety. Max Daily Amount: 20 mg. Indications: MUSCLE SPASM   ??? butalbital-acetaminophen-caff (FIORICET) 50-300-40 mg per capsule as needed.   ??? codeine-butalbital-acetaminophen-caffeine (FIORICET WITH CODEINE) 50-325-40-30 mg per capsule    ??? valACYclovir (VALTREX) 1 gram tablet Take 2 tabs by mouth twice a day   Indications: PREVENTION OF HERPES ZOSTER IN IMMUNOCOMPROMISED PATIENT   ??? oxyCODONE IR (ROXICODONE) 5 mg immediate release tablet Take 5 mg by mouth every four (4) hours as needed for Pain.   ??? sertraline (ZOLOFT) 100 mg tablet TAKE 1 TAB BY MOUTH DAILY.   ??? lidocaine (LIDODERM) 5 %(700 mg/patch)    ??? BOTOX 200 unit injection      No current facility-administered medications for this encounter.      Allergies:   Allergies   Allergen Reactions   ??? Aspirin Unknown (comments)     Gi bleeding    ??? Compazine [Prochlorperazine] Rash   ??? Keflex [Cephalexin] Rash   ??? Nsaids (Non-Steroidal Anti-Inflammatory Drug) Swelling     Throat swells      Social/Work History and Prior Level of Function: [place], spending some time in [place].  Pt has a 30 year old son which she and her partner care for.   Living Situation: Primary residence [place].     Trainable Caregiver?: Pt's significant other is an Conservation officer, historic buildings.  Available to assist pt as needed.    Self-care/ADLs: performing mod  I, additional time, UE dressing deliberately to avoid pain L UE.     Mobility: indep transfers, bed mobility, gait in clinic.   Sleeping Arrangement:  Bed.   Adaptive Equipment Owned: na  Previous Therapy:  Pt received therapy for lymphedema management at [facility] prior to coming to this clinic. 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  Pt experienced progression of stage 0 lymphedema to stage II lymphedema L UE end of May, with increase in limb volume beginning of June 2015.  Phase I CDT initiated at that time including application of compression bandaging. Custom flat knit compression garments for daytime use and Solaris Tribute for night time use were fit upon discontinuing compression bandaging.  Currently wearing ready made daytime compression sleeve, CCL 1 with effective management of lymphedema, continuing with night time wear of Tribute--pt reporting poor fit and in need of replacement.  Compression/Lymphedema Equipment:  Custom flat knit compression sleeve and custom glove, 2 sets, most recently received after appt at this clinic 02/2015.  Pt has  Solaris Tribute arm sleeve; Lymphapress Comfy sleeve pump, which she was instructed by Dr. Verl Blalock to avoid using.  Pt wearing custom flat knit 3021 compression sleeve to appt today.    SUBJECTIVE:   Pt arrives today with custom daytime compression sleeve donned upon  arrival.  Pt continues with night time wear of Solaris Tribute per her report, stating it is causing irritation at thumb, and no longer effective, secondary to being too large.  States she has resumed PT in ortho setting to address shoulder dysfunction at clinic near her home on 04/01/2016.  Patient???s goals for therapy: Lymphedema management    EVALUATION AND OBJECTIVE DATA SUMMARY:   Pain: no L UE pain reported.           Skin and Tissue Assessment:  Dermal Status:  ( x)  Intact: L UE ( )  Dry   ( )  Tenuous:  ( )  Flaky   ( )  Wound/lesion present     ( )  Dermatitis    Texture/Consistency:  (x )  Boggy:  Forearm most problematic area at this time. ( )  Pitting Edema   ( )  Brawny (x )  Implants: bilateral breast   ( x)  Fibrotic/Woody:  Forearm arm.    Pigmentation/Color Change:  ( )  Normal:   ( )  Hemosiderin   ( )  Red:  ( )  Erythematous   ( )  Hyperpigmented:   ( )  Hyperlipodermatosclerosis   Anomalies:    ()  Axillary web syndrome:   ( )  Vesicles   ( )  Petechiae ( )  Warty Vercusis   ( )  Bullae ( )  Papilloma     Nails:  (x )  Normal  ( )  Fungus  Stemmers Sign: negative  Volumetric Measurements:   Right: 15318.36 mL Left:  1552.57 mL   % Difference:5.04% Dominance: Right   (See scanned graph)  Posture:  forward head/shoulder posture noted.  Mobility:  Bed/Chair Mobility:  indep Transfers:  indep   Sitting Balance:  good Standing Balance:  good   Gait:  indep Wheelchair Mobility:  na   Endurance:  intact for current level of activity, including gait community distances, working abbreviated schedule. Stairs:  Not assessed.       Safety:  Patient is alert and oriented:  x4   Safety  awareness:  intact   Fall Risk?:  Low, use of pain meds evening hours.      Precautions:  Standard lymphedema precautions to include avoiding blood pressure readings, injections and IVs or other procedures/acts that could lead to broken skin on affected area, and avoiding excessive heat,  resistive activity or altitude without compression garment    Evaluation Time: 9:55-10:12 am 17 minutes    TREATMENT PROVIDED:   1.  Treatment description:  The patient was educated regarding the role and function of the lymphatic system, and instructed in the lymphedema management protocol of complete decongestive therapy (CDT).  This includes skin care to prevent breakdown or infection, lymphedema exercises, custom compression therapy options (bandaging, compression garments, compression pump, Lenore Cordia, [employer], [employer], etc. as needed), and decongestion with manual lymphatic drainage as indicated.  We discussed the need for consistent compression system for lymphedema management, with Solaris tribute night time garment brought by pt to clinic for assessment secondary to poor fit and concerns regarding irritation at thumb and between fingers with wear.  No s/s of infection noted.  Solaris tribute donned in clinic with outer jacket, with pt wearing 10 minutes to allow therapist to assess compression markings.  Note limited markings upper arm/forearm secondary to garment size and garment being 65 years old.  Measurements completed for replacement Solaris Tribute arm sleeve with finger spacers at hand, requesting longer length of thumb stub and addition of velcro per patient request.  See scanned measurements.  Pt advised to return to clinic in 1 months, bringing replacement night time sleeve for fit, with need to alter to be addressed at that time.  Limb volume discrepancy at 2.25%, which is improved since most recent visit.  Pt to continue management of lymphedema as noted below.  Compression garment routine:  1.  Apply daytime compression garments to clean, dry extremity first thing in the morning, EVERY MORNING.    2. Wear garment until bedtime, adjusting throughout the day as needed should garment wrinkle or crease.  Problem areas can be at joint, and top  of garment, ensuring proximal aspect of garment is positioned appropriately on extremity.  3. Launder compression garments nightly either by hand or washing machine in a garment bag or pillowcase.  Use mild detergent with NO FABRIC SOFTENER, NO BLEACH, NO WOOLITE.  Wash in cool/warm water.    4. Dry garment in dryer on low heat right side out in garment bag or pillowcase.  5. Daytime compression garments are NOT safe to sleep in, so remove at bedtime.    6. Perform skin care to extremity, cleansing skin daily with soap and water, and using low pH lotion, upward strokes to stimulate lymphatic vessels.  Perform self MLD.  7. Apply night time compression garment as instructed to wear while sleeping, adjusting throughout the night as needed for comfort.  8. Perform exercise program with daytime compression stockings on.  Signs/Symptoms of infection include:  Fever, flu-like symptoms, pain/redness/warmth in extremity, sometimes with increase in swelling present.  Stop wearing your compression garment if this occurs.  Call your doctor immediately or go to the Emergency room to address potential infection.  Remove compression with changes in circulation or numbness in extremity, different from what you may experience when not wearing compression garment, pain, unexplained shortness of breath.      Treatment time:  10:20-11:06 am   46 minutes     Manual therapy 3         ASSESSMENT:  Emily [name] is a 65 y.o. female who presents with stage 2 lymphedema L UE, with forearm mildly boggy, however, patient managing with wear of RM compression sleeve during the day, and Solaris Tribute wear at night.  Limb volume discrepancy 2.25%, improved from 5% when last seen 03/29/2016.  Pt receiving PT in outpatient ortho setting, therefore, ROM/Strength not assessed.  Pt did have cellulitis infection L UE, hospitalized at [facility] from 09/15/2015-09/19/2015, receiving IV antibiotics,  followed by oral antibiotics.  Decision made for patient to continue with wear of RM/flat knit daytime compression sleeve/gauntlet, Tribute at night.  Solaris Tribute worn, with poor fit, and in need of replacement, with garment order placed.  Pt has remained compliant with lymphedema management.  Pt continues with bilat shoulder dysfunction, orthopedic in nature, which was present prior to breast cancer diagnosis and treatment, with pt seeing PT at ortho clinic.   Home management of lymphedema concerning compression garment wear to be continued at this time as noted above, with pt to fit new Solaris Tribute night time garment in clinic upon receiving, with noted adjustments to garment above, measurements scanned into chart.    This care is medically necessary due to the infection risk with lymphedema, and to improve functional activities.  CDT is necessary to resolve swelling to allow patient to return to wearing normal clothes/footwear, and prevent worsening of symptoms, such as venous stasis ulcerations, infections, or hospitalizations.  Patient will be independent with home program strategies to allow improved ADL ability and mobility and to allow patient to return to greatest functional independence.    Pt to call clinic with any questions or concerns regarding garment fit.  Pt will benefit from 2-5 prn PT visits over the next 6-12 weeks to establish effective and comfortable fit of night time compression garment, and to order daytime compression garments as needed based on age and wear of current garment.     PLAN OF CARE:  [employer] upon receiving.  Request any needed alteration.  Order replacement daytime garments as indicated.   Goals:  1.  Establish effective and comfortable fit of replacement Solaris Tribute compression garment, with pt able to don/doff indep.    2.  Order any needed replacement daytime compression garments.     Patient has participated in goal setting and agrees to work toward plan of care.  Patient was instructed to call if questions or concerns arise.    Thank you for this referral.  Kalman Shan, PT, CLT    Time Calculation: 71 mins    TREATMENT PLAN EFFECTIVE DATES:   06/14/2016 to 09/06/2016  I have read the above plan of care for [facility].  I certify the above prescribed services are required by this patient and are medically necessary.  The above plan of care has been developed in conjunction with the lymphedema/physical therapist.       Physician Signature: ____________________________________Date:______________

## 2016-07-25 NOTE — Telephone Encounter (Signed)
Pt called stating she would like to know if it is okay for her to come in early because she has met her OOP with her insurance and she would like know if she can get her MRI early as well. Call back number [phone]

## 2016-07-25 NOTE — Telephone Encounter (Signed)
Spoke to patient, who states that she originally didn't have a follow-up appointment with Dr. Laurena [name] until January, but has already moved it up to 08/10/16, and states she will get Dr. Laurena [name] to order her MRI breast and any other testing she will need at that office visit.  Patient states she is aware that insurance will not pay for her MRI breast unless it has been a year since her last MRI.  Patient denies any further questions at this time.

## 2016-07-27 DIAGNOSIS — I890 Lymphedema, not elsewhere classified: Secondary | ICD-10-CM

## 2016-07-27 NOTE — Progress Notes (Signed)
[address]  [address]  [place]  [address]      Lymphedema Therapy      07/27/2016:  Emily [name] was not seen on this date for physical therapy for the following reasons:             Patient called to cancel the visit for the following reasons: illness           Patient missed the visit and did not call to cancel.  Kalman Shan, PT, CLT

## 2016-07-28 ENCOUNTER — Inpatient Hospital Stay: Payer: BLUE CROSS/BLUE SHIELD | Primary: Family Medicine

## 2016-08-03 ENCOUNTER — Ambulatory Visit
Admit: 2016-08-03 | Discharge: 2016-08-03 | Payer: PRIVATE HEALTH INSURANCE | Attending: Surgery | Primary: Family Medicine

## 2016-08-03 DIAGNOSIS — N6310 Unspecified lump in the right breast, unspecified quadrant: Secondary | ICD-10-CM

## 2016-08-03 NOTE — Progress Notes (Signed)
Formatting of this note is different from the original.  Images from the original note were not included.  HISTORY OF PRESENT ILLNESS  Emily [name] is a 65 y.o. female.  HPI  ESTABLISHED patient here for new lump to her outer RIGHT breast.  Martin Majestic to for her well check with Dr. Melina Modena this morning and Dr. Melina Modena felt a lump to the outer RIGHT breast.  Denies breast pain but does have shoulder pain.  No skin changes.      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 1/14 nodes positive with a 1 cm met, no ECE. 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    12/25/2012 - 02/26/2013: S/p TC q 3 weeks x 4 (75 mg/m2; 600 mg/m2)    04/02/2013 - 05/10/2013: S/p XRT    05/17/2013 - 07/30/2013: Anastrozole, stopped due to joint pains  08/06/2013 - 09/30/2013: Exemestane stopped due joint pains  07/10/2013: Zometa q 6 months x 3 years, stopped   09/30/2013 - 03/08/2014: Tamoxifen, stopped 03/08/14 due to joint pains  03/23/2014 - 09/18/2014: Letrozole, stopped due to joint pains  10/04/2014 - 12/15/2014: Tamoxifen, stopped due to hot flashes and mood swings.  12/16/2014 - 01/30/2015: Anastrozole, stopped due to headache and dizziness  02/09/2015 started Toremefine    Last breast MRI done, 01/01/16, BIRADS 1    Past Medical History:   Diagnosis Date   ? Anxiety    ? Arthritis    ? Cancer [facility])     left breast  cancer   ? Chronic tension headaches    ? Concussion 2011   ? Headache(784.0)     OCCAS   ? Ill-defined condition     cellulitis L UE   ? Lymphedema of arm     left   ? Nausea & vomiting    ? Other and unspecified symptoms and signs involving general sensations and perceptions     HIVES RECENTLY- WAS IN ER Feb 19, 2013   ? Other ill-defined conditions(799.89)     PHEOCHROMOCYTOMA     Past Surgical History:   Procedure Laterality Date   ? ABDOMEN SURGERY PROC UNLISTED      hernia mesh repair   ? ABDOMEN SURGERY PROC UNLISTED      right adenal removed   ? BREAST SURGERY  PROCEDURE UNLISTED  '91    breast implants; replaced bilat implants 2011   ? HX APPENDECTOMY     ? HX BREAST AUGMENTATION Left 04/22/2014    REMOVAL OF LEFT BREAST IMPLANT, WASHOUT AND CLOSURE OF LEFT BREAST WOUND performed by Sharlotte Alamo, MD at [place]   ? HX BREAST RECONSTRUCTION  11/28/2012    BREAST RECONSTRUCTION /C  INSERTION EXPANDER & ALLODERM performed by Sharlotte Alamo, MD at [place]   ? HX BREAST RECONSTRUCTION Bilateral 09/16/2013    REMOVAL AND REPLACEMENT LEFT BREAST IMPLANT TISSUE EXPANDERS WITH SUBTOTAL CAPSULECTOMY, RIGHT BREAST IMPLANT REMOVAL AND REPLACEMENT FOR SYMMETRY/FAT GRAFTING TO BILATERAL BREASTS performed by Sharlotte Alamo, MD at [place]   ? HX GYN      c-section   ? HX HEENT      T&A   ? HX HEENT      right adrenal removed   ? HX MASTECTOMY Left    ? HX MODIFIED RADICAL MASTECTOMY  11/28/2012    LEFT BREAST MODIFIED RADICAL MASTECTOMY WITH AXILLARY DISSECTION W/ RECONSTRUCTION LEFT BREAST WITH TISSUE EXPANDER AND ALLODERM;PORTACATH  INSERTION performed by Ty Hilts, MD at [place]   ? HX OOPHORECTOMY      right   ? HX ORTHOPAEDIC      bilateral shoulder arthroscopy   ? HX VASCULAR ACCESS      PORT, THEN REMOVED     Social History     Social History   ? Marital status: SINGLE     Spouse name: N/A   ? Number of children: N/A   ? Years of education: N/A     Occupational History   ? Not on file.     Social History Main Topics   ? Smoking status: Never Smoker   ? Smokeless tobacco: Never Used   ? Alcohol use No   ? Drug use: No   ? Sexual activity: Not on file     Other Topics Concern   ? Not on file     Social History Narrative     Current Outpatient Prescriptions on File Prior to Visit   Medication Sig Dispense Refill   ? FARESTON 60 mg tab TAKE 60 MG BY MOUTH DAILY. 30 Tab 5   ? diazePAM (VALIUM) 5 mg tablet Take 1 Tab by mouth every six (6) hours as needed for Anxiety. Max Daily Amount: 20 mg. Indications: MUSCLE SPASM 30 Tab 0   ? oxyCODONE IR  (ROXICODONE) 5 mg immediate release tablet Take 5 mg by mouth every four (4) hours as needed for Pain.     ? sertraline (ZOLOFT) 100 mg tablet TAKE 1 TAB BY MOUTH DAILY. 30 Tab 3   ? zolpidem (AMBIEN) 10 mg tablet TAKE 1 TABLET BY MOUTH AT BEDTIME  3   ? ondansetron (ZOFRAN ODT) 4 mg disintegrating tablet Take 1 Tab by mouth every eight (8) hours as needed for Nausea. 12 Tab 1   ? butalbital-acetaminophen-caff (FIORICET) 50-300-40 mg per capsule as needed.     ? codeine-butalbital-acetaminophen-caffeine (FIORICET WITH CODEINE) 50-325-40-30 mg per capsule      ? valACYclovir (VALTREX) 1 gram tablet Take 2 tabs by mouth twice a day   Indications: PREVENTION OF HERPES ZOSTER IN IMMUNOCOMPROMISED PATIENT 120 Tab 5   ? lidocaine (LIDODERM) 5 %(700 mg/patch)   2   ? BOTOX 200 unit injection        No current facility-administered medications on file prior to visit.      Allergies   Allergen Reactions   ? Aspirin Unknown (comments)     Gi bleeding   ? Compazine [Prochlorperazine] Rash   ? Keflex [Cephalexin] Rash   ? Nsaids (Non-Steroidal Anti-Inflammatory Drug) Swelling     Throat swells     OB History     Obstetric Comments    Menarche:  16.   LMP: 24.  # of Children:  2.  Age at Delivery of First Child:  39.   Hysterectomy/oophorectomy:  NO/NO.  Breast Bx:  no.  Hx of Breast Feeding:  yes.  BCP:  no. Hormone therapy:  Yes '98 to 12/13.       ROS  Constitutional: Negative    HENT: Negative.   Eyes: Negative.    Respiratory: Negative.   Cardiovascular: Negative.   Gastrointestinal: Negative.   Genitourinary: Negative.   Musculoskeletal: Negative.   Skin: Negative.   Neurological: Negative.   Endo/Heme/Allergies: Negative.   Psychiatric/Behavioral: Negative.    Physical Exam   Cardiovascular: Normal rate and normal heart sounds.    Pulmonary/Chest: Breath sounds normal. Right breast exhibits mass.  Right breast exhibits no inverted nipple, no nipple discharge, no skin change and no tenderness. Left breast exhibits no  inverted nipple, no mass, no nipple discharge, no skin change and no tenderness. Breasts are symmetrical.     Lymphadenopathy:        Right cervical: No superficial cervical, no deep cervical and no posterior cervical adenopathy present.       Left cervical: No superficial cervical, no deep cervical and no posterior cervical adenopathy present.        Right axillary: No pectoral and no lateral adenopathy present.        Left axillary: No pectoral and no lateral adenopathy present.    BREAST ULTRASOUND  Indication: Right  breast mass UOQ  Technique:  The area was scanned using a high-frequency linear-array near-field transducer  Findings: No abnormal mass, lesion, or shadowing noted.  No cysts  Impression: Edge of pectoral muscle with scar tissue on edge of implant  Disposition: No worrisome finding on ultrasound    ASSESSMENT and PLAN    ICD-10-CM ICD-9-CM    1. Breast mass, right N63.10 611.72    2. Malignant neoplasm of lower-inner quadrant of left breast in female, estrogen receptor positive ([place]) C50.312 174.3     Z17.0 V86.0      Pt presents with RIGHT breast mass, likely related to the edge of pectoral muscle and rib. Last MRI looks good. Normal [place] today. F/u in 1 year. This plan was reviewed with the patient and patient agrees. All questions were answered.    Written by Carilyn Goodpasture, ScribeKick, as dictated by Dr. Sandford Craze, MD.     Electronically signed by Ty Hilts., MD at 08/03/2016  2:33 PM EDT

## 2016-08-03 NOTE — Progress Notes (Signed)
HISTORY OF PRESENT ILLNESS  Emily [name] is a 65 y.o. female.  HPI  ESTABLISHED patient here for new lump to her outer RIGHT breast.  Martin Majestic to for her well check with Dr. Melina Modena this morning and Dr. Melina Modena felt a lump to the outer RIGHT breast.  Denies breast pain but does have shoulder pain.  No skin changes.      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 1/14 nodes positive with a 1 cm met, no ECE. 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  ????  12/25/2012 - 02/26/2013: S/p TC q 3 weeks x 4 (75 mg/m2; 600 mg/m2)  ????  04/02/2013 - 05/10/2013: S/p XRT  ????  05/17/2013 - 07/30/2013: Anastrozole, stopped due to joint pains??  08/06/2013 - 09/30/2013: Exemestane stopped due joint pains  07/10/2013: Zometa q 6 months x 3 years, stopped   09/30/2013 - 03/08/2014: Tamoxifen, stopped 03/08/14 due to joint pains  03/23/2014 - 09/18/2014: Letrozole, stopped due to joint pains  10/04/2014 - 12/15/2014: Tamoxifen, stopped due to hot flashes and mood swings.  12/16/2014 - 01/30/2015: Anastrozole, stopped due to headache and dizziness  02/09/2015 started Toremefine    Last breast MRI done, 01/01/16, BIRADS 1    Past Medical History:   Diagnosis Date   ??? Anxiety    ??? Arthritis    ??? Cancer ([place])     left breast  cancer   ??? Chronic tension headaches    ??? Concussion 2011   ??? Headache(784.0)     OCCAS   ??? Ill-defined condition     cellulitis L UE   ??? Lymphedema of arm     left   ??? Nausea & vomiting    ??? Other and unspecified symptoms and signs involving general sensations and perceptions     HIVES RECENTLY- WAS IN ER Feb 19, 2013   ??? Other ill-defined conditions(799.89)     PHEOCHROMOCYTOMA       Past Surgical History:   Procedure Laterality Date   ??? ABDOMEN SURGERY PROC UNLISTED      hernia mesh repair   ??? ABDOMEN SURGERY PROC UNLISTED      right adenal removed   ??? BREAST SURGERY PROCEDURE UNLISTED  '91    breast implants; replaced bilat implants 2011   ??? HX APPENDECTOMY      ??? HX BREAST AUGMENTATION Left 04/22/2014    REMOVAL OF LEFT BREAST IMPLANT, WASHOUT AND CLOSURE OF LEFT BREAST WOUND performed by Sharlotte Alamo, MD at [place]   ??? HX BREAST RECONSTRUCTION  11/28/2012    BREAST RECONSTRUCTION /C  INSERTION EXPANDER & ALLODERM performed by Sharlotte Alamo, MD at [place]   ??? HX BREAST RECONSTRUCTION Bilateral 09/16/2013    REMOVAL AND REPLACEMENT LEFT BREAST IMPLANT TISSUE EXPANDERS WITH SUBTOTAL CAPSULECTOMY, RIGHT BREAST IMPLANT REMOVAL AND REPLACEMENT FOR SYMMETRY/FAT GRAFTING TO BILATERAL BREASTS performed by Sharlotte Alamo, MD at [place]   ??? HX GYN      c-section   ??? HX HEENT      T&A   ??? HX HEENT      right adrenal removed   ??? HX MASTECTOMY Left    ??? HX MODIFIED RADICAL MASTECTOMY  11/28/2012    LEFT BREAST MODIFIED RADICAL MASTECTOMY WITH AXILLARY DISSECTION W/ RECONSTRUCTION LEFT BREAST WITH TISSUE EXPANDER AND ALLODERM;PORTACATH INSERTION performed by Ty Hilts, MD at [place]   ??? HX OOPHORECTOMY  right   ??? HX ORTHOPAEDIC      bilateral shoulder arthroscopy   ??? HX VASCULAR ACCESS      PORT, THEN REMOVED       Social History     Social History   ??? Marital status: SINGLE     Spouse name: N/A   ??? Number of children: N/A   ??? Years of education: N/A     Occupational History   ??? Not on file.     Social History Main Topics   ??? Smoking status: Never Smoker   ??? Smokeless tobacco: Never Used   ??? Alcohol use No   ??? Drug use: No   ??? Sexual activity: Not on file     Other Topics Concern   ??? Not on file     Social History Narrative       Current Outpatient Prescriptions on File Prior to Visit   Medication Sig Dispense Refill   ??? FARESTON 60 mg tab TAKE 60 MG BY MOUTH DAILY. 30 Tab 5   ??? diazePAM (VALIUM) 5 mg tablet Take 1 Tab by mouth every six (6) hours as needed for Anxiety. Max Daily Amount: 20 mg. Indications: MUSCLE SPASM 30 Tab 0   ??? oxyCODONE IR (ROXICODONE) 5 mg immediate release tablet Take 5 mg by  mouth every four (4) hours as needed for Pain.     ??? sertraline (ZOLOFT) 100 mg tablet TAKE 1 TAB BY MOUTH DAILY. 30 Tab 3   ??? zolpidem (AMBIEN) 10 mg tablet TAKE 1 TABLET BY MOUTH AT BEDTIME  3   ??? ondansetron (ZOFRAN ODT) 4 mg disintegrating tablet Take 1 Tab by mouth every eight (8) hours as needed for Nausea. 12 Tab 1   ??? butalbital-acetaminophen-caff (FIORICET) 50-300-40 mg per capsule as needed.     ??? codeine-butalbital-acetaminophen-caffeine (FIORICET WITH CODEINE) 50-325-40-30 mg per capsule      ??? valACYclovir (VALTREX) 1 gram tablet Take 2 tabs by mouth twice a day   Indications: PREVENTION OF HERPES ZOSTER IN IMMUNOCOMPROMISED PATIENT 120 Tab 5   ??? lidocaine (LIDODERM) 5 %(700 mg/patch)   2   ??? BOTOX 200 unit injection        No current facility-administered medications on file prior to visit.        Allergies   Allergen Reactions   ??? Aspirin Unknown (comments)     Gi bleeding   ??? Compazine [Prochlorperazine] Rash   ??? Keflex [Cephalexin] Rash   ??? Nsaids (Non-Steroidal Anti-Inflammatory Drug) Swelling     Throat swells       OB History     Obstetric Comments    Menarche:  16.   LMP: 37.  # of Children:  2.  Age at Delivery of First Child:  30.   Hysterectomy/oophorectomy:  NO/NO.  Breast Bx:  no.  Hx of Breast Feeding:  yes.  BCP:  no. Hormone therapy:  Yes '98 to 12/13.          ROS  Constitutional: Negative    HENT: Negative.   Eyes: Negative.    Respiratory: Negative.   Cardiovascular: Negative.   Gastrointestinal: Negative.   Genitourinary: Negative.   Musculoskeletal: Negative.   Skin: Negative.   Neurological: Negative.   Endo/Heme/Allergies: Negative.   Psychiatric/Behavioral: Negative.    Physical Exam   Cardiovascular: Normal rate and normal heart sounds.    Pulmonary/Chest: Breath sounds normal. Right breast exhibits mass. Right breast exhibits no inverted nipple, no nipple discharge, no skin  change and no tenderness. Left breast exhibits no inverted nipple, no mass, no  nipple discharge, no skin change and no tenderness. Breasts are symmetrical.       Lymphadenopathy:        Right cervical: No superficial cervical, no deep cervical and no posterior cervical adenopathy present.       Left cervical: No superficial cervical, no deep cervical and no posterior cervical adenopathy present.        Right axillary: No pectoral and no lateral adenopathy present.        Left axillary: No pectoral and no lateral adenopathy present.      BREAST ULTRASOUND  Indication: Right  breast mass UOQ  Technique:  The area was scanned using a high-frequency linear-array near-field transducer  Findings: No abnormal mass, lesion, or shadowing noted.  No cysts  Impression: Edge of pectoral muscle with scar tissue on edge of implant  Disposition: No worrisome finding on ultrasound    ASSESSMENT and PLAN    ICD-10-CM ICD-9-CM    1. Breast mass, right N63.10 611.72    2. Malignant neoplasm of lower-inner quadrant of left breast in female, estrogen receptor positive ([place]) C50.312 174.3     Z17.0 V86.0      Pt presents with RIGHT breast mass, likely related to the edge of pectoral muscle and rib. Last MRI looks good. Normal [place] today. F/u in 1 year. This plan was reviewed with the patient and patient agrees. All questions were answered.    Written by Carilyn Goodpasture, ScribeKick, as dictated by Dr. Sandford Craze, MD.

## 2016-08-03 NOTE — Progress Notes (Deleted)
HISTORY OF PRESENT ILLNESS  Emily [name] is a 65 y.o. female.  HPI  ESTABLISHED patient here for new lump to her outer RIGHT breast.  Martin Majestic to for her well check with Dr. Melina Modena this morning and Dr. Melina Modena felt a lump to the outer RIGHT breast.  Denies breast pain but does have shoulder pain.  No skin changes.    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 1/14 nodes positive with a 1 cm met, no ECE. 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  ????  12/25/2012 - 02/26/2013: S/p TC q 3 weeks x 4 (75 mg/m2; 600 mg/m2)  ????  04/02/2013 - 05/10/2013: S/p XRT  ????  05/17/2013 - 07/30/2013: Anastrozole, stopped due to joint pains??  08/06/2013 - 09/30/2013: Exemestane stopped due joint pains  07/10/2013: Zometa q 6 months x 3 years, stopped   09/30/2013 - 03/08/2014: Tamoxifen, stopped 03/08/14 due to joint pains  03/23/2014 - 09/18/2014: Letrozole, stopped due to joint pains  10/04/2014 - 12/15/2014: Tamoxifen, stopped due to hot flashes and mood swings.  12/16/2014 - 01/30/2015: Anastrozole, stopped due to headache and dizziness  02/09/2015 started Toremefine      Last breast MRI done, 01/01/16, BIRADS 1  ROS    Physical Exam    ASSESSMENT and PLAN  {ASSESSMENT/PLAN:19072}

## 2016-08-03 NOTE — Communication Body (Signed)
HISTORY OF PRESENT ILLNESS  Emily [name] is a 65 y.o. female.  HPI  ESTABLISHED patient here for new lump to her outer RIGHT breast.  Martin Majestic to for her well check with Dr. Melina Modena this morning and Dr. Melina Modena felt a lump to the outer RIGHT breast.  Denies breast pain but does have shoulder pain.  No skin changes.      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 1/14 nodes positive with a 1 cm met, no ECE. 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  ????  12/25/2012 - 02/26/2013: S/p TC q 3 weeks x 4 (75 mg/m2; 600 mg/m2)  ????  04/02/2013 - 05/10/2013: S/p XRT  ????  05/17/2013 - 07/30/2013: Anastrozole, stopped due to joint pains??  08/06/2013 - 09/30/2013: Exemestane stopped due joint pains  07/10/2013: Zometa q 6 months x 3 years, stopped   09/30/2013 - 03/08/2014: Tamoxifen, stopped 03/08/14 due to joint pains  03/23/2014 - 09/18/2014: Letrozole, stopped due to joint pains  10/04/2014 - 12/15/2014: Tamoxifen, stopped due to hot flashes and mood swings.  12/16/2014 - 01/30/2015: Anastrozole, stopped due to headache and dizziness  02/09/2015 started Toremefine    Last breast MRI done, 01/01/16, BIRADS 1    Past Medical History:   Diagnosis Date   ??? Anxiety    ??? Arthritis    ??? Cancer ([place])     left breast  cancer   ??? Chronic tension headaches    ??? Concussion 2011   ??? Headache(784.0)     OCCAS   ??? Ill-defined condition     cellulitis L UE   ??? Lymphedema of arm     left   ??? Nausea & vomiting    ??? Other and unspecified symptoms and signs involving general sensations and perceptions     HIVES RECENTLY- WAS IN ER Feb 19, 2013   ??? Other ill-defined conditions(799.89)     PHEOCHROMOCYTOMA       Past Surgical History:   Procedure Laterality Date   ??? ABDOMEN SURGERY PROC UNLISTED      hernia mesh repair   ??? ABDOMEN SURGERY PROC UNLISTED      right adenal removed   ??? BREAST SURGERY PROCEDURE UNLISTED  '91    breast implants; replaced bilat implants 2011   ??? HX APPENDECTOMY      ??? HX BREAST AUGMENTATION Left 04/22/2014    REMOVAL OF LEFT BREAST IMPLANT, WASHOUT AND CLOSURE OF LEFT BREAST WOUND performed by Sharlotte Alamo, MD at [place]   ??? HX BREAST RECONSTRUCTION  11/28/2012    BREAST RECONSTRUCTION /C  INSERTION EXPANDER & ALLODERM performed by Sharlotte Alamo, MD at [place]   ??? HX BREAST RECONSTRUCTION Bilateral 09/16/2013    REMOVAL AND REPLACEMENT LEFT BREAST IMPLANT TISSUE EXPANDERS WITH SUBTOTAL CAPSULECTOMY, RIGHT BREAST IMPLANT REMOVAL AND REPLACEMENT FOR SYMMETRY/FAT GRAFTING TO BILATERAL BREASTS performed by Sharlotte Alamo, MD at [place]   ??? HX GYN      c-section   ??? HX HEENT      T&A   ??? HX HEENT      right adrenal removed   ??? HX MASTECTOMY Left    ??? HX MODIFIED RADICAL MASTECTOMY  11/28/2012    LEFT BREAST MODIFIED RADICAL MASTECTOMY WITH AXILLARY DISSECTION W/ RECONSTRUCTION LEFT BREAST WITH TISSUE EXPANDER AND ALLODERM;PORTACATH INSERTION performed by Ty Hilts, MD at [place]   ??? HX OOPHORECTOMY  right   ??? HX ORTHOPAEDIC      bilateral shoulder arthroscopy   ??? HX VASCULAR ACCESS      PORT, THEN REMOVED       Social History     Social History   ??? Marital status: SINGLE     Spouse name: N/A   ??? Number of children: N/A   ??? Years of education: N/A     Occupational History   ??? Not on file.     Social History Main Topics   ??? Smoking status: Never Smoker   ??? Smokeless tobacco: Never Used   ??? Alcohol use No   ??? Drug use: No   ??? Sexual activity: Not on file     Other Topics Concern   ??? Not on file     Social History Narrative       Current Outpatient Prescriptions on File Prior to Visit   Medication Sig Dispense Refill   ??? FARESTON 60 mg tab TAKE 60 MG BY MOUTH DAILY. 30 Tab 5   ??? diazePAM (VALIUM) 5 mg tablet Take 1 Tab by mouth every six (6) hours as needed for Anxiety. Max Daily Amount: 20 mg. Indications: MUSCLE SPASM 30 Tab 0   ??? oxyCODONE IR (ROXICODONE) 5 mg immediate release tablet Take 5 mg by  mouth every four (4) hours as needed for Pain.     ??? sertraline (ZOLOFT) 100 mg tablet TAKE 1 TAB BY MOUTH DAILY. 30 Tab 3   ??? zolpidem (AMBIEN) 10 mg tablet TAKE 1 TABLET BY MOUTH AT BEDTIME  3   ??? ondansetron (ZOFRAN ODT) 4 mg disintegrating tablet Take 1 Tab by mouth every eight (8) hours as needed for Nausea. 12 Tab 1   ??? butalbital-acetaminophen-caff (FIORICET) 50-300-40 mg per capsule as needed.     ??? codeine-butalbital-acetaminophen-caffeine (FIORICET WITH CODEINE) 50-325-40-30 mg per capsule      ??? valACYclovir (VALTREX) 1 gram tablet Take 2 tabs by mouth twice a day   Indications: PREVENTION OF HERPES ZOSTER IN IMMUNOCOMPROMISED PATIENT 120 Tab 5   ??? lidocaine (LIDODERM) 5 %(700 mg/patch)   2   ??? BOTOX 200 unit injection        No current facility-administered medications on file prior to visit.        Allergies   Allergen Reactions   ??? Aspirin Unknown (comments)     Gi bleeding   ??? Compazine [Prochlorperazine] Rash   ??? Keflex [Cephalexin] Rash   ??? Nsaids (Non-Steroidal Anti-Inflammatory Drug) Swelling     Throat swells       OB History     Obstetric Comments    Menarche:  16.   LMP: 35.  # of Children:  2.  Age at Delivery of First Child:  69.   Hysterectomy/oophorectomy:  NO/NO.  Breast Bx:  no.  Hx of Breast Feeding:  yes.  BCP:  no. Hormone therapy:  Yes '98 to 12/13.          ROS  Constitutional: Negative    HENT: Negative.   Eyes: Negative.    Respiratory: Negative.   Cardiovascular: Negative.   Gastrointestinal: Negative.   Genitourinary: Negative.   Musculoskeletal: Negative.   Skin: Negative.   Neurological: Negative.   Endo/Heme/Allergies: Negative.   Psychiatric/Behavioral: Negative.    Physical Exam   Cardiovascular: Normal rate and normal heart sounds.    Pulmonary/Chest: Breath sounds normal. Right breast exhibits mass. Right breast exhibits no inverted nipple, no nipple discharge, no skin  change and no tenderness. Left breast exhibits no inverted nipple, no mass, no  nipple discharge, no skin change and no tenderness. Breasts are symmetrical.       Lymphadenopathy:        Right cervical: No superficial cervical, no deep cervical and no posterior cervical adenopathy present.       Left cervical: No superficial cervical, no deep cervical and no posterior cervical adenopathy present.        Right axillary: No pectoral and no lateral adenopathy present.        Left axillary: No pectoral and no lateral adenopathy present.      BREAST ULTRASOUND  Indication: Right  breast mass UOQ  Technique:  The area was scanned using a high-frequency linear-array near-field transducer  Findings: No abnormal mass, lesion, or shadowing noted.  No cysts  Impression: Edge of pectoral muscle with scar tissue on edge of implant  Disposition: No worrisome finding on ultrasound    ASSESSMENT and PLAN    ICD-10-CM ICD-9-CM    1. Breast mass, right N63.10 611.72    2. Malignant neoplasm of lower-inner quadrant of left breast in female, estrogen receptor positive ([place]) C50.312 174.3     Z17.0 V86.0      Pt presents with RIGHT breast mass, likely related to the edge of pectoral muscle and rib. Last MRI looks good. Normal [place] today. F/u in 1 year. This plan was reviewed with the patient and patient agrees. All questions were answered.    Written by Carilyn Goodpasture, ScribeKick, as dictated by Dr. Sandford Craze, MD.

## 2016-08-03 NOTE — Patient Instructions (Signed)
Breast Cancer: Care Instructions  Your Care Instructions    Breast cancer occurs when abnormal cells grow out of control in the breast. These cancer cells can spread within the breast, to nearby lymph nodes and other tissues, and to other parts of the body.  Being treated for cancer can weaken your body, and you may feel very tired. Get the rest your body needs so you can feel better.  Finding out that you have cancer is scary. You may feel many emotions and may need some help coping. Seek out family, friends, and counselors for support. You also can do things at home to make yourself feel better while you go through treatment. Call the [employer] (1-800-227-2345) or visit its website at www.cancer.org for more information.  Follow-up care is a key part of your treatment and safety. Be sure to make and go to all appointments, and call your doctor if you are having problems. It's also a good idea to know your test results and keep a list of the medicines you take.  How can you care for yourself at home?  ?? Take your medicines exactly as prescribed. Call your doctor if you think you are having a problem with your medicine. You may get medicine for nausea and vomiting if you have these side effects.  ?? Follow your doctor's instructions to relieve pain. Pain from cancer and surgery can almost always be controlled. Use pain medicine when you first notice pain, before it becomes severe.  ?? Eat healthy food. If you do not feel like eating, try to eat food that has protein and extra calories to keep up your strength and prevent weight loss. Drink liquid meal replacements for extra calories and protein. Try to eat your main meal early.  ?? Get some physical activity every day, but do not get too tired. Keep doing the hobbies you enjoy as your energy allows.  ?? Do not smoke. Smoking can make your cancer worse. If you need help quitting, talk to your doctor about stop-smoking programs and medicines.  These can increase your chances of quitting for good.  ?? Take steps to control your stress and workload. Learn relaxation techniques.  ?? Share your feelings. Stress and tension affect our emotions. By expressing your feelings to others, you may be able to understand and cope with them.  ?? Consider joining a support group. Talking about a problem with your spouse, a good friend, or other people with similar problems is a good way to reduce tension and stress.  ?? Express yourself through art. Try writing, crafts, dance, or art to relieve stress. Some dance, writing, or art groups may be available just for people who have cancer.  ?? Be kind to your body and mind. Getting enough sleep, eating a healthy diet, and taking time to do things you enjoy can contribute to an overall feeling of balance in your life and can help reduce stress.  ?? Get help if you need it. Discuss your concerns with your doctor or counselor.  ?? If you are vomiting or have diarrhea:  ?? Drink plenty of fluids (enough so that your urine is light yellow or clear like water) to prevent dehydration. Choose water and other caffeine-free clear liquids. If you have kidney, heart, or liver disease and have to limit fluids, talk with your doctor before you increase the amount of fluids you drink.  ?? When you are able to eat, try clear soups, mild foods, and liquids until   all symptoms are gone for 12 to 48 hours. Other good choices include dry toast, crackers, cooked cereal, and gelatin dessert, such as Jell-O.  ?? If you have not already done so, prepare a list of advance directives. Advance directives are instructions to your doctor and family members about what kind of care you want if you become unable to speak or express yourself.  When should you call for help?  Call 911 anytime you think you may need emergency care. For example, call if:  ? ?? You passed out (lost consciousness).   ?Call your doctor now or seek immediate medical care if:   ? ?? You have a fever.   ? ?? You have abnormal bleeding.   ? ?? You think you have an infection.   ? ?? You have new or worse pain.   ? ?? You have new symptoms, such as a cough, belly pain, vomiting, diarrhea, or a rash.   ?Watch closely for changes in your health, and be sure to contact your doctor if:  ? ?? You are much more tired than usual.   ? ?? You have swollen glands in your armpits, groin, or neck.   ? ?? You do not get better as expected.   Where can you learn more?  Go to http://www.healthwise.net/GoodHelpConnections.  Enter V321 in the search box to learn more about "Breast Cancer: Care Instructions."  Current as of: Feb 12, 2016  Content Version: 11.4  ?? 2006-2017 [employer]. Care instructions adapted under license by [employer] (which disclaims liability or warranty for this information). If you have questions about a medical condition or this instruction, always ask your healthcare professional. [employer] disclaims any warranty or liability for your use of this information.       Breast Cancer: Care Instructions  Your Care Instructions    Breast cancer occurs when abnormal cells grow out of control in the breast. These cancer cells can spread within the breast, to nearby lymph nodes and other tissues, and to other parts of the body.  Being treated for cancer can weaken your body, and you may feel very tired. Get the rest your body needs so you can feel better.  Finding out that you have cancer is scary. You may feel many emotions and may need some help coping. Seek out family, friends, and counselors for support. You also can do things at home to make yourself feel better while you go through treatment. Call the American Cancer Society (1-800-227-2345) or visit its website at www.cancer.org for more information.  Follow-up care is a key part of your treatment and safety. Be sure to make and go to all appointments, and call your doctor if you are having  problems. It's also a good idea to know your test results and keep a list of the medicines you take.  How can you care for yourself at home?  ?? Take your medicines exactly as prescribed. Call your doctor if you think you are having a problem with your medicine. You may get medicine for nausea and vomiting if you have these side effects.  ?? Follow your doctor's instructions to relieve pain. Pain from cancer and surgery can almost always be controlled. Use pain medicine when you first notice pain, before it becomes severe.  ?? Eat healthy food. If you do not feel like eating, try to eat food that has protein and extra calories to keep up your strength and prevent weight loss. Drink liquid meal   replacements for extra calories and protein. Try to eat your main meal early.  ?? Get some physical activity every day, but do not get too tired. Keep doing the hobbies you enjoy as your energy allows.  ?? Do not smoke. Smoking can make your cancer worse. If you need help quitting, talk to your doctor about stop-smoking programs and medicines. These can increase your chances of quitting for good.  ?? Take steps to control your stress and workload. Learn relaxation techniques.  ?? Share your feelings. Stress and tension affect our emotions. By expressing your feelings to others, you may be able to understand and cope with them.  ?? Consider joining a support group. Talking about a problem with your spouse, a good friend, or other people with similar problems is a good way to reduce tension and stress.  ?? Express yourself through art. Try writing, crafts, dance, or art to relieve stress. Some dance, writing, or art groups may be available just for people who have cancer.  ?? Be kind to your body and mind. Getting enough sleep, eating a healthy diet, and taking time to do things you enjoy can contribute to an overall feeling of balance in your life and can help reduce stress.   ?? Get help if you need it. Discuss your concerns with your doctor or counselor.  ?? If you are vomiting or have diarrhea:  ?? Drink plenty of fluids (enough so that your urine is light yellow or clear like water) to prevent dehydration. Choose water and other caffeine-free clear liquids. If you have kidney, heart, or liver disease and have to limit fluids, talk with your doctor before you increase the amount of fluids you drink.  ?? When you are able to eat, try clear soups, mild foods, and liquids until all symptoms are gone for 12 to 48 hours. Other good choices include dry toast, crackers, cooked cereal, and gelatin dessert, such as Jell-O.  ?? If you have not already done so, prepare a list of advance directives. Advance directives are instructions to your doctor and family members about what kind of care you want if you become unable to speak or express yourself.  When should you call for help?  Call 911 anytime you think you may need emergency care. For example, call if:  ? ?? You passed out (lost consciousness).   ?Call your doctor now or seek immediate medical care if:  ? ?? You have a fever.   ? ?? You have abnormal bleeding.   ? ?? You think you have an infection.   ? ?? You have new or worse pain.   ? ?? You have new symptoms, such as a cough, belly pain, vomiting, diarrhea, or a rash.   ?Watch closely for changes in your health, and be sure to contact your doctor if:  ? ?? You are much more tired than usual.   ? ?? You have swollen glands in your armpits, groin, or neck.   ? ?? You do not get better as expected.   Where can you learn more?  Go to http://www.healthwise.net/GoodHelpConnections.  Enter V321 in the search box to learn more about "Breast Cancer: Care Instructions."  Current as of: Feb 12, 2016  Content Version: 11.4  ?? 2006-2017 [employer]. Care instructions adapted under license by [employer] (which disclaims liability or warranty  for this information). If you have questions about a medical condition or this instruction, always ask your healthcare professional. [employer]   disclaims any warranty or liability for your use of this information.

## 2016-08-05 ENCOUNTER — Inpatient Hospital Stay: Admit: 2016-08-05 | Payer: BLUE CROSS/BLUE SHIELD | Primary: Family Medicine

## 2016-08-05 DIAGNOSIS — I890 Lymphedema, not elsewhere classified: Secondary | ICD-10-CM

## 2016-08-05 NOTE — Progress Notes (Signed)
_                                    [facility]  [address]  [address]  [place]  [address]      LYmphedema Therapy  Visit:2            Daily note                      30 day/10th visit progress note    NAME: BELYNDA [name]  DATE: 08/05/2016    Goals:  1.  Establish effective and comfortable fit of replacement Solaris Tribute compression garment, with pt able to don/doff indep.   08/05/2016 goal met  2.  Order any needed replacement daytime compression garments.  08/05/2016 measurements for replacement daytime custom flat knit compression garments. Goal met.       SUBJECTIVE REPORT:  Pt arrives with replacement Solaris Tribute.  States she has been continuing with wear of current Tribute with outer jacket at night, however, garment is worn and too large.  Pt states she has continued with daily wear of custom flat knit compression armsleeves, stating RM sleeves are not managing forearm involvement.  Pt states she needs to replace current daytime compression sleeves.   Pain: 2/10--primarily reporting fullness/discomfort L forearm regarding lymphedema.           Gait:  indep  ADLs:  indep  Treatment Response:  Good fit of Solaris Tribute.  Measurements completed for replacement daytime compression garments.  Reviewed packet received at evaluation.  Function:LLIS outcomes measure 29/72; 43% impairment.   Improved since 06/14/2016 by 6 points.        TREATMENT AND OBJECTIVE DATA SUMMARY:       Therapeutic Exercise/Procedure      Lymphedema exercise program: N/A   Home program: Patient to perform daily to BID skin care, deep abdominal breathing, exercise routine with patient to continue lymphedema specific exercise program with compression sleeve on within limits of shoulder pain as previously instructed.   Rationale: Exercise will increase the lymph angiomotoricity and tissue pressure of the skin and thus decrease swelling.        Manual Therapy: 11:53 am-12:04 pm  Orthotic management: 12:05-12:50 pm 11 minutes  45 minutes     Area to decongest: L UE   Orthotic management Deferred.  Pt advised to continue self MLD L UE. Pt has Optiflow vest/armsleeve pump, with Dr. Verl Blalock recommending patient avoid using at this time due to complications with breast reconstruction.  Pt obtained pump prior to treatment at this clinic, with pt advised that she may be able to obtain armsleeve for current pump to allow her to apply vasopneumatic pump sequence to L UE only.  Pt advised to contact therapist in [place] who ordered the pump to determine which company provided the device.  Pt can contact the company direct, or can notify me, and we can address the option of obtaining an arm component that would be compatible with her pump motor.  Pt in agreement with plan, with need to discuss use of pump with physician prior to implementing home use if we are able to obtain arm component for current pump.                  Skin/wound care/debridement: Skin intact.  Upper extremity compression: Therapist fit replacement Solaris [employer], with good fit of garment and outer jacket noted.  Note appropriate compression marks on extremity upon removal of garment.  Pt advised to launder garment per instructions prior to wear.  Advised to wear Tribute without outer jacket initially, adding outer jacket as needed if Lelan Pons not providing adequate night time containment of lymphedema.      Measurements completed for custom flat knit compression sleeves x 2, noting changes in measurements primarily at wrist/upper arm.  Pt requires custom compression garments for effective management of lymphedema, with forearm involvement, including fibrotic tissue changes, requiring flat knit garments for optimal containment.      Pt to continue compression garment management as follows:  Compression garment routine:   1.  Apply daytime compression garments to clean, dry extremity first thing in the morning, EVERY MORNING.    2. Wear garment until bedtime, adjusting throughout the day as needed should garment wrinkle or crease.  Problem areas can be at joint, and top of garment, ensuring proximal aspect of garment is positioned appropriately on extremity.  3. Launder compression garments nightly either by hand or washing machine in a garment bag or pillowcase.  Use mild detergent with NO FABRIC SOFTENER, NO BLEACH, NO WOOLITE.  Wash in cool/warm water.    4. Dry garment in dryer on low heat right side out in garment bag or pillowcase.  5. Daytime compression garments are NOT safe to sleep in, so remove at bedtime.    6. Perform skin care to extremity, cleansing skin daily with soap and water, and using low pH lotion, upward strokes to stimulate lymphatic vessels.   7. Apply night time compression garment as instructed to wear while sleeping, adjusting throughout the night as needed for comfort.  8. Perform exercise program with daytime compression stockings on.  Signs/Symptoms of infection include:  Fever, flu-like symptoms, pain/redness/warmth in extremity, sometimes with increase in swelling present.  Stop wearing your compression garment if this occurs.  Call your doctor immediately or go to the Emergency room to address potential infection.  Remove compression with changes in circulation or numbness in extremity, different from what you may experience when not wearing compression garment, pain, unexplained shortness of breath.   Pt advised of risk of wearing sleeve without hand piece, to monitor hand, and return to wear of custom flat knit glove with sleeve if changes to hand present.  Pt verbalizes understanding of all instruction provided today.                          Girth/Volume measurement: Manual therapy: Repeated limb volume measurements L UE, with reduction of 27.34 mL noted, reduction of limb  volume discrepancy to 0.45%, which is excellent.       TOTAL TREATMENT 57 mins       ASSESSMENT:   Treatment effectiveness and tolerance: Note good fit of Solaris Tribute, with pt reporting garment feeling "much better" regarding compression provided, in comparison with previous garment, as garment is worn and no longer fitting patient effectively.  Pt reports noted increase in forearm fullness/fibrotic changes with wear of RM daytime compression sleeves, with custom flat knit compression garments medically necessary for optimal management of lymphedema.  See instruction provided above regarding home management of lymphedema.   Progress toward goals: All goals met.     PLAN OF CARE:   Changes to the plan of care: Pt to fit daytime compression sleeves upon  receiving, notifying therapist of any concerns at that time.  Pt to launder Tribute prior to wearing night, and launder garment every week to 10 days to reduce risk of infection.  Pt to return to clinic in 6 months for evaluation of lymphedema, with pt to call clinic with concerns prior to that time.     Frequency: Return in  6 months   Other: na       Kalman Shan, PT, CLT

## 2016-08-10 ENCOUNTER — Encounter: Attending: Surgery | Primary: Family Medicine

## 2016-08-10 ENCOUNTER — Ambulatory Visit
Admit: 2016-08-10 | Discharge: 2016-08-10 | Payer: PRIVATE HEALTH INSURANCE | Attending: Nurse Practitioner | Primary: Family Medicine

## 2016-08-10 DIAGNOSIS — Z170 Estrogen receptor positive status [ER+]: Secondary | ICD-10-CM

## 2016-08-10 NOTE — Progress Notes (Signed)
Emily [name] is a 65 y.o. female here for follow-up of breast cancer.

## 2016-08-10 NOTE — Progress Notes (Signed)
[facility]  [address], [place]   [address]  W: [phone]   F: [phone]      f/u HEME/ONC CONSULT    Reason for visit: evaluation for treatment for  Breast Cancer    Consulting physician:  Dr. Gilford Rile, Dr. Raynald Blend, Brooke Bonito.  Ref physician:  Dr. Drucilla Schmidt    HPI:   Emily [name] is a 65 y.o.  female who I am seeing for management of breast cancer        She had a screening ultrasound on 09/24/12 showing a 2.6 cm left LN.  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  HER 2 negative IHC at 0.    She has a history of a pheochromocytoma surgically removed in 1997.    She then had a MRI breast on 10/05/12 showing the 2.8 x 2 cm LN as well as a left breast LIQ 0.9 cm x 1.1 cm x 0.6 cm mass.  Biopsy of that mass by FNA on 10/16/12 shows a poorly differentiated carcinoma.  These slides were air-dried, HER 2 not able to be performed on them.    Mammaprint of LN shows high risk luminal, ER + at 0.19, PR + at 0.61, HER 2 negative at -0.69.    She underwent L mastectomy on 11/28/12 showing IDC with neuroendocrine features, gr 2, 0.5 cm, DCIS gr 2-3, + LVI, 1/14 LN + with a 1 cm met, no ECE.  HER 2 negative (ratio 1.2, sig/cell 2.8), ER + 95%, PR + 5%; ki67 5%; DCIS ER + 95%, PR + 1%.    S/p TC q 3 weeks x 4 from 12/25/12-02/26/13 (75 mg/m2; 600 mg/m2)    S/p XRT from 04/02/13-05/10/13    Started anastrozole 05/17/13, stop 07/30/13 (joint pains)    exemestane 08/06/13-09/30/13 (joint pains)    Started zometa 07/10/13, q 6 months x 3 years, stopped     Started tamoxifen 09/30/13, stopped on 03/08/14 due to joint pains    Started letrozole 03/23/14, stopped 09/18/14 due to joint pains     Started tamoxifen 10/04/14, stopped on 12/15/14 due to hot flashes and mood swings.    Anastrozole 12/16/14-01/30/15, stopped due to headache and dizziness    toremefine 02/09/15-    Interval History: Continuing toremefine, tolerating well. Complains of gr 1 constipation, gr 1 hot flashes, gr 1-2 insomnia, gr 1 anxiety, 5/10 right shoulder neck pain, gr 1 mouth sores, gr 1 sob    She had an abnormal well-woman exam with Dr. Melina Modena on 11/1 and was thought to have a right breast mass.  Saw Dr. Drucilla Schmidt that same day with a normal mammogram.  Is very concerned today and is asking for an MRI.    LMP 1996.    DX   Encounter Diagnoses   Name Primary?   ??? Malignant neoplasm of lower-inner quadrant of left breast in female, estrogen receptor positive ([place]) Yes   ??? Breast mass, right    ??? Age-related osteoporosis without current pathological fracture    ??? Rheumatoid arthritis, involving unspecified site, unspecified rheumatoid factor presence ([place])       Past Medical History:   Diagnosis Date   ??? Anxiety    ??? Arthritis    ??? Cancer (  [place])     left breast  cancer   ??? Chronic tension headaches    ??? Concussion 2011   ??? Headache(784.0)     OCCAS   ??? Ill-defined condition     cellulitis L UE   ??? Lymphedema of arm     left   ??? Nausea & vomiting    ??? Other and unspecified symptoms and signs involving general sensations and perceptions     HIVES RECENTLY- WAS IN ER Feb 19, 2013   ??? Other ill-defined conditions(799.89)     PHEOCHROMOCYTOMA     Past Surgical History:   Procedure Laterality Date   ??? ABDOMEN SURGERY PROC UNLISTED      hernia mesh repair   ??? ABDOMEN SURGERY PROC UNLISTED      right adenal removed   ??? BREAST SURGERY PROCEDURE UNLISTED  '91    breast implants; replaced bilat implants 2011   ??? HX APPENDECTOMY     ??? HX BREAST AUGMENTATION Left 04/22/2014    REMOVAL OF LEFT BREAST IMPLANT, WASHOUT AND CLOSURE OF LEFT BREAST WOUND performed by Sharlotte Alamo, MD at [facility]   ??? HX BREAST RECONSTRUCTION  11/28/2012     BREAST RECONSTRUCTION /C  INSERTION EXPANDER & ALLODERM performed by Sharlotte Alamo, MD at [place]   ??? HX BREAST RECONSTRUCTION Bilateral 09/16/2013    REMOVAL AND REPLACEMENT LEFT BREAST IMPLANT TISSUE EXPANDERS WITH SUBTOTAL CAPSULECTOMY, RIGHT BREAST IMPLANT REMOVAL AND REPLACEMENT FOR SYMMETRY/FAT GRAFTING TO BILATERAL BREASTS performed by Sharlotte Alamo, MD at [place]   ??? HX GYN      c-section   ??? HX HEENT      T&A   ??? HX HEENT      right adrenal removed   ??? HX MASTECTOMY Left    ??? HX MODIFIED RADICAL MASTECTOMY  11/28/2012    LEFT BREAST MODIFIED RADICAL MASTECTOMY WITH AXILLARY DISSECTION W/ RECONSTRUCTION LEFT BREAST WITH TISSUE EXPANDER AND ALLODERM;PORTACATH INSERTION performed by Ty Hilts, MD at [facility]   ??? HX OOPHORECTOMY      right   ??? HX ORTHOPAEDIC      bilateral shoulder arthroscopy   ??? HX VASCULAR ACCESS      PORT, THEN REMOVED     Social History     Social History   ??? Marital status: SINGLE     Spouse name: N/A   ??? Number of children: N/A   ??? Years of education: N/A     Social History Main Topics   ??? Smoking status: Never Smoker   ??? Smokeless tobacco: Never Used   ??? Alcohol use No   ??? Drug use: No   ??? Sexual activity: Not Asked     Other Topics Concern   ??? None     Social History Narrative     Family History   Problem Relation Age of Onset   ??? Alzheimer Mother    ??? Diabetes Mother    ??? Other Mother      COMBATIVE POST ANESTHESIA   ??? Heart Disease Father    ??? Diabetes Father    ??? Lung Disease Father      COPD   ??? Headache Sister    ??? Migraines Sister    ??? Anesth Problems Neg Hx        Current Outpatient Prescriptions   Medication Sig Dispense Refill   ??? FARESTON 60 mg tab TAKE 60 MG BY MOUTH  DAILY. 30 Tab 5   ??? BOTOX 200 unit injection      ??? zolpidem (AMBIEN) 10 mg tablet TAKE 1 TABLET BY MOUTH AT BEDTIME  3   ??? ondansetron (ZOFRAN ODT) 4 mg disintegrating tablet Take 1 Tab by mouth every eight (8) hours as needed for Nausea. 12 Tab 1    ??? diazePAM (VALIUM) 5 mg tablet Take 1 Tab by mouth every six (6) hours as needed for Anxiety. Max Daily Amount: 20 mg. Indications: MUSCLE SPASM 30 Tab 0   ??? butalbital-acetaminophen-caff (FIORICET) 50-300-40 mg per capsule as needed.     ??? codeine-butalbital-acetaminophen-caffeine (FIORICET WITH CODEINE) 50-325-40-30 mg per capsule      ??? valACYclovir (VALTREX) 1 gram tablet Take 2 tabs by mouth twice a day   Indications: PREVENTION OF HERPES ZOSTER IN IMMUNOCOMPROMISED PATIENT 120 Tab 5   ??? oxyCODONE IR (ROXICODONE) 5 mg immediate release tablet Take 5 mg by mouth every four (4) hours as needed for Pain.     ??? sertraline (ZOLOFT) 100 mg tablet TAKE 1 TAB BY MOUTH DAILY. 30 Tab 3   ??? lidocaine (LIDODERM) 5 %(700 mg/patch)   2       Allergies   Allergen Reactions   ??? Aspirin Unknown (comments)     Gi bleeding   ??? Compazine [Prochlorperazine] Rash   ??? Keflex [Cephalexin] Rash   ??? Nsaids (Non-Steroidal Anti-Inflammatory Drug) Swelling     Throat swells       Review of Systems    A comprehensive review of systems was performed and all systems were negative except for HPI and for the symptom report form, reviewed and scanned in.      Objective:  Physical Exam:  Visit Vitals   ??? BP 114/68   ??? Pulse 79   ??? Temp 97.5 ??F (36.4 ??C) (Temporal)   ??? Resp 20   ??? Ht '5\' 6"'$  (1.676 m)   ??? Wt 120 lb 3.2 oz (54.5 kg)   ??? SpO2 99%   ??? BMI 19.4 kg/m2       General:  Alert, cooperative, no distress, appears stated age.   Head:  Normocephalic, without obvious abnormality, atraumatic.   Eyes:  Conjunctivae/corneas clear. PERRL, EOMs intact.   Throat: Lips, mucosa, and tongue normal.    Neck: Supple, symmetrical, trachea midline, no adenopathy, thyroid: no enlargement/tenderness/nodules   Back:   Symmetric, no curvature. ROM normal. No CVA tenderness.   Lungs:   Clear to auscultation bilaterally.   Chest wall:  S/p bilateral mastectomy with reconstruction.    Heart:  Regular rate and rhythm, S1, S2 normal, no murmur, click, rub or  gallop.   Abdomen:   Soft, non-tender. Bowel sounds normal. No masses,  No organomegaly.   Extremities:  Extremities normal, atraumatic, no cyanosis.   Skin: No rash   Lymph nodes: Cervical, supraclavicular, and axillary nodes normal.   Neurologic: CNII-XII intact.  Breasts:  Right breast with mass in upper outer quadrant, feels like edge of implant         Diagnostic Imaging   10/12/12 PET:  Negative for distant mets  10/22/12 brain MRI:  Negative    09/24/12 dexa L hip T -2.5; L spine T -2.3  osteoporosis    12/11/13 bilateral MRI  Negative    03/26/14 MRI Brain  Negative    12/05/14 MRI breast   Negative    12/05/14 [place] L breast?? ????  IMPRESSION:  1. Very small, hypoechoic lesion in continuity and  immediately below the ??  dermis overlying the sternum/medialmost left breast. Finding suggests dermal ??  lesion such as epidermal inclusion cyst versus postoperative change.  BI-RADS Category 2 - Benign findings. ??    01/01/16 breast MRI  Negative    Lab Results  Lab Results   Component Value Date/Time    WBC 3.5 10/30/2015 09:13 AM    HGB 13.2 10/30/2015 09:13 AM    HCT 40.3 10/30/2015 09:13 AM    PLATELET 214 10/30/2015 09:13 AM    MCV 90 10/30/2015 09:13 AM       Lab Results   Component Value Date/Time    Sodium 140 10/30/2015 09:13 AM    Potassium 3.9 10/30/2015 09:13 AM    Chloride 100 10/30/2015 09:13 AM    CO2 25 10/30/2015 09:13 AM    Anion gap 10 02/05/2013 09:44 AM    Glucose 79 10/30/2015 09:13 AM    BUN 7 10/30/2015 09:13 AM    Creatinine 0.49 10/30/2015 09:13 AM    BUN/Creatinine ratio 14 10/30/2015 09:13 AM    GFR est AA 121 10/30/2015 09:13 AM    GFR est non-AA 105 10/30/2015 09:13 AM    Calcium 8.2 10/30/2015 09:13 AM    AST (SGOT) 25 10/30/2015 09:13 AM    Alk. phosphatase 49 10/30/2015 09:13 AM    Protein, total 6.9 10/30/2015 09:13 AM    Albumin 4.4 10/30/2015 09:13 AM    Globulin 2.4 02/05/2013 09:44 AM    A-G Ratio 1.8 10/30/2015 09:13 AM    ALT (SGPT) 18 10/30/2015 09:13 AM     Assessment/Plan:   65 y.o. female with a 0.5 cm left breast cancer, IDC with endocrine features, 1/14 LN +, gr 2, high risk mammaprint,  ER and PR +, HER 2 negative. PS 0    1. Breast cancer with neuroendocrine features, Left, lower inner quadrant, stage IIA, [id number]    With no further therapy, her RR is about 36%; endocrine therapy will decrease her RR to 19%; and 2nd gen chemo will decrease her RR to 12%; 3rd gen to 10%.  These #s may be higher with her high risk mammaprint.     Based on her pheo and now breast cancer history, I recommended genetic counseling -- referral to [facility] made at prior visit. Has seen them and they have performed BRCA testing -- negative.  [facility] has run a PGL panel on 09/10/14.    No evidence of recurrence, continue toremifene.       Last eye exam: 10/2015  Last gyn exam: 08/03/2016, going for another [place]    Due for imaging in March 2018, MRI breast. Patient is unable to tolerate mammogram due to pain -- however, she is highly anxious about her well-woman exam and is strongly requesting a MRI.  I am not overly concerned with the physical exam findings, but am not able to tell for sure, will order MRI -- noted that it may not be covered.    Will see her back in 6 months if MRI is normal     2. Right breast upper outer quadrant:  Likely implant, MRI ordered, see above    3. Anxiety/Depression: Stable today; continue sertraline 100 mg daily. Valium prn.  I advised that Dr. Kalman Shan should manage her anti depressant.     4. Osteoporosis: Had received zometa previously, which was give for one dose on 07/10/13. Switched to prolia 60 mg SC q 6 months, received 04/08/14,10/01/14, 03/10/15, 09/14/15. She had one  remaining Prolia injection, but she had oral surgery in July, discontinued    DEXA 10/01/14 at the [facility], patient reports improvement, she will get this done 10/01/16 there      Vit D3 2000 International Units daily, advised her to start this today, discussed again today.      5. Fibromyalgia/RA: Not currently on any treatment, discussed with patient that there are medical options to assist with her pain. She will follow up with her Rheumatologist. She took MTX prior to cancer diagnosis which she did not tolerate.  Previously did not tolerate lyrica due to sedation.      > 25 minutes were spent with this patient with > 50% of that time spent in face to face counseling.              Follow-up Disposition:  Return in about 6 months (around 02/07/2017).    Sonda Rumble, MD

## 2016-08-22 ENCOUNTER — Inpatient Hospital Stay: Admit: 2016-08-22 | Payer: BLUE CROSS/BLUE SHIELD | Attending: Specialist | Primary: Family Medicine

## 2016-08-22 ENCOUNTER — Encounter

## 2016-08-22 DIAGNOSIS — Z853 Personal history of malignant neoplasm of breast: Secondary | ICD-10-CM

## 2016-08-22 MED ORDER — GADOBUTROL 7.5 MMOL/7.5 ML (1 MMOL/ML) IV
7.5 mmol/ mL (1 mmol/mL) | Freq: Once | INTRAVENOUS | Status: AC
Start: 2016-08-22 — End: 2016-08-22
  Administered 2016-08-22: 16:00:00 via INTRAVENOUS

## 2016-08-22 MED FILL — GADAVIST 7.5 MMOL/7.5 ML (1 MMOL/ML) INTRAVENOUS SOLUTION: 7.5 mmol/ mL (1 mmol/mL) | INTRAVENOUS | Qty: 15

## 2016-08-22 NOTE — Progress Notes (Signed)
Please let her know this is negative. Thank you!

## 2016-09-02 ENCOUNTER — Encounter: Payer: BLUE CROSS/BLUE SHIELD | Primary: Family Medicine

## 2016-09-09 ENCOUNTER — Encounter: Attending: Surgery | Primary: Family Medicine

## 2016-09-14 ENCOUNTER — Encounter

## 2016-09-14 ENCOUNTER — Encounter: Primary: Family Medicine

## 2016-09-16 ENCOUNTER — Encounter: Attending: Surgery | Primary: Family Medicine

## 2016-09-16 ENCOUNTER — Encounter

## 2016-09-30 ENCOUNTER — Encounter

## 2016-09-30 ENCOUNTER — Inpatient Hospital Stay: Admit: 2016-09-30 | Primary: Family Medicine

## 2016-10-05 ENCOUNTER — Ambulatory Visit: Payer: Worker's Compensation | Attending: Neuroradiology | Primary: Family Medicine

## 2016-10-05 ENCOUNTER — Ambulatory Visit: Payer: Worker's Compensation | Primary: Family Medicine

## 2016-10-05 NOTE — Telephone Encounter (Signed)
Patient called and stated that she had a bone scan recently, and her OBGYN told her that she thinks she should still be on the Prolia. Patient would like to know if [name] recommends going back on the prolia. CB# [phone]

## 2016-10-05 NOTE — Telephone Encounter (Addendum)
Called the patient and verified ID x 2.  The patient stated that Dr. Crissie Sickles reviewed her bone density study and her T levels are decreasing and inquired if she should have the last Prolia injection.  Informed the patient that per [name] and Abel Presto, NP she has received Zometa and Prolia for a total of five doses and once a copy of the report is received [name] and Abel Presto, NP will review the scan and this office will call the patient back with recommendations.  The patient verbalized understanding and denied any further questions or concerns.

## 2016-10-07 NOTE — Telephone Encounter (Signed)
Called the patient and verified ID x 2.  Informed the patient that [name] received the dexa bone density study and since the patient only received five total doses he recommends that the patient receive the sixth and final dose and inquired if the patient has had any invasive dental work in the past three months or if she has any invasive dental work planned for the next three months.  The patient denied that she has had or will have any invasive dental work in the past three months or the next three months.  Informed the patient that our infusion scheduling department should call her to schedule an appointment today 10/07/16 or Monday 10/10/16 and to call Dr. Valinda Hoar office on Tuesday 10/11/16 if she does not hear from them.  The patient denied any further questions or concerns.

## 2016-10-26 ENCOUNTER — Telehealth

## 2016-10-26 MED ORDER — GABAPENTIN 300 MG CAP
300 mg | ORAL_CAPSULE | Freq: Every evening | ORAL | 1 refills | Status: DC | PRN
Start: 2016-10-26 — End: 2017-02-08

## 2016-10-26 NOTE — Telephone Encounter (Signed)
Called the patient and verified ID x 2.  Informed the patient that [name] recommends Gabapentin as this appears to be neuropathic pain as well as a referral to Dr. Lacey Jensen or Dr. Flonnie Overman who see patients for neuropathy after chemotherapy.  The patient stated that she would like the prescription for Gabapentin [facility] on [address] and would like an appointment with Dr. Lacey Jensen or Dr. Flonnie Overman in the morning between 9:00 am and 12:00 pm.  Informed the patient that the prescription for Gabapentin will be electronically sent to [facility] and a referral will be placed for Dr. Lacey Jensen or Dr. Flonnie Overman with a request for a morning appointment.  Also, informed the patient that Sharen Hones will be made aware of the cost of her Allison Quarry for financial assistance opportunities that may be available.  The patient verbalized understanding and denied any further questions or concerns.    Per [name] a prescription for    Requested Prescriptions     Pending Prescriptions Disp Refills   ??? gabapentin (NEURONTIN) 300 mg capsule 30 Cap 1     Sig: Take 1 Cap by mouth nightly as needed.     [facility] [address]

## 2016-10-26 NOTE — Telephone Encounter (Signed)
Called the patient and verified ID x 2.  Inquired about the patient's headaches and the burning sensation that she is experiencing.  The patient stated that since she finished chemotherapy she would have random episodes of this "burning scalp" like "pins and needles" every six months that would last three to four days and then would go away and that she has had this happen four or five times.  However, this time the "burning" is so severe that she is unable to even scratch her head or lay her head on a pillow because it feels like it is the nerves right under her scalp like  "pins and needles" effect and she does not want to move a "single hair on her head" because her scalp is so tender to the touch and that it started last Sunday 10/23/16 and that it is above her left ear and goes across her head to above her right ear but it is not on the back of her head.  The patient also stated that she saw her PCP who looked at her head and did not "see anything" topical and that her PCP "does not know what is causing this" and started the patient on prednisone but it does not seem to be helping and that this is "debilitating" and that she has stayed in bed all week and that she took Valium and then started to have headaches, her vision "hurts" and tried taking Fioricet but is not getting a lot of relief and she believes that Oxycodone and Fioricet are triggering the headaches and that the headaches are dull and she is nauseous and that she received Botox for migraines last Decemeber and that she feels her body is in "fight or flight" mode and "everything is fighting."  The patient stated that she had the shingles vaccine and that she has had shingles twice in her life.  The patient also stated that she went to her pharmacy and her Allison Quarry is going to cost $380 and that it was $1400 before insurance.  Informed the patient that [name] will be made aware of the  patient's status, questions, and concerns and this office will call back with recommendations.  The patient verbalized understanding and denied any further questions or concerns.

## 2016-10-26 NOTE — Telephone Encounter (Signed)
Patient called and stated that she is having a lot of very painful headaches and a burning sensation on her scalp. Patient stated that the burning sensation happens about every 6 months or so. Patient stated that it is so painful that it hurts to lay on the pillow. Patient stated that she saw her PCP and they are "stumped". CB# [phone]

## 2016-10-26 NOTE — Telephone Encounter (Signed)
Pt called stating she went to pick up her prescription for Fareston however it is $380 and she cannot afford it and she is very concerned because her head hurts and she would like to know what to do about this. Call back number [phone]

## 2016-11-03 ENCOUNTER — Telehealth

## 2016-11-03 MED ORDER — TOREMIFENE 60 MG TABLET
60 mg | ORAL_TABLET | ORAL | 5 refills | Status: DC
Start: 2016-11-03 — End: 2017-04-04

## 2016-11-03 NOTE — Telephone Encounter (Signed)
Called and spoke to patient regarding her Fareston medication and being unable to afford the $318 out of pocket cost. Notified patient that after investigating all financial assistance options, unfortunately there is currently no funding based on her disease category. Informed patient that I have reached out to the drug manufacturer to inquire if there are any financial assistance options they can offer the patient when there is no other funding available. Per the manufacturer, our office can fax in a signed prescription [phone]) to them and they will begin a benefit investigation and apply a coupon that will help bring the cost down. Once they receive the prescription and explore their financial assistance resources, they will reach out to the patient. Abel Presto, NP, notified to sign a prescription for Fareston and this financial counselor will fax it to the manufacturer.     Patient notified and says she will wait to hear from the manufacturer. No further questions/concerns at this time.

## 2016-11-09 NOTE — Telephone Encounter (Signed)
Called patient to see if the manufacturer, Ellison Carwin, reached out to her for financial assistance for her medicine Fareston. Patient stated that the manufacturer secured funding for her through [facility] and stated that they would be faxing the diagnosis verification form to be completed by the physician and faxed back to [employer] Patient stated that she spoke to the manufacturer yesterday and they stated that they sent our office the DX verification form yesterday. Form located and will have Abel Presto, NP, sign as soon as possible and will then fax to [facility].      Patient stated that she is scheduled for an apt with Dr. Garth Bigness at [facility] this Friday at [place] and asked if I could help her cancel this apt for her since she has not been feeling well for a few weeks now with a bad cough. She stated that she had an X-ray done this morning and is doing her breathing treatments as they instructed her to do. She stated that she will call Dr. Gwenyth Ober office to reschedule once she starts feeling better. This FC has called Dr. Gwenyth Ober office and cancelled her apt this Friday.

## 2016-11-24 ENCOUNTER — Encounter

## 2016-12-23 ENCOUNTER — Encounter: Payer: BLUE CROSS/BLUE SHIELD | Primary: Family Medicine

## 2017-02-08 ENCOUNTER — Ambulatory Visit: Admit: 2017-02-08 | Payer: PRIVATE HEALTH INSURANCE | Attending: Specialist | Primary: Family Medicine

## 2017-02-08 DIAGNOSIS — C50312 Malignant neoplasm of lower-inner quadrant of left female breast: Secondary | ICD-10-CM

## 2017-02-08 NOTE — Progress Notes (Signed)
Emily [name] is a 65 y.o. female here for follow-up of breast cancer.

## 2017-02-08 NOTE — Progress Notes (Signed)
[facility]  [place], [place]   [address]  W: [phone]   F: [phone]      f/u HEME/ONC CONSULT    Reason for visit: evaluation for treatment for  Breast Cancer    Consulting physician:  Dr. Gilford Rile, Dr. Raynald Blend, Brooke Bonito.  Ref physician:  Dr. Drucilla Schmidt    HPI:   Emily [name] is a 66 y.o.  female who I am seeing for management of breast cancer        She had a screening ultrasound on 09/24/12 showing a 2.6 cm left LN.  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  HER 2 negative IHC at 0.    She has a history of a pheochromocytoma surgically removed in 1997.    She then had a MRI breast on 10/05/12 showing the 2.8 x 2 cm LN as well as a left breast LIQ 0.9 cm x 1.1 cm x 0.6 cm mass.  Biopsy of that mass by FNA on 10/16/12 shows a poorly differentiated carcinoma.  These slides were air-dried, HER 2 not able to be performed on them.    Mammaprint of LN shows high risk luminal, ER + at 0.19, PR + at 0.61, HER 2 negative at -0.69.    She underwent L mastectomy on 11/28/12 showing IDC with neuroendocrine features, gr 2, 0.5 cm, DCIS gr 2-3, + LVI, 1/14 LN + with a 1 cm met, no ECE.  HER 2 negative (ratio 1.2, sig/cell 2.8), ER + 95%, PR + 5%; ki67 5%; DCIS ER + 95%, PR + 1%.    S/p TC q 3 weeks x 4 from 12/25/12-02/26/13 (75 mg/m2; 600 mg/m2)    S/p XRT from 04/02/13-05/10/13    Started anastrozole 05/17/13, stop 07/30/13 (joint pains)    exemestane 08/06/13-09/30/13 (joint pains)    Started zometa 07/10/13, q 6 months x 3 years, stopped     Started tamoxifen 09/30/13, stopped on 03/08/14 due to joint pains    Started letrozole 03/23/14, stopped 09/18/14 due to joint pains     Started tamoxifen 10/04/14, stopped on 12/15/14 due to hot flashes and mood swings.    Anastrozole 12/16/14-01/30/15, stopped due to headache and dizziness    Toremefine 02/09/15-    Interval History: In today for follow up.  Complains of gr 1 fatigue, gr 1 hot flashes, gr 2 insomnia, gr 1 cognition, gr 1 concentration, gr 1 anxiety, 5/10 pain to joints-neck, shoulder, knees, gr 1 mouth sores, gr 1 swelling, gr 1-2 headaches, gr 2 libido, gr 1 vaginal dryness. On toremefine but not tolerating well due to joint pain and muscle pain.       LMP 1996.    DX   Encounter Diagnoses   Name Primary?   ??? Malignant neoplasm of lower-inner quadrant of left female breast, unspecified estrogen receptor status ([place]) Yes   ??? Breast mass, right    ??? Rheumatoid arthritis, involving unspecified site, unspecified rheumatoid factor presence ([place])    ??? Age-related osteoporosis without current pathological fracture    ??? Chronic arthralgias of knees and hips       Past Medical History:   Diagnosis Date   ??? Anxiety    ??? Arthritis    ??? Cancer (  [place])     left breast  cancer   ??? Chronic tension headaches    ??? Concussion 2011   ??? Headache(784.0)     OCCAS   ??? Ill-defined condition     cellulitis L UE   ??? Lymphedema of arm     left   ??? Nausea & vomiting    ??? Other and unspecified symptoms and signs involving general sensations and perceptions     HIVES RECENTLY- WAS IN ER Feb 19, 2013   ??? Other ill-defined conditions(799.89)     PHEOCHROMOCYTOMA     Past Surgical History:   Procedure Laterality Date   ??? ABDOMEN SURGERY PROC UNLISTED      hernia mesh repair   ??? ABDOMEN SURGERY PROC UNLISTED      right adenal removed   ??? BREAST SURGERY PROCEDURE UNLISTED  '91    breast implants; replaced bilat implants 2011   ??? HX APPENDECTOMY     ??? HX BREAST AUGMENTATION Left 04/22/2014    REMOVAL OF LEFT BREAST IMPLANT, WASHOUT AND CLOSURE OF LEFT BREAST WOUND performed by Sharlotte Alamo, MD at [place]   ??? HX BREAST RECONSTRUCTION  11/28/2012     BREAST RECONSTRUCTION /C  INSERTION EXPANDER & ALLODERM performed by Sharlotte Alamo, MD at [place]   ??? HX BREAST RECONSTRUCTION Bilateral 09/16/2013    REMOVAL AND REPLACEMENT LEFT BREAST IMPLANT TISSUE EXPANDERS WITH SUBTOTAL CAPSULECTOMY, RIGHT BREAST IMPLANT REMOVAL AND REPLACEMENT FOR SYMMETRY/FAT GRAFTING TO BILATERAL BREASTS performed by Sharlotte Alamo, MD at [place]   ??? HX GYN      c-section   ??? HX HEENT      T&A   ??? HX HEENT      right adrenal removed   ??? HX MASTECTOMY Left    ??? HX MODIFIED RADICAL MASTECTOMY  11/28/2012    LEFT BREAST MODIFIED RADICAL MASTECTOMY WITH AXILLARY DISSECTION W/ RECONSTRUCTION LEFT BREAST WITH TISSUE EXPANDER AND ALLODERM;PORTACATH INSERTION performed by Ty Hilts, MD at [place]   ??? HX OOPHORECTOMY      right   ??? HX ORTHOPAEDIC      bilateral shoulder arthroscopy   ??? HX VASCULAR ACCESS      PORT, THEN REMOVED     Social History     Social History   ??? Marital status: SINGLE     Spouse name: N/A   ??? Number of children: N/A   ??? Years of education: N/A     Social History Main Topics   ??? Smoking status: Never Smoker   ??? Smokeless tobacco: Never Used   ??? Alcohol use No   ??? Drug use: No   ??? Sexual activity: Not Asked     Other Topics Concern   ??? None     Social History Narrative     Family History   Problem Relation Age of Onset   ??? Alzheimer Mother    ??? Diabetes Mother    ??? Other Mother      COMBATIVE POST ANESTHESIA   ??? Heart Disease Father    ??? Diabetes Father    ??? Lung Disease Father      COPD   ??? Headache Sister    ??? Migraines Sister    ??? Anesth Problems Neg Hx        Current Outpatient Prescriptions   Medication Sig Dispense Refill   ??? toremifene (FARESTON) 60 mg tab TAKE 60 MG BY  MOUTH DAILY. 30 Tab 5   ??? zolpidem (AMBIEN) 10 mg tablet TAKE 1 TABLET BY MOUTH AT BEDTIME  3   ??? valACYclovir (VALTREX) 1 gram tablet Take 2 tabs by mouth twice a day   Indications: PREVENTION OF HERPES ZOSTER IN IMMUNOCOMPROMISED PATIENT 120 Tab 5    ??? sertraline (ZOLOFT) 100 mg tablet TAKE 1 TAB BY MOUTH DAILY. 30 Tab 3   ??? BOTOX 200 unit injection      ??? ondansetron (ZOFRAN ODT) 4 mg disintegrating tablet Take 1 Tab by mouth every eight (8) hours as needed for Nausea. 12 Tab 1   ??? codeine-butalbital-acetaminophen-caffeine (FIORICET WITH CODEINE) 50-325-40-30 mg per capsule      ??? oxyCODONE IR (ROXICODONE) 5 mg immediate release tablet Take 5 mg by mouth every four (4) hours as needed for Pain.         Allergies   Allergen Reactions   ??? Aspirin Unknown (comments)     Gi bleeding   ??? Compazine [Prochlorperazine] Rash   ??? Keflex [Cephalexin] Rash   ??? Nsaids (Non-Steroidal Anti-Inflammatory Drug) Swelling     Throat swells       Review of Systems    A comprehensive review of systems was performed and all systems were negative except for HPI and for the symptom report form, reviewed and scanned in.      Objective:Physical Exam:  Visit Vitals   ??? BP 143/67   ??? Pulse 73   ??? Temp 97.5 ??F (36.4 ??C) (Temporal)   ??? Resp 18   ??? Ht _0  (1.676 m)   ??? Wt 117 lb 12.8 oz (53.4 kg)   ??? SpO2 99%   ??? BMI 19.01 kg/m2       General:  Alert, cooperative, no distress, appears stated age.   Head:  Normocephalic, without obvious abnormality, atraumatic.   Eyes:  Conjunctivae/corneas clear. PERRL, EOMs intact.   Throat: Lips, mucosa, and tongue normal.    Neck: Supple, symmetrical, trachea midline, no adenopathy, thyroid: no enlargement/tenderness/nodules   Back:   Symmetric, no curvature. ROM normal. No CVA tenderness.   Lungs:   Clear to auscultation bilaterally.   Chest wall:  S/p bilateral mastectomy with reconstruction.    Heart:  Regular rate and rhythm, S1, S2 normal, no murmur, click, rub or gallop.   Abdomen:   Soft, non-tender. Bowel sounds normal. No masses,  No organomegaly.   Extremities:  Extremities normal, atraumatic, no cyanosis.   Skin: No rash   Lymph nodes: Cervical, supraclavicular, and axillary nodes normal.   Neurologic: CNII-XII intact.            Diagnostic Imaging   10/12/12 PET:  Negative for distant mets  10/22/12 brain MRI:  Negative    09/24/12 dexa L hip T -2.5; L spine T -2.3  osteoporosis    12/11/13 bilateral MRI  Negative    03/26/14 MRI Brain  Negative    12/05/14 MRI breast   Negative    12/05/14 [place] L breast?? ????  IMPRESSION:  1. Very small, hypoechoic lesion in continuity and immediately below the ??  dermis overlying the sternum/medialmost left breast. Finding suggests dermal ??  lesion such as epidermal inclusion cyst versus postoperative change.  BI-RADS Category 2 - Benign findings. ??    01/01/16 breast MRI  Negative    08/22/16 Breast MRI  Negative    Lab Results  Lab Results   Component Value Date/Time    WBC 3.5 10/30/2015 09:13 AM  HGB 13.2 10/30/2015 09:13 AM    HCT 40.3 10/30/2015 09:13 AM    PLATELET 214 10/30/2015 09:13 AM    MCV 90 10/30/2015 09:13 AM       Lab Results   Component Value Date/Time    Sodium 140 10/30/2015 09:13 AM    Potassium 3.9 10/30/2015 09:13 AM    Chloride 100 10/30/2015 09:13 AM    CO2 25 10/30/2015 09:13 AM    Anion gap 10 02/05/2013 09:44 AM    Glucose 79 10/30/2015 09:13 AM    BUN 7 (L) 10/30/2015 09:13 AM    Creatinine 0.49 (L) 10/30/2015 09:13 AM    BUN/Creatinine ratio 14 10/30/2015 09:13 AM    GFR est AA 121 10/30/2015 09:13 AM    GFR est non-AA 105 10/30/2015 09:13 AM    Calcium 8.2 (L) 10/30/2015 09:13 AM    AST (SGOT) 25 10/30/2015 09:13 AM    Alk. phosphatase 49 10/30/2015 09:13 AM    Protein, total 6.9 10/30/2015 09:13 AM    Albumin 4.4 10/30/2015 09:13 AM    Globulin 2.4 02/05/2013 09:44 AM    A-G Ratio 1.8 10/30/2015 09:13 AM    ALT (SGPT) 18 10/30/2015 09:13 AM     Assessment/Plan:  67 y.o. female with a 0.5 cm left breast cancer, IDC with endocrine features, 1/14 LN +, gr 2, high risk mammaprint,  ER and PR +, HER 2 negative. PS 0    1. Breast cancer with neuroendocrine features, Left, lower inner quadrant, stage IIA, T1bN1aMx    With no further therapy, her RR is about 36%; endocrine therapy will  decrease her RR to 19%; and 2nd gen chemo will decrease her RR to 12%; 3rd gen to 10%.  These #s may be higher with her high risk mammaprint.     Based on her pheo and now breast cancer history, I recommended genetic counseling -- referral to [facility] made at prior visit. Has seen them and they have performed BRCA testing -- negative.  [facility] has run a PGL panel on 09/10/14.    No evidence of recurrence, continue toremifene.   Will plan to stop 05/2018.  She will take a 2 week vacation from it right now.    Last eye exam: 10/2015, she is going to schedule this.   Last gyn exam: 08/03/2016    Breast MRI 08/22/16 negative, next due 08/2017.    2. Right breast upper outer quadrant:  Likely implant, MRI 08/22/16 negative.    3. Anxiety/Depression: Stable today; continue sertraline 100 mg daily. Valium prn.  Dr. Kalman Shan is managing her antidepressant    4. Osteoporosis: Had received zometa previously, which was given for one dose on 07/10/13. Switched to prolia 60 mg SC q 6 months, received 04/08/14,10/01/14, 03/10/15, 09/14/15. She had one remaining Prolia injection, but she had oral surgery in July, discontinued    DEXA 10/01/14 at the [facility], patient reports improvement. Repeat DEXA 10/07/16 shows severe osteopenia, but slight improvement    Currently taking Vit D3 2000 International Units daily.    5. Fibromyalgia/RA: Not currently on any treatment, previously discussed with patient that there are medical options to assist with her pain. She continues to follow up with her Rheumatologist every 6 weeks. She took MTX prior to cancer diagnosis which she did not tolerate.  Previously did not tolerate lyrica due to sedation.  She reports she has been receiving cortisone injections.     > 25 minutes were spent with this patient  with > 50% of that time spent in face to face counseling.              Follow-up Disposition:  Return in about 6 months (around 08/11/2017).    Sonda Rumble, MD

## 2017-03-03 ENCOUNTER — Encounter: Payer: BLUE CROSS/BLUE SHIELD | Primary: Family Medicine

## 2017-04-04 ENCOUNTER — Encounter

## 2017-04-04 MED ORDER — TOREMIFENE 60 MG TABLET
60 mg | ORAL_TABLET | ORAL | 5 refills | Status: DC
Start: 2017-04-04 — End: 2017-04-10

## 2017-04-10 ENCOUNTER — Encounter

## 2017-04-10 MED ORDER — TOREMIFENE 60 MG TABLET
60 mg | ORAL_TABLET | ORAL | 5 refills | Status: DC
Start: 2017-04-10 — End: 2017-04-26

## 2017-04-10 NOTE — Telephone Encounter (Signed)
Patient called and stated that her out of pocket and the [facility]. [facility]

## 2017-04-12 ENCOUNTER — Encounter

## 2017-04-17 ENCOUNTER — Inpatient Hospital Stay: Admit: 2017-04-17 | Discharge: 2017-04-17 | Payer: BLUE CROSS/BLUE SHIELD | Primary: Family Medicine

## 2017-04-17 ENCOUNTER — Encounter

## 2017-04-17 DIAGNOSIS — C50911 Malignant neoplasm of unspecified site of right female breast: Secondary | ICD-10-CM

## 2017-04-20 ENCOUNTER — Encounter

## 2017-04-26 ENCOUNTER — Encounter

## 2017-04-26 MED ORDER — TOREMIFENE 60 MG TABLET
60 mg | ORAL_TABLET | ORAL | 5 refills | Status: DC
Start: 2017-04-26 — End: 2017-08-14

## 2017-04-26 NOTE — Telephone Encounter (Signed)
Christy from [facility] called and requested a refill on pts [place].

## 2017-06-07 ENCOUNTER — Ambulatory Visit
Admit: 2017-06-07 | Discharge: 2017-06-07 | Payer: PRIVATE HEALTH INSURANCE | Attending: Surgery | Primary: Family Medicine

## 2017-06-07 ENCOUNTER — Encounter

## 2017-06-07 DIAGNOSIS — Z170 Estrogen receptor positive status [ER+]: Secondary | ICD-10-CM

## 2017-06-07 NOTE — Telephone Encounter (Signed)
Cancer rehab, [facility]

## 2017-06-07 NOTE — Progress Notes (Deleted)
HISTORY OF PRESENT ILLNESS  Emily [name] is a 66 y.o. female.  HPI   ESTABLISHED patient here today for RIGHT axilla lump and lump on back. She continues to palpate lump in RIGHT axilla from before. States lump is bigger, hard, and mobile. It is starting to effect her arm and pressing on her nerves. She palpated lump on her back a few months ago. She continues to wear her LEFT arm lymphedema sleeve and takes Toremefine daily. Denies any other breast problems at this time.     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 1/14 nodes positive with a 1 cm met, no ECE. 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  ??  12/25/2012 - 02/26/2013: S/p TC q 3 weeks x 4 (75 mg/m2; 600 mg/m2)  ??  04/02/2013 - 05/10/2013: S/p XRT  ??  05/17/2013 - 07/30/2013: Anastrozole, stopped due to joint pains??  08/06/2013 - 09/30/2013: Exemestane stopped due joint pains  07/10/2013: Zometa q 6 months x 3 years, stopped   09/30/2013 - 03/08/2014: Tamoxifen, stopped 03/08/14 due to joint pains  03/23/2014 - 09/18/2014: Letrozole, stopped due to joint pains  10/04/2014 - 12/15/2014: Tamoxifen, stopped due to hot flashes and mood swings.  12/16/2014 - 01/30/2015: Anastrozole, stopped due to headache and dizziness  02/09/2015 started Toremefine    Recent imaging-   RIGHT axilla ultrasound on 04/17/17-   Findings:  Real-time grayscale and color flow ultrasound was performed in the area of  concern in the right axilla.  ??  There is a knee elongated, heterogeneous, hyperechoic lesion in the right axilla  measuring 1.7 x 0.3 x 1.2 cm. This lesion is hyperechoic to the adjacent  subcutaneous fat and is superficially located within a millimeter of the skin  surface. No blood flow is seen within this lesion.  ??  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 Given that this lesion is  palpable, clinical follow-up is recommended.    Breast MRI in 08/2016-   RIGHT BREAST:   Background parenchymal enhancement: Mild   Postsurgical changes in the right breast inferiorly. There is a subpectoral,  silicone implant.  ??  There are no suspicious internal mammary or axillary chain lymph nodes.    LEFT BREAST:  Background parenchymal enhancement: None  The patient is status post mastectomy. There is a subpectoral, silicone implant.  ??  There are no suspicious internal mammary or axillary chain lymph nodes.    IMPRESSION:  RIGHT BREAST:  1. BI-RADS Category 2 - Benign findings.     LEFT BREAST:  1. BI-RADS Category 2 - Benign findings.       ROS    Physical Exam    ASSESSMENT and PLAN  {ASSESSMENT/PLAN:19072}

## 2017-06-07 NOTE — Telephone Encounter (Signed)
NOTIFIED SYDNEY AT [facility]. THE OFFICE WILL CALL   THE PATIENT WITH AN APPOINTMENT.

## 2017-06-07 NOTE — Telephone Encounter (Signed)
MRI risk >20%, masses x 2 upper outer/axillary tail left reconstructed breast

## 2017-06-07 NOTE — Communication Body (Signed)
HISTORY OF PRESENT ILLNESS  Emily [name] is a 66 y.o. female.  HPI  ESTABLISHED patient here today for RIGHT axilla lump and lump on back. She continues to palpate lump in RIGHT axilla from before. States lump is bigger, hard, and mobile. It is starting to effect her arm and pressing on her nerves. She palpated lump on her back a few months ago. She continues to wear her LEFT arm lymphedema sleeve and takes Toremefine daily. Denies any other breast problems at this time.   ??  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 1/14 nodes positive with a 1 cm met, no ECE. 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  ????  12/25/2012 - 02/26/2013: S/p TC q 3 weeks x 4 (75 mg/m2; 600 mg/m2)  ????  04/02/2013 - 05/10/2013: S/p XRT  ????  05/17/2013 - 07/30/2013: Anastrozole, stopped due to joint pains??  08/06/2013 - 09/30/2013: Exemestane stopped due joint pains  07/10/2013: Zometa q 6 months x 3 years, stopped   09/30/2013 - 03/08/2014: Tamoxifen, stopped 03/08/14 due to joint pains  03/23/2014 - 09/18/2014: Letrozole, stopped due to joint pains  10/04/2014 - 12/15/2014: Tamoxifen, stopped due to hot flashes and mood swings.  12/16/2014 - 01/30/2015: Anastrozole, stopped due to headache and dizziness  02/09/2015 started Toremefine  ??  Recent imaging-   RIGHT axilla ultrasound on 04/17/17-   Findings:  Real-time grayscale and color flow ultrasound was performed in the area of  concern in the right axilla.  ????  There is a knee elongated, heterogeneous, hyperechoic lesion in the right axilla  measuring 1.7 x 0.3 x 1.2 cm. This lesion is hyperechoic to the adjacent  subcutaneous fat and is superficially located within a millimeter of the skin  surface. No blood flow is seen within this lesion.  ????  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 Given that this lesion is  palpable, clinical follow-up is recommended.  ??  Breast MRI in 08/2016-   RIGHT BREAST:   Background parenchymal enhancement: Mild   Postsurgical changes in the right breast inferiorly. There is a subpectoral,  silicone implant.  ????  There are no suspicious internal mammary or axillary chain lymph nodes.  ??  LEFT BREAST:  Background parenchymal enhancement: None  The patient is status post mastectomy. There is a subpectoral, silicone implant.  ????  There are no suspicious internal mammary or axillary chain lymph nodes.  ??  IMPRESSION:  RIGHT BREAST:  1. BI-RADS Category 2 - Benign findings.   ??  LEFT BREAST:  1. BI-RADS Category 2 - Benign findings.     Past Medical History:   Diagnosis Date   ??? Anxiety    ??? Arthritis    ??? Cancer ([place])     left breast  cancer   ??? Chronic tension headaches    ??? Concussion 2011   ??? Headache(784.0)     OCCAS   ??? Ill-defined condition     cellulitis L UE   ??? Lymphedema of arm     left   ??? Nausea & vomiting    ??? Other and unspecified symptoms and signs involving general sensations and perceptions     HIVES RECENTLY- WAS IN ER Feb 19, 2013   ??? Other ill-defined conditions(799.89)     PHEOCHROMOCYTOMA       Past Surgical History:   Procedure Laterality Date   ??? ABDOMEN SURGERY PROC UNLISTED  hernia mesh repair   ??? ABDOMEN SURGERY PROC UNLISTED      right adenal removed   ??? BREAST SURGERY PROCEDURE UNLISTED  '91    breast implants; replaced bilat implants 2011   ??? HX APPENDECTOMY     ??? HX BREAST AUGMENTATION Left 04/22/2014    REMOVAL OF LEFT BREAST IMPLANT, WASHOUT AND CLOSURE OF LEFT BREAST WOUND performed by Sharlotte Alamo, MD at [place]   ??? HX BREAST RECONSTRUCTION  11/28/2012    BREAST RECONSTRUCTION /C  INSERTION EXPANDER & ALLODERM performed by Sharlotte Alamo, MD at [place]   ??? HX BREAST RECONSTRUCTION Bilateral 09/16/2013    REMOVAL AND REPLACEMENT LEFT BREAST IMPLANT TISSUE EXPANDERS WITH SUBTOTAL CAPSULECTOMY, RIGHT BREAST IMPLANT REMOVAL AND REPLACEMENT FOR SYMMETRY/FAT GRAFTING TO BILATERAL BREASTS performed by Sharlotte Alamo, MD at [place]    ??? HX GYN      c-section   ??? HX HEENT      T&A   ??? HX HEENT      right adrenal removed   ??? HX MASTECTOMY Left    ??? HX MODIFIED RADICAL MASTECTOMY  11/28/2012    LEFT BREAST MODIFIED RADICAL MASTECTOMY WITH AXILLARY DISSECTION W/ RECONSTRUCTION LEFT BREAST WITH TISSUE EXPANDER AND ALLODERM;PORTACATH INSERTION performed by Ty Hilts, MD at [place]   ??? HX OOPHORECTOMY      right   ??? HX ORTHOPAEDIC      bilateral shoulder arthroscopy   ??? HX VASCULAR ACCESS      PORT, THEN REMOVED       Social History     Social History   ??? Marital status: SINGLE     Spouse name: N/A   ??? Number of children: N/A   ??? Years of education: N/A     Occupational History   ??? Not on file.     Social History Main Topics   ??? Smoking status: Never Smoker   ??? Smokeless tobacco: Never Used   ??? Alcohol use No   ??? Drug use: No   ??? Sexual activity: Not on file     Other Topics Concern   ??? Not on file     Social History Narrative       Current Outpatient Prescriptions on File Prior to Visit   Medication Sig Dispense Refill   ??? toremifene (FARESTON) 60 mg tab TAKE 60 MG BY MOUTH DAILY. 30 Tab 5   ??? sertraline (ZOLOFT) 100 mg tablet TAKE 1 TAB BY MOUTH DAILY. 30 Tab 3   ??? BOTOX 200 unit injection      ??? zolpidem (AMBIEN) 10 mg tablet TAKE 1 TABLET BY MOUTH AT BEDTIME  3   ??? ondansetron (ZOFRAN ODT) 4 mg disintegrating tablet Take 1 Tab by mouth every eight (8) hours as needed for Nausea. 12 Tab 1   ??? codeine-butalbital-acetaminophen-caffeine (FIORICET WITH CODEINE) 50-325-40-30 mg per capsule      ??? valACYclovir (VALTREX) 1 gram tablet Take 2 tabs by mouth twice a day   Indications: PREVENTION OF HERPES ZOSTER IN IMMUNOCOMPROMISED PATIENT 120 Tab 5   ??? oxyCODONE IR (ROXICODONE) 5 mg immediate release tablet Take 5 mg by mouth every four (4) hours as needed for Pain.       No current facility-administered medications on file prior to visit.        Allergies   Allergen Reactions   ??? Aspirin Unknown (comments)     Gi bleeding    ???  Compazine [Prochlorperazine] Rash   ??? Keflex [Cephalexin] Rash   ??? Nsaids (Non-Steroidal Anti-Inflammatory Drug) Swelling     Throat swells       OB History     Obstetric Comments    Menarche:  16.   LMP: 58.  # of Children:  2.  Age at Delivery of First Child:  59.   Hysterectomy/oophorectomy:  NO/NO.  Breast Bx:  no.  Hx of Breast Feeding:  yes.  BCP:  no. Hormone therapy:  Yes '98 to 12/13.        ROS    Physical Exam   Cardiovascular: Normal rate and normal heart sounds.    Pulmonary/Chest: Breath sounds normal.         Right breast exhibits mass. Right breast exhibits no inverted nipple, no nipple discharge, no skin change and no tenderness. Left breast exhibits mass. Left breast exhibits no inverted nipple, no nipple discharge, no skin change and no tenderness. Breasts are symmetrical.       Lymphadenopathy:        Right cervical: No superficial cervical, no deep cervical and no posterior cervical adenopathy present.       Left cervical: No superficial cervical, no deep cervical and no posterior cervical adenopathy present.        Right axillary: No pectoral and no lateral adenopathy present.        Left axillary: No pectoral and no lateral adenopathy present.    BREAST ULTRASOUND  Indication: Left breast mass UOQ  Technique:  The area was scanned using a high-frequency linear-array near-field transducer  Findings: 2 hypoechoic masses, 1/0cm and 1.6cm  Impression: Indeterminate masses  Disposition: Ultrasound-guided biopsy performed    ASSESSMENT and PLAN    ICD-10-CM ICD-9-CM    1. Malignant neoplasm of lower-inner quadrant of left breast in female, estrogen receptor positive (HCC) C50.312 174.3     Z17.0 V86.0    2. Breast mass, left N63.20 611.72    3. Arm mass, right R22.31 782.2    4. Mass on back R22.2 782.2       Patient with hx of LEFT breast IDC presents for evaluation of enlarging RIGHT mass under RIGHT arm, which is bothersome when lifting her arm. She  also notes possible lipoma on her back which she feels is "attached" to her spine.    Probable lipomas at back and under RIGHT arm. Pt has difficulty with BL shoulder ROM on exam today. Reviewed that mass under RIGHT arm was consistent with lipoma on recent US, and is probably not related to shoulder pain and difficulty with ROM. Will refer to PT for further assistance with BL arm ROM.    Masses at LEFT breast UOQ on exam. [place] visualizes 2 hypoechoic masses at UOQ of reconstructed LEFT breast. Reviewed MRI with Dr. Deirdre Pippins and he feels these may have been present on prior images and recommends followup MRI to assess. Will schedule for November. This plan was reviewed with the patient and patient agrees. All questions were answered.    Written by Hart Robinsons, [employer], as dictated by Dr. Sandford Craze, MD.

## 2017-06-07 NOTE — Patient Instructions (Signed)
Breast Self-Exam: Care Instructions  Your Care Instructions    A breast self-exam is when you check your breasts for lumps or changes. This regular exam helps you learn how your breasts normally look and feel. Most breast problems or changes are not because of cancer.  Breast self-exam is not a substitute for a mammogram. Having regular breast exams by your doctor and regular mammograms improve your chances of finding any problems with your breasts.  Some women set a time each month to do a step-by-step breast self-exam. Other women like a less formal system. They might look at their breasts as they brush their teeth, or feel their breasts once in a while in the shower.  If you notice a change in your breast, tell your doctor.  Follow-up care is a key part of your treatment and safety. Be sure to make and go to all appointments, and call your doctor if you are having problems. It's also a good idea to know your test results and keep a list of the medicines you take.  How do you do a breast self-exam?  ?? The best time to examine your breasts is usually one week after your menstrual period begins. Your breasts should not be tender then. If you do not have periods, you might do your exam on a day of the month that is easy to remember.  ?? To examine your breasts:  ?? Remove all your clothes above the waist and lie down. When you are lying down, your breast tissue spreads evenly over your chest wall, which makes it easier to feel all your breast tissue.  ?? Use the pads-not the fingertips-of the 3 middle fingers of your left hand to check your right breast. Move your fingers slowly in small coin-sized circles that overlap.  ?? Use three levels of pressure to feel of all your breast tissue. Use light pressure to feel the tissue close to the skin surface. Use medium pressure to feel a little deeper. Use firm pressure to feel your tissue close to your breastbone and ribs. Use each pressure level to feel your  breast tissue before moving on to the next spot.  ?? Check your entire breast, moving up and down as if following a strip from the collarbone to the bra line, and from the armpit to the ribs. Repeat until you have covered the entire breast.  ?? Repeat this procedure for your left breast, using the pads of the 3 middle fingers of your right hand.  ?? To examine your breasts while in the shower:  ?? Place one arm over your head and lightly soap your breast on that side.  ?? Using the pads of your fingers, gently move your hand over your breast (in the strip pattern described above), feeling carefully for any lumps or changes.  ?? Repeat for the other breast.  ?? Have your doctor inspect anything you notice to see if you need further testing.  Where can you learn more?  Go to http://www.healthwise.net/[facility].  Enter P148 in the search box to learn more about "Breast Self-Exam: Care Instructions."  Current as of: Feb 12, 2016  Content Version: 11.7  ?? 2006-2018 [employer]. Care instructions adapted under license by [employer] (which disclaims liability or warranty for this information). If you have questions about a medical condition or this instruction, always ask your healthcare professional. [employer] disclaims any warranty or liability for your use of this information.

## 2017-06-07 NOTE — Progress Notes (Signed)
HISTORY OF PRESENT ILLNESS  Emily [name] is a 66 y.o. female.  HPI  ESTABLISHED patient here today for RIGHT axilla lump and lump on back. She continues to palpate lump in RIGHT axilla from before. States lump is bigger, hard, and mobile. It is starting to effect her arm and pressing on her nerves. She palpated lump on her back a few months ago. She continues to wear her LEFT arm lymphedema sleeve and takes Toremefine daily. Denies any other breast problems at this time.   ??  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 1/14 nodes positive with a 1 cm met, no ECE. 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  ????  12/25/2012 - 02/26/2013: S/p TC q 3 weeks x 4 (75 mg/m2; 600 mg/m2)  ????  04/02/2013 - 05/10/2013: S/p XRT  ????  05/17/2013 - 07/30/2013: Anastrozole, stopped due to joint pains??  08/06/2013 - 09/30/2013: Exemestane stopped due joint pains  07/10/2013: Zometa q 6 months x 3 years, stopped   09/30/2013 - 03/08/2014: Tamoxifen, stopped 03/08/14 due to joint pains  03/23/2014 - 09/18/2014: Letrozole, stopped due to joint pains  10/04/2014 - 12/15/2014: Tamoxifen, stopped due to hot flashes and mood swings.  12/16/2014 - 01/30/2015: Anastrozole, stopped due to headache and dizziness  02/09/2015 started Toremefine  ??  Recent imaging-   RIGHT axilla ultrasound on 04/17/17-   Findings:  Real-time grayscale and color flow ultrasound was performed in the area of  concern in the right axilla.  ????  There is a knee elongated, heterogeneous, hyperechoic lesion in the right axilla  measuring 1.7 x 0.3 x 1.2 cm. This lesion is hyperechoic to the adjacent  subcutaneous fat and is superficially located within a millimeter of the skin  surface. No blood flow is seen within this lesion.  ????  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 Given that this lesion is  palpable, clinical follow-up is recommended.  ??  Breast MRI in 08/2016-   RIGHT BREAST:   Background parenchymal enhancement: Mild   Postsurgical changes in the right breast inferiorly. There is a subpectoral,  silicone implant.  ????  There are no suspicious internal mammary or axillary chain lymph nodes.  ??  LEFT BREAST:  Background parenchymal enhancement: None  The patient is status post mastectomy. There is a subpectoral, silicone implant.  ????  There are no suspicious internal mammary or axillary chain lymph nodes.  ??  IMPRESSION:  RIGHT BREAST:  1. BI-RADS Category 2 - Benign findings.   ??  LEFT BREAST:  1. BI-RADS Category 2 - Benign findings.     Past Medical History:   Diagnosis Date   ??? Anxiety    ??? Arthritis    ??? Cancer ([place])     left breast  cancer   ??? Chronic tension headaches    ??? Concussion 2011   ??? Headache(784.0)     OCCAS   ??? Ill-defined condition     cellulitis L UE   ??? Lymphedema of arm     left   ??? Nausea & vomiting    ??? Other and unspecified symptoms and signs involving general sensations and perceptions     HIVES RECENTLY- WAS IN ER Feb 19, 2013   ??? Other ill-defined conditions(799.89)     PHEOCHROMOCYTOMA       Past Surgical History:   Procedure Laterality Date   ??? ABDOMEN SURGERY PROC UNLISTED  hernia mesh repair   ??? ABDOMEN SURGERY PROC UNLISTED      right adenal removed   ??? BREAST SURGERY PROCEDURE UNLISTED  '91    breast implants; replaced bilat implants 2011   ??? HX APPENDECTOMY     ??? HX BREAST AUGMENTATION Left 04/22/2014    REMOVAL OF LEFT BREAST IMPLANT, WASHOUT AND CLOSURE OF LEFT BREAST WOUND performed by Sharlotte Alamo, MD at [facility]   ??? HX BREAST RECONSTRUCTION  11/28/2012    BREAST RECONSTRUCTION /C  INSERTION EXPANDER & ALLODERM performed by Sharlotte Alamo, MD at [facility]   ??? HX BREAST RECONSTRUCTION Bilateral 09/16/2013    REMOVAL AND REPLACEMENT LEFT BREAST IMPLANT TISSUE EXPANDERS WITH SUBTOTAL CAPSULECTOMY, RIGHT BREAST IMPLANT REMOVAL AND REPLACEMENT FOR SYMMETRY/FAT GRAFTING TO BILATERAL BREASTS performed by Sharlotte Alamo, MD at [facility]    ??? HX GYN      c-section   ??? HX HEENT      T&A   ??? HX HEENT      right adrenal removed   ??? HX MASTECTOMY Left    ??? HX MODIFIED RADICAL MASTECTOMY  11/28/2012    LEFT BREAST MODIFIED RADICAL MASTECTOMY WITH AXILLARY DISSECTION W/ RECONSTRUCTION LEFT BREAST WITH TISSUE EXPANDER AND ALLODERM;PORTACATH INSERTION performed by Ty Hilts, MD at [facility]   ??? HX OOPHORECTOMY      right   ??? HX ORTHOPAEDIC      bilateral shoulder arthroscopy   ??? HX VASCULAR ACCESS      PORT, THEN REMOVED       Social History     Social History   ??? Marital status: SINGLE     Spouse name: N/A   ??? Number of children: N/A   ??? Years of education: N/A     Occupational History   ??? Not on file.     Social History Main Topics   ??? Smoking status: Never Smoker   ??? Smokeless tobacco: Never Used   ??? Alcohol use No   ??? Drug use: No   ??? Sexual activity: Not on file     Other Topics Concern   ??? Not on file     Social History Narrative       Current Outpatient Prescriptions on File Prior to Visit   Medication Sig Dispense Refill   ??? toremifene (FARESTON) 60 mg tab TAKE 60 MG BY MOUTH DAILY. 30 Tab 5   ??? sertraline (ZOLOFT) 100 mg tablet TAKE 1 TAB BY MOUTH DAILY. 30 Tab 3   ??? BOTOX 200 unit injection      ??? zolpidem (AMBIEN) 10 mg tablet TAKE 1 TABLET BY MOUTH AT BEDTIME  3   ??? ondansetron (ZOFRAN ODT) 4 mg disintegrating tablet Take 1 Tab by mouth every eight (8) hours as needed for Nausea. 12 Tab 1   ??? codeine-butalbital-acetaminophen-caffeine (FIORICET WITH CODEINE) 50-325-40-30 mg per capsule      ??? valACYclovir (VALTREX) 1 gram tablet Take 2 tabs by mouth twice a day   Indications: PREVENTION OF HERPES ZOSTER IN IMMUNOCOMPROMISED PATIENT 120 Tab 5   ??? oxyCODONE IR (ROXICODONE) 5 mg immediate release tablet Take 5 mg by mouth every four (4) hours as needed for Pain.       No current facility-administered medications on file prior to visit.        Allergies   Allergen Reactions   ??? Aspirin Unknown (comments)     Gi bleeding    ???  Compazine [Prochlorperazine] Rash   ??? Keflex [Cephalexin] Rash   ??? Nsaids (Non-Steroidal Anti-Inflammatory Drug) Swelling     Throat swells       OB History     Obstetric Comments    Menarche:  16.   LMP: 33.  # of Children:  2.  Age at Delivery of First Child:  1.   Hysterectomy/oophorectomy:  NO/NO.  Breast Bx:  no.  Hx of Breast Feeding:  yes.  BCP:  no. Hormone therapy:  Yes '98 to 12/13.        ROS    Physical Exam   Cardiovascular: Normal rate and normal heart sounds.    Pulmonary/Chest: Breath sounds normal.         Right breast exhibits mass. Right breast exhibits no inverted nipple, no nipple discharge, no skin change and no tenderness. Left breast exhibits mass. Left breast exhibits no inverted nipple, no nipple discharge, no skin change and no tenderness. Breasts are symmetrical.       Lymphadenopathy:        Right cervical: No superficial cervical, no deep cervical and no posterior cervical adenopathy present.       Left cervical: No superficial cervical, no deep cervical and no posterior cervical adenopathy present.        Right axillary: No pectoral and no lateral adenopathy present.        Left axillary: No pectoral and no lateral adenopathy present.    BREAST ULTRASOUND  Indication: Left breast mass UOQ  Technique:  The area was scanned using a high-frequency linear-array near-field transducer  Findings: 2 hypoechoic masses, 1/0cm and 1.6cm  Impression: Indeterminate masses  Disposition: Ultrasound-guided biopsy performed    ASSESSMENT and PLAN    ICD-10-CM ICD-9-CM    1. Malignant neoplasm of lower-inner quadrant of left breast in female, estrogen receptor positive (HCC) C50.312 174.3     Z17.0 V86.0    2. Breast mass, left N63.20 611.72    3. Arm mass, right R22.31 782.2    4. Mass on back R22.2 782.2       Patient with hx of LEFT breast IDC presents for evaluation of enlarging RIGHT mass under RIGHT arm, which is bothersome when lifting her arm. She  also notes possible lipoma on her back which she feels is "attached" to her spine.    Probable lipomas at back and under RIGHT arm. Pt has difficulty with BL shoulder ROM on exam today. Reviewed that mass under RIGHT arm was consistent with lipoma on recent US, and is probably not related to shoulder pain and difficulty with ROM. Will refer to PT for further assistance with BL arm ROM.    Masses at LEFT breast UOQ on exam. [place] visualizes 2 hypoechoic masses at UOQ of reconstructed LEFT breast. Reviewed MRI with Dr. Deirdre Pippins and he feels these may have been present on prior images and recommends followup MRI to assess. Will schedule for November. This plan was reviewed with the patient and patient agrees. All questions were answered.    Written by Hart Robinsons, [employer], as dictated by Dr. Sandford Craze, MD.

## 2017-06-24 ENCOUNTER — Encounter

## 2017-06-26 MED ORDER — FARESTON 60 MG TABLET
60 mg | ORAL_TABLET | ORAL | 5 refills | Status: DC
Start: 2017-06-26 — End: 2018-05-18

## 2017-06-30 ENCOUNTER — Encounter: Payer: BLUE CROSS/BLUE SHIELD | Primary: Family Medicine

## 2017-06-30 ENCOUNTER — Ambulatory Visit: Payer: BLUE CROSS/BLUE SHIELD | Primary: Family Medicine

## 2017-07-03 ENCOUNTER — Encounter: Payer: BLUE CROSS/BLUE SHIELD | Attending: Rehabilitative and Restorative Service Providers" | Primary: Family Medicine

## 2017-07-04 NOTE — Telephone Encounter (Addendum)
Received a call from the patient and verified ID x 2.  The patient stated that she saw Dr. Drucilla Schmidt because she noticed three lumps on her mastectomy side and Dr. Drucilla Schmidt ordered a MRI of the breast and the patient inquired when her last breast MRI was.  Informed the patient that her last breast MRI was on 08/22/16 and that insurance companies usually require at least a year's time before the next one can be completed.  The patient inquired if her insurance may require one year and a day and informed the patient that they may however it is the concierge team that would authorize her breast MRI and if there are any issues they would contact her.  The patient requested for the number to concierge.  Provided the patient with the requested number.  The patient verbalized understanding and denied any further questions or concerns.

## 2017-07-06 ENCOUNTER — Encounter: Payer: BLUE CROSS/BLUE SHIELD | Attending: Rehabilitative and Restorative Service Providers" | Primary: Family Medicine

## 2017-07-10 ENCOUNTER — Encounter: Primary: Family Medicine

## 2017-07-13 ENCOUNTER — Encounter

## 2017-07-13 NOTE — Progress Notes (Signed)
Per [name], placed referral for the following:  Orders Placed This Encounter   ??? REFERRAL TO [facility]     Referral Priority:   Routine     Referral Type:   Consultation     Referral Reason:   Specialty Services Required

## 2017-07-14 ENCOUNTER — Inpatient Hospital Stay: Admit: 2017-07-14 | Payer: BLUE CROSS/BLUE SHIELD | Primary: Family Medicine

## 2017-07-14 DIAGNOSIS — I890 Lymphedema, not elsewhere classified: Secondary | ICD-10-CM

## 2017-07-14 NOTE — Progress Notes (Signed)
[address]  [place]  [address]      EVALUATION    NAME: [place] [name] AGE: 66 y.o.  GENDER: female  DATE: 07/14/2017  REFERRING PHYSICIAN:  Michaele Offer, MD  HISTORY AND BACKGROUND:  Pt is a 66 y/o female returns for evaluation of L UE lymphedema.  Pt states she is continuing with wear of day/night time compression garment, reporting day/night time compression garments in need of replacement.  Pt reports 2 episodes of cellulitis, both treated with oral antibiotics, since May.  Most recently infection onset 07/01/2017, with Augmentin completed today.     Breast cancer history at time of initial evaluation as follows:    Pt found to have 2.6 cm LN L axilla 09/24/2012.  Underwent biopsy on 10/02/2012 with findings of metastatic neoplasm.  MRI on 10/05/2012 revealed L breast mass in addition to lymph node.  Biopsy of breast mass on 10/16/2012 showed poorly differentiated carcinoma.  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  Pt completed chemotherapy and XRT.  Pt received care to address L UE lymphedema at [facility], prior to coming to this clinic. Pt presented at initial eval with pain L axilla, reporting "fluid pocket" in region, severe pain with shoulder movement, and feeling of "fullness" L upper arm. Pt stated symptoms presented post surgery.  Pt reports bilat shoulder dysfunction prior to cancer diagnosis, having undergone R rotator cuff repair 7 years ago, L rotator cuff repair 3 years ago. Pt is s/p bilateral breast reconstruction 09/16/2013.  States additional reconstructive surgery scheduled for 01/30/2014.  Pt underwent multiple surgeries for  reconstruction bilateral breasts, reduction with implant placement R breast, small implant placement with fat grafting L breast.  Pt states continued concerns regarding healing L implant was tenuous, and she has refrained from use of vasopneumatic pump to address lymphedema per Dr. Benjaman Lobe order.  Pt reports she has been compliant with day/night time compression garment wear L UE, as instructed.    Primary Diagnosis:  ?? 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 Malignant neoplasm of breast (C50.2)  Date of Onset: 11/29/2011  Present Symptoms and Functional Limitations: Pain L axilla/upper arm/upper quadrant anterior/lateral aspect.  L UE lymphedema.  LLIS lymphedema outcomes measure:  37/72; 54% impairment.  Past Medical History:   Past Medical History:   Diagnosis Date   ??? Anxiety    ??? Arthritis    ??? Cancer ([place])     left breast  cancer   ??? Chronic tension headaches    ??? Concussion 2011   ??? Headache(784.0)     OCCAS   ??? Ill-defined condition     cellulitis L UE   ??? Lymphedema of arm     left   ??? Nausea & vomiting    ??? Other and unspecified symptoms and signs involving general sensations and perceptions     HIVES RECENTLY- WAS IN ER Feb 19, 2013   ??? Other ill-defined conditions(799.89)     PHEOCHROMOCYTOMA     Past Surgical History:   Procedure Laterality Date   ??? ABDOMEN SURGERY PROC UNLISTED      hernia mesh repair   ??? ABDOMEN SURGERY PROC UNLISTED  right adenal removed   ??? BREAST SURGERY PROCEDURE UNLISTED  '91    breast implants; replaced bilat implants 2011   ??? HX APPENDECTOMY     ??? HX BREAST AUGMENTATION Left 04/22/2014    REMOVAL OF LEFT BREAST IMPLANT, WASHOUT AND CLOSURE OF LEFT BREAST WOUND performed by Sharlotte Alamo, MD at [place]   ??? HX BREAST RECONSTRUCTION  11/28/2012    BREAST RECONSTRUCTION /C  INSERTION EXPANDER & ALLODERM performed by Sharlotte Alamo, MD at [place]   ??? HX BREAST RECONSTRUCTION Bilateral 09/16/2013     REMOVAL AND REPLACEMENT LEFT BREAST IMPLANT TISSUE EXPANDERS WITH SUBTOTAL CAPSULECTOMY, RIGHT BREAST IMPLANT REMOVAL AND REPLACEMENT FOR SYMMETRY/FAT GRAFTING TO BILATERAL BREASTS performed by Sharlotte Alamo, MD at [place]   ??? HX GYN      c-section   ??? HX HEENT      T&A   ??? HX HEENT      right adrenal removed   ??? HX MASTECTOMY Left    ??? HX MODIFIED RADICAL MASTECTOMY  11/28/2012    LEFT BREAST MODIFIED RADICAL MASTECTOMY WITH AXILLARY DISSECTION W/ RECONSTRUCTION LEFT BREAST WITH TISSUE EXPANDER AND ALLODERM;PORTACATH INSERTION performed by Ty Hilts, MD at [place]   ??? HX OOPHORECTOMY      right   ??? HX ORTHOPAEDIC      bilateral shoulder arthroscopy   ??? HX VASCULAR ACCESS      PORT, THEN REMOVED     Current Medications:    Current Outpatient Prescriptions   Medication Sig   ??? FARESTON 60 mg tab TAKE 60 MG BY MOUTH DAILY.   ??? toremifene (FARESTON) 60 mg tab TAKE 60 MG BY MOUTH DAILY.   ??? zolpidem (AMBIEN) 10 mg tablet TAKE 1 TABLET BY MOUTH AT BEDTIME   ??? ondansetron (ZOFRAN ODT) 4 mg disintegrating tablet Take 1 Tab by mouth every eight (8) hours as needed for Nausea.   ??? codeine-butalbital-acetaminophen-caffeine (FIORICET WITH CODEINE) 50-325-40-30 mg per capsule    ??? valACYclovir (VALTREX) 1 gram tablet Take 2 tabs by mouth twice a day   Indications: PREVENTION OF HERPES ZOSTER IN IMMUNOCOMPROMISED PATIENT   ??? oxyCODONE IR (ROXICODONE) 5 mg immediate release tablet Take 5 mg by mouth every four (4) hours as needed for Pain.   ??? sertraline (ZOLOFT) 100 mg tablet TAKE 1 TAB BY MOUTH DAILY.   ??? BOTOX 200 unit injection      No current facility-administered medications for this encounter.      Allergies:   Allergies   Allergen Reactions   ??? Aspirin Unknown (comments)     Gi bleeding   ??? Compazine [Prochlorperazine] Rash   ??? Keflex [Cephalexin] Rash   ??? Nsaids (Non-Steroidal Anti-Inflammatory Drug) Swelling     Throat swells       Social/Work History and Prior Level of Function: Pt lives [place] the [place] end, spending some time in [place].  Pt has a 76 year old son which she and her partner care for.   Living Situation: Primary residence [place].     Trainable Caregiver?: Pt's significant other is an Conservation officer, historic buildings.  Available to assist pt as needed.    Self-care/ADLs: performing mod I, additional time, UE dressing deliberately to avoid pain L shoulder.     Mobility: indep transfers, bed mobility, gait in clinic.   Sleeping Arrangement:  Bed.   Adaptive Equipment Owned: na  Previous Therapy:  Pt received therapy for  lymphedema management at [facility] prior to coming to this clinic. 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  Pt experienced progression of stage 0 lymphedema to stage II lymphedema L UE end of May, with increase in limb volume beginning of June 2015.  Phase I CDT initiated at that time including application of compression bandaging. Custom flat knit compression garments for daytime use and Solaris Tribute for night time use were fit upon discontinuing compression bandaging.  Currently wearing ready made daytime compression sleeve, CCL 1 with effective management of lymphedema, continuing with night time wear of Tribute--pt reporting poor fit and in need of replacement.   Compression/Lymphedema Equipment:  Custom flat knit compression sleeve and custom glove, 2 sets, most recently received after appt at this clinic 2017.  Pt has  Solaris Tribute arm sleeve in need of replacement; Lymphapress Comfy sleeve pump, which she was instructed by Dr. Verl Blalock to avoid using.  Pt wearing custom flat knit 3021 compression sleeve to appt today.    SUBJECTIVE:   Pt arrives today with custom daytime compression sleeve donned upon arrival.  Pt continues with night time wear of Solaris Tribute, which is  in need of replacement. Patient has completed oral antibiotic to address cellulitis.  States she has plan to initiate PT at [facility]  Patient???s goals for therapy: Lymphedema management  EVALUATION AND OBJECTIVE DATA SUMMARY:   Pain: no L UE pain reported.  Pain Scale 1: Numeric (0 - 10)  Pain Intensity 1: 5  Pain Location 1: Arm, Hand, Shoulder  Skin and Tissue Assessment:  Dermal Status:  ( x)  Intact: L UE ( )  Dry   ( )  Tenuous:  ( )  Flaky   ( )  Wound/lesion present     ( )  Dermatitis    Texture/Consistency:  (x )  Boggy:  Forearm most problematic area at this time. ( )  Pitting Edema   ( )  Brawny (x )  Implants: bilateral breast   ( x)  Fibrotic/Woody:  Forearm arm.    Pigmentation/Color Change:  ( )  Normal:   ( )  Hemosiderin   ( )  Red:  ( )  Erythematous   (x )  Hyperpigmented:  Dorsal aspect L hand, yellowish in color. ( )  Hyperlipodermatosclerosis   Anomalies:    (x)  Axillary web syndrome:  Note pect tightness L, mild AWS in comparison to when previously seen, with limitations in improvement due to severe arthritic changes bilateral shoulder joint, present prior to breast cancer. ( )  Vesicles   ( )  Petechiae ( )  Warty Vercusis   ( )  Bullae ( )  Papilloma     Nails:  (x )  Normal  ( )  Fungus  Stemmers Sign: negative  Volumetric Measurements:   Right: 1474.98 mL Left:  5956.66 mL   % Difference:9.74% Dominance: Right   (See scanned graph)  Posture:  forward head/shoulder posture noted.                LUE AROM  L Shoulder Flexion: 82  L Shoulder ABduction: 70  L Shoulder Internal Rotation: 60  L Shoulder External Rotation: 20  L Elbow Flexion: 120  L Elbow Extension: 0  L Wrist Flexion: 75  L Wrist Extension: 70        RUE AROM  R Shoulder Flexion: 75  R Shoulder  ABduction: 60  R Shoulder Internal Rotation: 60  R Shoulder External Rotation: 20  R Elbow Flexion: 720  R Elbow Extension: 0  R Wrist Flexion: 70  R Wrist Extension: 70        RUE Strength  R Shoulder Flexion: 3+   R Shoulder ABduction: 3+  R Elbow Flexion: 4  R Elbow Extension: 4     LUE Strength  L Shoulder Flexion: 3  L Shoulder ABduction: 3  L Elbow Flexion: 4-  L Elbow Extension: 4-     bilateral grasp: 20 pounds with dynamometer    Mobility:  Bed/Chair Mobility:  indepTransfers:  indep  Sitting Balance:  goodStanding Balance:  good  Gait:  indepWheelchair Mobility:  na  Endurance:  intact for current level of activity, including gait community distances, working abbreviated schedule.Stairs:  Not assessed.    Safety:  Patient is alert and oriented:  x4   Safety awareness:  intact   Fall Risk?:  Low, use of pain meds evening hours.      Precautions:  Standard lymphedema precautions to include avoiding blood pressure readings, injections and IVs or other procedures/acts that could lead to broken skin on affected area, and avoiding excessive heat, resistive activity or altitude without compression garment    Evaluation Time: 9:30-9:58 am 28 minutes    TREATMENT PROVIDED:   1.  Treatment description:  The patient was educated regarding the role and function of the lymphatic system, and instructed in the lymphedema management protocol of complete decongestive therapy (CDT).  This includes skin care to prevent breakdown or infection, lymphedema exercises, custom compression therapy options (bandaging, compression garments, compression pump, Lenore Cordia, [employer], [employer], etc. as needed), and decongestion with manual lymphatic drainage as indicated.  We discussed the need for consistent compression system for lymphedema management, with Solaris tribute night time garment brought by pt to clinic for assessment secondary to poor fit and concerns regarding irritation at thumb and between fingers with wear.  No s/s of infection noted, however, patient reports 2 episodes of L UE cellulitis since last seen a year ago.  Pt advised that, in the presence of active infection, she needs to  discontinue compression garment wear, launder all garments, resuming wear of garment upon completion of oral antibiotic and resolution of s/s of infection.  Completed measurements for day/night time compression garments, to be fit in clinic upon receiving for fit.  Patient to continue wear of garments currently owned as noted below.  Compression garment routine:  1.  Apply daytime compression garments to clean, dry extremity first thing in the morning, EVERY MORNING.    2. Wear garment until bedtime, adjusting throughout the day as needed should garment wrinkle or crease.  Problem areas can be at joint, and top of garment, ensuring proximal aspect of garment is positioned appropriately on extremity.  3. Launder compression garments nightly either by hand or washing machine in a garment bag or pillowcase.  Use mild detergent with NO FABRIC SOFTENER, NO BLEACH, NO WOOLITE.  Wash in cool/warm water.    4. Dry garment in dryer on low heat right side out in garment bag or pillowcase.  5. Daytime compression garments are NOT safe to sleep in, so remove at bedtime.    6. Perform skin care to extremity, cleansing skin daily with soap and water, and using low pH lotion, upward strokes to stimulate lymphatic vessels.  Perform self MLD.  7. Apply night time compression garment as instructed to wear while  sleeping, adjusting throughout the night as needed for comfort.  8. Perform exercise program with daytime compression stockings on.  Signs/Symptoms of infection include:  Fever, flu-like symptoms, pain/redness/warmth in extremity, sometimes with increase in swelling present.  Stop wearing your compression garment if this occurs.  Call your doctor immediately or go to the Emergency room to address potential infection.  Remove compression with changes in circulation or numbness in extremity, different from what you may experience when not wearing compression garment, pain, unexplained shortness of breath.       Treatment time:  10:20-11:06 am   46 minutes     Manual therapy 3         ASSESSMENT:   [place] [name] is a 66 y.o. female who presents with stage 2 lymphedema L UE, with forearm mildly boggy, however, patient managing with wear of RM compression sleeve during the day, and Solaris Tribute wear at night.  Limb volume discrepancy 9.74%, increased from 0.45% on 08/05/2016.  Pt has had 2 episodes of celluitis since last seen, both treated with oral antibiotics, most recent episode 07/01/2017, with Augmentin completed.  Solaris Tribute worn, with poor fit, and in need of replacement, with garment order placed, and custom flat knit daytime garments in need of replacement.  Pt has remained compliant with lymphedema management, with recent cellulitis episodes unexpected.  Pt continues with bilat shoulder dysfunction, orthopedic in nature, which was present prior to breast cancer diagnosis and treatment, with previous attempts to treat in this clinic and other outpatient ortho clinic resulting in severe increase in pain.  Home management of lymphedema concerning compression garment wear to be continued at this time as noted above, with pt to fit new Solaris Tribute night time garment and daytime garments in clinic upon receiving, with noted adjustments to garment above, measurements scanned into chart.    This care is medically necessary due to the infection risk with lymphedema, and to improve functional activities.  CDT is necessary to resolve swelling to allow patient to return to wearing normal clothes/footwear, and prevent worsening of symptoms, such as venous stasis ulcerations, infections, or hospitalizations.  Patient will be independent with home program strategies to allow improved ADL ability and mobility and to allow patient to return to greatest functional independence.    Pt to call clinic with any questions or concerns regarding garment fit.  Pt will benefit from 2-5 prn PT visits over the next 6-12 weeks to  establish effective and comfortable fit of night time compression garment, and to order daytime compression garments as needed based on age and wear of current garment.     PLAN OF CARE:  [employer] upon receiving.  Request any needed alteration.  Order replacement daytime garments as indicated.   Goals:  1.  Establish effective and comfortable fit of replacement Solaris Tribute compression garment, with pt able to don/doff indep.    2.  Order any needed replacement daytime compression garments.    Patient has participated in goal setting and agrees to work toward plan of care.  Patient was instructed to call if questions or concerns arise.    Thank you for this referral.  Kalman Shan, PT, CLT    Time Calculation: 85 mins    TREATMENT PLAN EFFECTIVE DATES:   07/14/2017-10/02/2017  I have read the above plan of care for [facility].  I certify the above prescribed services are required by this patient and are medically necessary.  The above plan of care  has been developed in conjunction with the lymphedema/physical therapist.       Physician Signature: ____________________________________Date:______________

## 2017-07-25 ENCOUNTER — Encounter: Payer: BLUE CROSS/BLUE SHIELD | Attending: Rehabilitative and Restorative Service Providers" | Primary: Family Medicine

## 2017-07-28 ENCOUNTER — Inpatient Hospital Stay: Payer: BLUE CROSS/BLUE SHIELD | Primary: Family Medicine

## 2017-08-03 NOTE — Telephone Encounter (Signed)
Patient called and stated that she needed a prescription sent to [facility] for bras and prosthesis. Patient would like the prescription to be "for life." Fax number is [phone] CB# for patient is [phone]

## 2017-08-07 ENCOUNTER — Encounter

## 2017-08-07 MED ORDER — OTHER
3 refills | Status: DC
Start: 2017-08-07 — End: 2018-03-28

## 2017-08-07 NOTE — Telephone Encounter (Signed)
Per Henriette Combs, NP a prescription for    Requested Prescriptions     Signed Prescriptions Disp Refills   ??? OTHER 6 Each 3     Sig: Mastectomy bra and prosethesis    Dx: N02.725 left breast cancer     Authorizing Provider: Willodean Rosenthal     Ordering User: Curly Rim F     was placed.

## 2017-08-08 NOTE — Telephone Encounter (Signed)
08/08/17 9:32 PM: Called patient and verified ID x 2. Advised her that a prescription for mastectomy bras and prosthesis was faxed this morning to [employer]. Patient asked if the prescription was "good for life" or yearly. Advised her that the prescription is for a year. Patient asked if it could be changed to "good for life" and stated that other patients had said theirs was good for that long. Advised patient that I will speak to our nurse practitioner and find out if that is a possibility. Patient verbalized understanding and stated she would check with [facility] to make sure they received the fax. Prescription was then shredded and discarded.

## 2017-08-14 ENCOUNTER — Ambulatory Visit
Admit: 2017-08-14 | Discharge: 2017-08-14 | Payer: PRIVATE HEALTH INSURANCE | Attending: Specialist | Primary: Family Medicine

## 2017-08-14 DIAGNOSIS — Z170 Estrogen receptor positive status [ER+]: Secondary | ICD-10-CM

## 2017-08-14 NOTE — Progress Notes (Signed)
[place] [name] is a 66 y.o. female here for follow-up of breast cancer.

## 2017-08-14 NOTE — Progress Notes (Signed)
[facility]  [address], [place]   [address]  W: [phone]   F: [phone]      f/u HEME/ONC CONSULT    Reason for visit: evaluation for treatment for  Breast cancer    Consulting physician:  Dr. Gilford Rile, Dr. Raynald Blend, Brooke Bonito.  Ref physician:  Dr. Drucilla Schmidt    HPI:   Emily [name] is a 66 y.o.  female who I am seeing for management of breast cancer        She had a screening ultrasound on 09/24/12 showing a 2.6 cm left LN.  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  HER 2 negative IHC at 0.    She has a history of a pheochromocytoma surgically removed in 1997.    She then had a MRI breast on 10/05/12 showing the 2.8 x 2 cm LN as well as a left breast LIQ 0.9 cm x 1.1 cm x 0.6 cm mass.  Biopsy of that mass by FNA on 10/16/12 shows a poorly differentiated carcinoma.  These slides were air-dried, HER 2 not able to be performed on them.    Mammaprint of LN shows high risk luminal, ER + at 0.19, PR + at 0.61, HER 2 negative at -0.69.    She underwent L mastectomy on 11/28/12 showing IDC with neuroendocrine features, gr 2, 0.5 cm, DCIS gr 2-3, + LVI, 1/14 LN + with a 1 cm met, no ECE.  HER 2 negative (ratio 1.2, sig/cell 2.8), ER + 95%, PR + 5%; ki67 5%; DCIS ER + 95%, PR + 1%.    S/p TC q 3 weeks x 4 from 12/25/12-02/26/13 (75 mg/m2; 600 mg/m2)    S/p XRT from 04/02/13-05/10/13    Started anastrozole 05/17/13, stop 07/30/13 (joint pains)    exemestane 08/06/13-09/30/13 (joint pains)    Started zometa 07/10/13, q 6 months x 3 years, stopped     Started tamoxifen 09/30/13, stopped on 03/08/14 due to joint pains    Started letrozole 03/23/14, stopped 09/18/14 due to joint pains     Started tamoxifen 10/04/14, stopped on 12/15/14 due to hot flashes and mood swings.    Anastrozole 12/16/14-01/30/15, stopped due to headache and dizziness    Toremefine 02/09/15-    Interval History: In today for follow up.  Complains of gr 1 constipation, gr 1 fatigue, gr 2 insomnia, 5/10 chronic joint pain, gr 2 headaches, gr 1 vaginal dryness    Headaches are worse    Monitoring L breast mass, reviewed Dr. Arlyss Queen notes    LMP 1996.    DX   Encounter Diagnosis   Name Primary?   ??? Malignant neoplasm of lower-inner quadrant of left breast in female, estrogen receptor positive ([place]) Yes      Past Medical History:   Diagnosis Date   ??? Anxiety    ??? Arthritis    ??? Cancer ([place])     left breast  cancer   ??? Chronic tension headaches    ??? Concussion 2011   ??? Headache(784.0)     OCCAS   ??? Ill-defined condition     cellulitis L UE   ??? Lymphedema of arm     left   ??? Nausea & vomiting    ???  Other and unspecified symptoms and signs involving general sensations and perceptions     HIVES RECENTLY- WAS IN ER Feb 19, 2013   ??? Other ill-defined conditions(799.89)     PHEOCHROMOCYTOMA     Past Surgical History:   Procedure Laterality Date   ??? ABDOMEN SURGERY PROC UNLISTED      hernia mesh repair   ??? ABDOMEN SURGERY PROC UNLISTED      right adenal removed   ??? BREAST SURGERY PROCEDURE UNLISTED  '91    breast implants; replaced bilat implants 2011   ??? HX APPENDECTOMY     ??? HX GYN      c-section   ??? HX HEENT      T&A   ??? HX HEENT      right adrenal removed   ??? HX MASTECTOMY Left    ??? HX OOPHORECTOMY      right   ??? HX ORTHOPAEDIC      bilateral shoulder arthroscopy   ??? HX VASCULAR ACCESS      PORT, THEN REMOVED     Social History     Socioeconomic History   ??? Marital status: SINGLE     Spouse name: Not on file   ??? Number of children: Not on file   ??? Years of education: Not on file   ??? Highest education level: Not on file   Social Needs   ??? Financial resource strain: Not on file   ??? Food insecurity - worry: Not on file    ??? Food insecurity - inability: Not on file   ??? Transportation needs - medical: Not on file   ??? Transportation needs - non-medical: Not on file   Occupational History   ??? Not on file   Tobacco Use   ??? Smoking status: Never Smoker   ??? Smokeless tobacco: Never Used   Substance and Sexual Activity   ??? Alcohol use: No   ??? Drug use: No   ??? Sexual activity: Not on file   Other Topics Concern   ??? Not on file   Social History Narrative   ??? Not on file     Family History   Problem Relation Age of Onset   ??? Alzheimer Mother    ??? Diabetes Mother    ??? Other Mother         COMBATIVE POST ANESTHESIA   ??? Heart Disease Father    ??? Diabetes Father    ??? Lung Disease Father         COPD   ??? Headache Sister    ??? Migraines Sister    ??? Anesth Problems Neg Hx        Current Outpatient Medications   Medication Sig Dispense Refill   ??? OTHER Mastectomy bra and prosethesis    Dx: Q59.563 left breast cancer 6 Each 3   ??? FARESTON 60 mg tab TAKE 60 MG BY MOUTH DAILY. 30 Tab 5   ??? valACYclovir (VALTREX) 1 gram tablet Take 2 tabs by mouth twice a day   Indications: PREVENTION OF HERPES ZOSTER IN IMMUNOCOMPROMISED PATIENT 120 Tab 5   ??? sertraline (ZOLOFT) 100 mg tablet TAKE 1 TAB BY MOUTH DAILY. 30 Tab 3   ??? BOTOX 200 unit injection      ??? zolpidem (AMBIEN) 10 mg tablet TAKE 1 TABLET BY MOUTH AT BEDTIME  3   ??? ondansetron (ZOFRAN ODT) 4 mg disintegrating tablet Take 1 Tab by mouth every eight (8) hours as needed for Nausea.  12 Tab 1   ??? codeine-butalbital-acetaminophen-caffeine (FIORICET WITH CODEINE) 50-325-40-30 mg per capsule      ??? oxyCODONE IR (ROXICODONE) 5 mg immediate release tablet Take 5 mg by mouth every four (4) hours as needed for Pain.         Allergies   Allergen Reactions   ??? Aspirin Unknown (comments)     Gi bleeding   ??? Compazine [Prochlorperazine] Rash   ??? Keflex [Cephalexin] Rash   ??? Nsaids (Non-Steroidal Anti-Inflammatory Drug) Swelling     Throat swells       Review of Systems     A comprehensive review of systems was performed and all systems were negative except for HPI and for the symptom report form, reviewed and scanned in.      Objective:Physical Exam:  Visit Vitals  BP 125/71   Pulse 66   Temp 98 ??F (36.7 ??C) (Temporal)   Resp 18   Ht $R'5\' 6"'mJ$  (1.676 m)   Wt 124 lb 6.4 oz (56.4 kg)   SpO2 97%   BMI 20.08 kg/m??       General:  Alert, cooperative, no distress, appears stated age.   Head:  Normocephalic, without obvious abnormality, atraumatic.   Eyes:  Conjunctivae/corneas clear. PERRL, EOMs intact.   Throat: Lips, mucosa, and tongue normal.    Neck: Supple, symmetrical, trachea midline, no adenopathy, thyroid: no enlargement/tenderness/nodules   Back:   Symmetric, no curvature. ROM normal. No CVA tenderness.   Lungs:   Clear to auscultation bilaterally.   Chest wall:  S/p bilateral mastectomy with reconstruction.    Heart:  Regular rate and rhythm, S1, S2 normal, no murmur, click, rub or gallop.   Abdomen:   Soft, non-tender. Bowel sounds normal. No masses,  No organomegaly.   Extremities:  Extremities normal, atraumatic, no cyanosis.   Skin: No rash   Lymph nodes: Cervical, supraclavicular, and axillary nodes normal.   Neurologic: CNII-XII intact.           Diagnostic Imaging   10/12/12 PET:  Negative for distant mets  10/22/12 brain MRI:  Negative    09/24/12 dexa L hip T -2.5; L spine T -2.3  osteoporosis    12/11/13 bilateral MRI  Negative    03/26/14 MRI Brain  Negative    12/05/14 MRI breast   Negative    12/05/14 [place] L breast?? ????  IMPRESSION:  1. Very small, hypoechoic lesion in continuity and immediately below the ??  dermis overlying the sternum/medialmost left breast. Finding suggests dermal ??  lesion such as epidermal inclusion cyst versus postoperative change.  BI-RADS Category 2 - Benign findings. ??    01/01/16 breast MRI  Negative    08/22/16 Breast MRI  Negative    04/17/17 [place] right ext  IMPRESSION  Impression:  Probable lipoma in the right axillary region. Given that this lesion is   palpable, clinical follow-up is recommended.       08/28/17 MRI scheduled    Lab Results  Lab Results   Component Value Date/Time    WBC 3.5 10/30/2015 09:13 AM    HGB 13.2 10/30/2015 09:13 AM    HCT 40.3 10/30/2015 09:13 AM    PLATELET 214 10/30/2015 09:13 AM    MCV 90 10/30/2015 09:13 AM       Lab Results   Component Value Date/Time    Sodium 140 10/30/2015 09:13 AM    Potassium 3.9 10/30/2015 09:13 AM    Chloride 100 10/30/2015 09:13 AM  CO2 25 10/30/2015 09:13 AM    Anion gap 10 02/05/2013 09:44 AM    Glucose 79 10/30/2015 09:13 AM    BUN 7 (L) 10/30/2015 09:13 AM    Creatinine 0.49 (L) 10/30/2015 09:13 AM    BUN/Creatinine ratio 14 10/30/2015 09:13 AM    GFR est AA 121 10/30/2015 09:13 AM    GFR est non-AA 105 10/30/2015 09:13 AM    Calcium 8.2 (L) 10/30/2015 09:13 AM    AST (SGOT) 25 10/30/2015 09:13 AM    Alk. phosphatase 49 10/30/2015 09:13 AM    Protein, total 6.9 10/30/2015 09:13 AM    Albumin 4.4 10/30/2015 09:13 AM    Globulin 2.4 02/05/2013 09:44 AM    A-G Ratio 1.8 10/30/2015 09:13 AM    ALT (SGPT) 18 10/30/2015 09:13 AM     Assessment/Plan:  66 y.o. female with a 0.5 cm left breast cancer, IDC with endocrine features, 1/14 LN +, gr 2, high risk mammaprint,  ER and PR +, HER 2 negative. PS 0    1. Breast cancer with neuroendocrine features, Left, lower inner quadrant, stage IIA, T1bN1aMx    With no further therapy, her RR is about 36%; endocrine therapy will decrease her RR to 19%; and 2nd gen chemo will decrease her RR to 12%; 3rd gen to 10%.  These #s may be higher with her high risk mammaprint.     Based on her pheo and now breast cancer history, I recommended genetic counseling -- referral to [facility] made at prior visit. Has seen them and they have performed BRCA testing -- negative.  [facility] has run a PGL panel on 09/10/14.    No evidence of recurrence, continue toremifene.   Will plan to stop 05/2018.      Last eye exam: 08/01/17  Last gyn exam: going 09/15/17, Dr. Melina Modena     Breast MRI 08/22/16 negative, next due 08/2017.  scheduled    2. Anxiety/Depression: Stable today; continue sertraline 100 mg daily. Valium prn.  Dr. Kalman Shan is managing her antidepressant    3. Osteoporosis: Had received zometa previously, which was given for one dose on 07/10/13. Switched to prolia 60 mg SC q 6 months, received 04/08/14,10/01/14, 03/10/15, 09/14/15. She had one remaining Prolia injection, but she had oral surgery in July, discontinued    DEXA 10/01/14 at the [facility], patient reports improvement. Repeat DEXA 10/07/16 shows severe osteopenia, but slight improvement    Currently taking Vit D3 2000 International Units daily.    4. Fibromyalgia/RA: Not currently on any treatment, previously discussed with patient that there are medical options to assist with her pain. She continues to follow up with her Rheumatologist every 6 weeks. She took MTX prior to cancer diagnosis which she did not tolerate.  Previously did not tolerate lyrica due to sedation.  She reports she has been receiving cortisone injections.     5. Headaches:  Has chronic headaches, uses botox, saw Dr. Rolm Bookbinder.  Worsening, will check brain MRI    6. Lipomas under right arm:  Monitoring    7. Left breast mass UOQ:  MRI in November; reviewed Dr. Arlyss Queen note; monitoring    > 25 minutes were spent with this patient with > 50% of that time spent in face to face counseling.        Follow-up Disposition:  Return in about 6 months (around 02/11/2018).    Sonda Rumble, MD

## 2017-08-25 ENCOUNTER — Ambulatory Visit: Payer: BLUE CROSS/BLUE SHIELD | Primary: Family Medicine

## 2017-08-28 ENCOUNTER — Ambulatory Visit: Payer: BLUE CROSS/BLUE SHIELD | Primary: Family Medicine

## 2017-08-29 ENCOUNTER — Encounter

## 2017-08-29 ENCOUNTER — Encounter: Primary: Family Medicine

## 2017-08-31 ENCOUNTER — Ambulatory Visit: Payer: BLUE CROSS/BLUE SHIELD | Primary: Family Medicine

## 2017-08-31 ENCOUNTER — Inpatient Hospital Stay: Admit: 2017-08-31 | Payer: BLUE CROSS/BLUE SHIELD | Attending: Surgery | Primary: Family Medicine

## 2017-08-31 DIAGNOSIS — Z853 Personal history of malignant neoplasm of breast: Secondary | ICD-10-CM

## 2017-08-31 MED ORDER — GADOBUTROL 7.5 MMOL/7.5 ML (1 MMOL/ML) IV
7.5 mmol/ mL (1 mmol/mL) | Freq: Once | INTRAVENOUS | Status: AC
Start: 2017-08-31 — End: 2017-08-31
  Administered 2017-08-31: 15:00:00 via INTRAVENOUS

## 2017-08-31 MED ORDER — GADOBUTROL 7.5 MMOL/7.5 ML (1 MMOL/ML) IV
7.5 mmol/ mL (1 mmol/mL) | Freq: Once | INTRAVENOUS | Status: DC
Start: 2017-08-31 — End: 2017-08-31

## 2017-08-31 MED FILL — GADAVIST 7.5 MMOL/7.5 ML (1 MMOL/ML) INTRAVENOUS SOLUTION: 7.5 mmol/ mL (1 mmol/mL) | INTRAVENOUS | Qty: 7.5

## 2017-08-31 MED FILL — GADAVIST 7.5 MMOL/7.5 ML (1 MMOL/ML) INTRAVENOUS SOLUTION: 7.5 mmol/ mL (1 mmol/mL) | INTRAVENOUS | Qty: 15

## 2017-09-01 ENCOUNTER — Encounter

## 2017-09-01 ENCOUNTER — Encounter: Primary: Family Medicine

## 2017-09-01 NOTE — Telephone Encounter (Signed)
L/M for patient that MRI "was good."     I asked her to call if she had any questions.

## 2017-09-11 ENCOUNTER — Encounter: Payer: BLUE CROSS/BLUE SHIELD | Primary: Family Medicine

## 2017-09-18 NOTE — Telephone Encounter (Addendum)
Dr. Verl Blalock removed the lump that the patient was feeling in her LEFT breast.  The area was 3 cm, and the patient said contained three nodules that were connected like a clover.  Pathology was "unspecified inflammation."  I will call Dr. Benjaman Lobe office tomorrow to get a copy of this path.  She does not understand why this did not show up on MRI.   I told her that I could not explain this.  I think she is just happy that this is all benign.

## 2017-09-20 ENCOUNTER — Ambulatory Visit: Payer: BLUE CROSS/BLUE SHIELD | Primary: Family Medicine

## 2017-09-22 NOTE — Telephone Encounter (Signed)
Pt called stating a prescription for a prothetic bra was submitted to her insurance but was denied because it needs pre auth or modifier code from our office. Pt is requesting for someone to call the Pre auth department at [phone]/ DOS- 09/01/17 Claim number [id number], [id number] is the procedure code. For questions please call the pt at [phone]

## 2017-09-25 NOTE — Telephone Encounter (Addendum)
09/25/17- Call placed to patient's insurance, [employer] Claims dept, regarding claim for prosthetic bras. Per representative at [facility], they will need the name of facility on the claim and amount of claim before they can locate in the system. Tried providing information patient provided, but this was not sufficient.     Called patient to inquire about facility name and amount. Patient stated that we sent the prescription to [facility] but she does not remember the exact claim amount and asked if I would call [place] to get this info.     Call placed to [facility] but their office is closed for the holidays. Will call them upon my return to the office on Thursday, 12/27.    10/09/2017- Call placed to [facility] to inquire about insurance denial for mastectomy bras patient ordered. Per [facility], their facility is assisting with denial.

## 2017-10-30 MED ORDER — GABAPENTIN 300 MG CAP
300 mg | ORAL_CAPSULE | ORAL | 1 refills | Status: DC
Start: 2017-10-30 — End: 2018-02-20

## 2017-12-21 NOTE — Telephone Encounter (Signed)
Patient called and would like a call back. Patient stated she had received injections in the past, and she is having severe bone and neck pain. Her physician is telling her that she should get the injections again. Patient is requesting a call to discuss this. CB # [phone]

## 2017-12-22 NOTE — Telephone Encounter (Addendum)
12/22/17 11:09 AM: Returned call to patient, who stated that she is having "debilitating neck pain." Patient saw her gynecologist yesterday and was told that her last DEXA scan showed osteoporosis. Patient is concerned that the pain is coming from osteoporosis in her neck and wanted to see if [name] would be agreeable to her receiving Prolia again. Patient stated that she is seeing her PCP on Monday to have labs drawn and to do a wellness exam; wanted to know if [name] would like to add on any labs to check. Advised patient that NP will be consulted and the patient will receive a call back.     12/22/17 11:47 AM: Called patient and advised that Julianne Rice, NP, recommends coming in for an office appointment to discuss options. Also advised that NP does not recommend adding on any other labs to her PCP visit on Monday. Patient was agreeable to an office visit; appointment scheduled for 12/26/17 at 8:30 AM, per patient request. Patient denied further questions or concerns at this time.

## 2017-12-26 ENCOUNTER — Ambulatory Visit
Admit: 2017-12-26 | Discharge: 2017-12-26 | Payer: PRIVATE HEALTH INSURANCE | Attending: Nurse Practitioner | Primary: Family Medicine

## 2017-12-26 DIAGNOSIS — Z170 Estrogen receptor positive status [ER+]: Secondary | ICD-10-CM

## 2017-12-26 NOTE — Progress Notes (Signed)
[place] [name] is a 66 y.o. female here for follow-up of breast cancer.

## 2017-12-26 NOTE — Progress Notes (Signed)
[facility]  [place], [place]   [address]  W: [phone]   F: [phone]      f/u HEME/ONC CONSULT    Reason for visit: evaluation for treatment for  Breast cancer    Consulting physician:  Dr. Gilford Rile, Dr. Raynald Blend, Brooke Bonito.  Ref physician:  Dr. Drucilla Schmidt    HPI:   Emily [name] is a 67 y.o.  female who I am seeing for management of breast cancer        She had a screening ultrasound on 09/24/12 showing a 2.6 cm left LN.  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  HER 2 negative IHC at 0.    She has a history of a pheochromocytoma surgically removed in 1997.    She then had a MRI breast on 10/05/12 showing the 2.8 x 2 cm LN as well as a left breast LIQ 0.9 cm x 1.1 cm x 0.6 cm mass.  Biopsy of that mass by FNA on 10/16/12 shows a poorly differentiated carcinoma.  These slides were air-dried, HER 2 not able to be performed on them.    Mammaprint of LN shows high risk luminal, ER + at 0.19, PR + at 0.61, HER 2 negative at -0.69.    She underwent L mastectomy on 11/28/12 showing IDC with neuroendocrine features, gr 2, 0.5 cm, DCIS gr 2-3, + LVI, 1/14 LN + with a 1 cm met, no ECE.  HER 2 negative (ratio 1.2, sig/cell 2.8), ER + 95%, PR + 5%; ki67 5%; DCIS ER + 95%, PR + 1%.    S/p TC q 3 weeks x 4 from 12/25/12-02/26/13 (75 mg/m2; 600 mg/m2)    S/p XRT from 04/02/13-05/10/13    Started anastrozole 05/17/13, stop 07/30/13 (joint pains)    exemestane 08/06/13-09/30/13 (joint pains)    Started zometa 07/10/13, q 6 months x 3 years, stopped     Started tamoxifen 09/30/13, stopped on 03/08/14 due to joint pains    Started letrozole 03/23/14, stopped 09/18/14 due to joint pains     Started tamoxifen 10/04/14, stopped on 12/15/14 due to hot flashes and mood swings.    Anastrozole 12/16/14-01/30/15, stopped due to headache and dizziness    Toremefine 02/09/15-    Interval History: In today for follow up.  Complains of gr 1 constipation, gr 2 insomnia, gr 2 pain 7-8/10 pain to neck, gr 2 headache, gr 1 libido, gr 1 vaginal dryness.    Neck pain is worse. Currently working with PT.     Monitoring L breast mass, reviewed Dr. Arlyss Queen notes.    She plans on moving to [place], recently bought house in December.     LMP 1996.    DX   Encounter Diagnoses   Name Primary?   ??? Malignant neoplasm of lower-inner quadrant of left breast in female, estrogen receptor positive ([place]) Yes   ??? At high risk for breast cancer       Past Medical History:   Diagnosis Date   ??? Anxiety    ??? Arthritis    ??? Cancer ([place])     left breast  cancer   ??? Chronic tension headaches    ??? Concussion 2011   ??? Headache(784.0)  OCCAS   ??? Ill-defined condition     cellulitis L UE   ??? Lymphedema of arm     left   ??? Nausea & vomiting    ??? Other and unspecified symptoms and signs involving general sensations and perceptions     HIVES RECENTLY- WAS IN ER Feb 19, 2013   ??? Other ill-defined conditions(799.89)     PHEOCHROMOCYTOMA     Past Surgical History:   Procedure Laterality Date   ??? ABDOMEN SURGERY PROC UNLISTED      hernia mesh repair   ??? ABDOMEN SURGERY PROC UNLISTED      right adenal removed   ??? BREAST SURGERY PROCEDURE UNLISTED  '91    breast implants; replaced bilat implants 2011   ??? HX APPENDECTOMY     ??? HX BREAST AUGMENTATION Left 04/22/2014    REMOVAL OF LEFT BREAST IMPLANT, WASHOUT AND CLOSURE OF LEFT BREAST WOUND performed by Sharlotte Alamo, MD at [place]   ??? HX BREAST RECONSTRUCTION  11/28/2012    BREAST RECONSTRUCTION /C  INSERTION EXPANDER & ALLODERM performed by Sharlotte Alamo, MD at [place]   ??? HX BREAST RECONSTRUCTION Bilateral 09/16/2013    REMOVAL AND REPLACEMENT LEFT BREAST IMPLANT TISSUE EXPANDERS WITH  SUBTOTAL CAPSULECTOMY, RIGHT BREAST IMPLANT REMOVAL AND REPLACEMENT FOR SYMMETRY/FAT GRAFTING TO BILATERAL BREASTS performed by Sharlotte Alamo, MD at [place]   ??? HX GYN      c-section   ??? HX HEENT      T&A   ??? HX HEENT      right adrenal removed   ??? HX MASTECTOMY Left    ??? HX MODIFIED RADICAL MASTECTOMY  11/28/2012    LEFT BREAST MODIFIED RADICAL MASTECTOMY WITH AXILLARY DISSECTION W/ RECONSTRUCTION LEFT BREAST WITH TISSUE EXPANDER AND ALLODERM;PORTACATH INSERTION performed by Ty Hilts, MD at [place]   ??? HX OOPHORECTOMY      right   ??? HX ORTHOPAEDIC      bilateral shoulder arthroscopy   ??? HX VASCULAR ACCESS      PORT, THEN REMOVED     Social History     Socioeconomic History   ??? Marital status: SINGLE     Spouse name: Not on file   ??? Number of children: Not on file   ??? Years of education: Not on file   ??? Highest education level: Not on file   Tobacco Use   ??? Smoking status: Never Smoker   ??? Smokeless tobacco: Never Used   Substance and Sexual Activity   ??? Alcohol use: No   ??? Drug use: No     Family History   Problem Relation Age of Onset   ??? Alzheimer Mother    ??? Diabetes Mother    ??? Other Mother         COMBATIVE POST ANESTHESIA   ??? Heart Disease Father    ??? Diabetes Father    ??? Lung Disease Father         COPD   ??? Headache Sister    ??? Migraines Sister    ??? Anesth Problems Neg Hx        Current Outpatient Medications   Medication Sig Dispense Refill   ??? diazePAM (VALIUM) 10 mg tablet TAKE 1/2 TO 1 TABLET BY MOUTH TWICE A DAY AS NEEDED FOR ANXIETY     ??? OTHER Mastectomy bra and prosethesis    Dx: O37.858 left breast cancer 6 Each 3   ???  FARESTON 60 mg tab TAKE 60 MG BY MOUTH DAILY. 30 Tab 5   ??? sertraline (ZOLOFT) 100 mg tablet TAKE 1 TAB BY MOUTH DAILY. 30 Tab 3   ??? BOTOX 200 unit injection      ??? gabapentin (NEURONTIN) 300 mg capsule TAKE ONE CAPSULE BY MOUTH EVERY EVENING AS NEEDED 30 Cap 1   ??? zolpidem (AMBIEN) 10 mg tablet TAKE 1 TABLET BY MOUTH AT BEDTIME  3    ??? ondansetron (ZOFRAN ODT) 4 mg disintegrating tablet Take 1 Tab by mouth every eight (8) hours as needed for Nausea. 12 Tab 1   ??? codeine-butalbital-acetaminophen-caffeine (FIORICET WITH CODEINE) 50-325-40-30 mg per capsule      ??? valACYclovir (VALTREX) 1 gram tablet Take 2 tabs by mouth twice a day   Indications: PREVENTION OF HERPES ZOSTER IN IMMUNOCOMPROMISED PATIENT 120 Tab 5   ??? oxyCODONE IR (ROXICODONE) 5 mg immediate release tablet Take 5 mg by mouth every four (4) hours as needed for Pain.         Allergies   Allergen Reactions   ??? Aspirin Unknown (comments)     Gi bleeding   ??? Compazine [Prochlorperazine] Rash   ??? Keflex [Cephalexin] Rash   ??? Nsaids (Non-Steroidal Anti-Inflammatory Drug) Swelling     Throat swells       Review of Systems    A comprehensive review of systems was performed and all systems were negative except for HPI and for the symptom report form, reviewed and scanned in.      Objective:Physical Exam:  Visit Vitals  BP 126/70   Pulse 69   Temp 98 ??F (36.7 ??C) (Temporal)   Resp 18   Ht 5' 5" (1.651 m)   Wt 121 lb 9.6 oz (55.2 kg)   SpO2 100%   BMI 20.24 kg/m??       General:  Alert, cooperative, no distress, appears stated age.   Head:  Normocephalic, without obvious abnormality, atraumatic.   Eyes:  Conjunctivae/corneas clear. PERRL, EOMs intact.   Throat: Lips, mucosa, and tongue normal.    Neck: Supple, symmetrical, trachea midline, no adenopathy, thyroid: no enlargement/tenderness/nodules   Back:   Symmetric, no curvature. ROM normal. No CVA tenderness.   Lungs:   Clear to auscultation bilaterally.   Chest wall:  S/p bilateral mastectomy with reconstruction.    Heart:  Regular rate and rhythm, S1, S2 normal, no murmur, click, rub or gallop.   Abdomen:   Soft, non-tender. Bowel sounds normal. No masses,  No organomegaly.   Extremities:  Extremities normal, atraumatic, no cyanosis.   Skin: No rash   Lymph nodes: Cervical, supraclavicular, and axillary nodes normal.    Neurologic: CNII-XII intact.           Diagnostic Imaging   10/12/12 PET:  Negative for distant mets  10/22/12 brain MRI:  Negative    09/24/12 dexa L hip T -2.5; L spine T -2.3  osteoporosis    12/11/13 bilateral MRI  Negative    03/26/14 MRI Brain  Negative    12/05/14 MRI breast   Negative    12/05/14 [place] L breast?? ????  IMPRESSION:  1. Very small, hypoechoic lesion in continuity and immediately below the ??  dermis overlying the sternum/medialmost left breast. Finding suggests dermal ??  lesion such as epidermal inclusion cyst versus postoperative change.  BI-RADS Category 2 - Benign findings. ??    01/01/16 breast MRI  Negative    08/22/16 Breast MRI  Negative  04/17/17 [place] right ext  IMPRESSION: Probable lipoma in the right axillary region. Given that this lesion is  palpable, clinical follow-up is recommended.     08/31/17 MRI breast  IMPRESSION:   1. No MRI evidence of malignancy.  2. Bilateral silicone implants are intact.  ??  Assessment: ACR BI-RADS category   Left breast: BI-RADS Assessment Category 1: Negative.  Right breast: BI-RADS Assessment Category 1: Negative.    Lab Results  Lab Results   Component Value Date/Time    WBC 3.5 10/30/2015 09:13 AM    HGB 13.2 10/30/2015 09:13 AM    HCT 40.3 10/30/2015 09:13 AM    PLATELET 214 10/30/2015 09:13 AM    MCV 90 10/30/2015 09:13 AM       Lab Results   Component Value Date/Time    Sodium 140 10/30/2015 09:13 AM    Potassium 3.9 10/30/2015 09:13 AM    Chloride 100 10/30/2015 09:13 AM    CO2 25 10/30/2015 09:13 AM    Anion gap 10 02/05/2013 09:44 AM    Glucose 79 10/30/2015 09:13 AM    BUN 7 (L) 10/30/2015 09:13 AM    Creatinine 0.49 (L) 10/30/2015 09:13 AM    BUN/Creatinine ratio 14 10/30/2015 09:13 AM    GFR est AA 121 10/30/2015 09:13 AM    GFR est non-AA 105 10/30/2015 09:13 AM    Calcium 8.2 (L) 10/30/2015 09:13 AM    AST (SGOT) 25 10/30/2015 09:13 AM    Alk. phosphatase 49 10/30/2015 09:13 AM    Protein, total 6.9 10/30/2015 09:13 AM     Albumin 4.4 10/30/2015 09:13 AM    Globulin 2.4 02/05/2013 09:44 AM    A-G Ratio 1.8 10/30/2015 09:13 AM    ALT (SGPT) 18 10/30/2015 09:13 AM     Assessment/Plan:  67 y.o. female with a 0.5 cm left breast cancer, IDC with endocrine features, 1/14 LN +, gr 2, high risk mammaprint,  ER and PR +, HER 2 negative. PS 0    1. Breast cancer with neuroendocrine features, Left, lower inner quadrant, stage IIA, [id number]    With no further therapy, her RR is about 36%; endocrine therapy will decrease her RR to 19%; and 2nd gen chemo will decrease her RR to 12%; 3rd gen to 10%.  These #s may be higher with her high risk mammaprint.     Based on her pheo and now breast cancer history, I recommended genetic counseling -- referral to [facility] made at prior visit. Has seen them and they have performed BRCA testing -- negative.  [facility] has run a PGL panel on 09/10/14.    No evidence of recurrence, continue toremifene.   Will plan to stop 05/2018.  She states she is going on a cruise and plans on taking a [place] holiday during this time.     Last eye exam: 07/2017  Last gyn exam: 12/2017 Dr. Melina Modena    Breast MRI 08/31/17 negative, next due 08/2018, ordered    2. Anxiety/Depression: Stable today; continue sertraline 100 mg daily. Valium prn.  Dr. Kalman Shan is managing her antidepressant    3. Osteoporosis: Had received zometa previously, which was given for one dose on 07/10/13. Switched to prolia 60 mg SC q 6 months, received 04/08/14,10/01/14, 03/10/15, 09/14/15. She had one remaining Prolia injection, but she had oral surgery in July, discontinued    DEXA 10/01/14 at [facility], patient reports improvement. Repeat DEXA 10/07/16 shows severe osteopenia, but slight improvement  Currently taking Vit D3 2000 International Units daily.    Her 2018 dexa showed improvement, would not recommend indefinite prolia at this time.    4. Fibromyalgia/RA: Not currently on any treatment, previously discussed  with patient that there are medical options to assist with her pain. She continues to follow up with her Rheumatologist every 6 weeks. She took MTX prior to cancer diagnosis which she did not tolerate.  Previously did not tolerate lyrica due to sedation.  She reports she has been receiving cortisone injections.     5. Headaches:  Has chronic headaches, uses botox, saw Dr. Rolm Bookbinder.  Worsening, will check brain MRI, has not scheduled.  She plans on ordering this soon.     6. Lipomas under right arm:  Monitoring    7. Left breast mass UOQ pain:  MRI in November 2018 negative; reviewed Dr. Arlyss Queen note; monitoring; ordered 2019 MRI    8. Neck pain: Severe and chronic.  Being monitored by Dr. Maretta Bees and Dr. Gari Crown who referred to PT.  States PT gave her some relief.   She states her stopped PT about 2 weeks ago. She would like some imaging, recommend following up with Rheumatologist and Neurologist. Also recommend continuing PT.  Will plan on Xray of neck.    > 25 minutes were spent with this patient with > 50% of that time spent in face to face counseling.            Sonda Rumble, MD

## 2017-12-28 NOTE — Telephone Encounter (Signed)
Pt called requesting a return call with her imaging results before she goes out of the country on Saturday. She would also like a copy of the report to be faxed to Dr. Bjorn Loser at [phone]

## 2018-01-29 ENCOUNTER — Encounter

## 2018-02-20 ENCOUNTER — Ambulatory Visit: Attending: Specialist

## 2018-02-20 ENCOUNTER — Ambulatory Visit
Admit: 2018-02-20 | Discharge: 2018-02-20 | Payer: PRIVATE HEALTH INSURANCE | Attending: Specialist | Primary: Family Medicine

## 2018-02-20 ENCOUNTER — Encounter: Attending: Specialist | Primary: Family Medicine

## 2018-02-20 DIAGNOSIS — Z170 Estrogen receptor positive status [ER+]: Secondary | ICD-10-CM

## 2018-02-20 DIAGNOSIS — C50312 Malignant neoplasm of lower-inner quadrant of left female breast: Secondary | ICD-10-CM

## 2018-02-20 NOTE — Progress Notes (Signed)
[place] [name] is a 66 y.o. female here for follow-up of breast cancer.

## 2018-02-20 NOTE — Progress Notes (Signed)
[facility]  [place] [place], [place]   [address]  W: [phone]   F: [phone]      f/u HEME/ONC CONSULT    Reason for visit: evaluation for treatment for  Breast cancer    Consulting physician:  Dr. Gilford Rile, Dr. Raynald Blend, Brooke Bonito.  Ref physician:  Dr. Drucilla Schmidt    HPI:   Emily [name] is a 67 y.o.  female who I am seeing for management of breast cancer        She had a screening ultrasound on 09/24/12 showing a 2.6 cm left LN.  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  HER 2 negative IHC at 0.    She has a history of a pheochromocytoma surgically removed in 1997.    She then had a MRI breast on 10/05/12 showing the 2.8 x 2 cm LN as well as a left breast LIQ 0.9 cm x 1.1 cm x 0.6 cm mass.  Biopsy of that mass by FNA on 10/16/12 shows a poorly differentiated carcinoma.  These slides were air-dried, HER 2 not able to be performed on them.    Mammaprint of LN shows high risk luminal, ER + at 0.19, PR + at 0.61, HER 2 negative at -0.69.    She underwent L mastectomy on 11/28/12 showing IDC with neuroendocrine features, gr 2, 0.5 cm, DCIS gr 2-3, + LVI, 1/14 LN + with a 1 cm met, no ECE.  HER 2 negative (ratio 1.2, sig/cell 2.8), ER + 95%, PR + 5%; ki67 5%; DCIS ER + 95%, PR + 1%.    S/p TC q 3 weeks x 4 from 12/25/12-02/26/13 (75 mg/m2; 600 mg/m2)    S/p XRT from 04/02/13-05/10/13    Started anastrozole 05/17/13, stop 07/30/13 (joint pains)    exemestane 08/06/13-09/30/13 (joint pains)    Started zometa 07/10/13, q 6 months x 3 years, stopped     Started tamoxifen 09/30/13, stopped on 03/08/14 due to joint pains    Started letrozole 03/23/14, stopped 09/18/14 due to joint pains    Started tamoxifen 10/04/14, stopped on  12/15/14 due to hot flashes and mood swings.    Anastrozole 12/16/14-01/30/15, stopped due to headache and dizziness    Toremefine 02/09/15-    Interval History: In today for follow up.  Complains of gr 2 insomnia 5/10 pain to neck, gr 2 headache, gr 1 libido, gr 1 vaginal dryness.    Neck pain is worse. Currently working with PT.     Monitoring L breast mass, reviewed Dr. Arlyss Queen notes.    She plans on moving to [place], recently bought house in December.     LMP 1996.    DX   No diagnosis found.   Past Medical History:   Diagnosis Date   ??? Anxiety    ??? Arthritis    ??? Cancer ([place])     left breast  cancer   ??? Chronic tension headaches    ??? Concussion 2011   ??? Headache(784.0)     OCCAS   ??? Ill-defined condition     cellulitis L UE   ??? Lymphedema of arm     left   ??? Nausea & vomiting    ??? Other and unspecified symptoms and  signs involving general sensations and perceptions     HIVES RECENTLY- WAS IN ER Feb 19, 2013   ??? Other ill-defined conditions(799.89)     PHEOCHROMOCYTOMA     Past Surgical History:   Procedure Laterality Date   ??? ABDOMEN SURGERY PROC UNLISTED      hernia mesh repair   ??? ABDOMEN SURGERY PROC UNLISTED      right adenal removed   ??? BREAST SURGERY PROCEDURE UNLISTED  '91    breast implants; replaced bilat implants 2011   ??? HX APPENDECTOMY     ??? HX BREAST AUGMENTATION Left 04/22/2014    REMOVAL OF LEFT BREAST IMPLANT, WASHOUT AND CLOSURE OF LEFT BREAST WOUND performed by Sharlotte Alamo, MD at [place]   ??? HX BREAST RECONSTRUCTION  11/28/2012    BREAST RECONSTRUCTION /C  INSERTION EXPANDER & ALLODERM performed by Sharlotte Alamo, MD at [place]   ??? HX BREAST RECONSTRUCTION Bilateral 09/16/2013    REMOVAL AND REPLACEMENT LEFT BREAST IMPLANT TISSUE EXPANDERS WITH SUBTOTAL CAPSULECTOMY, RIGHT BREAST IMPLANT REMOVAL AND REPLACEMENT FOR SYMMETRY/FAT GRAFTING TO BILATERAL BREASTS performed by Sharlotte Alamo, MD at [facility]   ??? HX GYN      c-section   ??? HX HEENT      T&A   ??? HX HEENT      right  adrenal removed   ??? HX MASTECTOMY Left    ??? HX MODIFIED RADICAL MASTECTOMY  11/28/2012    LEFT BREAST MODIFIED RADICAL MASTECTOMY WITH AXILLARY DISSECTION W/ RECONSTRUCTION LEFT BREAST WITH TISSUE EXPANDER AND ALLODERM;PORTACATH INSERTION performed by Ty Hilts, MD at [place]   ??? HX OOPHORECTOMY      right   ??? HX ORTHOPAEDIC      bilateral shoulder arthroscopy   ??? HX VASCULAR ACCESS      PORT, THEN REMOVED     Social History     Socioeconomic History   ??? Marital status: SINGLE     Spouse name: Not on file   ??? Number of children: Not on file   ??? Years of education: Not on file   ??? Highest education level: Not on file   Tobacco Use   ??? Smoking status: Never Smoker   ??? Smokeless tobacco: Never Used   Substance and Sexual Activity   ??? Alcohol use: No   ??? Drug use: No     Family History   Problem Relation Age of Onset   ??? Alzheimer Mother    ??? Diabetes Mother    ??? Other Mother         COMBATIVE POST ANESTHESIA   ??? Heart Disease Father    ??? Diabetes Father    ??? Lung Disease Father         COPD   ??? Headache Sister    ??? Migraines Sister    ??? Anesth Problems Neg Hx        Current Outpatient Medications   Medication Sig Dispense Refill   ??? OTHER Mastectomy bra and prosethesis    Dx: J50.093 left breast cancer 6 Each 3   ??? FARESTON 60 mg tab TAKE 60 MG BY MOUTH DAILY. 30 Tab 5   ??? zolpidem (AMBIEN) 10 mg tablet TAKE 1 TABLET BY MOUTH AT BEDTIME  3   ??? sertraline (ZOLOFT) 100 mg tablet TAKE 1 TAB BY MOUTH DAILY. 30 Tab 3   ??? BOTOX 200 unit injection      ???  diazePAM (VALIUM) 10 mg tablet TAKE 1/2 TO 1 TABLET BY MOUTH TWICE A DAY AS NEEDED FOR ANXIETY     ??? ondansetron (ZOFRAN ODT) 4 mg disintegrating tablet Take 1 Tab by mouth every eight (8) hours as needed for Nausea. 12 Tab 1   ??? codeine-butalbital-acetaminophen-caffeine (FIORICET WITH CODEINE) 50-325-40-30 mg per capsule      ??? valACYclovir (VALTREX) 1 gram tablet Take 2 tabs by mouth twice a day   Indications: PREVENTION OF HERPES ZOSTER IN  IMMUNOCOMPROMISED PATIENT 120 Tab 5   ??? oxyCODONE IR (ROXICODONE) 5 mg immediate release tablet Take 5 mg by mouth every four (4) hours as needed for Pain.         Allergies   Allergen Reactions   ??? Aspirin Unknown (comments)     Gi bleeding   ??? Compazine [Prochlorperazine] Rash   ??? Keflex [Cephalexin] Rash   ??? Nsaids (Non-Steroidal Anti-Inflammatory Drug) Swelling     Throat swells       Review of Systems    A comprehensive review of systems was performed and all systems were negative except for HPI and for the symptom report form, reviewed and scanned in.      Objective:  Physical Exam:  Visit Vitals  BP 135/73   Pulse 66   Temp 97 ??F (36.1 ??C) (Temporal)   Resp 18   Ht 5' 5"  (1.651 m)   Wt 120 lb (54.4 kg)   SpO2 99%   BMI 19.97 kg/m??       General:  Alert, cooperative, no distress, appears stated age.   Head:  Normocephalic, without obvious abnormality, atraumatic.   Eyes:  Conjunctivae/corneas clear. PERRL, EOMs intact.   Throat: Lips, mucosa, and tongue normal.    Neck: Supple, symmetrical, trachea midline, no adenopathy, thyroid: no enlargement/tenderness/nodules   Back:   Symmetric, no curvature. ROM normal. No CVA tenderness.   Lungs:   Clear to auscultation bilaterally.   Chest wall:  S/p bilateral mastectomy with reconstruction.    Heart:  Regular rate and rhythm, S1, S2 normal, no murmur, click, rub or gallop.   Abdomen:   Soft, non-tender. Bowel sounds normal. No masses,  No organomegaly.   Extremities:  Extremities normal, atraumatic, no cyanosis.   Skin: No rash   Lymph nodes: Cervical, supraclavicular, and axillary nodes normal.   Neurologic: CNII-XII intact.           Diagnostic Imaging   10/12/12 PET:  Negative for distant mets  10/22/12 brain MRI:  Negative    09/24/12 dexa L hip T -2.5; L spine T -2.3  osteoporosis    12/11/13 bilateral MRI  Negative    03/26/14 MRI Brain  Negative    12/05/14 MRI breast   Negative    12/05/14 [place] L breast?? ????  IMPRESSION:  1. Very small, hypoechoic lesion in continuity  and immediately below the ??  dermis overlying the sternum/medialmost left breast. Finding suggests dermal ??  lesion such as epidermal inclusion cyst versus postoperative change.  BI-RADS Category 2 - Benign findings. ??    01/01/16 breast MRI  Negative    08/22/16 Breast MRI  Negative    04/17/17 [place] right ext  IMPRESSION: Probable lipoma in the right axillary region. Given that this lesion is  palpable, clinical follow-up is recommended.     08/31/17 MRI breast  IMPRESSION:   1. No MRI evidence of malignancy.  2. Bilateral silicone implants are intact.  ??  Assessment: ACR BI-RADS category  Left breast: BI-RADS Assessment Category 1: Negative.  Right breast: BI-RADS Assessment Category 1: Negative.    Lab Results  Lab Results   Component Value Date/Time    WBC 3.5 10/30/2015 09:13 AM    HGB 13.2 10/30/2015 09:13 AM    HCT 40.3 10/30/2015 09:13 AM    PLATELET 214 10/30/2015 09:13 AM    MCV 90 10/30/2015 09:13 AM       Lab Results   Component Value Date/Time    Sodium 140 10/30/2015 09:13 AM    Potassium 3.9 10/30/2015 09:13 AM    Chloride 100 10/30/2015 09:13 AM    CO2 25 10/30/2015 09:13 AM    Anion gap 10 02/05/2013 09:44 AM    Glucose 79 10/30/2015 09:13 AM    BUN 7 (L) 10/30/2015 09:13 AM    Creatinine 0.49 (L) 10/30/2015 09:13 AM    BUN/Creatinine ratio 14 10/30/2015 09:13 AM    GFR est AA 121 10/30/2015 09:13 AM    GFR est non-AA 105 10/30/2015 09:13 AM    Calcium 8.2 (L) 10/30/2015 09:13 AM    AST (SGOT) 25 10/30/2015 09:13 AM    Alk. phosphatase 49 10/30/2015 09:13 AM    Protein, total 6.9 10/30/2015 09:13 AM    Albumin 4.4 10/30/2015 09:13 AM    Globulin 2.4 02/05/2013 09:44 AM    A-G Ratio 1.8 10/30/2015 09:13 AM    ALT (SGPT) 18 10/30/2015 09:13 AM     Assessment/Plan:  67 y.o. female with a 0.5 cm left breast cancer, IDC with endocrine features, 1/14 LN +, gr 2, high risk mammaprint,  ER and PR +, HER 2 negative. PS 0    1. Breast cancer with neuroendocrine features, Left, lower inner quadrant, stage IIA,  [id number]    With no further therapy, her RR is about 36%; endocrine therapy will decrease her RR to 19%; and 2nd gen chemo will decrease her RR to 12%; 3rd gen to 10%.  These #s may be higher with her high risk mammaprint.     Based on her pheo and now breast cancer history, I recommended genetic counseling -- referral to [facility] made at prior visit. Has seen them and they have performed BRCA testing -- negative.  [facility] has run a PGL panel on 09/10/14.    No evidence of recurrence, continue toremifene.   Will plan to stop 05/2018.      Last eye exam: 07/2017  Last gyn exam: 12/2017 Dr. Melina Modena    Breast MRI 08/31/17 negative, next due 08/2018, ordered    2. Anxiety/Depression: Stable today; continue sertraline 100 mg daily. Valium prn.  Dr. Kalman Shan is managing her antidepressant    3. Osteoporosis: Had received zometa previously, which was given for one dose on 07/10/13. Switched to prolia 60 mg SC q 6 months, received 04/08/14,10/01/14, 03/10/15, 09/14/15. She had one remaining Prolia injection, but she had oral surgery in July 2017, discontinued    DEXA 10/01/14 at the [facility], patient reports improvement. Repeat DEXA in 2017 showed stable osteoporosis.  dexa in 2018 showed improvement.  Repeat DEXA on 02/18/2018 showed worsening osteoporosis to left femoral neck and lumbar spine. This may have been due to stopping the prolia, as now I would not recommend stopping it unless another oral issue arises.    Currently taking Vit D3 2000 International Units daily.    Will restart prolia 60 mg SC q 6 months in early June    4. Fibromyalgia/RA: Not currently on any treatment,  previously discussed with patient that there are medical options to assist with her pain. She continues to follow up with her Rheumatologist every 6 weeks. She took MTX prior to cancer diagnosis which she did not tolerate.  Previously did not tolerate lyrica due to sedation.  She reports she has been receiving cortisone injections.     5. Headaches:   Has chronic headaches, uses botox, saw Dr. Rolm Bookbinder.  MRI ordered, but these improved, she did not get    6. Left breast mass UOQ pain:  MRI in November 2018 negative; reviewed Dr. Arlyss Queen note; monitoring; ordered 2019 MRI    7. Neck pain: Severe and chronic.  Being monitored by Dr. Maretta Bees and Dr. Gari Crown who referred to PT.  Getting botox.   Xray of neck done at [facility]        > 25 minutes were spent with this patient with > 50% of that time spent in face to face counseling.          Sonda Rumble, MD

## 2018-02-20 NOTE — Progress Notes (Signed)
[facility]  [address] [place]   [address]  W: [phone]   F: [phone]      f/u HEME/ONC CONSULT    Reason for visit: evaluation for treatment for  Breast cancer    Consulting physician:  Dr. Gilford Rile, Dr. Raynald Blend, Brooke Bonito.  Ref physician:  Dr. Drucilla Schmidt    HPI:   Emily [name] is a 67 y.o.  female who I am seeing for management of breast cancer        She had a screening ultrasound on 09/24/12 showing a 2.6 cm left LN.  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  HER 2 negative IHC at 0.    She has a history of a pheochromocytoma surgically removed in 1997.    She then had a MRI breast on 10/05/12 showing the 2.8 x 2 cm LN as well as a left breast LIQ 0.9 cm x 1.1 cm x 0.6 cm mass.  Biopsy of that mass by FNA on 10/16/12 shows a poorly differentiated carcinoma.  These slides were air-dried, HER 2 not able to be performed on them.    Mammaprint of LN shows high risk luminal, ER + at 0.19, PR + at 0.61, HER 2 negative at -0.69.    She underwent L mastectomy on 11/28/12 showing IDC with neuroendocrine features, gr 2, 0.5 cm, DCIS gr 2-3, + LVI, 1/14 LN + with a 1 cm met, no ECE.  HER 2 negative (ratio 1.2, sig/cell 2.8), ER + 95%, PR + 5%; ki67 5%; DCIS ER + 95%, PR + 1%.    S/p TC q 3 weeks x 4 from 12/25/12-02/26/13 (75 mg/m2; 600 mg/m2)    S/p XRT from 04/02/13-05/10/13    Started anastrozole 05/17/13, stop 07/30/13 (joint pains)    exemestane 08/06/13-09/30/13 (joint pains)    Started zometa 07/10/13, q 6 months x 3 years, stopped     Started tamoxifen 09/30/13, stopped on 03/08/14 due to joint pains    Started letrozole 03/23/14, stopped 09/18/14 due to joint pains     Started tamoxifen 10/04/14, stopped on 12/15/14 due to hot flashes and mood swings.    Anastrozole 12/16/14-01/30/15, stopped due to headache and dizziness    Toremefine 02/09/15-    Interval History: In today for follow up.  Complains of gr 2 insomnia 5/10 pain to neck, gr 2 headache, gr 1 libido, gr 1 vaginal dryness.    Neck pain is worse. Currently working with PT.     Monitoring L breast mass, reviewed Dr. Arlyss Queen notes.    She plans on moving to [place], recently bought house in December.     LMP 1996.    DX   No diagnosis found.   Past Medical History:   Diagnosis Date   ??? Anxiety    ??? Arthritis    ??? Cancer ([place])     left breast  cancer   ??? Chronic tension headaches    ??? Concussion 2011   ??? Headache(784.0)     OCCAS   ??? Ill-defined condition     cellulitis L UE   ??? Lymphedema of arm     left   ??? Nausea & vomiting    ??? Other and unspecified symptoms and  signs involving general sensations and perceptions     HIVES RECENTLY- WAS IN ER Feb 19, 2013   ??? Other ill-defined conditions(799.89)     PHEOCHROMOCYTOMA     Past Surgical History:   Procedure Laterality Date   ??? ABDOMEN SURGERY PROC UNLISTED      hernia mesh repair   ??? ABDOMEN SURGERY PROC UNLISTED      right adenal removed   ??? BREAST SURGERY PROCEDURE UNLISTED  '91    breast implants; replaced bilat implants 2011   ??? HX APPENDECTOMY     ??? HX BREAST AUGMENTATION Left 04/22/2014    REMOVAL OF LEFT BREAST IMPLANT, WASHOUT AND CLOSURE OF LEFT BREAST WOUND performed by Sharlotte Alamo, MD at [place]   ??? HX BREAST RECONSTRUCTION  11/28/2012    BREAST RECONSTRUCTION /C  INSERTION EXPANDER & ALLODERM performed by Sharlotte Alamo, MD at [place]   ??? HX BREAST RECONSTRUCTION Bilateral 09/16/2013    REMOVAL AND REPLACEMENT LEFT BREAST IMPLANT TISSUE EXPANDERS WITH SUBTOTAL CAPSULECTOMY, RIGHT BREAST IMPLANT REMOVAL AND REPLACEMENT FOR SYMMETRY/FAT GRAFTING TO BILATERAL BREASTS performed by Sharlotte Alamo, MD at [place]   ??? HX GYN      c-section    ??? HX HEENT      T&A   ??? HX HEENT      right adrenal removed   ??? HX MASTECTOMY Left    ??? HX MODIFIED RADICAL MASTECTOMY  11/28/2012    LEFT BREAST MODIFIED RADICAL MASTECTOMY WITH AXILLARY DISSECTION W/ RECONSTRUCTION LEFT BREAST WITH TISSUE EXPANDER AND ALLODERM;PORTACATH INSERTION performed by Ty Hilts, MD at [place]   ??? HX OOPHORECTOMY      right   ??? HX ORTHOPAEDIC      bilateral shoulder arthroscopy   ??? HX VASCULAR ACCESS      PORT, THEN REMOVED     Social History     Socioeconomic History   ??? Marital status: SINGLE     Spouse name: Not on file   ??? Number of children: Not on file   ??? Years of education: Not on file   ??? Highest education level: Not on file   Tobacco Use   ??? Smoking status: Never Smoker   ??? Smokeless tobacco: Never Used   Substance and Sexual Activity   ??? Alcohol use: No   ??? Drug use: No     Family History   Problem Relation Age of Onset   ??? Alzheimer Mother    ??? Diabetes Mother    ??? Other Mother         COMBATIVE POST ANESTHESIA   ??? Heart Disease Father    ??? Diabetes Father    ??? Lung Disease Father         COPD   ??? Headache Sister    ??? Migraines Sister    ??? Anesth Problems Neg Hx        Current Outpatient Medications   Medication Sig Dispense Refill   ??? OTHER Mastectomy bra and prosethesis    Dx: K74.259 left breast cancer 6 Each 3   ??? FARESTON 60 mg tab TAKE 60 MG BY MOUTH DAILY. 30 Tab 5   ??? zolpidem (AMBIEN) 10 mg tablet TAKE 1 TABLET BY MOUTH AT BEDTIME  3   ??? sertraline (ZOLOFT) 100 mg tablet TAKE 1 TAB BY MOUTH DAILY. 30 Tab 3   ??? BOTOX 200 unit injection      ???  diazePAM (VALIUM) 10 mg tablet TAKE 1/2 TO 1 TABLET BY MOUTH TWICE A DAY AS NEEDED FOR ANXIETY     ??? ondansetron (ZOFRAN ODT) 4 mg disintegrating tablet Take 1 Tab by mouth every eight (8) hours as needed for Nausea. 12 Tab 1   ??? codeine-butalbital-acetaminophen-caffeine (FIORICET WITH CODEINE) 50-325-40-30 mg per capsule      ??? valACYclovir (VALTREX) 1 gram tablet Take 2 tabs by mouth twice a day    Indications: PREVENTION OF HERPES ZOSTER IN IMMUNOCOMPROMISED PATIENT 120 Tab 5   ??? oxyCODONE IR (ROXICODONE) 5 mg immediate release tablet Take 5 mg by mouth every four (4) hours as needed for Pain.         Allergies   Allergen Reactions   ??? Aspirin Unknown (comments)     Gi bleeding   ??? Compazine [Prochlorperazine] Rash   ??? Keflex [Cephalexin] Rash   ??? Nsaids (Non-Steroidal Anti-Inflammatory Drug) Swelling     Throat swells       Review of Systems    A comprehensive review of systems was performed and all systems were negative except for HPI and for the symptom report form, reviewed and scanned in.      Objective:  Physical Exam:  Visit Vitals  BP 135/73   Pulse 66   Temp 97 ??F (36.1 ??C) (Temporal)   Resp 18   Ht '5\' 5"'$  (1.651 m)   Wt 120 lb (54.4 kg)   SpO2 99%   BMI 19.97 kg/m??       General:  Alert, cooperative, no distress, appears stated age.   Head:  Normocephalic, without obvious abnormality, atraumatic.   Eyes:  Conjunctivae/corneas clear. PERRL, EOMs intact.   Throat: Lips, mucosa, and tongue normal.    Neck: Supple, symmetrical, trachea midline, no adenopathy, thyroid: no enlargement/tenderness/nodules   Back:   Symmetric, no curvature. ROM normal. No CVA tenderness.   Lungs:   Clear to auscultation bilaterally.   Chest wall:  S/p bilateral mastectomy with reconstruction.    Heart:  Regular rate and rhythm, S1, S2 normal, no murmur, click, rub or gallop.   Abdomen:   Soft, non-tender. Bowel sounds normal. No masses,  No organomegaly.   Extremities:  Extremities normal, atraumatic, no cyanosis.   Skin: No rash   Lymph nodes: Cervical, supraclavicular, and axillary nodes normal.   Neurologic: CNII-XII intact.           Diagnostic Imaging   10/12/12 PET:  Negative for distant mets  10/22/12 brain MRI:  Negative    09/24/12 dexa L hip T -2.5; L spine T -2.3  osteoporosis    12/11/13 bilateral MRI  Negative    03/26/14 MRI Brain  Negative    12/05/14 MRI breast   Negative    12/05/14 [place] L breast?? ????  IMPRESSION:   1. Very small, hypoechoic lesion in continuity and immediately below the ??  dermis overlying the sternum/medialmost left breast. Finding suggests dermal ??  lesion such as epidermal inclusion cyst versus postoperative change.  BI-RADS Category 2 - Benign findings. ??    01/01/16 breast MRI  Negative    08/22/16 Breast MRI  Negative    04/17/17 [place] right ext  IMPRESSION: Probable lipoma in the right axillary region. Given that this lesion is  palpable, clinical follow-up is recommended.     08/31/17 MRI breast  IMPRESSION:   1. No MRI evidence of malignancy.  2. Bilateral silicone implants are intact.  ??  Assessment: ACR BI-RADS category  Left breast: BI-RADS Assessment Category 1: Negative.  Right breast: BI-RADS Assessment Category 1: Negative.    Lab Results  Lab Results   Component Value Date/Time    WBC 3.5 10/30/2015 09:13 AM    HGB 13.2 10/30/2015 09:13 AM    HCT 40.3 10/30/2015 09:13 AM    PLATELET 214 10/30/2015 09:13 AM    MCV 90 10/30/2015 09:13 AM       Lab Results   Component Value Date/Time    Sodium 140 10/30/2015 09:13 AM    Potassium 3.9 10/30/2015 09:13 AM    Chloride 100 10/30/2015 09:13 AM    CO2 25 10/30/2015 09:13 AM    Anion gap 10 02/05/2013 09:44 AM    Glucose 79 10/30/2015 09:13 AM    BUN 7 (L) 10/30/2015 09:13 AM    Creatinine 0.49 (L) 10/30/2015 09:13 AM    BUN/Creatinine ratio 14 10/30/2015 09:13 AM    GFR est AA 121 10/30/2015 09:13 AM    GFR est non-AA 105 10/30/2015 09:13 AM    Calcium 8.2 (L) 10/30/2015 09:13 AM    AST (SGOT) 25 10/30/2015 09:13 AM    Alk. phosphatase 49 10/30/2015 09:13 AM    Protein, total 6.9 10/30/2015 09:13 AM    Albumin 4.4 10/30/2015 09:13 AM    Globulin 2.4 02/05/2013 09:44 AM    A-G Ratio 1.8 10/30/2015 09:13 AM    ALT (SGPT) 18 10/30/2015 09:13 AM     Assessment/Plan:  67 y.o. female with a 0.5 cm left breast cancer, IDC with endocrine features, 1/14 LN +, gr 2, high risk mammaprint,  ER and PR +, HER 2 negative. PS 0     1. Breast cancer with neuroendocrine features, Left, lower inner quadrant, stage IIA, [id number]    With no further therapy, her RR is about 36%; endocrine therapy will decrease her RR to 19%; and 2nd gen chemo will decrease her RR to 12%; 3rd gen to 10%.  These #s may be higher with her high risk mammaprint.     Based on her pheo and now breast cancer history, I recommended genetic counseling -- referral to [facility] made at prior visit. Has seen them and they have performed BRCA testing -- negative.  [facility] has run a PGL panel on 09/10/14.    No evidence of recurrence, continue toremifene.   Will plan to stop 05/2018.      Last eye exam: 07/2017  Last gyn exam: 12/2017 Dr. Melina Modena    Breast MRI 08/31/17 negative, next due 08/2018, ordered    2. Anxiety/Depression: Stable today; continue sertraline 100 mg daily. Valium prn.  Dr. Kalman Shan is managing her antidepressant    3. Osteoporosis: Had received zometa previously, which was given for one dose on 07/10/13. Switched to prolia 60 mg SC q 6 months, received 04/08/14,10/01/14, 03/10/15, 09/14/15. She had one remaining Prolia injection, but she had oral surgery in July 2017, discontinued    DEXA 10/01/14 at the [facility], patient reports improvement. Repeat DEXA in 2017 showed stable osteoporosis.  dexa in 2018 showed improvement.  Repeat DEXA on 02/18/2018 showed worsening osteoporosis to left femoral neck and lumbar spine. This may have been due to stopping the prolia, as now I would not recommend stopping it unless another oral issue arises.    Currently taking Vit D3 2000 International Units daily.    Will restart prolia 60 mg SC q 6 months in early June    4. Fibromyalgia/RA: Not currently on any treatment,  previously discussed with patient that there are medical options to assist with her pain. She continues to follow up with her Rheumatologist every 6 weeks. She took MTX prior to cancer diagnosis which she did not tolerate.  Previously did not  tolerate lyrica due to sedation.  She reports she has been receiving cortisone injections.     5. Headaches:  Has chronic headaches, uses botox, saw Dr. Rolm Bookbinder.  MRI ordered, but these improved, she did not get    6. Left breast mass UOQ pain:  MRI in November 2018 negative; reviewed Dr. Arlyss Queen note; monitoring; ordered 2019 MRI    7. Neck pain: Severe and chronic.  Being monitored by Dr. Maretta Bees and Dr. Gari Crown who referred to PT.  Getting botox.   Xray of neck done at [facility]        > 25 minutes were spent with this patient with > 50% of that time spent in face to face counseling.          Sonda Rumble, MD

## 2018-02-20 NOTE — Telephone Encounter (Signed)
Patient left a voicemail and stated that she was seen with [name] and needed to be scheduled for prolia on 6/4 at 10:00AM. [name].    Called patient and informed her that [name] is out this week and I would have to send a message to someone else to schedule this appointment. Informed patient that someone would be calling her to get this scheduled. Patient verbalized understanding and denied any further questions/concerns.

## 2018-03-05 ENCOUNTER — Encounter

## 2018-03-05 MED ORDER — DENOSUMAB 60 MG/ML SUB-Q SYRINGE
60 mg/mL | SUBCUTANEOUS | 2 refills | Status: DC
Start: 2018-03-05 — End: 2018-05-18

## 2018-03-06 ENCOUNTER — Inpatient Hospital Stay: Payer: BLUE CROSS/BLUE SHIELD | Primary: Family Medicine

## 2018-03-06 ENCOUNTER — Inpatient Hospital Stay: Admit: 2018-03-06 | Payer: BLUE CROSS/BLUE SHIELD | Primary: Family Medicine

## 2018-03-06 ENCOUNTER — Encounter: Payer: BLUE CROSS/BLUE SHIELD | Primary: Family Medicine

## 2018-03-06 DIAGNOSIS — C50919 Malignant neoplasm of unspecified site of unspecified female breast: Secondary | ICD-10-CM

## 2018-03-06 NOTE — Progress Notes (Signed)
 Progress Notes by Adrien Macintosh A at 03/06/18 0915                Author: Adrien Macintosh A  Service: Physical Therapy  Author Type: Physical Therapist       Filed: 03/06/18 1000  Date of Service: 03/06/18 0915  Status: Signed          Editor: Adrien Macintosh LABOR (Physical Therapist)                                                                Shel[facility]wenn [facility]   [facility]  [address]   [address]  [place], [place]  [address]         EVALUATION      NAME: Emily [name]  AGE: 67 y.o.   GENDER: female   DATE: 03/06/2018   REFERRING PHYSICIAN:   Elsie Bennett, MD     HISTORY AND BACKGROUND:  Pt is a 67 y/o female returns for evaluation of L UE lymphedema.  Pt states she is continuing with wear of day/night  time compression garment, reporting day/night time compression garments in need of replacement.  Pt reports recent episode of cellulitis L UE, ttreated with oral antibiotics, with last dose taken 2 days ago.  Patient states she is feeling better.  Arrives  wearing compression sleeve which was fit in 2016.      Breast cancer history at time of initial evaluation as follows:      Pt found to have 2.6 cm LN L axilla 09/24/2012.  Underwent biopsy on 10/02/2012 with findings of metastatic neoplasm.  MRI on 10/05/2012 revealed L breast mass in addition to lymph node.  Biopsy of breast mass on 10/16/2012 showed poorly differentiated carcinoma.   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  Pt completed chemotherapy and XRT.  Pt received  care to address L UE lymphedema at [facility], prior to coming to this clinic. Pt presented at initial eval with pain L axilla, reporting fluid pocket in region, severe pain with shoulder movement, and feeling of fullness L upper arm.  Pt stated  symptoms presented post surgery.  Pt reports bilat shoulder dysfunction prior to cancer diagnosis, having undergone R rotator cuff repair 7 years ago, L rotator cuff repair 3 years ago. Pt is s/p bilateral breast reconstruction 09/16/2013.   States additional reconstructive surgery scheduled for 01/30/2014.  Pt underwent multiple surgeries for reconstruction bilateral breasts, reduction with implant placement R breast, small implant placement with fat grafting L breast.  Pt states continued  concerns regarding healing L implant was tenuous, and she has refrained from use of vasopneumatic pump to address lymphedema per Dr. Jackye order.  Pt reports she has been compliant with day/night time compression garment wear L UE, as instructed.      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  L UE lymphedema.   LLIS lymphedema outcomes measure: 30/68; 44% impairment; CK   Past Medical History:      Past Medical History:        Diagnosis  Date         ?  Anxiety       ?  Arthritis       ?  Cancer [facility])            left breast  cancer         ?  Chronic tension headaches       ?  Concussion  2011     ?  Headache(784.0)            OCCAS         ?  Ill-defined condition            cellulitis L UE         ?  Lymphedema of arm            left         ?  Nausea & vomiting       ?  Other and unspecified symptoms and signs involving general sensations and perceptions            HIVES RECENTLY- WAS IN ER Feb 19, 2013         ?  Other ill-defined conditions(799.89)            PHEOCHROMOCYTOMA          Past Surgical History:         Procedure  Laterality  Date          ?  ABDOMEN SURGERY PROC UNLISTED              hernia mesh repair          ?   ABDOMEN SURGERY PROC UNLISTED              right adenal removed          ?  BREAST SURGERY PROCEDURE UNLISTED    '91          breast implants; replaced bilat implants 2011          ?  HX APPENDECTOMY         ?  HX BREAST AUGMENTATION  Left  04/22/2014          REMOVAL OF LEFT BREAST IMPLANT, WASHOUT AND CLOSURE OF LEFT BREAST WOUND performed by Garnette CHRISTELLA Door, MD at [facility] MAIN OR          ?  HX BREAST RECONSTRUCTION    11/28/2012          BREAST RECONSTRUCTION /C  INSERTION EXPANDER & ALLODERM performed by Garnette CHRISTELLA Door, MD at [facility] AMBULATORY OR          ?  HX BREAST RECONSTRUCTION  Bilateral  09/16/2013          REMOVAL AND REPLACEMENT LEFT BREAST IMPLANT TISSUE EXPANDERS WITH SUBTOTAL CAPSULECTOMY, RIGHT BREAST IMPLANT REMOVAL AND REPLACEMENT FOR SYMMETRY/FAT  GRAFTING TO BILATERAL BREASTS performed by Garnette CHRISTELLA Door, MD at [facility] MAIN OR          ?  HX GYN              c-section          ?  HX HEENT  T&A          ?  HX HEENT              right adrenal removed          ?  HX MASTECTOMY  Left       ?  HX MODIFIED RADICAL MASTECTOMY    11/28/2012          LEFT BREAST MODIFIED RADICAL MASTECTOMY WITH AXILLARY DISSECTION W/ RECONSTRUCTION LEFT BREAST WITH TISSUE EXPANDER AND ALLODERM;PORTACATH INSERTION  performed by James Pellicane Jr V, MD at [facility] AMBULATORY OR          ?  HX OOPHORECTOMY              right          ?  HX ORTHOPAEDIC              bilateral shoulder arthroscopy          ?  HX VASCULAR ACCESS              PORT, THEN REMOVED        Current Medications:       Current Outpatient Medications        Medication  Sig         ?  denosumab  (PROLIA ) 60 mg/mL injection  1 mL by SubCUTAneous route every three (3) months.     ?  diazePAM (VALIUM) 10 mg tablet  TAKE 1/2 TO 1 TABLET BY MOUTH TWICE A DAY AS NEEDED FOR ANXIETY     ?  OTHER  Mastectomy bra and prosethesis      Dx: R49.687 left breast cancer     ?  FARESTON 60 mg tab  TAKE 60 MG BY MOUTH DAILY.     ?  zolpidem  (AMBIEN ) 10 mg tablet  TAKE 1 TABLET BY  MOUTH AT BEDTIME     ?  ondansetron  (ZOFRAN  ODT) 4 mg disintegrating tablet  Take 1 Tab by mouth every eight (8) hours as needed for Nausea.     ?  codeine-butalbital-acetaminophen -caffeine (FIORICET WITH CODEINE) 50-325-40-30 mg per capsule       ?  valACYclovir (VALTREX) 1 gram tablet  Take 2 tabs by mouth twice a day   Indications: PREVENTION OF HERPES ZOSTER IN IMMUNOCOMPROMISED PATIENT     ?  oxyCODONE  IR (ROXICODONE ) 5 mg immediate release tablet  Take 5 mg by mouth every four (4) hours as needed for Pain.     ?  sertraline (ZOLOFT) 100 mg tablet  TAKE 1 TAB BY MOUTH DAILY.         ?  BOTOX 200 unit injection            No current facility-administered medications for this encounter.         Allergies:      Allergies        Allergen  Reactions         ?  Aspirin  Unknown (comments)             Gi bleeding         ?  Compazine [Prochlorperazine]  Rash     ?  Keflex [Cephalexin]  Rash     ?  Nsaids (Non-Steroidal Anti-Inflammatory Drug)  Swelling             Throat swells         Social/Work History and Prior Level of [place]: [place] in [place].  Pt has a 35 year  old son which she and her partner care for.    Living Situation: Primary residence [address] of [place].      Trainable Caregiver?: Pt's significant other is an Chief Financial Officer. He is available to assist pt as needed.     Self-care/ADLs: performing mod I, additional time, UE dressing deliberately to avoid pain L shoulder.      Mobility: indep transfers, bed mobility, gait in clinic.    Sleeping Arrangement:  Bed.    Adaptive Equipment Owned: na   Previous Therapy:  Pt received therapy for lymphedema management at [facility] prior to coming to this clinic. 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  Pt experienced progression of stage 0 lymphedema to stage II lymphedema L UE end of May, with increase in limb volume beginning of  June 2015.  Phase  I CDT initiated at that time including application of compression bandaging. Custom flat knit compression garments for daytime use and Solaris Tribute for night time use were fit upon discontinuing compression bandaging.  Currently wearing ready made  daytime compression sleeve, CCL 1 with effective management of lymphedema, continuing with night time wear of Tribute--pt reporting poor fit and in need of replacement.   Compression/Lymphedema Equipment:  Custom flat knit compression sleeve and custom glove, 2 sets, most recently received after appt at this  clinic 2017.  Currently wearing garments that were ordered and fit in 2016, with patient aware that garments are old and no longer effective. Pt has  Solaris Tribute arm sleeve in need of replacement per her report; Lymphapress Comfy sleeve pump, which  she was instructed by Dr. Daril to avoid using.  Pt wearing old custom flat knit 3021 compression sleeve to appt today.        SUBJECTIVE:     Pt arrives today with old custom daytime compression sleeve donned upon arrival.  Pt continues with night time wear of Solaris Alphonzo, which  is in need of replacement. Patient has completed oral antibiotic to address recent cellulitis.  States she was receiving outpatient PT [place], and stopped going despite improving regarding shoulder dysfunction.   Patients goals for therapy: Lymphedema management        EVALUATION AND OBJECTIVE DATA SUMMARY:     Pain: 3/10; upper arm heaviness.               Skin and Tissue Assessment:   Dermal Status:      ( x)  Intact: L UE  ( )  Dry     ( )  Tenuous:   ( )  Flaky     ( )  Wound/lesion present        ( )  Dermatitis       Texture/Consistency:      (x )  Boggy:  Forearm/tricep regions most problematic at this time.  ( )  Pitting Edema     ( )  Brawny  (x )  Implants: bilateral breast     ( x)  Fibrotic/Woody:  Forearm arm.       Pigmentation/Color Change:      ( )  Normal:    ( )  Hemosiderin     ( )  Red:    ( )  Erythematous     ( )  Hyperpigmented:    ( )  Hyperlipodermatosclerosis  Anomalies:        (x)  Axillary web syndrome persists L UE.  ( )  Vesicles     ( )  Petechiae  ( )  Warty Vercusis     ( )  Bullae  ( )  Papilloma        Nails:   (x )  Normal   ( )  Fungus   Stemmers Sign: negative   Volumetric Measurements:       Right: 1474.98 mL  Left:  1618.66 mL     % Difference:9.74%  Dominance: Right     (See scanned graph)   Posture:  forward head/shoulder posture noted.   Range of motion: bilateral shoulder flex/abd 75; Elbow/wrist/hand WFL   Strength: bilateral shoulder flex 3+/5, limiting shoulder muscle testing secondary to c/o pain.  Remaining UE strength grossly 4+/5.   Mobility:      Bed/Chair Mobility:  indep  Transfers:  indep     Sitting Balance:  good  Standing Balance:  good     Gait:  indep  Wheelchair Mobility:  na     Endurance:  intact for current level of activity, including gait community distances,  working abbreviated schedule.  Stairs:  Not assessed.            Safety:     Patient is alert and oriented:  x4     Safety awareness:  intact     Fall Risk?:  Low, use of pain meds evening hours.          Precautions:  Standard lymphedema precautions to include avoiding blood pressure  readings, injections and IVs or other procedures/acts that could lead to broken skin on affected area, and avoiding excessive heat, resistive activity or altitude without compression garment      Evaluation Time: 8:30-8:58 am 28 minutes        TREATMENT PROVIDED:     1.  Treatment description:  The patient  was educated regarding the role and function of the lymphatic system, and instructed in the lymphedema management protocol of complete decongestive therapy (CDT).  This includes skin care  to prevent breakdown or infection, lymphedema exercises, custom compression therapy options (bandaging, compression garments, compression pump, Aundra Knight, [employer], [employer], etc. as needed), and decongestion with manual  lymphatic drainage as indicated.   We discussed the need for consistent compression system for lymphedema management, with Solaris tribute night time garment brought by pt to clinic for assessment secondary to poor fit and concerns  regarding irritation at thumb and between fingers with wear.  No s/s of infection noted, however, patient reports recent episode of L UE cellulitis with antibiotic completed 2 days ago, s/s resolved.  Pt advised that, in the presence of active infection,  she needs to discontinue compression garment wear, launder all garments, resuming wear of garment upon completion of oral antibiotic and resolution of s/s of infection.  Patient instructed in the importance of performing skin care nightly, addressing  any breaks in the skin immediately by washing with soap and water, and applying barrier ointment.  Patient advised that daytime compression garments are effective for 6 months, with garments needing to be discarded after that period of wear, with 2 sets  of daytime garments required annually.  Patient advised that continuing wear of daytime garments that are 65 years old, as she currently continues, does not provide optimal management of lymphedema, and places her at higher risk of developing cellulitis.  Patient verbalized understanding of  instruction provided today.  Patient would benefit from Flexitouch pump to address chest/axillary lymphedema involvement, chronic and repetitive cellulitis infections, and fibrotic skin/tissue changes forearm.  Completed  measurements for day/night time compression garments, to be fit in clinic upon receiving for fit.  Patient to continue wear of garments currently owned as noted below.   Compression garment routine:   1.   Apply daytime compression  garments to clean, dry extremity first thing in the morning, EVERY MORNING.     2.  Wear garment until bedtime, adjusting throughout the day as needed should garment wrinkle or crease.  Problem areas can be  at joint, and top of garment, ensuring proximal aspect of garment is positioned appropriately on extremity.   3.  Launder compression garments nightly either by hand or washing machine in a garment bag or pillowcase.  Use mild detergent with NO FABRIC SOFTENER, NO BLEACH, NO WOOLITE.  Wash in cool/warm water.     4.  Dry garment in dryer on low heat right side out in garment bag or pillowcase.   5.  Daytime compression garments are NOT safe to sleep in, so remove at bedtime.     6.  Perform skin care to extremity, cleansing skin daily with soap and water, and using low pH lotion, upward strokes to stimulate lymphatic vessels.  Perform self MLD.   7.  Apply night time compression garment as instructed to wear while sleeping, adjusting throughout the night as needed for comfort.   8.  Perform exercise program with daytime compression stockings on.   Signs/Symptoms of infection include:  Fever, flu-like symptoms, pain/redness/warmth in extremity, sometimes with increase  in swelling present.  Stop wearing your compression garment if this occurs.  Call your doctor immediately or go to the Emergency room to address potential infection.   Remove compression with changes in circulation or numbness in extremity, different from what you may experience when not wearing compression garment, pain,  unexplained shortness of breath.        Treatment time:  8:59-9:24am   25 minutes     Manual therapy 2               ASSESSMENT:     Emily [name] is a  67 y.o. female who presents with  stage 2 lymphedema L UE, with forearm/upper arm boggy, fibrotic tissue changes forearm, chest involvement, with UE limb volume discrepancy 9.25% noted today, with optimal discrepancy of 0.45% on 08/05/2016.  Pt has had recent episode of celluitis last  week with 2 episodes reported when seen 07/2017, all treated with oral antibiotics.  Solaris Tribute in need of replacement, with garment order placed, and custom flat knit daytime garments in need  of replacement.  Pt has been wearing old compression  garments, 65 years old, which may have contribute to recent cellulitis episode. Pt advised to return to wear of most recently fit compression garments during daytime hours, and to continue wear of night time [employer], laundering all garments s/p  cellulitis prior to resuming wear.  Pt continues with bilat shoulder dysfunction, orthopedic in nature, which was present prior to breast cancer diagnosis and treatment, with previous attempts to treat in this clinic and other outpatient ortho clinic  resulting in severe increase in pain, with patient reporting discontinuing ortho PT.  Home management of lymphedema concerning compression garment wear to be continued at this time as noted above, with pt to return to clinic for fit new Solaris Tribute  night  time garment and daytime garments in clinic upon receiving, with noted adjustments to garment above, measurements scanned into chart.  Patient would benefit from Flexitouch pump for home use as noted above.      This care is medically necessary due to the infection risk with lymphedema, and to improve functional activities.  CDT is necessary to resolve swelling to allow patient to return to wearing normal clothes/footwear, and prevent worsening of symptoms, such  as venous stasis ulcerations, infections, or hospitalizations.  Patient  will be independent with home program strategies to allow improved ADL ability and mobility and to allow patient to return to greatest functional independence.        Pt will benefit from 2-5 prn PT visits over the next 6-12 weeks to establish effective and comfortable fit of night time compression garment, and to order daytime compression garments as needed based on age and wear of current garment. Multiple cellulitis  episodes concerning.        PLAN OF CARE:     Compression garment order/fit   Education in skin care   Therapeutic exercise     Goals: 6-12 weeks   1.  Instruct the  patient to be independent with proper skin care to prevent future skin breakdown and decrease  the potential risk for infections that are associated with Lymphedema.   2.  Patient will be independent with a personal lymphedema exercise program to assist with the lymphatic flow and reduce limb volume.   3.  Patient will understand the signs and symptoms of acute infection.      4.   Patient will have knowledge of the compression options and acquire a safe and appropriate daytime and night time compression system to prevent     re-accumulation of fluid.   5.  Patient or family will be able to don/doff garments independently.  Garment system effectiveness will be evaluated prior to discharge for better long-term    management and outcomes.   6.   To transition patient to independent restorative phase of CDT.            Patient has participated in goal setting and agrees to work toward plan of care.   Patient was instructed to call if questions or concerns arise.      Thank you for this referral.   Avelina Patch, PT, CLT     Time Calculation: 54 mins        TREATMENT PLAN EFFECTIVE DATES:     03/06/2018-06/02/2018   I have read the above plan of care for [facility].  I certify the above prescribed services are required by this patient and are  medically necessary.  The above plan of care has been developed in conjunction with the lymphedema/physical therapist.          Physician Signature: ____________________________________Date: ______________

## 2018-03-06 NOTE — Progress Notes (Signed)
[facility]  Lymphedema Clinic and Cancer Rehabilitation  [address]  [address]  [place]  [address]      EVALUATION    NAME: Emily [name] AGE: 67 y.o.  GENDER: female  DATE: 03/06/2018  REFERRING PHYSICIAN:  Michaele Offer, MD  HISTORY AND BACKGROUND:  Pt is a 67 y/o female returns for evaluation of L UE lymphedema.  Pt states she is continuing with wear of day/night time compression garment, reporting day/night time compression garments in need of replacement.  Pt reports recent episode of cellulitis L UE, ttreated with oral antibiotics, with last dose taken 2 days ago.  Patient states she is feeling better.  Arrives wearing compression sleeve which was fit in 2016.    Breast cancer history at time of initial evaluation as follows:    Pt found to have 2.6 cm LN L axilla 09/24/2012.  Underwent biopsy on 10/02/2012 with findings of metastatic neoplasm.  MRI on 10/05/2012 revealed L breast mass in addition to lymph node.  Biopsy of breast mass on 10/16/2012 showed poorly differentiated carcinoma.  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  Pt completed chemotherapy and XRT.  Pt received care to address L UE lymphedema at [facility], prior to coming to this clinic. Pt presented at initial eval with pain L axilla, reporting "fluid pocket" in region, severe pain with shoulder movement, and feeling of "fullness" L upper arm. Pt stated symptoms presented post surgery.  Pt reports bilat shoulder dysfunction prior to cancer diagnosis, having undergone R rotator cuff repair 7 years ago, L rotator cuff repair 3 years ago. Pt is s/p bilateral breast reconstruction 09/16/2013.  States additional reconstructive surgery scheduled for 01/30/2014.  Pt underwent multiple surgeries for  reconstruction bilateral breasts, reduction with implant placement R breast, small implant placement with fat grafting L breast.  Pt states continued concerns regarding healing L implant was tenuous, and she has refrained from use of vasopneumatic pump to address lymphedema per Dr. Benjaman Lobe order.  Pt reports she has been compliant with day/night time compression garment wear L UE, as instructed.    Primary Diagnosis:  ?? 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 L UE cellulitis (L03.114)  Other Treatment Diagnoses:  ?? Malignant neoplasm of breast (C50.2)  Date of Onset: 11/29/2011  Present Symptoms and Functional Limitations: Pain L axilla/upper arm/upper quadrant anterior/lateral aspect.  L UE lymphedema.  LLIS lymphedema outcomes measure: 30/68; 44% impairment; CK  Past Medical History:   Past Medical History:   Diagnosis Date   ??? Anxiety    ??? Arthritis    ??? Cancer ([place])     left breast  cancer   ??? Chronic tension headaches    ??? Concussion 2011   ??? Headache(784.0)     OCCAS   ??? Ill-defined condition     cellulitis L UE   ??? Lymphedema of arm     left   ??? Nausea & vomiting    ??? Other and unspecified symptoms and signs involving general sensations and perceptions     HIVES RECENTLY- WAS IN ER Feb 19, 2013   ??? Other ill-defined conditions(799.89)     PHEOCHROMOCYTOMA     Past Surgical History:   Procedure Laterality Date   ??? ABDOMEN SURGERY PROC UNLISTED  hernia mesh repair   ??? ABDOMEN SURGERY PROC UNLISTED      right adenal removed   ??? BREAST SURGERY PROCEDURE UNLISTED  '91    breast implants; replaced bilat implants 2011   ??? HX APPENDECTOMY     ??? HX BREAST AUGMENTATION Left 04/22/2014    REMOVAL OF LEFT BREAST IMPLANT, WASHOUT AND CLOSURE OF LEFT BREAST WOUND performed by Sharlotte Alamo, MD at [place]   ??? HX BREAST RECONSTRUCTION  11/28/2012    BREAST RECONSTRUCTION /C  INSERTION EXPANDER & ALLODERM performed by Sharlotte Alamo, MD at [place]    ??? HX BREAST RECONSTRUCTION Bilateral 09/16/2013    REMOVAL AND REPLACEMENT LEFT BREAST IMPLANT TISSUE EXPANDERS WITH SUBTOTAL CAPSULECTOMY, RIGHT BREAST IMPLANT REMOVAL AND REPLACEMENT FOR SYMMETRY/FAT GRAFTING TO BILATERAL BREASTS performed by Sharlotte Alamo, MD at [place]   ??? HX GYN      c-section   ??? HX HEENT      T&A   ??? HX HEENT      right adrenal removed   ??? HX MASTECTOMY Left    ??? HX MODIFIED RADICAL MASTECTOMY  11/28/2012    LEFT BREAST MODIFIED RADICAL MASTECTOMY WITH AXILLARY DISSECTION W/ RECONSTRUCTION LEFT BREAST WITH TISSUE EXPANDER AND ALLODERM;PORTACATH INSERTION performed by Ty Hilts, MD at [place]   ??? HX OOPHORECTOMY      right   ??? HX ORTHOPAEDIC      bilateral shoulder arthroscopy   ??? HX VASCULAR ACCESS      PORT, THEN REMOVED     Current Medications:    Current Outpatient Medications   Medication Sig   ??? denosumab (PROLIA) 60 mg/mL injection 1 mL by SubCUTAneous route every three (3) months.   ??? diazePAM (VALIUM) 10 mg tablet TAKE 1/2 TO 1 TABLET BY MOUTH TWICE A DAY AS NEEDED FOR ANXIETY   ??? OTHER Mastectomy bra and prosethesis    Dx: A21.308 left breast cancer   ??? FARESTON 60 mg tab TAKE 60 MG BY MOUTH DAILY.   ??? zolpidem (AMBIEN) 10 mg tablet TAKE 1 TABLET BY MOUTH AT BEDTIME   ??? ondansetron (ZOFRAN ODT) 4 mg disintegrating tablet Take 1 Tab by mouth every eight (8) hours as needed for Nausea.   ??? codeine-butalbital-acetaminophen-caffeine (FIORICET WITH CODEINE) 50-325-40-30 mg per capsule    ??? valACYclovir (VALTREX) 1 gram tablet Take 2 tabs by mouth twice a day   Indications: PREVENTION OF HERPES ZOSTER IN IMMUNOCOMPROMISED PATIENT   ??? oxyCODONE IR (ROXICODONE) 5 mg immediate release tablet Take 5 mg by mouth every four (4) hours as needed for Pain.   ??? sertraline (ZOLOFT) 100 mg tablet TAKE 1 TAB BY MOUTH DAILY.   ??? BOTOX 200 unit injection      No current facility-administered medications for this encounter.      Allergies:   Allergies   Allergen Reactions    ??? Aspirin Unknown (comments)     Gi bleeding   ??? Compazine [Prochlorperazine] Rash   ??? Keflex [Cephalexin] Rash   ??? Nsaids (Non-Steroidal Anti-Inflammatory Drug) Swelling     Throat swells      Social/Work History and Prior Level of Function: Pt [place] the [place] end, spending some time in [place].  Pt has a 65 year old son which she and her partner care for.   Living Situation: Primary residence [place].     Trainable Caregiver?: Pt's significant other is an Conservation officer, historic buildings.  He is available to assist pt as needed.    Self-care/ADLs: performing mod I, additional time, UE dressing deliberately to avoid pain L shoulder.     Mobility: indep transfers, bed mobility, gait in clinic.   Sleeping Arrangement:  Bed.   Adaptive Equipment Owned: na  Previous Therapy:  Pt received therapy for lymphedema management at [facility] prior to coming to this clinic. 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  Pt experienced progression of stage 0 lymphedema to stage II lymphedema L UE end of May, with increase in limb volume beginning of June 2015.  Phase I CDT initiated at that time including application of compression bandaging. Custom flat knit compression garments for daytime use and Solaris Tribute for night time use were fit upon discontinuing compression bandaging.  Currently wearing ready made daytime compression sleeve, CCL 1 with effective management of lymphedema, continuing with night time wear of Tribute--pt reporting poor fit and in need of replacement.  Compression/Lymphedema Equipment:  Custom flat knit compression sleeve and custom glove, 2 sets, most recently received after appt at this clinic 2017.  Currently wearing garments that were ordered and fit in 2016, with patient aware that garments are old and no longer effective. Pt has  Solaris Tribute arm sleeve in need of replacement per her report;  Lymphapress Comfy sleeve pump, which she was instructed by Dr. Verl Blalock to avoid using.  Pt wearing old custom flat knit 3021 compression sleeve to appt today.    SUBJECTIVE:   Pt arrives today with old custom daytime compression sleeve donned upon arrival.  Pt continues with night time wear of Solaris Lelan Pons, which is in need of replacement. Patient has completed oral antibiotic to address recent cellulitis.  States she was receiving outpatient PT on [place], and stopped going despite improving regarding shoulder dysfunction.  Patient???s goals for therapy: Lymphedema management    EVALUATION AND OBJECTIVE DATA SUMMARY:   Pain: 3/10; upper arm heaviness.           Skin and Tissue Assessment:  Dermal Status:  ( x)  Intact: L UE ( )  Dry   ( )  Tenuous:  ( )  Flaky   ( )  Wound/lesion present     ( )  Dermatitis    Texture/Consistency:  (x )  Boggy:  Forearm/tricep regions most problematic at this time. ( )  Pitting Edema   ( )  Brawny (x )  Implants: bilateral breast   ( x)  Fibrotic/Woody:  Forearm arm.    Pigmentation/Color Change:  ( )  Normal:   ( )  Hemosiderin   ( )  Red:  ( )  Erythematous   ( )  Hyperpigmented:   ( )  Hyperlipodermatosclerosis   Anomalies:    (x)  Axillary web syndrome persists L UE. ( )  Vesicles   ( )  Petechiae ( )  Warty Vercusis   ( )  Bullae ( )  Papilloma     Nails:  (x )  Normal  ( )  Fungus  Stemmers Sign: negative  Volumetric Measurements:   Right: 1474.98 mL Left:  1660.66 mL   % Difference:9.74% Dominance: Right   (See scanned graph)  Posture:  forward head/shoulder posture noted.  Range of motion: bilateral shoulder flex/abd 75; Elbow/wrist/hand WFL  Strength: bilateral shoulder flex 3+/5, limiting shoulder muscle testing secondary to c/o pain.  Remaining UE strength grossly 4+/5.  Mobility:  Bed/Chair Mobility:  indep Transfers:  indep   Sitting Balance:  good Standing Balance:  good   Gait:  indep Wheelchair Mobility:  na    Endurance:  intact for current level of activity, including gait community distances, working abbreviated schedule. Stairs:  Not assessed.       Safety:  Patient is alert and oriented:  x4   Safety awareness:  intact   Fall Risk?:  Low, use of pain meds evening hours.      Precautions:  Standard lymphedema precautions to include avoiding blood pressure readings, injections and IVs or other procedures/acts that could lead to broken skin on affected area, and avoiding excessive heat, resistive activity or altitude without compression garment    Evaluation Time: 8:30-8:58 am 28 minutes    TREATMENT PROVIDED:   1.  Treatment description:  The patient was educated regarding the role and function of the lymphatic system, and instructed in the lymphedema management protocol of complete decongestive therapy (CDT).  This includes skin care to prevent breakdown or infection, lymphedema exercises, custom compression therapy options (bandaging, compression garments, compression pump, Lenore Cordia, [employer], [employer], etc. as needed), and decongestion with manual lymphatic drainage as indicated.  We discussed the need for consistent compression system for lymphedema management, with Solaris tribute night time garment brought by pt to clinic for assessment secondary to poor fit and concerns regarding irritation at thumb and between fingers with wear.  No s/s of infection noted, however, patient reports recent episode of L UE cellulitis with antibiotic completed 2 days ago, s/s resolved.  Pt advised that, in the presence of active infection, she needs to discontinue compression garment wear, launder all garments, resuming wear of garment upon completion of oral antibiotic and resolution of s/s of infection.  Patient instructed in the importance of performing skin care nightly, addressing any breaks in the skin immediately by washing with soap and water, and applying barrier ointment.  Patient  advised that daytime compression garments are effective for 6 months, with garments needing to be discarded after that period of wear, with 2 sets of daytime garments required annually.  Patient advised that continuing wear of daytime garments that are 65 years old, as she currently continues, does not provide optimal management of lymphedema, and places her at higher risk of developing cellulitis. Patient verbalized understanding of instruction provided today.  Patient would benefit from Flexitouch pump to address chest/axillary lymphedema involvement, chronic and repetitive cellulitis infections, and fibrotic skin/tissue changes forearm.  Completed measurements for day/night time compression garments, to be fit in clinic upon receiving for fit.  Patient to continue wear of garments currently owned as noted below.  Compression garment routine:  1.  Apply daytime compression garments to clean, dry extremity first thing in the morning, EVERY MORNING.    2. Wear garment until bedtime, adjusting throughout the day as needed should garment wrinkle or crease.  Problem areas can be at joint, and top of garment, ensuring proximal aspect of garment is positioned appropriately on extremity.  3. Launder compression garments nightly either by hand or washing machine in a garment bag or pillowcase.  Use mild detergent with NO FABRIC SOFTENER, NO BLEACH, NO WOOLITE.  Wash in cool/warm water.    4. Dry garment in dryer on low heat right side out in garment bag or pillowcase.  5. Daytime compression garments are NOT safe to sleep in, so remove at bedtime.    6. Perform skin care to extremity, cleansing skin  daily with soap and water, and using low pH lotion, upward strokes to stimulate lymphatic vessels.  Perform self MLD.  7. Apply night time compression garment as instructed to wear while sleeping, adjusting throughout the night as needed for comfort.  8. Perform exercise program with daytime compression stockings on.   Signs/Symptoms of infection include:  Fever, flu-like symptoms, pain/redness/warmth in extremity, sometimes with increase in swelling present.  Stop wearing your compression garment if this occurs.  Call your doctor immediately or go to the Emergency room to address potential infection.  Remove compression with changes in circulation or numbness in extremity, different from what you may experience when not wearing compression garment, pain, unexplained shortness of breath.      Treatment time:  8:59-9:24am   25 minutes     Manual therapy 2         ASSESSMENT:   SARAFINA PUTHOFF is a 67 y.o. female who presents with stage 2 lymphedema L UE, with forearm/upper arm boggy, fibrotic tissue changes forearm, chest involvement, with UE limb volume discrepancy 9.25% noted today, with optimal discrepancy of 0.45% on 08/05/2016.  Pt has had recent episode of celluitis last week with 2 episodes reported when seen 07/2017, all treated with oral antibiotics.  Solaris Tribute in need of replacement, with garment order placed, and custom flat knit daytime garments in need of replacement.  Pt has been wearing old compression garments, 65 years old, which may have contribute to recent cellulitis episode. Pt advised to return to wear of most recently fit compression garments during daytime hours, and to continue wear of night time [employer], laundering all garments s/p cellulitis prior to resuming wear.  Pt continues with bilat shoulder dysfunction, orthopedic in nature, which was present prior to breast cancer diagnosis and treatment, with previous attempts to treat in this clinic and other outpatient ortho clinic resulting in severe increase in pain, with patient reporting discontinuing ortho PT.  Home management of lymphedema concerning compression garment wear to be continued at this time as noted above, with pt to return to clinic for fit new Solaris Tribute night time garment and daytime garments in clinic upon receiving,  with noted adjustments to garment above, measurements scanned into chart.  Patient would benefit from Flexitouch pump for home use as noted above.    This care is medically necessary due to the infection risk with lymphedema, and to improve functional activities.  CDT is necessary to resolve swelling to allow patient to return to wearing normal clothes/footwear, and prevent worsening of symptoms, such as venous stasis ulcerations, infections, or hospitalizations.  Patient will be independent with home program strategies to allow improved ADL ability and mobility and to allow patient to return to greatest functional independence.      Pt will benefit from 2-5 prn PT visits over the next 6-12 weeks to establish effective and comfortable fit of night time compression garment, and to order daytime compression garments as needed based on age and wear of current garment. Multiple cellulitis episodes concerning.    PLAN OF CARE:    Compression garment order/fit  Education in skin care  Therapeutic exercise   Goals: 6-12 weeks  1. Instruct the patient to be independent with proper skin care to prevent future skin breakdown and decrease the potential risk for infections that are associated with Lymphedema.  2. Patient will be independent with a personal lymphedema exercise program to assist with the lymphatic flow and reduce limb volume.  3. Patient will  understand the signs and symptoms of acute infection.    4.   Patient will have knowledge of the compression options and acquire a safe and appropriate daytime and night time compression system to prevent    re-accumulation of fluid.  5.  Patient or family will be able to don/doff garments independently.  Garment system effectiveness will be evaluated prior to discharge for better long-term   management and outcomes.  6.   To transition patient to independent restorative phase of CDT.        Patient has participated in goal setting and agrees to work toward plan of care.   Patient was instructed to call if questions or concerns arise.    Thank you for this referral.  Kalman Shan, PT, CLT    Time Calculation: 54 mins    TREATMENT PLAN EFFECTIVE DATES:   03/06/2018-06/02/2018  I have read the above plan of care for [facility].  I certify the above prescribed services are required by this patient and are medically necessary.  The above plan of care has been developed in conjunction with the lymphedema/physical therapist.       Physician Signature: ____________________________________Date:______________

## 2018-03-14 NOTE — Telephone Encounter (Signed)
Patient called and stated she was supposed to start a new injection for arthritis, and would like to speak to a nurse since she has not heard anything back from [place]. CB#[phone]

## 2018-03-15 NOTE — Telephone Encounter (Addendum)
03/15/18 10:50 AM: Returned call to patient. No answer; left voicemail requesting call back.     03/15/18 11:37 AM: Received call from patient, who stated that she spoke to her gynecologist, Dr. Melina Modena, this morning and Dr. Melina Modena stated that she can receive Prolia at her office. Patient requested that RN call Dr. Kathi Simpers office about how to proceed with this. Denied further questions or concerns at this time.

## 2018-03-22 ENCOUNTER — Ambulatory Visit: Attending: Specialist

## 2018-03-22 ENCOUNTER — Ambulatory Visit: Admit: 2018-03-22 | Payer: PRIVATE HEALTH INSURANCE | Attending: Specialist | Primary: Family Medicine

## 2018-03-22 DIAGNOSIS — D071 Carcinoma in situ of vulva: Secondary | ICD-10-CM

## 2018-03-22 NOTE — Progress Notes (Signed)
Progress  Notes by Albertha Ghee, MD at 03/22/18 1330                Author: Albertha Ghee, MD  Service: --  Author Type: Physician       Filed: 03/22/18 1459  Encounter Date: 03/22/2018  Status: Signed          Editor: Albertha Ghee, MD (Physician)               [facility]   [address] [place] [address]   P [phone]   F [phone]      Office Note   Patient ID:   Name:  Emily [name]   MRN:  [id number]   DOB:  1951-03-14/67 y.o.   Date:  03/22/2018         HISTORY OF PRESENT ILLNESS:   Emily [name] is a 67 y.o. Caucasian postmenopausal female who is being seen for a second opinion for vulvar dysplasia.  She is a patient of Dr. Crissie Sickles.  Two separate biopsies of the right labia minora revealed high-grade squamous intraepithelial  lesion, severe vulvar dysplasia, or VIN 3.  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  He has asked that I see her for consultation.           ROS:   GU and GI review:  Negative   Cardiopulmonary review:  Negative    Musculoskeletal:  Negative      A comprehensive review of systems was negative except for that written in the History of Present Illness., 10 point ROS         OB/GYN ROS:   There is no history of significant gyn problems or procedures.   Patient denies any abnormal bleeding or vaginal discharge.          Problem List:     Patient Active Problem List           Diagnosis  Date Noted         ?  VIN III (vulvar intraepithelial neoplasia III)  03/22/2018     ?  Lymphedema of left arm  08/21/2015     ?  Pheochromocytoma  08/21/2015     ?  Arthritis of both knees  08/21/2015     ?  Intractable chronic paroxysmal hemicrania  08/21/2015     ?  Malignant neoplasm of lower-inner quadrant of left female breast ([place])  07/24/2015         ?  Breast cancer, stage 2 ([place])  10/22/2012        PMH:     Past Medical History:        Diagnosis  Date         ?  Anxiety       ?  Arthritis       ?  Cancer [facility])             left breast  cancer         ?  Chronic tension headaches       ?  Concussion  2011     ?  Headache(784.0)            OCCAS         ?  Ill-defined condition            cellulitis L UE         ?  Lymphedema of  arm            left         ?  Nausea & vomiting       ?  Other and unspecified symptoms and signs involving general sensations and perceptions            HIVES RECENTLY- WAS IN ER Feb 19, 2013         ?  Other ill-defined conditions(799.89)            PHEOCHROMOCYTOMA         PSH:     Past Surgical History:         Procedure  Laterality  Date          ?  ABDOMEN SURGERY PROC UNLISTED              hernia mesh repair          ?  ABDOMEN SURGERY PROC UNLISTED              right adenal removed          ?  BREAST SURGERY PROCEDURE UNLISTED    '91          breast implants; replaced bilat implants 2011          ?  HX APPENDECTOMY         ?  HX BREAST AUGMENTATION  Left  04/22/2014          REMOVAL OF LEFT BREAST IMPLANT, WASHOUT AND CLOSURE OF LEFT BREAST WOUND performed by Sharlotte Alamo, MD at [place]          ?  HX BREAST RECONSTRUCTION    11/28/2012          BREAST RECONSTRUCTION /C  INSERTION EXPANDER & ALLODERM performed by Sharlotte Alamo, MD at [place]          ?  HX BREAST RECONSTRUCTION  Bilateral  09/16/2013          REMOVAL AND REPLACEMENT LEFT BREAST IMPLANT TISSUE EXPANDERS WITH SUBTOTAL CAPSULECTOMY, RIGHT BREAST IMPLANT REMOVAL AND REPLACEMENT FOR SYMMETRY/FAT  GRAFTING TO BILATERAL BREASTS performed by Sharlotte Alamo, MD at [place]          ?  HX GYN              c-section          ?  HX HEENT              T&A          ?  HX HEENT              right adrenal removed          ?  HX MASTECTOMY  Left       ?  HX MODIFIED RADICAL MASTECTOMY    11/28/2012          LEFT BREAST MODIFIED RADICAL MASTECTOMY WITH AXILLARY DISSECTION W/ RECONSTRUCTION LEFT BREAST WITH TISSUE EXPANDER AND ALLODERM;PORTACATH INSERTION  performed by Ty Hilts, MD at [employer]          ?  HX  OOPHORECTOMY              right          ?  HX ORTHOPAEDIC              bilateral shoulder arthroscopy          ?  HX VASCULAR ACCESS              PORT, THEN REMOVED         Social History:     Social History          Tobacco Use         ?  Smoking status:  Never Smoker     ?  Smokeless tobacco:  Never Used       Substance Use Topics         ?  Alcohol use:  No         Family History:     Family History         Problem  Relation  Age of Onset          ?  Alzheimer  Mother       ?  Diabetes  Mother       ?  Other  Mother                COMBATIVE POST ANESTHESIA          ?  Heart Disease  Father       ?  Diabetes  Father       ?  Lung Disease  Father                COPD          ?  Headache  Sister       ?  Migraines  Sister            ?  Anesth Problems  Neg Hx           Medications: (reviewed)     Current Outpatient Medications        Medication  Sig         ?  DULoxetine (CYMBALTA) 20 mg capsule  TAKE 1 CAPSULE BY MOUTH EVERY DAY     ?  OTHER  Mastectomy bra and prosethesis      Dx: X52.841 left breast cancer     ?  FARESTON 60 mg tab  TAKE 60 MG BY MOUTH DAILY.     ?  BOTOX 200 unit injection       ?  denosumab (PROLIA) 60 mg/mL injection  1 mL by SubCUTAneous route every three (3) months.     ?  diazePAM (VALIUM) 10 mg tablet  TAKE 1/2 TO 1 TABLET BY MOUTH TWICE A DAY AS NEEDED FOR ANXIETY     ?  zolpidem (AMBIEN) 10 mg tablet  TAKE 1 TABLET BY MOUTH AT BEDTIME     ?  ondansetron (ZOFRAN ODT) 4 mg disintegrating tablet  Take 1 Tab by mouth every eight (8) hours as needed for Nausea.     ?  codeine-butalbital-acetaminophen-caffeine (FIORICET WITH CODEINE) 50-325-40-30 mg per capsule       ?  valACYclovir (VALTREX) 1 gram tablet  Take 2 tabs by mouth twice a day   Indications: PREVENTION OF HERPES ZOSTER IN IMMUNOCOMPROMISED PATIENT     ?  oxyCODONE IR (ROXICODONE) 5 mg immediate release tablet  Take 5 mg by mouth every four (4) hours as needed for Pain.         ?  sertraline (ZOLOFT) 100 mg tablet  TAKE 1 TAB BY  MOUTH DAILY.          No current facility-administered medications for this visit.         Allergies: (reviewed)  Allergies        Allergen  Reactions         ?  Aspirin  Unknown (comments)             Gi bleeding         ?  Bacitracin  Hives and Swelling     ?  Compazine [Prochlorperazine]  Rash     ?  Keflex [Cephalexin]  Rash     ?  Nsaids (Non-Steroidal Anti-Inflammatory Drug)  Swelling             Throat swells               OBJECTIVE:      Physical Exam:      VITAL SIGNS:  Vitals:        03/22/18 1336      BP:  129/70      Pulse:  84      Weight:  120 lb 6.4 oz (54.6 kg)      Height:  5\' 5"  (1.651 m)            Body mass index is 20.04 kg/m??.        GENERAL APP:  Conversant, alert, oriented. No acute distress.     HEENT:  HEENT. No thyroid enlargement. No JVD.    Neck: Supple without restrictions.     RESPIRATORY:  Clear to auscultation and percussion to the bases. No CVAT.     CARDIOVASC:  RRR without murmur/rub.     GASTROINT:  soft, non-tender, without masses or organomegaly     MUSCULOSKEL:  no joint tenderness, deformity or swelling        EXTREMITIES:  extremities normal, atraumatic, no cyanosis or edema        PELVIC:                  RECTAL:  Deferred        NODAL SURVEY:  No suspicious lymphadenopathy or edema noted.        NEURO:  Grossly intact. No acute deficit.           Lab Data:        Lab Results         Component  Value  Date/Time            WBC  3.5  10/30/2015 09:13 AM       HGB  13.2  10/30/2015 09:13 AM       HCT  40.3  10/30/2015 09:13 AM       PLATELET  214  10/30/2015 09:13 AM            MCV  90  10/30/2015 09:13 AM          Lab Results         Component  Value  Date/Time            Sodium  140  10/30/2015 09:13 AM       Potassium  3.9  10/30/2015 09:13 AM       Chloride  100  10/30/2015 09:13 AM       CO2  25  10/30/2015 09:13 AM       Anion gap  10  02/05/2013 09:44 AM       Glucose  79  10/30/2015 09:13 AM       BUN  7 (L)  10/30/2015 09:13 AM       Creatinine  0.49 (L)   10/30/2015 09:13  AM       BUN/Creatinine ratio  14  10/30/2015 09:13 AM       GFR est AA  121  10/30/2015 09:13 AM       GFR est non-AA  105  10/30/2015 09:13 AM            Calcium  8.2 (L)  10/30/2015 09:13 AM              Imaging:   None available         IMPRESSION/PLAN:   Emily [name] is a 67 y.o. female with a working diagnosis of VIN 3.  I reviewed with Emily [name] her medical records, physical exam, and review of symptoms.   I discussed the pathology with her and explained the need for surgical excision of the lesion.  Since it is a precancerous lesion, the resection margins do not need to be radical.  A wide local excision or simple partial vulvectomy should be all that's  required.  I would recommend excising the skin tag along with the lesion, as they are immediately adjacent to one another.  She was counseled on the risks, benefits, indications, and alternatives of surgery.  Her questions were answered and she wishes  to proceed as planned.               Signed By:  Albertha Ghee, MD           03/22/2018/10:07 AM

## 2018-03-22 NOTE — Progress Notes (Signed)
New Patient, Referred by Dr. Cephus Richer    1. Have you been to the ER, urgent care clinic since your last visit?  Hospitalized since your last visit?  no    2. Have you seen or consulted any other health care providers outside of the [facility] since your last visit?  Include any pap smears or colon screening.   Yes, Dr. Cephus Richer

## 2018-03-22 NOTE — Progress Notes (Signed)
[place], [place] [address]  P [phone]   F [phone]    Office Note  Patient ID:  Name:  Emily [name]  MRN:  [id number]  DOB:  18-May-1951/67 y.o.  Date:  03/22/2018      HISTORY OF PRESENT ILLNESS:  Emily [name] is a 67 y.o. Caucasian postmenopausal female who is being seen for a second opinion for vulvar dysplasia.  She is a patient of Dr. Crissie Sickles.  Two separate biopsies of the right labia minora revealed high-grade squamous intraepithelial lesion, severe vulvar dysplasia, or VIN 3.  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  He has asked that I see her for consultation.        ROS:  GU and GI review:  Negative  Cardiopulmonary review:  Negative   Musculoskeletal:  Negative    A comprehensive review of systems was negative except for that written in the History of Present Illness., 10 point ROS      OB/GYN ROS:  There is no history of significant gyn problems or procedures.  Patient denies any abnormal bleeding or vaginal discharge.       Problem List:  Patient Active Problem List    Diagnosis Date Noted   ??? VIN III (vulvar intraepithelial neoplasia III) 03/22/2018   ??? Lymphedema of left arm 08/21/2015   ??? Pheochromocytoma 08/21/2015   ??? Arthritis of both knees 08/21/2015   ??? 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 Malignant neoplasm of lower-inner quadrant of left female breast ([place]) 07/24/2015   ??? Breast cancer, stage 2 ([place]) 10/22/2012     PMH:  Past Medical History:   Diagnosis Date   ??? Anxiety    ??? Arthritis    ??? Cancer ([place])     left breast  cancer   ??? Chronic tension headaches    ??? Concussion 2011   ??? Headache(784.0)     OCCAS   ??? Ill-defined condition     cellulitis L UE   ??? Lymphedema of arm     left   ??? Nausea & vomiting    ??? Other and unspecified symptoms and signs involving general sensations and perceptions     HIVES RECENTLY- WAS IN ER Feb 19, 2013   ??? Other ill-defined conditions(799.89)      PHEOCHROMOCYTOMA      PSH:  Past Surgical History:   Procedure Laterality Date   ??? ABDOMEN SURGERY PROC UNLISTED      hernia mesh repair   ??? ABDOMEN SURGERY PROC UNLISTED      right adenal removed   ??? BREAST SURGERY PROCEDURE UNLISTED  '91    breast implants; replaced bilat implants 2011   ??? HX APPENDECTOMY     ??? HX BREAST AUGMENTATION Left 04/22/2014    REMOVAL OF LEFT BREAST IMPLANT, WASHOUT AND CLOSURE OF LEFT BREAST WOUND performed by Sharlotte Alamo, MD at [place]   ??? HX BREAST RECONSTRUCTION  11/28/2012    BREAST RECONSTRUCTION /C  INSERTION EXPANDER & ALLODERM performed by Sharlotte Alamo, MD at [place]   ??? HX BREAST RECONSTRUCTION Bilateral 09/16/2013    REMOVAL AND REPLACEMENT LEFT BREAST IMPLANT TISSUE EXPANDERS WITH SUBTOTAL CAPSULECTOMY, RIGHT BREAST IMPLANT REMOVAL AND REPLACEMENT FOR SYMMETRY/FAT GRAFTING TO BILATERAL BREASTS performed by Sharlotte Alamo, MD at [place]   ??? HX GYN  c-section   ??? HX HEENT      T&A   ??? HX HEENT      right adrenal removed   ??? HX MASTECTOMY Left    ??? HX MODIFIED RADICAL MASTECTOMY  11/28/2012    LEFT BREAST MODIFIED RADICAL MASTECTOMY WITH AXILLARY DISSECTION W/ RECONSTRUCTION LEFT BREAST WITH TISSUE EXPANDER AND ALLODERM;PORTACATH INSERTION performed by Ty Hilts, MD at [place]   ??? HX OOPHORECTOMY      right   ??? HX ORTHOPAEDIC      bilateral shoulder arthroscopy   ??? HX VASCULAR ACCESS      PORT, THEN REMOVED      Social History:  Social History     Tobacco Use   ??? Smoking status: Never Smoker   ??? Smokeless tobacco: Never Used   Substance Use Topics   ??? Alcohol use: No      Family History:  Family History   Problem Relation Age of Onset   ??? Alzheimer Mother    ??? Diabetes Mother    ??? Other Mother         COMBATIVE POST ANESTHESIA   ??? Heart Disease Father    ??? Diabetes Father    ??? Lung Disease Father         COPD   ??? Headache Sister    ??? Migraines Sister    ??? Anesth Problems Neg Hx       Medications: (reviewed)   Current Outpatient Medications   Medication Sig   ??? DULoxetine (CYMBALTA) 20 mg capsule TAKE 1 CAPSULE BY MOUTH EVERY DAY   ??? OTHER Mastectomy bra and prosethesis    Dx: I95.188 left breast cancer   ??? FARESTON 60 mg tab TAKE 60 MG BY MOUTH DAILY.   ??? BOTOX 200 unit injection    ??? denosumab (PROLIA) 60 mg/mL injection 1 mL by SubCUTAneous route every three (3) months.   ??? diazePAM (VALIUM) 10 mg tablet TAKE 1/2 TO 1 TABLET BY MOUTH TWICE A DAY AS NEEDED FOR ANXIETY   ??? zolpidem (AMBIEN) 10 mg tablet TAKE 1 TABLET BY MOUTH AT BEDTIME   ??? ondansetron (ZOFRAN ODT) 4 mg disintegrating tablet Take 1 Tab by mouth every eight (8) hours as needed for Nausea.   ??? codeine-butalbital-acetaminophen-caffeine (FIORICET WITH CODEINE) 50-325-40-30 mg per capsule    ??? valACYclovir (VALTREX) 1 gram tablet Take 2 tabs by mouth twice a day   Indications: PREVENTION OF HERPES ZOSTER IN IMMUNOCOMPROMISED PATIENT   ??? oxyCODONE IR (ROXICODONE) 5 mg immediate release tablet Take 5 mg by mouth every four (4) hours as needed for Pain.   ??? sertraline (ZOLOFT) 100 mg tablet TAKE 1 TAB BY MOUTH DAILY.     No current facility-administered medications for this visit.      Allergies: (reviewed)  Allergies   Allergen Reactions   ??? Aspirin Unknown (comments)     Gi bleeding   ??? Bacitracin Hives and Swelling   ??? Compazine [Prochlorperazine] Rash   ??? Keflex [Cephalexin] Rash   ??? Nsaids (Non-Steroidal Anti-Inflammatory Drug) Swelling     Throat swells          OBJECTIVE:    Physical Exam:  VITAL SIGNS: Vitals:    03/22/18 1336   BP: 129/70   Pulse: 84   Weight: 120 lb 6.4 oz (54.6 kg)   Height: 5\' 5"  (1.651 m)     Body mass index is 20.04 kg/m??.   GENERAL APP: Conversant, alert, oriented.  No acute distress.   HEENT: HEENT. No thyroid enlargement. No JVD.   Neck: Supple without restrictions.   RESPIRATORY: Clear to auscultation and percussion to the bases. No CVAT.   CARDIOVASC: RRR without murmur/rub.    GASTROINT: soft, non-tender, without masses or organomegaly   MUSCULOSKEL: no joint tenderness, deformity or swelling   EXTREMITIES: extremities normal, atraumatic, no cyanosis or edema   PELVIC:        RECTAL: Deferred   NODAL SURVEY: No suspicious lymphadenopathy or edema noted.   NEURO: Grossly intact. No acute deficit.       Lab Data:    Lab Results   Component Value Date/Time    WBC 3.5 10/30/2015 09:13 AM    HGB 13.2 10/30/2015 09:13 AM    HCT 40.3 10/30/2015 09:13 AM    PLATELET 214 10/30/2015 09:13 AM    MCV 90 10/30/2015 09:13 AM     Lab Results   Component Value Date/Time    Sodium 140 10/30/2015 09:13 AM    Potassium 3.9 10/30/2015 09:13 AM    Chloride 100 10/30/2015 09:13 AM    CO2 25 10/30/2015 09:13 AM    Anion gap 10 02/05/2013 09:44 AM    Glucose 79 10/30/2015 09:13 AM    BUN 7 (L) 10/30/2015 09:13 AM    Creatinine 0.49 (L) 10/30/2015 09:13 AM    BUN/Creatinine ratio 14 10/30/2015 09:13 AM    GFR est AA 121 10/30/2015 09:13 AM    GFR est non-AA 105 10/30/2015 09:13 AM    Calcium 8.2 (L) 10/30/2015 09:13 AM         Imaging:  None available      IMPRESSION/PLAN:  VALERA VALLAS is a 67 y.o. female with a working diagnosis of VIN 3.  I reviewed with Marnee Spring her medical records, physical exam, and review of symptoms.  I discussed the pathology with her and explained the need for surgical excision of the lesion.  Since it is a precancerous lesion, the resection margins do not need to be radical.  A wide local excision or simple partial vulvectomy should be all that's required.  I would recommend excising the skin tag along with the lesion, as they are immediately adjacent to one another.  She was counseled on the risks, benefits, indications, and alternatives of surgery.  Her questions were answered and she wishes to proceed as planned.        Signed By: Albertha Ghee, MD     03/22/2018/10:07 AM

## 2018-03-22 NOTE — Progress Notes (Signed)
New Patient, Referred by Dr. Crissie Sickles    1. Have you been to the ER, urgent care clinic since your last visit?  Hospitalized since your last visit?  no    2. Have you seen or consulted any other health care providers outside of the [facility] since your last visit?  Include any pap smears or colon screening.   Yes, Dr. Crissie Sickles

## 2018-03-28 NOTE — Interval H&P Note (Signed)
PAT TELEPHONE INTERVIEW COMPLETED. PRE-OP INSTRUCTIONS REVIEWED WITH PATIENT WHICH INCLUDED INSTRUCTIONS ON MEDICATIONS AND NPO AFTER MIDNIGHT. PATIENT VOICED UNDERSTANDING OF SAME.

## 2018-03-28 NOTE — Other (Signed)
PAT TELEPHONE INTERVIEW COMPLETED. PRE-OP INSTRUCTIONS REVIEWED WITH PATIENT WHICH INCLUDED INSTRUCTIONS ON MEDICATIONS AND NPO AFTER MIDNIGHT. PATIENT VOICED UNDERSTANDING OF SAME.

## 2018-04-06 ENCOUNTER — Encounter: Attending: Specialist | Primary: Family Medicine

## 2018-04-09 ENCOUNTER — Inpatient Hospital Stay: Admit: 2018-04-09 | Payer: BLUE CROSS/BLUE SHIELD | Primary: Family Medicine

## 2018-04-09 DIAGNOSIS — I890 Lymphedema, not elsewhere classified: Secondary | ICD-10-CM

## 2018-04-09 NOTE — Progress Notes (Signed)
 _                                    [facility]  [facility]  [address]  [address]  [place], [place]  [address]        LYmphedema Therapy  Visit: 2    [x]                   Daily note               []                  30 day/10th visit progress note    NAME: Emily [name]  DATE: 04/09/2018    Goals: 6-12 weeks  1. Instruct the patient to be independent with proper skin care to prevent future skin breakdown and decrease the potential risk for infections that are associated with Lymphedema.  2. Patient will be independent with a personal lymphedema exercise program to assist with the lymphatic flow and reduce limb volume.  3. Patient will understand the signs and symptoms of acute infection.    4.   Patient will have knowledge of the compression options and acquire a safe and appropriate daytime and night time compression system to prevent                             re-accumulation of fluid.  5.  Patient or family will be able to don/doff garments independently.  Garment system effectiveness will be evaluated prior to discharge for better long-term              management and outcomes.  6.   To transition patient to independent restorative phase of CDT.           SUBJECTIVE REPORT:  Patient arrives with Latricia Hand which she received in 2018.  Reports poor fit, primarily at hand.  States she received documentation from insurance company regarding authorization of compression garments recommended at time of most recent visit.  Anxious to proceed with Flexitouch pump trial.  States she is currently on Augmentin  for treatment of cellulitis, initiated last week.   Pain: 3/10, limb heaviness L UE.  7/10 at end range neural glides to address AWS.                                Gait:   [x]   Independent gait without any assistive device for community distances  ADLs: indep  Treatment Response:    [x]    Patient reviewed packet received at evaluation  [x]    Patient  completed home program as prescribed  []    Patient not fully compliant with home program to date  [x]    Patient waiting on compression garment arrival  Function:   []    Patient requiring less assistance with completion of home program  []    ADLs are requiring less assistance   []    Patient able to return to work/leisure activities  TREATMENT AND OBJECTIVE DATA SUMMARY:   Patient/Family Education:      Educated in skin care: [x]    Skin care products  []    Hygiene  [x]   Prevention of cellulitis  []    Wound care     Educated in exercise: []    Walking  program  []    Gertie ball routine  []    Stick routine  []    ROM routine     Instructed in self MLD:   Written sequence given and reviewed with patient as well as demonstration and instruction during MLD portion of the session.     Instructed in don/doff of compression system:   []    Multi layer bandage (MLB) donning principles and wear precautions  [x]    Day garments  [x]    Night garments       Therapeutic Exercise/Procedure : 9:08-9:20 12 minutes     Neural glides Demonstration by therapist of written routine including median nerve glides which are effective in treatment of AWS:  1. Shoulder abd paired with wrist extension, alternating elbow flex/ext holding 10 seconds at comfortable tension.  2. Shoulder abd paired with elbow extension, active wrist flex/ext maintaining fluid motion x 10.  3. Shoulder flexion paired with elbow extension, active wrist flex/ext maintaining fluid motion x 10.  Patient performed each x 10 reps.           Home program: Patient to perform daily to BID:  [x]    Skin care  [x]    Deep abdominal breathing  [x]    Exercise routine  []    Walking program  []    Rest in supine   []    Compression bandage  [x]    Compression garments  [x]    Vasopneumatic device: Flexitouch trial tomorrow  []    Wound care  [x]    Self MLD  [x]    Bring supplies to each therapy visit  []    Purchase necessary supplies  []    Weight loss program  []    Follow up with        Rationale: Exercise will increase the lymph angiomotoricity and tissue pressure of the skin and thus decrease swelling.     Manual Therapy: 8:38-8:50 am  Orthotic management: 8:51-9:06 12 minutes  15 minutes     Area to decongest:    [x]  UE          []   Right     [x]   Left      [x]  Trunk      []  LE             []   Right     []   Left      []  Trunk         Manual therapy: Limb volume measurements completed today with limb volume discrepancy of 11%, increased from 9.25%, potentially a result of persistent cellulitis.  Note lymphedema involvement L upper quadrant, with scarring L breast s/p mastectomy and reconstruction.  Note AWS persistent, with pain as noted above.  L shoulder flex/abd limited to 90 degrees today secondary to pain.    Reassessed measurements for daytime/night time compression garments ordered 03/06/2018, with measurements consistent with initial assessment.   Skin/wound care/debridement:    Upper extremity compression:  Orthotic management: Compression garments L UE  Upper Extremity [x]     Day []    Night []     Bilateral []     Assessed fit of Solaris Tribute ordered 07/2017, with good fit noted.  Patient in agreement today, stating she prefers to defer considered alteration with patient in agreement.    Spoke with [employer] rep with plan to complete pump trial in clinic tomorrow at 11:00               TOTAL TREATMENT 42 mins       ASSESSMENT:  Treatment effectiveness and tolerance: Note elevated limb volumes today, possible resultant from cellulitis episode, with patient continuing oral antibiotic.  No redness, warmth to touch noted L UE.  Patient advised in importance of skin care to reduce breaks in skin.  Awaiting recently ordered garments for fit in clinic upon receiving.  Note current limb volume discrepancy of 11%, with upper quadrant involvement, AWS, scarring, and pain noted.   Progress toward goals: Ongoing.     PLAN OF CARE:   Changes to the plan of care: [employer] medical rep to trial  pump in clinic tomorrow.  Fit day/night time compression garments in clinic upon receiving.  Monitor lymphedema, assessing limb volume measurements.   Frequency: []   2 times a week  []   Weekly  []   Biweekly  []   prn           Avelina Patch, PT, CLT

## 2018-04-09 NOTE — Progress Notes (Signed)
_                                    [facility]  [address]  [address]  [place]  [address]        LYmphedema Therapy  Visit: 2    [x]                   Daily note               []                  30 day/10th visit progress note    NAME: Emily [name]  DATE: 04/09/2018    Goals: 6-12 weeks  1. Instruct the patient to be independent with proper skin care to prevent future skin breakdown and decrease the potential risk for infections that are associated with Lymphedema.  2. Patient will be independent with a personal lymphedema exercise program to assist with the lymphatic flow and reduce limb volume.  3. Patient will understand the signs and symptoms of acute infection.  ??  4.   Patient will have knowledge of the compression options and acquire a safe and appropriate daytime and night time compression system to prevent                             re-accumulation of fluid.  5.  Patient or family will be able to don/doff garments independently.  Garment system effectiveness will be evaluated prior to discharge for better long-term              management and outcomes.  6.   To transition patient to independent restorative phase of CDT.           SUBJECTIVE REPORT:  Patient arrives with Emily [name] which she received in 2018.  Reports poor fit, primarily at hand.  States she received documentation from insurance company regarding authorization of compression garments recommended at time of most recent visit.  Anxious to proceed with Flexitouch pump trial.  States she is currently on Augmentin for treatment of cellulitis, initiated last week.   Pain: 3/10, limb heaviness L UE.  7/10 at end range neural glides to address AWS.                                Gait:   [x]   Independent gait without any assistive device for community distances  ADLs: indep  Treatment Response:    [x]    Patient reviewed packet received at evaluation   [x]    Patient completed home program as prescribed  []    Patient not fully compliant with home program to date  [x]    Patient waiting on compression garment arrival  Function:   []    Patient requiring less assistance with completion of home program  []    ADLs are requiring less assistance   []    Patient able to return to work/leisure activities  TREATMENT AND OBJECTIVE DATA SUMMARY:   Patient/Family Education:      Educated in skin care: [x]    Skin care products  []    Hygiene  [x]   Prevention of cellulitis  []    Wound care     Educated in exercise: []    Walking  program  []    Gertie ball routine  []    Stick routine  []    ROM routine     Instructed in self MLD:   Written sequence given and reviewed with patient as well as demonstration and instruction during MLD portion of the session.     Instructed in don/doff of compression system:   []    Multi layer bandage (MLB) donning principles and wear precautions  [x]    Day garments  [x]    Night garments       Therapeutic Exercise/Procedure : 9:08-9:20 12 minutes     Neural glides Demonstration by therapist of written routine including median nerve glides which are effective in treatment of AWS:  1. Shoulder abd paired with wrist extension, alternating elbow flex/ext holding 10 seconds at comfortable tension.  2. Shoulder abd paired with elbow extension, active wrist flex/ext maintaining fluid motion x 10.  3. Shoulder flexion paired with elbow extension, active wrist flex/ext maintaining fluid motion x 10.  Patient performed each x 10 reps.           Home program: Patient to perform daily to BID:  [x]    Skin care  [x]    Deep abdominal breathing  [x]    Exercise routine  []    Walking program  []    Rest in supine   []    Compression bandage  [x]    Compression garments  [x]    Vasopneumatic device: Flexitouch trial tomorrow  []    Wound care  [x]    Self MLD  [x]    Bring supplies to each therapy visit  []    Purchase necessary supplies  []    Weight loss program  []    Follow up with        Rationale: Exercise will increase the lymph angiomotoricity and tissue pressure of the skin and thus decrease swelling.     Manual Therapy: 8:38-8:50 am  Orthotic management: 8:51-9:06 12 minutes  15 minutes     Area to decongest:    [x]  UE          []   Right     [x]   Left      [x]  Trunk      []  LE             []   Right     []   Left      []  Trunk         Manual therapy: Limb volume measurements completed today with limb volume discrepancy of 11%, increased from 9.25%, potentially a result of persistent cellulitis.  Note lymphedema involvement L upper quadrant, with scarring L breast s/p mastectomy and reconstruction.  Note AWS persistent, with pain as noted above.  L shoulder flex/abd limited to 90 degrees today secondary to pain.    Reassessed measurements for daytime/night time compression garments ordered 03/06/2018, with measurements consistent with initial assessment.   Skin/wound care/debridement:    Upper extremity compression:  Orthotic management: Compression garments L UE  Upper Extremity [x]     Day []    Night []     Bilateral []     Assessed fit of Solaris Tribute ordered 07/2017, with good fit noted.  Patient in agreement today, stating she prefers to defer considered alteration with patient in agreement.    Spoke with [employer] medical rep with plan to complete pump trial in clinic tomorrow at 11:00               TOTAL TREATMENT 42 mins       ASSESSMENT:  Treatment effectiveness and tolerance: Note elevated limb volumes today, possible resultant from cellulitis episode, with patient continuing oral antibiotic.  No redness, warmth to touch noted L UE.  Patient advised in importance of skin care to reduce breaks in skin.  Awaiting recently ordered garments for fit in clinic upon receiving.  Note current limb volume discrepancy of 11%, with upper quadrant involvement, AWS, scarring, and pain noted.   Progress toward goals: Ongoing.     PLAN OF CARE:    Changes to the plan of care: [employer] medical rep to trial pump in clinic tomorrow.  Fit day/night time compression garments in clinic upon receiving.  Monitor lymphedema, assessing limb volume measurements.   Frequency: []   2 times a week  []   Weekly  []   Biweekly  []   prn           Kalman Shan, PT, CLT

## 2018-05-18 ENCOUNTER — Inpatient Hospital Stay: Payer: BLUE CROSS/BLUE SHIELD

## 2018-05-18 ENCOUNTER — Encounter: Attending: Specialist | Primary: Family Medicine

## 2018-05-18 ENCOUNTER — Encounter
Admit: 2018-05-18 | Discharge: 2018-05-18 | Payer: PRIVATE HEALTH INSURANCE | Attending: Specialist | Primary: Family Medicine

## 2018-05-18 MED ORDER — MIDAZOLAM 1 MG/ML IJ SOLN
1 mg/mL | INTRAMUSCULAR | Status: DC | PRN
Start: 2018-05-18 — End: 2018-05-18

## 2018-05-18 MED ORDER — LACTATED RINGERS IV
INTRAVENOUS | Status: DC
Start: 2018-05-18 — End: 2018-05-18

## 2018-05-18 MED ORDER — MUPIROCIN 2 % OINTMENT
2 % | CUTANEOUS | Status: DC | PRN
Start: 2018-05-18 — End: 2018-05-18
  Administered 2018-05-18: 12:00:00 via TOPICAL

## 2018-05-18 MED ORDER — BUPIVACAINE LIPOSOME (PF) 266 MG/20 ML (13.3 MG/ML) SUSP, INFILTRATION
1.3 % (13.3 mg/mL) | Status: DC | PRN
Start: 2018-05-18 — End: 2018-05-18
  Administered 2018-05-18: 12:00:00

## 2018-05-18 MED ORDER — PROPOFOL 10 MG/ML IV EMUL
10 mg/mL | INTRAVENOUS | Status: AC
Start: 2018-05-18 — End: ?

## 2018-05-18 MED ORDER — GLYCOPYRROLATE 0.2 MG/ML IJ SOLN
0.2 mg/mL | INTRAMUSCULAR | Status: DC | PRN
Start: 2018-05-18 — End: 2018-05-18
  Administered 2018-05-18: 12:00:00 via INTRAVENOUS

## 2018-05-18 MED ORDER — ACETAMINOPHEN 325 MG TABLET
325 mg | Freq: Once | ORAL | Status: AC
Start: 2018-05-18 — End: 2018-05-18
  Administered 2018-05-18: 11:00:00 via ORAL

## 2018-05-18 MED ORDER — ONDANSETRON (PF) 4 MG/2 ML INJECTION
4 mg/2 mL | INTRAMUSCULAR | Status: DC | PRN
Start: 2018-05-18 — End: 2018-05-18

## 2018-05-18 MED ORDER — LIDOCAINE (PF) 20 MG/ML (2 %) IJ SOLN
20 mg/mL (2 %) | INTRAMUSCULAR | Status: AC
Start: 2018-05-18 — End: ?

## 2018-05-18 MED ORDER — HYDROMORPHONE (PF) 1 MG/ML IJ SOLN
1 mg/mL | INTRAMUSCULAR | Status: DC | PRN
Start: 2018-05-18 — End: 2018-05-18

## 2018-05-18 MED ORDER — BUPIVACAINE (PF) 0.5 % (5 MG/ML) IJ SOLN
0.5 % (5 mg/mL) | INTRAMUSCULAR | Status: AC
Start: 2018-05-18 — End: ?

## 2018-05-18 MED ORDER — LACTATED RINGERS IV
INTRAVENOUS | Status: DC | PRN
Start: 2018-05-18 — End: 2018-05-18
  Administered 2018-05-18: 11:00:00 via INTRAVENOUS

## 2018-05-18 MED ORDER — SODIUM CHLORIDE 0.9 % IJ SYRG
Freq: Three times a day (TID) | INTRAMUSCULAR | Status: DC
Start: 2018-05-18 — End: 2018-05-18

## 2018-05-18 MED ORDER — PROPOFOL 10 MG/ML IV EMUL
10 mg/mL | INTRAVENOUS | Status: DC | PRN
Start: 2018-05-18 — End: 2018-05-18
  Administered 2018-05-18: 12:00:00 via INTRAVENOUS

## 2018-05-18 MED ORDER — LACTATED RINGERS IV
INTRAVENOUS | Status: DC
Start: 2018-05-18 — End: 2018-05-18
  Administered 2018-05-18: 11:00:00 via INTRAVENOUS

## 2018-05-18 MED ORDER — ONDANSETRON (PF) 4 MG/2 ML INJECTION
4 mg/2 mL | INTRAMUSCULAR | Status: AC
Start: 2018-05-18 — End: ?

## 2018-05-18 MED ORDER — PHENYLEPHRINE IN 0.9 % SODIUM CL (40 MCG/ML) IV SYRINGE
0.4 mg/10 mL (40 mcg/mL) | INTRAVENOUS | Status: AC
Start: 2018-05-18 — End: ?

## 2018-05-18 MED ORDER — SODIUM CHLORIDE 0.9 % IJ SYRG
INTRAMUSCULAR | Status: DC | PRN
Start: 2018-05-18 — End: 2018-05-18

## 2018-05-18 MED ORDER — LIDOCAINE (PF) 20 MG/ML (2 %) IJ SOLN
20 mg/mL (2 %) | INTRAMUSCULAR | Status: DC | PRN
Start: 2018-05-18 — End: 2018-05-18
  Administered 2018-05-18: 12:00:00 via INTRAVENOUS

## 2018-05-18 MED ORDER — LIDOCAINE (PF) 10 MG/ML (1 %) IJ SOLN
10 mg/mL (1 %) | INTRAMUSCULAR | Status: DC | PRN
Start: 2018-05-18 — End: 2018-05-18

## 2018-05-18 MED ORDER — PHENYLEPHRINE IN 0.9 % SODIUM CL (40 MCG/ML) IV SYRINGE
0.4 mg/10 mL (40 mcg/mL) | INTRAVENOUS | Status: DC | PRN
Start: 2018-05-18 — End: 2018-05-18
  Administered 2018-05-18 (×7): via INTRAVENOUS

## 2018-05-18 MED ORDER — OXYCODONE-ACETAMINOPHEN 5 MG-325 MG TAB
5-325 mg | ORAL | Status: DC | PRN
Start: 2018-05-18 — End: 2018-05-18
  Administered 2018-05-18: 14:00:00 via ORAL

## 2018-05-18 MED ORDER — FENTANYL CITRATE (PF) 50 MCG/ML IJ SOLN
50 mcg/mL | INTRAMUSCULAR | Status: AC
Start: 2018-05-18 — End: ?

## 2018-05-18 MED ORDER — FENTANYL CITRATE (PF) 50 MCG/ML IJ SOLN
50 mcg/mL | INTRAMUSCULAR | Status: DC | PRN
Start: 2018-05-18 — End: 2018-05-18
  Administered 2018-05-18 (×2): via INTRAVENOUS

## 2018-05-18 MED ORDER — ACETIC ACID (BULK) 5 % LIQUID
5 % | Status: DC | PRN
Start: 2018-05-18 — End: 2018-05-18
  Administered 2018-05-18: 12:00:00 via TOPICAL

## 2018-05-18 MED ORDER — FENTANYL CITRATE (PF) 50 MCG/ML IJ SOLN
50 mcg/mL | INTRAMUSCULAR | Status: DC | PRN
Start: 2018-05-18 — End: 2018-05-18

## 2018-05-18 MED ORDER — ONDANSETRON (PF) 4 MG/2 ML INJECTION
4 mg/2 mL | INTRAMUSCULAR | Status: DC | PRN
Start: 2018-05-18 — End: 2018-05-18
  Administered 2018-05-18: 12:00:00 via INTRAVENOUS

## 2018-05-18 MED ORDER — MORPHINE 2 MG/ML INJECTION
2 mg/mL | INTRAMUSCULAR | Status: DC | PRN
Start: 2018-05-18 — End: 2018-05-18

## 2018-05-18 MED ORDER — GLYCOPYRROLATE(PF) 0.4 MG/2 ML (0.2 MG/ML) IN STERILE WATER IV SYRINGE
0.4 mg/2 mL (0.2 mg/mL) | INTRAVENOUS | Status: AC
Start: 2018-05-18 — End: ?

## 2018-05-18 MED ORDER — DIPHENHYDRAMINE HCL 50 MG/ML IJ SOLN
50 mg/mL | INTRAMUSCULAR | Status: DC | PRN
Start: 2018-05-18 — End: 2018-05-18

## 2018-05-18 MED ORDER — FENTANYL CITRATE (PF) 50 MCG/ML IJ SOLN
50 mcg/mL | INTRAMUSCULAR | Status: DC | PRN
Start: 2018-05-18 — End: 2018-05-18
  Administered 2018-05-18 (×3): via INTRAVENOUS

## 2018-05-18 MED ORDER — DEXAMETHASONE SODIUM PHOSPHATE 4 MG/ML IJ SOLN
4 mg/mL | INTRAMUSCULAR | Status: AC
Start: 2018-05-18 — End: ?

## 2018-05-18 MED ORDER — MIDAZOLAM 1 MG/ML IJ SOLN
1 mg/mL | INTRAMUSCULAR | Status: DC | PRN
Start: 2018-05-18 — End: 2018-05-18
  Administered 2018-05-18: 11:00:00 via INTRAVENOUS

## 2018-05-18 MED ORDER — DEXMEDETOMIDINE 100 MCG/ML IV SOLN
100 mcg/mL | INTRAVENOUS | Status: DC | PRN
Start: 2018-05-18 — End: 2018-05-18
  Administered 2018-05-18 (×2): via INTRAVENOUS

## 2018-05-18 MED ORDER — DEXAMETHASONE SODIUM PHOSPHATE 4 MG/ML IJ SOLN
4 mg/mL | INTRAMUSCULAR | Status: DC | PRN
Start: 2018-05-18 — End: 2018-05-18
  Administered 2018-05-18: 12:00:00 via INTRAVENOUS

## 2018-05-18 MED ORDER — PHENYLEPHRINE 10 MG/250 ML INFUSION
10 mg/250 mL (40 mcg/mL) | INTRAVENOUS | Status: AC
Start: 2018-05-18 — End: ?

## 2018-05-18 MED ORDER — OXYCODONE-ACETAMINOPHEN 5 MG-325 MG TAB
5-325 mg | ORAL_TABLET | ORAL | 0 refills | Status: AC | PRN
Start: 2018-05-18 — End: 2018-05-21

## 2018-05-18 MED ORDER — ROCURONIUM 10 MG/ML IV
10 mg/mL | INTRAVENOUS | Status: AC
Start: 2018-05-18 — End: ?

## 2018-05-18 MED ORDER — BUPIVACAINE LIPOSOME (PF) 266 MG/20 ML (13.3 MG/ML) SUSP, INFILTRATION
1.3 % (13.3 mg/mL) | Status: AC
Start: 2018-05-18 — End: ?

## 2018-05-18 MED ORDER — SODIUM CHLORIDE 0.9 % IV
INTRAVENOUS | Status: DC
Start: 2018-05-18 — End: 2018-05-18

## 2018-05-18 MED ORDER — MIDAZOLAM 1 MG/ML IJ SOLN
1 mg/mL | INTRAMUSCULAR | Status: AC
Start: 2018-05-18 — End: ?

## 2018-05-18 MED FILL — DIPRIVAN 10 MG/ML INTRAVENOUS EMULSION: 10 mg/mL | INTRAVENOUS | Qty: 40

## 2018-05-18 MED FILL — OXYCODONE-ACETAMINOPHEN 5 MG-325 MG TAB: 5-325 mg | ORAL | Qty: 1

## 2018-05-18 MED FILL — MIDAZOLAM 1 MG/ML IJ SOLN: 1 mg/mL | INTRAMUSCULAR | Qty: 2

## 2018-05-18 MED FILL — GLYCOPYRROLATE(PF) 0.4 MG/2 ML (0.2 MG/ML) IN STERILE WATER IV SYRINGE: 0.4 mg/2 mL (0.2 mg/mL) | INTRAVENOUS | Qty: 2

## 2018-05-18 MED FILL — FENTANYL CITRATE (PF) 50 MCG/ML IJ SOLN: 50 mcg/mL | INTRAMUSCULAR | Qty: 2

## 2018-05-18 MED FILL — ACETAMINOPHEN 325 MG TABLET: 325 mg | ORAL | Qty: 2

## 2018-05-18 MED FILL — ONDANSETRON (PF) 4 MG/2 ML INJECTION: 4 mg/2 mL | INTRAMUSCULAR | Qty: 2

## 2018-05-18 MED FILL — PHENYLEPHRINE 10 MG/250 ML INFUSION: 10 mg/250 mL (40 mcg/mL) | INTRAVENOUS | Qty: 250

## 2018-05-18 MED FILL — SENSORCAINE-MPF 0.5 % (5 MG/ML) INJECTION SOLUTION: 0.5 % (5 mg/mL) | INTRAMUSCULAR | Qty: 30

## 2018-05-18 MED FILL — LACTATED RINGERS IV: INTRAVENOUS | Qty: 1000

## 2018-05-18 MED FILL — EXPAREL (PF) 1.3 % (13.3 MG/ML) SUSPENSION FOR LOCAL INFILTRATION: 1.3 % (13.3 mg/mL) | Qty: 20

## 2018-05-18 MED FILL — PHENYLEPHRINE IN 0.9 % SODIUM CL (40 MCG/ML) IV SYRINGE: 0.4 mg/10 mL (40 mcg/mL) | INTRAVENOUS | Qty: 10

## 2018-05-18 MED FILL — XYLOCAINE-MPF 20 MG/ML (2 %) INJECTION SOLUTION: 20 mg/mL (2 %) | INTRAMUSCULAR | Qty: 5

## 2018-05-18 MED FILL — ROCURONIUM 10 MG/ML IV: 10 mg/mL | INTRAVENOUS | Qty: 5

## 2018-05-18 MED FILL — DEXAMETHASONE SODIUM PHOSPHATE 4 MG/ML IJ SOLN: 4 mg/mL | INTRAMUSCULAR | Qty: 2

## 2018-05-18 NOTE — Anesthesia Pre-Procedure Evaluation (Signed)
Anesthetic History   No history of anesthetic complications  PONV          Review of Systems / Medical History  Patient summary reviewed, nursing notes reviewed and pertinent labs reviewed    Pulmonary  Within defined limits                 Neuro/Psych   Within defined limits           Cardiovascular  Within defined limits                     GI/Hepatic/Renal  Within defined limits              Endo/Other  Within defined limits      Arthritis and cancer     Other Findings              Physical Exam    Airway  Mallampati: II  TM Distance: > 6 cm  Neck ROM: normal range of motion   Mouth opening: Normal     Cardiovascular  Regular rate and rhythm,  S1 and S2 normal,  no murmur, click, rub, or gallop             Dental  No notable dental hx       Pulmonary  Breath sounds clear to auscultation               Abdominal  GI exam deferred       Other Findings            Anesthetic Plan    ASA: 2  Anesthesia type: general          Induction: Intravenous  Anesthetic plan and risks discussed with: Patient

## 2018-05-18 NOTE — Op Note (Signed)
BRIEF OPERATIVE NOTE    Date of Procedure: 05/18/2018   Preoperative Diagnosis: VULVAR DYSPLASIA, VIN III  Postoperative Diagnosis: VULVAR, DYSPLASIA, VIN III    Procedure(s):  SIMPLE/PARTIAL VULVECTOMY  Surgeon(s) and Role:     Albertha Ghee, MD - Primary         Surgical Assistant: None    Surgical Staff:  Circ-1: Leanord Asal, RN  Scrub Tech-1: Oneita Kras  Float Staff: Delfino Lovett, RN  Event Time In Time Out   Incision Start 0745    Incision Close 0810      Anesthesia: General   Estimated Blood Loss: 5 cc  Specimens:   ID Type Source Tests Collected by Time Destination   1 : RIGHT UPPER VULVA, STITCH AT 12 O CLOCK Fresh Vagina  Albertha Ghee, MD 05/18/2018 [phone] Pathology      Findings: Small, flat, plaque on the right upper vulva, just lateral to the fold of the labia minora.  Small, raised, "skin tag" just above and lateral.  Both were excised completely and I was able to salvage most of the labia minora.  Complications: None  Implants: * No implants in log *    Albertha Ghee, MD

## 2018-05-18 NOTE — H&P (Signed)
H&P by Albertha Ghee, MD at  05/18/18 [phone]                Author: Albertha Ghee, MD  Service: Gynecologic Oncology  Author Type: Physician       Filed: 05/18/18 0706  Date of Service: 05/18/18 0705  Status: Signed          Editor: Albertha Ghee, MD (Physician)               [facility]   [address] [place] [address]   P [phone]   F [phone]            Patient ID:   Name:  Emily [name]   MRN:  [id number]   DOB:  1951-08-27/66 y.o.   Date:  05/18/2018         HISTORY OF PRESENT ILLNESS:   Emily [name] is a 67 y.o. Caucasian postmenopausal female who is being seen for a second opinion for vulvar dysplasia.  She is a patient of Dr. Crissie Sickles.  Two separate biopsies of the right labia minora revealed high-grade squamous intraepithelial  lesion, severe vulvar dysplasia, or VIN 3.  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  He has asked that I see her for consultation.           ROS:   GU and GI review:  Negative   Cardiopulmonary review:  Negative    Musculoskeletal:  Negative      A comprehensive review of systems was negative except for that written in the History of Present Illness., 10 point ROS         OB/GYN ROS:   There is no history of significant gyn problems or procedures.   Patient denies any abnormal bleeding or vaginal discharge.          Problem List:     Patient Active Problem List           Diagnosis  Date Noted         ?  VIN III (vulvar intraepithelial neoplasia III)  03/22/2018     ?  Lymphedema of left arm  08/21/2015     ?  Pheochromocytoma  08/21/2015     ?  Arthritis of both knees  08/21/2015     ?  Intractable chronic paroxysmal hemicrania  08/21/2015     ?  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  Breast cancer, stage 2 ([place])  10/22/2012        PMH:     Past Medical History:        Diagnosis  Date         ?  Anxiety       ?  Arthritis       ?  Basal cell  carcinoma       ?  Cancer [facility])            left breast  cancer         ?  Chronic tension headaches       ?  Concussion  2011     ?  Headache(784.0)            OCCAS         ?  Hx of radiation therapy  COMPLETED IN 2014         ?  Ill-defined condition            cellulitis L UE         ?  Lymphedema of arm            left         ?  Nausea & vomiting       ?  Other and unspecified symptoms and signs involving general sensations and perceptions            HIVES RECENTLY- WAS IN ER Feb 19, 2013         ?  Other ill-defined conditions(799.89)            PHEOCHROMOCYTOMA         ?  S/P chemotherapy, time since greater than 12 weeks            COMPLETED CHEMOTHERAPY IN JUNE 2014         ?  SCCA (squamous cell carcinoma) of skin           PSH:     Past Surgical History:         Procedure  Laterality  Date          ?  ABDOMEN SURGERY PROC UNLISTED              hernia mesh repair          ?  ABDOMEN SURGERY PROC UNLISTED              right adenal removed          ?  BREAST SURGERY PROCEDURE UNLISTED    '91          breast implants; replaced bilat implants 2011          ?  HX APPENDECTOMY         ?  HX BREAST AUGMENTATION  Left  04/22/2014          REMOVAL OF LEFT BREAST IMPLANT, WASHOUT AND CLOSURE OF LEFT BREAST WOUND performed by Sharlotte Alamo, MD at [place] [facility]          ?  HX BREAST RECONSTRUCTION    11/28/2012          BREAST RECONSTRUCTION /C  INSERTION EXPANDER & ALLODERM performed by Sharlotte Alamo, MD at [facility]          ?  HX BREAST RECONSTRUCTION  Bilateral  09/16/2013          REMOVAL AND REPLACEMENT LEFT BREAST IMPLANT TISSUE EXPANDERS WITH SUBTOTAL CAPSULECTOMY, RIGHT BREAST IMPLANT REMOVAL AND REPLACEMENT FOR SYMMETRY/FAT  GRAFTING TO BILATERAL BREASTS performed by Sharlotte Alamo, MD at [place] [facility]          ?  HX GYN              c-section          ?  HX HEENT              T&A          ?  HX HEENT              right adrenal removed          ?  HX MASTECTOMY  Left       ?  HX MODIFIED  RADICAL MASTECTOMY    11/28/2012          LEFT BREAST  MODIFIED RADICAL MASTECTOMY WITH AXILLARY DISSECTION W/ RECONSTRUCTION LEFT BREAST WITH TISSUE EXPANDER AND ALLODERM;PORTACATH INSERTION  performed by Ty Hilts, MD at [place]          ?  HX OOPHORECTOMY              right          ?  HX ORTHOPAEDIC              bilateral shoulder arthroscopy          ?  HX VASCULAR ACCESS              PORT, THEN REMOVED         Social History:     Social History          Tobacco Use         ?  Smoking status:  Never Smoker     ?  Smokeless tobacco:  Never Used       Substance Use Topics         ?  Alcohol use:  No         Family History:     Family History         Problem  Relation  Age of Onset          ?  Alzheimer  Mother       ?  Diabetes  Mother       ?  Other  Mother                COMBATIVE POST ANESTHESIA          ?  Heart Disease  Father       ?  Diabetes  Father       ?  Lung Disease  Father                COPD          ?  Headache  Sister       ?  Migraines  Sister            ?  Anesth Problems  Neg Hx           Medications: (reviewed)     Current Facility-Administered Medications          Medication  Dose  Route  Frequency           ?  lactated Ringers infusion   125 mL/hr  IntraVENous  CONTINUOUS     ?  0.9% sodium chloride infusion   25 mL/hr  IntraVENous  CONTINUOUS     ?  sodium chloride (NS) flush 5-40 mL   5-40 mL  IntraVENous  Q8H     ?  sodium chloride (NS) flush 5-40 mL   5-40 mL  IntraVENous  PRN     ?  lidocaine (PF) (XYLOCAINE) 10 mg/mL (1 %) injection 0.1 mL   0.1 mL  SubCUTAneous  PRN     ?  fentaNYL citrate (PF) injection 50 mcg   50 mcg  IntraVENous  PRN           ?  midazolam (VERSED) injection 1 mg   1 mg  IntraVENous  PRN        Allergies: (reviewed)     Allergies        Allergen  Reactions         ?  Aspirin  Unknown (comments)  Gi bleeding         ?  Bacitracin  Hives and Swelling     ?  Compazine [Prochlorperazine]  Rash     ?  Keflex [Cephalexin]  Rash     ?   Nsaids (Non-Steroidal Anti-Inflammatory Drug)  Swelling             Throat swells               OBJECTIVE:      Physical Exam:      VITAL SIGNS:  Vitals:        03/28/18 1542  05/18/18 0644      BP:    110/65      Pulse:    64      Resp:    18      Temp:    98.2 ??F (36.8 ??C)      SpO2:    99%      Weight:  120 lb (54.4 kg)  120 lb (54.4 kg)      Height:  5' 5.5" (1.664 m)  5' 5.5" (1.664 m)            Body mass index is 19.67 kg/m??.        GENERAL APP:  Conversant, alert, oriented. No acute distress.     HEENT:  HEENT. No thyroid enlargement. No JVD.    Neck: Supple without restrictions.     RESPIRATORY:  Clear to auscultation and percussion to the bases. No CVAT.     CARDIOVASC:  RRR without murmur/rub.     GASTROINT:  soft, non-tender, without masses or organomegaly     MUSCULOSKEL:  no joint tenderness, deformity or swelling        EXTREMITIES:  extremities normal, atraumatic, no cyanosis or edema        PELVIC:                  RECTAL:  Deferred        NODAL SURVEY:  No suspicious lymphadenopathy or edema noted.        NEURO:  Grossly intact. No acute deficit.           Lab Data:        Lab Results         Component  Value  Date/Time            WBC  3.5  10/30/2015 09:13 AM       HGB  13.2  10/30/2015 09:13 AM       HCT  40.3  10/30/2015 09:13 AM       PLATELET  214  10/30/2015 09:13 AM            MCV  90  10/30/2015 09:13 AM          Lab Results         Component  Value  Date/Time            Sodium  140  10/30/2015 09:13 AM       Potassium  3.9  10/30/2015 09:13 AM       Chloride  100  10/30/2015 09:13 AM       CO2  25  10/30/2015 09:13 AM       Anion gap  10  02/05/2013 09:44 AM       Glucose  79  10/30/2015 09:13 AM       BUN  7 (L)  10/30/2015 09:13 AM  Creatinine  0.49 (L)  10/30/2015 09:13 AM       BUN/Creatinine ratio  14  10/30/2015 09:13 AM       GFR est AA  121  10/30/2015 09:13 AM       GFR est non-AA  105  10/30/2015 09:13 AM            Calcium  8.2 (L)  10/30/2015 09:13 AM               Imaging:   None available         IMPRESSION/PLAN:   Emily [name] is a 67 y.o. female with a working diagnosis of VIN 3.  I reviewed with Emily [name] her medical records, physical exam, and review of symptoms.   I discussed the pathology with her and explained the need for surgical excision of the lesion.  Since it is a precancerous lesion, the resection margins do not need to be radical.  A wide local excision or simple partial vulvectomy should be all that's  required.  I would recommend excising the skin tag along with the lesion, as they are immediately adjacent to one another.  She was counseled on the risks, benefits, indications, and alternatives of surgery.  Her questions were answered and she wishes  to proceed as planned.            Signed By:  Albertha Ghee, MD           05/18/2018/10:07 AM           Date of Surgery Update:   Emily [name] was seen and examined.   History and physical has been reviewed. The patient has been examined. There have been no significant clinical changes since the completion of the originally dated History and  Physical.         Signed By:  Albertha Ghee, MD        May 18, 2018 7:06 AM

## 2018-05-18 NOTE — Op Note (Signed)
Gynecologic Oncology Operative Report    NEKA BISE    Pre-operative dx: Vulvar dysplaia, VIN 3    Post-operative dx: Vulvar dysplasia, VIN 3    Procedure:  Simple partial vulvectomy    Surgeon:  Albertha Ghee, MD    Assistant: None     Anesthesia:  GETA    EBL:  5 cc    Complications:  None    Implants:  None    Specimens:   ID Type Source Tests Collected by Time Destination   1 : RIGHT UPPER VULVA, STITCH AT 12 O CLOCK Fresh Vagina ?? Albertha Ghee, MD 05/18/2018 [phone] Pathology     Operative indications: 67 yo WF with biopsy proven VIN 3 of the right upper vulva.  I recommended surgical excision of the lesion due it's precancerous nature.     Operative findings: Small, flat, plaque on the right upper vulva, just lateral to the fold of the labia minora.  Small, raised, "skin tag" just above and lateral.  Both were excised completely and I was able to salvage most of the labia minora.     Procedure in detail:  After the risks, benefits, indications, and alternatives of the procedure were discussed with the patient and informed consent was obtained, the patient was taken to the operating room.  She was positively identified, administered general anesthesia, and then placed in the dorsal lithotomy position in [place].  An exam under anesthesia was performed.  She was then prepped and draped in the usual fashion.         4% acetic acid was then applied to the vulva to help identify any dysplastic areas.  The area of concern on the right upper vulva did exhibit some acetowhite changes.  The clinically visible margins were then identified.  A marking pen was used to demarcate a the area to excise, following the natural skin folds to help hide the scar.  The medial aspect of the margin of excision extended partially on to the lateral aspect of the labia minora.  A #15 blade scalpel was then used to incise along the demarcated line.  Needle point cautery was then used to complete the dissection underneath the area  to be excised.  Once free, the excised area was labeled at 12 o'clock with a silk suture for pathology orientation.  The bed of the excision was made hemostatic with cautery.  Exparel mixed with marcaine was then injected into the surrounding tissues.  The subcutaneous tissues were then approximated with interrupted 2-0 Vicryl sutures.  The skin was then closed with a running 4-0 Monocryl.  Mupirocin ointment was then applied liberally along the incision.    The patient was taken out of stirrups, awakened from anesthesia, and taken to the recovery room in stable condition.  All instrument, sponge, and needle counts were correct.        Albertha Ghee, MD  05/18/2018  8:15 AM

## 2018-05-18 NOTE — Anesthesia Post-Procedure Evaluation (Signed)
Procedure(s):  SIMPLE/PARTIAL VULVECTOMY.    general    Anesthesia Post Evaluation        Patient location during evaluation: PACU  Patient participation: complete - patient participated  Level of consciousness: awake  Pain score: 0  Pain management: adequate  Airway patency: patent  Anesthetic complications: no  Cardiovascular status: hemodynamically stable  Respiratory status: acceptable  Hydration status: acceptable  Comments: I have seen and evaluated the patient. The patient is ready for PACU discharge.  [place], DO                         Vitals Value Taken Time   BP 94/58 05/18/2018  8:30 AM   Temp 36.6 ??C (97.9 ??F) 05/18/2018  8:20 AM   Pulse 80 05/18/2018  8:43 AM   Resp 14 05/18/2018  8:43 AM   SpO2 98 % 05/18/2018  8:43 AM   Vitals shown include unvalidated device data.

## 2018-05-18 NOTE — Brief Op Note (Signed)
BRIEF OPERATIVE NOTE    Date of Procedure: 05/18/2018   Preoperative Diagnosis: VULVAR DYSPLASIA, VIN III  Postoperative Diagnosis: VULVAR, DYSPLASIA, VIN III    Procedure(s):  SIMPLE/PARTIAL VULVECTOMY  Surgeon(s) and Role:     Albertha Ghee, MD - Primary         Surgical Assistant: None    Surgical Staff:  Circ-1: Leanord Asal, RN  Scrub Tech-1: Oneita Kras  Float Staff: Delfino Lovett, RN  Event Time In Time Out   Incision Start 0745    Incision Close 0810      Anesthesia: General   Estimated Blood Loss: 5 cc  Specimens:   ID Type Source Tests Collected by Time Destination   1 : RIGHT UPPER VULVA, STITCH AT 12 O CLOCK Fresh Vagina  Albertha Ghee, MD 05/18/2018 [phone] Pathology      Findings: Small, flat, plaque on the right upper vulva, just lateral to the fold of the labia minora.  Small, raised, "skin tag" just above and lateral.  Both were excised completely and I was able to salvage most of the labia minora.  Complications: None  Implants: * No implants in log *    Albertha Ghee, MD

## 2018-05-18 NOTE — H&P (Signed)
[facility], [place] [address]  P [phone]   F [phone]        Patient ID:  Name:  Emily [name]  MRN:  [id number]  DOB:  07-Feb-1951/67 y.o.  Date:  05/18/2018      HISTORY OF PRESENT ILLNESS:  Emily [name] is a 67 y.o. Caucasian postmenopausal female who is being seen for a second opinion for vulvar dysplasia.  She is a patient of Dr. Crissie Sickles.  Two separate biopsies of the right labia minora revealed high-grade squamous intraepithelial lesion, severe vulvar dysplasia, or VIN 3.  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  He has asked that I see her for consultation.        ROS:  GU and GI review:  Negative  Cardiopulmonary review:  Negative   Musculoskeletal:  Negative    A comprehensive review of systems was negative except for that written in the History of Present Illness., 10 point ROS      OB/GYN ROS:  There is no history of significant gyn problems or procedures.  Patient denies any abnormal bleeding or vaginal discharge.       Problem List:  Patient Active Problem List    Diagnosis Date Noted   ??? VIN III (vulvar intraepithelial neoplasia III) 03/22/2018   ??? Lymphedema of left arm 08/21/2015   ??? Pheochromocytoma 08/21/2015   ??? Arthritis of both knees 08/21/2015   ??? 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 Malignant neoplasm of lower-inner quadrant of left female breast ([facility]) 07/24/2015   ??? Breast cancer, stage 2 ([facility]) 10/22/2012     PMH:  Past Medical History:   Diagnosis Date   ??? Anxiety    ??? Arthritis    ??? Basal cell carcinoma    ??? Cancer ([facility])     left breast  cancer   ??? Chronic tension headaches    ??? Concussion 2011   ??? Headache(784.0)     OCCAS   ??? Hx of radiation therapy     COMPLETED IN 2014   ??? Ill-defined condition     cellulitis L UE   ??? Lymphedema of arm     left   ??? Nausea & vomiting    ??? Other and unspecified symptoms and signs involving general sensations and perceptions      HIVES RECENTLY- WAS IN ER Feb 19, 2013   ??? Other ill-defined conditions(799.89)     PHEOCHROMOCYTOMA   ??? S/P chemotherapy, time since greater than 12 weeks     COMPLETED CHEMOTHERAPY IN JUNE 2014   ??? SCCA (squamous cell carcinoma) of skin       PSH:  Past Surgical History:   Procedure Laterality Date   ??? ABDOMEN SURGERY PROC UNLISTED      hernia mesh repair   ??? ABDOMEN SURGERY PROC UNLISTED      right adenal removed   ??? BREAST SURGERY PROCEDURE UNLISTED  '91    breast implants; replaced bilat implants 2011   ??? HX APPENDECTOMY     ??? HX BREAST AUGMENTATION Left 04/22/2014    REMOVAL OF LEFT BREAST IMPLANT, WASHOUT AND CLOSURE OF LEFT BREAST WOUND performed by Sharlotte Alamo, MD at [facility]   ??? HX BREAST RECONSTRUCTION  11/28/2012    BREAST RECONSTRUCTION /C  INSERTION EXPANDER & ALLODERM performed by Sharlotte Alamo, MD at [facility]   ???  HX BREAST RECONSTRUCTION Bilateral 09/16/2013    REMOVAL AND REPLACEMENT LEFT BREAST IMPLANT TISSUE EXPANDERS WITH SUBTOTAL CAPSULECTOMY, RIGHT BREAST IMPLANT REMOVAL AND REPLACEMENT FOR SYMMETRY/FAT GRAFTING TO BILATERAL BREASTS performed by Sharlotte Alamo, MD at [facility]   ??? HX GYN      c-section   ??? HX HEENT      T&A   ??? HX HEENT      right adrenal removed   ??? HX MASTECTOMY Left    ??? HX MODIFIED RADICAL MASTECTOMY  11/28/2012    LEFT BREAST MODIFIED RADICAL MASTECTOMY WITH AXILLARY DISSECTION W/ RECONSTRUCTION LEFT BREAST WITH TISSUE EXPANDER AND ALLODERM;PORTACATH INSERTION performed by Ty Hilts, MD at [facility]   ??? HX OOPHORECTOMY      right   ??? HX ORTHOPAEDIC      bilateral shoulder arthroscopy   ??? HX VASCULAR ACCESS      PORT, THEN REMOVED      Social History:  Social History     Tobacco Use   ??? Smoking status: Never Smoker   ??? Smokeless tobacco: Never Used   Substance Use Topics   ??? Alcohol use: No      Family History:  Family History   Problem Relation Age of Onset   ??? Alzheimer Mother    ??? Diabetes Mother    ??? Other Mother          COMBATIVE POST ANESTHESIA   ??? Heart Disease Father    ??? Diabetes Father    ??? Lung Disease Father         COPD   ??? Headache Sister    ??? Migraines Sister    ??? Anesth Problems Neg Hx       Medications: (reviewed)  Current Facility-Administered Medications   Medication Dose Route Frequency   ??? lactated Ringers infusion  125 mL/hr IntraVENous CONTINUOUS   ??? 0.9% sodium chloride infusion  25 mL/hr IntraVENous CONTINUOUS   ??? sodium chloride (NS) flush 5-40 mL  5-40 mL IntraVENous Q8H   ??? sodium chloride (NS) flush 5-40 mL  5-40 mL IntraVENous PRN   ??? lidocaine (PF) (XYLOCAINE) 10 mg/mL (1 %) injection 0.1 mL  0.1 mL SubCUTAneous PRN   ??? fentaNYL citrate (PF) injection 50 mcg  50 mcg IntraVENous PRN   ??? midazolam (VERSED) injection 1 mg  1 mg IntraVENous PRN     Allergies: (reviewed)  Allergies   Allergen Reactions   ??? Aspirin Unknown (comments)     Gi bleeding   ??? Bacitracin Hives and Swelling   ??? Compazine [Prochlorperazine] Rash   ??? Keflex [Cephalexin] Rash   ??? Nsaids (Non-Steroidal Anti-Inflammatory Drug) Swelling     Throat swells          OBJECTIVE:    Physical Exam:  VITAL SIGNS: Vitals:    03/28/18 1542 05/18/18 0644   BP:  110/65   Pulse:  64   Resp:  18   Temp:  98.2 ??F (36.8 ??C)   SpO2:  99%   Weight: 120 lb (54.4 kg) 120 lb (54.4 kg)   Height: 5' 5.5" (1.664 m) 5' 5.5" (1.664 m)     Body mass index is 19.67 kg/m??.   GENERAL APP: Conversant, alert, oriented. No acute distress.   HEENT: HEENT. No thyroid enlargement. No JVD.   Neck: Supple without restrictions.   RESPIRATORY: Clear to auscultation and percussion to the bases. No CVAT.   CARDIOVASC: RRR without murmur/rub.   GASTROINT:  soft, non-tender, without masses or organomegaly   MUSCULOSKEL: no joint tenderness, deformity or swelling   EXTREMITIES: extremities normal, atraumatic, no cyanosis or edema   PELVIC:        RECTAL: Deferred   NODAL SURVEY: No suspicious lymphadenopathy or edema noted.   NEURO: Grossly intact. No acute deficit.       Lab Data:     Lab Results   Component Value Date/Time    WBC 3.5 10/30/2015 09:13 AM    HGB 13.2 10/30/2015 09:13 AM    HCT 40.3 10/30/2015 09:13 AM    PLATELET 214 10/30/2015 09:13 AM    MCV 90 10/30/2015 09:13 AM     Lab Results   Component Value Date/Time    Sodium 140 10/30/2015 09:13 AM    Potassium 3.9 10/30/2015 09:13 AM    Chloride 100 10/30/2015 09:13 AM    CO2 25 10/30/2015 09:13 AM    Anion gap 10 02/05/2013 09:44 AM    Glucose 79 10/30/2015 09:13 AM    BUN 7 (L) 10/30/2015 09:13 AM    Creatinine 0.49 (L) 10/30/2015 09:13 AM    BUN/Creatinine ratio 14 10/30/2015 09:13 AM    GFR est AA 121 10/30/2015 09:13 AM    GFR est non-AA 105 10/30/2015 09:13 AM    Calcium 8.2 (L) 10/30/2015 09:13 AM         Imaging:  None available      IMPRESSION/PLAN:  Emily [name] is a 67 y.o. female with a working diagnosis of VIN 3.  I reviewed with Emily [name] her medical records, physical exam, and review of symptoms.  I discussed the pathology with her and explained the need for surgical excision of the lesion.  Since it is a precancerous lesion, the resection margins do not need to be radical.  A wide local excision or simple partial vulvectomy should be all that's required.  I would recommend excising the skin tag along with the lesion, as they are immediately adjacent to one another.  She was counseled on the risks, benefits, indications, and alternatives of surgery.  Her questions were answered and she wishes to proceed as planned.      Signed By: Albertha Ghee, MD     05/18/2018/10:07 AM       Date of Surgery Update:  Emily [name] was seen and examined.  History and physical has been reviewed. The patient has been examined. There have been no significant clinical changes since the completion of the originally dated History and Physical.    Signed By: Albertha Ghee, MD     May 18, 2018 7:06 AM

## 2018-05-18 NOTE — Anesthesia Post-Procedure Evaluation (Signed)
Procedure(s):  SIMPLE/PARTIAL VULVECTOMY.    general    Anesthesia Post Evaluation        Patient location during evaluation: PACU  Patient participation: complete - patient participated  Level of consciousness: awake  Pain score: 0  Pain management: adequate  Airway patency: patent  Anesthetic complications: no  Cardiovascular status: hemodynamically stable  Respiratory status: acceptable  Hydration status: acceptable  Comments: I have seen and evaluated the patient. The patient is ready for PACU discharge.  [place], DO                         Vitals Value Taken Time   BP 94/58 05/18/2018  8:30 AM   Temp 36.6 ??C (97.9 ??F) 05/18/2018  8:20 AM   Pulse 80 05/18/2018  8:43 AM   Resp 14 05/18/2018  8:43 AM   SpO2 98 % 05/18/2018  8:43 AM   Vitals shown include unvalidated device data.

## 2018-05-18 NOTE — Op Note (Signed)
Gynecologic Oncology Operative Report    Emily [name]    Pre-operative dx: Vulvar dysplaia, VIN 3    Post-operative dx: Vulvar dysplasia, VIN 3    Procedure:  Simple partial vulvectomy    Surgeon:  Albertha Ghee, MD    Assistant: None     Anesthesia:  GETA    EBL:  5 cc    Complications:  None    Implants:  None    Specimens:   ID Type Source Tests Collected by Time Destination   1 : RIGHT UPPER VULVA, STITCH AT 12 O CLOCK Fresh Vagina ?? Albertha Ghee, MD 05/18/2018 [phone] Pathology     Operative indications: 67 yo WF with biopsy proven VIN 3 of the right upper vulva.  I recommended surgical excision of the lesion due it's precancerous nature.     Operative findings: Small, flat, plaque on the right upper vulva, just lateral to the fold of the labia minora.  Small, raised, "skin tag" just above and lateral.  Both were excised completely and I was able to salvage most of the labia minora.     Procedure in detail:  After the risks, benefits, indications, and alternatives of the procedure were discussed with the patient and informed consent was obtained, the patient was taken to the operating room.  She was positively identified, administered general anesthesia, and then placed in the dorsal lithotomy position in [place].  An exam under anesthesia was performed.  She was then prepped and draped in the usual fashion.         4% acetic acid was then applied to the vulva to help identify any dysplastic areas.  The area of concern on the right upper vulva did exhibit some acetowhite changes.  The clinically visible margins were then identified.  A marking pen was used to demarcate a the area to excise, following the natural skin folds to help hide the scar.  The medial aspect of the margin of excision extended partially on to the lateral aspect of the labia minora.  A #15 blade scalpel was then used to incise along the demarcated line.  Needle point cautery was then used to complete the  dissection underneath the area to be excised.  Once free, the excised area was labeled at 12 o'clock with a silk suture for pathology orientation.  The bed of the excision was made hemostatic with cautery.  Exparel mixed with marcaine was then injected into the surrounding tissues.  The subcutaneous tissues were then approximated with interrupted 2-0 Vicryl sutures.  The skin was then closed with a running 4-0 Monocryl.  Mupirocin ointment was then applied liberally along the incision.    The patient was taken out of stirrups, awakened from anesthesia, and taken to the recovery room in stable condition.  All instrument, sponge, and needle counts were correct.        Albertha Ghee, MD  05/18/2018  8:15 AM

## 2018-05-21 LAB — POC HEMOGLOBIN
Hemoglobin (POC): 12.8 g/dL (ref 11.5–16.0)
Hemoglobin, POC: 12.8 g/dL (ref 11.5–16.0)

## 2018-06-11 NOTE — Telephone Encounter (Signed)
Patient called and stated that she would like a prescription for a prosthetic bra. Patient would like to know if the prescription can be mailed to her. CB# [phone]

## 2018-06-12 ENCOUNTER — Encounter

## 2018-06-12 ENCOUNTER — Encounter: Attending: Nurse Practitioner | Primary: Family Medicine

## 2018-06-12 MED ORDER — OTHER
1 refills | Status: DC
Start: 2018-06-12 — End: 2018-11-23

## 2018-06-12 NOTE — Telephone Encounter (Addendum)
06/12/18 11:09 AM: Returned call to patient to confirm that a prescription for a mastectomy bra can be mailed to her. Confirmed patient's address and her next appointment date of 08/15/18 at 8:30 AM. Patient denied further questions or concerns at this time.     06/12/18 2:26 PM: Order for mastectomy bra mailed to patient as requested.

## 2018-06-20 ENCOUNTER — Encounter: Primary: Family Medicine

## 2018-06-21 ENCOUNTER — Ambulatory Visit: Attending: Specialist

## 2018-06-21 ENCOUNTER — Ambulatory Visit: Admit: 2018-06-21 | Payer: PRIVATE HEALTH INSURANCE | Attending: Specialist | Primary: Family Medicine

## 2018-06-21 DIAGNOSIS — D071 Carcinoma in situ of vulva: Secondary | ICD-10-CM

## 2018-06-21 NOTE — Progress Notes (Signed)
[address], [place] [address]  P [phone]   F [phone]        Patient ID:  Name:  Emily [name]  MRN:  [id number]  DOB:  10-Sep-1951/67 y.o.  Date:  06/21/2018      HISTORY OF PRESENT ILLNESS:  Emily [name] is a 67 y.o. Caucasian postmenopausal female who came to me for a second opinion for vulvar dysplasia.  She is a patient of Dr. Crissie Sickles.  Two separate biopsies of the right labia minora revealed high-grade squamous intraepithelial lesion, severe vulvar dysplasia, or VIN 3.  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  He asked that I see her for consultation.     I discussed the pathology from the biopsy with her and explained the need for surgical excision of the lesion.  Since it was a precancerous lesion, the resection margins did not need to be radical.  A wide local excision or simple partial vulvectomy should be all that's required.  I recommended excising the skin tag along with the lesion, as they were immediately adjacent to one another.  She was taken to the OR on 05/18/18 for simple partial vulvectomy.    Operative findings: Small, flat, plaque on the right upper vulva, just lateral to the fold of the labia minora.  Small, raised, "skin tag" just above and lateral.  Both were excised completely and I was able to salvage most of the labia minora.     FINAL PATHOLOGIC DIAGNOSIS   Right upper vulva, partial vulvectomy:   Vulvar intraepithelial neoplasia, grade 3 of 3 (VIN III) focally extending to 3:00 and 9:00 peripheral skin margins   Remainder of skin margins and deep margin, no evidence of dysplasia   Incidental melanocytic nevus, intradermal type      She presents today for her postoperative check.  She is without complaints.      ROS:  GU and GI review:  Negative  Cardiopulmonary review:  Negative   Musculoskeletal:  Negative    A comprehensive review of systems was negative except for that written in the History of  Present Illness., 10 point ROS      OB/GYN ROS:  There is no history of significant gyn problems or procedures.  Patient denies any abnormal bleeding or vaginal discharge.       Problem List:  Patient Active Problem List    Diagnosis Date Noted   ??? VIN III (vulvar intraepithelial neoplasia III) 03/22/2018   ??? Lymphedema of left arm 08/21/2015   ??? Pheochromocytoma 08/21/2015   ??? Arthritis of both knees 08/21/2015   ??? 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 Malignant neoplasm of lower-inner quadrant of left female breast ([facility]) 07/24/2015   ??? Breast cancer, stage 2 ([facility]) 10/22/2012     PMH:  Past Medical History:   Diagnosis Date   ??? Anxiety    ??? Arthritis    ??? Basal cell carcinoma    ??? Cancer ([facility])     left breast  cancer   ??? Chronic tension headaches    ??? Concussion 2011   ??? Headache(784.0)     OCCAS   ??? Hx of radiation therapy     COMPLETED IN 2014   ??? Ill-defined condition     cellulitis L UE   ??? Lymphedema of arm     left   ??? Nausea & vomiting    ???  Other and unspecified symptoms and signs involving general sensations and perceptions     HIVES RECENTLY- WAS IN ER Feb 19, 2013   ??? Other ill-defined conditions(799.89)     PHEOCHROMOCYTOMA   ??? S/P chemotherapy, time since greater than 12 weeks     COMPLETED CHEMOTHERAPY IN JUNE 2014   ??? SCCA (squamous cell carcinoma) of skin       PSH:  Past Surgical History:   Procedure Laterality Date   ??? ABDOMEN SURGERY PROC UNLISTED      hernia mesh repair   ??? ABDOMEN SURGERY PROC UNLISTED      right adenal removed   ??? BREAST SURGERY PROCEDURE UNLISTED  '91    breast implants; replaced bilat implants 2011   ??? HX APPENDECTOMY     ??? HX BREAST AUGMENTATION Left 04/22/2014    REMOVAL OF LEFT BREAST IMPLANT, WASHOUT AND CLOSURE OF LEFT BREAST WOUND performed by Sharlotte Alamo, MD at [facility]   ??? HX BREAST RECONSTRUCTION  11/28/2012    BREAST RECONSTRUCTION /C  INSERTION EXPANDER & ALLODERM performed by Sharlotte Alamo, MD at [facility]   ??? HX BREAST  RECONSTRUCTION Bilateral 09/16/2013    REMOVAL AND REPLACEMENT LEFT BREAST IMPLANT TISSUE EXPANDERS WITH SUBTOTAL CAPSULECTOMY, RIGHT BREAST IMPLANT REMOVAL AND REPLACEMENT FOR SYMMETRY/FAT GRAFTING TO BILATERAL BREASTS performed by Sharlotte Alamo, MD at [facility]   ??? HX GYN      c-section   ??? HX HEENT      T&A   ??? HX HEENT      right adrenal removed   ??? HX MASTECTOMY Left    ??? HX MODIFIED RADICAL MASTECTOMY  11/28/2012    LEFT BREAST MODIFIED RADICAL MASTECTOMY WITH AXILLARY DISSECTION W/ RECONSTRUCTION LEFT BREAST WITH TISSUE EXPANDER AND ALLODERM;PORTACATH INSERTION performed by Ty Hilts, MD at [facility]   ??? HX OOPHORECTOMY      right   ??? HX ORTHOPAEDIC      bilateral shoulder arthroscopy   ??? HX VASCULAR ACCESS      PORT, THEN REMOVED      Social History:  Social History     Tobacco Use   ??? Smoking status: Never Smoker   ??? Smokeless tobacco: Never Used   Substance Use Topics   ??? Alcohol use: No      Family History:  Family History   Problem Relation Age of Onset   ??? Alzheimer Mother    ??? Diabetes Mother    ??? Other Mother         COMBATIVE POST ANESTHESIA   ??? Heart Disease Father    ??? Diabetes Father    ??? Lung Disease Father         COPD   ??? Headache Sister    ??? Migraines Sister    ??? Anesth Problems Neg Hx       Medications: (reviewed)  Current Outpatient Medications   Medication Sig   ??? OTHER Mastectomy bra with prosthesis    Dx. Breast cancer, C50.312   ??? DULoxetine (CYMBALTA) 20 mg capsule TAKE 1 CAPSULE BY MOUTH EVERY DAY   ??? diazePAM (VALIUM) 10 mg tablet TAKE 1/2 TO 1 TABLET BY MOUTH TWICE A DAY AS NEEDED FOR ANXIETY   ??? zolpidem (AMBIEN) 10 mg tablet TAKE 1 TABLET BY MOUTH AT BEDTIME   ??? codeine-butalbital-acetaminophen-caffeine (FIORICET WITH CODEINE) 50-325-40-30 mg per capsule    ??? valACYclovir (VALTREX) 1 gram tablet Take 2  tabs by mouth twice a day   Indications: PREVENTION OF HERPES ZOSTER IN IMMUNOCOMPROMISED PATIENT   ??? oxyCODONE IR (ROXICODONE) 5 mg immediate release tablet  Take 5 mg by mouth every four (4) hours as needed for Pain.   ??? BOTOX 200 unit injection      No current facility-administered medications for this visit.      Allergies: (reviewed)  Allergies   Allergen Reactions   ??? Aspirin Unknown (comments)     Gi bleeding   ??? Bacitracin Hives and Swelling   ??? Compazine [Prochlorperazine] Rash   ??? Keflex [Cephalexin] Rash   ??? Nsaids (Non-Steroidal Anti-Inflammatory Drug) Swelling     Throat swells          OBJECTIVE:    Physical Exam:  VITAL SIGNS: Vitals:    06/21/18 0932   BP: 142/80   Pulse: 64   Weight: 117 lb 6.4 oz (53.3 kg)   Height: 5' 5.51" (1.664 m)     Body mass index is 19.23 kg/m??.   GENERAL APP: Conversant, alert, oriented. No acute distress.   HEENT: HEENT. No thyroid enlargement. No JVD.   Neck: Supple without restrictions.   RESPIRATORY: Clear to auscultation and percussion to the bases. No CVAT.   CARDIOVASC: RRR without murmur/rub.   GASTROINT: soft, non-tender, without masses or organomegaly   MUSCULOSKEL: no joint tenderness, deformity or swelling   EXTREMITIES: extremities normal, atraumatic, no cyanosis or edema   PELVIC: Vulvectomy incision site well healed.  Excellent cosmetic result.  No signs of residual disease.   RECTAL: Deferred   NODAL SURVEY: No suspicious lymphadenopathy or edema noted.   NEURO: Grossly intact. No acute deficit.       Lab Data:    Lab Results   Component Value Date/Time    WBC 3.5 10/30/2015 09:13 AM    HGB 13.2 10/30/2015 09:13 AM    HCT 40.3 10/30/2015 09:13 AM    PLATELET 214 10/30/2015 09:13 AM    MCV 90 10/30/2015 09:13 AM     Lab Results   Component Value Date/Time    Sodium 140 10/30/2015 09:13 AM    Potassium 3.9 10/30/2015 09:13 AM    Chloride 100 10/30/2015 09:13 AM    CO2 25 10/30/2015 09:13 AM    Anion gap 10 02/05/2013 09:44 AM    Glucose 79 10/30/2015 09:13 AM    BUN 7 (L) 10/30/2015 09:13 AM    Creatinine 0.49 (L) 10/30/2015 09:13 AM    BUN/Creatinine ratio 14 10/30/2015 09:13 AM    GFR est AA 121 10/30/2015 09:13  AM    GFR est non-AA 105 10/30/2015 09:13 AM    Calcium 8.2 (L) 10/30/2015 09:13 AM         Imaging:  None available      IMPRESSION/PLAN:  TORSHA LEMUS is a 67 y.o. female with a diagnosis of VIN 3.  She is one month post-op from simple partial vulvectomy.  There was no evidence of an invasive lesion on pathology.  She did have microscopically positive margins at 3 o'clock and 9 o'clock, though clinically the margins were widely negative.  I do not recommend any addition excision at this time, but I explained to her that she is at risk for recurrence.  I stressed to her the importance of keeping an eye on the area.  She is to call if she sees any changes in the area.  I will see her back in 6 months for follow-up evaluation.  Signed By: Albertha Ghee, MD     06/21/2018/10:07 AM

## 2018-06-21 NOTE — Progress Notes (Signed)
Post operative follow up.

## 2018-06-21 NOTE — Progress Notes (Signed)
[facility], [place] [address]  P [phone]   F [phone]        Patient ID:  Name:  Emily [name]  MRN:  [id number]  DOB:  Aug 14, 1951/66 y.o.  Date:  06/21/2018      HISTORY OF PRESENT ILLNESS:  Emily [name] is a 67 y.o. Caucasian postmenopausal female who came to me for a second opinion for vulvar dysplasia.  She is a patient of Dr. Crissie Sickles.  Two separate biopsies of the right labia minora revealed high-grade squamous intraepithelial lesion, severe vulvar dysplasia, or VIN 3.  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  He asked that I see her for consultation.     I discussed the pathology from the biopsy with her and explained the need for surgical excision of the lesion.  Since it was a precancerous lesion, the resection margins did not need to be radical.  A wide local excision or simple partial vulvectomy should be all that's required.  I recommended excising the skin tag along with the lesion, as they were immediately adjacent to one another.  She was taken to the OR on 05/18/18 for simple partial vulvectomy.    Operative findings: Small, flat, plaque on the right upper vulva, just lateral to the fold of the labia minora.  Small, raised, "skin tag" just above and lateral.  Both were excised completely and I was able to salvage most of the labia minora.     FINAL PATHOLOGIC DIAGNOSIS   Right upper vulva, partial vulvectomy:   Vulvar intraepithelial neoplasia, grade 3 of 3 (VIN III) focally extending to 3:00 and 9:00 peripheral skin margins   Remainder of skin margins and deep margin, no evidence of dysplasia   Incidental melanocytic nevus, intradermal type      She presents today for her postoperative check.  She is without complaints.      ROS:  GU and GI review:  Negative  Cardiopulmonary review:  Negative   Musculoskeletal:  Negative    A comprehensive review of systems was negative except for that written in  the History of Present Illness., 10 point ROS      OB/GYN ROS:  There is no history of significant gyn problems or procedures.  Patient denies any abnormal bleeding or vaginal discharge.       Problem List:  Patient Active Problem List    Diagnosis Date Noted   ??? VIN III (vulvar intraepithelial neoplasia III) 03/22/2018   ??? Lymphedema of left arm 08/21/2015   ??? Pheochromocytoma 08/21/2015   ??? Arthritis of both knees 08/21/2015   ??? 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 Malignant neoplasm of lower-inner quadrant of left female breast ([facility]) 07/24/2015   ??? Breast cancer, stage 2 ([facility]) 10/22/2012     PMH:  Past Medical History:   Diagnosis Date   ??? Anxiety    ??? Arthritis    ??? Basal cell carcinoma    ??? Cancer ([facility])     left breast  cancer   ??? Chronic tension headaches    ??? Concussion 2011   ??? Headache(784.0)     OCCAS   ??? Hx of radiation therapy     COMPLETED IN 2014   ??? Ill-defined condition     cellulitis L UE   ??? Lymphedema of arm     left   ??? Nausea & vomiting    ???  Other and unspecified symptoms and signs involving general sensations and perceptions     HIVES RECENTLY- WAS IN ER Feb 19, 2013   ??? Other ill-defined conditions(799.89)     PHEOCHROMOCYTOMA   ??? S/P chemotherapy, time since greater than 12 weeks     COMPLETED CHEMOTHERAPY IN JUNE 2014   ??? SCCA (squamous cell carcinoma) of skin       PSH:  Past Surgical History:   Procedure Laterality Date   ??? ABDOMEN SURGERY PROC UNLISTED      hernia mesh repair   ??? ABDOMEN SURGERY PROC UNLISTED      right adenal removed   ??? BREAST SURGERY PROCEDURE UNLISTED  '91    breast implants; replaced bilat implants 2011   ??? HX APPENDECTOMY     ??? HX BREAST AUGMENTATION Left 04/22/2014    REMOVAL OF LEFT BREAST IMPLANT, WASHOUT AND CLOSURE OF LEFT BREAST WOUND performed by Sharlotte Alamo, MD at [facility]   ??? HX BREAST RECONSTRUCTION  11/28/2012    BREAST RECONSTRUCTION /C  INSERTION EXPANDER & ALLODERM performed by Sharlotte Alamo, MD at [facility]    ??? HX BREAST RECONSTRUCTION Bilateral 09/16/2013    REMOVAL AND REPLACEMENT LEFT BREAST IMPLANT TISSUE EXPANDERS WITH SUBTOTAL CAPSULECTOMY, RIGHT BREAST IMPLANT REMOVAL AND REPLACEMENT FOR SYMMETRY/FAT GRAFTING TO BILATERAL BREASTS performed by Sharlotte Alamo, MD at [facility]   ??? HX GYN      c-section   ??? HX HEENT      T&A   ??? HX HEENT      right adrenal removed   ??? HX MASTECTOMY Left    ??? HX MODIFIED RADICAL MASTECTOMY  11/28/2012    LEFT BREAST MODIFIED RADICAL MASTECTOMY WITH AXILLARY DISSECTION W/ RECONSTRUCTION LEFT BREAST WITH TISSUE EXPANDER AND ALLODERM;PORTACATH INSERTION performed by Ty Hilts, MD at [facility]   ??? HX OOPHORECTOMY      right   ??? HX ORTHOPAEDIC      bilateral shoulder arthroscopy   ??? HX VASCULAR ACCESS      PORT, THEN REMOVED      Social History:  Social History     Tobacco Use   ??? Smoking status: Never Smoker   ??? Smokeless tobacco: Never Used   Substance Use Topics   ??? Alcohol use: No      Family History:  Family History   Problem Relation Age of Onset   ??? Alzheimer Mother    ??? Diabetes Mother    ??? Other Mother         COMBATIVE POST ANESTHESIA   ??? Heart Disease Father    ??? Diabetes Father    ??? Lung Disease Father         COPD   ??? Headache Sister    ??? Migraines Sister    ??? Anesth Problems Neg Hx       Medications: (reviewed)  Current Outpatient Medications   Medication Sig   ??? OTHER Mastectomy bra with prosthesis    Dx. Breast cancer, C50.312   ??? DULoxetine (CYMBALTA) 20 mg capsule TAKE 1 CAPSULE BY MOUTH EVERY DAY   ??? diazePAM (VALIUM) 10 mg tablet TAKE 1/2 TO 1 TABLET BY MOUTH TWICE A DAY AS NEEDED FOR ANXIETY   ??? zolpidem (AMBIEN) 10 mg tablet TAKE 1 TABLET BY MOUTH AT BEDTIME   ??? codeine-butalbital-acetaminophen-caffeine (FIORICET WITH CODEINE) 50-325-40-30 mg per capsule    ??? valACYclovir (VALTREX) 1 gram tablet Take 2  tabs by mouth twice a day   Indications: PREVENTION OF HERPES ZOSTER IN IMMUNOCOMPROMISED PATIENT    ??? oxyCODONE IR (ROXICODONE) 5 mg immediate release tablet Take 5 mg by mouth every four (4) hours as needed for Pain.   ??? BOTOX 200 unit injection      No current facility-administered medications for this visit.      Allergies: (reviewed)  Allergies   Allergen Reactions   ??? Aspirin Unknown (comments)     Gi bleeding   ??? Bacitracin Hives and Swelling   ??? Compazine [Prochlorperazine] Rash   ??? Keflex [Cephalexin] Rash   ??? Nsaids (Non-Steroidal Anti-Inflammatory Drug) Swelling     Throat swells          OBJECTIVE:    Physical Exam:  VITAL SIGNS: Vitals:    06/21/18 0932   BP: 142/80   Pulse: 64   Weight: 117 lb 6.4 oz (53.3 kg)   Height: 5' 5.51" (1.664 m)     Body mass index is 19.23 kg/m??.   GENERAL APP: Conversant, alert, oriented. No acute distress.   HEENT: HEENT. No thyroid enlargement. No JVD.   Neck: Supple without restrictions.   RESPIRATORY: Clear to auscultation and percussion to the bases. No CVAT.   CARDIOVASC: RRR without murmur/rub.   GASTROINT: soft, non-tender, without masses or organomegaly   MUSCULOSKEL: no joint tenderness, deformity or swelling   EXTREMITIES: extremities normal, atraumatic, no cyanosis or edema   PELVIC: Vulvectomy incision site well healed.  Excellent cosmetic result.  No signs of residual disease.   RECTAL: Deferred   NODAL SURVEY: No suspicious lymphadenopathy or edema noted.   NEURO: Grossly intact. No acute deficit.       Lab Data:    Lab Results   Component Value Date/Time    WBC 3.5 10/30/2015 09:13 AM    HGB 13.2 10/30/2015 09:13 AM    HCT 40.3 10/30/2015 09:13 AM    PLATELET 214 10/30/2015 09:13 AM    MCV 90 10/30/2015 09:13 AM     Lab Results   Component Value Date/Time    Sodium 140 10/30/2015 09:13 AM    Potassium 3.9 10/30/2015 09:13 AM    Chloride 100 10/30/2015 09:13 AM    CO2 25 10/30/2015 09:13 AM    Anion gap 10 02/05/2013 09:44 AM    Glucose 79 10/30/2015 09:13 AM    BUN 7 (L) 10/30/2015 09:13 AM    Creatinine 0.49 (L) 10/30/2015 09:13 AM     BUN/Creatinine ratio 14 10/30/2015 09:13 AM    GFR est AA 121 10/30/2015 09:13 AM    GFR est non-AA 105 10/30/2015 09:13 AM    Calcium 8.2 (L) 10/30/2015 09:13 AM         Imaging:  None available      IMPRESSION/PLAN:  Emily [name] is a 67 y.o. female with a diagnosis of VIN 3.  She is one month post-op from simple partial vulvectomy.  There was no evidence of an invasive lesion on pathology.  She did have microscopically positive margins at 3 o'clock and 9 o'clock, though clinically the margins were widely negative.  I do not recommend any addition excision at this time, but I explained to her that she is at risk for recurrence.  I stressed to her the importance of keeping an eye on the area.  She is to call if she sees any changes in the area.  I will see her back in 6 months for follow-up evaluation.  Signed By: Albertha Ghee, MD     06/21/2018/10:07 AM

## 2018-07-06 ENCOUNTER — Inpatient Hospital Stay: Admit: 2018-07-06 | Primary: Family Medicine

## 2018-08-15 ENCOUNTER — Ambulatory Visit: Attending: Specialist

## 2018-08-15 ENCOUNTER — Ambulatory Visit
Admit: 2018-08-15 | Discharge: 2018-08-15 | Payer: PRIVATE HEALTH INSURANCE | Attending: Specialist | Primary: Family Medicine

## 2018-08-15 DIAGNOSIS — Z170 Estrogen receptor positive status [ER+]: Secondary | ICD-10-CM

## 2018-08-15 DIAGNOSIS — C50312 Malignant neoplasm of lower-inner quadrant of left female breast: Secondary | ICD-10-CM

## 2018-08-15 NOTE — Progress Notes (Signed)
Emily [name] is a 67 y.o. female here for follow-up of breast cancer.

## 2018-08-15 NOTE — Progress Notes (Signed)
[facility]  [address] [place]   [address]  W: [phone]   F: [phone]      f/u HEME/ONC CONSULT    Reason for visit: evaluation for treatment for  Breast cancer    Consulting physician:  Dr. Gilford Rile, Dr. Raynald Blend, Brooke Bonito.  Ref physician:  Dr. Drucilla Schmidt    HPI:   AADVIKA KONEN is a 67 y.o.  female who I am seeing for management of breast cancer        She had a screening ultrasound on 09/24/12 showing a 2.6 cm left LN.  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  HER 2 negative IHC at 0.    She has a history of a pheochromocytoma surgically removed in 1997.    She then had a MRI breast on 10/05/12 showing the 2.8 x 2 cm LN as well as a left breast LIQ 0.9 cm x 1.1 cm x 0.6 cm mass.  Biopsy of that mass by FNA on 10/16/12 shows a poorly differentiated carcinoma.  These slides were air-dried, HER 2 not able to be performed on them.    Mammaprint of LN shows high risk luminal, ER + at 0.19, PR + at 0.61, HER 2 negative at -0.69.    She underwent L mastectomy on 11/28/12 showing IDC with neuroendocrine features, gr 2, 0.5 cm, DCIS gr 2-3, + LVI, 1/14 LN + with a 1 cm met, no ECE.  HER 2 negative (ratio 1.2, sig/cell 2.8), ER + 95%, PR + 5%; ki67 5%; DCIS ER + 95%, PR + 1%.    S/p TC q 3 weeks x 4 from 12/25/12-02/26/13 (75 mg/m2; 600 mg/m2)    S/p XRT from 04/02/13-05/10/13    Started anastrozole 05/17/13, stop 07/30/13 (joint pains)    exemestane 08/06/13-09/30/13 (joint pains)    Started zometa 07/10/13, q 6 months x 3 years, stopped     Started tamoxifen 09/30/13, stopped on 03/08/14 due to joint pains    Started letrozole 03/23/14, stopped 09/18/14 due to joint pains    Started tamoxifen 10/04/14, stopped on  12/15/14 due to hot flashes and mood swings.    Anastrozole 12/16/14-01/30/15, stopped due to headache and dizziness    Toremefine 02/09/15-05/2018    Interval History: In today for follow up.  Complains of gr 2 insomnia, 2/10 pain, gr 2 headache, gr 1 libido, gr 1 vaginal dryness.    She plans on moving to [place], recently bought house.      LMP 1996.    DX   Encounter Diagnoses   Name Primary?   ??? Malignant neoplasm of lower-inner quadrant of left breast in female, estrogen receptor positive (HCC) Yes   ??? Neck pain    ??? At high risk for breast cancer    ??? Rheumatoid arthritis, involving unspecified site, unspecified rheumatoid factor presence ([place])    ??? Age-related osteoporosis without current pathological fracture    ??? Chronic arthralgias of knees and hips    ??? Tension-type headache, not intractable, unspecified chronicity pattern       Past Medical History:   Diagnosis Date   ??? Anxiety    ??? Arthritis    ??? Basal cell carcinoma    ??? Cancer ([place])  left breast  cancer   ??? Chronic tension headaches    ??? Concussion 2011   ??? Headache(784.0)     OCCAS   ??? Hx of radiation therapy     COMPLETED IN 2014   ??? Ill-defined condition     cellulitis L UE   ??? Lymphedema of arm     left   ??? Nausea & vomiting    ??? Other and unspecified symptoms and signs involving general sensations and perceptions     HIVES RECENTLY- WAS IN ER Feb 19, 2013   ??? Other ill-defined conditions(799.89)     PHEOCHROMOCYTOMA   ??? S/P chemotherapy, time since greater than 12 weeks     COMPLETED CHEMOTHERAPY IN JUNE 2014   ??? SCCA (squamous cell carcinoma) of skin      Past Surgical History:   Procedure Laterality Date   ??? ABDOMEN SURGERY PROC UNLISTED      hernia mesh repair   ??? ABDOMEN SURGERY PROC UNLISTED      right adenal removed   ??? BREAST SURGERY PROCEDURE UNLISTED  '91    breast implants; replaced bilat implants 2011   ??? HX APPENDECTOMY     ??? HX BREAST AUGMENTATION Left 04/22/2014    REMOVAL OF LEFT BREAST IMPLANT, WASHOUT AND CLOSURE OF LEFT BREAST  WOUND performed by Sharlotte Alamo, MD at [facility]   ??? HX BREAST RECONSTRUCTION  11/28/2012    BREAST RECONSTRUCTION /C  INSERTION EXPANDER & ALLODERM performed by Sharlotte Alamo, MD at [facility]   ??? HX BREAST RECONSTRUCTION Bilateral 09/16/2013    REMOVAL AND REPLACEMENT LEFT BREAST IMPLANT TISSUE EXPANDERS WITH SUBTOTAL CAPSULECTOMY, RIGHT BREAST IMPLANT REMOVAL AND REPLACEMENT FOR SYMMETRY/FAT GRAFTING TO BILATERAL BREASTS performed by Sharlotte Alamo, MD at [facility]   ??? HX GYN      c-section   ??? HX HEENT      T&A   ??? HX HEENT      right adrenal removed   ??? HX MASTECTOMY Left    ??? HX MODIFIED RADICAL MASTECTOMY  11/28/2012    LEFT BREAST MODIFIED RADICAL MASTECTOMY WITH AXILLARY DISSECTION W/ RECONSTRUCTION LEFT BREAST WITH TISSUE EXPANDER AND ALLODERM;PORTACATH INSERTION performed by Ty Hilts, MD at [facility]   ??? HX OOPHORECTOMY      right   ??? HX ORTHOPAEDIC      bilateral shoulder arthroscopy   ??? HX VASCULAR ACCESS      PORT, THEN REMOVED     Social History     Socioeconomic History   ??? Marital status: SINGLE     Spouse name: Not on file   ??? Number of children: Not on file   ??? Years of education: Not on file   ??? Highest education level: Not on file   Tobacco Use   ??? Smoking status: Never Smoker   ??? Smokeless tobacco: Never Used   Substance and Sexual Activity   ??? Alcohol use: No   ??? Drug use: No     Family History   Problem Relation Age of Onset   ??? Alzheimer Mother    ??? Diabetes Mother    ??? Other Mother         COMBATIVE POST ANESTHESIA   ??? Heart Disease Father    ??? Diabetes Father    ??? Lung Disease Father         COPD   ??? Headache Sister    ??? Migraines  Sister    ??? Anesth Problems Neg Hx        Current Outpatient Medications   Medication Sig Dispense Refill   ??? OTHER Mastectomy bra with prosthesis    Dx. Breast cancer, C50.312 6 Each 1   ??? DULoxetine (CYMBALTA) 20 mg capsule TAKE 1 CAPSULE BY MOUTH EVERY DAY  3   ??? BOTOX 200 unit injection      ??? diazePAM (VALIUM) 10 mg tablet  TAKE 1/2 TO 1 TABLET BY MOUTH TWICE A DAY AS NEEDED FOR ANXIETY     ??? zolpidem (AMBIEN) 10 mg tablet TAKE 1 TABLET BY MOUTH AT BEDTIME  3   ??? codeine-butalbital-acetaminophen-caffeine (FIORICET WITH CODEINE) 50-325-40-30 mg per capsule      ??? valACYclovir (VALTREX) 1 gram tablet Take 2 tabs by mouth twice a day   Indications: PREVENTION OF HERPES ZOSTER IN IMMUNOCOMPROMISED PATIENT 120 Tab 5   ??? oxyCODONE IR (ROXICODONE) 5 mg immediate release tablet Take 5 mg by mouth every four (4) hours as needed for Pain.         Allergies   Allergen Reactions   ??? Aspirin Unknown (comments)     Gi bleeding   ??? Bacitracin Hives and Swelling   ??? Compazine [Prochlorperazine] Rash   ??? Keflex [Cephalexin] Rash   ??? Nsaids (Non-Steroidal Anti-Inflammatory Drug) Swelling     Throat swells       Review of Systems    A comprehensive review of systems was performed and all systems were negative except for HPI and for the symptom report form, reviewed and scanned in.      Objective:  Physical Exam:  Visit Vitals  BP 112/63   Pulse 71   Temp 97.1 ??F (36.2 ??C) (Temporal)   Resp 18   Ht 5' 5.51" (1.664 m)   Wt 117 lb 9.6 oz (53.3 kg)   SpO2 97%   BMI 19.27 kg/m??       General:  Alert, cooperative, no distress, appears stated age.   Head:  Normocephalic, without obvious abnormality, atraumatic.   Eyes:  Conjunctivae/corneas clear. PERRL, EOMs intact.   Throat: Lips, mucosa, and tongue normal.    Neck: Supple, symmetrical, trachea midline, no adenopathy, thyroid: no enlargement/tenderness/nodules   Back:   Symmetric, no curvature. ROM normal. No CVA tenderness.   Lungs:   Clear to auscultation bilaterally.   Chest wall:  S/p bilateral mastectomy with reconstruction.    Heart:  Regular rate and rhythm, S1, S2 normal, no murmur, click, rub or gallop.   Abdomen:   Soft, non-tender. Bowel sounds normal. No masses,  No organomegaly.   Extremities:  Extremities normal, atraumatic, no cyanosis.   Skin: No rash   Lymph nodes: Cervical, supraclavicular,  and axillary nodes normal.   Neurologic: CNII-XII intact.           Diagnostic Imaging   10/12/12 PET:  Negative for distant mets  10/22/12 brain MRI:  Negative    09/24/12 dexa L hip T -2.5; L spine T -2.3  osteoporosis    12/11/13 bilateral MRI  Negative    03/26/14 MRI Brain  Negative    12/05/14 MRI breast   Negative    12/05/14 [place] L breast?? ????  IMPRESSION:  1. Very small, hypoechoic lesion in continuity and immediately below the ??  dermis overlying the sternum/medialmost left breast. Finding suggests dermal ??  lesion such as epidermal inclusion cyst versus postoperative change.  BI-RADS Category 2 - Benign findings. ??  01/01/16 breast MRI  Negative    08/22/16 Breast MRI  Negative    04/17/17 [place] right ext  IMPRESSION: Probable lipoma in the right axillary region. Given that this lesion is  palpable, clinical follow-up is recommended.     08/31/17 MRI breast  IMPRESSION:   1. No MRI evidence of malignancy.  2. Bilateral silicone implants are intact.  ??  Assessment: ACR BI-RADS category   Left breast: BI-RADS Assessment Category 1: Negative.  Right breast: BI-RADS Assessment Category 1: Negative.    Lab Results  Lab Results   Component Value Date/Time    WBC 3.5 10/30/2015 09:13 AM    HGB 13.2 10/30/2015 09:13 AM    HCT 40.3 10/30/2015 09:13 AM    PLATELET 214 10/30/2015 09:13 AM    MCV 90 10/30/2015 09:13 AM       Lab Results   Component Value Date/Time    Sodium 140 10/30/2015 09:13 AM    Potassium 3.9 10/30/2015 09:13 AM    Chloride 100 10/30/2015 09:13 AM    CO2 25 10/30/2015 09:13 AM    Anion gap 10 02/05/2013 09:44 AM    Glucose 79 10/30/2015 09:13 AM    BUN 7 (L) 10/30/2015 09:13 AM    Creatinine 0.49 (L) 10/30/2015 09:13 AM    BUN/Creatinine ratio 14 10/30/2015 09:13 AM    GFR est AA 121 10/30/2015 09:13 AM    GFR est non-AA 105 10/30/2015 09:13 AM    Calcium 8.2 (L) 10/30/2015 09:13 AM    AST (SGOT) 25 10/30/2015 09:13 AM    Alk. phosphatase 49 10/30/2015 09:13 AM    Protein, total 6.9 10/30/2015 09:13 AM     Albumin 4.4 10/30/2015 09:13 AM    Globulin 2.4 02/05/2013 09:44 AM    A-G Ratio 1.8 10/30/2015 09:13 AM    ALT (SGPT) 18 10/30/2015 09:13 AM     Assessment/Plan:  67 y.o. female with a 0.5 cm left breast cancer, IDC with endocrine features, 1/14 LN +, gr 2, high risk mammaprint,  ER and PR +, HER 2 negative. PS 0    1. Breast cancer with neuroendocrine features, Left, lower inner quadrant, stage IIA, [id number]    With no further therapy, her RR is about 36%; endocrine therapy will decrease her RR to 19%; and 2nd gen chemo will decrease her RR to 12%; 3rd gen to 10%.  These #s may be higher with her high risk mammaprint.     Based on her pheo and now breast cancer history, I recommended genetic counseling -- referral to [facility] made at prior visit. Has seen them and they have performed BRCA testing -- negative.  [facility] has run a PGL panel on 09/10/14.    No evidence of recurrence, completed toremifene in 05/2018.       Last eye exam: 07/2017, scheduled for 08/16/18.   Last gyn exam: 12/2017 Dr. Melina Modena    Breast MRI 08/31/17 negative, next scheduled for 08/31/2018.    2. Anxiety/Depression: Stable today; continue sertraline 100 mg daily. Valium prn.  Dr. Kalman Shan is managing her antidepressant    3. Osteoporosis: Had received zometa previously, which was given for one dose on 07/10/13. Switched to prolia 60 mg SC q 6 months, received 04/08/14,10/01/14, 03/10/15, 09/14/15. She had one remaining Prolia injection, but she had oral surgery in July 2017, discontinued    DEXA 10/01/14 at the [facility], patient reports improvement. Repeat DEXA in 2017 showed stable osteoporosis.  dexa in 2018 showed improvement.  Repeat DEXA on 02/18/2018 showed worsening osteoporosis to left femoral neck and lumbar spine. This may have been due to stopping the prolia, as now I would not recommend stopping it unless another oral issue arises.    Currently taking Vit D3 2000 International Units daily.    Plan was to restart prolia 60 mg SC q 6  months in 03/2018, however she was unable to do this due to insurance.  She would like to do this here at the infusion center starting in 10/2018.     4. Fibromyalgia/RA: Not currently on any treatment, previously discussed with patient that there are medical options to assist with her pain. She continues to follow up with her Rheumatologist every 6 weeks. She took MTX prior to cancer diagnosis which she did not tolerate.  Previously did not tolerate lyrica due to sedation.  She reports she has been receiving cortisone injections. She has also started Cymbalta which has helped.     5. Headaches:  Has chronic headaches, uses botox, saw Dr. Rolm Bookbinder.  MRI ordered previously but was unable to get this done, will reorder.     6. Left breast mass UOQ pain:  MRI in November 2018 negative; reviewed Dr. Arlyss Queen note; monitoring; ordered 2019 MRI    7. Neck pain: Severe and chronic.  Being monitored by Dr. Maretta Bees and Dr. Gari Crown who referred to PT.  Getting botox.   Xray of neck done at [facility]    > 25 minutes were spent with this patient with > 50% of that time spent in face to face counseling.      Follow-up and Dispositions    ?? Return in about 1 year (around 08/16/2019) for 1 yr fu, Laurena Bering.         Sonda Rumble, MD

## 2018-08-15 NOTE — Progress Notes (Signed)
[facility]  [place], [place]   [address]  W: [phone]   F: [phone]      f/u HEME/ONC CONSULT    Reason for visit: evaluation for treatment for  Breast cancer    Consulting physician:  Dr. Gilford Rile, Dr. Raynald Blend, Brooke Bonito.  Ref physician:  Dr. Drucilla Schmidt    HPI:   Emily [name] is a 67 y.o.  female who I am seeing for management of breast cancer        She had a screening ultrasound on 09/24/12 showing a 2.6 cm left LN.  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  HER 2 negative IHC at 0.    She has a history of a pheochromocytoma surgically removed in 1997.    She then had a MRI breast on 10/05/12 showing the 2.8 x 2 cm LN as well as a left breast LIQ 0.9 cm x 1.1 cm x 0.6 cm mass.  Biopsy of that mass by FNA on 10/16/12 shows a poorly differentiated carcinoma.  These slides were air-dried, HER 2 not able to be performed on them.    Mammaprint of LN shows high risk luminal, ER + at 0.19, PR + at 0.61, HER 2 negative at -0.69.    She underwent L mastectomy on 11/28/12 showing IDC with neuroendocrine features, gr 2, 0.5 cm, DCIS gr 2-3, + LVI, 1/14 LN + with a 1 cm met, no ECE.  HER 2 negative (ratio 1.2, sig/cell 2.8), ER + 95%, PR + 5%; ki67 5%; DCIS ER + 95%, PR + 1%.    S/p TC q 3 weeks x 4 from 12/25/12-02/26/13 (75 mg/m2; 600 mg/m2)    S/p XRT from 04/02/13-05/10/13    Started anastrozole 05/17/13, stop 07/30/13 (joint pains)    exemestane 08/06/13-09/30/13 (joint pains)    Started zometa 07/10/13, q 6 months x 3 years, stopped     Started tamoxifen 09/30/13, stopped on 03/08/14 due to joint pains    Started letrozole 03/23/14, stopped 09/18/14 due to joint pains     Started tamoxifen 10/04/14, stopped on 12/15/14 due to hot flashes and mood swings.    Anastrozole 12/16/14-01/30/15, stopped due to headache and dizziness    Toremefine 02/09/15-05/2018    Interval History: In today for follow up.  Complains of gr 2 insomnia, 2/10 pain, gr 2 headache, gr 1 libido, gr 1 vaginal dryness.    She plans on moving to [place], recently bought house.      LMP 1996.    DX   Encounter Diagnoses   Name Primary?   ??? Malignant neoplasm of lower-inner quadrant of left breast in female, estrogen receptor positive (HCC) Yes   ??? Neck pain    ??? At high risk for breast cancer    ??? Rheumatoid arthritis, involving unspecified site, unspecified rheumatoid factor presence ([place])    ??? Age-related osteoporosis without current pathological fracture    ??? Chronic arthralgias of knees and hips    ??? Tension-type headache, not intractable, unspecified chronicity pattern       Past Medical History:   Diagnosis Date   ??? Anxiety    ??? Arthritis    ??? Basal cell carcinoma    ??? Cancer ([place])  left breast  cancer   ??? Chronic tension headaches    ??? Concussion 2011   ??? Headache(784.0)     OCCAS   ??? Hx of radiation therapy     COMPLETED IN 2014   ??? Ill-defined condition     cellulitis L UE   ??? Lymphedema of arm     left   ??? Nausea & vomiting    ??? Other and unspecified symptoms and signs involving general sensations and perceptions     HIVES RECENTLY- WAS IN ER Feb 19, 2013   ??? Other ill-defined conditions(799.89)     PHEOCHROMOCYTOMA   ??? S/P chemotherapy, time since greater than 12 weeks     COMPLETED CHEMOTHERAPY IN JUNE 2014   ??? SCCA (squamous cell carcinoma) of skin      Past Surgical History:   Procedure Laterality Date   ??? ABDOMEN SURGERY PROC UNLISTED      hernia mesh repair   ??? ABDOMEN SURGERY PROC UNLISTED      right adenal removed   ??? BREAST SURGERY PROCEDURE UNLISTED  '91    breast implants; replaced bilat implants 2011   ??? HX APPENDECTOMY     ??? HX BREAST AUGMENTATION Left 04/22/2014     REMOVAL OF LEFT BREAST IMPLANT, WASHOUT AND CLOSURE OF LEFT BREAST WOUND performed by Sharlotte Alamo, MD at [facility]   ??? HX BREAST RECONSTRUCTION  11/28/2012    BREAST RECONSTRUCTION /C  INSERTION EXPANDER & ALLODERM performed by Sharlotte Alamo, MD at [facility]   ??? HX BREAST RECONSTRUCTION Bilateral 09/16/2013    REMOVAL AND REPLACEMENT LEFT BREAST IMPLANT TISSUE EXPANDERS WITH SUBTOTAL CAPSULECTOMY, RIGHT BREAST IMPLANT REMOVAL AND REPLACEMENT FOR SYMMETRY/FAT GRAFTING TO BILATERAL BREASTS performed by Sharlotte Alamo, MD at [facility]   ??? HX GYN      c-section   ??? HX HEENT      T&A   ??? HX HEENT      right adrenal removed   ??? HX MASTECTOMY Left    ??? HX MODIFIED RADICAL MASTECTOMY  11/28/2012    LEFT BREAST MODIFIED RADICAL MASTECTOMY WITH AXILLARY DISSECTION W/ RECONSTRUCTION LEFT BREAST WITH TISSUE EXPANDER AND ALLODERM;PORTACATH INSERTION performed by Ty Hilts, MD at [facility]   ??? HX OOPHORECTOMY      right   ??? HX ORTHOPAEDIC      bilateral shoulder arthroscopy   ??? HX VASCULAR ACCESS      PORT, THEN REMOVED     Social History     Socioeconomic History   ??? Marital status: SINGLE     Spouse name: Not on file   ??? Number of children: Not on file   ??? Years of education: Not on file   ??? Highest education level: Not on file   Tobacco Use   ??? Smoking status: Never Smoker   ??? Smokeless tobacco: Never Used   Substance and Sexual Activity   ??? Alcohol use: No   ??? Drug use: No     Family History   Problem Relation Age of Onset   ??? Alzheimer Mother    ??? Diabetes Mother    ??? Other Mother         COMBATIVE POST ANESTHESIA   ??? Heart Disease Father    ??? Diabetes Father    ??? Lung Disease Father         COPD   ??? Headache Sister    ??? Migraines  Sister    ??? Anesth Problems Neg Hx        Current Outpatient Medications   Medication Sig Dispense Refill   ??? OTHER Mastectomy bra with prosthesis    Dx. Breast cancer, C50.312 6 Each 1   ??? DULoxetine (CYMBALTA) 20 mg capsule TAKE 1 CAPSULE BY MOUTH EVERY DAY  3    ??? BOTOX 200 unit injection      ??? diazePAM (VALIUM) 10 mg tablet TAKE 1/2 TO 1 TABLET BY MOUTH TWICE A DAY AS NEEDED FOR ANXIETY     ??? zolpidem (AMBIEN) 10 mg tablet TAKE 1 TABLET BY MOUTH AT BEDTIME  3   ??? codeine-butalbital-acetaminophen-caffeine (FIORICET WITH CODEINE) 50-325-40-30 mg per capsule      ??? valACYclovir (VALTREX) 1 gram tablet Take 2 tabs by mouth twice a day   Indications: PREVENTION OF HERPES ZOSTER IN IMMUNOCOMPROMISED PATIENT 120 Tab 5   ??? oxyCODONE IR (ROXICODONE) 5 mg immediate release tablet Take 5 mg by mouth every four (4) hours as needed for Pain.         Allergies   Allergen Reactions   ??? Aspirin Unknown (comments)     Gi bleeding   ??? Bacitracin Hives and Swelling   ??? Compazine [Prochlorperazine] Rash   ??? Keflex [Cephalexin] Rash   ??? Nsaids (Non-Steroidal Anti-Inflammatory Drug) Swelling     Throat swells       Review of Systems    A comprehensive review of systems was performed and all systems were negative except for HPI and for the symptom report form, reviewed and scanned in.      Objective:  Physical Exam:  Visit Vitals  BP 112/63   Pulse 71   Temp 97.1 ??F (36.2 ??C) (Temporal)   Resp 18   Ht 5' 5.51" (1.664 m)   Wt 117 lb 9.6 oz (53.3 kg)   SpO2 97%   BMI 19.27 kg/m??       General:  Alert, cooperative, no distress, appears stated age.   Head:  Normocephalic, without obvious abnormality, atraumatic.   Eyes:  Conjunctivae/corneas clear. PERRL, EOMs intact.   Throat: Lips, mucosa, and tongue normal.    Neck: Supple, symmetrical, trachea midline, no adenopathy, thyroid: no enlargement/tenderness/nodules   Back:   Symmetric, no curvature. ROM normal. No CVA tenderness.   Lungs:   Clear to auscultation bilaterally.   Chest wall:  S/p bilateral mastectomy with reconstruction.    Heart:  Regular rate and rhythm, S1, S2 normal, no murmur, click, rub or gallop.   Abdomen:   Soft, non-tender. Bowel sounds normal. No masses,  No organomegaly.    Extremities:  Extremities normal, atraumatic, no cyanosis.   Skin: No rash   Lymph nodes: Cervical, supraclavicular, and axillary nodes normal.   Neurologic: CNII-XII intact.           Diagnostic Imaging   10/12/12 PET:  Negative for distant mets  10/22/12 brain MRI:  Negative    09/24/12 dexa L hip T -2.5; L spine T -2.3  osteoporosis    12/11/13 bilateral MRI  Negative    03/26/14 MRI Brain  Negative    12/05/14 MRI breast   Negative    12/05/14 [place] L breast?? ????  IMPRESSION:  1. Very small, hypoechoic lesion in continuity and immediately below the ??  dermis overlying the sternum/medialmost left breast. Finding suggests dermal ??  lesion such as epidermal inclusion cyst versus postoperative change.  BI-RADS Category 2 - Benign findings. ??  01/01/16 breast MRI  Negative    08/22/16 Breast MRI  Negative    04/17/17 [place] right ext  IMPRESSION: Probable lipoma in the right axillary region. Given that this lesion is  palpable, clinical follow-up is recommended.     08/31/17 MRI breast  IMPRESSION:   1. No MRI evidence of malignancy.  2. Bilateral silicone implants are intact.  ??  Assessment: ACR BI-RADS category   Left breast: BI-RADS Assessment Category 1: Negative.  Right breast: BI-RADS Assessment Category 1: Negative.    Lab Results  Lab Results   Component Value Date/Time    WBC 3.5 10/30/2015 09:13 AM    HGB 13.2 10/30/2015 09:13 AM    HCT 40.3 10/30/2015 09:13 AM    PLATELET 214 10/30/2015 09:13 AM    MCV 90 10/30/2015 09:13 AM       Lab Results   Component Value Date/Time    Sodium 140 10/30/2015 09:13 AM    Potassium 3.9 10/30/2015 09:13 AM    Chloride 100 10/30/2015 09:13 AM    CO2 25 10/30/2015 09:13 AM    Anion gap 10 02/05/2013 09:44 AM    Glucose 79 10/30/2015 09:13 AM    BUN 7 (L) 10/30/2015 09:13 AM    Creatinine 0.49 (L) 10/30/2015 09:13 AM    BUN/Creatinine ratio 14 10/30/2015 09:13 AM    GFR est AA 121 10/30/2015 09:13 AM    GFR est non-AA 105 10/30/2015 09:13 AM    Calcium 8.2 (L) 10/30/2015 09:13 AM     AST (SGOT) 25 10/30/2015 09:13 AM    Alk. phosphatase 49 10/30/2015 09:13 AM    Protein, total 6.9 10/30/2015 09:13 AM    Albumin 4.4 10/30/2015 09:13 AM    Globulin 2.4 02/05/2013 09:44 AM    A-G Ratio 1.8 10/30/2015 09:13 AM    ALT (SGPT) 18 10/30/2015 09:13 AM     Assessment/Plan:  67 y.o. female with a 0.5 cm left breast cancer, IDC with endocrine features, 1/14 LN +, gr 2, high risk mammaprint,  ER and PR +, HER 2 negative. PS 0    1. Breast cancer with neuroendocrine features, Left, lower inner quadrant, stage IIA, [id number]    With no further therapy, her RR is about 36%; endocrine therapy will decrease her RR to 19%; and 2nd gen chemo will decrease her RR to 12%; 3rd gen to 10%.  These #s may be higher with her high risk mammaprint.     Based on her pheo and now breast cancer history, I recommended genetic counseling -- referral to [facility] made at prior visit. Has seen them and they have performed BRCA testing -- negative.  [facility] has run a PGL panel on 09/10/14.    No evidence of recurrence, completed toremifene in 05/2018.       Last eye exam: 07/2017, scheduled for 08/16/18.   Last gyn exam: 12/2017 Dr. Melina Modena    Breast MRI 08/31/17 negative, next scheduled for 08/31/2018.    2. Anxiety/Depression: Stable today; continue sertraline 100 mg daily. Valium prn.  Dr. Kalman Shan is managing her antidepressant    3. Osteoporosis: Had received zometa previously, which was given for one dose on 07/10/13. Switched to prolia 60 mg SC q 6 months, received 04/08/14,10/01/14, 03/10/15, 09/14/15. She had one remaining Prolia injection, but she had oral surgery in July 2017, discontinued    DEXA 10/01/14 at the [facility], patient reports improvement. Repeat DEXA in 2017 showed stable osteoporosis.  dexa in 2018 showed improvement.  Repeat DEXA on 02/18/2018 showed worsening osteoporosis to left femoral neck and lumbar spine. This may have been due to stopping the  prolia, as now I would not recommend stopping it unless another oral issue arises.    Currently taking Vit D3 2000 International Units daily.    Plan was to restart prolia 60 mg SC q 6 months in 03/2018, however she was unable to do this due to insurance.  She would like to do this here at the infusion center starting in 10/2018.     4. Fibromyalgia/RA: Not currently on any treatment, previously discussed with patient that there are medical options to assist with her pain. She continues to follow up with her Rheumatologist every 6 weeks. She took MTX prior to cancer diagnosis which she did not tolerate.  Previously did not tolerate lyrica due to sedation.  She reports she has been receiving cortisone injections. She has also started Cymbalta which has helped.     5. Headaches:  Has chronic headaches, uses botox, saw Dr. Rolm Bookbinder.  MRI ordered previously but was unable to get this done, will reorder.     6. Left breast mass UOQ pain:  MRI in November 2018 negative; reviewed Dr. Arlyss Queen note; monitoring; ordered 2019 MRI    7. Neck pain: Severe and chronic.  Being monitored by Dr. Maretta Bees and Dr. Gari Crown who referred to PT.  Getting botox.   Xray of neck done at [facility]    > 25 minutes were spent with this patient with > 50% of that time spent in face to face counseling.      Follow-up and Dispositions    ?? Return in about 1 year (around 08/16/2019) for 1 yr fu, Laurena Bering.         Sonda Rumble, MD

## 2018-08-24 ENCOUNTER — Ambulatory Visit: Payer: BLUE CROSS/BLUE SHIELD | Primary: Family Medicine

## 2018-08-31 ENCOUNTER — Inpatient Hospital Stay: Payer: BLUE CROSS/BLUE SHIELD | Attending: Nurse Practitioner | Primary: Family Medicine

## 2018-08-31 ENCOUNTER — Inpatient Hospital Stay: Admit: 2018-08-31 | Payer: BLUE CROSS/BLUE SHIELD | Attending: Nurse Practitioner | Primary: Family Medicine

## 2018-08-31 DIAGNOSIS — Z170 Estrogen receptor positive status [ER+]: Secondary | ICD-10-CM

## 2018-08-31 DIAGNOSIS — I6782 Cerebral ischemia: Secondary | ICD-10-CM

## 2018-08-31 MED ORDER — GADOTERATE MEGLUMINE 0.5 MMOL/ML IV SOLUTION
0.5 mmol/mL (376.9 mg/mL) | Freq: Once | INTRAVENOUS | Status: AC
Start: 2018-08-31 — End: 2018-08-31
  Administered 2018-08-31: 15:00:00 via INTRAVENOUS

## 2018-08-31 NOTE — Progress Notes (Signed)
Can you let her know this is benign.  Thanks!

## 2018-08-31 NOTE — Progress Notes (Signed)
Can you let her know, no evidence of cancer in brain.  Thanks!

## 2018-09-03 NOTE — Telephone Encounter (Signed)
Patient called and stated she wants the Results of her MRI  Thanks    [phone]

## 2018-09-03 NOTE — Telephone Encounter (Signed)
Patient called needing a copy of her MRI result sent to Dr. Lawson Radar @ [phone]      CB # [phone]

## 2018-09-03 NOTE — Telephone Encounter (Signed)
09/03/18 4:51 PM: Received call from patient and advised that per Julianne Rice, NP, the brain MRI did not show evidence of cancer in the brain. Patient requested that the report be faxed to Dr. Nickola Major. RN faxed the report as requested. No further questions or concerns at this time.

## 2018-09-04 ENCOUNTER — Encounter: Payer: BLUE CROSS/BLUE SHIELD | Primary: Family Medicine

## 2018-09-04 NOTE — Telephone Encounter (Addendum)
09/04/18 5:48 PM: Returned call to patient. No answer; left voicemail requesting call back.     09/05/18 10:26 AM: PSR received call from patient and advised that [name] will call her tomorrow or Friday to discuss her MRI results. Results from patient's first MRI faxed to Dr. Nickola Major per patient request.    09/07/18 12:03 PM: [name] spoke to the patient and addressed her questions and concerns.

## 2018-09-04 NOTE — Telephone Encounter (Signed)
Patient is now requesting that the 1st MRI of the Brain be sent to Dr. Nickola Major  [phone].    She would like to have [name] or someone give her call to talk about the two MRI and the differential       CB # [phone]

## 2018-09-05 NOTE — Telephone Encounter (Signed)
Patient called at 8:06 wants to talk with [place] about MRI results  Thanks

## 2018-09-06 ENCOUNTER — Inpatient Hospital Stay: Admit: 2018-09-06 | Payer: MEDICARE | Attending: Nurse Practitioner | Primary: Family Medicine

## 2018-09-06 DIAGNOSIS — C50312 Malignant neoplasm of lower-inner quadrant of left female breast: Secondary | ICD-10-CM

## 2018-09-06 MED ORDER — GADOBUTROL 10 MMOL/10 ML (1 MMOL/ML) IV
10 mmol/ mL (1 mmol/mL) | Freq: Once | INTRAVENOUS | Status: AC
Start: 2018-09-06 — End: 2018-09-06
  Administered 2018-09-06: 15:00:00 via INTRAVENOUS

## 2018-09-07 NOTE — Telephone Encounter (Signed)
Called patient per the note that I received from the [facility].  I let her know that Dr. Drucilla Schmidt had reviewed her MRI results and that all was good.  I recommended that she make an appointment to be evaluated for the two lumps that another physician is feeling in her axilla and wants her to have removed.      I left my name and number for her to call me back next week if she has questions.

## 2018-09-11 NOTE — Telephone Encounter (Addendum)
I called the Patient and made her aware that the MRI of the Breast was Benign.       ----- Message from Aquilla Solian, RN sent at 09/11/2018 10:55 AM EST -----    ----- Message -----  From: Henriette Combs, NP  Sent: 09/10/2018  12:58 PM EST  To: June Leap Ward, RN    Can you let her know this is benign.  Thanks!

## 2018-09-11 NOTE — Telephone Encounter (Addendum)
Patient returned my call. I let her know that on the Recent MRI she had done that the Right breast Axillary was Normal, nothing suspicious. She stated that she was still not going to feel better until she had an US done, as she has had 2 Doctors tell her that they felt a hard Lump that has grew in size and moved kind of down towards on the outside of her Breast. She said that she would call Dr.Pellicane herself and set that up since he has done those in the past.     ----- Message from Aquilla Solian, RN sent at 09/11/2018 10:55 AM EST -----    ----- Message -----  From: Henriette Combs, NP  Sent: 09/10/2018  12:58 PM EST  To: June Leap Ward, RN    Can you let her know this is benign.  Thanks!

## 2018-10-15 ENCOUNTER — Inpatient Hospital Stay: Admit: 2018-10-15 | Payer: MEDICARE | Primary: Family Medicine

## 2018-10-15 DIAGNOSIS — C50312 Malignant neoplasm of lower-inner quadrant of left female breast: Secondary | ICD-10-CM

## 2018-10-15 LAB — POC CHEM8
ANION GAP ISTAT,IAGAP: 14 mmol/L (ref 10–20)
Anion gap (POC): 14 mmol/L (ref 10–20)
BUN (POC): 9 mg/dL (ref 9–20)
BUN ISTAT,IBUN: 9 mg/dL (ref 9–20)
CHLORIDE ISTAT,ICL: 99 mmol/L (ref 98–107)
CO2 (POC): 28 mmol/L (ref 21–32)
Calcium, ionized (POC): 1.23 mmol/L (ref 1.12–1.32)
Calcium, ionized, POC: 1.23 mmol/L (ref 1.12–1.32)
Chloride (POC): 99 mmol/L (ref 98–107)
Creatinine (POC): 0.4 mg/dL — ABNORMAL LOW (ref 0.6–1.3)
GFR African American: >60 mL/min/{1.73_m2} (ref >60)
GFR Non-African American: >60 mL/min/{1.73_m2} (ref >60)
GFRAA, POC: >60 mL/min/{1.73_m2} (ref >60)
GFRNA, POC: >60 mL/min/{1.73_m2} (ref >60)
GLUCOSE ISTAT,IGLU: 73 mg/dL (ref 65–100)
Glucose (POC): 73 mg/dL (ref 65–100)
HEMATOCRIT ISTAT,IHCT: 37 % (ref 35.0–47.0)
Hematocrit (POC): 37 % (ref 35.0–47.0)
POC Creatinine: 0.4 mg/dL — ABNORMAL LOW (ref 0.6–1.3)
POTASSIUM ISTAT,IK: 4.7 mmol/L (ref 3.5–5.1)
Potassium (POC): 4.7 mmol/L (ref 3.5–5.1)
SODIUM ISTAT,INA: 135 mmol/L — ABNORMAL LOW (ref 136–145)
Sodium (POC): 135 mmol/L — ABNORMAL LOW (ref 136–145)
TCO2 ISTAT,ITCO2: 28 mmol/L (ref 21–32)

## 2018-10-15 LAB — PHOSPHORUS
Phosphorus: 3.8 MG/DL (ref 2.6–4.7)
Phosphorus: 3.8 MG/DL (ref 2.6–4.7)

## 2018-10-15 LAB — MAGNESIUM
Magnesium: 2.1 mg/dL (ref 1.6–2.4)
Magnesium: 2.1 mg/dL (ref 1.6–2.4)

## 2018-10-15 MED ORDER — DENOSUMAB 60 MG/ML SUB-Q SYRINGE
60 mg/mL | Freq: Once | SUBCUTANEOUS | Status: AC
Start: 2018-10-15 — End: 2018-10-15
  Administered 2018-10-15: 15:00:00 via SUBCUTANEOUS

## 2018-10-15 MED FILL — PROLIA 60 MG/ML SUBCUTANEOUS SYRINGE: 60 mg/mL | SUBCUTANEOUS | Qty: 1

## 2018-10-15 NOTE — Progress Notes (Signed)
 Outpatient Infusion Center Short Visit Progress Note    0930 Patient admitted to [facility] for Prolia  ambulatory in stable condition. Assessment completed. No new concerns voiced.     Labs drawn by PCT and sent for processing.     Results are within parameters to treat.    Patient states she has not had any invasive dental procedures within the last 6-8 weeks nor any scheduled in the next 6-8 weeks.    Vital Signs:  Visit Vitals  BP 100/61   Pulse 71   Temp 98.4 F (36.9 C)   Resp 18   Wt 53.1 kg (117 lb)   SpO2 100%   Breastfeeding No   BMI 19.17 kg/m     Lab Results:  Recent Results (from the past 12 hour(s))   POC CHEM8    Collection Time: 10/15/18  9:22 AM   Result Value Ref Range    Calcium, ionized (POC) 1.23 1.12 - 1.32 mmol/L    Sodium (POC) 135 (L) 136 - 145 mmol/L    Potassium (POC) 4.7 3.5 - 5.1 mmol/L    Chloride (POC) 99 98 - 107 mmol/L    CO2 (POC) 28 21 - 32 mmol/L    Anion gap (POC) 14 10 - 20 mmol/L    Glucose (POC) 73 65 - 100 mg/dL    BUN (POC) 9 9 - 20 mg/dL    Creatinine (POC) 0.4 (L) 0.6 - 1.3 mg/dL    GFRAA, POC >39 >39 fo/fpw/8.26f7    GFRNA, POC >60 >60 ml/min/1.53m2    Hematocrit (POC) 37 35.0 - 47.0 %    Comment Comment Not Indicated.       Medications:  Prolia  SQ in RUA    0945 Patient tolerated treatment well. Patient discharged from Outpatient Infusion Center ambulatory in no distress at 0945. Patient aware of next appointment.    Future Appointments   Date Time Provider Department Center   12/20/2018  9:00 AM Bernardine Pagoda, MD [facility] [place] SCHED   04/12/2019  9:00 AM [facility] INF7 CH3 <1H [facility]   08/27/2019  9:30 AM Celena Elsie PARAS, MD [facility] SCHED

## 2018-10-15 NOTE — Progress Notes (Signed)
Outpatient Infusion Center Short Visit Progress Note    0930 Patient admitted to [facility] for Prolia ambulatory in stable condition. Assessment completed. No new concerns voiced.     Labs drawn by PCT and sent for processing.     Results are within parameters to treat.    Patient states she has not had any invasive dental procedures within the last 6-8 weeks nor any scheduled in the next 6-8 weeks.    Vital Signs:  Visit Vitals  BP 100/61   Pulse 71   Temp 98.4 ??F (36.9 ??C)   Resp 18   Wt 53.1 kg (117 lb)   SpO2 100%   Breastfeeding No   BMI 19.17 kg/m??     Lab Results:  Recent Results (from the past 12 hour(s))   POC CHEM8    Collection Time: 10/15/18  9:22 AM   Result Value Ref Range    Calcium, ionized (POC) 1.23 1.12 - 1.32 mmol/L    Sodium (POC) 135 (L) 136 - 145 mmol/L    Potassium (POC) 4.7 3.5 - 5.1 mmol/L    Chloride (POC) 99 98 - 107 mmol/L    CO2 (POC) 28 21 - 32 mmol/L    Anion gap (POC) 14 10 - 20 mmol/L    Glucose (POC) 73 65 - 100 mg/dL    BUN (POC) 9 9 - 20 mg/dL    Creatinine (POC) 0.4 (L) 0.6 - 1.3 mg/dL    GFRAA, POC >60 >60 ml/min/1.81m2    GFRNA, POC >60 >60 ml/min/1.52m2    Hematocrit (POC) 37 35.0 - 47.0 %    Comment Comment Not Indicated.       Medications:  Prolia SQ in RUA    0945 Patient tolerated treatment well. Patient discharged from [facility] ambulatory in no distress at 0945. Patient aware of next appointment.    Future Appointments   Date Time Provider [place]   12/20/2018  9:00 AM Albertha Ghee, MD [place]   04/12/2019  9:00 AM [facility] INF7 CH3 <1H [facility]   08/27/2019  9:30 AM Joyce Gross, MD [place]

## 2018-10-24 ENCOUNTER — Emergency Department: Admit: 2018-10-24 | Payer: MEDICARE | Primary: Family Medicine

## 2018-10-24 ENCOUNTER — Inpatient Hospital Stay
Admit: 2018-10-24 | Discharge: 2018-10-24 | Disposition: A | Discharge status: Home / Self Care | Payer: MEDICARE | Attending: Emergency Medicine

## 2018-10-24 DIAGNOSIS — B029 Zoster without complications: Secondary | ICD-10-CM

## 2018-10-24 LAB — CBC WITH AUTO DIFFERENTIAL
Basophils %: 1 % (ref 0–1)
Basophils Absolute: 0 10*3/uL (ref 0.0–0.1)
Eosinophils %: 3 % (ref 0–7)
Eosinophils Absolute: 0.2 10*3/uL (ref 0.0–0.4)
Granulocyte Absolute Count: 0 10*3/uL (ref 0.00–0.04)
Hematocrit: 41.7 % (ref 35.0–47.0)
Hemoglobin: 13.5 g/dL (ref 11.5–16.0)
Immature Granulocytes: 0 % (ref 0.0–0.5)
Lymphocytes %: 30 % (ref 12–49)
Lymphocytes Absolute: 1.8 10*3/uL (ref 0.8–3.5)
MCH: 30 PG (ref 26.0–34.0)
MCHC: 32.4 g/dL (ref 30.0–36.5)
MCV: 92.7 FL (ref 80.0–99.0)
MPV: 9.4 FL (ref 8.9–12.9)
Monocytes %: 9 % (ref 5–13)
Monocytes Absolute: 0.5 10*3/uL (ref 0.0–1.0)
NRBC Absolute: 0 10*3/uL (ref 0.00–0.01)
Neutrophils %: 57 % (ref 32–75)
Neutrophils Absolute: 3.5 10*3/uL (ref 1.8–8.0)
Nucleated RBCs: 0 PER 100 WBC
Platelets: 275 10*3/uL (ref 150–400)
RBC: 4.5 M/uL (ref 3.80–5.20)
RDW: 13.4 % (ref 11.5–14.5)
WBC: 6 10*3/uL (ref 3.6–11.0)

## 2018-10-24 LAB — COMPREHENSIVE METABOLIC PANEL
ALT: 39 U/L (ref 12–78)
AST: 29 U/L (ref 15–37)
Albumin/Globulin Ratio: 1.4 (ref 1.1–2.2)
Albumin: 4.2 g/dL (ref 3.5–5.0)
Alkaline Phosphatase: 149 U/L — ABNORMAL HIGH (ref 45–117)
Anion Gap: 2 mmol/L — ABNORMAL LOW (ref 5–15)
BUN: 8 MG/DL (ref 6–20)
Bun/Cre Ratio: 17 (ref 12–20)
CO2: 29 mmol/L (ref 21–32)
Calcium: 8 MG/DL — ABNORMAL LOW (ref 8.5–10.1)
Chloride: 106 mmol/L (ref 97–108)
Creatinine: 0.48 MG/DL — ABNORMAL LOW (ref 0.55–1.02)
EGFR IF NonAfrican American: >60 mL/min/{1.73_m2} (ref >60)
GFR African American: >60 mL/min/{1.73_m2} (ref >60)
Globulin: 3.1 g/dL (ref 2.0–4.0)
Glucose: 85 mg/dL (ref 65–100)
Potassium: 4.2 mmol/L (ref 3.5–5.1)
Sodium: 137 mmol/L (ref 136–145)
Total Bilirubin: 0.3 MG/DL (ref 0.2–1.0)
Total Protein: 7.3 g/dL (ref 6.4–8.2)

## 2018-10-24 LAB — URINALYSIS WITH MICROSCOPIC
BACTERIA, URINE: NEGATIVE /hpf
Bilirubin, Urine: NEGATIVE
Blood, Urine: NEGATIVE
Glucose, Ur: NEGATIVE mg/dL
Ketones, Urine: NEGATIVE mg/dL
Leukocyte Esterase, Urine: NEGATIVE
Nitrite, Urine: NEGATIVE
Protein, UA: NEGATIVE mg/dL
Specific Gravity, UA: <1.005 NA (ref 1.003–1.030)
Urobilinogen, UA, POCT: 0.2 EU/dL (ref 0.2–1.0)
pH, UA: 8.5 — ABNORMAL HIGH (ref 5.0–8.0)

## 2018-10-24 LAB — LIPASE
Lipase: 139 U/L (ref 73–393)
Lipase: 139 U/L (ref 73–393)

## 2018-10-24 LAB — URINALYSIS W/MICROSCOPIC
Bacteria: NEGATIVE /hpf
Bilirubin: NEGATIVE
Blood: NEGATIVE
Glucose: NEGATIVE mg/dL
Ketone: NEGATIVE mg/dL
Leukocyte Esterase: NEGATIVE
Nitrites: NEGATIVE
Protein: NEGATIVE mg/dL
Specific gravity: <1.005 (ref 1.003–1.030)
Urobilinogen: 0.2 EU/dL (ref 0.2–1.0)
pH (UA): 8.5 — ABNORMAL HIGH (ref 5.0–8.0)

## 2018-10-24 LAB — METABOLIC PANEL, COMPREHENSIVE
A-G Ratio: 1.4 (ref 1.1–2.2)
ALT (SGPT): 39 U/L (ref 12–78)
AST (SGOT): 29 U/L (ref 15–37)
Albumin: 4.2 g/dL (ref 3.5–5.0)
Alk. phosphatase: 149 U/L — ABNORMAL HIGH (ref 45–117)
Anion gap: 2 mmol/L — ABNORMAL LOW (ref 5–15)
BUN/Creatinine ratio: 17 (ref 12–20)
BUN: 8 MG/DL (ref 6–20)
Bilirubin, total: 0.3 MG/DL (ref 0.2–1.0)
CO2: 29 mmol/L (ref 21–32)
Calcium: 8 MG/DL — ABNORMAL LOW (ref 8.5–10.1)
Chloride: 106 mmol/L (ref 97–108)
Creatinine: 0.48 MG/DL — ABNORMAL LOW (ref 0.55–1.02)
GFR est AA: >60 mL/min/{1.73_m2} (ref >60)
GFR est non-AA: >60 mL/min/{1.73_m2} (ref >60)
Globulin: 3.1 g/dL (ref 2.0–4.0)
Glucose: 85 mg/dL (ref 65–100)
Potassium: 4.2 mmol/L (ref 3.5–5.1)
Protein, total: 7.3 g/dL (ref 6.4–8.2)
Sodium: 137 mmol/L (ref 136–145)

## 2018-10-24 LAB — CBC WITH AUTOMATED DIFF
ABS. BASOPHILS: 0 10*3/uL (ref 0.0–0.1)
ABS. EOSINOPHILS: 0.2 10*3/uL (ref 0.0–0.4)
ABS. IMM. GRANS.: 0 10*3/uL (ref 0.00–0.04)
ABS. LYMPHOCYTES: 1.8 10*3/uL (ref 0.8–3.5)
ABS. MONOCYTES: 0.5 10*3/uL (ref 0.0–1.0)
ABS. NEUTROPHILS: 3.5 10*3/uL (ref 1.8–8.0)
ABSOLUTE NRBC: 0 10*3/uL (ref 0.00–0.01)
BASOPHILS: 1 % (ref 0–1)
EOSINOPHILS: 3 % (ref 0–7)
HCT: 41.7 % (ref 35.0–47.0)
HGB: 13.5 g/dL (ref 11.5–16.0)
IMMATURE GRANULOCYTES: 0 % (ref 0.0–0.5)
LYMPHOCYTES: 30 % (ref 12–49)
MCH: 30 PG (ref 26.0–34.0)
MCHC: 32.4 g/dL (ref 30.0–36.5)
MCV: 92.7 FL (ref 80.0–99.0)
MONOCYTES: 9 % (ref 5–13)
MPV: 9.4 FL (ref 8.9–12.9)
NEUTROPHILS: 57 % (ref 32–75)
NRBC: 0 PER 100 WBC
PLATELET: 275 10*3/uL (ref 150–400)
RBC: 4.5 M/uL (ref 3.80–5.20)
RDW: 13.4 % (ref 11.5–14.5)
WBC: 6 10*3/uL (ref 3.6–11.0)

## 2018-10-24 LAB — URINE CULTURE HOLD SAMPLE

## 2018-10-24 MED ORDER — VALACYCLOVIR 1 G TAB
1 gram | ORAL_TABLET | Freq: Three times a day (TID) | ORAL | 0 refills | Status: AC
Start: 2018-10-24 — End: 2018-10-31

## 2018-10-24 MED ORDER — MORPHINE 2 MG/ML INJECTION
2 mg/mL | Freq: Once | INTRAMUSCULAR | Status: AC
Start: 2018-10-24 — End: 2018-10-24
  Administered 2018-10-24: 14:00:00 via INTRAVENOUS

## 2018-10-24 MED ORDER — OXYCODONE 5 MG TAB
5 mg | ORAL_TABLET | Freq: Four times a day (QID) | ORAL | 0 refills | Status: AC | PRN
Start: 2018-10-24 — End: 2018-10-27

## 2018-10-24 MED ORDER — SODIUM CHLORIDE 0.9% BOLUS IV
0.9 % | Freq: Once | INTRAVENOUS | Status: AC
Start: 2018-10-24 — End: 2018-10-24
  Administered 2018-10-24: 14:00:00 via INTRAVENOUS

## 2018-10-24 MED FILL — MORPHINE 2 MG/ML INJECTION: 2 mg/mL | INTRAMUSCULAR | Qty: 2

## 2018-10-24 MED FILL — SODIUM CHLORIDE 0.9 % IV: INTRAVENOUS | Qty: 1000

## 2018-10-24 NOTE — ED Provider Notes (Signed)
ED Provider Notes by Marinda Elk, NP at 10/24/18 0901                Author: Marinda Elk, NP  Service: Emergency Medicine  Author Type: Nurse Practitioner       Filed: 10/24/18 1625  Date of Service: 10/24/18 0901  Status: Attested           Editor: Vesta Wheeland, Wannetta Sender, NP (Nurse Practitioner)  Cosigner: Nino Glow, MD at 10/25/18 1125          Attestation signed by Nino Glow, MD at 10/25/18 1125          I was personally available for consultation in the emergency department.  I have reviewed the chart and agree with the documentation recorded by the [facility], including  the assessment, treatment plan, and disposition.   Nino Glow, MD                                    This is a 68 year old female who presents ambulatory to the emergency room with complaints of left flank pain radiating  into the left groin.  Patient states the pain started on Sunday and has worsened.  Patient took 5 mg of oxycodone with no relief, prompting an emergency room visit stating the pain is unbearable.  States the pain is a 9-10 out of 10 and feels like a "knife."  Follows along dermatone. Denies any urinary symptoms, urinary urgency or frequency, hematuria.  There are no further complaints at this time.      Berline Lopes, MD   Past Medical History:   No date: Anxiety   No date: Arthritis   No date: Basal cell carcinoma   No date: Cancer [facility])       Comment:  left breast  cancer   No date: Chronic tension headaches   2011: Concussion   No date: Headache(784.0)       Comment:  OCCAS   No date: Hx of radiation therapy       Comment:  COMPLETED IN 2014   No date: Ill-defined condition       Comment:  cellulitis L UE   No date: Lymphedema of arm       Comment:  left   No date: Nausea & vomiting   No date: Other and unspecified symptoms and signs involving general    sensations and perceptions       Comment:  HIVES RECENTLY- WAS IN ER Feb 19, 2013   No date: Other ill-defined conditions(799.89)        Comment:  PHEOCHROMOCYTOMA   No date: S/P chemotherapy, time since greater than 12 weeks       Comment:  COMPLETED CHEMOTHERAPY IN JUNE 2014   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   Past Surgical History:   No date: ABDOMEN SURGERY PROC UNLISTED       Comment:  hernia mesh repair   No date: ABDOMEN SURGERY PROC UNLISTED       Comment:  right adenal removed   '91: BREAST SURGERY PROCEDURE UNLISTED       Comment:  breast implants; replaced bilat implants 2011   No date: HX APPENDECTOMY   04/22/2014: HX BREAST AUGMENTATION; Left       Comment:  REMOVAL OF LEFT BREAST IMPLANT, WASHOUT AND CLOSURE OF  LEFT BREAST WOUND performed by Sharlotte Alamo, MD at [facility]                 MAIN OR   11/28/2012: HX BREAST RECONSTRUCTION       Comment:  BREAST RECONSTRUCTION /C  INSERTION EXPANDER & ALLODERM                 performed by Sharlotte Alamo, MD at [place]   09/16/2013: HX BREAST RECONSTRUCTION; Bilateral       Comment:  REMOVAL AND REPLACEMENT LEFT BREAST IMPLANT TISSUE                 EXPANDERS WITH SUBTOTAL CAPSULECTOMY, RIGHT BREAST                 IMPLANT REMOVAL AND REPLACEMENT FOR SYMMETRY/FAT GRAFTING                TO BILATERAL BREASTS performed by Sharlotte Alamo, MD at                 [facility] MAIN OR   No date: HX GYN       Comment:  c-section   No date: HX HEENT       Comment:  T&A   No date: HX HEENT       Comment:  right adrenal removed   No date: HX MASTECTOMY; Left   11/28/2012: HX MODIFIED RADICAL MASTECTOMY       Comment:  LEFT BREAST MODIFIED RADICAL MASTECTOMY WITH AXILLARY                 DISSECTION W/ RECONSTRUCTION LEFT BREAST WITH TISSUE                 EXPANDER AND ALLODERM;PORTACATH INSERTION performed by                 Ty Hilts, MD at [place]   No date: HX OOPHORECTOMY       Comment:  right   No date: HX ORTHOPAEDIC       Comment:  bilateral shoulder arthroscopy   No date: HX VASCULAR ACCESS       Comment:  PORT, THEN REMOVED                     Past  Medical History:        Diagnosis  Date         ?  Anxiety       ?  Arthritis       ?  Basal cell carcinoma       ?  Cancer [facility])            left breast  cancer         ?  Chronic tension headaches       ?  Concussion  2011     ?  Headache(784.0)            OCCAS         ?  Hx of radiation therapy            COMPLETED IN 2014         ?  Ill-defined condition            cellulitis L UE         ?  Lymphedema of arm            left         ?  Nausea & vomiting       ?  Other and unspecified symptoms and signs involving general sensations and perceptions            HIVES RECENTLY- WAS IN ER Feb 19, 2013         ?  Other ill-defined conditions(799.89)            PHEOCHROMOCYTOMA         ?  S/P chemotherapy, time since greater than 12 weeks            COMPLETED CHEMOTHERAPY IN JUNE 2014         ?  SCCA (squamous cell carcinoma) of skin               Past Surgical History:         Procedure  Laterality  Date          ?  ABDOMEN SURGERY PROC UNLISTED              hernia mesh repair          ?  ABDOMEN SURGERY PROC UNLISTED              right adenal removed          ?  BREAST SURGERY PROCEDURE UNLISTED    '91          breast implants; replaced bilat implants 2011          ?  HX APPENDECTOMY         ?  HX BREAST AUGMENTATION  Left  04/22/2014          REMOVAL OF LEFT BREAST IMPLANT, WASHOUT AND CLOSURE OF LEFT BREAST WOUND performed by Sharlotte Alamo, MD at [place]          ?  HX BREAST RECONSTRUCTION    11/28/2012          BREAST RECONSTRUCTION /C  INSERTION EXPANDER & ALLODERM performed by Sharlotte Alamo, MD at [place]          ?  HX BREAST RECONSTRUCTION  Bilateral  09/16/2013          REMOVAL AND REPLACEMENT LEFT BREAST IMPLANT TISSUE EXPANDERS WITH SUBTOTAL CAPSULECTOMY, RIGHT BREAST IMPLANT REMOVAL AND REPLACEMENT FOR SYMMETRY/FAT  GRAFTING TO BILATERAL BREASTS performed by Sharlotte Alamo, MD at [place]          ?  HX GYN              c-section          ?  HX HEENT              T&A          ?  HX HEENT               right adrenal removed          ?  HX MASTECTOMY  Left       ?  HX MODIFIED RADICAL MASTECTOMY    11/28/2012          LEFT BREAST MODIFIED RADICAL MASTECTOMY WITH AXILLARY DISSECTION W/ RECONSTRUCTION LEFT BREAST WITH TISSUE EXPANDER AND ALLODERM;PORTACATH INSERTION  performed by Ty Hilts, MD at [place]          ?  HX OOPHORECTOMY              right          ?  HX ORTHOPAEDIC              bilateral shoulder arthroscopy          ?  HX VASCULAR ACCESS              PORT, THEN REMOVED               Family History:         Problem  Relation  Age of Onset          ?  Alzheimer  Mother       ?  Diabetes  Mother       ?  Other  Mother                COMBATIVE POST ANESTHESIA          ?  Heart Disease  Father       ?  Diabetes  Father       ?  Lung Disease  Father                COPD          ?  Headache  Sister       ?  Migraines  Sister            ?  Anesth Problems  Neg Hx               Social History          Socioeconomic History         ?  Marital status:  SINGLE              Spouse name:  Not on file         ?  Number of children:  Not on file     ?  Years of education:  Not on file     ?  Highest education level:  Not on file       Occupational History        ?  Not on file       Social Needs         ?  Financial resource strain:  Not on file        ?  Food insecurity:              Worry:  Not on file         Inability:  Not on file        ?  Transportation needs:              Medical:  Not on file         Non-medical:  Not on file       Tobacco Use         ?  Smoking status:  Never Smoker     ?  Smokeless tobacco:  Never Used       Substance and Sexual Activity         ?  Alcohol use:  No     ?  Drug use:  No     ?  Sexual activity:  Not on file       Lifestyle        ?  Physical activity:              Days per week:  Not on file         Minutes per session:  Not on file         ?  Stress:  Not on file       Relationships        ?  Social connections:              Talks on phone:  Not  on file         Gets together:  Not on file         Attends religious service:  Not on file         Active member of club or organization:  Not on file         Attends meetings of clubs or organizations:  Not on file         Relationship status:  Not on file        ?  Intimate partner violence:              Fear of current or ex partner:  Not on file         Emotionally abused:  Not on file         Physically abused:  Not on file         Forced sexual activity:  Not on file        Other Topics  Concern        ?  Not on file       Social History Narrative        ?  Not on file              ALLERGIES: Aspirin; Bacitracin; Compazine [prochlorperazine]; Keflex [cephalexin]; and Nsaids (non-steroidal anti-inflammatory  drug)      Review of Systems    Constitutional: Negative for appetite change, chills, diaphoresis, fatigue and fever.    HENT: Negative for congestion, ear discharge, ear pain, sinus pressure, sinus pain, sore throat and trouble swallowing.     Eyes: Negative for photophobia, pain, redness and visual disturbance.    Respiratory: Negative for chest tightness, shortness of breath and wheezing.     Cardiovascular: Negative for chest pain and palpitations.    Gastrointestinal: Negative for abdominal distention, abdominal pain, nausea and vomiting.    Endocrine: Negative.     Genitourinary: Positive for flank pain. Negative for difficulty urinating, frequency, hematuria and urgency.    Musculoskeletal: Negative for back pain, neck pain and neck stiffness.    Skin: Negative for color change, pallor, rash and wound.    Allergic/Immunologic: Negative.     Neurological: Negative for dizziness, speech difficulty, weakness and headaches.    Hematological: Does not bruise/bleed easily.    Psychiatric/Behavioral: Negative for behavioral problems. The patient is not nervous/anxious.             Vitals:          10/24/18 0859        BP:  159/90     Pulse:  83     Resp:  16     Temp:  98.2 ??F (36.8 ??C)        SpO2:  98%                 Physical Exam   Vitals signs and nursing note reviewed. Exam conducted with a chaperone present.   Constitutional:        General: She is not in acute distress.     Appearance: Normal appearance. She is well-developed.    HENT:       Head: Normocephalic and atraumatic.      Right Ear: External ear normal.  Left Ear: External ear normal.      Nose: Nose normal.      Mouth/Throat:      Mouth: Mucous membranes are moist.   Eyes:       General:         Right eye: No discharge.         Left eye: No discharge.      Conjunctiva/sclera: Conjunctivae normal.      Pupils: Pupils are equal, round, and reactive to light.   Neck:       Musculoskeletal: Normal range of motion and neck supple.      Vascular: No JVD.      Trachea: No tracheal deviation.    Cardiovascular:       Rate and Rhythm: Normal rate and regular rhythm.      Heart sounds: Normal heart sounds. No murmur. No gallop.     Pulmonary:       Effort: Pulmonary effort is normal. No respiratory distress.      Breath sounds: Normal breath sounds. No wheezing or rales.   Chest:       Chest wall: No tenderness.   Abdominal :      General: Bowel sounds are normal. There is no distension.      Palpations: Abdomen is soft.      Tenderness: There is no tenderness. There is no guarding or rebound.     Musculoskeletal: Normal range of motion.          General: Tenderness (left flank)  present.    Skin:      General: Skin is warm and dry.      Coloration: Skin is not pale.      Findings: Rash  (left flank with few small eruptions consistent with shingles) present. No erythema.   Neurological:       Mental Status: She is alert and oriented to person, place, and time.    Psychiatric:         Behavior: Behavior normal.         Thought Content: Thought content normal.         Judgment: Judgment normal.              MDM   Number of Diagnoses or Management Options   Herpes zoster without complication: new and requires workup   Diagnosis management comments: DDX:    Kidney stone, Pyelo, UTI, shingles      Plan:   Discharged home on antivirals and pain medication.   Follow-up with PCP.   Return to the emergency room with worsening symptoms.   Patient in agreement with plan of care.          Amount and/or Complexity of Data Reviewed   Clinical lab tests: ordered and reviewed   Tests in the radiology section of CPT??: ordered and reviewed    Discuss the patient with other providers: yes (Discussed with Dr. Cornelia Copa   )             10:31 AM   Pt has been reexamined.  Pt has no new complaints, changes or physical findings.   Discussed plan of care with Dr. Cornelia Copa who is in agreement with antivirals.    Care plan outlined and precautions discussed. All available results were reviewed with pt. All medications were reviewed with pt. All of pt's questions and concerns were addressed. Pt agrees to F/U as instructed and agrees to return to ED upon further  deterioration. Pt is ready  to go home.   Marinda Elk, NP         Procedures

## 2018-10-24 NOTE — ED Notes (Signed)
Co left sided lower back pain that started Sunday radiating into groin.  Took 5 mg oxycodone without relief.  Denies urinary symptoms.

## 2018-10-24 NOTE — ED Notes (Signed)
Patient given discharge instruction and verbalized understanding. Pt ambulatory out of ER with husband with steady gait

## 2018-10-24 NOTE — ED Notes (Signed)
Pt reports decreased in pain but not pain free.  Requested update on CT results and informed her that ~30 min. Before we know results

## 2018-10-24 NOTE — ED Provider Notes (Signed)
This is a 68 year old female who presents ambulatory to the emergency room with complaints of left flank pain radiating into the left groin.  Patient states the pain started on Sunday and has worsened.  Patient took 5 mg of oxycodone with no relief, prompting an emergency room visit stating the pain is unbearable.  States the pain is a 9-10 out of 10 and feels like a "knife." Follows along dermatone. Denies any urinary symptoms, urinary urgency or frequency, hematuria.  There are no further complaints at this time.    Berline Lopes, MD  Past Medical History:  No date: Anxiety  No date: Arthritis  No date: Basal cell carcinoma  No date: Cancer [facility])      Comment:  left breast  cancer  No date: Chronic tension headaches  2011: Concussion  No date: Headache(784.0)      Comment:  OCCAS  No date: Hx of radiation therapy      Comment:  COMPLETED IN 2014  No date: Ill-defined condition      Comment:  cellulitis L UE  No date: Lymphedema of arm      Comment:  left  No date: Nausea & vomiting  No date: Other and unspecified symptoms and signs involving general   sensations and perceptions      Comment:  HIVES RECENTLY- WAS IN ER Feb 19, 2013  No date: Other ill-defined conditions(799.89)      Comment:  PHEOCHROMOCYTOMA  No date: S/P chemotherapy, time since greater than 12 weeks      Comment:  COMPLETED CHEMOTHERAPY IN JUNE 2014  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  Past Surgical History:  No date: ABDOMEN SURGERY PROC UNLISTED      Comment:  hernia mesh repair  No date: ABDOMEN SURGERY PROC UNLISTED      Comment:  right adenal removed  '91: BREAST SURGERY PROCEDURE UNLISTED      Comment:  breast implants; replaced bilat implants 2011  No date: HX APPENDECTOMY  04/22/2014: HX BREAST AUGMENTATION; Left      Comment:  REMOVAL OF LEFT BREAST IMPLANT, WASHOUT AND CLOSURE OF                LEFT BREAST WOUND performed by Sharlotte Alamo, MD at [place]  11/28/2012: HX BREAST RECONSTRUCTION       Comment:  BREAST RECONSTRUCTION /C  INSERTION EXPANDER & ALLODERM                performed by Sharlotte Alamo, MD at [place]  09/16/2013: HX BREAST RECONSTRUCTION; Bilateral      Comment:  REMOVAL AND REPLACEMENT LEFT BREAST IMPLANT TISSUE                EXPANDERS WITH SUBTOTAL CAPSULECTOMY, RIGHT BREAST                IMPLANT REMOVAL AND REPLACEMENT FOR SYMMETRY/FAT GRAFTING               TO BILATERAL BREASTS performed by Sharlotte Alamo, MD at                [facility] MAIN OR  No date: HX GYN      Comment:  c-section  No date: HX HEENT      Comment:  T&A  No date: HX HEENT      Comment:  right adrenal removed  No date: HX MASTECTOMY; Left  11/28/2012: HX MODIFIED RADICAL MASTECTOMY      Comment:  LEFT BREAST MODIFIED RADICAL MASTECTOMY WITH AXILLARY                DISSECTION W/ RECONSTRUCTION LEFT BREAST WITH TISSUE                EXPANDER AND ALLODERM;PORTACATH INSERTION performed by                Ty Hilts, MD at [place]  No date: HX OOPHORECTOMY      Comment:  right  No date: HX ORTHOPAEDIC      Comment:  bilateral shoulder arthroscopy  No date: HX VASCULAR ACCESS      Comment:  PORT, THEN REMOVED             Past Medical History:   Diagnosis Date   ??? Anxiety    ??? Arthritis    ??? Basal cell carcinoma    ??? Cancer ([place])     left breast  cancer   ??? Chronic tension headaches    ??? Concussion 2011   ??? Headache(784.0)     OCCAS   ??? Hx of radiation therapy     COMPLETED IN 2014   ??? Ill-defined condition     cellulitis L UE   ??? Lymphedema of arm     left   ??? Nausea & vomiting    ??? Other and unspecified symptoms and signs involving general sensations and perceptions     HIVES RECENTLY- WAS IN ER Feb 19, 2013   ??? Other ill-defined conditions(799.89)     PHEOCHROMOCYTOMA   ??? S/P chemotherapy, time since greater than 12 weeks     COMPLETED CHEMOTHERAPY IN JUNE 2014   ??? SCCA (squamous cell carcinoma) of skin        Past Surgical History:   Procedure Laterality Date    ??? ABDOMEN SURGERY PROC UNLISTED      hernia mesh repair   ??? ABDOMEN SURGERY PROC UNLISTED      right adenal removed   ??? BREAST SURGERY PROCEDURE UNLISTED  '91    breast implants; replaced bilat implants 2011   ??? HX APPENDECTOMY     ??? HX BREAST AUGMENTATION Left 04/22/2014    REMOVAL OF LEFT BREAST IMPLANT, WASHOUT AND CLOSURE OF LEFT BREAST WOUND performed by Sharlotte Alamo, MD at [place]   ??? HX BREAST RECONSTRUCTION  11/28/2012    BREAST RECONSTRUCTION /C  INSERTION EXPANDER & ALLODERM performed by Sharlotte Alamo, MD at [place]   ??? HX BREAST RECONSTRUCTION Bilateral 09/16/2013    REMOVAL AND REPLACEMENT LEFT BREAST IMPLANT TISSUE EXPANDERS WITH SUBTOTAL CAPSULECTOMY, RIGHT BREAST IMPLANT REMOVAL AND REPLACEMENT FOR SYMMETRY/FAT GRAFTING TO BILATERAL BREASTS performed by Sharlotte Alamo, MD at [place]   ??? HX GYN      c-section   ??? HX HEENT      T&A   ??? HX HEENT      right adrenal removed   ??? HX MASTECTOMY Left    ??? HX MODIFIED RADICAL MASTECTOMY  11/28/2012    LEFT BREAST MODIFIED RADICAL MASTECTOMY WITH AXILLARY DISSECTION W/ RECONSTRUCTION LEFT BREAST WITH TISSUE EXPANDER AND ALLODERM;PORTACATH INSERTION performed by Ty Hilts, MD at [place]   ??? HX OOPHORECTOMY      right   ??? HX ORTHOPAEDIC      bilateral shoulder arthroscopy   ???  HX VASCULAR ACCESS      PORT, THEN REMOVED         Family History:   Problem Relation Age of Onset   ??? Alzheimer Mother    ??? Diabetes Mother    ??? Other Mother         COMBATIVE POST ANESTHESIA   ??? Heart Disease Father    ??? Diabetes Father    ??? Lung Disease Father         COPD   ??? Headache Sister    ??? Migraines Sister    ??? Anesth Problems Neg Hx        Social History     Socioeconomic History   ??? Marital status: SINGLE     Spouse name: Not on file   ??? Number of children: Not on file   ??? Years of education: Not on file   ??? Highest education level: Not on file   Occupational History   ??? Not on file   Social Needs   ??? Financial resource strain: Not on file    ??? Food insecurity:     Worry: Not on file     Inability: Not on file   ??? Transportation needs:     Medical: Not on file     Non-medical: Not on file   Tobacco Use   ??? Smoking status: Never Smoker   ??? Smokeless tobacco: Never Used   Substance and Sexual Activity   ??? Alcohol use: No   ??? Drug use: No   ??? Sexual activity: Not on file   Lifestyle   ??? Physical activity:     Days per week: Not on file     Minutes per session: Not on file   ??? Stress: Not on file   Relationships   ??? Social connections:     Talks on phone: Not on file     Gets together: Not on file     Attends religious service: Not on file     Active member of club or organization: Not on file     Attends meetings of clubs or organizations: Not on file     Relationship status: Not on file   ??? Intimate partner violence:     Fear of current or ex partner: Not on file     Emotionally abused: Not on file     Physically abused: Not on file     Forced sexual activity: Not on file   Other Topics Concern   ??? Not on file   Social History Narrative   ??? Not on file         ALLERGIES: Aspirin; Bacitracin; Compazine [prochlorperazine]; Keflex [cephalexin]; and Nsaids (non-steroidal anti-inflammatory drug)    Review of Systems   Constitutional: Negative for appetite change, chills, diaphoresis, fatigue and fever.   HENT: Negative for congestion, ear discharge, ear pain, sinus pressure, sinus pain, sore throat and trouble swallowing.    Eyes: Negative for photophobia, pain, redness and visual disturbance.   Respiratory: Negative for chest tightness, shortness of breath and wheezing.    Cardiovascular: Negative for chest pain and palpitations.   Gastrointestinal: Negative for abdominal distention, abdominal pain, nausea and vomiting.   Endocrine: Negative.    Genitourinary: Positive for flank pain. Negative for difficulty urinating, frequency, hematuria and urgency.   Musculoskeletal: Negative for back pain, neck pain and neck stiffness.    Skin: Negative for color change, pallor, rash and wound.   Allergic/Immunologic: Negative.  Neurological: Negative for dizziness, speech difficulty, weakness and headaches.   Hematological: Does not bruise/bleed easily.   Psychiatric/Behavioral: Negative for behavioral problems. The patient is not nervous/anxious.        Vitals:    10/24/18 0859   BP: 159/90   Pulse: 83   Resp: 16   Temp: 98.2 ??F (36.8 ??C)   SpO2: 98%            Physical Exam  Vitals signs and nursing note reviewed. Exam conducted with a chaperone present.   Constitutional:       General: She is not in acute distress.     Appearance: Normal appearance. She is well-developed.   HENT:      Head: Normocephalic and atraumatic.      Right Ear: External ear normal.      Left Ear: External ear normal.      Nose: Nose normal.      Mouth/Throat:      Mouth: Mucous membranes are moist.   Eyes:      General:         Right eye: No discharge.         Left eye: No discharge.      Conjunctiva/sclera: Conjunctivae normal.      Pupils: Pupils are equal, round, and reactive to light.   Neck:      Musculoskeletal: Normal range of motion and neck supple.      Vascular: No JVD.      Trachea: No tracheal deviation.   Cardiovascular:      Rate and Rhythm: Normal rate and regular rhythm.      Heart sounds: Normal heart sounds. No murmur. No gallop.    Pulmonary:      Effort: Pulmonary effort is normal. No respiratory distress.      Breath sounds: Normal breath sounds. No wheezing or rales.   Chest:      Chest wall: No tenderness.   Abdominal:      General: Bowel sounds are normal. There is no distension.      Palpations: Abdomen is soft.      Tenderness: There is no tenderness. There is no guarding or rebound.   Musculoskeletal: Normal range of motion.         General: Tenderness (left flank) present.   Skin:     General: Skin is warm and dry.      Coloration: Skin is not pale.      Findings: Rash (left flank with few small eruptions consistent with  shingles) present. No erythema.   Neurological:      Mental Status: She is alert and oriented to person, place, and time.   Psychiatric:         Behavior: Behavior normal.         Thought Content: Thought content normal.         Judgment: Judgment normal.          MDM  Number of Diagnoses or Management Options  Herpes zoster without complication: new and requires workup  Diagnosis management comments: DDX:  Kidney stone, Pyelo, UTI, shingles    Plan:  Discharged home on antivirals and pain medication.  Follow-up with PCP.  Return to the emergency room with worsening symptoms.  Patient in agreement with plan of care.       Amount and/or Complexity of Data Reviewed  Clinical lab tests: ordered and reviewed  Tests in the radiology section of CPT??: ordered and reviewed  Discuss the patient  with other providers: yes (Discussed with Dr. Cornelia Copa  )         10:31 AM  Pt has been reexamined.  Pt has no new complaints, changes or physical findings.  Discussed plan of care with Dr. Cornelia Copa who is in agreement with antivirals.   Care plan outlined and precautions discussed. All available results were reviewed with pt. All medications were reviewed with pt. All of pt's questions and concerns were addressed. Pt agrees to F/U as instructed and agrees to return to ED upon further deterioration. Pt is ready to go home.  Marinda Elk, NP      Procedures

## 2018-10-24 NOTE — ED Triage Notes (Signed)
Co left sided lower back pain that started Sunday radiating into groin.  Took 5 mg oxycodone without relief.  Denies urinary symptoms.

## 2018-10-25 ENCOUNTER — Encounter

## 2018-10-25 MED ORDER — ALENDRONATE 70 MG TAB
70 mg | ORAL_TABLET | ORAL | 2 refills | Status: DC
Start: 2018-10-25 — End: 2019-01-15

## 2018-10-25 NOTE — Telephone Encounter (Signed)
Called patient, left VM.

## 2018-10-25 NOTE — Telephone Encounter (Signed)
Patient was calling you back    Cb # [phone]

## 2018-10-25 NOTE — Telephone Encounter (Signed)
Returned called, left VM

## 2018-10-25 NOTE — Telephone Encounter (Signed)
10/25/2018 1:06 PM: Returned call to patient, who stated that she received Prolia three weeks ago and began having intermittent pain one week after. Patient stated that since Sunday, the pain has been "chronic and feels like stabbing in her side." Patient stated, "I can't sit or lie down comfortably." Patient also stated that she is "very sensitive to meds." Advised patient that per [name] and Julianne Rice, NP, she switch to Fosamax if she would like. Patient stated, "Didn't clinical trials show that Fosamax is an overkill and causes brittle bones?" Patient stated she would like to talk to [name] or NP about this because she "does not want to break a bone." Advised that NP is not currently available but will call her back. Patient verbalized understanding and stated she will wait for the call.

## 2018-10-25 NOTE — Telephone Encounter (Signed)
Spoke with patient regarding her concerns with Prolia.  Discussed with her that we can stop Prolia but due to her bone health, we recommend starting fosamax.  Discussed with her the rebound effect prolia can have if it is stopped abruptly.  Patient is agreeable to trying fosamax, states she will start this in a few weeks after her back pain resolves.  She will let us know if she develops any side effects on fosamax.

## 2018-10-25 NOTE — Telephone Encounter (Signed)
Patient  Called    She went for her Prolia injection first week in January  Having lower back pain patient says its severe  Went to ER ---on 10-25-18 [facility]  They did Ct scan and labs  Tested for kidney stone and bladder stones all that was negative    Patient wants Dr Laurena Bering to know that this back pain is severe  Patient doesn't want to continue   With this injection--she wants to know what she can do differently  Patient is doubled over in pain     Patient is 100 percent sure that's its due to this injection

## 2018-10-31 NOTE — Telephone Encounter (Signed)
Dr. Bjorn Loser calling again on patient. He would like to speak with [name].  Please give him a call at [phone]

## 2018-10-31 NOTE — Telephone Encounter (Addendum)
10/31/2018 4:59 PM: [name] spoke to Dr. Bjorn Loser and advised that since Dr. Bjorn Loser is not listed on the patient's PHI form, unfortunately he cannot discuss the patient's information with him. Also advised that if the patient can give verbal permission, they can discuss patient information. Dr. Bjorn Loser stated that he will have the patient call back to give verbal permission.     11/01/2018 10:56 AM: [name] spoke to Dr. Bjorn Loser and addressed his questions and concerns.

## 2018-10-31 NOTE — Telephone Encounter (Signed)
Ms. Bjorn Loser called and gave verbal authorization for [name] and his staff to speak to Emily [name]. Emily [name] can have everything.    Updated in [place]

## 2018-11-01 ENCOUNTER — Encounter

## 2018-11-02 ENCOUNTER — Emergency Department: Admit: 2018-11-03 | Payer: MEDICARE | Primary: Family Medicine

## 2018-11-02 DIAGNOSIS — M25531 Pain in right wrist: Secondary | ICD-10-CM

## 2018-11-02 NOTE — ED Notes (Signed)
Pt just reported right shoulder pain.
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
## 2018-11-02 NOTE — ED Notes (Signed)
Pt at sitting at chair with son. Pt in no acute stress. Pt informed waiting on results.

## 2018-11-02 NOTE — ED Notes (Signed)
 Patient anxious to leave states she needs to get her son to bed. Patient standing in hallway ready to go, MD notified.     Paperwork provided to patient and reviewed in hallway. Refusing vitals signs at this time, pt in a hurry to leave.     The patient left the Emergency Department ambulatory, alert and oriented and in no acute distress. The patient was encouraged to call or return to the ED for worsening issues or problems and was encouraged to schedule a follow up appointment for continuing care.     The patient verbalized understanding of discharge instructions and prescriptions, all questions were answered. The patient has no further concerns at this time.
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
## 2018-11-02 NOTE — ED Notes (Signed)
Patient anxious to leave states she needs to get her son to bed. Patient standing in hallway ready to go, MD notified.     Paperwork provided to patient and reviewed in hallway. Refusing vitals signs at this time, pt in a hurry to leave.     The patient left the Emergency Department ambulatory, alert and oriented and in no acute distress. The patient was encouraged to call or return to the ED for worsening issues or problems and was encouraged to schedule a follow up appointment for continuing care.   ??  The patient verbalized understanding of discharge instructions and prescriptions, all questions were answered. The patient has no further concerns at this time.
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
## 2018-11-03 ENCOUNTER — Inpatient Hospital Stay
Admit: 2018-11-03 | Discharge: 2018-11-03 | Disposition: A | Discharge status: Home / Self Care | Payer: MEDICARE | Attending: Emergency Medicine

## 2018-11-03 MED ORDER — HYDROCODONE-ACETAMINOPHEN 5 MG-325 MG TAB
5-325 mg | ORAL_TABLET | ORAL | 0 refills | Status: AC | PRN
Start: 2018-11-03 — End: 2018-11-05

## 2018-11-03 MED ORDER — HYDROCODONE-ACETAMINOPHEN 5 MG-325 MG TAB
5-325 mg | ORAL | Status: AC
Start: 2018-11-03 — End: 2018-11-02
  Administered 2018-11-03: 02:00:00 via ORAL

## 2018-11-03 MED FILL — HYDROCODONE-ACETAMINOPHEN 5 MG-325 MG TAB: 5-325 mg | ORAL | Qty: 1

## 2018-11-06 ENCOUNTER — Inpatient Hospital Stay: Admit: 2018-11-06 | Payer: MEDICARE | Attending: Specialist | Primary: Family Medicine

## 2018-11-06 DIAGNOSIS — C50912 Malignant neoplasm of unspecified site of left female breast: Secondary | ICD-10-CM

## 2018-11-06 MED ORDER — TECHNETIUM TC 99M MEDRONATE IV KIT
Freq: Once | Status: AC
Start: 2018-11-06 — End: 2018-11-06
  Administered 2018-11-06: 17:00:00 via INTRAVENOUS

## 2018-11-07 NOTE — Telephone Encounter (Addendum)
I called and let Patient know that it was normal and she wanted to know when she should start the Fosamax? She stated that Sunny told her 02/13 but wanted to confirm that was correct?     ----- Message from Joyce Gross, MD sent at 11/06/2018  4:54 PM EST -----  Please let she know this is normal

## 2018-11-23 ENCOUNTER — Inpatient Hospital Stay
Admit: 2018-11-23 | Discharge: 2018-11-26 | Disposition: A | Discharge status: Home / Self Care | Payer: MEDICARE | Attending: Internal Medicine | Admitting: Internal Medicine

## 2018-11-23 ENCOUNTER — Inpatient Hospital Stay: Admit: 2018-11-23 | Payer: MEDICARE | Primary: Family Medicine

## 2018-11-23 ENCOUNTER — Emergency Department: Admit: 2018-11-23 | Payer: MEDICARE | Primary: Family Medicine

## 2018-11-23 DIAGNOSIS — L03114 Cellulitis of left upper limb: Secondary | ICD-10-CM

## 2018-11-23 LAB — CBC WITH AUTO DIFFERENTIAL
Basophils %: 0 % (ref 0–1)
Basophils Absolute: 0 10*3/uL (ref 0.0–0.1)
Eosinophils %: 0 % (ref 0–7)
Eosinophils Absolute: 0 10*3/uL (ref 0.0–0.4)
Granulocyte Absolute Count: 0.1 10*3/uL — ABNORMAL HIGH (ref 0.00–0.04)
Hematocrit: 42.9 % (ref 35.0–47.0)
Hemoglobin: 13.9 g/dL (ref 11.5–16.0)
Immature Granulocytes: 1 % — ABNORMAL HIGH (ref 0.0–0.5)
Lymphocytes %: 4 % — ABNORMAL LOW (ref 12–49)
Lymphocytes Absolute: 0.5 10*3/uL — ABNORMAL LOW (ref 0.8–3.5)
MCH: 29.9 PG (ref 26.0–34.0)
MCHC: 32.4 g/dL (ref 30.0–36.5)
MCV: 92.3 FL (ref 80.0–99.0)
MPV: 9.8 FL (ref 8.9–12.9)
Monocytes %: 4 % — ABNORMAL LOW (ref 5–13)
Monocytes Absolute: 0.5 10*3/uL (ref 0.0–1.0)
NRBC Absolute: 0 10*3/uL (ref 0.00–0.01)
Neutrophils %: 91 % — ABNORMAL HIGH (ref 32–75)
Neutrophils Absolute: 12.3 10*3/uL — ABNORMAL HIGH (ref 1.8–8.0)
Nucleated RBCs: 0 PER 100 WBC
Platelets: 213 10*3/uL (ref 150–400)
RBC: 4.65 M/uL (ref 3.80–5.20)
RDW: 13.5 % (ref 11.5–14.5)
WBC: 13.4 10*3/uL — ABNORMAL HIGH (ref 3.6–11.0)

## 2018-11-23 LAB — COMPREHENSIVE METABOLIC PANEL
ALT: 65 U/L (ref 12–78)
AST: 63 U/L — ABNORMAL HIGH (ref 15–37)
Albumin/Globulin Ratio: 1.4 (ref 1.1–2.2)
Albumin: 4.6 g/dL (ref 3.5–5.0)
Alkaline Phosphatase: 136 U/L — ABNORMAL HIGH (ref 45–117)
Anion Gap: 4 mmol/L — ABNORMAL LOW (ref 5–15)
BUN: 8 MG/DL (ref 6–20)
Bun/Cre Ratio: 18 (ref 12–20)
CO2: 29 mmol/L (ref 21–32)
Calcium: 8.5 MG/DL (ref 8.5–10.1)
Chloride: 100 mmol/L (ref 97–108)
Creatinine: 0.45 MG/DL — ABNORMAL LOW (ref 0.55–1.02)
EGFR IF NonAfrican American: >60 mL/min/{1.73_m2} (ref >60)
GFR African American: >60 mL/min/{1.73_m2} (ref >60)
Globulin: 3.4 g/dL (ref 2.0–4.0)
Glucose: 62 mg/dL — ABNORMAL LOW (ref 65–100)
Potassium: 3.3 mmol/L — ABNORMAL LOW (ref 3.5–5.1)
Sodium: 133 mmol/L — ABNORMAL LOW (ref 136–145)
Total Bilirubin: 0.5 MG/DL (ref 0.2–1.0)
Total Protein: 8 g/dL (ref 6.4–8.2)

## 2018-11-23 LAB — LACTIC ACID
Lactic Acid: 1.6 MMOL/L (ref 0.4–2.0)
Lactic acid: 1.6 MMOL/L (ref 0.4–2.0)

## 2018-11-23 LAB — CBC WITH AUTOMATED DIFF
ABS. BASOPHILS: 0 10*3/uL (ref 0.0–0.1)
ABS. EOSINOPHILS: 0 10*3/uL (ref 0.0–0.4)
ABS. IMM. GRANS.: 0.1 10*3/uL — ABNORMAL HIGH (ref 0.00–0.04)
ABS. LYMPHOCYTES: 0.5 10*3/uL — ABNORMAL LOW (ref 0.8–3.5)
ABS. MONOCYTES: 0.5 10*3/uL (ref 0.0–1.0)
ABS. NEUTROPHILS: 12.3 10*3/uL — ABNORMAL HIGH (ref 1.8–8.0)
ABSOLUTE NRBC: 0 10*3/uL (ref 0.00–0.01)
BASOPHILS: 0 % (ref 0–1)
EOSINOPHILS: 0 % (ref 0–7)
HCT: 42.9 % (ref 35.0–47.0)
HGB: 13.9 g/dL (ref 11.5–16.0)
IMMATURE GRANULOCYTES: 1 % — ABNORMAL HIGH (ref 0.0–0.5)
LYMPHOCYTES: 4 % — ABNORMAL LOW (ref 12–49)
MCH: 29.9 PG (ref 26.0–34.0)
MCHC: 32.4 g/dL (ref 30.0–36.5)
MCV: 92.3 FL (ref 80.0–99.0)
MONOCYTES: 4 % — ABNORMAL LOW (ref 5–13)
MPV: 9.8 FL (ref 8.9–12.9)
NEUTROPHILS: 91 % — ABNORMAL HIGH (ref 32–75)
NRBC: 0 PER 100 WBC
PLATELET: 213 10*3/uL (ref 150–400)
RBC: 4.65 M/uL (ref 3.80–5.20)
RDW: 13.5 % (ref 11.5–14.5)
WBC: 13.4 10*3/uL — ABNORMAL HIGH (ref 3.6–11.0)

## 2018-11-23 LAB — METABOLIC PANEL, COMPREHENSIVE
A-G Ratio: 1.4 (ref 1.1–2.2)
ALT (SGPT): 65 U/L (ref 12–78)
AST (SGOT): 63 U/L — ABNORMAL HIGH (ref 15–37)
Albumin: 4.6 g/dL (ref 3.5–5.0)
Alk. phosphatase: 136 U/L — ABNORMAL HIGH (ref 45–117)
Anion gap: 4 mmol/L — ABNORMAL LOW (ref 5–15)
BUN/Creatinine ratio: 18 (ref 12–20)
BUN: 8 MG/DL (ref 6–20)
Bilirubin, total: 0.5 MG/DL (ref 0.2–1.0)
CO2: 29 mmol/L (ref 21–32)
Calcium: 8.5 MG/DL (ref 8.5–10.1)
Chloride: 100 mmol/L (ref 97–108)
Creatinine: 0.45 MG/DL — ABNORMAL LOW (ref 0.55–1.02)
GFR est AA: >60 mL/min/{1.73_m2} (ref >60)
GFR est non-AA: >60 mL/min/{1.73_m2} (ref >60)
Globulin: 3.4 g/dL (ref 2.0–4.0)
Glucose: 62 mg/dL — ABNORMAL LOW (ref 65–100)
Potassium: 3.3 mmol/L — ABNORMAL LOW (ref 3.5–5.1)
Protein, total: 8 g/dL (ref 6.4–8.2)
Sodium: 133 mmol/L — ABNORMAL LOW (ref 136–145)

## 2018-11-23 LAB — SAMPLES BEING HELD

## 2018-11-23 MED ORDER — ADV ADDAPTOR
1 gram | Status: AC
Start: 2018-11-23 — End: 2018-11-23
  Administered 2018-11-23: 15:00:00 via INTRAVENOUS

## 2018-11-23 MED ORDER — SODIUM CHLORIDE 0.9 % IJ SYRG
INTRAMUSCULAR | Status: DC | PRN
Start: 2018-11-23 — End: 2018-11-26

## 2018-11-23 MED ORDER — ADV ADDAPTOR
750 mg | Status: DC
Start: 2018-11-23 — End: 2018-11-25
  Administered 2018-11-24 – 2018-11-25 (×3): via INTRAVENOUS

## 2018-11-23 MED ORDER — HEPARIN (PORCINE) 5,000 UNIT/ML IJ SOLN
5000 unit/mL | Freq: Three times a day (TID) | INTRAMUSCULAR | Status: DC
Start: 2018-11-23 — End: 2018-11-26
  Administered 2018-11-23 – 2018-11-24 (×3): via SUBCUTANEOUS

## 2018-11-23 MED ORDER — SODIUM CHLORIDE 0.9% BOLUS IV
0.9 % | Freq: Once | INTRAVENOUS | Status: AC
Start: 2018-11-23 — End: 2018-11-23
  Administered 2018-11-23: 14:00:00 via INTRAVENOUS

## 2018-11-23 MED ORDER — DULOXETINE 30 MG CAP, DELAYED RELEASE
30 mg | Freq: Two times a day (BID) | ORAL | Status: DC
Start: 2018-11-23 — End: 2018-11-25
  Administered 2018-11-23 – 2018-11-24 (×2): via ORAL

## 2018-11-23 MED ORDER — ZOLPIDEM 5 MG TAB
5 mg | Freq: Every evening | ORAL | Status: DC | PRN
Start: 2018-11-23 — End: 2018-11-26
  Administered 2018-11-24 – 2018-11-26 (×3): via ORAL

## 2018-11-23 MED ORDER — POTASSIUM CHLORIDE SR 10 MEQ TAB
10 mEq | ORAL | Status: AC
Start: 2018-11-23 — End: 2018-11-23
  Administered 2018-11-23 – 2018-11-24 (×2): via ORAL

## 2018-11-23 MED ORDER — ADV ADDAPTOR
3.375 gram | Freq: Three times a day (TID) | Status: DC
Start: 2018-11-23 — End: 2018-11-26
  Administered 2018-11-23 – 2018-11-26 (×9): via INTRAVENOUS

## 2018-11-23 MED ORDER — SODIUM CHLORIDE 0.9 % IV
INTRAVENOUS | Status: AC
Start: 2018-11-23 — End: 2018-11-24
  Administered 2018-11-23: 20:00:00 via INTRAVENOUS

## 2018-11-23 MED ORDER — SODIUM CHLORIDE 0.9 % IJ SYRG
Freq: Three times a day (TID) | INTRAMUSCULAR | Status: DC
Start: 2018-11-23 — End: 2018-11-26
  Administered 2018-11-23 – 2018-11-26 (×9): via INTRAVENOUS

## 2018-11-23 MED ORDER — MORPHINE 2 MG/ML INJECTION
2 mg/mL | INTRAMUSCULAR | Status: DC | PRN
Start: 2018-11-23 — End: 2018-11-26
  Administered 2018-11-23 – 2018-11-25 (×8): via INTRAVENOUS

## 2018-11-23 MED ORDER — BUTALBITAL-ACETAMINOPHEN-CAFFEINE 50 MG-325 MG-40 MG TAB
50-325-40 mg | Freq: Four times a day (QID) | ORAL | Status: DC | PRN
Start: 2018-11-23 — End: 2018-11-26

## 2018-11-23 MED ORDER — .PHARMACY TO SUBSTITUTE PER PROTOCOL
Status: DC | PRN
Start: 2018-11-23 — End: 2018-11-23

## 2018-11-23 MED ORDER — HYDROCODONE-ACETAMINOPHEN 5 MG-325 MG TAB
5-325 mg | ORAL | Status: DC | PRN
Start: 2018-11-23 — End: 2018-11-24
  Administered 2018-11-23 – 2018-11-24 (×3): via ORAL

## 2018-11-23 MED ORDER — SODIUM CHLORIDE 0.9 % IV PIGGY BACK
1000 mg | INTRAVENOUS | Status: AC
Start: 2018-11-23 — End: 2018-11-23
  Administered 2018-11-23: 16:00:00 via INTRAVENOUS

## 2018-11-23 MED ORDER — PHARMACY VANCOMYCIN NOTE
Status: DC
Start: 2018-11-23 — End: 2018-11-26

## 2018-11-23 MED FILL — HEPARIN (PORCINE) 5,000 UNIT/ML IJ SOLN: 5000 unit/mL | INTRAMUSCULAR | Qty: 1

## 2018-11-23 MED FILL — HYDROCODONE-ACETAMINOPHEN 5 MG-325 MG TAB: 5-325 mg | ORAL | Qty: 1

## 2018-11-23 MED FILL — SODIUM CHLORIDE 0.9 % IV: INTRAVENOUS | Qty: 1000

## 2018-11-23 MED FILL — DULOXETINE 30 MG CAP, DELAYED RELEASE: 30 mg | ORAL | Qty: 1

## 2018-11-23 MED FILL — CEFTRIAXONE 1 GRAM SOLUTION FOR INJECTION: 1 gram | INTRAMUSCULAR | Qty: 1

## 2018-11-23 MED FILL — MORPHINE 2 MG/ML INJECTION: 2 mg/mL | INTRAMUSCULAR | Qty: 1

## 2018-11-23 MED FILL — PHARMACY VANCOMYCIN NOTE: Qty: 1

## 2018-11-23 MED FILL — K-TAB 10 MEQ TABLET,EXTENDED RELEASE: 10 mEq | ORAL | Qty: 4

## 2018-11-23 MED FILL — VANCOMYCIN 1,000 MG IV SOLR: 1000 mg | INTRAVENOUS | Qty: 1000

## 2018-11-23 MED FILL — PIPERACILLIN-TAZOBACTAM 3.375 GRAM IV SOLR: 3.375 gram | INTRAVENOUS | Qty: 3.38

## 2018-11-23 NOTE — ED Provider Notes (Signed)
HPI .  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 ; patient has had breast cancer.  She had a left mastectomy and lymph node dissection.  She had a positive left axillary node.  Patient's son is 66 years old with special needs.  He has  been diagnosed with pandas syndrome.  They feel like the son gets a strep issue 3 or 4 times a year.  When her son develops his symptoms the patient takes prophylactic antibiotics trying to prevent getting a strep infection.  Patient has been on Augmentin 875 mg twice daily for 5 days because her son was feeling bad.  Several days ago patient started feeling like she was coming down with something.  Over the last 24 hours patient has developed left arm redness pain associated with weakness, body aches, mild headache, and pain over the bottom of her feet.    Past Medical History:   Diagnosis Date   ??? Anxiety    ??? Arthritis    ??? Basal cell carcinoma    ??? Cancer ([place])     left breast  cancer   ??? Chronic tension headaches    ??? Concussion 2011   ??? Headache(784.0)     OCCAS   ??? Hx of radiation therapy     COMPLETED IN 2014   ??? Ill-defined condition     cellulitis L UE   ??? Lymphedema of arm     left   ??? Nausea & vomiting    ??? Other and unspecified symptoms and signs involving general sensations and perceptions     HIVES RECENTLY- WAS IN ER Feb 19, 2013   ??? Other ill-defined conditions(799.89)     PHEOCHROMOCYTOMA   ??? S/P chemotherapy, time since greater than 12 weeks     COMPLETED CHEMOTHERAPY IN JUNE 2014   ??? SCCA (squamous cell carcinoma) of skin        Past Surgical History:   Procedure Laterality Date   ??? ABDOMEN SURGERY PROC UNLISTED      hernia mesh repair   ??? ABDOMEN SURGERY PROC UNLISTED      right adenal removed   ??? BREAST SURGERY PROCEDURE UNLISTED  '91    breast implants; replaced bilat implants 2011   ??? HX APPENDECTOMY     ??? HX BREAST AUGMENTATION Left 04/22/2014    REMOVAL OF LEFT BREAST IMPLANT, WASHOUT AND CLOSURE OF LEFT BREAST WOUND performed by  Sharlotte Alamo, MD at [facility]   ??? HX BREAST RECONSTRUCTION  11/28/2012    BREAST RECONSTRUCTION /C  INSERTION EXPANDER & ALLODERM performed by Sharlotte Alamo, MD at [facility]   ??? HX BREAST RECONSTRUCTION Bilateral 09/16/2013    REMOVAL AND REPLACEMENT LEFT BREAST IMPLANT TISSUE EXPANDERS WITH SUBTOTAL CAPSULECTOMY, RIGHT BREAST IMPLANT REMOVAL AND REPLACEMENT FOR SYMMETRY/FAT GRAFTING TO BILATERAL BREASTS performed by Sharlotte Alamo, MD at [facility]   ??? HX GYN      c-section   ??? HX HEENT      T&A   ??? HX HEENT      right adrenal removed   ??? HX MASTECTOMY Left    ??? HX MODIFIED RADICAL MASTECTOMY  11/28/2012    LEFT BREAST MODIFIED RADICAL MASTECTOMY WITH AXILLARY DISSECTION W/ RECONSTRUCTION LEFT BREAST WITH TISSUE EXPANDER AND ALLODERM;PORTACATH INSERTION performed by Ty Hilts, MD at [facility]   ??? HX OOPHORECTOMY      right   ??? HX ORTHOPAEDIC  bilateral shoulder arthroscopy   ??? HX VASCULAR ACCESS      PORT, THEN REMOVED         Family History:   Problem Relation Age of Onset   ??? Alzheimer Mother    ??? Diabetes Mother    ??? Other Mother         COMBATIVE POST ANESTHESIA   ??? Heart Disease Father    ??? Diabetes Father    ??? Lung Disease Father         COPD   ??? Headache Sister    ??? Migraines Sister    ??? Anesth Problems Neg Hx        Social History     Socioeconomic History   ??? Marital status: SINGLE     Spouse name: Not on file   ??? Number of children: Not on file   ??? Years of education: Not on file   ??? Highest education level: Not on file   Occupational History   ??? Not on file   Social Needs   ??? Financial resource strain: Not on file   ??? Food insecurity:     Worry: Not on file     Inability: Not on file   ??? Transportation needs:     Medical: Not on file     Non-medical: Not on file   Tobacco Use   ??? Smoking status: Never Smoker   ??? Smokeless tobacco: Never Used   Substance and Sexual Activity   ??? Alcohol use: No   ??? Drug use: No   ??? Sexual activity: Not on file   Lifestyle   ??? Physical  activity:     Days per week: Not on file     Minutes per session: Not on file   ??? Stress: Not on file   Relationships   ??? Social connections:     Talks on phone: Not on file     Gets together: Not on file     Attends religious service: Not on file     Active member of club or organization: Not on file     Attends meetings of clubs or organizations: Not on file     Relationship status: Not on file   ??? Intimate partner violence:     Fear of current or ex partner: Not on file     Emotionally abused: Not on file     Physically abused: Not on file     Forced sexual activity: Not on file   Other Topics Concern   ??? Not on file   Social History Narrative   ??? Not on file         ALLERGIES: Aspirin; Bacitracin; Compazine [prochlorperazine]; Keflex [cephalexin]; and Nsaids (non-steroidal anti-inflammatory drug)    Review of Systems   Constitutional: Positive for appetite change and chills.   HENT: Negative for congestion and sore throat.    Respiratory: Negative for cough.    Cardiovascular: Negative for chest pain.   Gastrointestinal: Negative for abdominal distention and abdominal pain.   Genitourinary: Negative for difficulty urinating.   Musculoskeletal: Positive for arthralgias and myalgias.   Neurological: Positive for speech difficulty and weakness.   Psychiatric/Behavioral: Negative for agitation and behavioral problems. The patient is not nervous/anxious.        Vitals:    11/23/18 0814 11/23/18 0843   BP: (!) 167/103 116/59   Pulse: (!) 104 100   Resp: 16 16   Temp: 97.5 ??F (36.4 ??C)  SpO2: 100% 100%            Physical Exam  Vitals signs and nursing note reviewed.   Constitutional:       Appearance: She is well-developed.   HENT:      Head: Normocephalic and atraumatic.   Eyes:      Pupils: Pupils are equal, round, and reactive to light.   Neck:      Musculoskeletal: Normal range of motion and neck supple.   Cardiovascular:      Rate and Rhythm: Normal rate and regular rhythm.      Heart sounds: Normal heart  sounds. No murmur. No friction rub. No gallop.    Pulmonary:      Effort: Pulmonary effort is normal. No respiratory distress.      Breath sounds: No wheezing or rales.   Abdominal:      Palpations: Abdomen is soft.      Tenderness: There is no abdominal tenderness. There is no rebound.   Musculoskeletal: Normal range of motion.         General: No tenderness.   Skin:     Findings: No erythema.      Comments: Tenderness and erythema of the left upper extremity   Neurological:      Mental Status: She is alert.      Cranial Nerves: No cranial nerve deficit.      Comments: Motor; symmetric   Psychiatric:         Behavior: Behavior normal.          MDM       Procedures        Note: When patient had a similar episode of cellulitis she was taking Vanco and Zosyn due to her history of cephalosporin allergy.  She was switched to vancomycin and Rocephin and showed rapid improvement.  We were able to pull up her 2016 [facility] admission from the care everywhere section of the electronic medical record.  I discussed this with the patient and she agrees that we should go with what worked before.  I removed the cephalosporin allergy from her allergy list and at present I am expecting her to tolerate the Rocephin well.   Misty Stanley, MD  9:30 AM         Hospitalist Tiger Text for Admission  9:58 AM    ED Room Number: ER24/24  Patient Name and age:  Emily [name] 68 y.o.  female  Working Diagnosis:   1. Cellulitis of left arm      Readmission: no  Isolation Requirements:  no  Recommended Level of Care:  med/surg  Code Status:  FULL  Other:    Department:[facility] Adult ED - [phone]

## 2018-11-23 NOTE — Progress Notes (Signed)
 Pharmacist Note - Vancomycin  Dosing    Consult provided for this 68 y.o. female for indication of skin and soft tissue infection.  Antibiotic regimen(s): vancomycin  monotherapy (1 dose of Rocephin in the ED)  Patient on vancomycin  PTA? NO     Recent Labs     11/23/18  0836   WBC 13.4*   CREA 0.45*   BUN 8     Frequency of BMP: daily  Height: 166 cm  Weight: 51.7 kg  Est CrCl: 65 ml/min; UO:  ml/kg/hr  Temp (24hrs), Avg:97.8 F (36.6 C), Min:97.5 F (36.4 C), Max:98 F (36.7 C)    Cultures: 2/21 blood cultures pending    Goal trough = 10 - 15 mcg/mL    Therapy was initiated with a loading dose of 1000 mg IV x 1. Will order maintenance dose of 750 mg every 18 hours. Pharmacy to follow patient daily and order levels / make dose adjustments as appropriate.

## 2018-11-23 NOTE — ED Notes (Signed)
Assumed care of patient from RN. Patient resting in bed, no signs of distress, will continue to monitor.

## 2018-11-23 NOTE — ACP (Advance Care Planning) (Signed)
Advance Care Planning (ACP) Provider Note - Comprehensive     Date of ACP Conversation: 11/23/18     Persons included in Conversation:  Patient, her friend, Nickola Major and Dr Manus Gunning    Patient Active Problem List   Diagnosis Code   ??? Breast cancer, stage 2 ([place]) C50.919   ??? 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 Lymphedema of left arm I89.0   ??? 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 Cellulitis of left arm L03.114     These active diagnoses are of sufficient risk that focused discussion on advance care planning is indicated in order to allow the patient to thoughtfully consider her personal goals of care and, if situations arise that prevent the ability to personally give input, to ensure appropriate representation of her; personal desires through documentation or informed surrogate decision makers.    Authorized Media planner (if patient is incapable of making informed decisions):   This person is: Friend, Dr Bjorn Loser, Nathaneil Canary      Discussions :  I reviewed her acute medical condition including left arm cellulitis and history for left breast cancer and her desire for ongoing aggressive care, including potential intubation and mechanical ventilation ; also discussed who would speak on her behalf should she be unable to do so and discussed what conversation as she has had with her family so they understand her desire if such a situation occurred now or in the future. She said she wants to be a full code and she said her friend, Dr Bjorn Loser, Nathaneil Canary is her medical decision in case of emergency.       Length of ACP Conversation in minutes:  16 minutes    ??  Tora Kindred, MD  11/23/2018

## 2018-11-23 NOTE — Progress Notes (Signed)
 Admission Medication Reconciliation:    Information obtained from:  Patient  RxQuery data available:  YES    Comments/Recommendations: Updated PTA meds/reviewed patient's allergies.    1)  Patient is a good historian. She reports that she is currently in pain and is requesting pain medication. Patient took two extra-strength Tylenols PTA today. Patient was recently prescribed alendronate for her osteoporosis, however says that she hasn't started taking it yet.    2)  Medication changes (since last review):  Added  - Oxycodone  5mg  1tab po q6h as needed  - Acetaminophen  500mg  1tab po as needed    Adjusted  - Butalbital-acetaminop-caf-cod 50-325-40-30mg  --> butalbital-acetaminophen -caff 50-300-40mg  1cap po as needed for headache  - Duloxetine  20mg  1cap po daily --> 30mg  1cap po twice daily    Removed  - Denosumab  60mg /mL injection SC  - Botox 200 unit injection     RxQuery pharmacy benefit data reflects medications filled and processed through the patient's insurance, however   this data does NOT capture whether the medication was picked up or is currently being taken by the patient.    Allergies:  Aspirin; Bacitracin; Compazine [prochlorperazine]; and Nsaids (non-steroidal anti-inflammatory drug)    Significant PMH/Disease States:   Past Medical History:   Diagnosis Date    Anxiety     Arthritis     Basal cell carcinoma     Cancer ([facility])     left breast  cancer    Chronic tension headaches     Concussion 2011    Headache(784.0)     OCCAS    Hx of radiation therapy     COMPLETED IN 2014    Ill-defined condition     cellulitis L UE    Lymphedema of arm     left    Nausea & vomiting     Other and unspecified symptoms and signs involving general sensations and perceptions     HIVES RECENTLY- WAS IN ER Feb 19, 2013    Other ill-defined conditions(799.89)     PHEOCHROMOCYTOMA    S/P chemotherapy, time since greater than 12 weeks     COMPLETED CHEMOTHERAPY IN JUNE 2014    SCCA (squamous cell carcinoma) of skin      Chief  Complaint for this Admission:    Chief Complaint   Patient presents with    Skin Infection     Prior to Admission Medications:   Prior to Admission Medications   Prescriptions Last Dose Informant Taking?   DULoxetine  (CYMBALTA ) 30 mg capsule 11/23/2018 at Unknown time  Yes   Sig: Take 30 mg by mouth two (2) times a day.   acetaminophen  (TYLENOL ) 500 mg tablet   Yes   Sig: Take 500 mg by mouth every six (6) hours as needed for Pain.   alendronate (FOSAMAX) 70 mg tablet Not Taking at Unknown time  No   Sig: Take 1 Tab by mouth every seven (7) days.   butalbitaL-acetaminop-caf-cod 50-300-40-30 mg cap   Yes   Sig: Take 1 Cap by mouth every six (6) hours as needed for Headache.   oxyCODONE  IR (ROXICODONE ) 5 mg immediate release tablet   Yes   Sig: Take 5 mg by mouth every six (6) hours as needed for Pain.   zolpidem  (AMBIEN ) 10 mg tablet   Yes   Sig: TAKE 1 TABLET BY MOUTH AT BEDTIME      Facility-Administered Medications: None       Please contact the main inpatient pharmacy with any questions or  concerns at [phone] and we will direct you to the clinical pharmacist covering this patient's care while in-house.   Francine GORMAN Feeling, PHARMD

## 2018-11-23 NOTE — ED Notes (Signed)
 Pt presents with skin infection to left arm. Pt states it began at 4am. Pt states I have a fever of 103 and need antibiotics now! pt visibly upset in waiting room discussing arm.

## 2018-11-23 NOTE — H&P (Signed)
H and P is dictated. ID [id number]

## 2018-11-23 NOTE — Progress Notes (Signed)
The following oral bisphosphonate has been discontinued upon admission per P&T/[facility] approved policy:      Fosamax 70mg  po weekly    Please reorder upon discharge if appropriate.

## 2018-11-23 NOTE — H&P (Signed)
[place]  HISTORY AND PHYSICAL    Name:  Emily [name], [name]  MR#:  [id number]  DOB:  11-17-1950  ACCOUNT #:  [id number]  ADMIT DATE:  11/23/2018      PRIMARY CARE PROVIDER:  Learta Codding III, MD    SOURCE OF INFORMATION:  Patient.    CHIEF COMPLAINT:  Left arm redness and pain since 04:00 a.m. this morning.    HISTORY OF PRESENT ILLNESS:  This is [name] with past medical history significant for left breast cancer status post lumpectomy and radiation treatment in 2014 and on chemotherapy, anxiety, depression, osteoporosis, fibromyalgia, rheumatoid arthritis and headache, who presented to [facility] Emergency Department with left arm redness, pain, and fever since 04:00 a.m. this morning.  She stated that she woke up at 04:00 a.m. with left arm redness which spread to her forearm and upper arm, associated with pain, 10/10, and fever, and decided to come to [facility] Emergency Department.  She stated that her son had strep and she completed Augmentin antibiotic for 5 days.  No nausea, vomiting, left-sided chest pain, palpitation, abdominal pain, urinary complaint, or lower extremity swelling.  She has dry cough and mild shortness of breath.  In the ER when she was seen, her vital signs, blood pressure was 167/103, pulse 104, temperature 97.5, saturation of oxygen 100% on room air.  Chest x-ray was done and no pneumonia.  She received IV ceftriaxone, vancomycin, and checked her lactic acid and her lactic acid was 1.6, and referred to hospitalist service for further evaluation and admission.    REVIEW OF SYSTEMS:  Pertinent positive findings mentioned in HPI.  All systems reviewed, no any other positive finding.    PAST MEDICAL HISTORY:  1.  Left breast cancer with neuroendocrine features, status post mastectomy and completed radiation treatment in 2014 and on chemotherapy.  2.  Anxiety.  3.  Depression.  4.  Osteoporosis.  5.  Fibromyalgia.  6.  Rheumatoid arthritis.  7.  Headache.    MEDICATIONS:  Prior to admission medications include,  1.  Denosumab 60 mg subcutaneous.  2.  Fosamax 70 mg every 7 days.  3.  Cymbalta 30 mg p.o. b.i.d.  4.  Ambien 10 mg at bedtime p.r.n.    ALLERGIES:  1.  ASPIRIN.  2.  BACITRACIN.  3.  COMPAZINE.  4.  NONSTEROIDAL ANTI-INFLAMMATORY DRUGS.    SOCIAL HISTORY:  She lives with her son and husband.  No tobacco or alcohol abuse.  Ambulates independently.    CODE STATUS:  Full code.    FAMILY HISTORY:  Mother had history of Alzheimer's dementia, diabetes.  Father had history of heart disease, COPD, congestive heart failure and diabetes.  Sister, history of headache, migraine.    PHYSICAL EXAMINATION:  VITAL SIGNS:  Blood pressure 133/90, pulse 88, temperature 98, respiratory rate 16, saturation of oxygen 100% on room air.  BMI is 18.68.  GENERAL APPEARANCE:  The patient is alert, cooperative, not in distress.  She appears her stated age.  HEENT:  Pink conjunctivae.  Anicteric sclerae.  Moist tongue and buccal mucosa.  LUNGS:  Clear to auscultation bilaterally.  CHEST WALL:  No tenderness or deformity.  CARDIOVASCULAR SYSTEM:  Regular rate and rhythm.  S1 and S2 normal.  No murmur or gallop.  ABDOMEN:  Soft, nontender.  Bowel sounds normal.  No mass or organomegaly.  EXTREMITIES:  Left arm, redness involving upper and forearm, warm to touch, tender.  No open wounds.  No lower extremity edema.  SKIN:  She has redness on the left upper arm with spreading to forearm and upper arm.  No other skin rash.  CENTRAL NERVOUS SYSTEM:  Conscious, well oriented to time, place and person.  Motor /5.  Sensation intact.  Cranial nerves II through XII grossly intact.    LABORATORY DATA:  White blood cell count 13.4, hemoglobin 13.9, hematocrit 42.9, platelet count 213.  Chemistry, sodium 133, potassium 3.3, chloride 100, CO2 of 29, anion gap 4, glucose 62, BUN 8, creatinine 0.45, BUN-creatinine ratio 18, calcium 8.5.  Total bilirubin 0.5, ALT 65, AST 63, alkaline  phosphatase 136.  Lactic acid 1.6.    Chest x-ray, no pneumonia.  There appears to be calcified nodules in the left hilar region and AP window region and possibly calcified granuloma in the left upper lobe, and suggested contrast CT.    ASSESSMENT:  1.  Left arm cellulitis.  2.  Hypokalemia.  3.  Hyponatremia.  4.  Left breast cancer with neuroendocrine features, stage IIA.  5.  Anxiety.  6.  History of fibromyalgia.  7.  History of rheumatoid arthritis.  8.  History of headaches.    PLAN:  1.  Left arm cellulitis.  Admit the patient to medical floor.  will do blood cx, start her on IV Zosyn and vancomycin and IV fluids Pain treatment p.r.n.  Mark the area.  Elevate the left arm, and if it does not respond to the IV antibiotic treatment, will involve Infectious Disease.  2.  Hypokalemia.  Replace with KCl and repeat potassium level in a.m.  3.  Hyponatremia, multifactorial.  Repeat BMP in a.m.  4.  Left breast cancer with neuroendocrine features, stage IIA, status post mastectomy in 2014 and completed radiation treatment in 2014, and on chemotherapy.  Chest x-ray showed some calcified nodules in the left hilar region, and suggested noncontrast CT, so we will do CT scan.  5.  Anxiety and depression.   will continue home medication, Cymbalta 30 mg p.o. b.i.d.  6.  History of fibromyalgia, stable.    will give her p.r.n. Tylenol.  7.  History of headaches, stable.    will give her p.r.n. Fioricet.  8.  History of rheumatoid arthritis, stable.    DVT prophylaxis.  Heparin.    FUNCTIONAL STATUS PRIOR TO ADMISSION:  The patient ambulated independently.        Tora Kindred, MD      [id number]  D:  11/23/2018 11:02  T:  11/23/2018 12:09  JOB #:  [id number]

## 2018-11-23 NOTE — ED Triage Notes (Signed)
Pt presents with skin infection to left arm. Pt states it began at 4am. Pt states "I have a fever of 103 and need antibiotics now!" pt visibly upset in waiting room discussing arm.

## 2018-11-23 NOTE — H&P (Signed)
[place]  HISTORY AND PHYSICAL    Name:  JARETSSI, KRAKER  MR#:  [id number]  DOB:  09-05-1951  ACCOUNT #:  [id number]  ADMIT DATE:  11/23/2018      PRIMARY CARE PROVIDER:  Learta Codding III, MD    SOURCE OF INFORMATION:  Patient.    CHIEF COMPLAINT:  Left arm redness and pain since 04:00 a.m. this morning.    HISTORY OF PRESENT ILLNESS:  This is [name] with past medical history significant for left breast cancer status post lumpectomy and radiation treatment in 2014 and on chemotherapy, anxiety, depression, osteoporosis, fibromyalgia, rheumatoid arthritis and headache, who presented to [facility] Emergency Department with left arm redness, pain, and fever since 04:00 a.m. this morning.  She stated that she woke up at 04:00 a.m. with left arm redness which spread to her forearm and upper arm, associated with pain, 10/10, and fever, and decided to come to [facility] Emergency Department.  She stated that her son had strep and she completed Augmentin antibiotic for 5 days.  No nausea, vomiting, left-sided chest pain, palpitation, abdominal pain, urinary complaint, or lower extremity swelling.  She has dry cough and mild shortness of breath.  In the ER when she was seen, her vital signs, blood pressure was 167/103, pulse 104, temperature 97.5, saturation of oxygen 100% on room air.  Chest x-ray was done and no pneumonia.  She received IV ceftriaxone, vancomycin, and checked her lactic acid and her lactic acid was 1.6, and referred to hospitalist service for further evaluation and admission.    REVIEW OF SYSTEMS:  Pertinent positive findings mentioned in HPI.  All systems reviewed, no any other positive finding.    PAST MEDICAL HISTORY:  1.  Left breast cancer with neuroendocrine features, status post mastectomy and completed radiation treatment in 2014 and on chemotherapy.  2.  Anxiety.  3.  Depression.  4.  Osteoporosis.  5.  Fibromyalgia.  6.  Rheumatoid arthritis.   7. Headache.    MEDICATIONS:  Prior to admission medications include,  1.  Denosumab 60 mg subcutaneous.  2.  Fosamax 70 mg every 7 days.  3.  Cymbalta 30 mg p.o. b.i.d.  4.  Ambien 10 mg at bedtime p.r.n.    ALLERGIES:  1.  ASPIRIN.  2.  BACITRACIN.  3.  COMPAZINE.  4.  NONSTEROIDAL ANTI-INFLAMMATORY DRUGS.    SOCIAL HISTORY:  She lives with her son and husband.  No tobacco or alcohol abuse.  Ambulates independently.    CODE STATUS:  Full code.    FAMILY HISTORY:  Mother had history of Alzheimer's dementia, diabetes.  Father had history of heart disease, COPD, congestive heart failure and diabetes.  Sister, history of headache, migraine.    PHYSICAL EXAMINATION:  VITAL SIGNS:  Blood pressure 133/90, pulse 88, temperature 98, respiratory rate 16, saturation of oxygen 100% on room air.  BMI is 18.68.  GENERAL APPEARANCE:  The patient is alert, cooperative, not in distress.  She appears her stated age.  HEENT:  Pink conjunctivae.  Anicteric sclerae.  Moist tongue and buccal mucosa.  LUNGS:  Clear to auscultation bilaterally.  CHEST WALL:  No tenderness or deformity.  CARDIOVASCULAR SYSTEM:  Regular rate and rhythm.  S1 and S2 normal.  No murmur or gallop.  ABDOMEN:  Soft, nontender.  Bowel sounds normal.  No mass or organomegaly.  EXTREMITIES:  Left arm, redness involving upper and forearm, warm to touch, tender.  No open wounds.  No lower extremity edema.  SKIN:  She has redness on the left upper arm with spreading to forearm and upper arm.  No other skin rash.  CENTRAL NERVOUS SYSTEM:  Conscious, well oriented to time, place and person.  Motor /5.  Sensation intact.  Cranial nerves II through XII grossly intact.    LABORATORY DATA:  White blood cell count 13.4, hemoglobin 13.9, hematocrit 42.9, platelet count 213.  Chemistry, sodium 133, potassium 3.3, chloride 100, CO2 of 29, anion gap 4, glucose 62, BUN 8, creatinine 0.45, BUN-creatinine ratio 18, calcium 8.5.  Total bilirubin 0.5, ALT 65, AST  63, alkaline phosphatase 136.  Lactic acid 1.6.    Chest x-ray, no pneumonia.  There appears to be calcified nodules in the left hilar region and AP window region and possibly calcified granuloma in the left upper lobe, and suggested contrast CT.    ASSESSMENT:  1.  Left arm cellulitis.  2.  Hypokalemia.  3.  Hyponatremia.  4.  Left breast cancer with neuroendocrine features, stage IIA.  5.  Anxiety.  6.  History of fibromyalgia.  7.  History of rheumatoid arthritis.  8.  History of headaches.    PLAN:  1.  Left arm cellulitis.  Admit the patient to medical floor.  will do blood cx, start her on IV Zosyn and vancomycin and IV fluids Pain treatment p.r.n.  Mark the area.  Elevate the left arm, and if it does not respond to the IV antibiotic treatment, will involve Infectious Disease.  2.  Hypokalemia.  Replace with KCl and repeat potassium level in a.m.  3.  Hyponatremia, multifactorial.  Repeat BMP in a.m.  4.  Left breast cancer with neuroendocrine features, stage IIA, status post mastectomy in 2014 and completed radiation treatment in 2014, and on chemotherapy.  Chest x-ray showed some calcified nodules in the left hilar region, and suggested noncontrast CT, so we will do CT scan.  5.  Anxiety and depression.   will continue home medication, Cymbalta 30 mg p.o. b.i.d.  6.  History of fibromyalgia, stable.    will give her p.r.n. Tylenol.  7.  History of headaches, stable.    will give her p.r.n. Fioricet.  8.  History of rheumatoid arthritis, stable.    DVT prophylaxis.  Heparin.    FUNCTIONAL STATUS PRIOR TO ADMISSION:  The patient ambulated independently.        Tora Kindred, MD      [id number]  D:  11/23/2018 11:02  T:  11/23/2018 12:09  JOB #:  [id number]

## 2018-11-23 NOTE — Other (Signed)
TRANSFER - OUT REPORT:    Verbal report given to Darrington RN(name) on Marnee Spring  being transferred to 5e(unit) for routine progression of care       Report consisted of patient???s Situation, Background, Assessment and   Recommendations(SBAR).     Information from the following report(s) SBAR, Kardex, ED Summary, [facility] and Recent Results was reviewed with the receiving nurse.    Lines:   Peripheral IV 11/23/18 Right Antecubital (Active)   Site Assessment Clean, dry, & intact 11/23/2018  8:49 AM   Phlebitis Assessment 0 11/23/2018  8:49 AM   Infiltration Assessment 0 11/23/2018  8:49 AM   Dressing Status Clean, dry, & intact 11/23/2018  8:49 AM        Opportunity for questions and clarification was provided.      Patient transported with:   [employer]

## 2018-11-23 NOTE — Progress Notes (Signed)
Pharmacist Note - Vancomycin Dosing    Consult provided for this 68 y.o. female for indication of skin and soft tissue infection.  Antibiotic regimen(s): vancomycin monotherapy (1 dose of Rocephin in the ED)  Patient on vancomycin PTA? NO     Recent Labs     11/23/18  0836   WBC 13.4*   CREA 0.45*   BUN 8     Frequency of BMP: daily  Height: 166 cm  Weight: 51.7 kg  Est CrCl: 65 ml/min; UO:  ml/kg/hr  Temp (24hrs), Avg:97.8 ??F (36.6 ??C), Min:97.5 ??F (36.4 ??C), Max:98 ??F (36.7 ??C)    Cultures: 2/21 blood cultures pending    Goal trough = 10 - 15 mcg/mL    Therapy was initiated with a loading dose of 1000 mg IV x 1. Will order maintenance dose of 750 mg every 18 hours. Pharmacy to follow patient daily and order levels / make dose adjustments as appropriate.

## 2018-11-23 NOTE — ED Provider Notes (Signed)
HPI .  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 ; patient has had breast cancer.  She had a left mastectomy and lymph node dissection.  She had a positive left axillary node.  Patient's son is 66 years old with special needs.  He has  been diagnosed with pandas syndrome.  They feel like the son gets a strep issue 3 or 4 times a year.  When her son develops his symptoms the patient takes prophylactic antibiotics trying to prevent getting a strep infection.  Patient has been on Augmentin 875 mg twice daily for 5 days because her son was feeling bad.  Several days ago patient started feeling like she was coming down with something.  Over the last 24 hours patient has developed left arm redness pain associated with weakness, body aches, mild headache, and pain over the bottom of her feet.    Past Medical History:   Diagnosis Date   ??? Anxiety    ??? Arthritis    ??? Basal cell carcinoma    ??? Cancer ([place])     left breast  cancer   ??? Chronic tension headaches    ??? Concussion 2011   ??? Headache(784.0)     OCCAS   ??? Hx of radiation therapy     COMPLETED IN 2014   ??? Ill-defined condition     cellulitis L UE   ??? Lymphedema of arm     left   ??? Nausea & vomiting    ??? Other and unspecified symptoms and signs involving general sensations and perceptions     HIVES RECENTLY- WAS IN ER Feb 19, 2013   ??? Other ill-defined conditions(799.89)     PHEOCHROMOCYTOMA   ??? S/P chemotherapy, time since greater than 12 weeks     COMPLETED CHEMOTHERAPY IN JUNE 2014   ??? SCCA (squamous cell carcinoma) of skin        Past Surgical History:   Procedure Laterality Date   ??? ABDOMEN SURGERY PROC UNLISTED      hernia mesh repair   ??? ABDOMEN SURGERY PROC UNLISTED      right adenal removed   ??? BREAST SURGERY PROCEDURE UNLISTED  '91    breast implants; replaced bilat implants 2011   ??? HX APPENDECTOMY     ??? HX BREAST AUGMENTATION Left 04/22/2014    REMOVAL OF LEFT BREAST IMPLANT, WASHOUT AND CLOSURE OF LEFT BREAST WOUND  performed by Sharlotte Alamo, MD at [place]   ??? HX BREAST RECONSTRUCTION  11/28/2012    BREAST RECONSTRUCTION /C  INSERTION EXPANDER & ALLODERM performed by Sharlotte Alamo, MD at [place]   ??? HX BREAST RECONSTRUCTION Bilateral 09/16/2013    REMOVAL AND REPLACEMENT LEFT BREAST IMPLANT TISSUE EXPANDERS WITH SUBTOTAL CAPSULECTOMY, RIGHT BREAST IMPLANT REMOVAL AND REPLACEMENT FOR SYMMETRY/FAT GRAFTING TO BILATERAL BREASTS performed by Sharlotte Alamo, MD at [place]   ??? HX GYN      c-section   ??? HX HEENT      T&A   ??? HX HEENT      right adrenal removed   ??? HX MASTECTOMY Left    ??? HX MODIFIED RADICAL MASTECTOMY  11/28/2012    LEFT BREAST MODIFIED RADICAL MASTECTOMY WITH AXILLARY DISSECTION W/ RECONSTRUCTION LEFT BREAST WITH TISSUE EXPANDER AND ALLODERM;PORTACATH INSERTION performed by Ty Hilts, MD at [place]   ??? HX OOPHORECTOMY      right   ??? HX ORTHOPAEDIC  bilateral shoulder arthroscopy   ??? HX VASCULAR ACCESS      PORT, THEN REMOVED         Family History:   Problem Relation Age of Onset   ??? Alzheimer Mother    ??? Diabetes Mother    ??? Other Mother         COMBATIVE POST ANESTHESIA   ??? Heart Disease Father    ??? Diabetes Father    ??? Lung Disease Father         COPD   ??? Headache Sister    ??? Migraines Sister    ??? Anesth Problems Neg Hx        Social History     Socioeconomic History   ??? Marital status: SINGLE     Spouse name: Not on file   ??? Number of children: Not on file   ??? Years of education: Not on file   ??? Highest education level: Not on file   Occupational History   ??? Not on file   Social Needs   ??? Financial resource strain: Not on file   ??? Food insecurity:     Worry: Not on file     Inability: Not on file   ??? Transportation needs:     Medical: Not on file     Non-medical: Not on file   Tobacco Use   ??? Smoking status: Never Smoker   ??? Smokeless tobacco: Never Used   Substance and Sexual Activity   ??? Alcohol use: No   ??? Drug use: No   ??? Sexual activity: Not on file   Lifestyle    ??? Physical activity:     Days per week: Not on file     Minutes per session: Not on file   ??? Stress: Not on file   Relationships   ??? Social connections:     Talks on phone: Not on file     Gets together: Not on file     Attends religious service: Not on file     Active member of club or organization: Not on file     Attends meetings of clubs or organizations: Not on file     Relationship status: Not on file   ??? Intimate partner violence:     Fear of current or ex partner: Not on file     Emotionally abused: Not on file     Physically abused: Not on file     Forced sexual activity: Not on file   Other Topics Concern   ??? Not on file   Social History Narrative   ??? Not on file         ALLERGIES: Aspirin; Bacitracin; Compazine [prochlorperazine]; Keflex [cephalexin]; and Nsaids (non-steroidal anti-inflammatory drug)    Review of Systems   Constitutional: Positive for appetite change and chills.   HENT: Negative for congestion and sore throat.    Respiratory: Negative for cough.    Cardiovascular: Negative for chest pain.   Gastrointestinal: Negative for abdominal distention and abdominal pain.   Genitourinary: Negative for difficulty urinating.   Musculoskeletal: Positive for arthralgias and myalgias.   Neurological: Positive for speech difficulty and weakness.   Psychiatric/Behavioral: Negative for agitation and behavioral problems. The patient is not nervous/anxious.        Vitals:    11/23/18 0814 11/23/18 0843   BP: (!) 167/103 116/59   Pulse: (!) 104 100   Resp: 16 16   Temp: 97.5 ??F (36.4 ??C)  SpO2: 100% 100%            Physical Exam  Vitals signs and nursing note reviewed.   Constitutional:       Appearance: She is well-developed.   HENT:      Head: Normocephalic and atraumatic.   Eyes:      Pupils: Pupils are equal, round, and reactive to light.   Neck:      Musculoskeletal: Normal range of motion and neck supple.   Cardiovascular:      Rate and Rhythm: Normal rate and regular rhythm.       Heart sounds: Normal heart sounds. No murmur. No friction rub. No gallop.    Pulmonary:      Effort: Pulmonary effort is normal. No respiratory distress.      Breath sounds: No wheezing or rales.   Abdominal:      Palpations: Abdomen is soft.      Tenderness: There is no abdominal tenderness. There is no rebound.   Musculoskeletal: Normal range of motion.         General: No tenderness.   Skin:     Findings: No erythema.      Comments: Tenderness and erythema of the left upper extremity   Neurological:      Mental Status: She is alert.      Cranial Nerves: No cranial nerve deficit.      Comments: Motor; symmetric   Psychiatric:         Behavior: Behavior normal.          MDM       Procedures        Note: When patient had a similar episode of cellulitis she was taking Vanco and Zosyn due to her history of cephalosporin allergy.  She was switched to vancomycin and Rocephin and showed rapid improvement.  We were able to pull up her 2016 [facility] admission from the care everywhere section of the electronic medical record.  I discussed this with the patient and she agrees that we should go with what worked before.  I removed the cephalosporin allergy from her allergy list and at present I am expecting her to tolerate the Rocephin well.   Misty Stanley, MD  9:30 AM         Hospitalist Tiger Text for Admission  9:58 AM    ED Room Number: ER24/24  Patient Name and age:  Emily [name] 68 y.o.  female  Working Diagnosis:   1. Cellulitis of left arm      Readmission: no  Isolation Requirements:  no  Recommended Level of Care:  med/surg  Code Status:  FULL  Other:    Department:[facility] Adult ED - [phone]

## 2018-11-23 NOTE — Progress Notes (Signed)
Admission Medication Reconciliation:    Information obtained from:  Patient  RxQuery data available??:  YES    Comments/Recommendations: Updated PTA meds/reviewed patient's allergies.    1)  Patient is a good historian. She reports that she is currently in pain and is requesting pain medication. Patient took two extra-strength Tylenols PTA today. Patient was recently prescribed alendronate for her osteoporosis, however says that she hasn't started taking it yet.    2)  Medication changes (since last review):  Added  - Oxycodone 5mg  1tab po q6h as needed  - Acetaminophen 500mg  1tab po as needed    Adjusted  - Butalbital-acetaminop-caf-cod 50-325-40-30mg  --> butalbital-acetaminophen-caff 50-300-40mg  1cap po as needed for headache  - Duloxetine 20mg  1cap po daily --> 30mg  1cap po twice daily    Removed  - Denosumab 60mg /mL injection SC  - Botox 200 unit injection     ??RxQuery pharmacy benefit data reflects medications filled and processed through the patient's insurance, however   this data does NOT capture whether the medication was picked up or is currently being taken by the patient.    Allergies:  Aspirin; Bacitracin; Compazine [prochlorperazine]; and Nsaids (non-steroidal anti-inflammatory drug)    Significant PMH/Disease States:   Past Medical History:   Diagnosis Date    Anxiety     Arthritis     Basal cell carcinoma     Cancer ([place])     left breast  cancer    Chronic tension headaches     Concussion 2011    Headache(784.0)     OCCAS    Hx of radiation therapy     COMPLETED IN 2014    Ill-defined condition     cellulitis L UE    Lymphedema of arm     left    Nausea & vomiting     Other and unspecified symptoms and signs involving general sensations and perceptions     HIVES RECENTLY- WAS IN ER Feb 19, 2013    Other ill-defined conditions(799.89)     PHEOCHROMOCYTOMA    S/P chemotherapy, time since greater than 12 weeks     COMPLETED CHEMOTHERAPY IN JUNE 2014    SCCA (squamous cell carcinoma) of skin       Chief Complaint for this Admission:    Chief Complaint   Patient presents with    Skin Infection     Prior to Admission Medications:   Prior to Admission Medications   Prescriptions Last Dose Informant Taking?   DULoxetine (CYMBALTA) 30 mg capsule 11/23/2018 at Unknown time  Yes   Sig: Take 30 mg by mouth two (2) times a day.   acetaminophen (TYLENOL) 500 mg tablet   Yes   Sig: Take 500 mg by mouth every six (6) hours as needed for Pain.   alendronate (FOSAMAX) 70 mg tablet Not Taking at Unknown time  No   Sig: Take 1 Tab by mouth every seven (7) days.   butalbitaL-acetaminop-caf-cod 50-300-40-30 mg cap   Yes   Sig: Take 1 Cap by mouth every six (6) hours as needed for Headache.   oxyCODONE IR (ROXICODONE) 5 mg immediate release tablet   Yes   Sig: Take 5 mg by mouth every six (6) hours as needed for Pain.   zolpidem (AMBIEN) 10 mg tablet   Yes   Sig: TAKE 1 TABLET BY MOUTH AT BEDTIME      Facility-Administered Medications: None       Please contact the main inpatient pharmacy with any questions or  concerns at [phone] and we will direct you to the clinical pharmacist covering this patient's care while in-house.   Mardee Postin, PHARMD

## 2018-11-23 NOTE — ACP (Advance Care Planning) (Signed)
Advance Care Planning (ACP) Provider Note - Comprehensive     Date of ACP Conversation: 11/23/18     Persons included in Conversation:  Patient, her friend, Nickola Major and Dr Manus Gunning    Patient Active Problem List   Diagnosis Code   ??? Breast cancer, stage 2 ([place]) C50.919   ??? Malignant neoplasm of lower-inner quadrant of left female breast ([place]) C50.312   ??? Lymphedema of left arm I89.0   ??? 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 Cellulitis of left arm L03.114     These active diagnoses are of sufficient risk that focused discussion on advance care planning is indicated in order to allow the patient to thoughtfully consider her personal goals of care and, if situations arise that prevent the ability to personally give input, to ensure appropriate representation of her; personal desires through documentation or informed surrogate decision makers.    Authorized Media planner (if patient is incapable of making informed decisions):   This person is: Friend, Dr Bjorn Loser, Nathaneil Canary      Discussions :  I reviewed her acute medical condition including left arm cellulitis and history for left breast cancer and her desire for ongoing aggressive care, including potential intubation and mechanical ventilation ; also discussed who would speak on her behalf should she be unable to do so and discussed what conversation as she has had with her family so they understand her desire if such a situation occurred now or in the future. She said she wants to be a full code and she said her friend, Dr Bjorn Loser, Nathaneil Canary is her medical decision in case of emergency.       Length of ACP Conversation in minutes:  16 minutes    ??  Emily Kindred, MD  11/23/2018

## 2018-11-23 NOTE — H&P (Signed)
H and P is dictated. ID [id number]

## 2018-11-23 NOTE — ED Notes (Signed)
Assumed care of patient from RN. Patient resting in bed, no signs of distress, will continue to monitor.

## 2018-11-24 ENCOUNTER — Inpatient Hospital Stay: Admit: 2018-11-24 | Payer: MEDICARE | Primary: Family Medicine

## 2018-11-24 LAB — COMPREHENSIVE METABOLIC PANEL
ALT: 155 U/L — ABNORMAL HIGH (ref 12–78)
AST: 114 U/L — ABNORMAL HIGH (ref 15–37)
Albumin/Globulin Ratio: 1.1 (ref 1.1–2.2)
Albumin: 3 g/dL — ABNORMAL LOW (ref 3.5–5.0)
Alkaline Phosphatase: 94 U/L (ref 45–117)
Anion Gap: 4 mmol/L — ABNORMAL LOW (ref 5–15)
BUN: 6 MG/DL (ref 6–20)
Bun/Cre Ratio: 16 (ref 12–20)
CO2: 23 mmol/L (ref 21–32)
Calcium: 7.3 MG/DL — ABNORMAL LOW (ref 8.5–10.1)
Chloride: 110 mmol/L — ABNORMAL HIGH (ref 97–108)
Creatinine: 0.38 MG/DL — ABNORMAL LOW (ref 0.55–1.02)
EGFR IF NonAfrican American: >60 mL/min/{1.73_m2} (ref >60)
GFR African American: >60 mL/min/{1.73_m2} (ref >60)
Globulin: 2.7 g/dL (ref 2.0–4.0)
Glucose: 95 mg/dL (ref 65–100)
Potassium: 4.1 mmol/L (ref 3.5–5.1)
Sodium: 137 mmol/L (ref 136–145)
Total Bilirubin: 0.4 MG/DL (ref 0.2–1.0)
Total Protein: 5.7 g/dL — ABNORMAL LOW (ref 6.4–8.2)

## 2018-11-24 LAB — CBC
Hematocrit: 34 % — ABNORMAL LOW (ref 35.0–47.0)
Hemoglobin: 11 g/dL — ABNORMAL LOW (ref 11.5–16.0)
MCH: 29.7 PG (ref 26.0–34.0)
MCHC: 32.4 g/dL (ref 30.0–36.5)
MCV: 91.9 FL (ref 80.0–99.0)
MPV: 9.8 FL (ref 8.9–12.9)
NRBC Absolute: 0 10*3/uL (ref 0.00–0.01)
Nucleated RBCs: 0 PER 100 WBC
Platelets: 172 10*3/uL (ref 150–400)
RBC: 3.7 M/uL — ABNORMAL LOW (ref 3.80–5.20)
RDW: 14 % (ref 11.5–14.5)
WBC: 10.4 10*3/uL (ref 3.6–11.0)

## 2018-11-24 LAB — METABOLIC PANEL, COMPREHENSIVE
A-G Ratio: 1.1 (ref 1.1–2.2)
ALT (SGPT): 155 U/L — ABNORMAL HIGH (ref 12–78)
AST (SGOT): 114 U/L — ABNORMAL HIGH (ref 15–37)
Albumin: 3 g/dL — ABNORMAL LOW (ref 3.5–5.0)
Alk. phosphatase: 94 U/L (ref 45–117)
Anion gap: 4 mmol/L — ABNORMAL LOW (ref 5–15)
BUN/Creatinine ratio: 16 (ref 12–20)
BUN: 6 MG/DL (ref 6–20)
Bilirubin, total: 0.4 MG/DL (ref 0.2–1.0)
CO2: 23 mmol/L (ref 21–32)
Calcium: 7.3 MG/DL — ABNORMAL LOW (ref 8.5–10.1)
Chloride: 110 mmol/L — ABNORMAL HIGH (ref 97–108)
Creatinine: 0.38 MG/DL — ABNORMAL LOW (ref 0.55–1.02)
GFR est AA: >60 mL/min/{1.73_m2} (ref >60)
GFR est non-AA: >60 mL/min/{1.73_m2} (ref >60)
Globulin: 2.7 g/dL (ref 2.0–4.0)
Glucose: 95 mg/dL (ref 65–100)
Potassium: 4.1 mmol/L (ref 3.5–5.1)
Protein, total: 5.7 g/dL — ABNORMAL LOW (ref 6.4–8.2)
Sodium: 137 mmol/L (ref 136–145)

## 2018-11-24 LAB — CBC W/O DIFF
ABSOLUTE NRBC: 0 10*3/uL (ref 0.00–0.01)
HCT: 34 % — ABNORMAL LOW (ref 35.0–47.0)
HGB: 11 g/dL — ABNORMAL LOW (ref 11.5–16.0)
MCH: 29.7 PG (ref 26.0–34.0)
MCHC: 32.4 g/dL (ref 30.0–36.5)
MCV: 91.9 FL (ref 80.0–99.0)
MPV: 9.8 FL (ref 8.9–12.9)
NRBC: 0 PER 100 WBC
PLATELET: 172 10*3/uL (ref 150–400)
RBC: 3.7 M/uL — ABNORMAL LOW (ref 3.80–5.20)
RDW: 14 % (ref 11.5–14.5)
WBC: 10.4 10*3/uL (ref 3.6–11.0)

## 2018-11-24 MED ORDER — DOCUSATE SODIUM 100 MG CAP
100 mg | Freq: Every day | ORAL | Status: DC
Start: 2018-11-24 — End: 2018-11-24

## 2018-11-24 MED ORDER — HYDROCODONE-ACETAMINOPHEN 5 MG-325 MG TAB
5-325 mg | ORAL | Status: DC | PRN
Start: 2018-11-24 — End: 2018-11-26
  Administered 2018-11-24 – 2018-11-26 (×7): via ORAL

## 2018-11-24 MED ORDER — DOCUSATE SODIUM 100 MG CAP
100 mg | Freq: Every day | ORAL | Status: DC
Start: 2018-11-24 — End: 2018-11-26
  Administered 2018-11-24 – 2018-11-26 (×3): via ORAL

## 2018-11-24 MED FILL — NORMAL SALINE FLUSH 0.9 % INJECTION SYRINGE: INTRAMUSCULAR | Qty: 10

## 2018-11-24 MED FILL — MORPHINE 2 MG/ML INJECTION: 2 mg/mL | INTRAMUSCULAR | Qty: 1

## 2018-11-24 MED FILL — HYDROCODONE-ACETAMINOPHEN 5 MG-325 MG TAB: 5-325 mg | ORAL | Qty: 1

## 2018-11-24 MED FILL — DOK 100 MG CAPSULE: 100 mg | ORAL | Qty: 2

## 2018-11-24 MED FILL — DULOXETINE 30 MG CAP, DELAYED RELEASE: 30 mg | ORAL | Qty: 1

## 2018-11-24 MED FILL — HEPARIN (PORCINE) 5,000 UNIT/ML IJ SOLN: 5000 unit/mL | INTRAMUSCULAR | Qty: 1

## 2018-11-24 MED FILL — PIPERACILLIN-TAZOBACTAM 3.375 GRAM IV SOLR: 3.375 gram | INTRAVENOUS | Qty: 3.38

## 2018-11-24 MED FILL — HYDROCODONE-ACETAMINOPHEN 5 MG-325 MG TAB: 5-325 mg | ORAL | Qty: 2

## 2018-11-24 MED FILL — VANCOMYCIN 750 MG IV SOLUTION: 750 mg | INTRAVENOUS | Qty: 750

## 2018-11-24 MED FILL — ZOLPIDEM 5 MG TAB: 5 mg | ORAL | Qty: 1

## 2018-11-24 NOTE — Progress Notes (Signed)
Bedside shift change report given to [place] (oncoming nurse) by [place] RN (offgoing nurse). Report included the following information SBAR, Kardex, ED Summary, Intake/Output, MAR and Recent Results.

## 2018-11-24 NOTE — Progress Notes (Signed)
Progress Notes by Jaye Beagle, MD at 11/24/18 1126                Author: Jaye Beagle, MD  Service: Internal Medicine  Author Type: Physician       Filed: 11/24/18 1158  Date of Service: 11/24/18 1126  Status: Addendum          Editor: Jaye Beagle, MD (Physician)          Related Notes: Original Note by Jaye Beagle, MD (Physician) filed at 11/24/18 [facility]                                                                                              Hospitalist Progress Note   Jaye Beagle, MD   Answering service: [phone] OR [phone] from in house phone            Date of Service:  11/24/2018   NAME:  Emily [name]   DOB:  1951/02/07   MRN:  [id number]           Admission Summary:         This is [name] with past medical history significant for left breast cancer status post lumpectomy and radiation treatment in 2014 and on chemotherapy, anxiety,  depression, osteoporosis, fibromyalgia, rheumatoid arthritis and headache, who presented to [facility] Emergency Department with left arm redness, pain, and fever since 04:00 a.m. this morning.  She stated that she woke up at 04:00 a.m. with left arm  redness which spread to her forearm and upper arm, associated with pain, 10/10, and fever, and decided to come to [facility] Emergency Department.  She stated that her son had strep and she completed Augmentin antibiotic for 5 days.  No nausea, vomiting,  left-sided chest pain, palpitation, abdominal pain, urinary complaint, or lower extremity swelling.  She has dry cough and mild shortness of breath.  In the ER when she was seen, her vital signs, blood pressure was 167/103, pulse 104, temperature 97.5,  saturation of oxygen 100% on room air.  Chest x-ray was done and no pneumonia.  She received IV ceftriaxone, vancomycin, and checked her lactic acid and her lactic acid was 1.6, and referred to hospitalist service  for further evaluation and admission.      Interval history / Subjective:        Per pt and family redness reduced  And improved    Reviewed CT will order DVT scan          Assessment & Plan:          1.  Left arm cellulitis.  A   Cont her on IV Zosyn and vancomycin and IV fluids Pain treatment p.r.n.        2.  Hypokalemia.  Resolved      3.  Hyponatremia, resolved      4.  Left breast cancer with neuroendocrine features, stage IIA, status post mastectomy in 2014 and completed  radiation treatment in 2014, and on chemotherapy.     - Chest x-ray showed some calcified nodules in the left hilar region   - CT done reviewed   - will get Doppler to r/o DVT   - Informed pt oncologist Dr. Geralyn Flash      5.  Anxiety and depression.   will continue home medication, Cymbalta 30 mg p.o. b.i.d.   6.  History of fibromyalgia, stable.    will give her p.r.n. Tylenol.   7.  History of headaches, stable.    will give her p.r.n. Fioricet.   8.  History of rheumatoid arthritis, stable   9. Abnormal LFT's? - mets ? Vs recent use of Augmentin vs hypotension or duloxetine?   Repeat lab tomorrow      Code status: Full   DVT prophylaxis: [facility]      Care Plan discussed with: Patient/Family and Nurse   Anticipated Disposition: Home w/Family   Anticipated Discharge: 24 hours to 48 hours           Hospital Problems   Date Reviewed:  08/15/2018                         Codes  Class  Noted  POA              Cellulitis of left arm  ICD-10-CM: L03.114   ICD-9-CM: 682.3    11/23/2018  Unknown                               Review of Systems:     A comprehensive review of systems was negative.            Vital Signs:      Last 24hrs VS reviewed since prior progress note. Most recent are:   Visit Vitals      BP  95/53 (BP 1 Location: Right arm, BP Patient Position: At rest)     Pulse  79     Temp  98.4 ??F (36.9 ??C)     Resp  16     Ht  5' 5.51" (1.664 m)     Wt  52.5 kg (115 lb 11.2 oz)     SpO2  96%     Breastfeeding  No        BMI  18.95 kg/m??           No  intake or output data in the 24 hours ending 11/24/18 1126         Physical Examination:                     Constitutional:   No acute distress, cooperative, pleasant      ENT:   Oral mucosa moist, oropharynx benign.      Resp:   CTA bilaterally. No wheezing/rhonchi/rales. No accessory muscle use     CV:   Regular rhythm, normal rate, no murmurs, gallops, rubs      GI:   Soft, non distended, non tender. normoactive bowel sounds, no hepatosplenomegaly       Musculoskeletal:   left UE swollen red and tender          Neurologic:   Moves all extremities.  AAOx3, CN II-XII reviewed       Psych:  Good insight, Not anxious nor agitated.  Data Review:      I personally reviewed  Image and labs           Labs:          Recent Labs            11/24/18   0515  11/23/18   0836     WBC  10.4  13.4*     HGB  11.0*  13.9     HCT  34.0*  42.9         PLT  172  213          Recent Labs            11/24/18   0515  11/23/18   0836     NA  137  133*     K  4.1  3.3*     CL  110*  100     CO2  23  29     BUN  6  8     CREA  0.38*  0.45*     GLU  95  62*         CA  7.3*  8.5          Recent Labs            11/24/18   0515  11/23/18   0836     SGOT  114*  63*     ALT  155*  65     AP  94  136*     TBILI  0.4  0.5     TP  5.7*  8.0     ALB  3.0*  4.6         GLOB  2.7  3.4        No results for input(s): INR, PTP, APTT, INREXT in the last 72 hours.    No results for input(s): FE, TIBC, PSAT, FERR in the last 72 hours.    No results found for: FOL, RBCF    No results for input(s): PH, PCO2, PO2 in the last 72 hours.   No results for input(s): CPK, CKNDX, TROIQ in the last 72 hours.      No lab exists for component: CPKMB     Lab Results         Component  Value  Date/Time            LDL,Direct  99  04/11/2008 10:20 AM          Lab Results         Component  Value  Date/Time            Glucose (POC)  73  10/15/2018 09:22 AM       Glucose (POC)  82  09/14/2015 09:44 AM       Glucose (POC)  85  03/10/2015 01:23 PM       Glucose  (POC)  135 (H)  10/01/2014 01:28 PM            Glucose (POC)  135 (H)  04/08/2014 02:43 PM          Lab Results         Component  Value  Date/Time            Color  YELLOW/STRAW  10/24/2018 09:14 AM       Appearance  CLEAR  10/24/2018 09:14 AM       Specific gravity  <1.005  10/24/2018 09:14 AM  pH (UA)  8.5 (H)  10/24/2018 09:14 AM       Protein  NEGATIVE   10/24/2018 09:14 AM       Glucose  NEGATIVE   10/24/2018 09:14 AM       Ketone  NEGATIVE   10/24/2018 09:14 AM       Bilirubin  NEGATIVE   10/24/2018 09:14 AM       Urobilinogen  0.2  10/24/2018 09:14 AM       Nitrites  NEGATIVE   10/24/2018 09:14 AM       Leukocyte Esterase  NEGATIVE   10/24/2018 09:14 AM       Epithelial cells  FEW  10/24/2018 09:14 AM       Bacteria  NEGATIVE   10/24/2018 09:14 AM       WBC  0-4  10/24/2018 09:14 AM            RBC  0-5  10/24/2018 09:14 AM                Medications Reviewed:          Current Facility-Administered Medications          Medication  Dose  Route  Frequency           ?  butalbital-acetaminophen-caffeine (FIORICET, ESGIC) 50-325-40 mg per tablet 1 Tab   1 Tab  Oral  Q6H PRN           ?  DULoxetine (CYMBALTA) capsule 30 mg   30 mg  Oral  BID     ?  zolpidem (AMBIEN) tablet 5 mg   5 mg  Oral  QHS PRN     ?  sodium chloride (NS) flush 5-40 mL   5-40 mL  IntraVENous  Q8H     ?  sodium chloride (NS) flush 5-40 mL   5-40 mL  IntraVENous  PRN     ?  piperacillin-tazobactam (ZOSYN) 3.375 g in 0.9% sodium chloride (MBP/ADV) 100 mL   3.375 g  IntraVENous  Q8H     ?  heparin (porcine) injection 5,000 Units   5,000 Units  SubCUTAneous  Q8H     ?  0.9% sodium chloride infusion   75 mL/hr  IntraVENous  CONTINUOUS     ?  HYDROcodone-acetaminophen (NORCO) 5-325 mg per tablet 1 Tab   1 Tab  Oral  Q4H PRN     ?  morphine injection 1 mg   1 mg  IntraVENous  Q4H PRN     ?  Vancomycin - pharmacy to dose     Other  Rx Dosing/Monitoring           ?  vancomycin (VANCOCIN) 750 mg in 0.9% sodium chloride (MBP/ADV) 250 mL   750 mg   IntraVENous  Q18H        ______________________________________________________________________   EXPECTED LENGTH OF STAY: - - -   ACTUAL LENGTH OF STAY:          1                    Jaye Beagle, MD

## 2018-11-24 NOTE — Progress Notes (Signed)
Bedside shift change report given to [place] (oncoming nurse) by [place] RN (offgoing nurse). Report included the following information SBAR, Kardex, ED Summary, Intake/Output, MAR and Recent Results.

## 2018-11-24 NOTE — Progress Notes (Addendum)
[facility]                                                                                          Hospitalist Progress Note  Jaye Beagle, MD  Answering service: [phone] OR [phone] from in house phone        Date of Service:  11/24/2018  NAME:  Emily [name]  DOB:  May 15, 1951  MRN:  [id number]      Admission Summary:    This is [name] with past medical history significant for left breast cancer status post lumpectomy and radiation treatment in 2014 and on chemotherapy, anxiety, depression, osteoporosis, fibromyalgia, rheumatoid arthritis and headache, who presented to [facility] Emergency Department with left arm redness, pain, and fever since 04:00 a.m. this morning.  She stated that she woke up at 04:00 a.m. with left arm redness which spread to her forearm and upper arm, associated with pain, 10/10, and fever, and decided to come to [facility] Emergency Department.  She stated that her son had strep and she completed Augmentin antibiotic for 5 days.  No nausea, vomiting, left-sided chest pain, palpitation, abdominal pain, urinary complaint, or lower extremity swelling.  She has dry cough and mild shortness of breath.  In the ER when she was seen, her vital signs, blood pressure was 167/103, pulse 104, temperature 97.5, saturation of oxygen 100% on room air.  Chest x-ray was done and no pneumonia.  She received IV ceftriaxone, vancomycin, and checked her lactic acid and her lactic acid was 1.6, and referred to hospitalist service for further evaluation and admission.    Interval history / Subjective:   Per pt and family redness reduced  And improved   Reviewed CT will order DVT scan     Assessment & Plan:     1.  Left arm cellulitis.  A  Cont her on IV Zosyn and vancomycin and IV fluids Pain treatment p.r.n.      2.  Hypokalemia.  Resolved    3.  Hyponatremia, resolved    4.  Left breast cancer with neuroendocrine features, stage IIA, status  post mastectomy in 2014 and completed radiation treatment in 2014, and on chemotherapy.    - Chest x-ray showed some calcified nodules in the left hilar region  - CT done reviewed  - will get Doppler to r/o DVT  - Informed pt oncologist Dr. Geralyn Flash    5.  Anxiety and depression.   will continue home medication, Cymbalta 30 mg p.o. b.i.d.  6.  History of fibromyalgia, stable.    will give her p.r.n. Tylenol.  7.  History of headaches, stable.    will give her p.r.n. Fioricet.  8.  History of rheumatoid arthritis, stable  9. Abnormal LFT's? - mets ? Vs recent use of Augmentin vs hypotension or duloxetine?  Repeat lab tomorrow    Code status: Full  DVT prophylaxis: [facility]    Care Plan discussed with: Patient/Family and Nurse  Anticipated Disposition: Home w/Family  Anticipated Discharge: 24 hours to 48 hours     Hospital Problems  Date Reviewed:  08/15/2018          Codes Class Noted POA    Cellulitis of left arm ICD-10-CM: L03.114  ICD-9-CM: 682.3  11/23/2018 Unknown                Review of Systems:   A comprehensive review of systems was negative.       Vital Signs:    Last 24hrs VS reviewed since prior progress note. Most recent are:  Visit Vitals  BP 95/53 (BP 1 Location: Right arm, BP Patient Position: At rest)   Pulse 79   Temp 98.4 ??F (36.9 ??C)   Resp 16   Ht 5' 5.51" (1.664 m)   Wt 52.5 kg (115 lb 11.2 oz)   SpO2 96%   Breastfeeding No   BMI 18.95 kg/m??       No intake or output data in the 24 hours ending 11/24/18 1126     Physical Examination:             Constitutional:  No acute distress, cooperative, pleasant    ENT:  Oral mucosa moist, oropharynx benign.    Resp:  CTA bilaterally. No wheezing/rhonchi/rales. No accessory muscle use   CV:  Regular rhythm, normal rate, no murmurs, gallops, rubs    GI:  Soft, non distended, non tender. normoactive bowel sounds, no hepatosplenomegaly     Musculoskeletal:  left UE swollen red and tender     Neurologic:  Moves all extremities.  AAOx3, CN II-XII reviewed      Psych:  Good insight, Not anxious nor agitated.       Data Review:    I personally reviewed  Image and labs      Labs:     Recent Labs     11/24/18  0515 11/23/18  0836   WBC 10.4 13.4*   HGB 11.0* 13.9   HCT 34.0* 42.9   PLT 172 213     Recent Labs     11/24/18  0515 11/23/18  0836   NA 137 133*   K 4.1 3.3*   CL 110* 100   CO2 23 29   BUN 6 8   CREA 0.38* 0.45*   GLU 95 62*   CA 7.3* 8.5     Recent Labs     11/24/18  0515 11/23/18  0836   SGOT 114* 63*   ALT 155* 65   AP 94 136*   TBILI 0.4 0.5   TP 5.7* 8.0   ALB 3.0* 4.6   GLOB 2.7 3.4     No results for input(s): INR, PTP, APTT, INREXT in the last 72 hours.   No results for input(s): FE, TIBC, PSAT, FERR in the last 72 hours.   No results found for: FOL, RBCF   No results for input(s): PH, PCO2, PO2 in the last 72 hours.  No results for input(s): CPK, CKNDX, TROIQ in the last 72 hours.    No lab exists for component: CPKMB  Lab Results   Component Value Date/Time    LDL,Direct 99 04/11/2008 10:20 AM     Lab Results   Component Value Date/Time    Glucose (POC) 73 10/15/2018 09:22 AM    Glucose (POC) 82 09/14/2015 09:44 AM    Glucose (POC) 85 03/10/2015 01:23 PM    Glucose (POC) 135 (H) 10/01/2014 01:28 PM    Glucose (POC) 135 (H) 04/08/2014 02:43 PM     Lab Results   Component Value Date/Time  Color YELLOW/STRAW 10/24/2018 09:14 AM    Appearance CLEAR 10/24/2018 09:14 AM    Specific gravity <1.005 10/24/2018 09:14 AM    pH (UA) 8.5 (H) 10/24/2018 09:14 AM    Protein NEGATIVE  10/24/2018 09:14 AM    Glucose NEGATIVE  10/24/2018 09:14 AM    Ketone NEGATIVE  10/24/2018 09:14 AM    Bilirubin NEGATIVE  10/24/2018 09:14 AM    Urobilinogen 0.2 10/24/2018 09:14 AM    Nitrites NEGATIVE  10/24/2018 09:14 AM    Leukocyte Esterase NEGATIVE  10/24/2018 09:14 AM    Epithelial cells FEW 10/24/2018 09:14 AM    Bacteria NEGATIVE  10/24/2018 09:14 AM    WBC 0-4 10/24/2018 09:14 AM    RBC 0-5 10/24/2018 09:14 AM         Medications Reviewed:      Current Facility-Administered Medications   Medication Dose Route Frequency   ??? butalbital-acetaminophen-caffeine (FIORICET, ESGIC) 50-325-40 mg per tablet 1 Tab  1 Tab Oral Q6H PRN   ??? DULoxetine (CYMBALTA) capsule 30 mg  30 mg Oral BID   ??? zolpidem (AMBIEN) tablet 5 mg  5 mg Oral QHS PRN   ??? sodium chloride (NS) flush 5-40 mL  5-40 mL IntraVENous Q8H   ??? sodium chloride (NS) flush 5-40 mL  5-40 mL IntraVENous PRN   ??? piperacillin-tazobactam (ZOSYN) 3.375 g in 0.9% sodium chloride (MBP/ADV) 100 mL  3.375 g IntraVENous Q8H   ??? heparin (porcine) injection 5,000 Units  5,000 Units SubCUTAneous Q8H   ??? 0.9% sodium chloride infusion  75 mL/hr IntraVENous CONTINUOUS   ??? HYDROcodone-acetaminophen (NORCO) 5-325 mg per tablet 1 Tab  1 Tab Oral Q4H PRN   ??? morphine injection 1 mg  1 mg IntraVENous Q4H PRN   ??? Vancomycin - pharmacy to dose   Other Rx Dosing/Monitoring   ??? vancomycin (VANCOCIN) 750 mg in 0.9% sodium chloride (MBP/ADV) 250 mL  750 mg IntraVENous Q18H     ______________________________________________________________________  EXPECTED LENGTH OF STAY: - - -  ACTUAL LENGTH OF STAY:          1                 Jaye Beagle, MD

## 2018-11-25 LAB — COMPREHENSIVE METABOLIC PANEL
ALT: 122 U/L — ABNORMAL HIGH (ref 12–78)
AST: 64 U/L — ABNORMAL HIGH (ref 15–37)
Albumin/Globulin Ratio: 1.1 (ref 1.1–2.2)
Albumin: 3 g/dL — ABNORMAL LOW (ref 3.5–5.0)
Alkaline Phosphatase: 88 U/L (ref 45–117)
Anion Gap: 4 mmol/L — ABNORMAL LOW (ref 5–15)
BUN: 6 MG/DL (ref 6–20)
Bun/Cre Ratio: 18 (ref 12–20)
CO2: 24 mmol/L (ref 21–32)
Calcium: 8 MG/DL — ABNORMAL LOW (ref 8.5–10.1)
Chloride: 113 mmol/L — ABNORMAL HIGH (ref 97–108)
Creatinine: 0.33 MG/DL — ABNORMAL LOW (ref 0.55–1.02)
EGFR IF NonAfrican American: >60 mL/min/{1.73_m2} (ref >60)
GFR African American: >60 mL/min/{1.73_m2} (ref >60)
Globulin: 2.8 g/dL (ref 2.0–4.0)
Glucose: 87 mg/dL (ref 65–100)
Potassium: 4.2 mmol/L (ref 3.5–5.1)
Sodium: 141 mmol/L (ref 136–145)
Total Bilirubin: 0.3 MG/DL (ref 0.2–1.0)
Total Protein: 5.8 g/dL — ABNORMAL LOW (ref 6.4–8.2)

## 2018-11-25 LAB — VANCOMYCIN TROUGH: Vancomycin Tr: 2.6 ug/mL — ABNORMAL LOW (ref 5.0–10.0)

## 2018-11-25 LAB — VANCOMYCIN, TROUGH: Vancomycin,trough: 2.6 ug/mL — ABNORMAL LOW (ref 5.0–10.0)

## 2018-11-25 LAB — METABOLIC PANEL, COMPREHENSIVE
A-G Ratio: 1.1 (ref 1.1–2.2)
ALT (SGPT): 122 U/L — ABNORMAL HIGH (ref 12–78)
AST (SGOT): 64 U/L — ABNORMAL HIGH (ref 15–37)
Albumin: 3 g/dL — ABNORMAL LOW (ref 3.5–5.0)
Alk. phosphatase: 88 U/L (ref 45–117)
Anion gap: 4 mmol/L — ABNORMAL LOW (ref 5–15)
BUN/Creatinine ratio: 18 (ref 12–20)
BUN: 6 MG/DL (ref 6–20)
Bilirubin, total: 0.3 MG/DL (ref 0.2–1.0)
CO2: 24 mmol/L (ref 21–32)
Calcium: 8 MG/DL — ABNORMAL LOW (ref 8.5–10.1)
Chloride: 113 mmol/L — ABNORMAL HIGH (ref 97–108)
Creatinine: 0.33 MG/DL — ABNORMAL LOW (ref 0.55–1.02)
GFR est AA: >60 mL/min/{1.73_m2} (ref >60)
GFR est non-AA: >60 mL/min/{1.73_m2} (ref >60)
Globulin: 2.8 g/dL (ref 2.0–4.0)
Glucose: 87 mg/dL (ref 65–100)
Potassium: 4.2 mmol/L (ref 3.5–5.1)
Protein, total: 5.8 g/dL — ABNORMAL LOW (ref 6.4–8.2)
Sodium: 141 mmol/L (ref 136–145)

## 2018-11-25 MED ORDER — PHARMACY VANCOMYCIN NOTE
Freq: Once | Status: AC
Start: 2018-11-25 — End: 2018-11-25
  Administered 2018-11-25: 22:00:00

## 2018-11-25 MED ORDER — VANCOMYCIN 1,000 MG IV SOLR
1000 mg | Freq: Two times a day (BID) | INTRAVENOUS | Status: DC
Start: 2018-11-25 — End: 2018-11-26
  Administered 2018-11-26: 12:00:00 via INTRAVENOUS

## 2018-11-25 MED ORDER — DULOXETINE 20 MG CAP, DELAYED RELEASE
20 mg | Freq: Once | ORAL | Status: AC
Start: 2018-11-25 — End: 2018-11-25
  Administered 2018-11-25: 23:00:00 via ORAL

## 2018-11-25 MED ORDER — HYDROCORTISONE 1 % TOPICAL CREAM
1 % | Freq: Two times a day (BID) | CUTANEOUS | Status: DC
Start: 2018-11-25 — End: 2018-11-26
  Administered 2018-11-25 – 2018-11-26 (×4): via TOPICAL

## 2018-11-25 MED ORDER — DULOXETINE 20 MG CAP, DELAYED RELEASE
20 mg | Freq: Two times a day (BID) | ORAL | Status: DC
Start: 2018-11-25 — End: 2018-11-25

## 2018-11-25 MED ORDER — DULOXETINE 20 MG CAP, DELAYED RELEASE
20 mg | Freq: Two times a day (BID) | ORAL | Status: DC
Start: 2018-11-25 — End: 2018-11-26
  Administered 2018-11-25 – 2018-11-26 (×2): via ORAL

## 2018-11-25 MED FILL — VANCOMYCIN 750 MG IV SOLUTION: 750 mg | INTRAVENOUS | Qty: 750

## 2018-11-25 MED FILL — PIPERACILLIN-TAZOBACTAM 3.375 GRAM IV SOLR: 3.375 gram | INTRAVENOUS | Qty: 3.38

## 2018-11-25 MED FILL — HYDROCODONE-ACETAMINOPHEN 5 MG-325 MG TAB: 5-325 mg | ORAL | Qty: 1

## 2018-11-25 MED FILL — NORMAL SALINE FLUSH 0.9 % INJECTION SYRINGE: INTRAMUSCULAR | Qty: 10

## 2018-11-25 MED FILL — MORPHINE 2 MG/ML INJECTION: 2 mg/mL | INTRAMUSCULAR | Qty: 1

## 2018-11-25 MED FILL — DULOXETINE 20 MG CAP, DELAYED RELEASE: 20 mg | ORAL | Qty: 1

## 2018-11-25 MED FILL — PHARMACY VANCOMYCIN NOTE: Qty: 1

## 2018-11-25 MED FILL — ZOLPIDEM 5 MG TAB: 5 mg | ORAL | Qty: 1

## 2018-11-25 MED FILL — DOK 100 MG CAPSULE: 100 mg | ORAL | Qty: 2

## 2018-11-25 MED FILL — HYDROCODONE-ACETAMINOPHEN 5 MG-325 MG TAB: 5-325 mg | ORAL | Qty: 2

## 2018-11-25 MED FILL — HYDROCORTISONE-ALOE VERA 1 % TOPICAL CREAM: 1 % | CUTANEOUS | Qty: 28

## 2018-11-25 NOTE — Progress Notes (Signed)
Spiritual Care Partner Volunteer visited patient in Rm 509 on 11/25/18.  Documented by:  [address] Chaplain, MDiv, MACE  287 [address] [phone])

## 2018-11-25 NOTE — Progress Notes (Signed)
Progress Notes by Jaye Beagle, MD at 11/25/18 1227                Author: Jaye Beagle, MD  Service: Internal Medicine  Author Type: Physician       Filed: 11/25/18 1230  Date of Service: 11/25/18 1227  Status: Signed          Editor: Jaye Beagle, MD (Physician)                    [facility]                                                                                              Hospitalist Progress Note   Jaye Beagle, MD   Answering service: [phone] OR [phone] from in house phone            Date of Service:  11/25/2018   NAME:  Emily [name]   DOB:  May 08, 1951   MRN:  [id number]           Admission Summary:         This is [name] with past medical history significant for left breast cancer status post lumpectomy and radiation treatment in 2014 and on chemotherapy, anxiety,  depression, osteoporosis, fibromyalgia, rheumatoid arthritis and headache, who presented to [facility] Emergency Department with left arm redness, pain, and fever since 04:00 a.m. this morning.  She stated that she woke up at 04:00 a.m. with left arm  redness which spread to her forearm and upper arm, associated with pain, 10/10, and fever, and decided to come to [facility] Emergency Department.  She stated that her son had strep and she completed Augmentin antibiotic for 5 days.  No nausea, vomiting,  left-sided chest pain, palpitation, abdominal pain, urinary complaint, or lower extremity swelling.  She has dry cough and mild shortness of breath.  In the ER when she was seen, her vital signs, blood pressure was 167/103, pulse 104, temperature 97.5,  saturation of oxygen 100% on room air.  Chest x-ray was done and no pneumonia.  She received IV ceftriaxone, vancomycin, and checked her lactic acid and her lactic acid was 1.6, and referred to hospitalist service for further evaluation and admission.      Interval history / Subjective:        DVT scan neg/  cellulitis almost gone, pt requesting Cymbalta    LFT's improved           Assessment & Plan:          1.  Left arm cellulitis.   Cont her on IV Zosyn and vancomycin and IV fluids Pain treatment p.r.n.     Can get an early dose of vanc and d/c tomorrow on keflex she took Augmentin before       2.  Hypokalemia.  Resolved      3.  Hyponatremia, resolved      4.  Left breast cancer with neuroendocrine features, stage IIA, status post mastectomy in 2014 and completed radiation treatment in 2014, and on chemotherapy.     -  Chest x-ray showed some calcified nodules in the left hilar region   - CT done reviewed   - will get Doppler to r/o DVT   - Informed pt oncologist Dr. Geralyn Flash      5.  Anxiety and depression.   will continue home medication, Cymbalta 20 mg p.o. b.i.d. although LFT's still not normal d/w pt she is requesting to start on lower dose       6.  History of fibromyalgia, stable.    will give her p.r.n. Tylenol.   7.  History of headaches, stable.    will give her p.r.n. Fioricet.   8.  History of rheumatoid arthritis, stable   9. Abnormal LFT's? - mets ? Vs recent use of Augmentin vs hypotension or duloxetine?   Repeat lab improved enzymes may f/u out pt with PCP/ oncology       Code status: Full   DVT prophylaxis: [place] discussed with: Patient/Family and Nurse   Anticipated Disposition: Home w/Family   Anticipated Discharge: 24  hours           Hospital Problems   Date Reviewed:  08/15/2018                         Codes  Class  Noted  POA              Cellulitis of left arm  ICD-10-CM: L03.114   ICD-9-CM: 682.3    11/23/2018  Unknown                               Review of Systems:     A comprehensive review of systems was negative.            Vital Signs:      Last 24hrs VS reviewed since prior progress note. Most recent are:   Visit Vitals      BP  111/49 (BP 1 Location: Right arm, BP Patient Position: At rest)     Pulse  74     Temp  98 ??F (36.7 ??C)     Resp  16     Ht  5' 5.51" (1.664 m)      Wt  52.5 kg (115 lb 11.2 oz)     SpO2  97%     Breastfeeding  No        BMI  18.95 kg/m??           No intake or output data in the 24 hours ending 11/25/18 1227         Physical Examination:                     Constitutional:   No acute distress, cooperative, pleasant      ENT:   Oral mucosa moist, oropharynx benign.      Resp:   CTA bilaterally. No wheezing/rhonchi/rales. No accessory muscle use     CV:   Regular rhythm, normal rate, no murmurs, gallops, rubs      GI:   Soft, non distended, non tender. normoactive bowel sounds, no hepatosplenomegaly       Musculoskeletal:   left UE mildly tender / rash improved         Neurologic:   Moves all extremities.  AAOx3, CN II-XII reviewed       Psych:  Good insight, Not anxious nor  agitated.               Data Review:      I personally reviewed  Image and labs           Labs:          Recent Labs            11/24/18   0515  11/23/18   0836     WBC  10.4  13.4*     HGB  11.0*  13.9     HCT  34.0*  42.9         PLT  172  213          Recent Labs             11/25/18   0450  11/24/18   0515  11/23/18   0836     NA  141  137  133*     K  4.2  4.1  3.3*     CL  113*  110*  100     CO2  24  23  29      BUN  6  6  8      CREA  0.33*  0.38*  0.45*     GLU  87  95  62*          CA  8.0*  7.3*  8.5          Recent Labs             11/25/18   0450  11/24/18   0515  11/23/18   0836     SGOT  64*  114*  63*     ALT  122*  155*  65     AP  88  94  136*     TBILI  0.3  0.4  0.5     TP  5.8*  5.7*  8.0     ALB  3.0*  3.0*  4.6          GLOB  2.8  2.7  3.4        No results for input(s): INR, PTP, APTT, INREXT, INREXT in the last 72 hours.    No results for input(s): FE, TIBC, PSAT, FERR in the last 72 hours.    No results found for: FOL, RBCF    No results for input(s): PH, PCO2, PO2 in the last 72 hours.   No results for input(s): CPK, CKNDX, TROIQ in the last 72 hours.      No lab exists for component: CPKMB     Lab Results         Component  Value  Date/Time            LDL,Direct  99   04/11/2008 10:20 AM          Lab Results         Component  Value  Date/Time            Glucose (POC)  73  10/15/2018 09:22 AM       Glucose (POC)  82  09/14/2015 09:44 AM       Glucose (POC)  85  03/10/2015 01:23 PM       Glucose (POC)  135 (H)  10/01/2014 01:28 PM            Glucose (POC)  135 (H)  04/08/2014 02:43 PM          Lab  Results         Component  Value  Date/Time            Color  YELLOW/STRAW  10/24/2018 09:14 AM       Appearance  CLEAR  10/24/2018 09:14 AM       Specific gravity  <1.005  10/24/2018 09:14 AM       pH (UA)  8.5 (H)  10/24/2018 09:14 AM       Protein  NEGATIVE   10/24/2018 09:14 AM       Glucose  NEGATIVE   10/24/2018 09:14 AM       Ketone  NEGATIVE   10/24/2018 09:14 AM       Bilirubin  NEGATIVE   10/24/2018 09:14 AM       Urobilinogen  0.2  10/24/2018 09:14 AM       Nitrites  NEGATIVE   10/24/2018 09:14 AM       Leukocyte Esterase  NEGATIVE   10/24/2018 09:14 AM       Epithelial cells  FEW  10/24/2018 09:14 AM       Bacteria  NEGATIVE   10/24/2018 09:14 AM       WBC  0-4  10/24/2018 09:14 AM            RBC  0-5  10/24/2018 09:14 AM                Medications Reviewed:          Current Facility-Administered Medications          Medication  Dose  Route  Frequency           ?  Vancomycin:  Please draw a trough immediately prior to the dose due at 17:00 today, 11/25/2018.      Other  ONCE           ?  DULoxetine (CYMBALTA) capsule 20 mg   20 mg  Oral  BID     ?  hydrocortisone (CORTAID) 1 % cream     Topical  BID     ?  HYDROcodone-acetaminophen (NORCO) 5-325 mg per tablet 1-2 Tab   1-2 Tab  Oral  Q4H PRN     ?  docusate sodium (COLACE) capsule 200 mg   200 mg  Oral  DAILY     ?  butalbital-acetaminophen-caffeine (FIORICET, ESGIC) 50-325-40 mg per tablet 1 Tab   1 Tab  Oral  Q6H PRN     ?  zolpidem (AMBIEN) tablet 5 mg   5 mg  Oral  QHS PRN     ?  sodium chloride (NS) flush 5-40 mL   5-40 mL  IntraVENous  Q8H     ?  sodium chloride (NS) flush 5-40 mL   5-40 mL  IntraVENous  PRN     ?   piperacillin-tazobactam (ZOSYN) 3.375 g in 0.9% sodium chloride (MBP/ADV) 100 mL   3.375 g  IntraVENous  Q8H     ?  heparin (porcine) injection 5,000 Units   5,000 Units  SubCUTAneous  Q8H     ?  morphine injection 1 mg   1 mg  IntraVENous  Q4H PRN     ?  Vancomycin - pharmacy to dose     Other  Rx Dosing/Monitoring           ?  vancomycin (VANCOCIN) 750 mg in 0.9% sodium chloride (MBP/ADV) 250 mL   750 mg  IntraVENous  Q18H  ______________________________________________________________________   EXPECTED LENGTH OF STAY: - - -   ACTUAL LENGTH OF STAY:          2                    Jaye Beagle, MD

## 2018-11-25 NOTE — Progress Notes (Signed)
0639: this nurse talked to nutrition services to order patients breakfast this morning. Patient tried to yesterday but was unable to get through to them.     Bedside shift change report given to [place] (oncoming nurse) by [place] RN (offgoing nurse). Report included the following information SBAR, Kardex, Intake/Output, MAR and Recent Results.

## 2018-11-25 NOTE — Progress Notes (Signed)
 Pharmacist Note - Vancomycin  Dosing  Therapy day 3  Indication: SSTI  Current regimen: 750 mg IV Q18hrs    A Trough Level resulted at 2.6 mcg/mL which was obtained 19 hrs post-dose.  The extrapolated true trough is approximately 3 mcg/mL based on the patient's known kinetics.       Goal trough: 10 - 15 mcg/mL     Plan: Change to 1000 mg IV Q12hrs . Pharmacy will continue to monitor this patient daily for changes in clinical status and renal function.

## 2018-11-25 NOTE — Progress Notes (Signed)
[facility]                                                                                          Hospitalist Progress Note  Jaye Beagle, MD  Answering service: [phone] OR [phone] from in house phone        Date of Service:  11/25/2018  NAME:  Emily [name]  DOB:  Feb 04, 1951  MRN:  [id number]      Admission Summary:    This is [name] with past medical history significant for left breast cancer status post lumpectomy and radiation treatment in 2014 and on chemotherapy, anxiety, depression, osteoporosis, fibromyalgia, rheumatoid arthritis and headache, who presented to [facility] Emergency Department with left arm redness, pain, and fever since 04:00 a.m. this morning.  She stated that she woke up at 04:00 a.m. with left arm redness which spread to her forearm and upper arm, associated with pain, 10/10, and fever, and decided to come to [facility] Emergency Department.  She stated that her son had strep and she completed Augmentin antibiotic for 5 days.  No nausea, vomiting, left-sided chest pain, palpitation, abdominal pain, urinary complaint, or lower extremity swelling.  She has dry cough and mild shortness of breath.  In the ER when she was seen, her vital signs, blood pressure was 167/103, pulse 104, temperature 97.5, saturation of oxygen 100% on room air.  Chest x-ray was done and no pneumonia.  She received IV ceftriaxone, vancomycin, and checked her lactic acid and her lactic acid was 1.6, and referred to hospitalist service for further evaluation and admission.    Interval history / Subjective:   DVT scan neg/ cellulitis almost gone, pt requesting Cymbalta   LFT's improved      Assessment & Plan:     1.  Left arm cellulitis.  Cont her on IV Zosyn and vancomycin and IV fluids Pain treatment p.r.n.    Can get an early dose of vanc and d/c tomorrow on keflex she took Augmentin before     2.  Hypokalemia.  Resolved     3.  Hyponatremia, resolved    4.  Left breast cancer with neuroendocrine features, stage IIA, status post mastectomy in 2014 and completed radiation treatment in 2014, and on chemotherapy.    - Chest x-ray showed some calcified nodules in the left hilar region  - CT done reviewed  - will get Doppler to r/o DVT  - Informed pt oncologist Dr. Geralyn Flash    5.  Anxiety and depression.   will continue home medication, Cymbalta 20 mg p.o. b.i.d. although LFT's still not normal d/w pt she is requesting to start on lower dose     6.  History of fibromyalgia, stable.    will give her p.r.n. Tylenol.  7.  History of headaches, stable.    will give her p.r.n. Fioricet.  8.  History of rheumatoid arthritis, stable  9. Abnormal LFT's? - mets ? Vs recent use of Augmentin vs hypotension or duloxetine?  Repeat lab improved enzymes may f/u out pt with PCP/ oncology  Code status: Full  DVT prophylaxis: [facility]    Care Plan discussed with: Patient/Family and Nurse  Anticipated Disposition: Home w/Family  Anticipated Discharge: 24  hours     Hospital Problems  Date Reviewed: 08-17-2018          Codes Class Noted POA    Cellulitis of left arm ICD-10-CM: L03.114  ICD-9-CM: 682.3  11/23/2018 Unknown                Review of Systems:   A comprehensive review of systems was negative.       Vital Signs:    Last 24hrs VS reviewed since prior progress note. Most recent are:  Visit Vitals  BP 111/49 (BP 1 Location: Right arm, BP Patient Position: At rest)   Pulse 74   Temp 98 ??F (36.7 ??C)   Resp 16   Ht 5' 5.51" (1.664 m)   Wt 52.5 kg (115 lb 11.2 oz)   SpO2 97%   Breastfeeding No   BMI 18.95 kg/m??       No intake or output data in the 24 hours ending 11/25/18 1227     Physical Examination:             Constitutional:  No acute distress, cooperative, pleasant    ENT:  Oral mucosa moist, oropharynx benign.    Resp:  CTA bilaterally. No wheezing/rhonchi/rales. No accessory muscle use   CV:  Regular rhythm, normal rate, no murmurs, gallops, rubs     GI:  Soft, non distended, non tender. normoactive bowel sounds, no hepatosplenomegaly     Musculoskeletal:  left UE mildly tender / rash improved    Neurologic:  Moves all extremities.  AAOx3, CN II-XII reviewed     Psych:  Good insight, Not anxious nor agitated.       Data Review:    I personally reviewed  Image and labs      Labs:     Recent Labs     11/24/18  0515 11/23/18  0836   WBC 10.4 13.4*   HGB 11.0* 13.9   HCT 34.0* 42.9   PLT 172 213     Recent Labs     11/25/18  0450 11/24/18  0515 11/23/18  0836   NA 141 137 133*   K 4.2 4.1 3.3*   CL 113* 110* 100   CO2 24 23 29    BUN 6 6 8    CREA 0.33* 0.38* 0.45*   GLU 87 95 62*   CA 8.0* 7.3* 8.5     Recent Labs     11/25/18  0450 11/24/18  0515 11/23/18  0836   SGOT 64* 114* 63*   ALT 122* 155* 65   AP 88 94 136*   TBILI 0.3 0.4 0.5   TP 5.8* 5.7* 8.0   ALB 3.0* 3.0* 4.6   GLOB 2.8 2.7 3.4     No results for input(s): INR, PTP, APTT, INREXT, INREXT in the last 72 hours.   No results for input(s): FE, TIBC, PSAT, FERR in the last 72 hours.   No results found for: FOL, RBCF   No results for input(s): PH, PCO2, PO2 in the last 72 hours.  No results for input(s): CPK, CKNDX, TROIQ in the last 72 hours.    No lab exists for component: CPKMB  Lab Results   Component Value Date/Time    LDL,Direct 99 04/11/2008 10:20 AM     Lab Results   Component Value Date/Time  Glucose (POC) 73 10/15/2018 09:22 AM    Glucose (POC) 82 09/14/2015 09:44 AM    Glucose (POC) 85 03/10/2015 01:23 PM    Glucose (POC) 135 (H) 10/01/2014 01:28 PM    Glucose (POC) 135 (H) 04/08/2014 02:43 PM     Lab Results   Component Value Date/Time    Color YELLOW/STRAW 10/24/2018 09:14 AM    Appearance CLEAR 10/24/2018 09:14 AM    Specific gravity <1.005 10/24/2018 09:14 AM    pH (UA) 8.5 (H) 10/24/2018 09:14 AM    Protein NEGATIVE  10/24/2018 09:14 AM    Glucose NEGATIVE  10/24/2018 09:14 AM    Ketone NEGATIVE  10/24/2018 09:14 AM    Bilirubin NEGATIVE  10/24/2018 09:14 AM     Urobilinogen 0.2 10/24/2018 09:14 AM    Nitrites NEGATIVE  10/24/2018 09:14 AM    Leukocyte Esterase NEGATIVE  10/24/2018 09:14 AM    Epithelial cells FEW 10/24/2018 09:14 AM    Bacteria NEGATIVE  10/24/2018 09:14 AM    WBC 0-4 10/24/2018 09:14 AM    RBC 0-5 10/24/2018 09:14 AM         Medications Reviewed:     Current Facility-Administered Medications   Medication Dose Route Frequency   ??? Vancomycin:  Please draw a trough immediately prior to the dose due at 17:00 today, 11/25/2018.    Other ONCE   ??? DULoxetine (CYMBALTA) capsule 20 mg  20 mg Oral BID   ??? hydrocortisone (CORTAID) 1 % cream   Topical BID   ??? HYDROcodone-acetaminophen (NORCO) 5-325 mg per tablet 1-2 Tab  1-2 Tab Oral Q4H PRN   ??? docusate sodium (COLACE) capsule 200 mg  200 mg Oral DAILY   ??? butalbital-acetaminophen-caffeine (FIORICET, ESGIC) 50-325-40 mg per tablet 1 Tab  1 Tab Oral Q6H PRN   ??? zolpidem (AMBIEN) tablet 5 mg  5 mg Oral QHS PRN   ??? sodium chloride (NS) flush 5-40 mL  5-40 mL IntraVENous Q8H   ??? sodium chloride (NS) flush 5-40 mL  5-40 mL IntraVENous PRN   ??? piperacillin-tazobactam (ZOSYN) 3.375 g in 0.9% sodium chloride (MBP/ADV) 100 mL  3.375 g IntraVENous Q8H   ??? heparin (porcine) injection 5,000 Units  5,000 Units SubCUTAneous Q8H   ??? morphine injection 1 mg  1 mg IntraVENous Q4H PRN   ??? Vancomycin - pharmacy to dose   Other Rx Dosing/Monitoring   ??? vancomycin (VANCOCIN) 750 mg in 0.9% sodium chloride (MBP/ADV) 250 mL  750 mg IntraVENous Q18H     ______________________________________________________________________  EXPECTED LENGTH OF STAY: - - -  ACTUAL LENGTH OF STAY:          2                 Jaye Beagle, MD

## 2018-11-25 NOTE — Progress Notes (Signed)
Spiritual Care Partner Volunteer visited patient in Rm 509 on 11/25/18.  Documented by:  [address], Chaplain, MDiv, MACE  [address] [phone])

## 2018-11-25 NOTE — Progress Notes (Addendum)
0639: this nurse talked to nutrition services to order patients breakfast this morning. Patient tried to yesterday but was unable to get through to them.     Bedside shift change report given to [place] (oncoming nurse) by [place] RN (offgoing nurse). Report included the following information SBAR, Kardex, Intake/Output, MAR and Recent Results.

## 2018-11-25 NOTE — Progress Notes (Signed)
Pharmacist Note - Vancomycin Dosing  Therapy day 3  Indication: SSTI  Current regimen: 750 mg IV Q18hrs    A Trough Level resulted at 2.6 mcg/mL which was obtained 19 hrs post-dose.  The extrapolated "true" trough is approximately 3 mcg/mL based on the patient's known kinetics.       Goal trough: 10 - 15 mcg/mL     Plan: Change to 1000 mg IV Q12hrs . Pharmacy will continue to monitor this patient daily for changes in clinical status and renal function.

## 2018-11-26 LAB — BASIC METABOLIC PANEL
Anion Gap: 6 mmol/L (ref 5–15)
BUN: 9 MG/DL (ref 6–20)
Bun/Cre Ratio: 18 (ref 12–20)
CO2: 25 mmol/L (ref 21–32)
Calcium: 8.4 MG/DL — ABNORMAL LOW (ref 8.5–10.1)
Chloride: 107 mmol/L (ref 97–108)
Creatinine: 0.5 MG/DL — ABNORMAL LOW (ref 0.55–1.02)
EGFR IF NonAfrican American: >60 mL/min/{1.73_m2} (ref >60)
GFR African American: >60 mL/min/{1.73_m2} (ref >60)
Glucose: 75 mg/dL (ref 65–100)
Potassium: 3.9 mmol/L (ref 3.5–5.1)
Sodium: 138 mmol/L (ref 136–145)

## 2018-11-26 LAB — METABOLIC PANEL, BASIC
Anion gap: 6 mmol/L (ref 5–15)
BUN/Creatinine ratio: 18 (ref 12–20)
BUN: 9 MG/DL (ref 6–20)
CO2: 25 mmol/L (ref 21–32)
Calcium: 8.4 MG/DL — ABNORMAL LOW (ref 8.5–10.1)
Chloride: 107 mmol/L (ref 97–108)
Creatinine: 0.5 MG/DL — ABNORMAL LOW (ref 0.55–1.02)
GFR est AA: >60 mL/min/{1.73_m2} (ref >60)
GFR est non-AA: >60 mL/min/{1.73_m2} (ref >60)
Glucose: 75 mg/dL (ref 65–100)
Potassium: 3.9 mmol/L (ref 3.5–5.1)
Sodium: 138 mmol/L (ref 136–145)

## 2018-11-26 MED ORDER — PREDNISONE 20 MG TAB
20 mg | Freq: Every day | ORAL | Status: DC
Start: 2018-11-26 — End: 2018-11-26
  Administered 2018-11-26: 17:00:00 via ORAL

## 2018-11-26 MED ORDER — PREDNISONE 20 MG TAB
20 mg | Freq: Every day | ORAL | Status: DC
Start: 2018-11-26 — End: 2018-11-26

## 2018-11-26 MED ORDER — HYDROCORTISONE 1 % TOPICAL CREAM
1 % | Freq: Two times a day (BID) | CUTANEOUS | Status: DC
Start: 2018-11-26 — End: 2018-11-26

## 2018-11-26 MED ORDER — DOXYCYCLINE 100 MG CAP
100 mg | ORAL_CAPSULE | Freq: Two times a day (BID) | ORAL | 0 refills | Status: AC
Start: 2018-11-26 — End: 2018-11-29

## 2018-11-26 MED ORDER — PREDNISONE 20 MG TAB
20 mg | ORAL_TABLET | Freq: Every day | ORAL | 0 refills | Status: AC
Start: 2018-11-26 — End: 2018-12-01

## 2018-11-26 MED ORDER — AMOXICILLIN CLAVULANATE 875 MG-125 MG TAB
875-125 mg | ORAL_TABLET | Freq: Two times a day (BID) | ORAL | 0 refills | Status: AC
Start: 2018-11-26 — End: 2018-11-29

## 2018-11-26 MED FILL — HYDROCODONE-ACETAMINOPHEN 5 MG-325 MG TAB: 5-325 mg | ORAL | Qty: 2

## 2018-11-26 MED FILL — NORMAL SALINE FLUSH 0.9 % INJECTION SYRINGE: INTRAMUSCULAR | Qty: 10

## 2018-11-26 MED FILL — VANCOMYCIN 1,000 MG IV SOLR: 1000 mg | INTRAVENOUS | Qty: 1000

## 2018-11-26 MED FILL — HYDROCORTISONE 1 % TOPICAL CREAM: 1 % | CUTANEOUS | Qty: 28

## 2018-11-26 MED FILL — DULOXETINE 20 MG CAP, DELAYED RELEASE: 20 mg | ORAL | Qty: 1

## 2018-11-26 MED FILL — PIPERACILLIN-TAZOBACTAM 3.375 GRAM IV SOLR: 3.375 gram | INTRAVENOUS | Qty: 3.38

## 2018-11-26 MED FILL — ZOLPIDEM 5 MG TAB: 5 mg | ORAL | Qty: 1

## 2018-11-26 MED FILL — DOK 100 MG CAPSULE: 100 mg | ORAL | Qty: 2

## 2018-11-26 MED FILL — PREDNISONE 20 MG TAB: 20 mg | ORAL | Qty: 1

## 2018-11-26 NOTE — Discharge Summary (Signed)
Discharge Summary by Cathren Harsh, MD at 11/26/18 1125                Author: Cathren Harsh, MD  Service: Internal Medicine  Author Type: Physician       Filed: 11/26/18 1207  Date of Service: 11/26/18 1125  Status: Signed          Editor: Cathren Harsh, MD (Physician)                       Discharge Summary           [name]   MRN: [id number]    DATE OF BIRTH: June 30, 1951     DATE OF ADMISSION: 11/23/2018  8:13 AM     DATE OF DISCHARGE: 11/26/2018    PRIMARY CARE PROVIDER: Berline Lopes, MD        ATTENDING PHYSICIAN: Lizbeth Bark    DISCHARGING PROVIDER: Cathren Harsh, MD     To contact this individual call [phone] and ask the operator to page.  If unavailable ask to be transferred the Adult Hospitalist Department.      CONSULTATIONS: None      PROCEDURES/SURGERIES: * No surgery found *      ADMITTING DIAGNOSES & HOSPITAL COURSE:        ??This is [name] with past medical history significant for left breast cancer status post lumpectomy and radiation treatment in 2014 and on chemotherapy, anxiety, depression, osteoporosis, fibromyalgia, rheumatoid arthritis  and headache, who presented to [facility] Emergency Department with left arm redness, pain, and fever since 04:00 a.m. this morning. ??She stated that she woke up at 04:00 a.m. with left arm redness which spread to her??forearm and upper arm, associated  with pain, 10/10, and fever, and decided to come to [facility] Emergency Department. ??She stated that her son had strep and she completed Augmentin antibiotic for 5 days. ??No nausea, vomiting, left-sided chest pain, palpitation, abdominal pain,  urinary complaint, or lower extremity swelling. ??She has dry cough and mild shortness of breath. ??In the ER when she was seen, her vital signs, blood pressure was 167/103, pulse 104, temperature 97.5, saturation of oxygen 100% on room air. ??Chest  x-ray was done and no pneumonia. ??She received IV ceftriaxone, vancomycin,  and checked her lactic acid and her lactic acid was 1.6, and referred to hospitalist service for further evaluation and admission.        CT Results   (Last 48 hours)          None                    Recent Results (from the past 12 hour(s))     METABOLIC PANEL, BASIC          Collection Time: 11/26/18  4:17 AM         Result  Value  Ref Range            Sodium  138  136 - 145 mmol/L       Potassium  3.9  3.5 - 5.1 mmol/L       Chloride  107  97 - 108 mmol/L       CO2  25  21 - 32 mmol/L       Anion gap  6  5 - 15 mmol/L       Glucose  75  65 - 100 mg/dL       BUN  9  6 - 20 MG/DL       Creatinine  0.50 (L)  0.55 - 1.02 MG/DL       BUN/Creatinine ratio  18  12 - 20         GFR est AA  >60  >60 ml/min/1.24m       GFR est non-AA  >60  >60 ml/min/1.734m           Calcium  8.4 (L)  8.5 - 10.1 MG/DL        Hospital course.      Patient was admitted here with left arm cellulitis.  She had taken Augmentin prior to the admission to the hospital.  Was a started on Zosyn and IV vancomycin.  Her swelling in the left arm has considerably  improved.  I am discharging on doxycycline and Augmentin to finish the course.  Also she noticed that her back was itchy.  There was diffuse papular rash on the back.  I have given 5 days of prednisone for the same.  She denied having penicillin allergy  and so she always does good on Augmentin.  Does not look like IV vancomycin to me.  She says it could be bedbugs from the hospital.  Does not look like bedbugs to me.   Advised her to take probiotic with the antibiotics above.  Follow-up with PCP.      ??   Past medical history and problems reviewed as below    ??Left breast cancer with neuroendocrine features, stage IIA, status post mastectomy in 2014 and completed radiation treatment in 2014, and on chemotherapy. ??   - Chest x-ray showed some calcified nodules in the left hilar region   - CT done reviewed   Dopplers negative for DVT   - Informed pt oncologist Dr. IrGeralyn Flashy Dr. uzKaty Fitch ??    5. ??Anxiety and depression.    ????will continue home medication, Cymbalta 20 mg p.o. b.i.d. although LFT's still not normal d/w pt she is requesting to start on lower dose     Can go back on home dose as the LFTs have resulted back to almost normal now   ??   6. ??History of fibromyalgia, stable. ??????will give her p.r.n. Tylenol.   7. ??History of headaches, stable. ??????will give her p.r.n. Fioricet.   8. ??History of rheumatoid arthritis, stable   9. Abnormal LFT's? -    Repeat lab improved enzymes may f/u out pt with PCP/ oncology    ??           DISCHARGE DIAGNOSES / PLAN:         1.             ADDITIONAL CARE RECOMMENDATIONS:    PLEASE FOLLOW UP        PENDING TEST RESULTS:    At the time of discharge the following test results are still pending:       FOLLOW UP APPOINTMENTS:       Follow-up Information               Follow up With  Specialties  Details  Why  Contact Info              RoBerline LopesMD  Internal Medicine  In 1 week    [phone]  DIET: Regular Diet   Oral Nutritional Supplements:       ACTIVITY: Activity as tolerated      WOUND CARE:       EQUIPMENT needed:          DISCHARGE MEDICATIONS:     Current Discharge Medication List              START taking these medications          Details        predniSONE (DELTASONE) 20 mg tablet  Take 20 mg by mouth daily (with breakfast) for 5 days.   Qty: 5 Tab, Refills:  0               doxycycline (MONODOX) 100 mg capsule  Take 1 Cap by mouth two (2) times a day for 3 days.   Qty: 6 Cap, Refills:  0               amoxicillin-clavulanate (AUGMENTIN) 875-125 mg per tablet  Take 1 Tab by mouth two (2) times a day for 3 days.   Qty: 6 Tab, Refills:  0                     CONTINUE these medications which have NOT CHANGED          Details        oxyCODONE IR (ROXICODONE) 5 mg immediate release tablet  Take 5 mg by mouth every six (6) hours as needed for Pain.               acetaminophen (TYLENOL) 500  mg tablet  Take 500 mg by mouth every six (6) hours as needed for Pain.               butalbital-acetaminophen-caff (FIORICET) 50-300-40 mg per capsule  Take 1 Cap by mouth every four (4) hours as needed for Headache.               DULoxetine (CYMBALTA) 30 mg capsule  Take 30 mg by mouth two (2) times a day.   Refills: 3               zolpidem (AMBIEN) 10 mg tablet  TAKE 1 TABLET BY MOUTH AT BEDTIME   Refills: 3               alendronate (FOSAMAX) 70 mg tablet  Take 1 Tab by mouth every seven (7) days.   Qty: 4 Tab, Refills:  2          Associated Diagnoses: Age-related osteoporosis without current pathological fracture                     STOP taking these medications                  butalbitaL-acetaminop-caf-cod 50-300-40-30 mg cap  Comments:    Reason for Stopping:                                NOTIFY YOUR PHYSICIAN FOR ANY OF THE FOLLOWING:    Fever over 101 degrees for 24 hours.    Chest pain, shortness of breath, fever, chills, nausea, vomiting, diarrhea, change in mentation, falling, weakness, bleeding. Severe pain or pain not relieved by medications.   Or, any other signs or symptoms that you may have questions about.      DISPOSITION:  xx  Home With:     OT    PT    HH    RN                   Long term SNF/Inpatient Rehab       Independent/assisted living          Hospice          Other:           PATIENT CONDITION AT DISCHARGE:       Functional status         Poor        Deconditioned         x  Independent         Cognition        x  Lucid        Forgetful           Dementia         Catheters/lines (plus indication)         Foley        PICC        PEG         x  None         Code status       x  Full code           DNR         PHYSICAL EXAMINATION AT DISCHARGE:   General:          Alert, cooperative, no distress, appears stated age.      HEENT:           Atraumatic, anicteric sclerae, pink conjunctivae                           No oral ulcers, mucosa moist, throat clear, dentition fair   Neck:                Supple, symmetrical   Lungs:             Clear to auscultation bilaterally.  No Wheezing or Rhonchi. No rales.   Chest wall:      No tenderness  No Accessory muscle use.   Heart:              Regular  rhythm,  No  murmur   No edema   Abdomen:        Soft, non-tender. Not distended.  Bowel sounds normal   Extremities:     left arm mild swelling and induration , no redness    Skin:                Not pale.  Not Jaundiced  No rashes    Psych:             Not anxious or agitated.   Neurologic:      Alert, moves all extremities, answers questions appropriately and responds to commands          CHRONIC MEDICAL DIAGNOSES:      Problem List as of 11/26/2018  Date Reviewed:  08/19/18                        Codes  Class  Noted - Resolved             Cellulitis of left arm  ICD-10-CM: L03.114   ICD-9-CM: 682.3    11/23/2018 - Present  VIN III (vulvar intraepithelial neoplasia III)  ICD-10-CM: D07.1   ICD-9-CM: 233.32    03/22/2018 - Present                       Lymphedema of left arm  ICD-10-CM: I89.0   ICD-9-CM: 457.1    08/21/2015 - Present                       Pheochromocytoma  ICD-10-CM: D35.00   ICD-9-CM: 227.0    08/21/2015 - Present                       Arthritis of both knees  ICD-10-CM: M17.0   ICD-9-CM: 716.96    08/21/2015 - Present                       Intractable chronic paroxysmal hemicrania  ICD-10-CM: 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 1/14 nodes positive with a 1 cm met, no ECE. 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 pT1b pN1a Mx.   ??   12/25/2012 - 02/26/2013: S/p TC q 3 weeks x 4 (75 mg/m2; 600 mg/m2)   ??   04/02/2013 - 05/10/2013: S/p  XRT   ??   05/17/2013 - 07/30/2013: Anastrozole, stopped due to joint pains??   08/06/2013 - 09/30/2013: Exemestane stopped due joint pains   07/10/2013: Zometa q 6 months x 3 years, stopped    09/30/2013 - 03/08/2014: Tamoxifen, stopped 03/08/14 due to joint pains   03/23/2014 - 09/18/2014: Letrozole, stopped due to joint pains   10/04/2014 - 12/15/2014: Tamoxifen, stopped due to hot flashes and mood swings.   12/16/2014 - 01/30/2015: Anastrozole, stopped due to headache and dizziness   02/09/2015 started Toremefine   ??                                   Breast cancer, stage 2 ([place])  ICD-10-CM: C50.919   ICD-9-CM: 174.9    10/22/2012 - Present                       RESOLVED: Seroma  ICD-10-CM: [id number]   ICD-9-CM: 998.13    02/20/2013 - 04/15/2013                          Greater than 30  minutes were spent with the patient on counseling and coordination of care      Signed:    Cathren Harsh, MD   11/26/2018   11:25 AM

## 2018-11-26 NOTE — Progress Notes (Signed)
Hospital follow-up PCP transitional care appointment has been scheduled with Dr. Learta Codding for Monday, 12/03/18 at 10:45 a.m.  Pending patient discharge.  Mariane Masters, Care Management Specialist.

## 2018-11-26 NOTE — Progress Notes (Signed)
Pt has new rash to back - already has hydrocortisone cream ordered for L arm so used that on her back per her request & pt states she will show the Dr in the morning

## 2018-11-26 NOTE — Discharge Summary (Signed)
Discharge Summary       [name]  MRN: [id number]   DATE OF BIRTH: 06-08-1951    DATE OF ADMISSION: 11/23/2018  8:13 AM    DATE OF DISCHARGE: 11/26/2018   PRIMARY CARE PROVIDER: Berline Lopes, MD     ATTENDING PHYSICIAN: Lizbeth Bark   DISCHARGING PROVIDER: Cathren Harsh, MD    To contact this individual call [phone] and ask the operator to page.  If unavailable ask to be transferred the Adult Hospitalist Department.    CONSULTATIONS: None    PROCEDURES/SURGERIES: * No surgery found *    ADMITTING DIAGNOSES & HOSPITAL COURSE:      ??This is [name] with past medical history significant for left breast cancer status post lumpectomy and radiation treatment in 2014 and on chemotherapy, anxiety, depression, osteoporosis, fibromyalgia, rheumatoid arthritis and headache, who presented to [facility] Emergency Department with left arm redness, pain, and fever since 04:00 a.m. this morning. ??She stated that she woke up at 04:00 a.m. with left arm redness which spread to her??forearm and upper arm, associated with pain, 10/10, and fever, and decided to come to [facility] Emergency Department. ??She stated that her son had strep and she completed Augmentin antibiotic for 5 days. ??No nausea, vomiting, left-sided chest pain, palpitation, abdominal pain, urinary complaint, or lower extremity swelling. ??She has dry cough and mild shortness of breath. ??In the ER when she was seen, her vital signs, blood pressure was 167/103, pulse 104, temperature 97.5, saturation of oxygen 100% on room air. ??Chest x-ray was done and no pneumonia. ??She received IV ceftriaxone, vancomycin, and checked her lactic acid and her lactic acid was 1.6, and referred to hospitalist service for further evaluation and admission.    CT Results  (Last 48 hours)    None          Recent Results (from the past 12 hour(s))   METABOLIC PANEL, BASIC    Collection Time: 11/26/18  4:17 AM   Result Value Ref Range     Sodium 138 136 - 145 mmol/L    Potassium 3.9 3.5 - 5.1 mmol/L    Chloride 107 97 - 108 mmol/L    CO2 25 21 - 32 mmol/L    Anion gap 6 5 - 15 mmol/L    Glucose 75 65 - 100 mg/dL    BUN 9 6 - 20 MG/DL    Creatinine 0.50 (L) 0.55 - 1.02 MG/DL    BUN/Creatinine ratio 18 12 - 20      GFR est AA >60 >60 ml/min/1.18m    GFR est non-AA >60 >60 ml/min/1.771m   Calcium 8.4 (L) 8.5 - 10.1 MG/DL     Hospital course.    Patient was admitted here with left arm cellulitis.  She had taken Augmentin prior to the admission to the hospital.  Was a started on Zosyn and IV vancomycin.  Her swelling in the left arm has considerably improved.  I am discharging on doxycycline and Augmentin to finish the course.  Also she noticed that her back was itchy.  There was diffuse papular rash on the back.  I have given 5 days of prednisone for the same.  She denied having penicillin allergy and so she always does good on Augmentin.  Does not look like IV vancomycin to me.  She says it could be bedbugs from the hospital.  Does not look like bedbugs to me.  Advised her to take probiotic with  the antibiotics above.  Follow-up with PCP.    ??  Past medical history and problems reviewed as below   ??Left breast cancer with neuroendocrine features, stage IIA, status post mastectomy in 2014 and completed radiation treatment in 2014, and on chemotherapy. ??  - Chest x-ray showed some calcified nodules in the left hilar region  - CT done reviewed  Dopplers negative for DVT  - Informed pt oncologist Dr. Geralyn Flash by Dr. Katy Fitch  ??  5. ??Anxiety and depression.   ????will continue home medication, Cymbalta 20 mg p.o. b.i.d. although LFT's still not normal d/w pt she is requesting to start on lower dose    Can go back on home dose as the LFTs have resulted back to almost normal now  ??  6. ??History of fibromyalgia, stable. ??????will give her p.r.n. Tylenol.  7. ??History of headaches, stable. ??????will give her p.r.n. Fioricet.   8. ??History of rheumatoid arthritis, stable  9. Abnormal LFT's? -   Repeat lab improved enzymes may f/u out pt with PCP/ oncology   ??      DISCHARGE DIAGNOSES / PLAN:      1.       ADDITIONAL CARE RECOMMENDATIONS:   PLEASE FOLLOW UP      PENDING TEST RESULTS:   At the time of discharge the following test results are still pending:     FOLLOW UP APPOINTMENTS:    Follow-up Information     Follow up With Specialties Details Why Contact Info    Berline Lopes, MD Internal Medicine In 1 week  [address]  [place] [address]  [phone]               DIET: Regular Diet  Oral Nutritional Supplements:     ACTIVITY: Activity as tolerated    WOUND CARE:     EQUIPMENT needed:       DISCHARGE MEDICATIONS:  Current Discharge Medication List      START taking these medications    Details   predniSONE (DELTASONE) 20 mg tablet Take 20 mg by mouth daily (with breakfast) for 5 days.  Qty: 5 Tab, Refills: 0      doxycycline (MONODOX) 100 mg capsule Take 1 Cap by mouth two (2) times a day for 3 days.  Qty: 6 Cap, Refills: 0      amoxicillin-clavulanate (AUGMENTIN) 875-125 mg per tablet Take 1 Tab by mouth two (2) times a day for 3 days.  Qty: 6 Tab, Refills: 0         CONTINUE these medications which have NOT CHANGED    Details   oxyCODONE IR (ROXICODONE) 5 mg immediate release tablet Take 5 mg by mouth every six (6) hours as needed for Pain.      acetaminophen (TYLENOL) 500 mg tablet Take 500 mg by mouth every six (6) hours as needed for Pain.      butalbital-acetaminophen-caff (FIORICET) 50-300-40 mg per capsule Take 1 Cap by mouth every four (4) hours as needed for Headache.      DULoxetine (CYMBALTA) 30 mg capsule Take 30 mg by mouth two (2) times a day.  Refills: 3      zolpidem (AMBIEN) 10 mg tablet TAKE 1 TABLET BY MOUTH AT BEDTIME  Refills: 3      alendronate (FOSAMAX) 70 mg tablet Take 1 Tab by mouth every seven (7) days.  Qty: 4 Tab, Refills: 2    Associated Diagnoses: Age-related osteoporosis without current  pathological fracture  STOP taking these medications       butalbitaL-acetaminop-caf-cod 50-300-40-30 mg cap Comments:   Reason for Stopping:                 NOTIFY YOUR PHYSICIAN FOR ANY OF THE FOLLOWING:   Fever over 101 degrees for 24 hours.   Chest pain, shortness of breath, fever, chills, nausea, vomiting, diarrhea, change in mentation, falling, weakness, bleeding. Severe pain or pain not relieved by medications.  Or, any other signs or symptoms that you may have questions about.    DISPOSITION:   xx Home With:   OT  PT  HH  RN       Long term SNF/Inpatient Rehab    Independent/assisted living    Hospice    Other:       PATIENT CONDITION AT DISCHARGE:     Functional status    Poor     Deconditioned    x Independent      Cognition    x Lucid     Forgetful     Dementia      Catheters/lines (plus indication)    Foley     PICC     PEG    x None      Code status   x Full code     DNR      PHYSICAL EXAMINATION AT DISCHARGE:  General:          Alert, cooperative, no distress, appears stated age.     HEENT:           Atraumatic, anicteric sclerae, pink conjunctivae                          No oral ulcers, mucosa moist, throat clear, dentition fair  Neck:               Supple, symmetrical  Lungs:             Clear to auscultation bilaterally.  No Wheezing or Rhonchi. No rales.  Chest wall:      No tenderness  No Accessory muscle use.  Heart:              Regular  rhythm,  No  murmur   No edema  Abdomen:        Soft, non-tender. Not distended.  Bowel sounds normal  Extremities:     left arm mild swelling and induration , no redness   Skin:                Not pale.  Not Jaundiced  No rashes   Psych:             Not anxious or agitated.  Neurologic:      Alert, moves all extremities, answers questions appropriately and responds to commands       CHRONIC MEDICAL DIAGNOSES:  Problem List as of 11/26/2018 Date Reviewed: 08/27/18          Codes Class Noted - Resolved    Cellulitis of left arm ICD-10-CM: L03.114   ICD-9-CM: 682.3  11/23/2018 - Present        VIN III (vulvar intraepithelial neoplasia III) ICD-10-CM: D07.1  ICD-9-CM: 233.32  03/22/2018 - Present        Lymphedema of left arm ICD-10-CM: I89.0  ICD-9-CM: 457.1  08/21/2015 - Present        Pheochromocytoma ICD-10-CM: D35.00  ICD-9-CM: 227.0  08/21/2015 - Present  Arthritis of both knees ICD-10-CM: M17.0  ICD-9-CM: 716.96  08/21/2015 - Present        Intractable chronic paroxysmal hemicrania ICD-10-CM: G44.041  ICD-9-CM: 339.04  08/21/2015 - Present        Malignant neoplasm of lower-inner quadrant of left female breast [facility]) ICD-10-CM: C50.312  ICD-9-CM: 174.3  07/24/2015 - Present    Overview Addendum 07/24/2015  9:22 AM by Gala Romney, RN     11/28/2012: LEFT mastectomy and ALND for 0.5cm gr2 IDC with neuroendocrine features. 1/14 nodes positive with a 1 cm met, no ECE. 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  ??  12/25/2012 - 02/26/2013: S/p TC q 3 weeks x 4 (75 mg/m2; 600 mg/m2)  ??  04/02/2013 - 05/10/2013: S/p XRT  ??  05/17/2013 - 07/30/2013: Anastrozole, stopped due to joint pains??  08/06/2013 - 09/30/2013: Exemestane stopped due joint pains  07/10/2013: Zometa q 6 months x 3 years, stopped   09/30/2013 - 03/08/2014: Tamoxifen, stopped 03/08/14 due to joint pains  03/23/2014 - 09/18/2014: Letrozole, stopped due to joint pains  10/04/2014 - 12/15/2014: Tamoxifen, stopped due to hot flashes and mood swings.  12/16/2014 - 01/30/2015: Anastrozole, stopped due to headache and dizziness  02/09/2015 started Toremefine  ??             Breast cancer, stage 2 ([place]) ICD-10-CM: C50.919  ICD-9-CM: 174.9  10/22/2012 - Present        RESOLVED: Seroma ICD-10-CM: [id number]  ICD-9-CM: 998.13  02/20/2013 - 04/15/2013              Greater than 30  minutes were spent with the patient on counseling and coordination of care    Signed:   Cathren Harsh, MD  11/26/2018  11:25 AM

## 2018-11-27 NOTE — Telephone Encounter (Signed)
11/27/2018 5:56 PM: Called patient and advised that [name] would like for her to come in for an office appointment the week of March 16th. Patient was agreeable to this and stated that she would like to see someone at [facility] lymphedema clinic before then. Advised patient that a referral to [facility] will be entered and RN will call them to tomorrow to get the patient scheduled. Also advised that the patient will receive a call back tomorrow about getting scheduled with this office the week of March 16th. Patient verbalized understanding and denied further questions or concerns.

## 2018-11-27 NOTE — Telephone Encounter (Signed)
Patient just discharge from [facility]  Patient is looking for Dr. Geoffry Paradise to refer patient to a closer Lymphedema Clinic than [place] due to she is weak. [place] currenlty lives in [place].  She really wants advises on what she should be doing.  She doesn't really trust the hospitalist for her as much as she seek advice from [name].     Cb [phone]

## 2018-11-28 ENCOUNTER — Encounter

## 2018-11-28 LAB — CULTURE, BLOOD, PAIRED
Culture result:: NO GROWTH
Culture: NO GROWTH

## 2018-11-28 NOTE — Telephone Encounter (Signed)
Patient called back asking about when she could come in. I told her we had her down for Tuesday, December 18, 2018 2:30 PM. She said that she needed something earlier so I put her down for 8:30 am on the same day. She then asked about when could she get in to see the lympedema doctor. I told her that we where waiting to her back from [facility] and then we would give her a call. But that it was not on [place] because we are waiting on them so we would get back to her a soon as we could.

## 2018-12-17 ENCOUNTER — Inpatient Hospital Stay: Admit: 2018-12-17 | Payer: MEDICARE | Primary: Family Medicine

## 2018-12-17 DIAGNOSIS — I972 Postmastectomy lymphedema syndrome: Secondary | ICD-10-CM

## 2018-12-17 NOTE — Progress Notes (Signed)
 Progress Notes by Marguerite Christians, Jon HERO at 12/17/18 0830                Author: Marguerite Christians, Jon HERO  Service: --  Author Type: Physical Therapist       Filed: 12/18/18 1504  Date of Service: 12/17/18 0830  Status: Addendum          Editor: Marguerite Christians Jon HERO (Physical Therapist)          Related Notes: Original Note by Marguerite Christians Jon HERO (Physical Therapist) filed at 12/17/18  1458                                                            [facility]   Physical Therapy Lymphedema Clinic   [address]   [address] [place], [address]   [place], [place]  [address]      INITIAL EVALUATION      NAME: Emily [name] AGE : 68 y.o.  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  Patient has been hospitalized twice for cellulitis.  She wears compression daily and has day  and night compression products.  She also has a Flexitouch that she is not currently using.  Patient is concerned about recurrent infections and trying to prevent them in the future,  She reports having fragile skin that she has cuts and injuries frequently,  but she keeps them clean and covered.  Patient also reports significant chest and shoulder tightness with a history of axillary web syndrome.   Her decreased range of motion results in her having to perform self care activities in a modified manner due  to not being able to easily reach her back or the back of her head.   [facility] Lymphedema Assessment Scale: Score of 15 out of 20.   Past Medical History:      Past Medical History:        Diagnosis  Date         ?  Anxiety       ?  Arthritis       ?  Basal cell carcinoma       ?  Cancer [facility])            left breast  cancer         ?  Chronic tension headaches       ?  Concussion  2011     ?  Headache(784.0)  OCCAS         ?  Hx of radiation therapy            COMPLETED IN 2014         ?  Ill-defined condition            cellulitis L UE         ?  Lymphedema of arm            left         ?  Nausea & vomiting       ?  Other and unspecified symptoms and signs involving general sensations and perceptions            HIVES RECENTLY- WAS IN ER Feb 19, 2013         ?  Other ill-defined conditions(799.89)            PHEOCHROMOCYTOMA         ?  S/P chemotherapy, time since greater than 12 weeks            COMPLETED CHEMOTHERAPY IN JUNE 2014         ?  SCCA (squamous cell carcinoma) of skin            Past Surgical History:         Procedure  Laterality  Date          ?  ABDOMEN SURGERY PROC UNLISTED              hernia mesh repair          ?  ABDOMEN SURGERY PROC UNLISTED              right adenal removed          ?  BREAST SURGERY PROCEDURE UNLISTED    '91          breast implants; replaced bilat implants 2011          ?  HX APPENDECTOMY         ?  HX BREAST AUGMENTATION  Left  04/22/2014          REMOVAL OF LEFT BREAST IMPLANT, WASHOUT AND CLOSURE OF LEFT BREAST WOUND performed by Garnette CHRISTELLA Door, MD at [facility] MAIN OR          ?  HX BREAST RECONSTRUCTION    11/28/2012          BREAST RECONSTRUCTION /C  INSERTION EXPANDER & ALLODERM performed by Garnette CHRISTELLA Door, MD at [facility] AMBULATORY OR          ?  HX BREAST RECONSTRUCTION  Bilateral  09/16/2013          REMOVAL AND REPLACEMENT LEFT BREAST IMPLANT  TISSUE EXPANDERS WITH SUBTOTAL CAPSULECTOMY, RIGHT BREAST IMPLANT REMOVAL AND REPLACEMENT FOR SYMMETRY/FAT  GRAFTING TO BILATERAL BREASTS performed by Garnette CHRISTELLA Door, MD at [facility] MAIN OR          ?  HX GYN              c-section          ?  HX HEENT              T&A          ?  HX HEENT              right adrenal removed          ?  HX MASTECTOMY  Left       ?  HX MODIFIED RADICAL MASTECTOMY    11/28/2012          LEFT BREAST MODIFIED RADICAL MASTECTOMY WITH AXILLARY DISSECTION W/ RECONSTRUCTION LEFT BREAST WITH TISSUE EXPANDER AND ALLODERM;PORTACATH INSERTION  performed by James Pellicane Jr V, MD at [facility] AMBULATORY OR          ?  HX OOPHORECTOMY              right          ?  HX ORTHOPAEDIC              bilateral shoulder arthroscopy          ?  HX VASCULAR ACCESS              PORT, THEN REMOVED        Current Medications:       Current Outpatient Medications        Medication  Sig         ?  oxyCODONE  IR (ROXICODONE ) 5 mg immediate release tablet  Take 5 mg by mouth every six (6) hours as needed for Pain.     ?  acetaminophen  (TYLENOL ) 500 mg tablet  Take 500 mg by mouth every six (6) hours as needed for Pain.         ?  butalbital-acetaminophen -caff (FIORICET) 50-300-40 mg per capsule  Take 1 Cap by mouth every four (4) hours as needed for Headache.         ?  alendronate (FOSAMAX) 70 mg tablet  Take 1 Tab by mouth every seven (7) days.     ?  DULoxetine  (CYMBALTA ) 30 mg capsule  Take 30 mg by mouth two (2) times a day.         ?  zolpidem  (AMBIEN ) 10 mg tablet  TAKE 1 TABLET BY MOUTH AT BEDTIME          No current facility-administered medications for this encounter.         Allergies:      Allergies        Allergen  Reactions         ?  Aspirin  Unknown (comments)             Gi bleeding         ?  Bacitracin  Hives and Swelling     ?  Compazine [Prochlorperazine]  Rash     ?  Nsaids (Non-Steroidal Anti-Inflammatory Drug)  Swelling             Throat swells         Prior Level of Function/Social/Work History/  Personal factors and/or comorbidities impacting plan of care: Patient works part time at a [employer], has a 20 year old son with autism, significant other is currently in the hospital with  lung complications s/p cancer treatments    Living Situation: Resides in townhouse with significant other      Trainable Caregiver?: Not needed    Self-care/ADLs: Modified independent      Mobility: Independent    Sleeping Arrangement:  Sleeps in bed at night, but restless sleep    Adaptive Equipment Owned: walker       Previous Therapy:  Patient has been treated with complete decongestive therapy at [facility]    Compression/Lymphedema Equipment:  Juxo expert silver class 1 custom flat knit compression sleeve with silicone band, hand pieces including  gloves and gauntlets, [employer] Tribute custom arm garment ordered in 2019, [employer]  home vasopneumatic device        SUBJECTIVE:  I am wearing my garments but I really need a new sleeve and I really would like for it to be longer.  This is just too short  for me.  I am also having a lot of tightness - I have axillary web cording and I think it has gotten worse.  Patti and I worked on it in the past.        Patients goals for therapy: To get new compression garments and decrease the tightness in my arm and shoulder        OBJECTIVE DATA SUMMARY:     EXAMINATION/PRESENTATION/DECISION MAKING:    Pain:  Patient reports intermittent left arm pain that she describes as heaviness  in her left arm.  She reports her pain level was much higher with the cellulitis infection.      Pain Scale 1: Numeric (0 - 10)        Pain Intensity 1: 6        Pain Location 1: Arm, Hand, Shoulder        Pain Orientation 1: Left                       Self-Care and ADLs:      Grooming: Modified independent    Bathing: Modified independent       UB Dressing: Modified independent    LB Dressing: Modified independent          Don/Doff Shoes/Socks: Modified independent    Toileting: Modified  independent          Other:  N/A          Skin and Tissue Assessment:   Dermal Status:      [x]     Intact  []    Dry        []    Tenuous  []   Flaky        []    Wound/lesion present  []    Scars     []    Dermatitis       Texture/Consistency:      [x]    Boggy- left arm  []    Pitting Edema        []    Brawny  []    Combination     []    Fibrotic/Woody       Pigmentation/Color Change:      []    Normal  []    Hemosiderin     []    Red  []    Erythematous     []    Hyperpigmented  []    Hyperlipodermatosclerosis     Anomalies:      []    Lymphorrhea  []    Vesicles        []    Petechiae  []    Warty Vercusis     []    Bullae  []    Papilloma     Circulatory:      []    ABI  []    Varicosities:        []    Pulses  [x]    Vascular studies ruled out DVT in past- for left UE with last hospitalization         []    DVT History       Nails:   [x]    Normal   []    Fungus   Stemmers Sign: Negative   Height:  Height: 5' 6 (167.6 cm)  Weight:   Weight:  53.5 kg (118 lb)    BMI:  BMI (calculated): 19.1   (36 or greater: adversely affecting lymphedema)   Wound/Ulcer:  N/A      Wound Pain:    N/A   Volumetric Measurements:       Right:  1,554.49mL  Left:  1,815.41mL     % Difference: 16.77% left larger than right  Dominance: Right     (See scanned graph)   Mild hand and finger swelling is noted today.   Range of Motion: Shoulder active range of motion is assessed bilaterally  as follows:  Flexion: left 0-95 degrees, right 0-95 degrees; abduction: left -0-90 degrees, right 0-110 degrees; external rotation:  Left 0-70 degrees, right 0-70 degrees; internal rotation: left 0-75 degrees, right 0-60 degrees; extension: left -0-25  degrees, right- 0-30 degrees.   Strength: Grossly within functional limits for all activities completed in  the clinic today with no strength limitations noted.   Palpation:  Axillary web cording is palpable in the left axilla and medial posterior aspect of left upper arm.  Lateral and posterior trunk  were not assessed today.    Sensation:     [x]  Intact     []  Impaired   Mobility:        Bed/Chair Mobility:  Independent    Transfers:  Independent       Sitting Balance:  good  Standing Balance:  good     Gait:  Independent  without assistive device for community distances  Wheelchair Mobility:  N/A     Endurance:  Good  Stairs:  Independent            Safety:     Patient is alert and oriented:  yes     Safety awareness:  good     Fall Risk?:  Low     Patient given written fall prevention handout: Yes     Precautions:  Standard lymphedema precautions to include avoiding blood pressure  readings, injections and IVs or other procedures/acts that could lead to broken skin on affected area, and avoiding excessive heat, resistive activity or altitude without compression garment       Based on the above components, the patient evaluation is determined to be of the following complexity level: LOW       Evaluation Time: 0830-0900  30 minutes        TREATMENT PROVIDED:     1.  Treatment description:  Manual Therapy: Patient instructed in skin care principles and anatomy of the lymphatic system.  Therapist able to  review skin care for the left upper extremity and the use of low pH lotions.  Patient is using Cerave daily.  Reviewed care for skin injuries and patient is diligent about covering any cuts or injuries she has. Patient is knowledgeable of the signs and  symptoms of cellulitis infections.  Patient is using compression daily.  Assessed her night Tribute garment with good fit and garment is currently in good condition (was ordered in 2019).  Disucssed that her outer compression jacket for the Alphonzo can  be replaced and ordered without replacing the foam garment itself.  Patient reports no problems with the jackets currently.  Assessed her Juzo silver expert flat-knit custom arm sleeve which is very short on her upper arm.  Patient is requesting to order  a new custom garment that is longer and therapist agreed that this is needed.  Educated  patient in the 30 day remake period for the garment and that  we adjust her lengths for a longer garment and if it still too short that we can request a remake with  new measurements.  Patient was appreciative of this option.  Therapist proceeded to measure patient for custom Juzo Expert silver flat-knit compression sleeve with silicone band and in compression class 1.  Patient reports she has useable gloves and gauntlets  and declines ordering a new one currently.  Patient has a Nature conservation officer and therapist recommend she resume using it as she has not used it for a long time.  Discussed trying to use the product 2-3 days a week currently to start with.  Patient is agreeable  that this is something she needs to begin to use again.  Patient also presents with left axillary web cording.  Therapist initiated manual soft tissue techniques today with patient in supine with left arm supported at 90 degrees abduction on a pillow.   Therapist performed skin tractioning techniques with dicem, space correction techniques and twisty mobilization to address cording in the axilla and the arm.  Patient reports a noticeable decrease in the tightness in these areas s/p treatment today.   Will continue next session.     Treatment time:  0900-1000  Minutes: 60      Total treatment time 90 minutes     ASSESSMENT:     Emily [name] is a 68 y.o. female who presents with left upper extremity  stage 2 lymphedema secondary to a history of left breast cancer with mastectomy and reconstruction. Patient has a history of recurrent  cellulitis infections with the last infection having been in February.  Patient also present with significant range of motion deficits at bilateral shoulders and axillary web cording is present on the left side.  Her condition is complicated by arthritis,  anxiety, and chronic headaches.   Patient will benefit from complete decongestive therapy (CDT)  including manual lymphatic drainage (MLD) technique, short-stretch  textile bandages/compression system to decongest limb, and  kinesiotaping as appropriate.  Patient  will receive instruction in proper skin care to recognize signs/symptoms of and prevent infection, therapeutic exercise, and self-MLD for independent home program and restorative lymphatic performance.      This care is medically necessary due to the infection risk with lymphedema and to improve functional activities.  CDT is necessary  to resolve swelling to allow patient to return to wearing normal clothes/footwear and prevent worsening of symptoms, such as venous stasis ulcerations, infections, or hospitalizations.  Pa tient will be independent with home program strategies to allow improved ADL ability and mobility and to allow patient to return  to greatest functional independence.      Rehabilitation potential is considered to be Good.  Factors which  may influence rehabilitation potential include compliance with home exercise program and patient is motivated to improve her status and to try to prevent future infections .  Patient will benefit from 3-6 physical therapy visits over 10-12  weeks to optimize improvement in these areas.        PLAN OF CARE:     Recommendations and Planned Interventions:   Manual lymph drainage/complete decongestive therapy   Compression garment fitting/provision   Lymphedema therapeutic exercise   AROM/PROM/Strength/Coordination   Self-care training   Functional mobility training   Education in skin care and lymphedema precautions   Self-MLD education per home program            GOALS   Short term goals   Time frame: 01/31/2019  1.  Patient will demonstrate independence in lymphedema home program of therapeutic exercises to improve circulation and decongest  limb to improve ADLs.   2.  Patient will be educated in self axillary web cording mobilizations including the use of dicem to decrease her sensation of tightness and to improve her mobility over time.   3.  Patient will be  fitted for an appropriate custom flat-knit compression garment for long term management and to prevent worsening of swelling over time.      Long term goals   Time frame: 03/14/2019   1.  Pt will show improvement in the [facility] Lymphedema Assessment Scale  by decreasing the score to 12 and thus allow improvement in patient's quality  of life.   2.  Patient will be independent with don/doff of compression system and use in order to prevent reaccumulation of fluid at discharge.   3.  Pt will be independent in self-MLD and show stable limb volumes showing decongestion and pt. ready for transition to independent restorative phase of lymphedema therapy.        Patient has participated in goal setting and agrees to work toward plan of care.   Patient was instructed to call if questions or concerns arise.      Thank you for this referral.    Jon Pao, MSPT, CLT-LANA            TREATMENT PLAN EFFECTIVE DATES:     12/17/2018 TO 03/14/2019   I have read the above plan of care for [facility].  I certify the above prescribed services are required by this patient and are medically necessary.  The above plan of care has been developed  in conjunction with the lymphedema/physical therapist.          Physician Signature: ____________________________________Date: ______________                                        Earnestine Cramp, NP

## 2018-12-17 NOTE — Progress Notes (Addendum)
[facility]  Physical Therapy Lymphedema Clinic  [place], [place]  [address]    INITIAL EVALUATION    NAME: Emily [name] AGE: 68 y.o.  GENDER: female  DATE: 12/17/2018  REFERRING PHYSICIAN: Henriette Combs, NP  HISTORY AND BACKGROUND:   Primary Diagnosis:  ?? 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 Cellulitis of left upper limb (L03.114)  ?? Swelling not relieved by elevation (R60.9)  ?? Unspecified lack of coordination (R27.9)  ?? 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 History of modified radical mastectomy, left (Z90.12)  Date of Onset: 11/28/2018  Present Symptoms and Functional Limitations: Patient presents with chronic left upper extremity lymphedema s/p left breast mastectomy with history of radiation and breast reconstruction in 2013 and 2014.Marland Kitchen  Patient has a history of recurrent cellulitis infections with last infection in February.  Patient has been hospitalized twice for cellulitis.  She wears compression daily and has day and night compression products.  She also has a Flexitouch that she is not currently using.  Patient is concerned about recurrent infections and trying to prevent them in the future,  She reports having fragile skin that she has cuts and injuries frequently, but she keeps them clean and covered.  Patient also reports significant chest and shoulder tightness with a history of axillary web syndrome.  Her decreased range of motion results in her having to perform self care activities in a modified manner due to not being able to easily reach her back or the back of her head.  [facility] Lymphedema Assessment Scale: Score of 15 out of 20.  Past Medical History:   Past Medical History:   Diagnosis Date   ??? Anxiety    ??? Arthritis    ??? Basal cell carcinoma    ??? Cancer ([place])     left breast  cancer   ??? Chronic tension headaches    ??? Concussion 2011    ??? Headache(784.0)     OCCAS   ??? Hx of radiation therapy     COMPLETED IN 2014   ??? Ill-defined condition     cellulitis L UE   ??? Lymphedema of arm     left   ??? Nausea & vomiting    ??? Other and unspecified symptoms and signs involving general sensations and perceptions     HIVES RECENTLY- WAS IN ER Feb 19, 2013   ??? Other ill-defined conditions(799.89)     PHEOCHROMOCYTOMA   ??? S/P chemotherapy, time since greater than 12 weeks     COMPLETED CHEMOTHERAPY IN JUNE 2014   ??? SCCA (squamous cell carcinoma) of skin      Past Surgical History:   Procedure Laterality Date   ??? ABDOMEN SURGERY PROC UNLISTED      hernia mesh repair   ??? ABDOMEN SURGERY PROC UNLISTED      right adenal removed   ??? BREAST SURGERY PROCEDURE UNLISTED  '91    breast implants; replaced bilat implants 2011   ??? HX APPENDECTOMY     ??? HX BREAST AUGMENTATION Left 04/22/2014    REMOVAL OF LEFT BREAST IMPLANT, WASHOUT AND CLOSURE OF LEFT BREAST WOUND performed by Sharlotte Alamo, MD at [place]   ??? HX BREAST RECONSTRUCTION  11/28/2012    BREAST RECONSTRUCTION /C  INSERTION EXPANDER & ALLODERM performed by Sharlotte Alamo, MD at [place]   ??? HX  BREAST RECONSTRUCTION Bilateral 09/16/2013    REMOVAL AND REPLACEMENT LEFT BREAST IMPLANT TISSUE EXPANDERS WITH SUBTOTAL CAPSULECTOMY, RIGHT BREAST IMPLANT REMOVAL AND REPLACEMENT FOR SYMMETRY/FAT GRAFTING TO BILATERAL BREASTS performed by Sharlotte Alamo, MD at [place]   ??? HX GYN      c-section   ??? HX HEENT      T&A   ??? HX HEENT      right adrenal removed   ??? HX MASTECTOMY Left    ??? HX MODIFIED RADICAL MASTECTOMY  11/28/2012    LEFT BREAST MODIFIED RADICAL MASTECTOMY WITH AXILLARY DISSECTION W/ RECONSTRUCTION LEFT BREAST WITH TISSUE EXPANDER AND ALLODERM;PORTACATH INSERTION performed by Ty Hilts, MD at [place]   ??? HX OOPHORECTOMY      right   ??? HX ORTHOPAEDIC      bilateral shoulder arthroscopy   ??? HX VASCULAR ACCESS      PORT, THEN REMOVED     Current Medications:     Current Outpatient Medications   Medication Sig   ??? oxyCODONE IR (ROXICODONE) 5 mg immediate release tablet Take 5 mg by mouth every six (6) hours as needed for Pain.   ??? acetaminophen (TYLENOL) 500 mg tablet Take 500 mg by mouth every six (6) hours as needed for Pain.   ??? butalbital-acetaminophen-caff (FIORICET) 50-300-40 mg per capsule Take 1 Cap by mouth every four (4) hours as needed for Headache.   ??? alendronate (FOSAMAX) 70 mg tablet Take 1 Tab by mouth every seven (7) days.   ??? DULoxetine (CYMBALTA) 30 mg capsule Take 30 mg by mouth two (2) times a day.   ??? zolpidem (AMBIEN) 10 mg tablet TAKE 1 TABLET BY MOUTH AT BEDTIME     No current facility-administered medications for this encounter.      Allergies:   Allergies   Allergen Reactions   ??? Aspirin Unknown (comments)     Gi bleeding   ??? Bacitracin Hives and Swelling   ??? Compazine [Prochlorperazine] Rash   ??? Nsaids (Non-Steroidal Anti-Inflammatory Drug) Swelling     Throat swells      Prior Level of Function/Social/Work History/Personal factors and/or comorbidities impacting plan of care: Patient works part time at a [employer], has a 30 year old son with autism, significant other is currently in the hospital with lung complications s/p cancer treatments   Living Situation: Resides in townhouse with significant other     Trainable Caregiver?: Not needed   Self-care/ADLs: Modified independent     Mobility: Independent   Sleeping Arrangement:  Sleeps in bed at night, but restless sleep   Adaptive Equipment Owned: walker     Previous Therapy:  Patient has been treated with complete decongestive [facility]   Compression/Lymphedema Equipment:  Juxo expert silver class 1 custom flat knit compression sleeve with silicone band, hand pieces including gloves and gauntlets, Solaris Tribute custom arm garment ordered in 2019, Flexitouch home vasopneumatic device    SUBJECTIVE:  I am wearing my garments but I really need a new sleeve and I  really would like for it to be longer.  This is just too short for me.  I am also having a lot of tightness - I have axillary web cording and I think it has gotten worse.  Precious Bard and I worked on it in the past.     Patient???s goals for therapy: To get new compression garments and decrease the tightness in my arm and shoulder  OBJECTIVE DATA SUMMARY:   EXAMINATION/PRESENTATION/DECISION MAKING:   Pain:  Patient reports intermittent left arm pain that she describes as heaviness in her left arm.  She reports her pain level was much higher with the cellulitis infection.  Pain Scale 1: Numeric (0 - 10)     Pain Intensity 1: 6     Pain Location 1: Arm, Hand, Shoulder     Pain Orientation 1: Left             Self-Care and ADLs:  Grooming: Modified independent  Bathing: Modified independent    UB Dressing: Modified independent  LB Dressing: Modified independent    Don/Doff Shoes/Socks: Modified independent  Toileting: Modified independent    Other:  N/A      Skin and Tissue Assessment:  Dermal Status:  [x]    Intact []   Dry   []   Tenuous []  Flaky   []   Wound/lesion present []   Scars   []   Dermatitis    Texture/Consistency:  [x]   Boggy- left arm []   Pitting Edema   []   Brawny []   Combination   []   Fibrotic/Woody    Pigmentation/Color Change:  []   Normal []   Hemosiderin   []   Red []   Erythematous   []   Hyperpigmented []   Hyperlipodermatosclerosis   Anomalies:  []   Lymphorrhea []   Vesicles   []   Petechiae []   Warty Vercusis   []   Bullae []   Papilloma   Circulatory:  []   ABI []   Varicosities:   []   Pulses [x]   Vascular studies ruled out DVT in past- for left UE with last hospitalization    []   DVT History    Nails:  [x]    Normal  []    Fungus  Stemmers Sign: Negative  Height:  Height: 5\' 6"  (167.6 cm)  Weight:  Weight: 53.5 kg (118 lb)   BMI:  BMI (calculated): 19.1  (36 or greater: adversely affecting lymphedema)  Wound/Ulcer:  N/A      Wound Pain:   N/A  Volumetric Measurements:   Right:  1,554.28mL Left:  1,815.49mL    % Difference: 16.77% left larger than right Dominance: Right   (See scanned graph)  Mild hand and finger swelling is noted today.  Range of Motion: Shoulder active range of motion is assessed bilaterally as follows:  Flexion: left 0-95 degrees, right 0-95 degrees; abduction: left -0-90 degrees, right 0-110 degrees; external rotation:  Left 0-70 degrees, right 0-70 degrees; internal rotation: left 0-75 degrees, right 0-60 degrees; extension: left -0-25 degrees, right- 0-30 degrees.  Strength: Grossly within functional limits for all activities completed in the clinic today with no strength limitations noted.  Palpation:  Axillary web cording is palpable in the left axilla and medial posterior aspect of left upper arm.  Lateral and posterior trunk were not assessed today.  Sensation:    [x]  Intact    []  Impaired  Mobility:    Bed/Chair Mobility:  Independent  Transfers:  Independent    Sitting Balance:  good Standing Balance:  good   Gait:  Independent without assistive device for community distances Wheelchair Mobility:  N/A   Endurance:  Good Stairs:  Independent       Safety:  Patient is alert and oriented:  yes   Safety awareness:  good   Fall Risk?:  Low   Patient given written fall prevention handout: Yes   Precautions:  Standard lymphedema precautions to include avoiding blood pressure readings, injections and IVs or other procedures/acts that  could lead to broken skin on affected area, and avoiding excessive heat, resistive activity or altitude without compression garment     Based on the above components, the patient evaluation is determined to be of the following complexity level: LOW     Evaluation Time: 0830-0900  30 minutes    TREATMENT PROVIDED:   1.  Treatment description:  Manual Therapy: Patient instructed in skin care principles and anatomy of the lymphatic system.  Therapist able to review skin care for the left upper extremity and the use of low pH  lotions.  Patient is using Cerave daily.  Reviewed care for skin injuries and patient is diligent about covering any cuts or injuries she has. Patient is knowledgeable of the signs and symptoms of cellulitis infections.  Patient is using compression daily.  Assessed her night Tribute garment with good fit and garment is currently in good condition (was ordered in 2019).  Disucssed that her outer compression jacket for the Lelan Pons can be replaced and ordered without replacing the foam garment itself.  Patient reports no problems with the jackets currently.  Assessed her Juzo silver expert flat-knit custom arm sleeve which is very short on her upper arm.  Patient is requesting to order a new custom garment that is longer and therapist agreed that this is needed.  Educated patient in the 30 day remake period for the garment and that we adjust her lengths for a longer garment and if it still too short that we can request a remake with new measurements.  Patient was appreciative of this option.  Therapist proceeded to measure patient for custom Juzo Expert silver flat-knit compression sleeve with silicone band and in compression class 1.  Patient reports she has useable gloves and gauntlets and declines ordering a new one currently.  Patient has a Cytogeneticist and therapist recommend she resume using it as she has not used it for a long time.  Discussed trying to use the product 2-3 days a week currently to start with.  Patient is agreeable that this is something she needs to begin to use again.  Patient also presents with left axillary web cording.  Therapist initiated manual soft tissue techniques today with patient in supine with left arm supported at 90 degrees abduction on a pillow.  Therapist performed skin tractioning techniques with dicem, space correction techniques and twisty mobilization to address cording in the axilla and the arm.  Patient reports a noticeable decrease in the  tightness in these areas s/p treatment today.  Will continue next session.    Treatment time:  0900-1000  Minutes: 60    Total treatment time 90 minutes  ASSESSMENT:   Emily [name] is a 68 y.o. female who presents with left upper extremity stage 2 lymphedema secondary to a history of left breast cancer with mastectomy and reconstruction. Patient has a history of recurrent cellulitis infections with the last infection having been in February.  Patient also present with significant range of motion deficits at bilateral shoulders and axillary web cording is present on the left side.  Her condition is complicated by arthritis, anxiety, and chronic headaches.   Patient will benefit from complete decongestive therapy (CDT) including manual lymphatic drainage (MLD) technique, short-stretch textile bandages/compression system to decongest limb, and kinesiotaping as appropriate.  Patient will receive instruction in proper skin care to recognize signs/symptoms of and prevent infection, therapeutic exercise, and self-MLD for independent home program and restorative lymphatic performance.    This care is medically necessary  due to the infection risk with lymphedema and to improve functional activities.  CDT is necessary to resolve swelling to allow patient to return to wearing normal clothes/footwear and prevent worsening of symptoms, such as venous stasis ulcerations, infections, or hospitalizations.  Patient will be independent with home program strategies to allow improved ADL ability and mobility and to allow patient to return to greatest functional independence.    Rehabilitation potential is considered to be Good.  Factors which may influence rehabilitation potential include compliance with home exercise program and patient is motivated to improve her status and to try to prevent future infections.  Patient will benefit from 3-6 physical therapy visits over 10-12 weeks to optimize improvement in these areas.     PLAN OF CARE:   Recommendations and Planned Interventions:  Manual lymph drainage/complete decongestive therapy  Compression garment fitting/provision  Lymphedema therapeutic exercise  AROM/PROM/Strength/Coordination  Self-care training  Functional mobility training  Education in skin care and lymphedema precautions  Self-MLD education per home program       GOALS  Short term goals  Time frame: 01/31/2019    1.  Patient will demonstrate independence in lymphedema home program of therapeutic exercises to improve circulation and decongest limb to improve ADLs.  2.  Patient will be educated in self axillary web cording mobilizations including the use of dicem to decrease her sensation of tightness and to improve her mobility over time.  3.  Patient will be fitted for an appropriate custom flat-knit compression garment for long term management and to prevent worsening of swelling over time.    Long term goals  Time frame: 03/14/2019  1.  Pt will show improvement in the [facility] Lymphedema Assessment Scale by decreasing the score to 12 and thus allow improvement in patient's quality of life.  2.  Patient will be independent with don/doff of compression system and use in order to prevent reaccumulation of fluid at discharge.  3.  Pt will be independent in self-MLD and show stable limb volumes showing decongestion and pt. ready for transition to independent restorative phase of lymphedema therapy.     Patient has participated in goal setting and agrees to work toward plan of care.  Patient was instructed to call if questions or concerns arise.    Thank you for this referral.   Rolan Bucco, MSPT, CLT-LANA       TREATMENT PLAN EFFECTIVE DATES:   12/17/2018 TO 03/14/2019  I have read the above plan of care for [facility].  I certify the above prescribed services are required by this patient and are medically necessary.  The above plan of care has been developed in conjunction with  the lymphedema/physical therapist.       Physician Signature: ____________________________________Date:______________                                       Henriette Combs, NP

## 2018-12-18 ENCOUNTER — Encounter: Attending: Specialist | Primary: Family Medicine

## 2018-12-18 ENCOUNTER — Encounter: Payer: MEDICARE | Primary: Family Medicine

## 2018-12-20 ENCOUNTER — Encounter: Attending: Specialist | Primary: Family Medicine

## 2019-01-15 ENCOUNTER — Encounter

## 2019-01-15 MED ORDER — ALENDRONATE 70 MG TAB
70 mg | ORAL_TABLET | ORAL | 0 refills | Status: DC
Start: 2019-01-15 — End: 2019-01-21

## 2019-01-16 NOTE — Telephone Encounter (Signed)
Lymphedema Physical Therapy phone call note 01/16/2019    In-person therapy visits for this out-patient have been placed on temporary hold until on or after 02/26/19 in compliance with organizational directives and CDC recommendations related to the current [place] pandemic.  Contact with this patient by the treating therapist may be made via telephonic e-visits and/or telehealth visits during this time, if applicable.     Therapist spoke with patient this am to discuss her upcoming scheduled appointment for 01/22/2019.  Patient reports that she would like to cancel that appointment due to Covid 19 pandemic concerns.  She reports she received her custom compression sleeve and it fits really well.  She would like to order another in Beige and without the silver.  Therapist informed her that she will place a reorder today with [employer] compression.  Patient reports she does not require any changes to the sleeve fit at this time. She does report swelling with increased activity.  She is wearing her compression garments daily but has not resumed daily compression pump use so discussed the importance of incorporating her pump use back into her management routine.   Patient does continue to report pain and decreased range of motion secondary to the axillary web syndrome and she asked about obtaining a piece of dicem like we use in the clinic to work on the cords.  Discussed mailing her a piece of this material and then having a Telemedicine appt to teach her how to use it and to modfiy/educate in her home exercise program.  Patient expressed happiness about the potential to do this and she provided therapist with her email to allow for a link to be sent to her to set up her My Chart to allow for Telemedicine.  Therapist has placed the dicem in the mail for her and her new garment order has been sent to [employer] compression.  Patient is to call therapist when she receives her dicem and has her My Chart set up to  allow for her Telemedicine visit.    Rolan Bucco, MSPT, CLT-LANA

## 2019-01-21 ENCOUNTER — Telehealth: Attending: Specialist

## 2019-01-21 ENCOUNTER — Telehealth: Admit: 2019-01-21 | Discharge: 2019-01-21 | Payer: MEDICARE | Attending: Specialist | Primary: Family Medicine

## 2019-01-21 ENCOUNTER — Encounter: Attending: Specialist | Primary: Family Medicine

## 2019-01-21 DIAGNOSIS — Z170 Estrogen receptor positive status [ER+]: Secondary | ICD-10-CM

## 2019-01-21 DIAGNOSIS — C50312 Malignant neoplasm of lower-inner quadrant of left female breast: Secondary | ICD-10-CM

## 2019-01-21 NOTE — Progress Notes (Signed)
[facility]  [address], [place]   [address]  W: [phone]   F: [phone]      f/u HEME/ONC CONSULT      Reason for Visit:   Emily [name] is a 68 y.o. female who is seen by synchronous (real-time) audio-video technology for follow up of breast cancer      Consulting physician:  Dr. Gilford Rile, Dr. Raynald Blend, Brooke Bonito.  Ref physician:  Dr. Drucilla Schmidt    HPI:   Emily [name] is a 68 y.o.  female who I am seeing for management of breast cancer        She had a screening ultrasound on 09/24/12 showing a 2.6 cm left LN.  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  HER 2 negative IHC at 0.    She has a history of a pheochromocytoma surgically removed in 1997.    She then had a MRI breast on 10/05/12 showing the 2.8 x 2 cm LN as well as a left breast LIQ 0.9 cm x 1.1 cm x 0.6 cm mass.  Biopsy of that mass by FNA on 10/16/12 shows a poorly differentiated carcinoma.  These slides were air-dried, HER 2 not able to be performed on them.    Mammaprint of LN shows high risk luminal, ER + at 0.19, PR + at 0.61, HER 2 negative at -0.69.    She underwent L mastectomy on 11/28/12 showing IDC with neuroendocrine features, gr 2, 0.5 cm, DCIS gr 2-3, + LVI, 1/14 LN + with a 1 cm met, no ECE.  HER 2 negative (ratio 1.2, sig/cell 2.8), ER + 95%, PR + 5%; ki67 5%; DCIS ER + 95%, PR + 1%.    S/p TC q 3 weeks x 4 from 12/25/12-02/26/13 (75 mg/m2; 600 mg/m2)    S/p XRT from 04/02/13-05/10/13    Started anastrozole 05/17/13, stop 07/30/13 (joint pains)    exemestane 08/06/13-09/30/13 (joint pains)    Started zometa 07/10/13, q 6 months x 3 years, stopped     Started tamoxifen 09/30/13, stopped on 03/08/14 due to joint pains    Started  letrozole 03/23/14, stopped 09/18/14 due to joint pains    Started tamoxifen 10/04/14, stopped on 12/15/14 due to hot flashes and mood swings.    Anastrozole 12/16/14-01/30/15, stopped due to headache and dizziness    Toremefine 02/09/15-05/2018    Interval History: had cellulitis in feb, was treated at [facility]. Complains of nodule in right armpit    She plans on moving to [place], recently bought house.      LMP 1996.    DX   Encounter Diagnoses   Name Primary?   ??? Malignant neoplasm of lower-inner quadrant of left breast in female, estrogen receptor positive ([place]) Yes   ??? Rheumatoid arthritis, involving unspecified site, unspecified rheumatoid factor presence ([place])       Past Medical History:   Diagnosis Date   ??? Anxiety    ??? Arthritis    ??? Basal cell carcinoma    ??? Cancer ([place])     left breast  cancer   ??? Chronic tension headaches    ??? Concussion 2011   ??? Headache(784.0)     OCCAS   ??? Hx of radiation therapy  COMPLETED IN 2014   ??? Ill-defined condition     cellulitis L UE   ??? Lymphedema of arm     left   ??? Nausea & vomiting    ??? Other and unspecified symptoms and signs involving general sensations and perceptions     HIVES RECENTLY- WAS IN ER Feb 19, 2013   ??? Other ill-defined conditions(799.89)     PHEOCHROMOCYTOMA   ??? S/P chemotherapy, time since greater than 12 weeks     COMPLETED CHEMOTHERAPY IN JUNE 2014   ??? SCCA (squamous cell carcinoma) of skin      Past Surgical History:   Procedure Laterality Date   ??? ABDOMEN SURGERY PROC UNLISTED      hernia mesh repair   ??? ABDOMEN SURGERY PROC UNLISTED      right adenal removed   ??? BREAST SURGERY PROCEDURE UNLISTED  '91    breast implants; replaced bilat implants 2011   ??? HX APPENDECTOMY     ??? HX BREAST AUGMENTATION Left 04/22/2014    REMOVAL OF LEFT BREAST IMPLANT, WASHOUT AND CLOSURE OF LEFT BREAST WOUND performed by Sharlotte Alamo, MD at [facility] [place]   ??? HX BREAST RECONSTRUCTION  11/28/2012    BREAST RECONSTRUCTION /C  INSERTION EXPANDER & ALLODERM performed by Sharlotte Alamo,  MD at [place]   ??? HX BREAST RECONSTRUCTION Bilateral 09/16/2013    REMOVAL AND REPLACEMENT LEFT BREAST IMPLANT TISSUE EXPANDERS WITH SUBTOTAL CAPSULECTOMY, RIGHT BREAST IMPLANT REMOVAL AND REPLACEMENT FOR SYMMETRY/FAT GRAFTING TO BILATERAL BREASTS performed by Sharlotte Alamo, MD at [place]   ??? HX GYN      c-section   ??? HX HEENT      T&A   ??? HX HEENT      right adrenal removed   ??? HX MASTECTOMY Left    ??? HX MODIFIED RADICAL MASTECTOMY  11/28/2012    LEFT BREAST MODIFIED RADICAL MASTECTOMY WITH AXILLARY DISSECTION W/ RECONSTRUCTION LEFT BREAST WITH TISSUE EXPANDER AND ALLODERM;PORTACATH INSERTION performed by Ty Hilts, MD at [facility]   ??? HX OOPHORECTOMY      right   ??? HX ORTHOPAEDIC      bilateral shoulder arthroscopy   ??? HX VASCULAR ACCESS      PORT, THEN REMOVED     Social History     Socioeconomic History   ??? Marital status: SINGLE     Spouse name: Not on file   ??? Number of children: Not on file   ??? Years of education: Not on file   ??? Highest education level: Not on file   Tobacco Use   ??? Smoking status: Never Smoker   ??? Smokeless tobacco: Never Used   Substance and Sexual Activity   ??? Alcohol use: No   ??? Drug use: No     Family History   Problem Relation Age of Onset   ??? Alzheimer Mother    ??? Diabetes Mother    ??? Other Mother         COMBATIVE POST ANESTHESIA   ??? Heart Disease Father    ??? Diabetes Father    ??? Lung Disease Father         COPD   ??? Headache Sister    ??? Migraines Sister    ??? Anesth Problems Neg Hx        Current Outpatient Medications   Medication Sig Dispense Refill   ??? oxyCODONE IR (ROXICODONE) 5 mg immediate release tablet  Take 5 mg by mouth every six (6) hours as needed for Pain.     ??? acetaminophen (TYLENOL) 500 mg tablet Take 500 mg by mouth every six (6) hours as needed for Pain.     ??? butalbital-acetaminophen-caff (FIORICET) 50-300-40 mg per capsule Take 1 Cap by mouth every four (4) hours as needed for Headache.     ??? DULoxetine (CYMBALTA) 30 mg capsule Take  30 mg by mouth two (2) times a day.  3   ??? zolpidem (AMBIEN) 10 mg tablet TAKE 1 TABLET BY MOUTH AT BEDTIME  3       Allergies   Allergen Reactions   ??? Aspirin Unknown (comments)     Gi bleeding   ??? Bacitracin Hives and Swelling   ??? Compazine [Prochlorperazine] Rash   ??? Nsaids (Non-Steroidal Anti-Inflammatory Drug) Swelling     Throat swells       Review of Systems    A comprehensive review of systems was performed and all systems were negative except for HPI and for the symptom report form, reviewed and scanned in.      Objective:  Physical Exam:  There were no vitals taken for this visit.      Constitutional: Appears well-developed and well-nourished in no apparent distress      Mental status: Alert and awake, Oriented to person/place/time, Able to follow commands    Eyes: EOM normal, Sclera normal, No visible ocular discharge    HENT: Normocephalic, atraumatic    Mouth/Throat: Moist mucous membranes    External Ears normal    Neck: No visualized mass    Pulmonary/Chest: Respiratory effort normal, No visualized signs of difficulty breathing or respiratory distress    Musculoskeletal: Normal gait with no signs of ataxia, Normal range of motion of neck    Neurological: No facial asymmetry (Cranial nerve 7 motor function), No gaze palsy    Skin: No significant exanthematous lesions or discoloration noted on facial skin    Psychiatric: Normal affect, No hallucinations     Due to this being a TeleHealth evaluation, many elements of the physical examination are unable to be assessed.                                                                Diagnostic Imaging   10/12/12 PET:  Negative for distant mets  10/22/12 brain MRI:  Negative    09/24/12 dexa L hip T -2.5; L spine T -2.3  osteoporosis    12/11/13 bilateral MRI  Negative    03/26/14 MRI Brain  Negative    12/05/14 MRI breast   Negative    12/05/14 [place] L breast?? ????  IMPRESSION:  1. Very small, hypoechoic lesion in continuity and immediately below the ??  dermis overlying  the sternum/medialmost left breast. Finding suggests dermal ??  lesion such as epidermal inclusion cyst versus postoperative change.  BI-RADS Category 2 - Benign findings. ??    01/01/16 breast MRI  Negative    08/22/16 Breast MRI  Negative    04/17/17 [place] right ext  IMPRESSION: Probable lipoma in the right axillary region. Given that this lesion is  palpable, clinical follow-up is recommended.     08/31/17 MRI breast  IMPRESSION:   1. No MRI evidence of malignancy.  2. Bilateral silicone implants are intact.    11/23/18 ct chest wo contrast  IMPRESSION:  Postsurgical and postradiation changes of the thorax  Nodules appear to correspond to nodular pleural thickening  Nonspecific soft tissue induration in the left axilla. Please correlate with  clinical factors. If there is concern regarding vasculitis or thrombosis, then  Doppler ultrasound would be recommended.  Right thyroid nodule would be better evaluated by thyroid ultrasound.  Incidental granulomatous disease.    09/07/19 MRI breast  IMPRESSION:  ??  Right Breast:  1. BI-RADS Assessment Category 2: Benign finding. Submuscular silicone implant  which is intact. No complicating features.  2. No evidence of malignancy within the right breast.  3. No significant change.  ??  Left Breast:  1. BI-RADS Assessment Category 2: Benign finding. Stable and benign postsurgical  changes of mastectomy with submuscular silicone implant reconstruction. The  implant is intact.  2. No evidence of recurrent malignancy within the reconstructed left breast.  Specifically, no suspicious findings in the left axillary tail.  3. No significant change.  ??    08/31/18 MRI brain  IMPRESSION:   ??  1. No acute intracranial abnormality. No evidence of intracranial metastases.  2. Unchanged mild chronic microvascular ischemic disease.  3. Stable incidental pineal cyst.  ??  Assessment: ACR BI-RADS category   Left breast: BI-RADS Assessment Category 1: Negative.  Right breast: BI-RADS Assessment  Category 1: Negative.    Lab Results  Lab Results   Component Value Date/Time    WBC 10.4 11/24/2018 05:15 AM    HGB 11.0 (L) 11/24/2018 05:15 AM    HCT 34.0 (L) 11/24/2018 05:15 AM    PLATELET 172 11/24/2018 05:15 AM    MCV 91.9 11/24/2018 05:15 AM       Lab Results   Component Value Date/Time    Sodium 138 11/26/2018 04:17 AM    Potassium 3.9 11/26/2018 04:17 AM    Chloride 107 11/26/2018 04:17 AM    CO2 25 11/26/2018 04:17 AM    Anion gap 6 11/26/2018 04:17 AM    Glucose 75 11/26/2018 04:17 AM    BUN 9 11/26/2018 04:17 AM    Creatinine 0.50 (L) 11/26/2018 04:17 AM    BUN/Creatinine ratio 18 11/26/2018 04:17 AM    GFR est AA >60 11/26/2018 04:17 AM    GFR est non-AA >60 11/26/2018 04:17 AM    Calcium 8.4 (L) 11/26/2018 04:17 AM    AST (SGOT) 64 (H) 11/25/2018 04:50 AM    Alk. phosphatase 88 11/25/2018 04:50 AM    Protein, total 5.8 (L) 11/25/2018 04:50 AM    Albumin 3.0 (L) 11/25/2018 04:50 AM    Globulin 2.8 11/25/2018 04:50 AM    A-G Ratio 1.1 11/25/2018 04:50 AM    ALT (SGPT) 122 (H) 11/25/2018 04:50 AM     Assessment/Plan:  68 y.o. female with a 0.5 cm left breast cancer, IDC with endocrine features, 1/14 LN +, gr 2, high risk mammaprint,  ER and PR +, HER 2 negative. PS 0    1. Breast cancer with neuroendocrine features, Left, lower inner quadrant, stage IIA, [id number]    With no further therapy, her RR is about 36%; endocrine therapy will decrease her RR to 19%; and 2nd gen chemo will decrease her RR to 12%; 3rd gen to 10%.  These #s may be higher with her high risk mammaprint.     Based on her pheo and now breast cancer history, I recommended genetic counseling -- referral  to [facility] made at prior visit. Has seen them and they have performed BRCA testing -- negative.  [facility] has run a PGL panel on 09/10/14.    No evidence of recurrence, completed toremifene in 05/2018.       Last eye exam: 07/2017, scheduled for 08/16/18.   Last gyn exam: 12/2017 Dr. Melina Modena    Breast MRI 08/31/18 negative, next scheduled for  08/31/2018.    2. Anxiety/Depression: Stable today; continue sertraline 100 mg daily. Valium prn.  Dr. Kalman Shan is managing her antidepressant    3. Osteoporosis: Had received zometa previously, which was given for one dose on 07/10/13. Switched to prolia 60 mg SC q 6 months, received 04/08/14,10/01/14, 03/10/15, 09/14/15. She had one remaining Prolia injection, but she had oral surgery in July 2017, discontinued    DEX[facility] 10/01/14 at the [facility], patient reports improvement. Repeat DEXA in 2017 showed stable osteoporosis.  dexa in 2018 showed improvement.  Repeat DEXA on 02/18/2018 showed worsening osteoporosis to left femoral neck and lumbar spine. This may have been due to stopping the prolia, as now I would not recommend stopping it unless another oral issue arises.    Currently taking Vit D3 2000 International Units daily.    Plan was to restart prolia 60 mg SC q 6 months in 03/2018, however she was unable to do this due to insurance.  She restarted 10/15/2018. Due again in July, will set up in July    4. Fibromyalgia/RA: Not currently on any treatment, previously discussed with patient that there are medical options to assist with her pain. She continues to follow up with her Rheumatologist every 6 weeks. She took MTX prior to cancer diagnosis which she did not tolerate.  Previously did not tolerate lyrica due to sedation.  She reports she has been receiving cortisone injections. She has also started Cymbalta which has helped.     5. Headaches:  Has chronic headaches, uses botox, seeing Dr. Roger Kill with botox    6. Right thyroid nodule:  Will get thyroid US    7. Right ax nodule:  She states this is chronic, she will call Dr. Drucilla Schmidt to eval    8. Pulmonary nodules:  New; likely radiation changes, discussed with Dr. Merrily Brittle; Will repeat chest CT in Aug, will order    I was in the office while conducting this encounter.  The patient was at her home.    Consent:  She and/or her healthcare decision maker is aware  that this patient-initiated Telehealth encounter is a billable service, with coverage as determined by her insurance carrier. She is aware that she may receive a bill and has provided verbal consent to proceed: Yes    Pursuant to the emergency declaration under the [place] and the [employer], 1135 waiver authority and the [employer] Act, this Virtual  Visit was conducted, with patient's (and/or legal guardian's) consent, to reduce the patient's risk of exposure to COVID-19 and provide necessary medical care.     Services were provided through a video synchronous discussion virtually to substitute for in-person clinic visit.  Sonda Rumble, MD

## 2019-01-21 NOTE — Progress Notes (Signed)
[facility]  [address] [place]   [address]  W: [phone]   F: [phone]      f/u HEME/ONC CONSULT      Reason for Visit:   Emily [name] is a 68 y.o. female who is seen by synchronous (real-time) audio-video technology for follow up of breast cancer      Consulting physician:  Dr. Gilford Rile, Dr. Raynald Blend, Brooke Bonito.  Ref physician:  Dr. Drucilla Schmidt    HPI:   Emily [name] is a 68 y.o.  female who I am seeing for management of breast cancer        She had a screening ultrasound on 09/24/12 showing a 2.6 cm left LN.  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  HER 2 negative IHC at 0.    She has a history of a pheochromocytoma surgically removed in 1997.    She then had a MRI breast on 10/05/12 showing the 2.8 x 2 cm LN as well as a left breast LIQ 0.9 cm x 1.1 cm x 0.6 cm mass.  Biopsy of that mass by FNA on 10/16/12 shows a poorly differentiated carcinoma.  These slides were air-dried, HER 2 not able to be performed on them.    Mammaprint of LN shows high risk luminal, ER + at 0.19, PR + at 0.61, HER 2 negative at -0.69.    She underwent L mastectomy on 11/28/12 showing IDC with neuroendocrine features, gr 2, 0.5 cm, DCIS gr 2-3, + LVI, 1/14 LN + with a 1 cm met, no ECE.  HER 2 negative (ratio 1.2, sig/cell 2.8), ER + 95%, PR + 5%; ki67 5%; DCIS ER + 95%, PR + 1%.    S/p TC q 3 weeks x 4 from 12/25/12-02/26/13 (75 mg/m2; 600 mg/m2)    S/p XRT from 04/02/13-05/10/13    Started anastrozole 05/17/13, stop 07/30/13 (joint pains)    exemestane 08/06/13-09/30/13 (joint pains)    Started zometa 07/10/13, q 6 months x 3 years, stopped     Started tamoxifen 09/30/13, stopped on 03/08/14 due to joint pains     Started letrozole 03/23/14, stopped 09/18/14 due to joint pains    Started tamoxifen 10/04/14, stopped on 12/15/14 due to hot flashes and mood swings.    Anastrozole 12/16/14-01/30/15, stopped due to headache and dizziness    Toremefine 02/09/15-05/2018    Interval History: had cellulitis in feb, was treated at [facility]. Complains of nodule in right armpit    She plans on moving to [place], recently bought house.      LMP 1996.    DX   Encounter Diagnoses   Name Primary?   ??? Malignant neoplasm of lower-inner quadrant of left breast in female, estrogen receptor positive ([place]) Yes   ??? Rheumatoid arthritis, involving unspecified site, unspecified rheumatoid factor presence ([place])       Past Medical History:   Diagnosis Date   ??? Anxiety    ??? Arthritis    ??? Basal cell carcinoma    ??? Cancer ([place])     left breast  cancer   ??? Chronic tension headaches    ??? Concussion 2011   ??? Headache(784.0)     OCCAS   ??? Hx of radiation therapy  COMPLETED IN 2014   ??? Ill-defined condition     cellulitis L UE   ??? Lymphedema of arm     left   ??? Nausea & vomiting    ??? Other and unspecified symptoms and signs involving general sensations and perceptions     HIVES RECENTLY- WAS IN ER Feb 19, 2013   ??? Other ill-defined conditions(799.89)     PHEOCHROMOCYTOMA   ??? S/P chemotherapy, time since greater than 12 weeks     COMPLETED CHEMOTHERAPY IN JUNE 2014   ??? SCCA (squamous cell carcinoma) of skin      Past Surgical History:   Procedure Laterality Date   ??? ABDOMEN SURGERY PROC UNLISTED      hernia mesh repair   ??? ABDOMEN SURGERY PROC UNLISTED      right adenal removed   ??? BREAST SURGERY PROCEDURE UNLISTED  '91    breast implants; replaced bilat implants 2011   ??? HX APPENDECTOMY     ??? HX BREAST AUGMENTATION Left 04/22/2014    REMOVAL OF LEFT BREAST IMPLANT, WASHOUT AND CLOSURE OF LEFT BREAST WOUND performed by Sharlotte Alamo, MD at [place]   ??? HX BREAST RECONSTRUCTION  11/28/2012    BREAST RECONSTRUCTION /C  INSERTION EXPANDER & ALLODERM performed by  Sharlotte Alamo, MD at [place]   ??? HX BREAST RECONSTRUCTION Bilateral 09/16/2013    REMOVAL AND REPLACEMENT LEFT BREAST IMPLANT TISSUE EXPANDERS WITH SUBTOTAL CAPSULECTOMY, RIGHT BREAST IMPLANT REMOVAL AND REPLACEMENT FOR SYMMETRY/FAT GRAFTING TO BILATERAL BREASTS performed by Sharlotte Alamo, MD at [place]   ??? HX GYN      c-section   ??? HX HEENT      T&A   ??? HX HEENT      right adrenal removed   ??? HX MASTECTOMY Left    ??? HX MODIFIED RADICAL MASTECTOMY  11/28/2012    LEFT BREAST MODIFIED RADICAL MASTECTOMY WITH AXILLARY DISSECTION W/ RECONSTRUCTION LEFT BREAST WITH TISSUE EXPANDER AND ALLODERM;PORTACATH INSERTION performed by Ty Hilts, MD at [place]   ??? HX OOPHORECTOMY      right   ??? HX ORTHOPAEDIC      bilateral shoulder arthroscopy   ??? HX VASCULAR ACCESS      PORT, THEN REMOVED     Social History     Socioeconomic History   ??? Marital status: SINGLE     Spouse name: Not on file   ??? Number of children: Not on file   ??? Years of education: Not on file   ??? Highest education level: Not on file   Tobacco Use   ??? Smoking status: Never Smoker   ??? Smokeless tobacco: Never Used   Substance and Sexual Activity   ??? Alcohol use: No   ??? Drug use: No     Family History   Problem Relation Age of Onset   ??? Alzheimer Mother    ??? Diabetes Mother    ??? Other Mother         COMBATIVE POST ANESTHESIA   ??? Heart Disease Father    ??? Diabetes Father    ??? Lung Disease Father         COPD   ??? Headache Sister    ??? Migraines Sister    ??? Anesth Problems Neg Hx        Current Outpatient Medications   Medication Sig Dispense Refill   ??? oxyCODONE IR (ROXICODONE) 5 mg immediate release tablet  Take 5 mg by mouth every six (6) hours as needed for Pain.     ??? acetaminophen (TYLENOL) 500 mg tablet Take 500 mg by mouth every six (6) hours as needed for Pain.     ??? butalbital-acetaminophen-caff (FIORICET) 50-300-40 mg per capsule Take 1 Cap by mouth every four (4) hours as needed for Headache.      ??? DULoxetine (CYMBALTA) 30 mg capsule Take 30 mg by mouth two (2) times a day.  3   ??? zolpidem (AMBIEN) 10 mg tablet TAKE 1 TABLET BY MOUTH AT BEDTIME  3       Allergies   Allergen Reactions   ??? Aspirin Unknown (comments)     Gi bleeding   ??? Bacitracin Hives and Swelling   ??? Compazine [Prochlorperazine] Rash   ??? Nsaids (Non-Steroidal Anti-Inflammatory Drug) Swelling     Throat swells       Review of Systems    A comprehensive review of systems was performed and all systems were negative except for HPI and for the symptom report form, reviewed and scanned in.      Objective:  Physical Exam:  There were no vitals taken for this visit.      Constitutional: Appears well-developed and well-nourished in no apparent distress      Mental status: Alert and awake, Oriented to person/place/time, Able to follow commands    Eyes: EOM normal, Sclera normal, No visible ocular discharge    HENT: Normocephalic, atraumatic    Mouth/Throat: Moist mucous membranes    External Ears normal    Neck: No visualized mass    Pulmonary/Chest: Respiratory effort normal, No visualized signs of difficulty breathing or respiratory distress    Musculoskeletal: Normal gait with no signs of ataxia, Normal range of motion of neck    Neurological: No facial asymmetry (Cranial nerve 7 motor function), No gaze palsy    Skin: No significant exanthematous lesions or discoloration noted on facial skin    Psychiatric: Normal affect, No hallucinations     Due to this being a TeleHealth evaluation, many elements of the physical examination are unable to be assessed.                                                                Diagnostic Imaging   10/12/12 PET:  Negative for distant mets  10/22/12 brain MRI:  Negative    09/24/12 dexa L hip T -2.5; L spine T -2.3  osteoporosis    12/11/13 bilateral MRI  Negative    03/26/14 MRI Brain  Negative    12/05/14 MRI breast   Negative    12/05/14 [place] L breast?? ????  IMPRESSION:   1. Very small, hypoechoic lesion in continuity and immediately below the ??  dermis overlying the sternum/medialmost left breast. Finding suggests dermal ??  lesion such as epidermal inclusion cyst versus postoperative change.  BI-RADS Category 2 - Benign findings. ??    01/01/16 breast MRI  Negative    08/22/16 Breast MRI  Negative    04/17/17 [place] right ext  IMPRESSION: Probable lipoma in the right axillary region. Given that this lesion is  palpable, clinical follow-up is recommended.     08/31/17 MRI breast  IMPRESSION:   1. No MRI evidence of malignancy.  2. Bilateral silicone implants are intact.    11/23/18 ct chest wo contrast  IMPRESSION:  Postsurgical and postradiation changes of the thorax  Nodules appear to correspond to nodular pleural thickening  Nonspecific soft tissue induration in the left axilla. Please correlate with  clinical factors. If there is concern regarding vasculitis or thrombosis, then  Doppler ultrasound would be recommended.  Right thyroid nodule would be better evaluated by thyroid ultrasound.  Incidental granulomatous disease.    09/07/19 MRI breast  IMPRESSION:  ??  Right Breast:  1. BI-RADS Assessment Category 2: Benign finding. Submuscular silicone implant  which is intact. No complicating features.  2. No evidence of malignancy within the right breast.  3. No significant change.  ??  Left Breast:  1. BI-RADS Assessment Category 2: Benign finding. Stable and benign postsurgical  changes of mastectomy with submuscular silicone implant reconstruction. The  implant is intact.  2. No evidence of recurrent malignancy within the reconstructed left breast.  Specifically, no suspicious findings in the left axillary tail.  3. No significant change.  ??    08/31/18 MRI brain  IMPRESSION:   ??  1. No acute intracranial abnormality. No evidence of intracranial metastases.  2. Unchanged mild chronic microvascular ischemic disease.  3. Stable incidental pineal cyst.  ??  Assessment: ACR BI-RADS category    Left breast: BI-RADS Assessment Category 1: Negative.  Right breast: BI-RADS Assessment Category 1: Negative.    Lab Results  Lab Results   Component Value Date/Time    WBC 10.4 11/24/2018 05:15 AM    HGB 11.0 (L) 11/24/2018 05:15 AM    HCT 34.0 (L) 11/24/2018 05:15 AM    PLATELET 172 11/24/2018 05:15 AM    MCV 91.9 11/24/2018 05:15 AM       Lab Results   Component Value Date/Time    Sodium 138 11/26/2018 04:17 AM    Potassium 3.9 11/26/2018 04:17 AM    Chloride 107 11/26/2018 04:17 AM    CO2 25 11/26/2018 04:17 AM    Anion gap 6 11/26/2018 04:17 AM    Glucose 75 11/26/2018 04:17 AM    BUN 9 11/26/2018 04:17 AM    Creatinine 0.50 (L) 11/26/2018 04:17 AM    BUN/Creatinine ratio 18 11/26/2018 04:17 AM    GFR est AA >60 11/26/2018 04:17 AM    GFR est non-AA >60 11/26/2018 04:17 AM    Calcium 8.4 (L) 11/26/2018 04:17 AM    AST (SGOT) 64 (H) 11/25/2018 04:50 AM    Alk. phosphatase 88 11/25/2018 04:50 AM    Protein, total 5.8 (L) 11/25/2018 04:50 AM    Albumin 3.0 (L) 11/25/2018 04:50 AM    Globulin 2.8 11/25/2018 04:50 AM    A-G Ratio 1.1 11/25/2018 04:50 AM    ALT (SGPT) 122 (H) 11/25/2018 04:50 AM     Assessment/Plan:  68 y.o. female with a 0.5 cm left breast cancer, IDC with endocrine features, 1/14 LN +, gr 2, high risk mammaprint,  ER and PR +, HER 2 negative. PS 0    1. Breast cancer with neuroendocrine features, Left, lower inner quadrant, stage IIA, [id number]    With no further therapy, her RR is about 36%; endocrine therapy will decrease her RR to 19%; and 2nd gen chemo will decrease her RR to 12%; 3rd gen to 10%.  These #s may be higher with her high risk mammaprint.     Based on her pheo and now breast cancer history, I recommended genetic counseling -- referral  to [facility] made at prior visit. Has seen them and they have performed BRCA testing -- negative.  [facility] has run a PGL panel on 09/10/14.    No evidence of recurrence, completed toremifene in 05/2018.        Last eye exam: 07/2017, scheduled for 08/16/18.   Last gyn exam: 12/2017 Dr. Melina Modena    Breast MRI 08/31/18 negative, next scheduled for 08/31/2018.    2. Anxiety/Depression: Stable today; continue sertraline 100 mg daily. Valium prn.  Dr. Kalman Shan is managing her antidepressant    3. Osteoporosis: Had received zometa previously, which was given for one dose on 07/10/13. Switched to prolia 60 mg SC q 6 months, received 04/08/14,10/01/14, 03/10/15, 09/14/15. She had one remaining Prolia injection, but she had oral surgery in July 2017, discontinued    DEXA 10/01/14 at the [facility], patient reports improvement. Repeat DEXA in 2017 showed stable osteoporosis.  dexa in 2018 showed improvement.  Repeat DEXA on 02/18/2018 showed worsening osteoporosis to left femoral neck and lumbar spine. This may have been due to stopping the prolia, as now I would not recommend stopping it unless another oral issue arises.    Currently taking Vit D3 2000 International Units daily.    Plan was to restart prolia 60 mg SC q 6 months in 03/2018, however she was unable to do this due to insurance.  She restarted 10/15/2018. Due again in July, will set up in July    4. Fibromyalgia/RA: Not currently on any treatment, previously discussed with patient that there are medical options to assist with her pain. She continues to follow up with her Rheumatologist every 6 weeks. She took MTX prior to cancer diagnosis which she did not tolerate.  Previously did not tolerate lyrica due to sedation.  She reports she has been receiving cortisone injections. She has also started Cymbalta which has helped.     5. Headaches:  Has chronic headaches, uses botox, seeing Dr. Roger Kill with botox    6. Right thyroid nodule:  Will get thyroid US    7. Right ax nodule:  She states this is chronic, she will call Dr. Drucilla Schmidt to eval    8. Pulmonary nodules:  New; likely radiation changes, discussed with Dr. Merrily Brittle; Will repeat chest CT in Aug, will order     I was in the office while conducting this encounter.  The patient was at her home.    Consent:  She and/or her healthcare decision maker is aware that this patient-initiated Telehealth encounter is a billable service, with coverage as determined by her insurance carrier. She is aware that she may receive a bill and has provided verbal consent to proceed: Yes    Pursuant to the emergency declaration under the [place] and the [employer], 1135 waiver authority and the [employer] Act, this Virtual  Visit was conducted, with patient's (and/or legal guardian's) consent, to reduce the patient's risk of exposure to COVID-19 and provide necessary medical care.     Services were provided through a video synchronous discussion virtually to substitute for in-person clinic visit.  Sonda Rumble, MD

## 2019-01-22 ENCOUNTER — Encounter: Payer: MEDICARE | Primary: Family Medicine

## 2019-01-31 ENCOUNTER — Encounter: Attending: Specialist | Primary: Family Medicine

## 2019-03-22 ENCOUNTER — Ambulatory Visit: Payer: MEDICARE | Primary: Family Medicine

## 2019-04-04 ENCOUNTER — Encounter

## 2019-04-09 ENCOUNTER — Ambulatory Visit: Attending: Specialist

## 2019-04-09 ENCOUNTER — Inpatient Hospital Stay: Admit: 2019-04-10 | Payer: MEDICARE | Primary: Family Medicine

## 2019-04-09 ENCOUNTER — Ambulatory Visit: Admit: 2019-04-09 | Payer: MEDICARE | Attending: Specialist | Primary: Family Medicine

## 2019-04-09 DIAGNOSIS — D071 Carcinoma in situ of vulva: Secondary | ICD-10-CM

## 2019-04-09 NOTE — Progress Notes (Signed)
Normal letter sent to address on file

## 2019-04-09 NOTE — Progress Notes (Signed)
[address], [place] [address]  P [phone]   F [phone]        Patient ID:  Name:  Emily [name]  MRN:  [id number]  DOB:  1951/07/22/67 y.o.  Date:  04/09/2019      HISTORY OF PRESENT ILLNESS:  Emily [name] is a 68 y.o. Caucasian postmenopausal female who came to me for a second opinion for vulvar dysplasia.  She is a patient of Dr. Crissie Sickles.  Two separate biopsies of the right labia minora revealed high-grade squamous intraepithelial lesion, severe vulvar dysplasia, or VIN 3.  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  He asked that I see her for consultation.     I discussed the pathology from the biopsy with her and explained the need for surgical excision of the lesion.  Since it was a precancerous lesion, the resection margins did not need to be radical.  A wide local excision or simple partial vulvectomy should be all that's required.  I recommended excising the skin tag along with the lesion, as they were immediately adjacent to one another.  She was taken to the OR on 05/18/18 for simple partial vulvectomy.    Operative findings: Small, flat, plaque on the right upper vulva, just lateral to the fold of the labia minora.  Small, raised, "skin tag" just above and lateral.  Both were excised completely and I was able to salvage most of the labia minora.     FINAL PATHOLOGIC DIAGNOSIS   Right upper vulva, partial vulvectomy:   Vulvar intraepithelial neoplasia, grade 3 of 3 (VIN III) focally extending to 3:00 and 9:00 peripheral skin margins   Remainder of skin margins and deep margin, no evidence of dysplasia   Incidental melanocytic nevus, intradermal type      She did have microscopically positive margins at 3 o'clock and 9 o'clock, though clinically the margins were widely negative.  I did not recommend any addition excision at this time, but I explained to her that she was at risk for recurrence.  I stressed to her the  importance of keeping an eye on the area to call if she saw any changes in the area.    She presents today for follow-up.  She is without complaints.  She denies any new lesions.      ROS:  GU and GI review:  Negative  Cardiopulmonary review:  Negative   Musculoskeletal:  Negative    A comprehensive review of systems was negative except for that written in the History of Present Illness., 10 point ROS      OB/GYN ROS:  There is no history of significant gyn problems or procedures.  Patient denies any abnormal bleeding or vaginal discharge.       Problem List:  Patient Active Problem List    Diagnosis Date Noted   ??? Cellulitis of left arm 11/23/2018   ??? VIN III (vulvar intraepithelial neoplasia III) 03/22/2018   ??? Lymphedema of left arm 08/21/2015   ??? Pheochromocytoma 08/21/2015   ??? Arthritis of both knees 08/21/2015   ??? 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 Malignant neoplasm of lower-inner quadrant of left female breast ([place]) 07/24/2015   ??? Breast cancer, stage 2 ([place]) 10/22/2012     PMH:  Past Medical History:   Diagnosis Date   ??? Anxiety    ??? Arthritis    ??? Basal  cell carcinoma    ??? Cancer ([place])     left breast  cancer   ??? Chronic tension headaches    ??? Concussion 2011   ??? Headache(784.0)     OCCAS   ??? Hx of radiation therapy     COMPLETED IN 2014   ??? Ill-defined condition     cellulitis L UE   ??? Lymphedema of arm     left   ??? Nausea & vomiting    ??? Other and unspecified symptoms and signs involving general sensations and perceptions     HIVES RECENTLY- WAS IN ER Feb 19, 2013   ??? Other ill-defined conditions(799.89)     PHEOCHROMOCYTOMA   ??? S/P chemotherapy, time since greater than 12 weeks     COMPLETED CHEMOTHERAPY IN JUNE 2014   ??? SCCA (squamous cell carcinoma) of skin       PSH:  Past Surgical History:   Procedure Laterality Date   ??? ABDOMEN SURGERY PROC UNLISTED      hernia mesh repair   ??? ABDOMEN SURGERY PROC UNLISTED      right adenal removed   ??? BREAST SURGERY PROCEDURE UNLISTED  '91     breast implants; replaced bilat implants 2011   ??? HX APPENDECTOMY     ??? HX BREAST AUGMENTATION Left 04/22/2014    REMOVAL OF LEFT BREAST IMPLANT, WASHOUT AND CLOSURE OF LEFT BREAST WOUND performed by Sharlotte Alamo, MD at [place]   ??? HX BREAST RECONSTRUCTION  11/28/2012    BREAST RECONSTRUCTION /C  INSERTION EXPANDER & ALLODERM performed by Sharlotte Alamo, MD at [place]   ??? HX BREAST RECONSTRUCTION Bilateral 09/16/2013    REMOVAL AND REPLACEMENT LEFT BREAST IMPLANT TISSUE EXPANDERS WITH SUBTOTAL CAPSULECTOMY, RIGHT BREAST IMPLANT REMOVAL AND REPLACEMENT FOR SYMMETRY/FAT GRAFTING TO BILATERAL BREASTS performed by Sharlotte Alamo, MD at [place]   ??? HX GYN      c-section   ??? HX HEENT      T&A   ??? HX HEENT      right adrenal removed   ??? HX MASTECTOMY Left    ??? HX MODIFIED RADICAL MASTECTOMY  11/28/2012    LEFT BREAST MODIFIED RADICAL MASTECTOMY WITH AXILLARY DISSECTION W/ RECONSTRUCTION LEFT BREAST WITH TISSUE EXPANDER AND ALLODERM;PORTACATH INSERTION performed by Ty Hilts, MD at [place]   ??? HX OOPHORECTOMY      right   ??? HX ORTHOPAEDIC      bilateral shoulder arthroscopy   ??? HX VASCULAR ACCESS      PORT, THEN REMOVED      Social History:  Social History     Tobacco Use   ??? Smoking status: Never Smoker   ??? Smokeless tobacco: Never Used   Substance Use Topics   ??? Alcohol use: No      Family History:  Family History   Problem Relation Age of Onset   ??? Alzheimer Mother    ??? Diabetes Mother    ??? Other Mother         COMBATIVE POST ANESTHESIA   ??? Heart Disease Father    ??? Diabetes Father    ??? Lung Disease Father         COPD   ??? Headache Sister    ??? Migraines Sister    ??? Anesth Problems Neg Hx       Medications: (reviewed)  Current Outpatient Medications   Medication Sig   ??? oxyCODONE IR (  ROXICODONE) 5 mg immediate release tablet Take 5 mg by mouth every six (6) hours as needed for Pain.   ??? acetaminophen (TYLENOL) 500 mg tablet Take 500 mg by mouth every six (6) hours as needed for Pain.    ??? butalbital-acetaminophen-caff (FIORICET) 50-300-40 mg per capsule Take 1 Cap by mouth every four (4) hours as needed for Headache.   ??? DULoxetine (CYMBALTA) 30 mg capsule Take 30 mg by mouth two (2) times a day.   ??? zolpidem (AMBIEN) 10 mg tablet TAKE 1 TABLET BY MOUTH AT BEDTIME     No current facility-administered medications for this visit.      Allergies: (reviewed)  Allergies   Allergen Reactions   ??? Aspirin Unknown (comments)     Gi bleeding   ??? Bacitracin Hives and Swelling   ??? Compazine [Prochlorperazine] Rash   ??? Nsaids (Non-Steroidal Anti-Inflammatory Drug) Swelling     Throat swells          OBJECTIVE:    Physical Exam:  VITAL SIGNS: Vitals:    04/09/19 0927   BP: 121/70   Pulse: 74   Temp: 97.6 ??F (36.4 ??C)   Weight: 117 lb 6.4 oz (53.3 kg)   Height: 5\' 6"  (1.676 m)     Body mass index is 18.95 kg/m??.   GENERAL APP: Conversant, alert, oriented. No acute distress.   HEENT: HEENT. No thyroid enlargement. No JVD.   Neck: Supple without restrictions.   RESPIRATORY: Clear to auscultation and percussion to the bases. No CVAT.   CARDIOVASC: RRR without murmur/rub.   GASTROINT: soft, non-tender, without masses or organomegaly   MUSCULOSKEL: no joint tenderness, deformity or swelling   EXTREMITIES: extremities normal, atraumatic, no cyanosis or edema   PELVIC: Vulvectomy incision site well healed.  Excellent cosmetic result.  No signs of residual disease.   RECTAL: Deferred   NODAL SURVEY: No suspicious lymphadenopathy or edema noted.   NEURO: Grossly intact. No acute deficit.       Lab Data:    Lab Results   Component Value Date/Time    WBC 10.4 11/24/2018 05:15 AM    HGB 11.0 (L) 11/24/2018 05:15 AM    HCT 34.0 (L) 11/24/2018 05:15 AM    PLATELET 172 11/24/2018 05:15 AM    MCV 91.9 11/24/2018 05:15 AM     Lab Results   Component Value Date/Time    Sodium 138 11/26/2018 04:17 AM    Potassium 3.9 11/26/2018 04:17 AM    Chloride 107 11/26/2018 04:17 AM    CO2 25 11/26/2018 04:17 AM    Anion gap 6 11/26/2018  04:17 AM    Glucose 75 11/26/2018 04:17 AM    BUN 9 11/26/2018 04:17 AM    Creatinine 0.50 (L) 11/26/2018 04:17 AM    BUN/Creatinine ratio 18 11/26/2018 04:17 AM    GFR est AA >60 11/26/2018 04:17 AM    GFR est non-AA >60 11/26/2018 04:17 AM    Calcium 8.4 (L) 11/26/2018 04:17 AM         Imaging:  None available      IMPRESSION/PLAN:  Emily [name] is a 68 y.o. female with history of VIN 3, managed with simple partial vulvectomy.  She has no evidence of disease on today's exam.  A pap smear was performed.  We will follow up on those results and plan on seeing her back in one year.      Signed By: Albertha Ghee, MD     04/09/2019/10:07 AM

## 2019-04-09 NOTE — Progress Notes (Signed)
Check up for history of VIN3.  1. Have you been to the ER, urgent care clinic since your last visit?  Hospitalized since your last visit?Yes, hospitalized for cellulitis in February.     2. Have you seen or consulted any other health care providers outside of the [facility] since your last visit?  Include any pap smears or colon screening. No

## 2019-04-09 NOTE — Progress Notes (Signed)
Normal pap smear.   HPV negative.

## 2019-04-09 NOTE — Progress Notes (Signed)
Check up for history of VIN3.  1. Have you been to the ER, urgent care clinic since your last visit?  Hospitalized since your last visit?Yes, hospitalized for cellulitis in February.     2. Have you seen or consulted any other health care providers outside of the [facility] since your last visit?  Include any pap smears or colon screening. No

## 2019-04-09 NOTE — Progress Notes (Signed)
Emily [name], Emily [name], Emily [name]  [phone]   F [phone]        Patient ID:  Name:  SHAROL CROGHAN  MRN:  [id number]  DOB:  [birth year]/68 y.o.  Date:  04/09/2019      HISTORY OF PRESENT ILLNESS:  WANEDA KLAMMER is a 68 y.o. Caucasian postmenopausal female who came to me for a second opinion for vulvar dysplasia.  She is a patient of Dr. Crissie Sickles.  Two separate biopsies of the right labia minora revealed high-grade squamous intraepithelial lesion, severe vulvar dysplasia, or VIN 3.  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  He asked that I see her for consultation.     I discussed the pathology from the biopsy with her and explained the need for surgical excision of the lesion.  Since it was a precancerous lesion, the resection margins did not need to be radical.  A wide local excision or simple partial vulvectomy should be all that's required.  I recommended excising the skin tag along with the lesion, as they were immediately adjacent to one another.  She was taken to the OR on 05/18/18 for simple partial vulvectomy.    Operative findings: Small, flat, plaque on the right upper vulva, just lateral to the fold of the labia minora.  Small, raised, "skin tag" just above and lateral.  Both were excised completely and I was able to salvage most of the labia minora.     FINAL PATHOLOGIC DIAGNOSIS   Right upper vulva, partial vulvectomy:   Vulvar intraepithelial neoplasia, grade 3 of 3 (VIN III) focally extending to 3:00 and 9:00 peripheral skin margins   Remainder of skin margins and deep margin, no evidence of dysplasia   Incidental melanocytic nevus, intradermal type      She did have microscopically positive margins at 3 o'clock and 9 o'clock, though clinically the margins were widely negative.  I did not recommend any addition excision at this time, but I explained to her that she was at  risk for recurrence.  I stressed to her the importance of keeping an eye on the area to call if she saw any changes in the area.    She presents today for follow-up.  She is without complaints.  She denies any new lesions.      ROS:  GU and GI review:  Negative  Cardiopulmonary review:  Negative   Musculoskeletal:  Negative    A comprehensive review of systems was negative except for that written in the History of Present Illness., 10 point ROS      OB/GYN ROS:  There is no history of significant gyn problems or procedures.  Patient denies any abnormal bleeding or vaginal discharge.       Problem List:  Patient Active Problem List    Diagnosis Date Noted   ??? Cellulitis of left arm 11/23/2018   ??? VIN III (vulvar intraepithelial neoplasia III) 03/22/2018   ??? Lymphedema of left arm 08/21/2015   ??? Pheochromocytoma 08/21/2015   ??? Arthritis of both knees 08/21/2015   ??? 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 Malignant neoplasm of lower-inner quadrant of left female breast ([place]) 07/24/2015   ??? Breast cancer, stage 2 ([place]) 10/22/2012     PMH:  Past Medical History:   Diagnosis Date   ??? Anxiety    ??? Arthritis    ??? Basal  cell carcinoma    ??? Cancer ([place])     left breast  cancer   ??? Chronic tension headaches    ??? Concussion 2011   ??? Headache(784.0)     OCCAS   ??? Hx of radiation therapy     COMPLETED IN 2014   ??? Ill-defined condition     cellulitis L UE   ??? Lymphedema of arm     left   ??? Nausea & vomiting    ??? Other and unspecified symptoms and signs involving general sensations and perceptions     HIVES RECENTLY- WAS IN ER Feb 19, 2013   ??? Other ill-defined conditions(799.89)     PHEOCHROMOCYTOMA   ??? S/P chemotherapy, time since greater than 12 weeks     COMPLETED CHEMOTHERAPY IN JUNE 2014   ??? SCCA (squamous cell carcinoma) of skin       PSH:  Past Surgical History:   Procedure Laterality Date   ??? ABDOMEN SURGERY PROC UNLISTED      hernia mesh repair   ??? ABDOMEN SURGERY PROC UNLISTED      right adenal removed    ??? BREAST SURGERY PROCEDURE UNLISTED  '91    breast implants; replaced bilat implants 2011   ??? HX APPENDECTOMY     ??? HX BREAST AUGMENTATION Left 04/22/2014    REMOVAL OF LEFT BREAST IMPLANT, WASHOUT AND CLOSURE OF LEFT BREAST WOUND performed by Sharlotte Alamo, MD at [facility]   ??? HX BREAST RECONSTRUCTION  11/28/2012    BREAST RECONSTRUCTION /C  INSERTION EXPANDER & ALLODERM performed by Sharlotte Alamo, MD at [facility]   ??? HX BREAST RECONSTRUCTION Bilateral 09/16/2013    REMOVAL AND REPLACEMENT LEFT BREAST IMPLANT TISSUE EXPANDERS WITH SUBTOTAL CAPSULECTOMY, RIGHT BREAST IMPLANT REMOVAL AND REPLACEMENT FOR SYMMETRY/FAT GRAFTING TO BILATERAL BREASTS performed by Sharlotte Alamo, MD at [facility]   ??? HX GYN      c-section   ??? HX HEENT      T&A   ??? HX HEENT      right adrenal removed   ??? HX MASTECTOMY Left    ??? HX MODIFIED RADICAL MASTECTOMY  11/28/2012    LEFT BREAST MODIFIED RADICAL MASTECTOMY WITH AXILLARY DISSECTION W/ RECONSTRUCTION LEFT BREAST WITH TISSUE EXPANDER AND ALLODERM;PORTACATH INSERTION performed by Ty Hilts, MD at [facility]   ??? HX OOPHORECTOMY      right   ??? HX ORTHOPAEDIC      bilateral shoulder arthroscopy   ??? HX VASCULAR ACCESS      PORT, THEN REMOVED      Social History:  Social History     Tobacco Use   ??? Smoking status: Never Smoker   ??? Smokeless tobacco: Never Used   Substance Use Topics   ??? Alcohol use: No      Family History:  Family History   Problem Relation Age of Onset   ??? Alzheimer Mother    ??? Diabetes Mother    ??? Other Mother         COMBATIVE POST ANESTHESIA   ??? Heart Disease Father    ??? Diabetes Father    ??? Lung Disease Father         COPD   ??? Headache Sister    ??? Migraines Sister    ??? Anesth Problems Neg Hx       Medications: (reviewed)  Current Outpatient Medications   Medication Sig   ??? oxyCODONE IR (  ROXICODONE) 5 mg immediate release tablet Take 5 mg by mouth every six (6) hours as needed for Pain.    ??? acetaminophen (TYLENOL) 500 mg tablet Take 500 mg by mouth every six (6) hours as needed for Pain.   ??? butalbital-acetaminophen-caff (FIORICET) 50-300-40 mg per capsule Take 1 Cap by mouth every four (4) hours as needed for Headache.   ??? DULoxetine (CYMBALTA) 30 mg capsule Take 30 mg by mouth two (2) times a day.   ??? zolpidem (AMBIEN) 10 mg tablet TAKE 1 TABLET BY MOUTH AT BEDTIME     No current facility-administered medications for this visit.      Allergies: (reviewed)  Allergies   Allergen Reactions   ??? Aspirin Unknown (comments)     Gi bleeding   ??? Bacitracin Hives and Swelling   ??? Compazine [Prochlorperazine] Rash   ??? Nsaids (Non-Steroidal Anti-Inflammatory Drug) Swelling     Throat swells          OBJECTIVE:    Physical Exam:  VITAL SIGNS: Vitals:    04/09/19 0927   BP: 121/70   Pulse: 74   Temp: 97.6 ??F (36.4 ??C)   Weight: 117 lb 6.4 oz (53.3 kg)   Height: 5\' 6"  (1.676 m)     Body mass index is 18.95 kg/m??.   GENERAL APP: Conversant, alert, oriented. No acute distress.   HEENT: HEENT. No thyroid enlargement. No JVD.   Neck: Supple without restrictions.   RESPIRATORY: Clear to auscultation and percussion to the bases. No CVAT.   CARDIOVASC: RRR without murmur/rub.   GASTROINT: soft, non-tender, without masses or organomegaly   MUSCULOSKEL: no joint tenderness, deformity or swelling   EXTREMITIES: extremities normal, atraumatic, no cyanosis or edema   PELVIC: Vulvectomy incision site well healed.  Excellent cosmetic result.  No signs of residual disease.   RECTAL: Deferred   NODAL SURVEY: No suspicious lymphadenopathy or edema noted.   NEURO: Grossly intact. No acute deficit.       Lab Data:    Lab Results   Component Value Date/Time    WBC 10.4 11/24/2018 05:15 AM    HGB 11.0 (L) 11/24/2018 05:15 AM    HCT 34.0 (L) 11/24/2018 05:15 AM    PLATELET 172 11/24/2018 05:15 AM    MCV 91.9 11/24/2018 05:15 AM     Lab Results   Component Value Date/Time    Sodium 138 11/26/2018 04:17 AM     Potassium 3.9 11/26/2018 04:17 AM    Chloride 107 11/26/2018 04:17 AM    CO2 25 11/26/2018 04:17 AM    Anion gap 6 11/26/2018 04:17 AM    Glucose 75 11/26/2018 04:17 AM    BUN 9 11/26/2018 04:17 AM    Creatinine 0.50 (L) 11/26/2018 04:17 AM    BUN/Creatinine ratio 18 11/26/2018 04:17 AM    GFR est AA >60 11/26/2018 04:17 AM    GFR est non-AA >60 11/26/2018 04:17 AM    Calcium 8.4 (L) 11/26/2018 04:17 AM         Imaging:  None available      IMPRESSION/PLAN:  TYREKA HENNEKE is a 68 y.o. female with history of VIN 3, managed with simple partial vulvectomy.  She has no evidence of disease on today's exam.  A pap smear was performed.  We will follow up on those results and plan on seeing her back in one year.      Signed By: Albertha Ghee, MD     04/09/2019/10:07 AM

## 2019-04-12 ENCOUNTER — Inpatient Hospital Stay: Admit: 2019-04-12 | Payer: MEDICARE | Attending: Specialist | Primary: Family Medicine

## 2019-04-12 ENCOUNTER — Encounter

## 2019-04-12 ENCOUNTER — Inpatient Hospital Stay: Admit: 2019-04-12 | Payer: MEDICARE | Primary: Family Medicine

## 2019-04-12 DIAGNOSIS — R918 Other nonspecific abnormal finding of lung field: Secondary | ICD-10-CM

## 2019-04-12 DIAGNOSIS — Z170 Estrogen receptor positive status [ER+]: Secondary | ICD-10-CM

## 2019-04-12 DIAGNOSIS — C50312 Malignant neoplasm of lower-inner quadrant of left female breast: Secondary | ICD-10-CM

## 2019-04-12 LAB — MAGNESIUM
Magnesium: 2.3 mg/dL (ref 1.6–2.4)
Magnesium: 2.3 mg/dL (ref 1.6–2.4)

## 2019-04-12 LAB — POC CHEM8
ANION GAP ISTAT,IAGAP: 12 mmol/L (ref 10–20)
Anion gap (POC): 12 mmol/L (ref 10–20)
BUN (POC): 8 mg/dL — ABNORMAL LOW (ref 9–20)
BUN ISTAT,IBUN: 8 mg/dL — ABNORMAL LOW (ref 9–20)
CHLORIDE ISTAT,ICL: 96 mmol/L — ABNORMAL LOW (ref 98–107)
CO2 (POC): 27 mmol/L (ref 21–32)
Calcium, ionized (POC): 1.2 mmol/L (ref 1.12–1.32)
Calcium, ionized, POC: 1.2 mmol/L (ref 1.12–1.32)
Chloride (POC): 96 mmol/L — ABNORMAL LOW (ref 98–107)
Creatinine (POC): 0.4 mg/dL — ABNORMAL LOW (ref 0.6–1.3)
GFR African American: >60 mL/min/{1.73_m2} (ref >60)
GFR Non-African American: >60 mL/min/{1.73_m2} (ref >60)
GFRAA, POC: >60 mL/min/{1.73_m2} (ref >60)
GFRNA, POC: >60 mL/min/{1.73_m2} (ref >60)
GLUCOSE ISTAT,IGLU: 92 mg/dL (ref 65–100)
Glucose (POC): 92 mg/dL (ref 65–100)
HEMATOCRIT ISTAT,IHCT: 40 % (ref 35.0–47.0)
Hematocrit (POC): 40 % (ref 35.0–47.0)
POC Creatinine: 0.4 mg/dL — ABNORMAL LOW (ref 0.6–1.3)
POTASSIUM ISTAT,IK: 4.7 mmol/L (ref 3.5–5.1)
Potassium (POC): 4.7 mmol/L (ref 3.5–5.1)
SODIUM ISTAT,INA: 130 mmol/L — ABNORMAL LOW (ref 136–145)
Sodium (POC): 130 mmol/L — ABNORMAL LOW (ref 136–145)
TCO2 ISTAT,ITCO2: 27 mmol/L (ref 21–32)

## 2019-04-12 LAB — PHOSPHORUS
Phosphorus: 3.3 MG/DL (ref 2.6–4.7)
Phosphorus: 3.3 MG/DL (ref 2.6–4.7)

## 2019-04-12 MED ORDER — DENOSUMAB 60 MG/ML SUB-Q SYRINGE
60 mg/mL | Freq: Once | SUBCUTANEOUS | Status: AC
Start: 2019-04-12 — End: 2019-04-12
  Administered 2019-04-12: 14:00:00 via SUBCUTANEOUS

## 2019-04-12 MED FILL — PROLIA 60 MG/ML SUBCUTANEOUS SYRINGE: 60 mg/mL | SUBCUTANEOUS | Qty: 1

## 2019-04-12 NOTE — Progress Notes (Signed)
Discussed in tumor board -- will rescan in 3-6 months, ordered; discussed with pt; L side appears radiation induced

## 2019-04-12 NOTE — Progress Notes (Signed)
Ordered bx

## 2019-04-12 NOTE — Progress Notes (Signed)
Outpatient Infusion Center Short Visit Progress Note    0920 Patient admitted to [facility] for Prolia ambulatory in stable condition. Assessment completed. No new concerns voiced.   Vital Signs:  Visit Vitals  BP 125/74   Pulse 72   Temp 97.9 F (36.6 C)   Resp 16   SpO2 99%   Breastfeeding No         Labs drawn peripherally from right [facility] by phlebotomist and sent for processing.   Lab Results:  Recent Results (from the past 12 hour(s))   MAGNESIUM    Collection Time: 04/12/19  9:35 AM   Result Value Ref Range    Magnesium 2.3 1.6 - 2.4 mg/dL   PHOSPHORUS    Collection Time: 04/12/19  9:35 AM   Result Value Ref Range    Phosphorus 3.3 2.6 - 4.7 MG/DL   POC CHEM8    Collection Time: 04/12/19  9:40 AM   Result Value Ref Range    Calcium, ionized (POC) 1.20 1.12 - 1.32 mmol/L    Sodium (POC) 130 (L) 136 - 145 mmol/L    Potassium (POC) 4.7 3.5 - 5.1 mmol/L    Chloride (POC) 96 (L) 98 - 107 mmol/L    CO2 (POC) 27 21 - 32 mmol/L    Anion gap (POC) 12 10 - 20 mmol/L    Glucose (POC) 92 65 - 100 mg/dL    BUN (POC) 8 (L) 9 - 20 mg/dL    Creatinine (POC) 0.4 (L) 0.6 - 1.3 mg/dL    GFRAA, POC >29 >56 OZ/HYQ/6.57Q4    GFRNA, POC >60 >60 ml/min/1.53m2    Hematocrit (POC) 40 35.0 - 47.0 %    Comment Comment Not Indicated.             Medications:  Medications Administered     denosumab (PROLIA) injection 60 mg     Admin Date  04/12/2019 Action  Given Dose  60 mg Route  SubCUTAneous Administered By  Rema Jasmine, RN            Given SQ in right arm    Patient tolerated treatment well. Patient discharged from Outpatient [facility]fusion Center ambulatory in no distress at 1000. Patient aware of next appointment.    Future Appointments   Date Time Provider Department Center   04/12/2019 11:15 AM [facility] [place] 1 [facility] [place]   08/27/2019  9:15 AM Gust Brooms, NP [facility] SCHED   10/14/2019  9:30 AM [facility] INF7 CH3 <1H [facility]

## 2019-04-12 NOTE — Progress Notes (Signed)
Normal pap smear.   HPV negative.

## 2019-04-12 NOTE — Telephone Encounter (Signed)
04/12/2019 3:22 PM: Breast MRI order mailed to patient's PO box as requested.

## 2019-04-12 NOTE — Progress Notes (Signed)
Outpatient Infusion Center Short Visit Progress Note    0920 Patient admitted to [facility] for Prolia ambulatory in stable condition. Assessment completed. No new concerns voiced.   Vital Signs:  Visit Vitals  BP 125/74   Pulse 72   Temp 97.9 ??F (36.6 ??C)   Resp 16   SpO2 99%   Breastfeeding No         Labs drawn peripherally from right [facility] by phlebotomist and sent for processing.   Lab Results:  Recent Results (from the past 12 hour(s))   MAGNESIUM    Collection Time: 04/12/19  9:35 AM   Result Value Ref Range    Magnesium 2.3 1.6 - 2.4 mg/dL   PHOSPHORUS    Collection Time: 04/12/19  9:35 AM   Result Value Ref Range    Phosphorus 3.3 2.6 - 4.7 MG/DL   POC CHEM8    Collection Time: 04/12/19  9:40 AM   Result Value Ref Range    Calcium, ionized (POC) 1.20 1.12 - 1.32 mmol/L    Sodium (POC) 130 (L) 136 - 145 mmol/L    Potassium (POC) 4.7 3.5 - 5.1 mmol/L    Chloride (POC) 96 (L) 98 - 107 mmol/L    CO2 (POC) 27 21 - 32 mmol/L    Anion gap (POC) 12 10 - 20 mmol/L    Glucose (POC) 92 65 - 100 mg/dL    BUN (POC) 8 (L) 9 - 20 mg/dL    Creatinine (POC) 0.4 (L) 0.6 - 1.3 mg/dL    GFRAA, POC >60 >60 ml/min/1.65m2    GFRNA, POC >60 >60 ml/min/1.61m2    Hematocrit (POC) 40 35.0 - 47.0 %    Comment Comment Not Indicated.             Medications:  Medications Administered     denosumab (PROLIA) injection 60 mg     Admin Date  04/12/2019 Action  Given Dose  60 mg Route  SubCUTAneous Administered By  Ulyses Amor, RN            Given SQ in right arm    Patient tolerated treatment well. Patient discharged from [facility] ambulatory in no distress at 1000. Patient aware of next appointment.    Future Appointments   Date Time Provider [place]   04/12/2019 11:15 AM [facility] [place] 1 [facility] [place]   08/27/2019  9:15 AM Henriette Combs, NP [facility] SCHED   10/14/2019  9:30 AM SS INF7 CH3 <1H [facility]

## 2019-04-12 NOTE — Telephone Encounter (Signed)
Patient stopped by to see when her next MRI of Breast was due.  Confirm with Tarry Kos, she is due in December.  She would like the order sent to her for her next imaging due to she will be in [place] in December and would like to get it done there and have it sent to [name].      Please mail order to her [address] address

## 2019-04-15 NOTE — Telephone Encounter (Signed)
Pt Requesting we send results  of the C t scan and the thyroid scan from Friday to Endocrinologist---Dr [facility]   Fax number [phone]

## 2019-04-15 NOTE — Telephone Encounter (Signed)
Pt states she got the results for  Thyroid US and Chest CT of Lungs    And concerned of results and requesting to talk with Dr Laurena Bering --She is scheduled to go out of town this Wednesday to [place]

## 2019-04-15 NOTE — Telephone Encounter (Addendum)
04/15/2019 6:10 PM: Returned call to patient and inquired if she is moving to [place]. Patient stated, "We are moving some stuff to [place] but not all of it." Patient stated that she has been experiencing shortness of breath for the past 1-2 months and wheezing in her right lung. Patient stated, "It's always on the right side and I have to use my rescue albuterol inhaler." Patient also stated that she has been experiencing difficulty swallowing and her significant other had to perform the Heimlich maneuver while she was eating soup recently. Patient also reported throat discomfort that has "progressively gotten worse." Advised patient that per [name], if the patient is not moving to [place] he recommends having a biopsy of the thyroid performed. Also advised that per [name], he is going to review her CT results again with Dr. Merrily Brittle from [facility]. Patient wanted RN to let [name] know that she is not moving to [place] yet and that she would like to know who he recommends to do the thyroid biopsy. Patient also wanted [name] to know that she is very concerned about her health and would like a call back ASAP about the CT results. Patient also wanted to know if this office can fax the ultrasound and CT results to Dr. Odette Fraction, her endocrinologist. Advised patient that [name] will be consulted tomorrow and the patient will receive a call back.    04/16/2019 11:31 AM: Called patient and advised that [name] discussed her CT results with Dr. Merrily Brittle and per [name], this just looks like inflammation at this point. Advised that [name] wants to discuss this during the next multi-disciplinary meeting next week and have a different radiologist look at the scan, and then [name] will call the patient after that. Patient stated that she would like a call from [name] to discuss the CT results so she does not have to wait until next week.  Patient also requested that RN mail the results to the patient to the PO box listed in her chart. Patient stated that she would also like to let [name] know that she was taking Augmentin for about twenty days for "bronchitis/congestion" and finished taking it two days before the CT scan. Patient also stated that she scheduled an appointment with Dr. Hillard Danker to have her thyroid biopsied on 8/3 at 2 PM. Patient requested that RN send a copy of the CT results to Dr. Eulas Post at fax number [phone]. Advised patient that RN will send results as requested and will let [name] know that the patient requested to speak with him.

## 2019-04-16 NOTE — Telephone Encounter (Addendum)
04/16/2019 11:43 AM: Results faxed as requested.    04/22/2019 6:47 PM: Patient called earlier requesting to speak with [name] about her CT results. Returned call to patient and advised that [name] is going to discuss her CT results during a multi-disciplinary conference tomorrow afternoon and will call the patient himself tomorrow evening. Patient verbalized understanding and stated she will wait for his call.

## 2019-04-23 ENCOUNTER — Encounter

## 2019-05-06 ENCOUNTER — Encounter

## 2019-05-07 ENCOUNTER — Inpatient Hospital Stay: Admit: 2019-05-07 | Payer: MEDICARE | Attending: Specialist | Primary: Family Medicine

## 2019-05-07 ENCOUNTER — Encounter

## 2019-05-07 DIAGNOSIS — C50312 Malignant neoplasm of lower-inner quadrant of left female breast: Secondary | ICD-10-CM

## 2019-05-07 MED ORDER — LIDOCAINE (PF) 10 MG/ML (1 %) IJ SOLN
10 mg/mL (1 %) | Freq: Once | INTRAMUSCULAR | Status: AC
Start: 2019-05-07 — End: 2019-05-07
  Administered 2019-05-07: 13:00:00 via SUBCUTANEOUS

## 2019-05-09 ENCOUNTER — Encounter

## 2019-05-10 NOTE — Telephone Encounter (Signed)
Patient called to inquired about BX results.  She would like to get these results before the week-end.  She is very anxious      CB # [phone]

## 2019-05-10 NOTE — Telephone Encounter (Signed)
[facility] scheduling called and stated that they need a order for a biopsy but in so they can re due it.  CB#[phone] if you have questions

## 2019-05-13 ENCOUNTER — Encounter

## 2019-05-13 NOTE — Telephone Encounter (Signed)
Pt would like to talk with Nurse or Dr irvin-----pt states that the radiologist doesn't want to do another biopsy     Per Pt Dr,Irvin he does want another Biopsy    Pt states if she has to go else where she can

## 2019-05-16 ENCOUNTER — Inpatient Hospital Stay: Admit: 2019-05-16 | Payer: MEDICARE | Attending: Diagnostic Radiology | Primary: Family Medicine

## 2019-05-16 DIAGNOSIS — C50312 Malignant neoplasm of lower-inner quadrant of left female breast: Secondary | ICD-10-CM

## 2019-05-16 MED ORDER — LIDOCAINE (PF) 10 MG/ML (1 %) IJ SOLN
10 mg/mL (1 %) | Freq: Once | INTRAMUSCULAR | Status: AC
Start: 2019-05-16 — End: 2019-05-16
  Administered 2019-05-16: 13:00:00 via SUBCUTANEOUS

## 2019-05-17 ENCOUNTER — Ambulatory Visit: Attending: Surgery

## 2019-05-17 ENCOUNTER — Ambulatory Visit: Admit: 2019-05-17 | Discharge: 2019-05-17 | Payer: MEDICARE | Attending: Surgery | Primary: Family Medicine

## 2019-05-17 DIAGNOSIS — Z170 Estrogen receptor positive status [ER+]: Secondary | ICD-10-CM

## 2019-05-17 DIAGNOSIS — C50312 Malignant neoplasm of lower-inner quadrant of left female breast: Secondary | ICD-10-CM

## 2019-05-17 NOTE — Progress Notes (Signed)
Progress Notes by Emily [name]., MD at 05/17/19 1200                Author: Drucilla Schmidt Renne Musca., MD  Service: --  Author Type: Physician       Filed: 05/20/19 1247  Encounter Date: 05/17/2019  Status: Signed          Editor: Pellicane, Renne Musca., MD (Physician)               HISTORY OF PRESENT ILLNESS   Emily [name] is a 68 y.o.  female.   HPI   ESTABLISHED patient here for lump in RIGHT axilla. She has recently had an FNA and also a needle biopsy of her thyroid on the RIGHT side of her neck.   ??   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 1/14 nodes positive with a 1 cm met, no ECE. 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   ??   12/25/2012 - 02/26/2013: S/p TC q 3 weeks x 4 (75 mg/m2; 600 mg/m2)   ??   04/02/2013 - 05/10/2013: S/p XRT   ??   05/17/2013 - 07/30/2013: Anastrozole, stopped due to joint pains??   08/06/2013 - 09/30/2013: Exemestane stopped due joint pains   07/10/2013: Zometa q 6 months x 3 years, stopped    09/30/2013 - 03/08/2014: Tamoxifen, stopped 03/08/14 due to joint pains   03/23/2014 - 09/18/2014: Letrozole, stopped due to joint pains   10/04/2014 - 12/15/2014: Tamoxifen, stopped due to hot flashes and mood swings.   12/16/2014 - 01/30/2015: Anastrozole, stopped due to headache and dizziness   02/09/2015 - 05/2018: Toremefine   ??   Breast imaging-   09/2018 - breast MRI           Past Medical History:        Diagnosis  Date         ?  Anxiety       ?  Arthritis       ?  Basal cell carcinoma       ?  Cancer [facility])            left breast  cancer         ?  Chronic tension headaches       ?  Concussion  2011     ?  Headache(784.0)            OCCAS         ?  Hx of radiation therapy            COMPLETED IN 2014         ?  Ill-defined condition            cellulitis L UE         ?  Lymphedema of arm            left         ?  Nausea & vomiting       ?  Other and unspecified symptoms and signs involving general sensations  and perceptions            HIVES RECENTLY- WAS IN ER Feb 19, 2013         ?  Other ill-defined conditions(799.89)            PHEOCHROMOCYTOMA         ?  S/P chemotherapy, time since greater than 12 weeks  COMPLETED CHEMOTHERAPY IN JUNE 2014         ?  SCCA (squamous cell carcinoma) of skin               Past Surgical History:         Procedure  Laterality  Date          ?  ABDOMEN SURGERY PROC UNLISTED              hernia mesh repair          ?  ABDOMEN SURGERY PROC UNLISTED              right adenal removed          ?  BREAST SURGERY PROCEDURE UNLISTED    '91          breast implants; replaced bilat implants 2011          ?  HX APPENDECTOMY         ?  HX BREAST AUGMENTATION  Left  04/22/2014          REMOVAL OF LEFT BREAST IMPLANT, WASHOUT AND CLOSURE OF LEFT BREAST WOUND performed by Sharlotte Alamo, MD at [place]          ?  HX BREAST RECONSTRUCTION    11/28/2012          BREAST RECONSTRUCTION /C  INSERTION EXPANDER & ALLODERM performed by Sharlotte Alamo, MD at [place]          ?  HX BREAST RECONSTRUCTION  Bilateral  09/16/2013          REMOVAL AND REPLACEMENT LEFT BREAST IMPLANT TISSUE EXPANDERS WITH SUBTOTAL CAPSULECTOMY, RIGHT BREAST IMPLANT REMOVAL AND REPLACEMENT FOR SYMMETRY/FAT  GRAFTING TO BILATERAL BREASTS performed by Sharlotte Alamo, MD at [place]          ?  HX GYN              c-section          ?  HX HEENT              T&A          ?  HX HEENT              right adrenal removed          ?  HX MASTECTOMY  Left       ?  HX MODIFIED RADICAL MASTECTOMY    11/28/2012          LEFT BREAST MODIFIED RADICAL MASTECTOMY WITH AXILLARY DISSECTION W/ RECONSTRUCTION LEFT BREAST WITH TISSUE EXPANDER AND ALLODERM;PORTACATH INSERTION  performed by Emily Hilts, MD at [place]          ?  HX OOPHORECTOMY              right          ?  HX ORTHOPAEDIC              bilateral shoulder arthroscopy          ?  HX VASCULAR ACCESS              PORT, THEN REMOVED             Social  History          Socioeconomic History         ?  Marital status:  SINGLE  Spouse name:  Not on file         ?  Number of children:  Not on file     ?  Years of education:  Not on file     ?  Highest education level:  Not on file       Occupational History        ?  Not on file       Social Needs         ?  Financial resource strain:  Not on file        ?  Food insecurity              Worry:  Not on file         Inability:  Not on file        ?  Transportation needs              Medical:  Not on file         Non-medical:  Not on file       Tobacco Use         ?  Smoking status:  Never Smoker     ?  Smokeless tobacco:  Never Used       Substance and Sexual Activity         ?  Alcohol use:  No     ?  Drug use:  No     ?  Sexual activity:  Not on file       Lifestyle        ?  Physical activity              Days per week:  Not on file         Minutes per session:  Not on file         ?  Stress:  Not on file       Relationships        ?  Social Health visitor on phone:  Not on file         Gets together:  Not on file         Attends religious service:  Not on file         Active member of club or organization:  Not on file         Attends meetings of clubs or organizations:  Not on file         Relationship status:  Not on file        ?  Intimate partner violence              Fear of current or ex partner:  Not on file         Emotionally abused:  Not on file         Physically abused:  Not on file         Forced sexual activity:  Not on file        Other Topics  Concern        ?  Not on file       Social History Narrative        ?  Not on file             Current Outpatient Medications on File Prior to Visit          Medication  Sig  Dispense  Refill           ?  oxyCODONE IR (ROXICODONE) 5 mg immediate release tablet  Take 5 mg by mouth every six (6) hours as needed for Pain.         ?  acetaminophen (TYLENOL) 500 mg tablet  Take 500 mg by mouth every six (6) hours as needed for Pain.          ?  butalbital-acetaminophen-caff (FIORICET) 50-300-40 mg per capsule  Take 1 Cap by mouth every four (4) hours as needed for Headache.               ?  DULoxetine (CYMBALTA) 30 mg capsule  Take 30 mg by mouth two (2) times a day.    3           ?  zolpidem (AMBIEN) 10 mg tablet  TAKE 1 TABLET BY MOUTH AT BEDTIME    3          No current facility-administered medications on file prior to visit.              Allergies        Allergen  Reactions         ?  Aspirin  Unknown (comments)             Gi bleeding         ?  Bacitracin  Hives and Swelling     ?  Compazine [Prochlorperazine]  Rash     ?  Nsaids (Non-Steroidal Anti-Inflammatory Drug)  Swelling             Throat swells             OB History      No obstetric history on file.            Obstetric Comments      Menarche:  62.   LMP: 2.  # of Children:  2.  Age at Delivery of First Child:  7.   Hysterectomy/oophorectomy:  NO/NO.  Breast Bx:  no.   Hx of Breast Feeding:  yes.  BCP:  no. Hormone therapy:  Yes '98 to 12/13.                           ROS      Physical Exam   Exam conducted with a chaperone present.    Cardiovascular:       Rate and Rhythm: Normal rate and regular rhythm.      Heart sounds: Normal heart sounds.    Pulmonary:       Breath sounds: Normal breath sounds.   Chest:       Breasts: Breasts are symmetrical.          Right: Normal. No swelling, bleeding, inverted nipple, mass, nipple discharge, skin change or tenderness.         Left: Normal. No swelling, bleeding, inverted nipple, mass, nipple discharge, skin change or tenderness.         Lymphadenopathy :       Cervical:      Right cervical: No superficial, deep or posterior cervical adenopathy.     Left cervical: No superficial, deep or posterior cervical adenopathy.      Upper Body:      Right upper body: No supraclavicular or axillary adenopathy.       Left upper body: No supraclavicular or axillary adenopathy.  BREAST ULTRASOUND   Indication: RIGHT upper arm mass   Technique:  The area was scanned using a high-frequency linear-array near-field transducer   Findings: Mass consistent with lipoma   Impression: Lipoma   Disposition: No worrisome finding on ultrasound         ASSESSMENT and PLAN             ICD-10-CM  ICD-9-CM             1.  Malignant neoplasm of lower-inner quadrant of left breast in female, estrogen receptor positive ([place])   C50.312  174.3              Z17.0  V86.0             2.  Lipoma of upper arm   D17.79  214.8           Established patient presents for evaluation of RIGHT axillary lump, and is doing well overall. Well healed incision s/p LEFT mastectomy with implant reconstruction, no evidence of recurrence. Small lipoma on exam and [place] at RIGHT upper arm. No worrisome  findings. F/U PRN. This plan was reviewed with the patient and patient agrees. All questions were answered.      Written by Hart Robinsons, ScribeKick, as dictated by Dr. Sandford Craze, MD.

## 2019-05-17 NOTE — Progress Notes (Deleted)
HISTORY OF PRESENT ILLNESS  Emily [name] is a 68 y.o. female.  HPI ESTABLISHED patient here for lump in RIGHT axilla. She has recently had an FNA and also a needle biopsy of her thyroid on the RIGHT side of her neck.    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 1/14 nodes positive with a 1 cm met, no ECE. 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  ??  12/25/2012 - 02/26/2013: S/p TC q 3 weeks x 4 (75 mg/m2; 600 mg/m2)  ??  04/02/2013 - 05/10/2013: S/p XRT  ??  05/17/2013 - 07/30/2013: Anastrozole, stopped due to joint pains??  08/06/2013 - 09/30/2013: Exemestane stopped due joint pains  07/10/2013: Zometa q 6 months x 3 years, stopped   09/30/2013 - 03/08/2014: Tamoxifen, stopped 03/08/14 due to joint pains  03/23/2014 - 09/18/2014: Letrozole, stopped due to joint pains  10/04/2014 - 12/15/2014: Tamoxifen, stopped due to hot flashes and mood swings.  12/16/2014 - 01/30/2015: Anastrozole, stopped due to headache and dizziness  02/09/2015 - 05/2018: Toremefine    Breast imaging-  09/2018 - breast MRI    ROS    Physical Exam    ASSESSMENT and PLAN  {ASSESSMENT/PLAN:[id number]}

## 2019-05-17 NOTE — Progress Notes (Signed)
HISTORY OF PRESENT ILLNESS  Emily [name] is a 68 y.o. female.  HPI  ESTABLISHED patient here for lump in RIGHT axilla. She has recently had an FNA and also a needle biopsy of her thyroid on the RIGHT side of her neck.  ??  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 1/14 nodes positive with a 1 cm met, no ECE. 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  ??  12/25/2012 - 02/26/2013: S/p TC q 3 weeks x 4 (75 mg/m2; 600 mg/m2)  ??  04/02/2013 - 05/10/2013: S/p XRT  ??  05/17/2013 - 07/30/2013: Anastrozole, stopped due to joint pains??  08/06/2013 - 09/30/2013: Exemestane stopped due joint pains  07/10/2013: Zometa q 6 months x 3 years, stopped   09/30/2013 - 03/08/2014: Tamoxifen, stopped 03/08/14 due to joint pains  03/23/2014 - 09/18/2014: Letrozole, stopped due to joint pains  10/04/2014 - 12/15/2014: Tamoxifen, stopped due to hot flashes and mood swings.  12/16/2014 - 01/30/2015: Anastrozole, stopped due to headache and dizziness  02/09/2015 - 05/2018: Toremefine  ??  Breast imaging-  09/2018 - breast MRI      Past Medical History:   Diagnosis Date   ??? Anxiety    ??? Arthritis    ??? Basal cell carcinoma    ??? Cancer ([place])     left breast  cancer   ??? Chronic tension headaches    ??? Concussion 2011   ??? Headache(784.0)     OCCAS   ??? Hx of radiation therapy     COMPLETED IN 2014   ??? Ill-defined condition     cellulitis L UE   ??? Lymphedema of arm     left   ??? Nausea & vomiting    ??? Other and unspecified symptoms and signs involving general sensations and perceptions     HIVES RECENTLY- WAS IN ER Feb 19, 2013   ??? Other ill-defined conditions(799.89)     PHEOCHROMOCYTOMA   ??? S/P chemotherapy, time since greater than 12 weeks     COMPLETED CHEMOTHERAPY IN JUNE 2014   ??? SCCA (squamous cell carcinoma) of skin        Past Surgical History:   Procedure Laterality Date   ??? ABDOMEN SURGERY PROC UNLISTED      hernia mesh repair   ??? ABDOMEN SURGERY PROC UNLISTED       right adenal removed   ??? BREAST SURGERY PROCEDURE UNLISTED  '91    breast implants; replaced bilat implants 2011   ??? HX APPENDECTOMY     ??? HX BREAST AUGMENTATION Left 04/22/2014    REMOVAL OF LEFT BREAST IMPLANT, WASHOUT AND CLOSURE OF LEFT BREAST WOUND performed by Sharlotte Alamo, MD at [place]   ??? HX BREAST RECONSTRUCTION  11/28/2012    BREAST RECONSTRUCTION /C  INSERTION EXPANDER & ALLODERM performed by Sharlotte Alamo, MD at [place]   ??? HX BREAST RECONSTRUCTION Bilateral 09/16/2013    REMOVAL AND REPLACEMENT LEFT BREAST IMPLANT TISSUE EXPANDERS WITH SUBTOTAL CAPSULECTOMY, RIGHT BREAST IMPLANT REMOVAL AND REPLACEMENT FOR SYMMETRY/FAT GRAFTING TO BILATERAL BREASTS performed by Sharlotte Alamo, MD at [place]   ??? HX GYN      c-section   ??? HX HEENT      T&A   ??? HX HEENT      right adrenal removed   ??? HX MASTECTOMY Left    ??? HX MODIFIED RADICAL MASTECTOMY  11/28/2012  LEFT BREAST MODIFIED RADICAL MASTECTOMY WITH AXILLARY DISSECTION W/ RECONSTRUCTION LEFT BREAST WITH TISSUE EXPANDER AND ALLODERM;PORTACATH INSERTION performed by Ty Hilts, MD at [place]   ??? HX OOPHORECTOMY      right   ??? HX ORTHOPAEDIC      bilateral shoulder arthroscopy   ??? HX VASCULAR ACCESS      PORT, THEN REMOVED       Social History     Socioeconomic History   ??? Marital status: SINGLE     Spouse name: Not on file   ??? Number of children: Not on file   ??? Years of education: Not on file   ??? Highest education level: Not on file   Occupational History   ??? Not on file   Social Needs   ??? Financial resource strain: Not on file   ??? Food insecurity     Worry: Not on file     Inability: Not on file   ??? Transportation needs     Medical: Not on file     Non-medical: Not on file   Tobacco Use   ??? Smoking status: Never Smoker   ??? Smokeless tobacco: Never Used   Substance and Sexual Activity   ??? Alcohol use: No   ??? Drug use: No   ??? Sexual activity: Not on file   Lifestyle   ??? Physical activity     Days per week: Not on file      Minutes per session: Not on file   ??? Stress: Not on file   Relationships   ??? Social Product manager on phone: Not on file     Gets together: Not on file     Attends religious service: Not on file     Active member of club or organization: Not on file     Attends meetings of clubs or organizations: Not on file     Relationship status: Not on file   ??? Intimate partner violence     Fear of current or ex partner: Not on file     Emotionally abused: Not on file     Physically abused: Not on file     Forced sexual activity: Not on file   Other Topics Concern   ??? Not on file   Social History Narrative   ??? Not on file       Current Outpatient Medications on File Prior to Visit   Medication Sig Dispense Refill   ??? oxyCODONE IR (ROXICODONE) 5 mg immediate release tablet Take 5 mg by mouth every six (6) hours as needed for Pain.     ??? acetaminophen (TYLENOL) 500 mg tablet Take 500 mg by mouth every six (6) hours as needed for Pain.     ??? butalbital-acetaminophen-caff (FIORICET) 50-300-40 mg per capsule Take 1 Cap by mouth every four (4) hours as needed for Headache.     ??? DULoxetine (CYMBALTA) 30 mg capsule Take 30 mg by mouth two (2) times a day.  3   ??? zolpidem (AMBIEN) 10 mg tablet TAKE 1 TABLET BY MOUTH AT BEDTIME  3     No current facility-administered medications on file prior to visit.        Allergies   Allergen Reactions   ??? Aspirin Unknown (comments)     Gi bleeding   ??? Bacitracin Hives and Swelling   ??? Compazine [Prochlorperazine] Rash   ??? Nsaids (Non-Steroidal Anti-Inflammatory Drug) Swelling  Throat swells       OB History    No obstetric history on file.      Obstetric Comments   Menarche:  34.   LMP: 44.  # of Children:  2.  Age at Delivery of First Child:  15.   Hysterectomy/oophorectomy:  NO/NO.  Breast Bx:  no.  Hx of Breast Feeding:  yes.  BCP:  no. Hormone therapy:  Yes '98 to 12/13.             ROS    Physical Exam  Exam conducted with a chaperone present.   Cardiovascular:       Rate and Rhythm: Normal rate and regular rhythm.      Heart sounds: Normal heart sounds.   Pulmonary:      Breath sounds: Normal breath sounds.   Chest:      Breasts: Breasts are symmetrical.         Right: Normal. No swelling, bleeding, inverted nipple, mass, nipple discharge, skin change or tenderness.         Left: Normal. No swelling, bleeding, inverted nipple, mass, nipple discharge, skin change or tenderness.       Lymphadenopathy:      Cervical:      Right cervical: No superficial, deep or posterior cervical adenopathy.     Left cervical: No superficial, deep or posterior cervical adenopathy.      Upper Body:      Right upper body: No supraclavicular or axillary adenopathy.      Left upper body: No supraclavicular or axillary adenopathy.       BREAST ULTRASOUND  Indication: RIGHT upper arm mass  Technique: The area was scanned using a high-frequency linear-array near-field transducer  Findings: Mass consistent with lipoma  Impression: Lipoma  Disposition: No worrisome finding on ultrasound      ASSESSMENT and PLAN    ICD-10-CM ICD-9-CM    1. Malignant neoplasm of lower-inner quadrant of left breast in female, estrogen receptor positive ([place])  C50.312 174.3     Z17.0 V86.0    2. Lipoma of upper arm  D17.79 214.8       Established patient presents for evaluation of RIGHT axillary lump, and is doing well overall. Well healed incision s/p LEFT mastectomy with implant reconstruction, no evidence of recurrence. Small lipoma on exam and [place] at RIGHT upper arm. No worrisome findings. F/U PRN. This plan was reviewed with the patient and patient agrees. All questions were answered.    Written by Hart Robinsons, ScribeKick, as dictated by Dr. Sandford Craze, MD.

## 2019-05-17 NOTE — Patient Instructions (Signed)
Breast Self-Exam: Care Instructions  Your Care Instructions     A breast self-exam is when you check your breasts for lumps or changes. This regular exam helps you learn how your breasts normally look and feel. Most breast problems or changes are not because of cancer.  Breast self-exam is not a substitute for a mammogram. Having regular breast exams by your doctor and regular mammograms improve your chances of finding any problems with your breasts.  Some women set a time each month to do a step-by-step breast self-exam. Other women like a less formal system. They might look at their breasts as they brush their teeth, or feel their breasts once in a while in the shower.  If you notice a change in your breast, tell your doctor.  Follow-up care is a key part of your treatment and safety. Be sure to make and go to all appointments, and call your doctor if you are having problems. It's also a good idea to know your test results and keep a list of the medicines you take.  How do you do a breast self-exam?  ?? The best time to examine your breasts is usually one week after your menstrual period begins. Your breasts should not be tender then. If you do not have periods, you might do your exam on a day of the month that is easy to remember.  ?? To examine your breasts:  ? Remove all your clothes above the waist and lie down. When you are lying down, your breast tissue spreads evenly over your chest wall, which makes it easier to feel all your breast tissue.  ? Use the pads???not the fingertips???of the 3 middle fingers of your left hand to check your right breast. Move your fingers slowly in small coin-sized circles that overlap.  ? Use three levels of pressure to feel of all your breast tissue. Use light pressure to feel the tissue close to the skin surface. Use medium pressure to feel a little deeper. Use firm pressure to feel your tissue close to your breastbone and ribs. Use each pressure level to feel your  breast tissue before moving on to the next spot.  ? Check your entire breast, moving up and down as if following a strip from the collarbone to the bra line, and from the armpit to the ribs. Repeat until you have covered the entire breast.  ? Repeat this procedure for your left breast, using the pads of the 3 middle fingers of your right hand.  ?? To examine your breasts while in the shower:  ? Place one arm over your head and lightly soap your breast on that side.  ? Using the pads of your fingers, gently move your hand over your breast (in the strip pattern described above), feeling carefully for any lumps or changes.  ? Repeat for the other breast.  ?? Have your doctor inspect anything you notice to see if you need further testing.  Where can you learn more?  Go to http://www.healthwise.net/[facility]  Enter P148 in the search box to learn more about "Breast Self-Exam: Care Instructions."  Current as of: May 24, 2018??????????????????????????????Content Version: 12.5  ?? 2006-2020 Healthwise, Inc[employer].   Care instructions adapted under license by [employer] (which disclaims liability or warranty for this information). If you have questions about a medical condition or this instruction, always ask your healthcare professional. Healthwise, Incorporated disclaims any warranty or liability for your use of this information.

## 2019-05-20 NOTE — Telephone Encounter (Addendum)
I called and spoke with the Patient and let her know these results. She had some other concerns and question's also. She stated that she was told to let Dr.Irvin know if her SOB was still there but that she is STILL having it, and usually will have it in the evening time and then it will pass. She stated that there is a NEW PET CT injection out called the [place] [address] it is a Neuro Endocrine Cancer one that only [facility] is doing and Dr.Neal Carlota Raspberry is the one doing them and she stated that would like to have that test done since that test can detect things much further than what everyone is looking for.           ----- Message from Joyce Gross, MD sent at 05/20/2019 10:38 AM EDT -----  Please let she know this is normal

## 2019-06-12 ENCOUNTER — Encounter

## 2019-06-12 ENCOUNTER — Encounter: Payer: MEDICARE | Primary: Family Medicine

## 2019-06-13 NOTE — Telephone Encounter (Signed)
Patient called and stated that she needs [place] to call down stairs with the order for her prolia due for January she wants to make her appointments now.  [id number]

## 2019-06-14 NOTE — Telephone Encounter (Signed)
06/14/2019 9:21 AM: Called patient and left voicemail advising that an order for Prolia is already in her chart and per Colletta [place], Midwife in [place], she should be able to call the infusion center and schedule her appointment in January. Encouraged patient to call back with questions or concerns.

## 2019-06-27 ENCOUNTER — Telehealth

## 2019-06-27 NOTE — Telephone Encounter (Signed)
Patient called and stated that she needs a order for 5-6 bras from [facility]. They also need notes. Patient also stated that she needs a order for [facility] lymphedema clinic so she can go and get message for she swelling. She would like [place] to mail them to her  [phone]

## 2019-06-28 MED ORDER — MASTECTOMY PROSTHESIS
Freq: Every day | 1 refills | Status: AC
Start: 2019-06-28 — End: ?

## 2019-06-28 NOTE — Addendum Note (Signed)
Addendum  Note by Brock Ra, NP at 06/28/19 1446                Author: Brock Ra, NP  Service: --  Author Type: Nurse Practitioner       Filed: 06/28/19 1446  Encounter Date: 06/27/2019  Status: Signed          Editor: Brock Ra, NP (Nurse Practitioner)          Addended by: Lucrezia Europe T on: 06/28/2019 02:46 PM    Modules accepted: Orders

## 2019-06-28 NOTE — Addendum Note (Signed)
Addended by: Lucrezia Europe T on: 06/28/2019 02:46 PM     Modules accepted: Orders

## 2019-06-28 NOTE — Telephone Encounter (Signed)
06/28/2019 4:07 PM: Called patient and advised that M. Celine Ahr, NP, entered orders for the prosthesis bras and for the patient to be seen in the lymphedema clinic. Patient requested for the prescription and lymphedema order to be mailed to the PO box listed in her chart. RN will send as requested and also faxed the prescription and office note to [employer]. RN also called the lymphedema department and left voicemail requesting for the patient to be scheduled at the [facility] office.

## 2019-07-12 ENCOUNTER — Inpatient Hospital Stay: Admit: 2019-07-12 | Payer: MEDICARE | Attending: Specialist | Primary: Family Medicine

## 2019-07-12 DIAGNOSIS — R918 Other nonspecific abnormal finding of lung field: Secondary | ICD-10-CM

## 2019-07-12 NOTE — Progress Notes (Signed)
Discussed with pt; discussed that she should see a pulmonologist -- she says she has found one in [place] and will see them.  They will request records from that

## 2019-07-12 NOTE — Telephone Encounter (Signed)
Please call back as soon as you can to give her results.  Patient is very anxious about results      Cb # [phone]

## 2019-07-15 NOTE — Telephone Encounter (Signed)
Patient called and would like to know the results of her CT scan.  LJ:[id number]

## 2019-07-15 NOTE — Telephone Encounter (Signed)
07/15/2019 3:55 PM: Returned call to patient and advised that per [name], the CT did not show cancer and looks better. Advised that per [name], it looks like she may have a lung infection and he wants her to see a pulmonologist. Advised that [name] will call her to discuss this either later this afternoon or some other time this week. Patient verbalized understanding.

## 2019-08-27 ENCOUNTER — Telehealth: Attending: Specialist

## 2019-08-27 ENCOUNTER — Telehealth: Admit: 2019-08-27 | Discharge: 2019-08-27 | Payer: MEDICARE | Attending: Specialist | Primary: Family Medicine

## 2019-08-27 ENCOUNTER — Encounter: Attending: Specialist | Primary: Family Medicine

## 2019-08-27 DIAGNOSIS — C50312 Malignant neoplasm of lower-inner quadrant of left female breast: Secondary | ICD-10-CM

## 2019-08-27 DIAGNOSIS — Z853 Personal history of malignant neoplasm of breast: Secondary | ICD-10-CM

## 2019-08-27 NOTE — Progress Notes (Signed)
[facility]  [address] [place]   [address]  W: [phone]   F: [phone]      f/u HEME/ONC CONSULT      Reason for Visit:   Emily [name] is a 68 y.o. female who is seen by synchronous (real-time) audio-video technology for follow up of breast cancer      Consulting physician:  Dr. Gilford Rile, Dr. Raynald Blend, Brooke Bonito.  Ref physician:  Dr. Drucilla Schmidt    HPI:   Emily [name] is a 68 y.o.  female who I am seeing for management of breast cancer        She had a screening ultrasound on 09/24/12 showing a 2.6 cm left LN.  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  HER 2 negative IHC at 0.    She has a history of a pheochromocytoma surgically removed in 1997.    She then had a MRI breast on 10/05/12 showing the 2.8 x 2 cm LN as well as a left breast LIQ 0.9 cm x 1.1 cm x 0.6 cm mass.  Biopsy of that mass by FNA on 10/16/12 shows a poorly differentiated carcinoma.  These slides were air-dried, HER 2 not able to be performed on them.    Mammaprint of LN shows high risk luminal, ER + at 0.19, PR + at 0.61, HER 2 negative at -0.69.    She underwent L mastectomy on 11/28/12 showing IDC with neuroendocrine features, gr 2, 0.5 cm, DCIS gr 2-3, + LVI, 1/14 LN + with a 1 cm met, no ECE.  HER 2 negative (ratio 1.2, sig/cell 2.8), ER + 95%, PR + 5%; ki67 5%; DCIS ER + 95%, PR + 1%.    S/p TC q 3 weeks x 4 from 12/25/12-02/26/13 (75 mg/m2; 600 mg/m2)    S/p XRT from 04/02/13-05/10/13    Started anastrozole 05/17/13, stop 07/30/13 (joint pains)    exemestane 08/06/13-09/30/13 (joint pains)    Started zometa 07/10/13, q 6 months x 3 years, stopped     Started tamoxifen 09/30/13, stopped on 03/08/14 due to joint pains    Started  letrozole 03/23/14, stopped 09/18/14 due to joint pains    Started tamoxifen 10/04/14, stopped on 12/15/14 due to hot flashes and mood swings.    Anastrozole 12/16/14-01/30/15, stopped due to headache and dizziness    Toremefine 02/09/15-05/2018    Interval History:   Seeing pulmonologist.  Doing well overall    She plans on moving to [place], recently bought house.      LMP 1996.    DX   No diagnosis found.   Past Medical History:   Diagnosis Date   ??? Anxiety    ??? Arthritis    ??? Basal cell carcinoma    ??? Cancer ([place])     left breast  cancer   ??? Chronic tension headaches    ??? Concussion 2011   ??? Headache(784.0)     OCCAS   ??? Hx of radiation therapy     COMPLETED IN 2014   ??? Ill-defined condition     cellulitis L UE   ??? Lymphedema of arm     left   ??? Nausea & vomiting    ??? Other and unspecified symptoms  and signs involving general sensations and perceptions     HIVES RECENTLY- WAS IN ER Feb 19, 2013   ??? Other ill-defined conditions(799.89)     PHEOCHROMOCYTOMA   ??? S/P chemotherapy, time since greater than 12 weeks     COMPLETED CHEMOTHERAPY IN JUNE 2014   ??? SCCA (squamous cell carcinoma) of skin      Past Surgical History:   Procedure Laterality Date   ??? ABDOMEN SURGERY PROC UNLISTED      hernia mesh repair   ??? ABDOMEN SURGERY PROC UNLISTED      right adenal removed   ??? BREAST SURGERY PROCEDURE UNLISTED  '91    breast implants; replaced bilat implants 2011   ??? HX APPENDECTOMY     ??? HX BREAST AUGMENTATION Left 04/22/2014    REMOVAL OF LEFT BREAST IMPLANT, WASHOUT AND CLOSURE OF LEFT BREAST WOUND performed by Sharlotte Alamo, MD at [place]   ??? HX BREAST RECONSTRUCTION  11/28/2012    BREAST RECONSTRUCTION /C  INSERTION EXPANDER & ALLODERM performed by Sharlotte Alamo, MD at [place]   ??? HX BREAST RECONSTRUCTION Bilateral 09/16/2013    REMOVAL AND REPLACEMENT LEFT BREAST IMPLANT TISSUE EXPANDERS WITH SUBTOTAL CAPSULECTOMY, RIGHT BREAST IMPLANT REMOVAL AND REPLACEMENT FOR SYMMETRY/FAT GRAFTING TO BILATERAL BREASTS  performed by Sharlotte Alamo, MD at [place]   ??? HX GYN      c-section   ??? HX HEENT      T&A   ??? HX HEENT      right adrenal removed   ??? HX MASTECTOMY Left    ??? HX MODIFIED RADICAL MASTECTOMY  11/28/2012    LEFT BREAST MODIFIED RADICAL MASTECTOMY WITH AXILLARY DISSECTION W/ RECONSTRUCTION LEFT BREAST WITH TISSUE EXPANDER AND ALLODERM;PORTACATH INSERTION performed by Ty Hilts, MD at [place]   ??? HX OOPHORECTOMY      right   ??? HX ORTHOPAEDIC      bilateral shoulder arthroscopy   ??? HX VASCULAR ACCESS      PORT, THEN REMOVED     Social History     Socioeconomic History   ??? Marital status: SINGLE     Spouse name: Not on file   ??? Number of children: Not on file   ??? Years of education: Not on file   ??? Highest education level: Not on file   Tobacco Use   ??? Smoking status: Never Smoker   ??? Smokeless tobacco: Never Used   Substance and Sexual Activity   ??? Alcohol use: No   ??? Drug use: No     Family History   Problem Relation Age of Onset   ??? Alzheimer Mother    ??? Diabetes Mother    ??? Other Mother         COMBATIVE POST ANESTHESIA   ??? Heart Disease Father    ??? Diabetes Father    ??? Lung Disease Father         COPD   ??? Headache Sister    ??? Migraines Sister    ??? Anesth Problems Neg Hx        Current Outpatient Medications   Medication Sig Dispense Refill   ??? MASTECTOMY PROSTHESIS misc 1 Each by Does Not Apply route daily. 4 Each 1   ??? oxyCODONE IR (ROXICODONE) 5 mg immediate release tablet Take 5 mg by mouth every six (6) hours as needed for Pain.     ??? acetaminophen (TYLENOL) 500 mg tablet  Take 500 mg by mouth every six (6) hours as needed for Pain.     ??? butalbital-acetaminophen-caff (FIORICET) 50-300-40 mg per capsule Take 1 Cap by mouth every four (4) hours as needed for Headache.     ??? DULoxetine (CYMBALTA) 30 mg capsule Take 30 mg by mouth two (2) times a day.  3   ??? zolpidem (AMBIEN) 10 mg tablet TAKE 1 TABLET BY MOUTH AT BEDTIME  3       Allergies   Allergen Reactions   ??? Aspirin Unknown (comments)      Gi bleeding   ??? Bacitracin Hives and Swelling   ??? Compazine [Prochlorperazine] Rash   ??? Nsaids (Non-Steroidal Anti-Inflammatory Drug) Swelling     Throat swells       Review of Systems    A comprehensive review of systems was performed and all systems were negative except for HPI       Objective:  Physical Exam:  There were no vitals taken for this visit.      Constitutional: Appears well-developed and well-nourished in no apparent distress      Mental status: Alert and awake, Oriented to person/place/time, Able to follow commands    Eyes: EOM normal, Sclera normal, No visible ocular discharge    HENT: Normocephalic, atraumatic    Mouth/Throat: Moist mucous membranes    External Ears normal    Neck: No visualized mass    Pulmonary/Chest: Respiratory effort normal, No visualized signs of difficulty breathing or respiratory distress    Musculoskeletal: Normal gait with no signs of ataxia, Normal range of motion of neck    Neurological: No facial asymmetry (Cranial nerve 7 motor function), No gaze palsy    Skin: No significant exanthematous lesions or discoloration noted on facial skin    Psychiatric: Normal affect, No hallucinations     Due to this being a TeleHealth evaluation, many elements of the physical examination are unable to be assessed.                                                                Diagnostic Imaging   10/12/12 PET:  Negative for distant mets  10/22/12 brain MRI:  Negative    09/24/12 dexa L hip T -2.5; L spine T -2.3  osteoporosis    12/11/13 bilateral MRI  Negative    03/26/14 MRI Brain  Negative    12/05/14 MRI breast   Negative    12/05/14 [place] L breast?? ????  IMPRESSION:  1. Very small, hypoechoic lesion in continuity and immediately below the ??  dermis overlying the sternum/medialmost left breast. Finding suggests dermal ??  lesion such as epidermal inclusion cyst versus postoperative change.  BI-RADS Category 2 - Benign findings. ??    01/01/16 breast MRI  Negative    08/22/16 Breast  MRI  Negative    04/17/17 [place] right ext  IMPRESSION: Probable lipoma in the right axillary region. Given that this lesion is  palpable, clinical follow-up is recommended.     08/31/17 MRI breast  IMPRESSION:   1. No MRI evidence of malignancy.  2. Bilateral silicone implants are intact.    11/23/18 ct chest wo contrast  IMPRESSION:  Postsurgical and postradiation changes of the thorax  Nodules appear to correspond to nodular pleural  thickening  Nonspecific soft tissue induration in the left axilla. Please correlate with  clinical factors. If there is concern regarding vasculitis or thrombosis, then  Doppler ultrasound would be recommended.  Right thyroid nodule would be better evaluated by thyroid ultrasound.  Incidental granulomatous disease.    09/07/19 MRI breast  IMPRESSION:  ??  Right Breast:  1. BI-RADS Assessment Category 2: Benign finding. Submuscular silicone implant  which is intact. No complicating features.  2. No evidence of malignancy within the right breast.  3. No significant change.  ??  Left Breast:  1. BI-RADS Assessment Category 2: Benign finding. Stable and benign postsurgical  changes of mastectomy with submuscular silicone implant reconstruction. The  implant is intact.  2. No evidence of recurrent malignancy within the reconstructed left breast.  Specifically, no suspicious findings in the left axillary tail.  3. No significant change.  ??    08/31/18 MRI brain  IMPRESSION:   ??  1. No acute intracranial abnormality. No evidence of intracranial metastases.  2. Unchanged mild chronic microvascular ischemic disease.  3. Stable incidental pineal cyst.  ??  Assessment: ACR BI-RADS category   Left breast: BI-RADS Assessment Category 1: Negative.  Right breast: BI-RADS Assessment Category 1: Negative.    07/12/19 CT chest wo contrast  FINDINGS:  ??  CHEST WALL: Status post left mastectomy with implant reconstruction. Right  breast implant is also noted.  THYROID: Unchanged 1.5 cm low-attenuation right thyroid  nodule.  MEDIASTINUM: Unchanged small calcified mediastinal lymph nodes.  HILA: Unchanged small calcified left hilar lymph nodes.  THORACIC AORTA: No aneurysm.  MAIN PULMONARY ARTERY: Normal in caliber.  TRACHEA/BRONCHI: Patent.  ESOPHAGUS: No wall thickening or dilatation.  HEART: Normal in size.  PLEURA: No pleural effusion or pneumothorax.  LUNGS: Unchanged subpleural scarring in the left upper lobe, compatible with  post radiation treatment changes. Previously seen nodular airspace disease in  the right upper lobe has improved, with individual nodules measuring up to 3 mm.  Unchanged calcified granuloma in the left upper lobe. No new pulmonary nodule or  airspace disease.  INCIDENTALLY IMAGED UPPER ABDOMEN: Calcified granulomata in the spleen.  BONES: No destructive bone lesion.  ??  IMPRESSION  IMPRESSION:  Previously seen nodular airspace disease in the right upper lobe is improved;  infectious bronchiolitis, such as MAI, is favored. No new pulmonary nodule or  airspace disease. Unchanged subpleural fibrosis in the anterior left upper lobe,  compatible with posttreatment changes. Granulomatous disease.    Lab Results  Lab Results   Component Value Date/Time    WBC 10.4 11/24/2018 05:15 AM    HGB 11.0 (L) 11/24/2018 05:15 AM    HCT 34.0 (L) 11/24/2018 05:15 AM    PLATELET 172 11/24/2018 05:15 AM    MCV 91.9 11/24/2018 05:15 AM       Lab Results   Component Value Date/Time    Sodium 138 11/26/2018 04:17 AM    Potassium 3.9 11/26/2018 04:17 AM    Chloride 107 11/26/2018 04:17 AM    CO2 25 11/26/2018 04:17 AM    Anion gap 6 11/26/2018 04:17 AM    Glucose 75 11/26/2018 04:17 AM    BUN 9 11/26/2018 04:17 AM    Creatinine 0.50 (L) 11/26/2018 04:17 AM    BUN/Creatinine ratio 18 11/26/2018 04:17 AM    GFR est AA >60 11/26/2018 04:17 AM    GFR est non-AA >60 11/26/2018 04:17 AM    Calcium 8.4 (L) 11/26/2018 04:17 AM    Alk.  phosphatase 88 11/25/2018 04:50 AM    Protein, total 5.8 (L) 11/25/2018 04:50 AM    Albumin 3.0  (L) 11/25/2018 04:50 AM    Globulin 2.8 11/25/2018 04:50 AM    A-G Ratio 1.1 11/25/2018 04:50 AM    ALT (SGPT) 122 (H) 11/25/2018 04:50 AM     Assessment/Plan:  68 y.o. female with a 0.5 cm left breast cancer, IDC with endocrine features, 1/14 LN +, gr 2, high risk mammaprint,  ER and PR +, HER 2 negative. PS 0    1. Breast cancer with neuroendocrine features, Left, lower inner quadrant, stage IIA, [id number]    With no further therapy, her RR is about 36%; endocrine therapy will decrease her RR to 19%; and 2nd gen chemo will decrease her RR to 12%; 3rd gen to 10%.  These #s may be higher with her high risk mammaprint.     Based on her pheo and now breast cancer history, I recommended genetic counseling -- referral to [facility] made at prior visit. Has seen them and they have performed BRCA testing -- negative.  [facility] has run a PGL panel on 09/10/14.    No evidence of recurrence, completed toremifene in 05/2018.       Breast MRI 09/09/19    Discussed again that tumor markers are NOT recommended per ASCO guidelines    2. Anxiety/Depression: Stable today; on cymbalta     3. Osteoporosis: Had received zometa previously, which was given for one dose on 07/10/13. Switched to prolia 60 mg SC q 6 months, received 04/08/14,10/01/14, 03/10/15, 09/14/15. She had one remaining Prolia injection, but she had oral surgery in July 2017, discontinued    DEXA 10/01/14 at the [facility], patient reports improvement. Repeat DEXA in 2017 showed stable osteoporosis.  dexa in 2018 showed improvement.  Repeat DEXA on 02/18/2018 showed worsening osteoporosis to left femoral neck and lumbar spine. This may have been due to stopping the prolia, as now I would not recommend stopping it unless another oral issue arises.    Currently taking Vit D3 2000 International Units daily.    Plan was to restart prolia 60 mg SC q 6 months in 03/2018, however she was unable to do this due to insurance.  She restarted 10/15/2018.  Last dose was 04/12/19, due  around 10/08/18. Order is in, she will call to make appointment    Will order dexa at next visit    4. Fibromyalgia/RA: Not currently on any treatment, previously discussed with patient that there are medical options to assist with her pain. She continues to follow up with her Rheumatologist every 6 weeks. She took MTX prior to cancer diagnosis which she did not tolerate.  Previously did not tolerate lyrica due to sedation.  She reports she has been receiving cortisone injections. She has also started Cymbalta which has helped.     5. Right thyroid nodule:  bx negative 05/16/19    6. Right ax nodule: saw Dr. Drucilla Schmidt, lipoma    7. Pulmonary nodules:  Radiation changes vs. Infectious bronchiolitis; saw a pulmonologist in [place]; AFB x 3 pending for MAI.  I will defer further imaging or testing to pulmonary    I was in the office while conducting this encounter.  The patient was at her home.    Consent:  She and/or her healthcare decision maker is aware that this patient-initiated Telehealth encounter is a billable service, with coverage as determined by her insurance carrier. She is aware that she may  receive a bill and has provided verbal consent to proceed: Yes    Pursuant to the emergency declaration under the [place] waiver authority and the [employer] Act, this Virtual  Visit was conducted, with patient's (and/or legal guardian's) consent, to reduce the patient's risk of exposure to COVID-19 and provide necessary medical care.     Services were provided through a video synchronous discussion virtually to substitute for in-person clinic visit.  Sonda Rumble, MD

## 2019-08-27 NOTE — Progress Notes (Signed)
[facility]  [address], [place]   [address]  W: [phone]   F: [phone]      f/u HEME/ONC CONSULT      Reason for Visit:   Emily [name] is a 68 y.o. female who is seen by synchronous (real-time) audio-video technology for follow up of breast cancer      Consulting physician:  Dr. Gilford Rile, Dr. Raynald Blend, Brooke Bonito.  Ref physician:  Dr. Drucilla Schmidt    HPI:   Emily [name] is a 68 y.o.  female who I am seeing for management of breast cancer        She had a screening ultrasound on 09/24/12 showing a 2.6 cm left LN.  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  HER 2 negative IHC at 0.    She has a history of a pheochromocytoma surgically removed in 1997.    She then had a MRI breast on 10/05/12 showing the 2.8 x 2 cm LN as well as a left breast LIQ 0.9 cm x 1.1 cm x 0.6 cm mass.  Biopsy of that mass by FNA on 10/16/12 shows a poorly differentiated carcinoma.  These slides were air-dried, HER 2 not able to be performed on them.    Mammaprint of LN shows high risk luminal, ER + at 0.19, PR + at 0.61, HER 2 negative at -0.69.    She underwent L mastectomy on 11/28/12 showing IDC with neuroendocrine features, gr 2, 0.5 cm, DCIS gr 2-3, + LVI, 1/14 LN + with a 1 cm met, no ECE.  HER 2 negative (ratio 1.2, sig/cell 2.8), ER + 95%, PR + 5%; ki67 5%; DCIS ER + 95%, PR + 1%.    S/p TC q 3 weeks x 4 from 12/25/12-02/26/13 (75 mg/m2; 600 mg/m2)    S/p XRT from 04/02/13-05/10/13    Started anastrozole 05/17/13, stop 07/30/13 (joint pains)    exemestane 08/06/13-09/30/13 (joint pains)    Started zometa 07/10/13, q 6 months x 3 years, stopped     Started tamoxifen 09/30/13, stopped on 03/08/14 due to joint pains     Started letrozole 03/23/14, stopped 09/18/14 due to joint pains    Started tamoxifen 10/04/14, stopped on 12/15/14 due to hot flashes and mood swings.    Anastrozole 12/16/14-01/30/15, stopped due to headache and dizziness    Toremefine 02/09/15-05/2018    Interval History:   Seeing pulmonologist.  Doing well overall    She plans on moving to [place], recently bought house.      LMP 1996.    DX   No diagnosis found.   Past Medical History:   Diagnosis Date   ??? Anxiety    ??? Arthritis    ??? Basal cell carcinoma    ??? Cancer ([place])     left breast  cancer   ??? Chronic tension headaches    ??? Concussion 2011   ??? Headache(784.0)     OCCAS   ??? Hx of radiation therapy     COMPLETED IN 2014   ??? Ill-defined condition     cellulitis L UE   ??? Lymphedema of arm     left   ??? Nausea & vomiting    ??? Other and unspecified symptoms  and signs involving general sensations and perceptions     HIVES RECENTLY- WAS IN ER Feb 19, 2013   ??? Other ill-defined conditions(799.89)     PHEOCHROMOCYTOMA   ??? S/P chemotherapy, time since greater than 12 weeks     COMPLETED CHEMOTHERAPY IN JUNE 2014   ??? SCCA (squamous cell carcinoma) of skin      Past Surgical History:   Procedure Laterality Date   ??? ABDOMEN SURGERY PROC UNLISTED      hernia mesh repair   ??? ABDOMEN SURGERY PROC UNLISTED      right adenal removed   ??? BREAST SURGERY PROCEDURE UNLISTED  '91    breast implants; replaced bilat implants 2011   ??? HX APPENDECTOMY     ??? HX BREAST AUGMENTATION Left 04/22/2014    REMOVAL OF LEFT BREAST IMPLANT, WASHOUT AND CLOSURE OF LEFT BREAST WOUND performed by Sharlotte Alamo, MD at [place]   ??? HX BREAST RECONSTRUCTION  11/28/2012    BREAST RECONSTRUCTION /C  INSERTION EXPANDER & ALLODERM performed by Sharlotte Alamo, MD at [place]   ??? HX BREAST RECONSTRUCTION Bilateral 09/16/2013    REMOVAL AND REPLACEMENT LEFT BREAST IMPLANT TISSUE EXPANDERS WITH SUBTOTAL CAPSULECTOMY, RIGHT BREAST IMPLANT REMOVAL AND REPLACEMENT FOR  SYMMETRY/FAT GRAFTING TO BILATERAL BREASTS performed by Sharlotte Alamo, MD at [place]   ??? HX GYN      c-section   ??? HX HEENT      T&A   ??? HX HEENT      right adrenal removed   ??? HX MASTECTOMY Left    ??? HX MODIFIED RADICAL MASTECTOMY  11/28/2012    LEFT BREAST MODIFIED RADICAL MASTECTOMY WITH AXILLARY DISSECTION W/ RECONSTRUCTION LEFT BREAST WITH TISSUE EXPANDER AND ALLODERM;PORTACATH INSERTION performed by Ty Hilts, MD at [place]   ??? HX OOPHORECTOMY      right   ??? HX ORTHOPAEDIC      bilateral shoulder arthroscopy   ??? HX VASCULAR ACCESS      PORT, THEN REMOVED     Social History     Socioeconomic History   ??? Marital status: SINGLE     Spouse name: Not on file   ??? Number of children: Not on file   ??? Years of education: Not on file   ??? Highest education level: Not on file   Tobacco Use   ??? Smoking status: Never Smoker   ??? Smokeless tobacco: Never Used   Substance and Sexual Activity   ??? Alcohol use: No   ??? Drug use: No     Family History   Problem Relation Age of Onset   ??? Alzheimer Mother    ??? Diabetes Mother    ??? Other Mother         COMBATIVE POST ANESTHESIA   ??? Heart Disease Father    ??? Diabetes Father    ??? Lung Disease Father         COPD   ??? Headache Sister    ??? Migraines Sister    ??? Anesth Problems Neg Hx        Current Outpatient Medications   Medication Sig Dispense Refill   ??? MASTECTOMY PROSTHESIS misc 1 Each by Does Not Apply route daily. 4 Each 1   ??? oxyCODONE IR (ROXICODONE) 5 mg immediate release tablet Take 5 mg by mouth every six (6) hours as needed for Pain.     ??? acetaminophen (TYLENOL) 500 mg tablet  Take 500 mg by mouth every six (6) hours as needed for Pain.     ??? butalbital-acetaminophen-caff (FIORICET) 50-300-40 mg per capsule Take 1 Cap by mouth every four (4) hours as needed for Headache.     ??? DULoxetine (CYMBALTA) 30 mg capsule Take 30 mg by mouth two (2) times a day.  3   ??? zolpidem (AMBIEN) 10 mg tablet TAKE 1 TABLET BY MOUTH AT BEDTIME  3       Allergies    Allergen Reactions   ??? Aspirin Unknown (comments)     Gi bleeding   ??? Bacitracin Hives and Swelling   ??? Compazine [Prochlorperazine] Rash   ??? Nsaids (Non-Steroidal Anti-Inflammatory Drug) Swelling     Throat swells       Review of Systems    A comprehensive review of systems was performed and all systems were negative except for HPI       Objective:  Physical Exam:  There were no vitals taken for this visit.      Constitutional: Appears well-developed and well-nourished in no apparent distress      Mental status: Alert and awake, Oriented to person/place/time, Able to follow commands    Eyes: EOM normal, Sclera normal, No visible ocular discharge    HENT: Normocephalic, atraumatic    Mouth/Throat: Moist mucous membranes    External Ears normal    Neck: No visualized mass    Pulmonary/Chest: Respiratory effort normal, No visualized signs of difficulty breathing or respiratory distress    Musculoskeletal: Normal gait with no signs of ataxia, Normal range of motion of neck    Neurological: No facial asymmetry (Cranial nerve 7 motor function), No gaze palsy    Skin: No significant exanthematous lesions or discoloration noted on facial skin    Psychiatric: Normal affect, No hallucinations     Due to this being a TeleHealth evaluation, many elements of the physical examination are unable to be assessed.                                                                Diagnostic Imaging   10/12/12 PET:  Negative for distant mets  10/22/12 brain MRI:  Negative    09/24/12 dexa L hip T -2.5; L spine T -2.3  osteoporosis    12/11/13 bilateral MRI  Negative    03/26/14 MRI Brain  Negative    12/05/14 MRI breast   Negative    12/05/14 [place] L breast?? ????  IMPRESSION:  1. Very small, hypoechoic lesion in continuity and immediately below the ??  dermis overlying the sternum/medialmost left breast. Finding suggests dermal ??  lesion such as epidermal inclusion cyst versus postoperative change.  BI-RADS Category 2 - Benign findings. ??     01/01/16 breast MRI  Negative    08/22/16 Breast MRI  Negative    04/17/17 [place] right ext  IMPRESSION: Probable lipoma in the right axillary region. Given that this lesion is  palpable, clinical follow-up is recommended.     08/31/17 MRI breast  IMPRESSION:   1. No MRI evidence of malignancy.  2. Bilateral silicone implants are intact.    11/23/18 ct chest wo contrast  IMPRESSION:  Postsurgical and postradiation changes of the thorax  Nodules appear to correspond to nodular pleural  thickening  Nonspecific soft tissue induration in the left axilla. Please correlate with  clinical factors. If there is concern regarding vasculitis or thrombosis, then  Doppler ultrasound would be recommended.  Right thyroid nodule would be better evaluated by thyroid ultrasound.  Incidental granulomatous disease.    09/07/19 MRI breast  IMPRESSION:  ??  Right Breast:  1. BI-RADS Assessment Category 2: Benign finding. Submuscular silicone implant  which is intact. No complicating features.  2. No evidence of malignancy within the right breast.  3. No significant change.  ??  Left Breast:  1. BI-RADS Assessment Category 2: Benign finding. Stable and benign postsurgical  changes of mastectomy with submuscular silicone implant reconstruction. The  implant is intact.  2. No evidence of recurrent malignancy within the reconstructed left breast.  Specifically, no suspicious findings in the left axillary tail.  3. No significant change.  ??    08/31/18 MRI brain  IMPRESSION:   ??  1. No acute intracranial abnormality. No evidence of intracranial metastases.  2. Unchanged mild chronic microvascular ischemic disease.  3. Stable incidental pineal cyst.  ??  Assessment: ACR BI-RADS category   Left breast: BI-RADS Assessment Category 1: Negative.  Right breast: BI-RADS Assessment Category 1: Negative.    07/12/19 CT chest wo contrast  FINDINGS:  ??  CHEST WALL: Status post left mastectomy with implant reconstruction. Right  breast implant is also noted.   THYROID: Unchanged 1.5 cm low-attenuation right thyroid nodule.  MEDIASTINUM: Unchanged small calcified mediastinal lymph nodes.  HILA: Unchanged small calcified left hilar lymph nodes.  THORACIC AORTA: No aneurysm.  MAIN PULMONARY ARTERY: Normal in caliber.  TRACHEA/BRONCHI: Patent.  ESOPHAGUS: No wall thickening or dilatation.  HEART: Normal in size.  PLEURA: No pleural effusion or pneumothorax.  LUNGS: Unchanged subpleural scarring in the left upper lobe, compatible with  post radiation treatment changes. Previously seen nodular airspace disease in  the right upper lobe has improved, with individual nodules measuring up to 3 mm.  Unchanged calcified granuloma in the left upper lobe. No new pulmonary nodule or  airspace disease.  INCIDENTALLY IMAGED UPPER ABDOMEN: Calcified granulomata in the spleen.  BONES: No destructive bone lesion.  ??  IMPRESSION  IMPRESSION:  Previously seen nodular airspace disease in the right upper lobe is improved;  infectious bronchiolitis, such as MAI, is favored. No new pulmonary nodule or  airspace disease. Unchanged subpleural fibrosis in the anterior left upper lobe,  compatible with posttreatment changes. Granulomatous disease.    Lab Results  Lab Results   Component Value Date/Time    WBC 10.4 11/24/2018 05:15 AM    HGB 11.0 (L) 11/24/2018 05:15 AM    HCT 34.0 (L) 11/24/2018 05:15 AM    PLATELET 172 11/24/2018 05:15 AM    MCV 91.9 11/24/2018 05:15 AM       Lab Results   Component Value Date/Time    Sodium 138 11/26/2018 04:17 AM    Potassium 3.9 11/26/2018 04:17 AM    Chloride 107 11/26/2018 04:17 AM    CO2 25 11/26/2018 04:17 AM    Anion gap 6 11/26/2018 04:17 AM    Glucose 75 11/26/2018 04:17 AM    BUN 9 11/26/2018 04:17 AM    Creatinine 0.50 (L) 11/26/2018 04:17 AM    BUN/Creatinine ratio 18 11/26/2018 04:17 AM    GFR est AA >60 11/26/2018 04:17 AM    GFR est non-AA >60 11/26/2018 04:17 AM    Calcium 8.4 (L) 11/26/2018 04:17 AM    Alk.  phosphatase 88 11/25/2018 04:50 AM     Protein, total 5.8 (L) 11/25/2018 04:50 AM    Albumin 3.0 (L) 11/25/2018 04:50 AM    Globulin 2.8 11/25/2018 04:50 AM    A-G Ratio 1.1 11/25/2018 04:50 AM    ALT (SGPT) 122 (H) 11/25/2018 04:50 AM     Assessment/Plan:  68 y.o. female with a 0.5 cm left breast cancer, IDC with endocrine features, 1/14 LN +, gr 2, high risk mammaprint,  ER and PR +, HER 2 negative. PS 0    1. Breast cancer with neuroendocrine features, Left, lower inner quadrant, stage IIA, [id number]    With no further therapy, her RR is about 36%; endocrine therapy will decrease her RR to 19%; and 2nd gen chemo will decrease her RR to 12%; 3rd gen to 10%.  These #s may be higher with her high risk mammaprint.     Based on her pheo and now breast cancer history, I recommended genetic counseling -- referral to [facility] made at prior visit. Has seen them and they have performed BRCA testing -- negative.  [facility] has run a PGL panel on 09/10/14.    No evidence of recurrence, completed toremifene in 05/2018.       Breast MRI 09/09/19    Discussed again that tumor markers are NOT recommended per ASCO guidelines    2. Anxiety/Depression: Stable today; on cymbalta     3. Osteoporosis: Had received zometa previously, which was given for one dose on 07/10/13. Switched to prolia 60 mg SC q 6 months, received 04/08/14,10/01/14, 03/10/15, 09/14/15. She had one remaining Prolia injection, but she had oral surgery in July 2017, discontinued    DEXA 10/01/14 at the [facility] patient reports improvement. Repeat DEXA in 2017 showed stable osteoporosis.  dexa in 2018 showed improvement.  Repeat DEXA on 02/18/2018 showed worsening osteoporosis to left femoral neck and lumbar spine. This may have been due to stopping the prolia, as now I would not recommend stopping it unless another oral issue arises.    Currently taking Vit D3 2000 International Units daily.    Plan was to restart prolia 60 mg SC q 6 months in 03/2018, however she was  unable to do this due to insurance.  She restarted 10/15/2018.  Last dose was 04/12/19, due around 10/08/18. Order is in, she will call to make appointment    Will order dexa at next visit    4. Fibromyalgia/RA: Not currently on any treatment, previously discussed with patient that there are medical options to assist with her pain. She continues to follow up with her Rheumatologist every 6 weeks. She took MTX prior to cancer diagnosis which she did not tolerate.  Previously did not tolerate lyrica due to sedation.  She reports she has been receiving cortisone injections. She has also started Cymbalta which has helped.     5. Right thyroid nodule:  bx negative 05/16/19    6. Right ax nodule: saw Dr. Drucilla Schmidt, lipoma    7. Pulmonary nodules:  Radiation changes vs. Infectious bronchiolitis; saw a pulmonologist in [place]; AFB x 3 pending for MAI.  I will defer further imaging or testing to pulmonary    I was in the office while conducting this encounter.  The patient was at her home.    Consent:  She and/or her healthcare decision maker is aware that this patient-initiated Telehealth encounter is a billable service, with coverage as determined by her insurance carrier. She is aware that she may  receive a bill and has provided verbal consent to proceed: Yes    Pursuant to the emergency declaration under the [place] waiver authority and the [employer] Act, this Virtual  Visit was conducted, with patient's (and/or legal guardian's) consent, to reduce the patient's risk of exposure to COVID-19 and provide necessary medical care.     Services were provided through a video synchronous discussion virtually to substitute for in-person clinic visit.  Sonda Rumble, MD

## 2019-09-05 ENCOUNTER — Encounter: Payer: MEDICARE | Primary: Family Medicine

## 2019-09-06 ENCOUNTER — Encounter: Payer: MEDICARE | Primary: Family Medicine

## 2019-09-09 ENCOUNTER — Ambulatory Visit: Payer: MEDICARE | Primary: Family Medicine

## 2019-10-14 ENCOUNTER — Encounter: Payer: MEDICARE | Primary: Family Medicine

## 2019-10-18 ENCOUNTER — Ambulatory Visit: Payer: MEDICARE | Primary: Family Medicine

## 2019-10-18 ENCOUNTER — Encounter

## 2019-10-18 ENCOUNTER — Inpatient Hospital Stay: Admit: 2019-10-18 | Payer: MEDICARE | Attending: Critical Care Medicine | Primary: Family Medicine

## 2019-10-18 DIAGNOSIS — R0602 Shortness of breath: Secondary | ICD-10-CM

## 2019-10-18 NOTE — Telephone Encounter (Signed)
Patient went to see Dr. Gilford Rile at [facility] with right shoulder pain. 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 She stated it is very painful and is concerned about getting injection next week. Please advise and call patient back.    Cb # [phone]

## 2019-10-18 NOTE — Telephone Encounter (Signed)
Called patient to reschedule her Prolia. She stated that she wants to talk to dr. 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 And does not want to get it till she talks with him.  [id number]

## 2019-10-21 ENCOUNTER — Inpatient Hospital Stay: Admit: 2019-10-21 | Payer: MEDICARE | Primary: Family Medicine

## 2019-10-21 DIAGNOSIS — I972 Postmastectomy lymphedema syndrome: Secondary | ICD-10-CM

## 2019-10-21 NOTE — Progress Notes (Signed)
Progress Notes by Emily [name], Emily [name] at 10/21/19 0930                Author: Cletis [name]  Service: Physical Therapy  Author Type: Physical Therapist       Filed: 10/21/19 1225  Date of Service: 10/21/19 0930  Status: Signed          Editor: Emily [name], Emily [name] (Physical Therapist)                                                            [facility]   Lymphedema [facility]   [address]   [address] [place], [address]   [place], [place]  [address]      INITIAL EVALUATION      NAME: Emily [name] AGE : 69 y.o.  GENDER: female   DATE: 10/21/2019   REFERRING PHYSICIAN: Gust Brooms, NP     HISTORY AND BACKGROUND:     Primary Diagnosis:   ?  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  Cellulitis of left upper limb (L03.114)   ?  Swelling not relieved by elevation (R60.9)   ?  Unspecified lack of coordination (R27.9)   ?  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  Patient has been hospitalized twice for cellulitis.  She has not had any cellulitis  infections since she was last seen in therapy.  She wears compression daily and has day and night compression products.  She also has a Nature conservation officer that she is using about once a week.  Patient is concerned about recurrent infections and trying to prevent  them in the future.   She reports having fragile skin that she has cuts and injuries frequently, but she keeps them clean and covered.  Patient also reports significant chest and shoulder tightness with a history of axillary web  syndrome.  Her decreased  range of motion results in her having to perform self care activities in a modified manner due to not being able to easily reach her back or the back of her head.   [facility] Lymphedema Assessment Scale: 12/20.   Past Medical History:      Past Medical History:        Diagnosis  Date         ?  Anxiety       ?  Arthritis       ?  Basal cell carcinoma       ?  Cancer [facility])            left breast  cancer         ?  Chronic tension headaches       ?  Concussion  2011     ?  Headache(784.0)            OCCAS         ?  Hx of radiation therapy            COMPLETED IN 2014         ?  Ill-defined condition            cellulitis L UE         ?  Lymphedema of arm            left         ?  Nausea & vomiting       ?  Other and unspecified symptoms and [name] involving general sensations and perceptions            HIVES RECENTLY- WAS IN ER Feb 19, 2013         ?  Other ill-defined conditions(799.89)            PHEOCHROMOCYTOMA         ?  S/P chemotherapy, time since greater than 12 weeks            COMPLETED CHEMOTHERAPY IN JUNE 2014         ?  SCCA (squamous cell carcinoma) of skin            Past Surgical History:         Procedure  Laterality  Date          ?  ABDOMEN SURGERY PROC UNLISTED              hernia mesh repair          ?  ABDOMEN SURGERY PROC UNLISTED              right adenal removed          ?  BREAST SURGERY PROCEDURE UNLISTED    '91          breast implants; replaced bilat implants 2011          ?  HX APPENDECTOMY         ?  HX BREAST AUGMENTATION  Left  04/22/2014          REMOVAL OF LEFT BREAST IMPLANT, WASHOUT AND CLOSURE OF LEFT BREAST WOUND performed by Emily Bury, MD at [facility] MAIN OR          ?  HX BREAST RECONSTRUCTION    11/28/2012          BREAST RECONSTRUCTION /C  INSERTION EXPANDER & ALLODERM performed by Emily Bury, MD at [facility] AMBULATORY OR          ?  HX BREAST RECONSTRUCTION  Bilateral  09/16/2013          REMOVAL AND REPLACEMENT LEFT BREAST IMPLANT TISSUE  EXPANDERS WITH SUBTOTAL CAPSULECTOMY, RIGHT BREAST IMPLANT REMOVAL AND REPLACEMENT FOR SYMMETRY/FAT  GRAFTING TO BILATERAL BREASTS performed by Emily Bury, MD at [facility] MAIN OR          ?  HX GYN              c-section          ?  HX HEENT              T&A          ?  HX HEENT              right adrenal removed          ?  HX MASTECTOMY  Left       ?  HX MODIFIED RADICAL MASTECTOMY    11/28/2012  LEFT BREAST MODIFIED RADICAL MASTECTOMY WITH AXILLARY DISSECTION W/ RECONSTRUCTION LEFT BREAST WITH TISSUE EXPANDER AND ALLODERM;PORTACATH INSERTION  performed by Emily Rail, MD at [facility] AMBULATORY OR          ?  HX OOPHORECTOMY              right          ?  HX ORTHOPAEDIC              bilateral shoulder arthroscopy          ?  HX VASCULAR ACCESS              PORT, THEN REMOVED        Current Medications:  Patient does wear a mastectomy prosthetic.       Current Outpatient Medications        Medication  Sig         ?  MASTECTOMY PROSTHESIS misc  1 Each by Does Not Apply route daily.     ?  oxyCODONE IR (ROXICODONE) 5 mg immediate release tablet  Take 5 mg by mouth every six (6) hours as needed for Pain.     ?  acetaminophen (TYLENOL) 500 mg tablet  Take 500 mg by mouth every six (6) hours as needed for Pain.     ?  butalbital-acetaminophen-caff (FIORICET) 50-300-40 mg per capsule  Take 1 Cap by mouth every four (4) hours as needed for Headache.     ?  DULoxetine (CYMBALTA) 30 mg capsule  Take 30 mg by mouth two (2) times a day.         ?  zolpidem (AMBIEN) 10 mg tablet  TAKE 1 TABLET BY MOUTH AT BEDTIME          No current facility-administered medications for this encounter.         Allergies:      Allergies        Allergen  Reactions         ?  Aspirin  Unknown (comments)             Gi bleeding         ?  Bacitracin  Hives and Swelling     ?  Compazine [Prochlorperazine]  Rash     ?  Nsaids (Non-Steroidal Anti-Inflammatory Drug)  Swelling             Throat swells            Prior Level of  Function/Social/Work History/Personal factors and/or comorbidities impacting plan of care: Patient married and now resides primarily in [place] but travels periodically to [place] for  Medical care.  Has an adopted 28 year old son who is currently doing virtual schooling.  Husband is a retired Estate agent.   Living Situation: Resides in townhouse with significant other but spends most of the year in [place] Caregiver?: Not needed at this time   Mobility: Independent gait without any assistive device for community distances   Sleeping Arrangement:  in bed at night   Adaptive Equipment Owned: walker   Other: Patient is able to don compression garments      Previous Therapy:  Patient has been treated with complete decongestive therapy at [facility] and she was seen for an evaluation at [facility] on 12/17/2018.     Compression/Lymphedema Equipment: Juxo expert silver class 1 custom flat knit compression sleeve with silicone band,  hand pieces  including gloves and gauntlets, [employer] Tribute custom arm garment ordered in 2019, Flexitouch home vasopneumatic device        SUBJECTIVE:   Patient reports that her swelling is worse when she is in [place] and she only stays in [place] for short periods of time.  She feels like  when she returns to [place] especially in the winter, her swelling decreases.  Patient reports that the sleeve ordered for her in March fit her well and the length was much better.  She did not wear this sleeve today and she arrived wearing an older  custom sleeve.     Patients goals for therapy: decrease swelling and to be measured for new compression garments.      OBJECTIVE DATA SUMMARY:     EXAMINATION/PRESENTATION/DECISION MAKING:    Temperature:   97.2 degrees      Pain:  Patient reports a currently pain level of 3/10 numeric scale and she describes fullness and aching in the left lateral trunk,  axilla and upper arm.  Patient pain level increases to 8-9/10 numeric  scale by the end of the day especially when she is in [place].   Pain Scale 1: Numeric (0 - 10)   Pain Intensity 1: 3   Pain Location 1: Arm;Shoulder   Pain Orientation 1: Left   Pain Description 1: Aching;Heaviness      Self-Care and ADLs: Modified independent with increased time required.      Skin and Tissue Assessment:   Dermal Status: Patient has a couple bandaids in place to protect cracks due to dry skin on her left hand and she has a healing intact burn  on her left forearm.       Texture/Consistency: Boggy proximal forearm and distal upper arm      Pigmentation/Color Change: Normal      Anomalies: N/A      Circulatory: Vascular studies ruled out DVT in past      Nails: Normal      Stemmers Sign: Negative      Height:   5'6' Weight:        BMI:      (36 or greater: adversely affecting lymphedema)   Wound/Ulcer:  N/A         Volumetric Measurements:       Right:  1,562.42mL (increased by 7.54ml since last assessment 12/17/2018)  Left:  1,754.24mL (decreased by 60.43ml since last assessment 12/17/2018)        % Difference: 12.29% left larger than right versus 16.77% larger on 12/17/2018  Dominance: right     (See scanned graph)      Range of Motion: Limitations are noted in bilateral shoulders for shoulder  flexion and abduction.  Not formally assessed today.   Strength: Not formally assessed 2* patient is able to complete all functional  activities in the clinic today.   Sensation:  Intact    Palpation:  Axillary web cording is palpable in the left axilla and medial posterior aspect of left upper arm.    Mobility:     Bed/Chair Mobility: Independent   Transfers: Independent   Gait: Independent   Endurance: good       Safety:   Patient is alert and oriented: yes   Safety awareness: good   Fall risk?: low   Patient given written fall prevention handout: Yes       Based on the above components, the patient evaluation is determined to be of the following complexity level: LOW  Evaluation Time: 0935-1000 25  minutes        TREATMENT PROVIDED:     1.  Treatment description:  Therapeutic Exercise and Procedure:  Patient does  participate in either a walking or bike exercise program almost daily. Discussed increasing her arm movements with walking and encouraged deep abdominal breathing with her exercise activities.  Patient wears her compression garments with her exercise  activities   Treatment time:  N/A  Minutes: 0      2.  Treatment description:  Manual Therapy:  Patient is applying low pH Cerave lotion daily to left her arm and left upper quadrant and lotion is applied following  manual lymph drainage principles.  Patient is knowledgeable of [name] and symptoms of cellulitis and she reports she has not had any additional infections since she was last seen in therapy.  Patient is knowledgeable of how to care for skin injuries and  she arrived with band-aids in place on a couple of cuts located on left hand fingers.  Patient spends a significant amount of time in [place] and she reports the weather there exacerbates her swelling.  Reviewed her home management routine and she wears  her day and night compression products daily.  Her tribute night garment is reportedly in good condition but patient thinks she will want to order a new one next year.  Her compression sleeve is due for replacement and reports the sleeve that measured  in March fit her well and the length was good.  She requests to order a new sleeve in Juzo silver helastic.  Therapist proceeded to measure patient for a custom Juzo silver expert sleeve in compression class 1 and order will be placed with patient's preferred  vendor of [employer].  Discussed with patient the 30 day remake period upon garment delivery and that if she has any fit concerns upon delivery, she should notify therapist immediately.  Patient will be returning to [place] in 3 weeks so is  considering having the sleeve delivered to her home in [place]. Patient has no  questions about sleeve donning and doffing or sleeve wearing schedule.  Patient has a Flexitouch vasopneumatic device at home and she is only using it about 1 day a week.   She is encouraged to increase her frequency of use to 4-5 days a week for optimal swelling and pain management especially when she is in [place].  Patient reports that she does need to increase her use of this products and she does feel better when she  uses it.  Patient complains of axillary and left lateral trunk swelling and continued reports of axillary web symptoms.  Discussed with her that the Flexitouch will help with these symptoms as well.  Therapist also demonstrated a Media planner to patient which would provide compression to these problem areas in her lateral chest and upper trunk.  Patient liked the product and is interested in the [facility] which is pink.  Discussed the potential of day or night wearing  schedule with this product and measured patient for the bra.  Patient is sized for a Medium but she donned the large sample in the clinic and it fits well in the breast region but is a little loose at the distal band.  Patient is expressing that she prefers  the larger garment in the clinic.  Provided patient with a printout from [employer] on bra sizing and ordering as she does not want  to order this product until she assesses the fit on some the bras she has at home that she may by able to use  for compression.  Advised patient that she can self order this product.  Patient presents with axillary web syndrome and therapist performed soft tissue mobilizations for axillary web on the left axilla and upper arm.  Patient was placed in supine with  left arm supported on a pillow and abducted to 80 degrees.  Performed anterior and lateral trunk manual lymph drainage followed by soft tissue mobilizations as follows:  Space correction technique, cord tractioning techniques with dicem, and  twisty mobilization  with active elbow flexion and extension.  Cords are visible and palpable in the axilla and patient reports decreased pain and tightness s/p techniques today.  Educated patient via demonstration in how to perform self axillary web mobilizations using dicem  and provided her with dicem for home use. Patient would benefit from some additional visits for axillary web treatment but patient is returning to [place] in 3 weeks.  If schedule availability allows, she will be scheduled for follow-up treatment session.   She was unable to come in for the 2 appointments that were available and offered to her today.  Will call her if we have any cancellations.   Treatment time:  1000-1100  Minutes: 60        ASSESSMENT:     Emily [name] is a 69 y.o. female who presents with  left upper extremity stage 2 lymphedema secondary to a history of left breast cancer with mastectomy and reconstruction. Patient  has a history of recurrent cellulitis infections with the last infection having been in February.  Patient also presents with axillary web cording on the left side and pain is reported in the left upper quadrant.  Her condition is complicated by arthritis,  anxiety, and chronic headaches. Patient is motivated to improve her condition.  Patient continues to benefit from complete decongestive therapy (CDT) including: Manual lymphatic drainage (MLD) technique, Short-stretch textile bandages/compression system  to decongest limb and Kinesiotaping as appropriate.          This care is medically necessary due to the infection risk with lymphedema and to improve functional activities.  CDT is necessary to resolve swelling to allow patient  to return to wearing normal clothes and prevent worsening of symptoms, such as infections and/or hospitalizations. Patient will be independent with home program strategies to allow improved ADL ability and mobility and to allow patient to return to greatest  functional  independence.   Rehabilitation potential is considered to be Good.  Factors which may influence rehabilitation potential include patient is motivated to improve her status  and to try to prevent future infections. Patient primarily resides in [place] so she has limited time available to pursue additional treatment  when she is here in [place].  Patient will benefit from 1-2 physical therapy visits over 3-5 weeks to optimize improvement in these areas.        PLAN OF CARE:     Recommendations and Planned Interventions: Manual lymph drainage/decongestive therapy, Compression garment fitting/provision,  Self-care training, Education in skin care and lymphedema precautions and Self-MLD education per home program        GOALS   Short term goals   Time frame: 12/01/2019   1.  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   2.  Patient will be independent with don/doff of compression system and  use in order to prevent reaccumulation of fluid at discharge.   3.  Pt will receive properly fitted left upper extremity custom compression sleeve in order to provide optimal swelling management over time.        Patient has participated in goal setting and agrees to work toward plan of care.   Patient was instructed to call if questions or concerns arise.      Thank you for this referral.    Jodene Nam, MSPT, CLT-LANA    Time Calculation: 85 mins        TREATMENT PLAN EFFECTIVE DATES:     10/21/2019 TO 01/15/2020   I have read the above plan of care for [facility].  I certify the above prescribed services are required by this patient and are medically necessary.  The above plan of care has been developed in conjunction with the lymphedema/physical therapist.          Physician Signature: ____________________________________Date:______________                                           Emily Brooms, NP

## 2019-10-21 NOTE — Progress Notes (Signed)
[facility]  Lymphedema Clinic  [address], [address]  [place]  [address]    INITIAL EVALUATION    NAME: Emily [name] AGE: 69 y.o.  GENDER: female  DATE: 10/21/2019  REFERRING PHYSICIAN: Henriette Combs, NP  HISTORY AND BACKGROUND:   Primary Diagnosis:  ? 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 Cellulitis of left upper limb (L03.114)  ? Swelling not relieved by elevation (R60.9)  ? Unspecified lack of coordination (R27.9)  ? 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  Patient has been hospitalized twice for cellulitis.  She has not had any cellulitis infections since she was last seen in therapy.  She wears compression daily and has day and night compression products.  She also has a Cytogeneticist that she is using about once a week.  Patient is concerned about recurrent infections and trying to prevent them in the future.   She reports having fragile skin that she has cuts and injuries frequently, but she keeps them clean and covered.  Patient also reports significant chest and shoulder tightness with a history of axillary web syndrome.  Her decreased range of motion results in her having to perform self care activities in a modified manner due to not being able to easily reach her back or the back of her head.  [facility] Lymphedema Assessment Scale: 12/20.  Past Medical History:   Past Medical History:   Diagnosis Date   ??? Anxiety    ??? Arthritis    ??? Basal cell carcinoma     ??? Cancer ([place])     left breast  cancer   ??? Chronic tension headaches    ??? Concussion 2011   ??? Headache(784.0)     OCCAS   ??? Hx of radiation therapy     COMPLETED IN 2014   ??? Ill-defined condition     cellulitis L UE   ??? Lymphedema of arm     left   ??? Nausea & vomiting    ??? Other and unspecified symptoms and signs involving general sensations and perceptions     HIVES RECENTLY- WAS IN ER Feb 19, 2013   ??? Other ill-defined conditions(799.89)     PHEOCHROMOCYTOMA   ??? S/P chemotherapy, time since greater than 12 weeks     COMPLETED CHEMOTHERAPY IN JUNE 2014   ??? SCCA (squamous cell carcinoma) of skin      Past Surgical History:   Procedure Laterality Date   ??? ABDOMEN SURGERY PROC UNLISTED      hernia mesh repair   ??? ABDOMEN SURGERY PROC UNLISTED      right adenal removed   ??? BREAST SURGERY PROCEDURE UNLISTED  '91    breast implants; replaced bilat implants 2011   ??? HX APPENDECTOMY     ??? HX BREAST AUGMENTATION Left 04/22/2014    REMOVAL OF LEFT BREAST IMPLANT, WASHOUT AND CLOSURE OF LEFT BREAST WOUND performed by Sharlotte Alamo, MD at [place]   ??? HX BREAST RECONSTRUCTION  11/28/2012    BREAST RECONSTRUCTION /C  INSERTION EXPANDER & ALLODERM performed by Sharlotte Alamo, MD at [place]   ??? HX BREAST RECONSTRUCTION Bilateral 09/16/2013    REMOVAL AND REPLACEMENT LEFT BREAST IMPLANT TISSUE EXPANDERS WITH SUBTOTAL CAPSULECTOMY, RIGHT BREAST IMPLANT REMOVAL AND REPLACEMENT FOR SYMMETRY/FAT GRAFTING TO BILATERAL BREASTS performed by Sharlotte Alamo, MD at [place]   ??? HX GYN      c-section   ??? HX HEENT      T&A   ??? HX HEENT      right adrenal removed   ??? HX MASTECTOMY Left    ??? HX MODIFIED RADICAL MASTECTOMY  11/28/2012    LEFT BREAST MODIFIED RADICAL MASTECTOMY WITH AXILLARY DISSECTION W/ RECONSTRUCTION LEFT BREAST WITH TISSUE EXPANDER AND ALLODERM;PORTACATH INSERTION performed by Ty Hilts, MD at [place]   ??? HX OOPHORECTOMY      right   ??? HX ORTHOPAEDIC      bilateral shoulder arthroscopy    ??? HX VASCULAR ACCESS      PORT, THEN REMOVED     Current Medications:  Patient does wear a mastectomy prosthetic.    Current Outpatient Medications   Medication Sig   ??? MASTECTOMY PROSTHESIS misc 1 Each by Does Not Apply route daily.   ??? oxyCODONE IR (ROXICODONE) 5 mg immediate release tablet Take 5 mg by mouth every six (6) hours as needed for Pain.   ??? acetaminophen (TYLENOL) 500 mg tablet Take 500 mg by mouth every six (6) hours as needed for Pain.   ??? butalbital-acetaminophen-caff (FIORICET) 50-300-40 mg per capsule Take 1 Cap by mouth every four (4) hours as needed for Headache.   ??? DULoxetine (CYMBALTA) 30 mg capsule Take 30 mg by mouth two (2) times a day.   ??? zolpidem (AMBIEN) 10 mg tablet TAKE 1 TABLET BY MOUTH AT BEDTIME     No current facility-administered medications for this encounter.      Allergies:   Allergies   Allergen Reactions   ??? Aspirin Unknown (comments)     Gi bleeding   ??? Bacitracin Hives and Swelling   ??? Compazine [Prochlorperazine] Rash   ??? Nsaids (Non-Steroidal Anti-Inflammatory Drug) Swelling     Throat swells        Prior Level of Function/Social/Work History/Personal factors and/or comorbidities impacting plan of care: Patient married and now resides primarily in [place] but travels periodically to [place] for Medical care.  Has an adopted 12 year old son who is currently doing virtual schooling.  Husband is a retired Software engineer.  Living Situation: Resides in townhouse with significant other but spends most of the year in [place]?: Not needed at this time  Mobility: Independent gait without any assistive device for community distances  Sleeping Arrangement:  in bed at night  Adaptive Equipment Owned: walker  Other: Patient is able to don compression garments    Previous Therapy:  Patient has been treated with complete decongestive therapy at [facility] and she was seen for an evaluation at [facility] on 12/17/2018.    Compression/Lymphedema Equipment: Juxo expert silver class 1 custom flat knit compression sleeve with silicone band, hand pieces including gloves and gauntlets, [employer] Tribute custom arm garment ordered in 2019, Flexitouch home vasopneumatic device    SUBJECTIVE:   Patient reports that her swelling is worse when she is in [place] and she only stays in [place] for short periods of time.  She feels like when she returns to  [place] especially in the winter, her swelling decreases.  Patient reports that the sleeve ordered for her in March fit her well and the length was much better.  She did not wear this sleeve today and she arrived wearing an older custom sleeve.   Patient???s goals for therapy: decrease swelling and to be measured for new compression garments.   OBJECTIVE DATA SUMMARY:   EXAMINATION/PRESENTATION/DECISION MAKING:   Temperature:   97.2 degrees    Pain:  Patient reports a currently pain level of 3/10 numeric scale and she describes fullness and aching in the left lateral trunk, axilla and upper arm.  Patient pain level increases to 8-9/10 numeric scale by the end of the day especially when she is in [place].  Pain Scale 1: Numeric (0 - 10)  Pain Intensity 1: 3  Pain Location 1: Arm;Shoulder  Pain Orientation 1: Left  Pain Description 1: Aching;Heaviness    Self-Care and ADLs: Modified independent with increased time required.    Skin and Tissue Assessment:  Dermal Status: Patient has a couple bandaids in place to protect cracks due to dry skin on her left hand and she has a healing intact burn on her left forearm.     Texture/Consistency: Boggy proximal forearm and distal upper arm    Pigmentation/Color Change: Normal    Anomalies: N/A    Circulatory: Vascular studies ruled out DVT in past    Nails: Normal    Stemmers Sign: Negative    Height:   5'6' Weight:      BMI:     (36 or greater: adversely affecting lymphedema)  Wound/Ulcer:  N/A        Volumetric Measurements:    Right:  1,562.33mL (increased by 7.31ml since last assessment 12/17/2018) Left:  1,754.33mL (decreased by 60.9ml since last assessment 12/17/2018)   % Difference: 12.29% left larger than right versus 16.77% larger on 12/17/2018 Dominance: right   (See scanned graph)    Range of Motion: Limitations are noted in bilateral shoulders for shoulder flexion and abduction.  Not formally assessed today.  Strength: Not formally assessed 2* patient is able to complete all functional activities in the clinic today.  Sensation:  Intact   Palpation:  Axillary web cording is palpable in the left axilla and medial posterior aspect of left upper arm.   Mobility:    Bed/Chair Mobility: Independent  Transfers: Independent  Gait: Independent  Endurance: good     Safety:  Patient is alert and oriented: yes  Safety awareness: good  Fall risk?: low  Patient given written fall prevention handout: Yes     Based on the above components, the patient evaluation is determined to be of the following complexity level: LOW     Evaluation Time: 0935-1000 25 minutes    TREATMENT PROVIDED:   1.  Treatment description:  Therapeutic Exercise and Procedure:  Patient does participate in either a walking or bike exercise program almost daily. Discussed increasing her arm movements with walking and encouraged deep abdominal breathing with her exercise activities.  Patient wears her compression garments with her exercise activities  Treatment time:  N/A  Minutes: 0     2.  Treatment description:  Manual Therapy:  Patient is applying low pH Cerave lotion daily to left her arm and left upper quadrant and lotion is applied following manual lymph drainage principles.  Patient is knowledgeable of signs and symptoms of cellulitis and she reports she has not had any additional infections since she was last seen  in therapy.  Patient is knowledgeable of how to care for skin injuries and she arrived with band-aids in place on a couple of cuts located on left hand fingers.  Patient spends a significant amount of time in [place] and she reports the weather there exacerbates her swelling.  Reviewed her home management routine and she wears her day and night compression products daily.  Her tribute night garment is reportedly in good condition but patient thinks she will want to order a new one next year.  Her compression sleeve is due for replacement and reports the sleeve that measured in March fit her well and the length was good.  She requests to order a new sleeve in Juzo silver helastic.  Therapist proceeded to measure patient for a custom Juzo silver expert sleeve in compression class 1 and order will be placed with patient's preferred vendor of [employer] compression.  Discussed with patient the 30 day remake period upon garment delivery and that if she has any fit concerns upon delivery, she should notify therapist immediately.  Patient will be returning to [place] in 3 weeks so is considering having the sleeve delivered to her home in [place]. Patient has no questions about sleeve donning and doffing or sleeve wearing schedule.  Patient has a Flexitouch vasopneumatic device at home and she is only using it about 1 day a week.  She is encouraged to increase her frequency of use to 4-5 days a week for optimal swelling and pain management especially when she is in [place].  Patient reports that she does need to increase her use of this products and she does feel better when she uses it.   Patient complains of axillary and left lateral trunk swelling and continued reports of axillary web symptoms.  Discussed with her that the Flexitouch will help with these symptoms as well.  Therapist also demonstrated a Furniture conservator/restorer to patient which would provide compression to these problem areas in her lateral chest and upper trunk.  Patient liked the product and is interested in the [facility] which is pink.  Discussed the potential of day or night wearing schedule with this product and measured patient for the bra.  Patient is sized for a Medium but she donned the large sample in the clinic and it fits well in the breast region but is a little loose at the distal band.  Patient is expressing that she prefers the larger garment in the clinic.  Provided patient with a printout from [employer] compression on bra sizing and ordering as she does not want to order this product until she assesses the fit on some the bras she has at home that she may by able to use for compression.  Advised patient that she can self order this product.  Patient presents with axillary web syndrome and therapist performed soft tissue mobilizations for axillary web on the left axilla and upper arm.  Patient was placed in supine with left arm supported on a pillow and abducted to 80 degrees.  Performed anterior and lateral trunk manual lymph drainage followed by soft tissue mobilizations as follows:  Space correction technique, cord tractioning techniques with dicem, and twisty mobilization with active elbow flexion and extension.  Cords are visible and palpable in the axilla and patient reports decreased pain and tightness s/p techniques today.  Educated patient via demonstration in how to perform self axillary web mobilizations using dicem and provided her with dicem for home use.  Patient would benefit from some additional visits for axillary web treatment but patient is returning to [place] in 3  weeks.  If schedule availability allows, she will be scheduled for follow-up treatment session.  She was unable to come in for the 2 appointments that were available and offered to her today.  Will call her if we have any cancellations.  Treatment time:  1000-1100  Minutes: 60    ASSESSMENT:   KYLA FANTAUZZI is a 69 y.o. female who presents with  left upper extremity stage 2 lymphedema secondary to a history of left breast cancer with mastectomy and reconstruction. Patient has a history of recurrent cellulitis infections with the last infection having been in February.  Patient also presents with axillary web cording on the left side and pain is reported in the left upper quadrant.  Her condition is complicated by arthritis, anxiety, and chronic headaches. Patient is motivated to improve her condition.  Patient continues to benefit from complete decongestive therapy (CDT) including: Manual lymphatic drainage (MLD) technique, Short-stretch textile bandages/compression system to decongest limb and Kinesiotaping as appropriate.        This care is medically necessary due to the infection risk with lymphedema and to improve functional activities.  CDT is necessary to resolve swelling to allow patient to return to wearing normal clothes and prevent worsening of symptoms, such as infections and/or hospitalizations. Patient will be independent with home program strategies to allow improved ADL ability and mobility and to allow patient to return to greatest functional independence.  Rehabilitation potential is considered to be Good.  Factors which may influence rehabilitation potential include patient is motivated to improve her status and to try to prevent future infections. Patient primarily resides in [place] so she has limited time available to pursue additional treatment when she is here in [place].  Patient will benefit from 1-2 physical therapy visits over 3-5 weeks to optimize improvement in these areas.     PLAN OF CARE:   Recommendations and Planned Interventions: Manual lymph drainage/decongestive therapy, Compression garment fitting/provision, Self-care training, Education in skin care and lymphedema precautions and Self-MLD education per home program    GOALS  Short term goals  Time frame: 12/01/2019  1.  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  2.  Patient will be independent with don/doff of compression system and use in order to prevent reaccumulation of fluid at discharge.  3.  Pt will receive properly fitted left upper extremity custom compression sleeve in order to provide optimal swelling management over time.     Patient has participated in goal setting and agrees to work toward plan of care.  Patient was instructed to call if questions or concerns arise.    Thank you for this referral.   Rolan Bucco, MSPT, CLT-LANA   Time Calculation: 85 mins    TREATMENT PLAN EFFECTIVE DATES:   10/21/2019 TO 01/15/2020  I have read the above plan of care for [facility].  I certify the above prescribed services are required by this patient and are medically necessary.  The above plan of care has been developed in conjunction with the lymphedema/physical therapist.       Physician Signature: ____________________________________Date:______________                                          Henriette Combs,  NP

## 2019-10-23 ENCOUNTER — Inpatient Hospital Stay: Admit: 2019-10-23 | Payer: MEDICARE | Attending: Nurse Practitioner | Primary: Family Medicine

## 2019-10-23 DIAGNOSIS — C50312 Malignant neoplasm of lower-inner quadrant of left female breast: Secondary | ICD-10-CM

## 2019-10-23 LAB — AMB POC CREATININE
GFR African American: >60 mL/min/{1.73_m2} (ref >60)
GFR Non-African American: >60 mL/min/{1.73_m2} (ref >60)
POC Creatinine: 0.5 mg/dL — ABNORMAL LOW (ref 0.6–1.3)

## 2019-10-23 LAB — CREATININE, POC
Creatinine (POC): 0.5 mg/dL — ABNORMAL LOW (ref 0.6–1.3)
GFRAA, POC: >60 mL/min/{1.73_m2} (ref >60)
GFRNA, POC: >60 mL/min/{1.73_m2} (ref >60)

## 2019-10-23 MED ORDER — GADOBUTROL 7.5 MMOL/7.5 ML (1 MMOL/ML) IV
7.5 mmol/ mL (1 mmol/mL) | Freq: Once | INTRAVENOUS | Status: AC
Start: 2019-10-23 — End: 2019-10-23
  Administered 2019-10-23: 20:00:00 via INTRAVENOUS

## 2019-10-23 MED FILL — GADAVIST 7.5 MMOL/7.5 ML (1 MMOL/ML) INTRAVENOUS SOLUTION: 7.5 mmol/ mL (1 mmol/mL) | INTRAVENOUS | Qty: 7.5

## 2019-10-23 NOTE — Progress Notes (Signed)
10/24/2019 at 12:02 pm--I attempted to call the patient but she did not answer the phone so I left her a detailed message and stated that if she needed anything to give [place] a call back.

## 2019-10-23 NOTE — Progress Notes (Signed)
Please let her know this is benign.  Thanks!

## 2019-10-24 ENCOUNTER — Inpatient Hospital Stay: Payer: MEDICARE | Primary: Family Medicine

## 2019-10-24 NOTE — Progress Notes (Signed)
Please let her know this is benign.  Thanks!

## 2019-10-24 NOTE — Progress Notes (Signed)
10/24/2019 at 12:02 pm--I attempted to call the patient but she did not answer the phone so I left her a detailed message and stated that if she needed anything to give [place] a call back.

## 2019-10-25 ENCOUNTER — Ambulatory Visit: Payer: MEDICARE | Primary: Family Medicine

## 2019-12-09 ENCOUNTER — Encounter: Payer: MEDICARE | Primary: Family Medicine

## 2019-12-09 ENCOUNTER — Encounter: Primary: Family Medicine

## 2019-12-16 ENCOUNTER — Encounter: Payer: MEDICARE | Primary: Family Medicine

## 2019-12-16 NOTE — Progress Notes (Incomplete)
[facility]  Lymphedema Clinic  [address], [place]  [address]    LYMPHEDEMA THERAPY      12/16/2019:  ELLIETT WADHWANI was not seen on this date for physical therapy for the following reason(s):    Patient called to cancel the visit for the following reasons: Other possible Covid exposure and she is opting to quarantine.  She will reschedule when it is appropriate to do so.     Rolan Bucco, MSPT, CLT-LANA

## 2020-02-05 ENCOUNTER — Inpatient Hospital Stay: Admit: 2020-02-05 | Payer: MEDICARE | Primary: Family Medicine

## 2020-02-05 DIAGNOSIS — I972 Postmastectomy lymphedema syndrome: Secondary | ICD-10-CM

## 2020-02-05 NOTE — Progress Notes (Signed)
Progress Notes by Lynetta Mare, Marzella Schlein at 02/05/20 1400                Author: Cletis Athens  Service: Physical Therapy  Author Type: Physical Therapist       Filed: 02/05/20 1630  Date of Service: 02/05/20 1400  Status: Signed          Editor: Lynetta Mare, Marzella Schlein (Physical Therapist)                                                  [facility]   Lymphedema Clinic   [address]   [address], [address]   [place], [place]  [address]      LYMPHEDEMA THERAPY   VISIT: 2     []                    Daily note               [x]          progress note/reassessment with updated 90 day plan of care   NAME: Emily [name]   DATE: 02/05/2020      Patient is returning for her first treatment visit since she was evaluated.  Patient resides in [place] but performs her medical care in [place]. She had to return to [place] after her evaluation and has been unable to return to the clinic in [place]  until now. Her plan of care will need to be extended.     GOALS   Short/Long term goals   Time frame: 12/01/2019   1.  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 Additional review on these techniques is needed.  Extend goal to 04/01/2020   2.  Patient will be independent with don/doff of compression system and use in order to prevent reaccumulation of fluid at discharge. Goal met as patient demonstrated independence in the clinic  today.  Goal met 02/05/2020   3.  Pt will receive properly fitted left upper extremity custom compression sleeve in order to provide optimal swelling management over time.  Patient received one sleeve with good fit noted.  Additional orders are placed for her today.  Extend goal to 04/01/2020   New goal:   4.  Patient will demonstrate independence in lymphedema and axillary web home exercise programs to decrease swelling, decrease pain and improve her range of motion.   To be met 04/01/2020.        Medications:      Current Outpatient  Medications:    ?  MASTECTOMY PROSTHESIS misc, 1 Each by Does Not Apply route daily., Disp: 4 Each, Rfl: 1   ?  oxyCODONE IR (ROXICODONE) 5 mg immediate release tablet, Take 5 mg by mouth every six (6) hours as needed for Pain., Disp: , Rfl:    ?  acetaminophen (TYLENOL) 500 mg tablet, Take 500 mg by mouth every six (6) hours as needed for Pain., Disp: , Rfl:    ?  butalbital-acetaminophen-caff (FIORICET) 50-300-40 mg per capsule, Take 1 Cap by mouth every four (4) hours as needed for Headache., Disp: , Rfl:    ?  DULoxetine (CYMBALTA) 30 mg capsule, Take 30 mg by mouth two (2) times a day., Disp: , Rfl: 3   ?  zolpidem (AMBIEN) 10 mg tablet, TAKE 1 TABLET BY MOUTH AT BEDTIME, Disp: , Rfl: 3        SUBJECTIVE REPORT: My arm has been feeling a little bit better the past 2 weeks.  I also saw a massage therapist in [place] who worked on it some for  me.  I got my new sleeve and I like it and it fits well.  I might order another one.        Pain: Patient reports pain has been improved the past couple of weeks and she describes her pain as aching and tightness along with  heaviness in the left arm   Pain Scale 1: Numeric (0 - 10)   Pain Intensity 1: 5   Pain Location 1: Arm;Shoulder   Pain Orientation 1: Left   Pain Description 1: Aching;Heaviness;Tightness      Gait:  Independent gait without any assistive device for community distances   Transfers: Sit to stand with Independent using no assistive devices.    Fall risk: low      ADLs: Modified independent with bathing and dressing with increased time required.       Treatment Response: Patient received her custom compression sleeve and she wears her garment daily with reports of a good fit.      [facility] L[facility]:  deferred     TREATMENT AND OBJECTIVE DATA SUMMARY:           Therapeutic Exercise/Procedure  10 minutes     Treatment time: 1520-1530      Walking program  N/A     Gertie ball exercise program:  N/A     Stick exercise program:   N/A      Free exercises/ROM:  Patient provided with [employer] Axillary Web home exercise program and educated in the following exercises: Educated in sidelying pectoralis stretch x  10 repetitions with no hold time.  Patient redemonstrated with cues.    Handout provided.      Provided patient with the remedial lymphedema home program handout.     Home program:  Patient to perform daily to BID:   Skin care, Deep abdominal breathing, Exercise routine, Compression garments, Purchase necessary supplies and Handout provided.         Rationale:  Exercise will increase the lymph angiomotoricity and tissue pressure of the skin and thus decrease swelling.           Modalities  0 minutes     Treatment time: N/A      Vasopneumatic pump:  Vasopneumatic pump is in place for home use. Patient is using pump per recommendations at home.  Flexitouch           Manual Lymphatic Drainage (MLD)  80 minutes     Treatment time: 1400-1520      Patient/family education provided in self MLD.  Educated in the basics of manual lymph drainage previously.        Area to decongest:  UE: left       Axillary web cords continue to be palpable in the left axilla and left medial upper arm.        Sequence used and effectiveness:  Modifications were made to manual lymph drainage sequence to exclude cervical techniques secondary to patient's age.   Secondary sequence for upper extremity with trunk involvement.   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  performed space correction technique, tractioning technique with dicem and twisty  mobilization with active elbow  flexion and extension.  Performed manual lymph drainage pre and post performance of soft tissue mobilizations.        Skin/wound care/debridement:   Skin is 100% intact on the left upper extremity.     Applied multi-layer compression bandaging to:   N/A     Upper/Lower extremity compression:  Fitted for and Measured for the following compression  products:      UE: left left upper extremity: Arm sleeve      Style:    Flat-knit      Brand:   Juzo Expert      Type:   Custom:         Vendor:    [employer]      Education:   Agricultural consultant.   Return and reordering process (by bringing prior garments into the clinic). Patient is independent with compression garment donning and doffing.  Reviewed color options for the sleeve and patient is  requesting pink, tie dye pink and white and possibly a third sleeve of tie dyed arctic blue with white.      Patient/family demonstrated donning and doffing with independence     Kinesiotaping:   N/A        Girth/Volume measurement:  Reviewed results of volumetric measurements taken on evaluation.      -left upper 1,753.92 ml today compared to 1,754.19 ml on 10/21/2019.       -right upper  1,588.75 ml today compared to 1,562.14 ml on 10/21/2019.    Percent difference today is 10.40% as compared to 12.29% on evaluation with the left arm larger than the right..               ASSESSMENT:     Patient has not been since her initial evaluation because she had to return to her primary residence in [place] and has now returned to [place].   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  Her swelling remains stable and she received her custom silver compression sleeve with  good fit noted.   New garment orders will be placed for her today.  Patient will benefit from some additional treatment visits to focus on the left axillary web syndrome to decrease pain, decrease tightness and increase her mobility as well as educate  her in home management techniques.     Patient remains motivated and continues to do well in above home programs. Patient is eager to learn and improve on condition. Patient is making gains towards goals.      PLAN OF CARE:     Continue plan of care as established on evaluation.  and Will  extend plan of care until 04/09/2020.     Continue with manual lymph drainage, soft tissue mobilizations, therapeutic exercise, compression garment fitting and measuring and education in home programs.      Frequency:   Monthly for a total of 2-4 physical therapy visits     Next session will address:  Assess volume   Exercise      MLD   Garment fitting   Education           Other:    Vendor is [employer] compression           TOTAL TREATMENT  90 mins            Jodene Nam, MSPT, [employer]  TREATMENT PLAN EFFECTIVE DATES:      01/15/2020 TO 04/10/2020     I have read the above plan of care for [facility].  I certify the above prescribed services are required by this patient and are medically necessary.  The above plan of care has been developed  in conjunction with the lymphedema/physical therapist.    Physician Signature: ____________________________________________________                                                    Gust Brooms, NP   Date: _________________________________________________________________

## 2020-02-05 NOTE — Progress Notes (Signed)
Lymphedema Physical Therapy Discharge Summary documented 06/23/2020    Patient was seen for 2 visits with her last attended visit on 02/05/2020.  Patient's plan of care expired on 04/10/2020 and she is discharged from therapy at this time.  Goal 2 was met and goals 1, 3, and 4 are in progress.  Patient can return to therapy in the future with a new prescriptions.    Rolan Bucco, MSPT, CLT-LANA

## 2020-02-05 NOTE — Progress Notes (Signed)
[facility]  Lymphedema Clinic  [address], [address]  [place]  [address]    LYMPHEDEMA THERAPY  VISIT: 2    '[]'$                   Daily note               '[x]'$         progress note/reassessment with updated 90 day plan of care  NAME: Emily [name]  DATE: 02/05/2020    Patient is returning for her first treatment visit since she was evaluated.  Patient resides in [place] but performs her medical care in [place]. She had to return to [place] after her evaluation and has been unable to return to the clinic in [place] until now. Her plan of care will need to be extended.  GOALS  Short/Long term goals  Time frame: 12/01/2019  1.  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 Additional review on these techniques is needed.  Extend goal to 04/01/2020  2.  Patient will be independent with don/doff of compression system and use in order to prevent reaccumulation of fluid at discharge. Goal met as patient demonstrated independence in the clinic today.  Goal met 02/05/2020  3.  Pt will receive properly fitted left upper extremity custom compression sleeve in order to provide optimal swelling management over time.  Patient received one sleeve with good fit noted. Additional orders are placed for her today.  Extend goal to 04/01/2020  New goal:  4.  Patient will demonstrate independence in lymphedema and axillary web home exercise programs to decrease swelling, decrease pain and improve her range of motion.  To be met 04/01/2020.     Medications:    Current Outpatient Medications:   ???  MASTECTOMY PROSTHESIS misc, 1 Each by Does Not Apply route daily., Disp: 4 Each, Rfl: 1  ???  oxyCODONE IR (ROXICODONE) 5 mg immediate release tablet, Take 5 mg by mouth every six (6) hours as needed for Pain., Disp: , Rfl:   ???  acetaminophen (TYLENOL) 500 mg tablet, Take 500 mg by mouth every six (6) hours as needed for Pain., Disp: , Rfl:   ???   butalbital-acetaminophen-caff (FIORICET) 50-300-40 mg per capsule, Take 1 Cap by mouth every four (4) hours as needed for Headache., Disp: , Rfl:   ???  DULoxetine (CYMBALTA) 30 mg capsule, Take 30 mg by mouth two (2) times a day., Disp: , Rfl: 3  ???  zolpidem (AMBIEN) 10 mg tablet, TAKE 1 TABLET BY MOUTH AT BEDTIME, Disp: , Rfl: 3    SUBJECTIVE REPORT: My arm has been feeling a little bit better the past 2 weeks.  I also saw a massage therapist in [place] who worked on it some for me.  I got my new sleeve and I like it and it fits well.  I might order another one.     Pain: Patient reports pain has been improved the past couple of weeks and she describes her pain as aching and tightness along with heaviness in the left arm  Pain Scale 1: Numeric (0 - 10)  Pain Intensity 1: 5  Pain Location 1: Arm;Shoulder  Pain Orientation 1: Left  Pain Description 1: Aching;Heaviness;Tightness    Gait:  Independent gait without any assistive device for community distances  Transfers: Sit to stand with Independent using no assistive devices.   Fall risk: low  ADLs: Modified independent with bathing and dressing with increased time required.     Treatment Response: Patient received her custom compression sleeve and she wears her garment daily with reports of a good fit.    [facility] Lymphedema Assessment Scale:  deferred  TREATMENT AND OBJECTIVE DATA SUMMARY:     Therapeutic Exercise/Procedure 10 minutes   Treatment time: 1520-1530  Walking program N/A   Gertie ball exercise program: N/A   Stick exercise program: N/A    Free exercises/ROM: Patient provided with [employer] Axillary Web home exercise program and educated in the following exercises: Educated in sidelying pectoralis stretch x 10 repetitions with no hold time.  Patient redemonstrated with cues.   Handout provided.     Provided patient with the remedial lymphedema home program handout.   Home program: Patient to perform daily to BID:  Skin care, Deep abdominal  breathing, Exercise routine, Compression garments, Purchase necessary supplies and Handout provided.    Rationale: Exercise will increase the lymph angiomotoricity and tissue pressure of the skin and thus decrease swelling.     Modalities 0 minutes   Treatment time: N/A  Vasopneumatic pump: Vasopneumatic pump is in place for home use. Patient is using pump per recommendations at home. Flexitouch     Manual Lymphatic Drainage (MLD) 80 minutes   Treatment time: 0254-2706  Patient/family education provided in self MLD. Educated in the basics of manual lymph drainage previously.   Area to decongest: UE: left     Axillary web cords continue to be palpable in the left axilla and left medial upper arm.   Sequence used and effectiveness: Modifications were made to manual lymph drainage sequence to exclude cervical techniques secondary to patient's age.  Secondary sequence for upper extremity with trunk involvement.  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  performed space correction technique, tractioning technique with dicem and twisty mobilization with active elbow flexion and extension.  Performed manual lymph drainage pre and post performance of soft tissue mobilizations.   Skin/wound care/debridement:  Skin is 100% intact on the left upper extremity.   Applied multi-layer compression bandaging to:  N/A   Upper/Lower extremity compression: Fitted for and Measured for the following compression products:    UE: left left upper extremity: Arm sleeve    Style:   Flat-knit    Brand:  [employer] Expert    Type:  Custom:       Vendor:   [employer]    Education:  Chief Strategy Officer.  Return and reordering process (by bringing prior garments into the clinic). Patient is independent with compression garment donning and doffing.  Reviewed color options for the sleeve and patient is requesting pink, tie dye pink and white and possibly a third sleeve of tie dyed arctic blue with  white.    Patient/family demonstrated donning and doffing with independence   Kinesiotaping:  N/A   Girth/Volume measurement: Reviewed results of volumetric measurements taken on evaluation.    -left upper 1,753.92 ml today compared to 1,754.19 ml on 10/21/2019.     -right upper  1,588.75 ml today compared to 1,562.14 ml on 10/21/2019.   Percent difference today is 10.40% as compared to 12.29% on evaluation with the left arm larger than the right..         ASSESSMENT:   Patient has not been since her initial evaluation because she had to return to her primary residence in [place] and has now returned to [place].  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  Her swelling remains stable and she received her custom silver compression sleeve with good fit noted.   New garment orders will be placed for her today.  Patient will benefit from some additional treatment visits to focus on the left axillary web syndrome to decrease pain, decrease tightness and increase her mobility as well as educate her in home management techniques.    Patient remains motivated and continues to do well in above home programs. Patient is eager to learn and improve on condition. Patient is making gains towards goals.   PLAN OF CARE:   Continue plan of care as established on evaluation.  and Will extend plan of care until 04/09/2020.   Continue with manual lymph drainage, soft tissue mobilizations, therapeutic exercise, compression garment fitting and measuring and education in home programs.  Frequency:  Monthly for a total of 2-4 physical therapy visits   Next session will address: Assess volume  Exercise     MLD  Garment fitting  Education      Other:   Vendor is [employer] compression     TOTAL TREATMENT 90 mins        Rolan Bucco, MSPT, CLT-LANA    TREATMENT PLAN EFFECTIVE DATES:    01/15/2020 TO 04/10/2020   I have read the  above plan of care for [employer].  I certify the above prescribed services are required by this patient and are medically necessary.  The above plan of care has been developed in conjunction with the lymphedema/physical therapist.   Physician Signature: ____________________________________________________                                                   Henriette Combs, NP  Date: _________________________________________________________________

## 2020-03-19 ENCOUNTER — Encounter: Payer: MEDICARE | Primary: Family Medicine

## 2020-04-13 ENCOUNTER — Inpatient Hospital Stay: Payer: MEDICARE | Primary: Family Medicine

## 2020-04-13 NOTE — Telephone Encounter (Signed)
04/13/2020--This user called and spoke with the patient and let her know our recommendation's. Patient verbalized understanding and denied any further question's or concerns at that time and stated that she has already gotten her Dexa scan 3 weeks ago in [place] and will email those results to [place] (Via my work email) and also stated that she has not been getting her Prolia injection's due to Dental issues but is now scheduled in August to get that done and wanted [place] to be aware. Patient denied any further question's or concerns at that time.

## 2020-04-13 NOTE — Telephone Encounter (Signed)
Patient stated that her PCP would like to order some tests and blood work, but would like to know when [name] would be ordering a bone scan, and pet scan. Patient would also like to know if she can get her Breast MRI with dye contrast in August. She states that she has been losing weight and not feeling well. CB# [phone]

## 2020-04-15 ENCOUNTER — Encounter: Payer: MEDICARE | Primary: Family Medicine

## 2020-04-15 NOTE — Telephone Encounter (Signed)
Lm with patient returning her call about rescheduling her appointment. Gave her office number to call back.

## 2020-04-15 NOTE — Telephone Encounter (Signed)
Patient called and stated she is flying in from [place] and has an appointment with [name] on 09/07/20, she would like to have her MRI done the same day at [facility] center. She asked if the order can go ahead and be put in. Please advise and call patient back when the order is placed.    CB # [phone]

## 2020-04-16 ENCOUNTER — Encounter

## 2020-04-17 NOTE — Telephone Encounter (Signed)
04/17/20 1:59 PM: [name] entered MRI order and patient is scheduled on 09/08/20.

## 2020-05-22 ENCOUNTER — Inpatient Hospital Stay: Admit: 2020-05-22 | Payer: MEDICARE | Primary: Family Medicine

## 2020-05-22 DIAGNOSIS — C50312 Malignant neoplasm of lower-inner quadrant of left female breast: Secondary | ICD-10-CM

## 2020-05-22 LAB — POC CHEM8
ANION GAP ISTAT,IAGAP: 11 mmol/L (ref 10–20)
Anion gap (POC): 11 mmol/L (ref 10–20)
CHLORIDE ISTAT,ICL: 100 mmol/L (ref 98–107)
CO2 (POC): 28 mmol/L (ref 21–32)
Calcium, ionized (POC): 1.15 mmol/L (ref 1.12–1.32)
Calcium, ionized, POC: 1.15 mmol/L (ref 1.12–1.32)
Chloride (POC): 100 mmol/L (ref 98–107)
Creatinine (POC): 0.5 mg/dL — ABNORMAL LOW (ref 0.6–1.3)
GFR African American: >60 mL/min/{1.73_m2} (ref >60)
GFR Non-African American: >60 mL/min/{1.73_m2} (ref >60)
GFRAA, POC: >60 mL/min/{1.73_m2} (ref >60)
GFRNA, POC: >60 mL/min/{1.73_m2} (ref >60)
GLUCOSE ISTAT,IGLU: 79 mg/dL (ref 65–100)
Glucose (POC): 79 mg/dL (ref 65–100)
POC Creatinine: 0.5 mg/dL — ABNORMAL LOW (ref 0.6–1.3)
POTASSIUM ISTAT,IK: 4 mmol/L (ref 3.5–5.1)
Potassium (POC): 4 mmol/L (ref 3.5–5.1)
SODIUM ISTAT,INA: 138 mmol/L (ref 136–145)
Sodium (POC): 138 mmol/L (ref 136–145)
TCO2 ISTAT,ITCO2: 28 mmol/L (ref 21–32)

## 2020-05-22 LAB — PHOSPHORUS
Phosphorus: 4.1 MG/DL (ref 2.6–4.7)
Phosphorus: 4.1 MG/DL (ref 2.6–4.7)

## 2020-05-22 LAB — MAGNESIUM
Magnesium: 2.1 mg/dL (ref 1.6–2.4)
Magnesium: 2.1 mg/dL (ref 1.6–2.4)

## 2020-05-22 MED ORDER — DENOSUMAB 60 MG/ML SUB-Q SYRINGE
60 mg/mL | Freq: Once | SUBCUTANEOUS | Status: AC
Start: 2020-05-22 — End: 2020-05-22
  Administered 2020-05-22: 15:00:00 via SUBCUTANEOUS

## 2020-05-22 MED FILL — PROLIA 60 MG/ML SUBCUTANEOUS SYRINGE: 60 mg/mL | SUBCUTANEOUS | Qty: 1

## 2020-05-22 NOTE — Progress Notes (Signed)
 Outpatient Infusion Center Short Visit Progress Note    1000 Patient admitted to [facility] for  Prolia  injection ambulatory in stable condition. Assessment completed. No new concerns voiced.        Vital Signs:  Visit Vitals  BP (P) 133/86   Pulse (P) 66   Temp (P) 96.9 F (36.1 C)   Resp (P) 18   Ht (P) 5' 6 (1.676 m)   Wt (P) 54.2 kg (119 lb 6.4 oz)   BMI (P) 19.27 kg/m       Lab Results:  Recent Results (from the past 12 hour(s))   MAGNESIUM    Collection Time: 05/22/20 10:45 AM   Result Value Ref Range    Magnesium 2.1 1.6 - 2.4 mg/dL   PHOSPHORUS    Collection Time: 05/22/20 10:45 AM   Result Value Ref Range    Phosphorus 4.1 2.6 - 4.7 MG/DL   POC CHEM8    Collection Time: 05/22/20 10:49 AM   Result Value Ref Range    Calcium, ionized (POC) 1.15 1.12 - 1.32 mmol/L    Sodium (POC) 138 136 - 145 mmol/L    Potassium (POC) 4.0 3.5 - 5.1 mmol/L    Chloride (POC) 100 98 - 107 mmol/L    CO2 (POC) 28.0 21 - 32 mmol/L    Anion gap (POC) 11 10 - 20 mmol/L    Glucose (POC) 79 65 - 100 mg/dL    Creatinine (POC) 9.49 (L) 0.6 - 1.3 mg/dL    GFRAA, POC >39 >39 fo/fpw/8.26f7    GFRNA, POC >60 >60 ml/min/1.74m2    Comment Comment Not Indicated.           Medications:  Medications Administered     denosumab  (PROLIA ) injection 60 mg     Admin Date  05/22/2020 Action  Given Dose  60 mg Route  SubCUTAneous Administered By  Claudene Quale, RN                 Patient tolerated treatment well. Patient discharged from Outpatient [facility]fusion Center ambulatory in no distress.  Patient aware of next appointment on 09/07/20.    Quale Claudene, RN    Future Appointments   Date Time Provider Department Center   09/07/2020  8:45 AM Gust Ferdie LABOR, NP [facility] AMB   09/08/2020  2:00 PM [facility] MRI 1 [facility] [place]   09/09/2020  9:45 AM Marguerite Leontine Jon [facility]   11/20/2020 10:00 AM SS INF7 CH3 <1H [facility]

## 2020-07-14 NOTE — Telephone Encounter (Signed)
Patient called and stated that she needs an order put in for her bras [facility] to [facility]. Patient also stated she needs an order to be sent to the lymphedema clinic at [facility] for her night time compression garments. Please advise.     CB# [phone]

## 2020-07-14 NOTE — Telephone Encounter (Signed)
07/14/20 11:09 AM: [name] and spoke to [place], who stated that they will fax compression garment orders to this office for Dr. Keane Scrape, NP, to sign.

## 2020-08-25 ENCOUNTER — Encounter: Attending: Nurse Practitioner | Primary: Family Medicine

## 2020-09-07 ENCOUNTER — Ambulatory Visit: Attending: Nurse Practitioner

## 2020-09-07 ENCOUNTER — Ambulatory Visit: Admit: 2020-09-07 | Discharge: 2020-09-07 | Payer: MEDICARE | Attending: Nurse Practitioner | Primary: Family Medicine

## 2020-09-07 DIAGNOSIS — Z780 Asymptomatic menopausal state: Secondary | ICD-10-CM

## 2020-09-07 DIAGNOSIS — C50312 Malignant neoplasm of lower-inner quadrant of left female breast: Secondary | ICD-10-CM

## 2020-09-07 NOTE — Progress Notes (Signed)
Emily [name] is a 69 y.o. female Follow up for the evaluation of breast cancer.     1. Have you been to the ER, urgent care clinic since your last visit?  Hospitalized since your last visit? No    2. Have you seen or consulted any other health care providers outside of the [facility] since your last visit?  Include any pap smears or colon screening. No

## 2020-09-07 NOTE — Progress Notes (Signed)
[facility]  [address] [place]   [address]  W: [phone]   F: [phone]      f/u HEME/ONC CONSULT      Reason for Visit:   Emily [name] is a 69 y.o. female who is seen  for follow up of breast cancer      Consulting physician:  Dr. Gilford Rile, Dr. Raynald Blend, Brooke Bonito.  Ref physician:  Dr. Drucilla Schmidt    HPI:   Emily [name] is a 69 y.o.  female who I am seeing for management of breast cancer        She had a screening ultrasound on 09/24/12 showing a 2.6 cm left LN.  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  HER 2 negative IHC at 0.    She has a history of a pheochromocytoma surgically removed in 1997.    She then had a MRI breast on 10/05/12 showing the 2.8 x 2 cm LN as well as a left breast LIQ 0.9 cm x 1.1 cm x 0.6 cm mass.  Biopsy of that mass by FNA on 10/16/12 shows a poorly differentiated carcinoma.  These slides were air-dried, HER 2 not able to be performed on them.    Mammaprint of LN shows high risk luminal, ER + at 0.19, PR + at 0.61, HER 2 negative at -0.69.    She underwent L mastectomy on 11/28/12 showing IDC with neuroendocrine features, gr 2, 0.5 cm, DCIS gr 2-3, + LVI, 1/14 LN + with a 1 cm met, no ECE.  HER 2 negative (ratio 1.2, sig/cell 2.8), ER + 95%, PR + 5%; ki67 5%; DCIS ER + 95%, PR + 1%.    S/p TC q 3 weeks x 4 from 12/25/12-02/26/13 (75 mg/m2; 600 mg/m2)    S/p XRT from 04/02/13-05/10/13    Started anastrozole 05/17/13, stop 07/30/13 (joint pains)    exemestane 08/06/13-09/30/13 (joint pains)    Started zometa 07/10/13, q 6 months x 3 years, stopped     Started tamoxifen 09/30/13, stopped on 03/08/14 due to joint pains    Started letrozole 03/23/14, stopped 09/18/14 due to joint  pains    Started tamoxifen 10/04/14, stopped on 12/15/14 due to hot flashes and mood swings.    Anastrozole 12/16/14-01/30/15, stopped due to headache and dizziness    Toremefine 02/09/15-05/2018    Interval History:   She presents today for follow up. Reports gr 1 fatigue, gr 2 insomnia, pain rated 3/10 to left axilla, gr 2 headache, gr 1 decreased libido, gr 1 loss of libido.     She plans on moving to [place], recently bought house.      LMP 1996.    DX   Encounter Diagnosis   Name Primary?   ??? Malignant neoplasm of lower-inner quadrant of left breast in female, estrogen receptor positive ([place]) Yes      Past Medical History:   Diagnosis Date   ??? Anxiety    ??? Arthritis    ??? Basal cell carcinoma    ??? Cancer ([place])     left breast  cancer   ??? Chronic tension headaches    ??? Concussion 2011   ??? Headache(784.0)     OCCAS   ??? Hx of  radiation therapy     COMPLETED IN 2014   ??? Ill-defined condition     cellulitis L UE   ??? Lymphedema of arm     left   ??? Nausea & vomiting    ??? Other and unspecified symptoms and signs involving general sensations and perceptions     HIVES RECENTLY- WAS IN ER Feb 19, 2013   ??? Other ill-defined conditions(799.89)     PHEOCHROMOCYTOMA   ??? S/P chemotherapy, time since greater than 12 weeks     COMPLETED CHEMOTHERAPY IN JUNE 2014   ??? SCCA (squamous cell carcinoma) of skin      Past Surgical History:   Procedure Laterality Date   ??? HX APPENDECTOMY     ??? HX BREAST AUGMENTATION Left 04/22/2014    REMOVAL OF LEFT BREAST IMPLANT, WASHOUT AND CLOSURE OF LEFT BREAST WOUND performed by Sharlotte Alamo, MD at [place]   ??? HX BREAST RECONSTRUCTION  11/28/2012    BREAST RECONSTRUCTION /C  INSERTION EXPANDER & ALLODERM performed by Sharlotte Alamo, MD at [place]   ??? HX BREAST RECONSTRUCTION Bilateral 09/16/2013    REMOVAL AND REPLACEMENT LEFT BREAST IMPLANT TISSUE EXPANDERS WITH SUBTOTAL CAPSULECTOMY, RIGHT BREAST IMPLANT REMOVAL AND REPLACEMENT FOR SYMMETRY/FAT GRAFTING TO BILATERAL BREASTS performed by  Sharlotte Alamo, MD at [place]   ??? HX GYN      c-section   ??? HX HEENT      T&A   ??? HX HEENT      right adrenal removed   ??? HX MASTECTOMY Left    ??? HX MODIFIED RADICAL MASTECTOMY  11/28/2012    LEFT BREAST MODIFIED RADICAL MASTECTOMY WITH AXILLARY DISSECTION W/ RECONSTRUCTION LEFT BREAST WITH TISSUE EXPANDER AND ALLODERM;PORTACATH INSERTION performed by Ty Hilts, MD at [place]   ??? HX OOPHORECTOMY      right   ??? HX ORTHOPAEDIC      bilateral shoulder arthroscopy   ??? HX VASCULAR ACCESS      PORT, THEN REMOVED   ??? PR ABDOMEN SURGERY PROC UNLISTED      hernia mesh repair   ??? PR ABDOMEN SURGERY PROC UNLISTED      right adenal removed   ??? PR BREAST SURGERY PROCEDURE UNLISTED  '91    breast implants; replaced bilat implants 2011     Social History     Socioeconomic History   ??? Marital status: SINGLE   Tobacco Use   ??? Smoking status: Never Smoker   ??? Smokeless tobacco: Never Used   Substance and Sexual Activity   ??? Alcohol use: No   ??? Drug use: No     Family History   Problem Relation Age of Onset   ??? Alzheimer's Disease Mother    ??? Diabetes Mother    ??? Other Mother         COMBATIVE POST ANESTHESIA   ??? Heart Disease Father    ??? Diabetes Father    ??? Lung Disease Father         COPD   ??? Headache Sister    ??? Migraines Sister    ??? Anesth Problems Neg Hx        Current Outpatient Medications   Medication Sig Dispense Refill   ??? MASTECTOMY PROSTHESIS misc 1 Each by Does Not Apply route daily. 4 Each 1   ??? oxyCODONE IR (ROXICODONE) 5 mg immediate release tablet Take 5 mg by mouth every six (6) hours  as needed for Pain.     ??? acetaminophen (TYLENOL) 500 mg tablet Take 500 mg by mouth every six (6) hours as needed for Pain.     ??? butalbital-acetaminophen-caff (FIORICET) 50-300-40 mg per capsule Take 1 Cap by mouth every four (4) hours as needed for Headache.     ??? DULoxetine (CYMBALTA) 30 mg capsule Take 30 mg by mouth two (2) times a day.  3   ??? zolpidem (AMBIEN) 10 mg tablet TAKE 1 TABLET BY MOUTH AT  BEDTIME  3       Allergies   Allergen Reactions   ??? Aspirin Unknown (comments)     Gi bleeding   ??? Bacitracin Hives and Swelling   ??? Compazine [Prochlorperazine] Rash   ??? Nsaids (Non-Steroidal Anti-Inflammatory Drug) Swelling     Throat swells       Review of Systems    A comprehensive review of systems was performed and all systems were negative except for HPI       Objective:  Physical Exam:  Visit Vitals  BP 123/73   Pulse 74   Temp 98 ??F (36.7 ??C) (Temporal)   Resp 18   Ht 5' 6"  (1.676 m)   Wt 111 lb 12.8 oz (50.7 kg)   SpO2 98%   BMI 18.04 kg/m??         Constitutional: Appears well-developed and well-nourished in no apparent distress      Mental status: Alert and awake, Oriented to person/place/time, Able to follow commands    Eyes: EOM normal, Sclera normal, No visible ocular discharge    HENT: Normocephalic, atraumatic    Mouth/Throat: Moist mucous membranes    External Ears normal    Neck: No visualized mass    Pulmonary/Chest: Respiratory effort normal, No visualized signs of difficulty breathing or respiratory distress    Musculoskeletal: Normal gait with no signs of ataxia, Normal range of motion of neck    Neurological: No facial asymmetry (Cranial nerve 7 motor function), No gaze palsy    Skin: No significant exanthematous lesions or discoloration noted on facial skin    Psychiatric: Normal affect, No hallucinations                                                                   Diagnostic Imaging   10/12/12 PET:  Negative for distant mets  10/22/12 brain MRI:  Negative    09/24/12 dexa L hip T -2.5; L spine T -2.3  osteoporosis    12/11/13 bilateral MRI  Negative    03/26/14 MRI Brain  Negative    12/05/14 MRI breast   Negative    12/05/14 [place] L breast?? ????  IMPRESSION:  1. Very small, hypoechoic lesion in continuity and immediately below the ??  dermis overlying the sternum/medialmost left breast. Finding suggests dermal ??  lesion such as epidermal inclusion cyst versus postoperative change.  BI-RADS Category  2 - Benign findings. ??    01/01/16 breast MRI  Negative    08/22/16 Breast MRI  Negative    04/17/17 [place] right ext  IMPRESSION: Probable lipoma in the right axillary region. Given that this lesion is  palpable, clinical follow-up is recommended.     08/31/17 MRI breast  IMPRESSION:   1. No MRI  evidence of malignancy.  2. Bilateral silicone implants are intact.    11/23/18 ct chest wo contrast  IMPRESSION:  Postsurgical and postradiation changes of the thorax  Nodules appear to correspond to nodular pleural thickening  Nonspecific soft tissue induration in the left axilla. Please correlate with  clinical factors. If there is concern regarding vasculitis or thrombosis, then  Doppler ultrasound would be recommended.  Right thyroid nodule would be better evaluated by thyroid ultrasound.  Incidental granulomatous disease.    09/07/19 MRI breast  IMPRESSION:  ??  Right Breast:  1. BI-RADS Assessment Category 2: Benign finding. Submuscular silicone implant  which is intact. No complicating features.  2. No evidence of malignancy within the right breast.  3. No significant change.  ??  Left Breast:  1. BI-RADS Assessment Category 2: Benign finding. Stable and benign postsurgical  changes of mastectomy with submuscular silicone implant reconstruction. The  implant is intact.  2. No evidence of recurrent malignancy within the reconstructed left breast.  Specifically, no suspicious findings in the left axillary tail.  3. No significant change.  ??    08/31/18 MRI brain  IMPRESSION:   ??  1. No acute intracranial abnormality. No evidence of intracranial metastases.  2. Unchanged mild chronic microvascular ischemic disease.  3. Stable incidental pineal cyst.  ??  Assessment: ACR BI-RADS category   Left breast: BI-RADS Assessment Category 1: Negative.  Right breast: BI-RADS Assessment Category 1: Negative.    07/12/19 CT chest wo contrast  FINDINGS:  ??  CHEST WALL: Status post left mastectomy with implant reconstruction. Right  breast  implant is also noted.  THYROID: Unchanged 1.5 cm low-attenuation right thyroid nodule.  MEDIASTINUM: Unchanged small calcified mediastinal lymph nodes.  HILA: Unchanged small calcified left hilar lymph nodes.  THORACIC AORTA: No aneurysm.  MAIN PULMONARY ARTERY: Normal in caliber.  TRACHEA/BRONCHI: Patent.  ESOPHAGUS: No wall thickening or dilatation.  HEART: Normal in size.  PLEURA: No pleural effusion or pneumothorax.  LUNGS: Unchanged subpleural scarring in the left upper lobe, compatible with  post radiation treatment changes. Previously seen nodular airspace disease in  the right upper lobe has improved, with individual nodules measuring up to 3 mm.  Unchanged calcified granuloma in the left upper lobe. No new pulmonary nodule or  airspace disease.  INCIDENTALLY IMAGED UPPER ABDOMEN: Calcified granulomata in the spleen.  BONES: No destructive bone lesion.  ??  IMPRESSION  IMPRESSION:  Previously seen nodular airspace disease in the right upper lobe is improved;  infectious bronchiolitis, such as MAI, is favored. No new pulmonary nodule or  airspace disease. Unchanged subpleural fibrosis in the anterior left upper lobe,  compatible with posttreatment changes. Granulomatous disease.    Lab Results  Lab Results   Component Value Date/Time    WBC 10.4 11/24/2018 05:15 AM    HGB 11.0 (L) 11/24/2018 05:15 AM    HCT 34.0 (L) 11/24/2018 05:15 AM    PLATELET 172 11/24/2018 05:15 AM    MCV 91.9 11/24/2018 05:15 AM       Lab Results   Component Value Date/Time    Sodium 138 11/26/2018 04:17 AM    Potassium 3.9 11/26/2018 04:17 AM    Chloride 107 11/26/2018 04:17 AM    CO2 25 11/26/2018 04:17 AM    Anion gap 6 11/26/2018 04:17 AM    Glucose 75 11/26/2018 04:17 AM    BUN 9 11/26/2018 04:17 AM    Creatinine 0.50 (L) 11/26/2018 04:17 AM    BUN/Creatinine ratio 18  11/26/2018 04:17 AM    GFR est AA >60 11/26/2018 04:17 AM    GFR est non-AA >60 11/26/2018 04:17 AM    Calcium 8.4 (L) 11/26/2018 04:17 AM    Alk. phosphatase 88  11/25/2018 04:50 AM    Protein, total 5.8 (L) 11/25/2018 04:50 AM    Albumin 3.0 (L) 11/25/2018 04:50 AM    Globulin 2.8 11/25/2018 04:50 AM    A-G Ratio 1.1 11/25/2018 04:50 AM    ALT (SGPT) 122 (H) 11/25/2018 04:50 AM     Assessment/Plan:  69 y.o. female with a 0.5 cm left breast cancer, IDC with endocrine features, 1/14 LN +, gr 2, high risk mammaprint,  ER and PR +, HER 2 negative. PS 0    1. Breast cancer with neuroendocrine features, Left, lower inner quadrant, stage IIA, [id number]    With no further therapy, her RR is about 36%; endocrine therapy will decrease her RR to 19%; and 2nd gen chemo will decrease her RR to 12%; 3rd gen to 10%.  These #s may be higher with her high risk mammaprint.     Based on her pheo and now breast cancer history, I recommended genetic counseling -- referral to [facility] made at prior visit. Has seen them and they have performed BRCA testing -- negative.  [facility] has run a PGL panel on 09/10/14.    No evidence of recurrence, completed toremifene in 05/2018.       Breast MRI 09/09/19    Discussed again that tumor markers are NOT recommended per ASCO guidelines    Breast MRI tomorrow    2. Anxiety/Depression: Stable today; on cymbalta     3. Osteoporosis: Had received zometa previously, which was given for one dose on 07/10/13. Switched to prolia 60 mg SC q 6 months, received 04/08/14,10/01/14, 03/10/15, 09/14/15. She had one remaining Prolia injection, but she had oral surgery in July 2017, discontinued    DEXA 10/01/14 at the [facility], patient reports improvement. Repeat DEXA in 2017 showed stable osteoporosis.  dexa in 2018 showed improvement.  Repeat DEXA on 02/18/2018 showed worsening osteoporosis to left femoral neck and lumbar spine. This may have been due to stopping the prolia, as now I would not recommend stopping it unless another oral issue arises.    Currently taking Vit D3 2000 International Units daily.    Plan was to restart prolia 60 mg SC q 6 months in 03/2018,  however she was unable to do this due to insurance.  She restarted 10/15/2018.  Last dose was 05/22/20    dexa ordered    4. Fibromyalgia/RA: Not currently on any treatment, previously discussed with patient that there are medical options to assist with her pain. She continues to follow up with her Rheumatologist every 6 weeks. She took MTX prior to cancer diagnosis which she did not tolerate.  Previously did not tolerate lyrica due to sedation.  She reports she has been receiving cortisone injections. She is taking Cymbalta which has helped.     5. Right thyroid nodule:  bx negative 05/16/19    6. Right ax nodule: saw Dr. Drucilla Schmidt, lipoma    7. Pulmonary nodules:  Radiation changes vs. Infectious bronchiolitis; following with pulmonary    Sonda Rumble, MD

## 2020-09-07 NOTE — Telephone Encounter (Signed)
Called the [facility] at [phone] and left a message requesting a call back to verify that they received everything they needed for the patient.    Note:  Did not leave the patient's name on the message.  Also, 09/07/20 note faxed on 09/07/20 as requested.

## 2020-09-08 ENCOUNTER — Inpatient Hospital Stay: Payer: MEDICARE | Attending: Specialist | Primary: Family Medicine

## 2020-09-08 DIAGNOSIS — Z9189 Other specified personal risk factors, not elsewhere classified: Secondary | ICD-10-CM

## 2020-09-09 ENCOUNTER — Inpatient Hospital Stay: Admit: 2020-09-09 | Payer: MEDICARE | Primary: Family Medicine

## 2020-09-09 DIAGNOSIS — I972 Postmastectomy lymphedema syndrome: Secondary | ICD-10-CM

## 2020-09-09 NOTE — Progress Notes (Signed)
Progress Notes by Cristal Generous, Dionne Bucy at 09/09/20 0865                Author: Rosann Auerbach  Service: Physical Therapy  Author Type: Physical Therapist       Filed: 09/09/20 1301  Date of Service: 09/09/20 0751  Status: Signed           Editor: Cristal Generous, Dionne Bucy (Physical Therapist)  Cosigner: Judye Bos, NP at 09/10/20 1357                                                            [name]   [address], [place]  [address]      INITIAL EVALUATION      NAME: Emily [name] AGE : 69 y.o.  GENDER: female   DATE: 09/09/2020   REFERRING PHYSICIAN:  Jeanine Luz, NP     HISTORY AND BACKGROUND:     Primary Diagnosis:   ?  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  Cellulitis of left upper limb (L03.114)   ?  Swelling not relieved by elevation (R60.9)   ?  Unspecified lack of coordination (R27.9)   ?  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  Patient has a history of recurrent cellulitis infections with last infection in February 2020.  Patient has been hospitalized twice for cellulitis.  She has not had any cellulitis  infections since she was last seen in therapy.  She wears compression daily and has day and night compression products.  She also has a Cytogeneticist that she is using twice week.  Patient is concerned about recurrent infections and trying to prevent them  in the future.   She reports having fragile skin that she has cuts and injuries frequently, but she keeps them clean and covered.  Patient also reports significant chest and  shoulder tightness with a history of axillary web syndrome.  Her decreased  range of motion results in her having to perform self care activities in a modified manner due to not being able to easily reach her back or the back of her head. Patient reports residing in [place] adversely affects her lymphedema and medical status.   She reports her swelling significantly improved when she returned to [place]. Patient reports she is in need of a right shoulder replacement in the future.    Lymphedema Life Impact Scale:  Scores 29 out of 64 with 43% impairment   Past Medical History:      Past Medical History:        Diagnosis  Date         ?  Anxiety       ?  Arthritis       ?  Basal cell carcinoma       ?  Cancer ([facility])  left breast  cancer         ?  Chronic tension headaches       ?  Concussion  2011     ?  Headache(784.0)            OCCAS         ?  Hx of radiation therapy            COMPLETED IN 2014         ?  Ill-defined condition            cellulitis L UE         ?  Lymphedema of arm            left         ?  Nausea & vomiting       ?  Other and unspecified symptoms and signs involving general sensations and perceptions            HIVES RECENTLY- WAS IN ER Feb 19, 2013         ?  Other ill-defined conditions(799.89)            PHEOCHROMOCYTOMA         ?  S/P chemotherapy, time since greater than 12 weeks            COMPLETED CHEMOTHERAPY IN JUNE 2014         ?  SCCA (squamous cell carcinoma) of skin            Past Surgical History:         Procedure  Laterality  Date          ?  HX APPENDECTOMY         ?  HX BREAST AUGMENTATION  Left  04/22/2014          REMOVAL OF LEFT BREAST IMPLANT, WASHOUT AND CLOSURE OF LEFT BREAST WOUND performed by Sharlotte Alamo, MD at [facility]          ?  HX BREAST RECONSTRUCTION    11/28/2012          BREAST RECONSTRUCTION /C  INSERTION EXPANDER & ALLODERM performed by Sharlotte Alamo, MD at [facility]          ?  HX BREAST RECONSTRUCTION  Bilateral  09/16/2013           REMOVAL AND REPLACEMENT LEFT BREAST IMPLANT TISSUE EXPANDERS WITH SUBTOTAL CAPSULECTOMY, RIGHT BREAST IMPLANT REMOVAL AND REPLACEMENT FOR SYMMETRY/FAT  GRAFTING TO BILATERAL BREASTS performed by Sharlotte Alamo, MD at [facility]          ?  HX GYN              c-section          ?  HX HEENT              T&A          ?  HX HEENT              right adrenal removed          ?  HX MASTECTOMY  Left       ?  HX MODIFIED RADICAL MASTECTOMY    11/28/2012          LEFT BREAST MODIFIED RADICAL MASTECTOMY WITH AXILLARY DISSECTION W/ RECONSTRUCTION LEFT BREAST WITH TISSUE EXPANDER AND ALLODERM;PORTACATH INSERTION  performed by Ty Hilts, MD at [facility]          ?  HX OOPHORECTOMY              right          ?  HX ORTHOPAEDIC              bilateral shoulder arthroscopy          ?  HX VASCULAR ACCESS              PORT, THEN REMOVED          ?  PR ABDOMEN SURGERY PROC UNLISTED              hernia mesh repair          ?  PR ABDOMEN SURGERY PROC UNLISTED              right adenal removed          ?  PR BREAST SURGERY PROCEDURE UNLISTED    '91          breast implants; replaced bilat implants 2011        Current Medications:  Patient does wear a mastectomy prosthetic.       Current Outpatient Medications        Medication  Sig         ?  MASTECTOMY PROSTHESIS misc  1 Each by Does Not Apply route daily.         ?  oxyCODONE IR (ROXICODONE) 5 mg immediate release tablet  Take 5 mg by mouth every six (6) hours as needed for Pain.         ?  acetaminophen (TYLENOL) 500 mg tablet  Take 500 mg by mouth every six (6) hours as needed for Pain.     ?  butalbital-acetaminophen-caff (FIORICET) 50-300-40 mg per capsule  Take 1 Cap by mouth every four (4) hours as needed for Headache.     ?  DULoxetine (CYMBALTA) 30 mg capsule  Take 30 mg by mouth two (2) times a day.         ?  zolpidem (AMBIEN) 10 mg tablet  TAKE 1 TABLET BY MOUTH AT BEDTIME          No current facility-administered medications for this encounter.         Allergies:      Allergies        Allergen  Reactions         ?  Aspirin  Unknown (comments)             Gi bleeding         ?  Bacitracin  Hives and Swelling     ?  Compazine [Prochlorperazine]  Rash     ?  Nsaids (Non-Steroidal Anti-Inflammatory Drug)  Swelling             Throat swells            Prior Level of Function/Social/Work History/Personal factors and/or comorbidities impacting plan of care: Patient married and travels between [place] and [place] with most of her Medical care in [place]  and reports of plans to move back to [place] soon.  She reports that she recently signed for a new residence in [place] and will be moving back to [place] in January.   Has an adopted 14 year old son who is currently doing virtual schooling.  Husband  is a retired Software engineer.   Living Situation: Resides in townhouse with significant other but spends most of the year in [place]  Trainable Caregiver?: Not needed at this time   Mobility: Independent gait without any assistive device for community distances   Sleeping Arrangement:  in bed at night   Adaptive Equipment Owned: walker   Other: Patient is able to don compression garments      Previous Therapy:  Patient has been treated with complete decongestive therapy at [facility] and she was last seen at [name] on 10/21/2019 to 02/05/2020 .     Compression/Lymphedema Equipment: Juxo expert silver class 1 custom flat knit compression sleeve with silicone band,  hand pieces including gloves and gauntlets, [employer]is Tribute custom arm garment ordered in 2019, Flexitouch home vasopneumatic device         SUBJECTIVE:   Patient reports that her swelling is worse when she is in [place] and improves when she is in [place].  She states her arm was much bigger  last week when she was in [place] and that her sleeve is falling down her arm today due to the decreased size.   She reports she has gotten a new residence in [place] starting in January and she plans to  return to the area.  She requests a new sleeve  and new night garment today and reports ongoing axillary web symptoms.       Patients goals for therapy: decrease swelling, to be measured for new compression garments and and to work on the axillary web tightness.      OBJECTIVE DATA SUMMARY:     EXAMINATION/PRESENTATION/DECISION MAKING:       Pain:  Patient reports a currently pain level of 5/10 numeric scale and she describes tightness and aching in the left lateral trunk,  axilla and upper arm.  Patient pain level increases to 7/10 numeric scale by the end of the day and as the day progresses.  She also reports chronic right shoulder pain due to a past injury.   Pain Scale 1: Numeric (0 - 10)   Pain Intensity 1: 5   Pain Location 1: Arm; Shoulder   Pain Orientation 1: Left   Pain Description 1: Aching   Patient Stated Pain Goal: 0      Self-Care and ADLs: Modified independent with increased time required. She is unable to reach behind her to close her bra.      Skin and Tissue Assessment:   Dermal Status: Intact      Texture/Consistency: Boggy  forearm      Pigmentation/Color Change: Normal      Anomalies: N/A      Circulatory: Vascular studies ruled out DVT in past      Nails: Normal      Stemmers Sign: Negative      Height:   5'6' Weight:   Weight: 51.1 kg (112 lb 9.6 oz)      BMI:  BMI (calculated): 18.2   (36 or greater: adversely affecting lymphedema)   Wound/Ulcer:  N/A         Volumetric Measurements:       Right:  1,578.79 mL (decreased by 9.96  ml since last assessment 02/05/2020)  Left:1,683.76  mL (decreased by 70.33ml since last assessment 02/05/2020)        % Difference: 6.65% left larger than right versus 10.40% left larger right on 02/05/2020  Dominance: right     (See scanned graph)      Range of Motion:  Limitations are noted in bilateral shoulders for shoulder  flexion, abduction and internal rotation.  Not formally assessed  today.   Strength: Not formally assessed 2* patient is able to complete all  functional  activities in the clinic today.   Sensation:  Intact    Palpation:  Axillary web cording remains present in the left axilla and medial posterior aspect of left upper  arm with patient reporting symptoms distally into her forearm        Mobility:     Bed/Chair Mobility: Independent   Transfers: Independent   Gait: Independent   Endurance: good       Safety:   Patient is alert and oriented: yes   Safety awareness: good   Fall risk?: low   Patient given written fall prevention handout: Yes       Based on the above components, the patient evaluation is determined to be of the following complexity level: LOW       Evaluation Time: 1000-1020 20 minutes        TREATMENT PROVIDED:     1.  Treatment description:  Therapeutic Exercise and Procedure:  Patient does  participate in a walking program regularly. Patient reports performing upper extremity stretches daily.  She wears her compression garments with her exercise activities   Treatment time:  N/A  Minutes: 0      2.  Treatment description:  Manual Therapy:  Patient is applying low pH diabetic Cerave lotion daily to left her arm and left upper quadrant and lotion is applied  following manual lymph drainage principles.  Patient is knowledgeable of signs and symptoms of cellulitis and she reports she has not had any additional infections since she was last seen in therapy.  Patient is knowledgeable of how to care for skin injuries.  Reviewed results of reassessment today with decreased limb volume noted bilaterally.  Patient spends a significant amount of time in [place] and she reports the weather there exacerbates her swelling. She reports she is returning to [place].  Reviewed  her home management routine and she wears her day and night compression products daily.  She reports her tribute night garment is due to be replaced and she is wondering about other options for night use and pricing.  Her compression sleeve is due for  replacement and she reports the  last sleeve lengths were much better and she likes them longer like they were for that order. She requests to order a new sleeve in Juzo silver helastic.  Therapist proceeded to measure patient for a custom Juzo silver  expert sleeve in compression class 1 and order will be placed with patient's preferred vendor of [employer] compression.  Discussed with patient the 30 day remake period upon garment delivery. Followed previous lengths for this order.  Also discussed  and demonstrated Jobst Elvarex flat knit compression sleeve for patient per her questions about other garment options.  Patient may consider trying this sleeve on a future order but advised her to consider skin integrity, donning changes and how well  she adjusts to change as she has been using her current sleeve material for a long time.  Will address this when it is time to order a second set.    Patient has a Flexitouch vasopneumatic device at home and she is using it about 2 days a week.  Discussed  night compression garment options and her [place].  Also demonstrated the Jobst Relax for patient which is a little thinner and lighter and discussed this as an option. Patient really liked the Relax material and requests to order and try  this  product.  Educated patient in Insurance account manager and doffing, garment laundering and care, garment lifespan, and 30 day remake period.  Proceeded to measure patient for custom Jobst Relax sleeve to the MCP joints.   Requested patient return for a garment  fitting as this is a new product for her.  She verbalized understanding.  Patient continues to present with persistent axillary web syndrome, but time did not permit for therapist to perform soft tissue mobilizations today.  Patient to return in January  for garment fitting and soft tissue mobilizations.   Treatment time:  1020-1120   Minutes: 12     ASSESSMENT:     Emily [name] is a 69 y.o. female who presents with  left upper extremity stage 2  lymphedema secondary to a history of left breast cancer with mastectomy and reconstruction.   Patient has a history of recurrent cellulitis infections with the last infection having been in February of 2020.  Patient also presents with axillary web cording on the left side and pain is reported in the left upper quadrant extending distally into  the forearm. Her traveling between [place] and [place] has not allowed for consistent treatment in the clinic of the axillary web syndrome.  She would benefit from soft tissue mobilizations to improve her symptoms.   Her condition is complicated by  arthritis, anxiety, and chronic headaches. Patient is motivated to improve her condition.  Patient continues to benefit from complete decongestive therapy (CDT) including: Manual lymphatic drainage (MLD) technique, Short-stretch textile bandages/compression  system to decongest limb and Kinesiotaping as appropriate.          This care is medically necessary due to the infection risk with lymphedema and to improve functional activities.  CDT is necessary to resolve swelling to allow patient  to return to wearing normal clothes and prevent worsening of symptoms, such as infections and/or hospitalizations. Patient will be independent with home program strategies to allow improved ADL ability and mobility and to allow patient to return to greatest  functional independence.   Rehabilitation potential is considered to be Good.  Factors which may influence rehabilitation potential include patient is motivated to improve her status  and to try to prevent future infections. Patient travels between [place] and [place] and this has impacted her ability to attend therapy  in the past. Patient will benefit from 8-12 physical therapy visits over 10-12 weeks to optimize improvement in these areas.        PLAN OF CARE:     Recommendations and Planned Interventions: Manual lymph drainage/decongestive therapy, Compression garment  fitting/provision,  Self-care training, Education in skin care and lymphedema precautions and Self-MLD education per home program        GOALS   Short term goals   Time frame:     1.  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   2.  Patient will demonstrate independence in lymphedema home program of therapeutic exercises to improve circulation and decongest limb to improve ADLs.   3.  Pt will receive properly fitted left upper extremity custom compression sleeve in order to provide optimal swelling management over time.      Long Term goals   Time frame:    1. Patient will be independent with don/doff of compression system and use in order to prevent reaccumulation of fluid at discharge.   2.  Patient will no longer report axillary web related pain or tightness in her  left forearm with activity.   3. Pt will be independent in self-MLD and show stable limb volumes showing decongestion and pt. ready for transition to independent restorative  phase of lymphedema therapy.           Patient has participated in goal setting and agrees to work toward plan of care.   Patient was instructed to call if questions or concerns arise.      Thank you for this referral.    Rolan Bucco, MSPT, CLT-LANA    Time Calculation: 80 mins      Total timed codes: 60   1:1 Treatment time: 60     TREATMENT PLAN EFFECTIVE DATES:     09/09/2020 TO  12/04/2020   I have read the above plan of care for Emily [name].  I certify the above prescribed services are required by this patient and are medically necessary.  The above plan of care has been developed in conjunction with the lymphedema/physical therapist.          Physician Signature: ____________________________________Date:______________                                           Jeanine Luz, NP                  .ptop

## 2020-10-07 ENCOUNTER — Encounter: Primary: Family Medicine

## 2020-10-08 ENCOUNTER — Inpatient Hospital Stay: Admit: 2020-10-08 | Payer: MEDICARE | Attending: Specialist | Primary: Family Medicine

## 2020-10-08 DIAGNOSIS — Z9882 Breast implant status: Secondary | ICD-10-CM

## 2020-10-08 MED ORDER — GADOBUTROL 7.5 MMOL/7.5 ML (1 MMOL/ML) IV
7.5 mmol/ mL (1 mmol/mL) | Freq: Once | INTRAVENOUS | Status: AC
Start: 2020-10-08 — End: 2020-10-08
  Administered 2020-10-08: 16:00:00 via INTRAVENOUS

## 2020-10-08 MED FILL — GADAVIST 7.5 MMOL/7.5 ML (1 MMOL/ML) INTRAVENOUS SOLUTION: 7.5 mmol/ mL (1 mmol/mL) | INTRAVENOUS | Qty: 7.5

## 2020-10-09 NOTE — Telephone Encounter (Signed)
10/09/2020--This user attempted to call the patient but she did not answer, so a detailed message was left.         ----- Message from Joyce Gross, MD sent at 10/08/2020  5:08 PM EST -----  Please let she know this is normal

## 2020-10-12 ENCOUNTER — Inpatient Hospital Stay: Admit: 2020-10-12 | Payer: MEDICARE | Primary: Family Medicine

## 2020-10-12 DIAGNOSIS — I972 Postmastectomy lymphedema syndrome: Secondary | ICD-10-CM

## 2020-10-12 NOTE — Telephone Encounter (Signed)
Patient called and stated she would like a call back to discuss her MRI she had last week. Please advise     CB# [phone]

## 2020-10-12 NOTE — Progress Notes (Signed)
 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 Patient had just left the orthopedic surgeon's office so she did not have all of the details. Patient reports that she has had issues with her R shoulder for some time.   Subjective functional status/changes:   [x]  No changes reported  Patient reports that she will be having a shoulder surgery in February that will require her to be in a sling. Patient had just left the orthopedic surgeon's office so she did not have all of the details. Patient reports that she has had issues with her R shoulder for some time.   Patient received her compression garments that were ordered on evaluation, but she has not tried them on to date. Patient reports that she is concerned about how she will manage her garments after her shoulder surgery on the unaffected limb.   OBJECTIVE  18 min Therapeutic Exercise:  []  [employer] Exercises []  Remedial Lymphedema Exercise Program [x]  Axillary Web Exercise Program      [x]  Shoulder ROM Exercises (all focused on for the L UE as patient)   Rationale: Activate muscle pump to improve lymphatic fluid movement and decrease swelling to improve the patient's ability to perform ADL and IADL skills and prevent worsening of swelling  over time.    52 min Manual Therapy    Manual Lymphatic Drainage (MLD):  Area to decongest: UE: left   Sequence used and effectiveness: Modifications were made to manual lymph drainage sequence to exclude cervical techniques secondary to patient's age.  Secondary sequence for upper extremity with trunk involvement.  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    Skin/wound care/debridement: Reviewed skin care principles:   Skin care products  Hygiene  Prevention of cellulitis   Applied multi-layer compression bandaging to: Deferred    Upper/Lower Extremity Compression: Fitted for the following compression products:    UE: left left upper extremity: Arm sleeve    Style: Flat-knit and Foam based    Brand: Jobst  Juzo    Type: Custom: arm sleeve Juzo Expert Silver (Beige) with beaded silicone band and a Jobst Relax Custom night time garment.    Vendor: [employer]    Education: Educated patient in Ambulance person and doffing.  Glove use with donning.  Daily wear schedule.  Daily laundering.  Garment lifespan.  Return and reordering process (by bringing prior garments into the clinic).  Educated patient to monitor for redness or pressure points on the skin.  If new pain occurs they should contact their therapist.  Dycem for assist with donning the night time garment    Patient/family demonstrated donning and doffing  with independence    Patient is pleased with the initial garment fitting today and would like to proceed with second set, but encouraged her to wash and wear products for at least a week before calling to arrange a reorder with [employer] in case there is an issue.     Rationale: increase ROM and decrease edema  to improve the patient's ability to prevent worsening of swelling over time.       With   [x]  TE   []  TA   [x]  MT   []  SC   []  other: Patient Education: [x]  Review HEP    [x]  MLD Patient Education Reviewed  with patient, as well as demonstration and instruction during MLD portion of the session.   Continued education in self MLD technique with bathing and skin care.   Provided patient with the written instructions to follow.   Educated patient in soft tissue mobilization for axillary web.  Dycem provided.   [x]  Progressed/Changed HEP based on:   [x]  positioning   []  Kinesiotape   [x]  Skin care   []  wound care   [x]  other: Increase use of vasopneumatic pump as tolerated prior to surgery. Patient to ask surgeon about resuming use when appropriate post surgery.    [x]  Compression bandaging/garment precautions reviewed: [x]  Yes  []  No     Other Objective/Functional Measures:   Deferred for fitting of garments and education and MLD/Axillary Web Mobilization.     Pain Level (0-10 scale) post treatment: Pain not rated.       ASSESSMENT/Changes in Function:   Patient returned today to have her garments assessed and go over how she is managing her home program. Patient is continuing to be complaint with her stretches, exercise, garment use and utilizing her pump at least twice a week. Patient reports she will have an upcoming surgery on the R shoulder so encouraged her to adjust her stretches and exercises as needed and educated on tips for donning/doffing garments with assistive aids like dycem Patient would like to order a second set as soon as possible, so she will contact clinic or vendor directly if she is having no issues with the set that was fitted today. Patient reports that she will be having surgery in February, but is not sure of the details or date so she will contact the clinic if she needs to change the date of her next appointment.   Patient will continue to benefit from skilled PT services to  address ROM deficits, address swelling, analyze compression product fit and use, instruct in home lymphedema management program and modify and progress therapeutic interventions to attain remaining goals.     [x]   See  Plan of Care  []   See progress note/recertification  []   See Discharge Summary         Progress towards goals / Updated goals:    GOALS  Short term goals  Time frame:    1.  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 Progressing towards goal.   2.  Patient will demonstrate independence in lymphedema home program of therapeutic exercises to improve circulation and decongest limb to improve ADLs.Progressing towards goal.  3.  Pt will receive properly fitted left upper extremity custom compression sleeve in order to provide optimal swelling management over time. Goal Met 10/12/2020   Long Term goals 12/04/2020  Time frame:   1. Patient will be independent with don/doff of compression system  and use in order to prevent reaccumulation of fluid at discharge. Goal Met 10/12/2020  2.  Patient will no longer report axillary web related pain or tightness in her left forearm with activity. Progressing towards goal.   3. Pt will be independent in self-MLD and show stable limb volumes showing decongestion and pt. ready for transition to independent restorative phase of lymphedema therapy. Will assess next visit.       PLAN  []   Upgrade activities as tolerated     [x]   Continue plan of care  []   Update interventions per flow sheet       []   Discharge due to:_  []   Other:_      Robin C Patch, PTA,CLT 10/12/2020   Jon Pao, MSPT, CLT-LANA

## 2020-10-13 NOTE — Telephone Encounter (Signed)
[facility]  [phone]    10/13/2020--This user received a call from patient wanting to know her MRI results, I let her know that per Dr.Irvin they were normal. Patient verbalized understanding and did not have any further question's at that time.

## 2020-11-20 ENCOUNTER — Ambulatory Visit: Payer: MEDICARE | Primary: Family Medicine

## 2020-11-26 ENCOUNTER — Encounter: Payer: MEDICARE | Primary: Family Medicine

## 2020-12-01 ENCOUNTER — Inpatient Hospital Stay: Payer: MEDICARE | Primary: Family Medicine

## 2020-12-03 ENCOUNTER — Encounter

## 2020-12-03 ENCOUNTER — Encounter: Payer: MEDICARE | Primary: Family Medicine

## 2020-12-10 ENCOUNTER — Inpatient Hospital Stay: Admit: 2020-12-10 | Payer: MEDICARE | Primary: Family Medicine

## 2020-12-10 DIAGNOSIS — C50312 Malignant neoplasm of lower-inner quadrant of left female breast: Secondary | ICD-10-CM

## 2020-12-10 LAB — PHOSPHORUS
Phosphorus: 3.7 MG/DL (ref 2.6–4.7)
Phosphorus: 3.7 MG/DL (ref 2.6–4.7)

## 2020-12-10 LAB — POC CHEM8
ANION GAP ISTAT,IAGAP: 11 mmol/L (ref 10–20)
Anion gap (POC): 11 mmol/L (ref 10–20)
CHLORIDE ISTAT,ICL: 97 mmol/L — ABNORMAL LOW (ref 98–107)
CO2 (POC): 28.8 mmol/L (ref 21–32)
Calcium, ionized (POC): 1.21 mmol/L (ref 1.12–1.32)
Calcium, ionized, POC: 1.21 mmol/L (ref 1.12–1.32)
Chloride (POC): 97 mmol/L — ABNORMAL LOW (ref 98–107)
Creatinine (POC): 0.3 mg/dL — ABNORMAL LOW (ref 0.6–1.3)
GFR African American: >60 mL/min/{1.73_m2} (ref >60)
GFR Non-African American: >60 mL/min/{1.73_m2} (ref >60)
GFRAA, POC: >60 mL/min/{1.73_m2} (ref >60)
GFRNA, POC: >60 mL/min/{1.73_m2} (ref >60)
GLUCOSE ISTAT,IGLU: 83 mg/dL (ref 65–100)
Glucose (POC): 83 mg/dL (ref 65–100)
POC Creatinine: 0.3 mg/dL — ABNORMAL LOW (ref 0.6–1.3)
POTASSIUM ISTAT,IK: 4.2 mmol/L (ref 3.5–5.1)
Potassium (POC): 4.2 mmol/L (ref 3.5–5.1)
SODIUM ISTAT,INA: 136 mmol/L (ref 136–145)
Sodium (POC): 136 mmol/L (ref 136–145)
TCO2 ISTAT,ITCO2: 28.8 mmol/L (ref 21–32)

## 2020-12-10 LAB — MAGNESIUM
Magnesium: 2.2 mg/dL (ref 1.6–2.4)
Magnesium: 2.2 mg/dL (ref 1.6–2.4)

## 2020-12-10 MED ORDER — DENOSUMAB 60 MG/ML SUB-Q SYRINGE
60 mg/mL | Freq: Once | SUBCUTANEOUS | Status: AC
Start: 2020-12-10 — End: 2020-12-10
  Administered 2020-12-10: 17:00:00 via SUBCUTANEOUS

## 2020-12-10 MED FILL — PROLIA 60 MG/ML SUBCUTANEOUS SYRINGE: 60 mg/mL | SUBCUTANEOUS | Qty: 1

## 2020-12-10 NOTE — Progress Notes (Signed)
 Outpatient Infusion Center Short Visit Progress Note     1115 Patient admitted to [facility] for Prolia  ambulatory in stable condition. Assessment completed. No new concerns voiced.     Covid Screening      1.Do you have any symptoms of COVID-19?  SOB, coughing, fever, or generally not feeling well ? no  2. Have you been exposed to COVID-19 recently? no  3. Have you had any recent contact with family/friend that has a pending COVID test? no    Vital Signs:  Visit Vitals  BP 136/65   Pulse 78   Temp 97.7 F (36.5 C)   Resp 18   Ht 5' 6 (1.676 m)   Wt 53.8 kg (118 lb 11.2 oz)   SpO2 99%   Breastfeeding No   BMI 19.16 kg/m           Lab Results:  Recent Results (from the past 12 hour(s))   POC CHEM8    Collection Time: 12/10/20 11:26 AM   Result Value Ref Range    Calcium, ionized (POC) 1.21 1.12 - 1.32 mmol/L    Sodium (POC) 136 136 - 145 mmol/L    Potassium (POC) 4.2 3.5 - 5.1 mmol/L    Chloride (POC) 97 (L) 98 - 107 mmol/L    CO2 (POC) 28.8 21 - 32 mmol/L    Anion gap (POC) 11 10 - 20 mmol/L    Glucose (POC) 83 65 - 100 mg/dL    Creatinine (POC) 9.69 (L) 0.6 - 1.3 mg/dL    GFRAA, POC >39 >39 fo/fpw/8.26f7    GFRNA, POC >60 >60 ml/min/1.1m2    Comment Comment Not Indicated.           Pt denies dental work for 4 weeks prior or post tx.  Medications:  Medications Administered     denosumab  (PROLIA ) injection 60 mg     Admin Date  12/10/2020 Action  Given Dose  60 mg Route  SubCUTAneous Administered By  Reuel Railing, RN                [phone] Patient tolerated treatment well. Patient discharged from [facility] ambulatory in no distress at 1150. Patient aware of next appointment.    Future Appointments   Date Time Provider Department Center   06/18/2021 11:00 AM [facility] INF7 CH3 <1H [facility]   09/07/2021  9:15 AM Cohen, Ferdie LABOR, NP [facility]

## 2020-12-18 ENCOUNTER — Ambulatory Visit: Payer: MEDICARE | Primary: Family Medicine

## 2021-01-20 IMAGING — CT CT CHEST WITHOUT CONTRAST
2 of 4 series · 15 of 36 positions shown, 18 images · non-contrast
Comparison: None available

******** ADDENDUM #1 ********/n 
Prior outside chest CT from [place] [HOSPITAL] February 13, 2020 is obtained 
for comparison. The area of fibrosis along the anterior left upper lobe appears 
stable. Bronchiectasis is not significantly different. A small tree-in-bud 
opacities in the right upper lobe were present previously. There is no pleural 
effusion or airspace infiltrate on either examination. 
CT CHEST WITHOUT CONTRAST, 01/20/2021 [DATE]: 
CLINICAL INDICATION:  Bronchiectasis, recurrent sinus infections, right 
adrenalectomy, right oophorectomy, history left breast cancer diagnosed 3048. 
A search for DICOM formatted images was conducted for prior CT imaging studies 
completed at a non-affiliated media free facility.
TECHNIQUE: The chest was scanned from base of neck through the lung bases 
without contrast on a high resolution low dose CT scanner. Routine MPR and MIP 
3D renderings were reconstructed on an independent workstation with concurrent 
physician supervision.

[Series 2: chest w/o 2.0 i31s 3 · axial · non-contrast · 0.63mm/px · z∈[-307,-37]mm · 12 of 161 slices shown, 15 images]
[im 13/161  mediastinal]
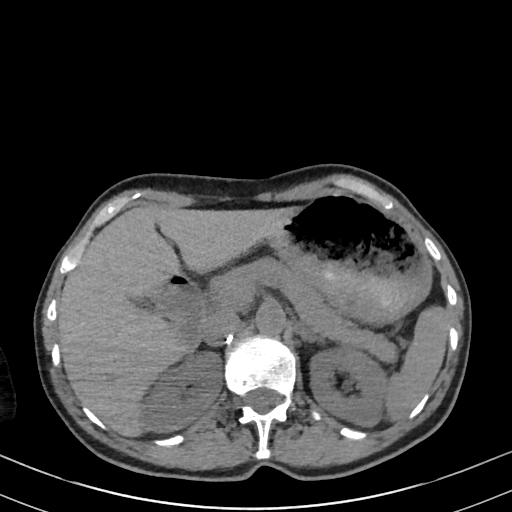
[im 13/161  lung]
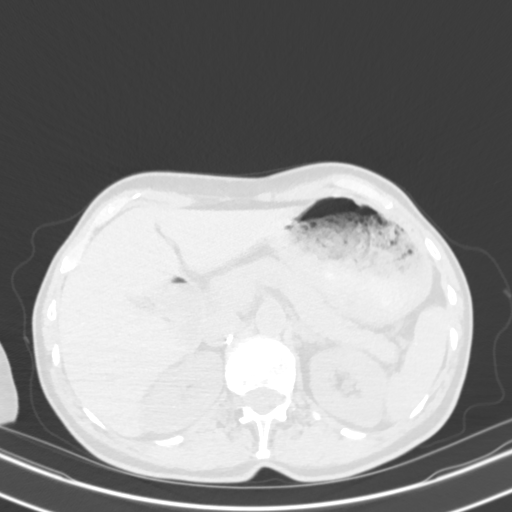
[im 25/161  lung]
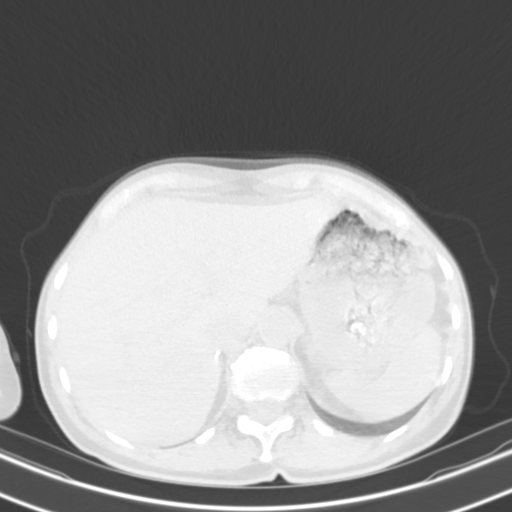
[im 37/161  lung]
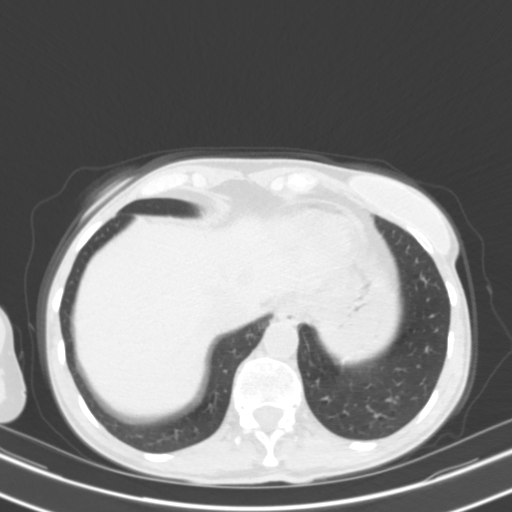
[im 50/161  lung]
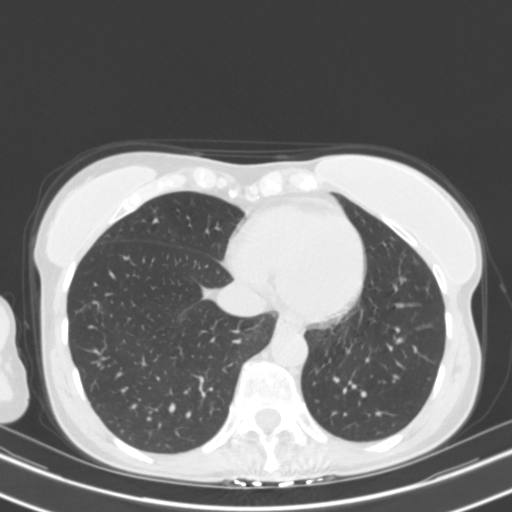
[im 62/161  mediastinal]
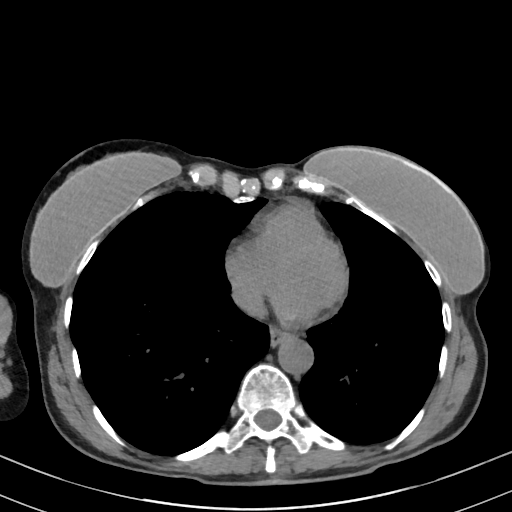
[im 62/161  lung]
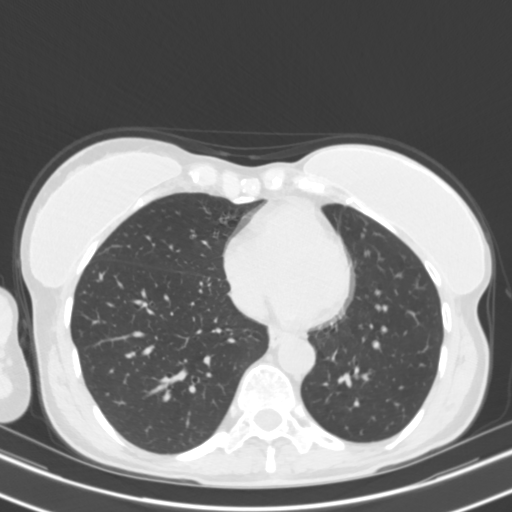
[im 74/161  lung]
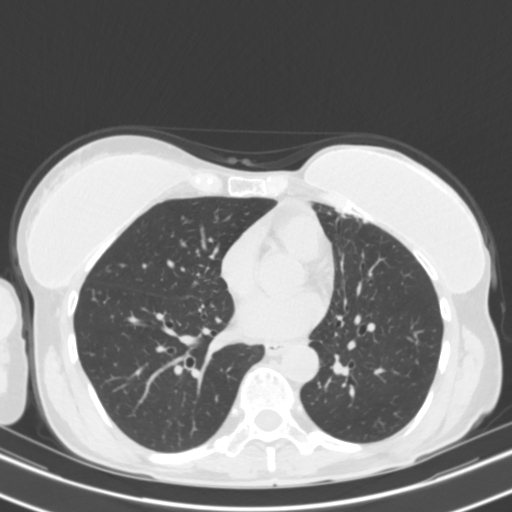
[im 87/161  lung]
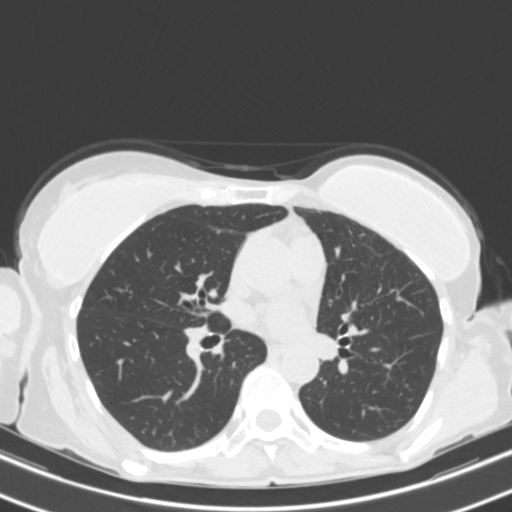
[im 99/161  lung]
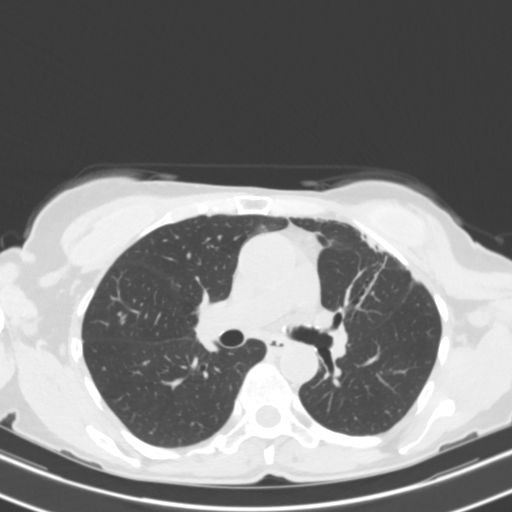
[im 111/161  mediastinal]
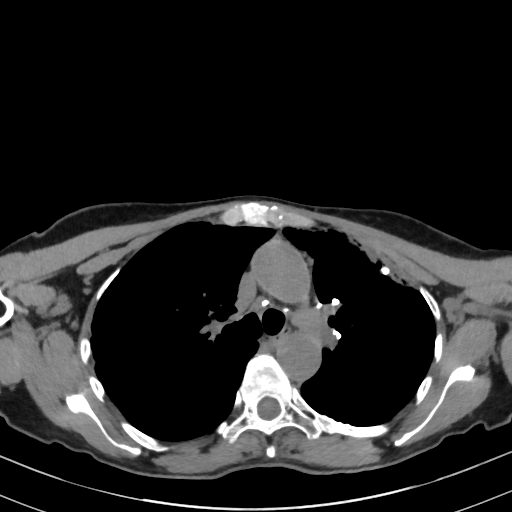
[im 111/161  lung]
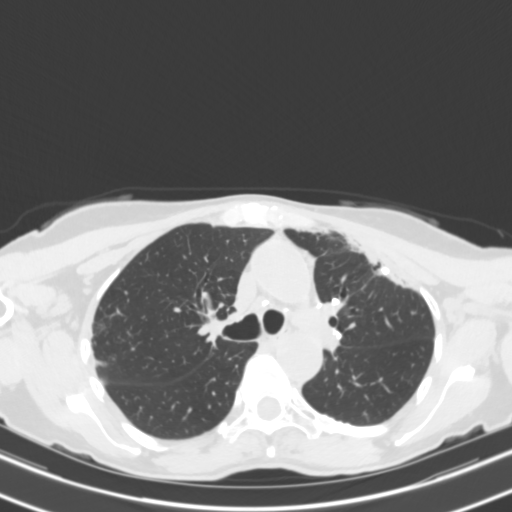
[im 124/161  lung]
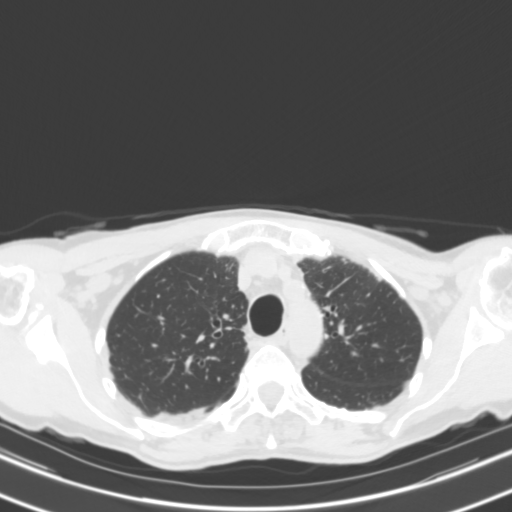
[im 136/161  lung]
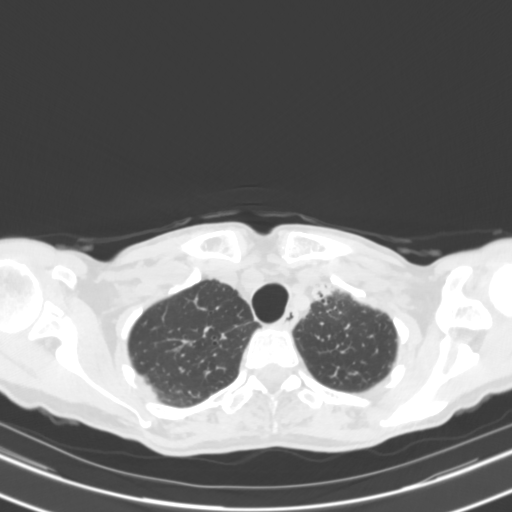
[im 148/161  lung]
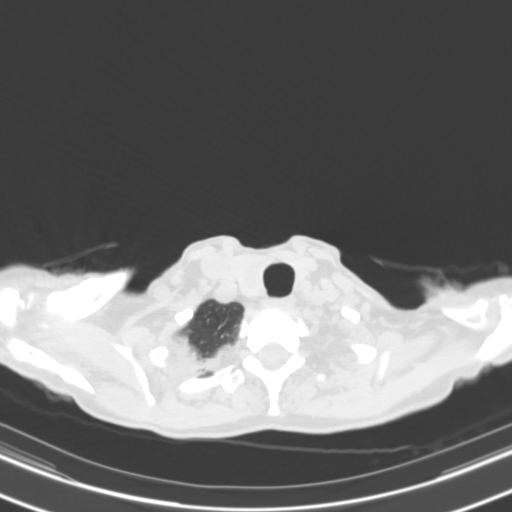

[Series 4: coronal · coronal · 0.63mm/px · 3 of 113 slices shown]
[im 23/113  lung]
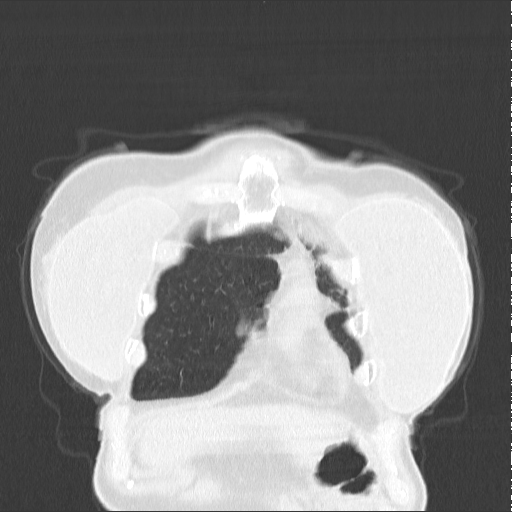
[im 45/113  lung]
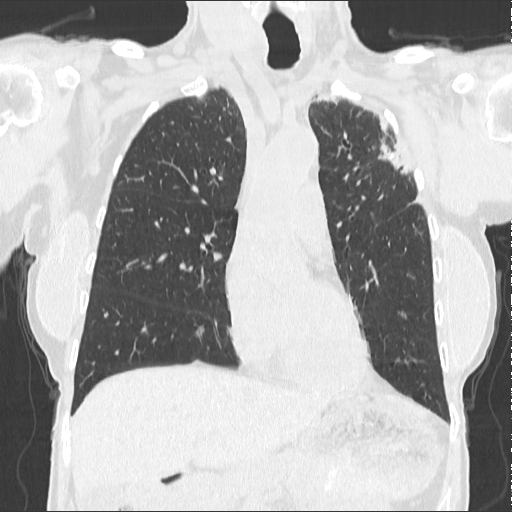
[im 68/113  lung]
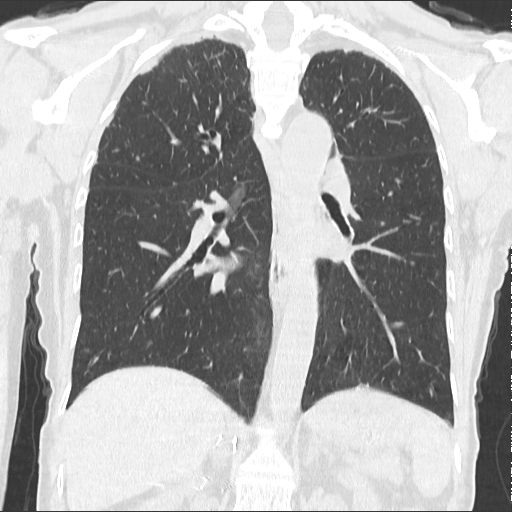

[15 of 36 positions shown; findings below may reference images not displayed]

FINDINGS: There is fibrosis along the anterior left chest wall suggesting 
postradiation treatment change. There is focal coarse calcification within the 
fibrotic lung. The area of fibrosis extends approximately 5.0 cm in length, 1 cm 
maximum width. 
There is bronchiectasis at the lung bases, lingula and right middle lobe. There 
are small tree-in-bud opacities in the right upper lobe best seen on axial image 
50-54. There is no airspace infiltrate. No pleural effusion. No dominant 
pulmonary nodule. The trachea and major bronchi are dilated. There are calcified 
mediastinal lymph nodes. There is no axillary adenopathy. The heart is not 
enlarged. There are bilateral breast implants. Left adrenal gland is normal. The 
right has been removed. There are mild thoracic spine degenerative changes.
IMPRESSION: Bronchiectasis. The trachea and major bronchi are dilated. No airspace 
infiltrate or effusion. 
Fibrosis along the anterior left chest wall likely reflects postradiation 
treatment change. 
Small tree-in-bud opacities in the inferior right upper lobe could reflect 
changes resulting from chronic bronchiolitis/mycobacterial involvement. No prior 
examination available. 
Status post right adrenal resection. Left adrenal gland appears normal. 
RADIATION DOSE REDUCTION: All CT scans are performed using radiation dose 
reduction techniques, when applicable.  Technical factors are evaluated and 
adjusted to ensure appropriate moderation of exposure.  Automated dose 
management technology is applied to adjust the radiation doses to minimize 
exposure while achieving diagnostic quality images.

## 2021-01-20 IMAGING — CT CT SINUS WITHOUT CONTRAST
3 series · 14 of 47 positions shown, 16 images · non-contrast
Comparison: None prior of the sinus.

CT SINUS WITHOUT CONTRAST, 01/20/2021 [DATE]: 
CLINICAL INDICATION:  Bronchiectasis. Chronic sinusitis. Recurrent sinus 
infections and migraines. 
A search for DICOM formatted images was conducted for prior CT imaging studies 
completed at a non-affiliated media free facility.
TECHNIQUE: The paranasal sinuses were scanned without contrast on a high 
resolution CT scanner using dose reduction techniques.  Routine MPR 
reconstructions were performed.

[Series 3: sinus 0.75 h30s · axial · 0.32mm/px · z∈[-122,-38]mm · 8 of 140 slices shown, 10 images]
[im 10/140  brain]
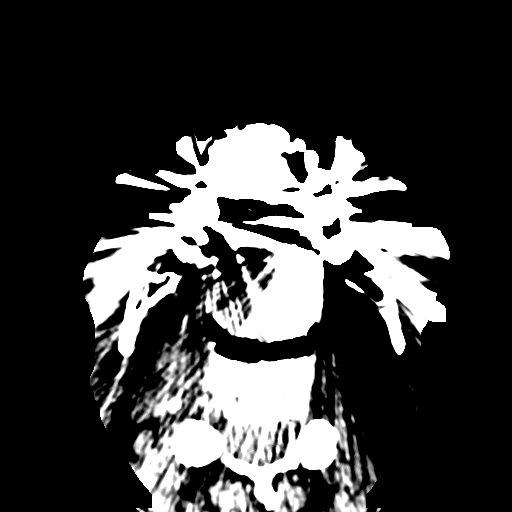
[im 10/140  bone]
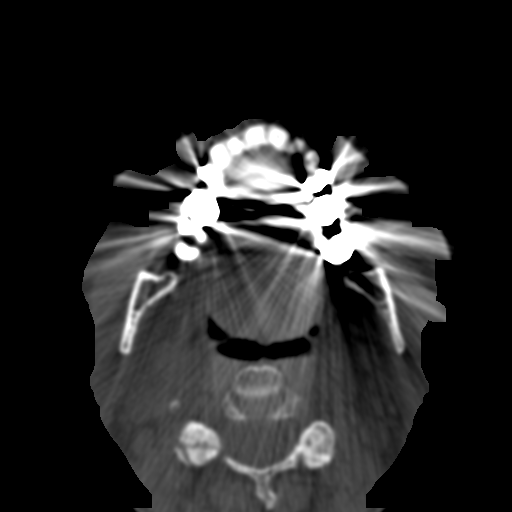
[im 29/140  bone]
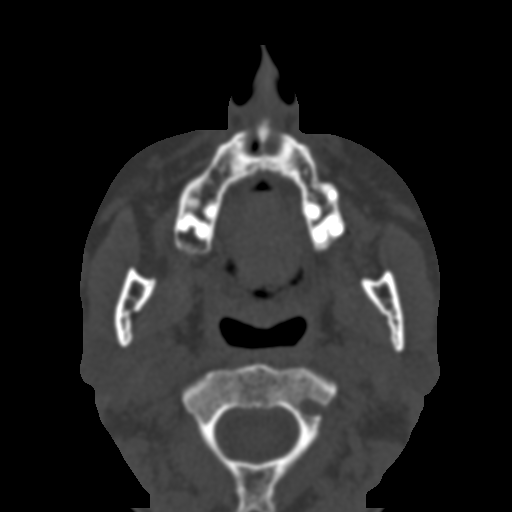
[im 44/140  bone]
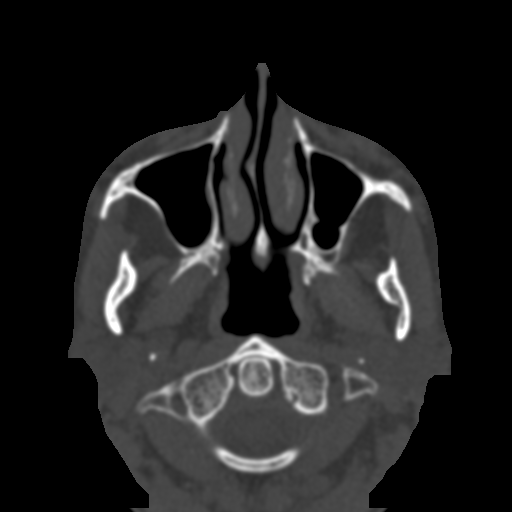
[im 63/140  bone]
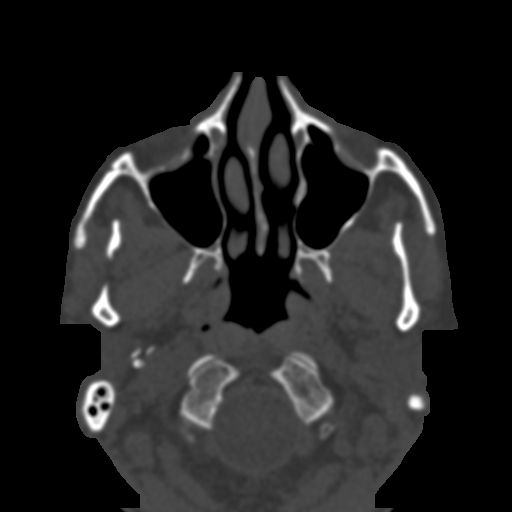
[im 77/140  brain]
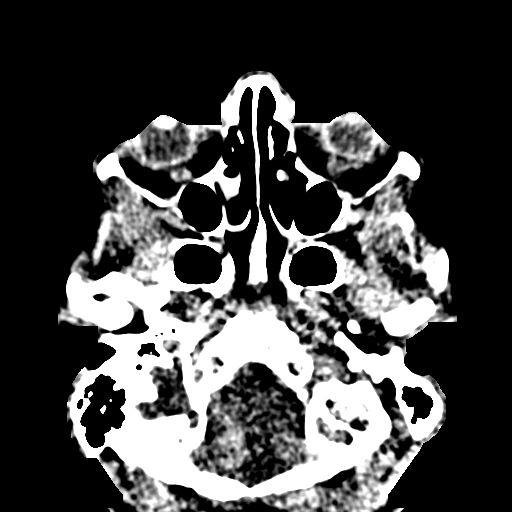
[im 77/140  bone]
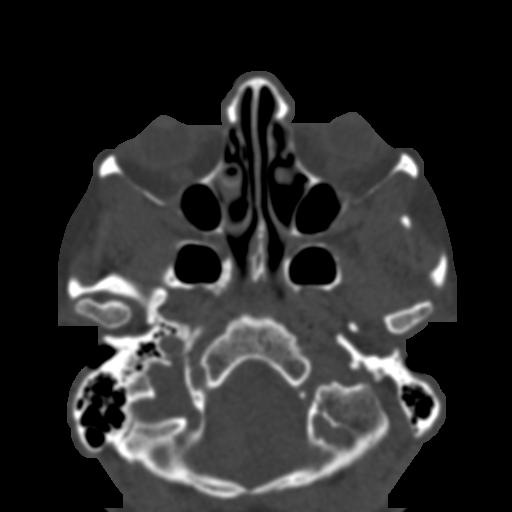
[im 96/140  bone]
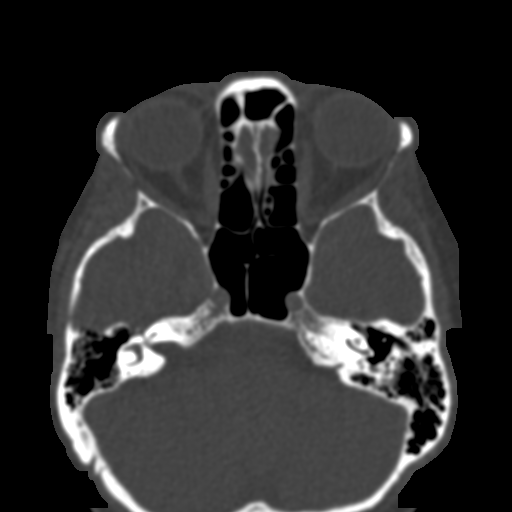
[im 111/140  bone]
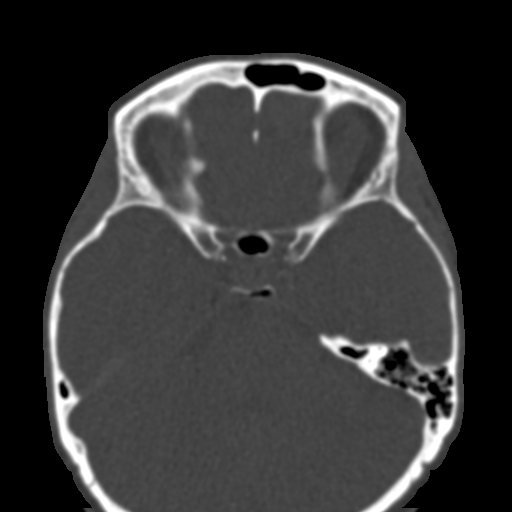
[im 130/140  bone]
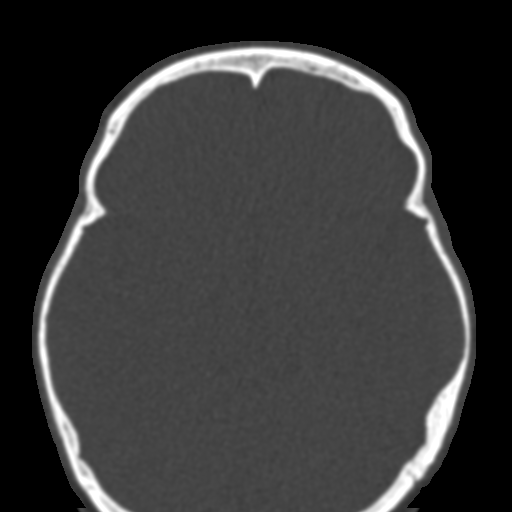

[Series 4: coronal · coronal · 0.19mm/px · 3 of 164 slices shown]
[im 55/164  bone]
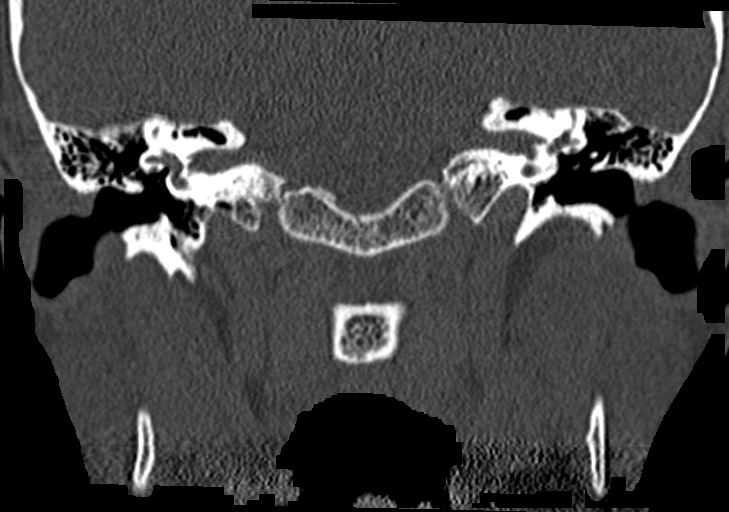
[im 73/164  bone]
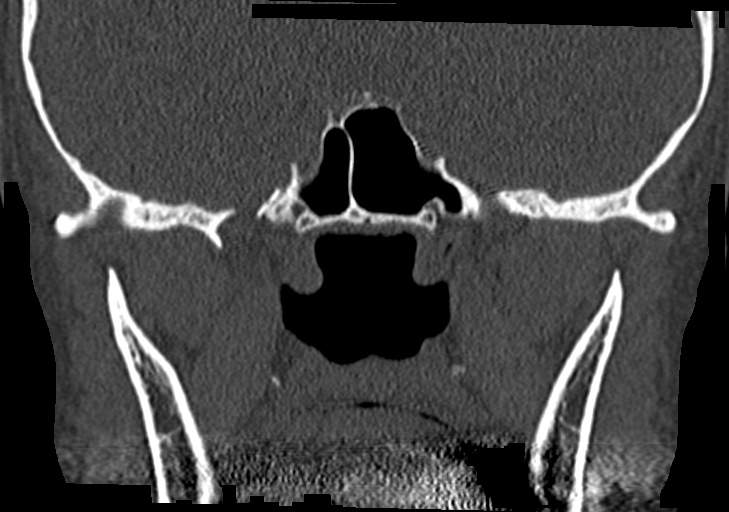
[im 91/164  bone]
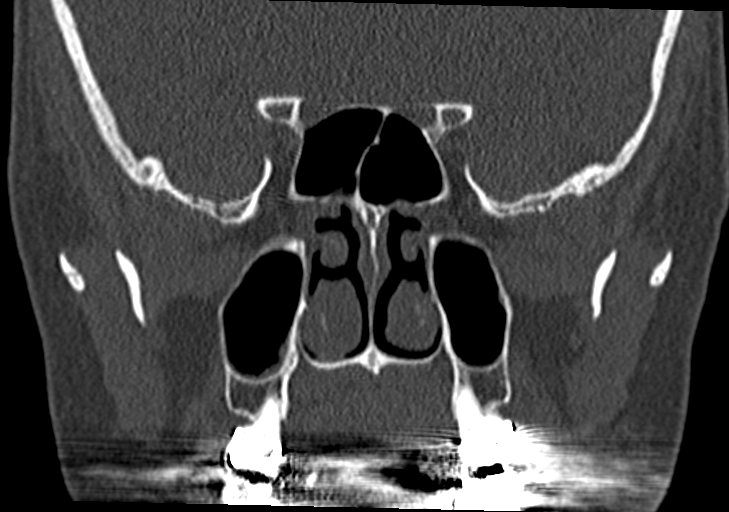

[Series 5: sagittal · sagittal · 0.20mm/px · 3 of 157 slices shown]
[im 53/157  bone]
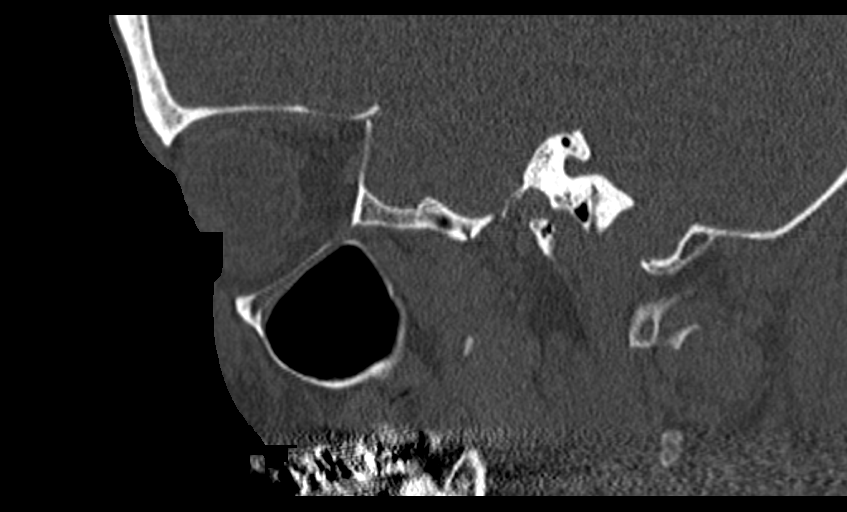
[im 79/157  bone]
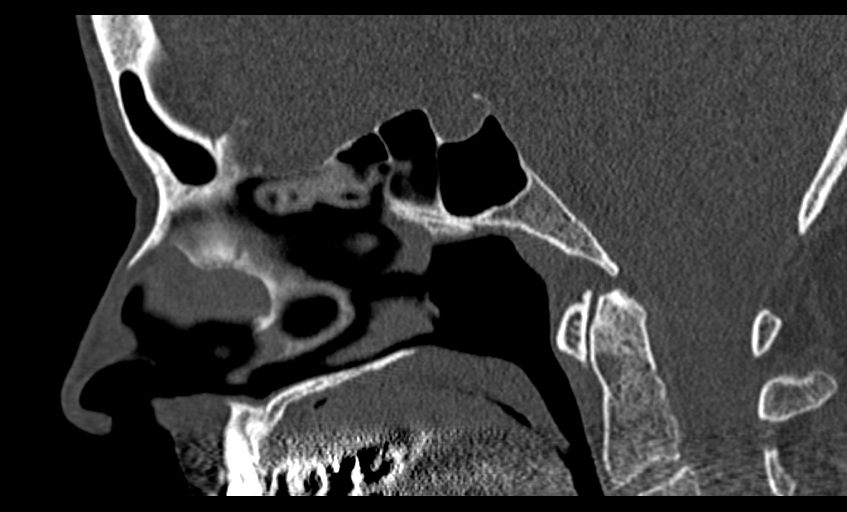
[im 105/157  bone]
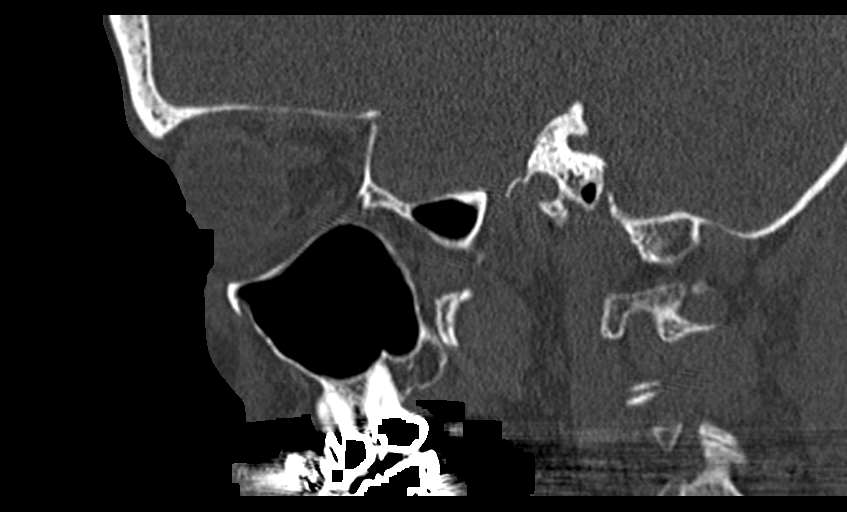

[14 of 47 positions shown; findings below may reference images not displayed]

FINDINGS: FRONTAL SINUSES: Clear. 
ETHMOID AIR CELLS: Clear. 
SPHENOID SINUS: Clear. 
MAXILLARY SINUSES: Trace mucoperiosteal membrane thickening right maxillary 
sinus. 
NASAL CAVITY: There is rightward curvature of the nasal septum with small spur. 
Bilateral concha bullosa, greater on the left. No polyps or significant 
hypertrophic turbinate. 
NASOPHARYNX: Adenoids within normal limits for age. No mass in the distribution 
of the visualized eustachian tube present. Parapharyngeal space clear. 
OSTIOMEATAL UNITS: Bilaterally patent. 
REMAINDER OF SKULL INCLUDING ORBITS AND SURROUNDING SOFT TISSUES: No mass or 
inflammation.
IMPRESSION: 1.  Mild mucoperiosteal membrane thickening right maxillary sinus. 
2.  Rightward nasal septal curvature with small spur. 
RADIATION DOSE REDUCTION: All CT scans are performed using radiation dose 
reduction techniques, when applicable.  Technical factors are evaluated and 
adjusted to ensure appropriate moderation of exposure.  Automated dose 
management technology is applied to adjust the radiation doses to minimize 
exposure while achieving diagnostic quality images.

## 2021-06-14 ENCOUNTER — Ambulatory Visit

## 2021-06-14 ENCOUNTER — Inpatient Hospital Stay: Admit: 2021-06-14 | Payer: MEDICARE | Primary: Family Medicine

## 2021-06-14 DIAGNOSIS — C50312 Malignant neoplasm of lower-inner quadrant of left female breast: Secondary | ICD-10-CM

## 2021-06-14 LAB — POC CHEM8
ANION GAP ISTAT,IAGAP: 11 mmol/L (ref 10–20)
Anion gap (POC): 11 mmol/L (ref 10–20)
CHLORIDE ISTAT,ICL: 94 mmol/L — ABNORMAL LOW (ref 98–107)
CO2 (POC): 25.6 mmol/L (ref 21–32)
Calcium, ionized (POC): 1.12 mmol/L (ref 1.12–1.32)
Calcium, ionized, POC: 1.12 mmol/L (ref 1.12–1.32)
Chloride (POC): 94 mmol/L — ABNORMAL LOW (ref 98–107)
Creatinine (POC): 0.52 mg/dL — ABNORMAL LOW (ref 0.6–1.3)
GFR African American: >60 mL/min/{1.73_m2} (ref >60)
GFR Non-African American: >60 mL/min/{1.73_m2} (ref >60)
GFRAA, POC: >60 mL/min/{1.73_m2} (ref >60)
GFRNA, POC: >60 mL/min/{1.73_m2} (ref >60)
GLUCOSE ISTAT,IGLU: 84 mg/dL (ref 65–100)
Glucose (POC): 84 mg/dL (ref 65–100)
POC Creatinine: 0.52 mg/dL — ABNORMAL LOW (ref 0.6–1.3)
POTASSIUM ISTAT,IK: 4.3 mmol/L (ref 3.5–5.1)
Potassium (POC): 4.3 mmol/L (ref 3.5–5.1)
SODIUM ISTAT,INA: 130 mmol/L — ABNORMAL LOW (ref 136–145)
Sodium (POC): 130 mmol/L — ABNORMAL LOW (ref 136–145)
TCO2 ISTAT,ITCO2: 25.6 mmol/L (ref 21–32)

## 2021-06-14 LAB — MAGNESIUM
Magnesium: 2.1 mg/dL (ref 1.6–2.4)
Magnesium: 2.1 mg/dL (ref 1.6–2.4)

## 2021-06-14 LAB — PHOSPHORUS
Phosphorus: 3.1 MG/DL (ref 2.6–4.7)
Phosphorus: 3.1 MG/DL (ref 2.6–4.7)

## 2021-06-14 MED ORDER — DENOSUMAB 60 MG/ML SUB-Q SYRINGE
60 mg/mL | Freq: Once | SUBCUTANEOUS | Status: AC
Start: 2021-06-14 — End: 2021-06-14
  Administered 2021-06-14: 18:00:00 via SUBCUTANEOUS

## 2021-06-14 MED FILL — PROLIA 60 MG/ML SUBCUTANEOUS SYRINGE: 60 mg/mL | SUBCUTANEOUS | Qty: 1

## 2021-06-14 NOTE — Progress Notes (Signed)
 Outpatient Infusion Center Short Visit Progress Note     Patient admitted to [facility] for prolia  ambulatory in stable condition. Assessment completed. No new concerns voiced. No dental work to be done or recently completed.    Covid Screening  1.Do you have any symptoms of COVID-19?  SOB, coughing, fever, or generally not feeling well ? no  2. Have you been exposed to COVID-19 recently? no  3. Have you had any recent contact with family/friend that has a pending COVID test? no    Vital Signs:  Visit Vitals  BP (!) 142/75   Pulse 74   Temp 97.8 F (36.6 C)   Resp 18   Wt 54.3 kg (119 lb 11.2 oz)   SpO2 99%   Breastfeeding No   BMI 19.32 kg/m         R arm  with positive blood return.   Lab Results:  Recent Results (from the past 12 hour(s))   POC CHEM8    Collection Time: 06/14/21  1:46 PM   Result Value Ref Range    Calcium, ionized (POC) 1.12 1.12 - 1.32 mmol/L    Sodium (POC) 130 (L) 136 - 145 mmol/L    Potassium (POC) 4.3 3.5 - 5.1 mmol/L    Chloride (POC) 94 (L) 98 - 107 mmol/L    CO2 (POC) 25.6 21 - 32 mmol/L    Anion gap (POC) 11 10 - 20 mmol/L    Glucose (POC) 84 65 - 100 mg/dL    Creatinine (POC) 9.47 (L) 0.6 - 1.3 mg/dL    GFRAA, POC >39 >39 fo/fpw/8.26f7    GFRNA, POC >60 >60 ml/min/1.68m2    Comment Comment Not Indicated.              Medications:  Medications Administered       denosumab  (PROLIA ) injection 60 mg       Admin Date  06/14/2021 Action  Given Dose  60 mg Route  SubCUTAneous Administered By  Leigh Jacobsen                    R arm SQ    Patient tolerated treatment well. Patient discharged from Outpatient Infusion Center ambulatory in no distress. Patient aware of next appointment with MD in December. Will call and make next prolia  injection appointment.    Jacobsen Leigh, RN    Future Appointments   Date Time Provider Department Center   09/07/2021  9:15 AM Gust Ferdie LABOR, NP [facility] AM[facility]   09/09/2021  1:30 PM Marguerite Leontine Jon CHRISTELLA [facility]

## 2021-06-15 ENCOUNTER — Encounter: Primary: Family Medicine

## 2021-06-17 ENCOUNTER — Encounter: Payer: MEDICARE | Primary: Family Medicine

## 2021-06-18 ENCOUNTER — Ambulatory Visit: Payer: MEDICARE | Primary: Family Medicine

## 2021-08-23 ENCOUNTER — Inpatient Hospital Stay: Admit: 2021-08-23 | Payer: MEDICARE | Primary: Family Medicine

## 2021-08-23 DIAGNOSIS — I972 Postmastectomy lymphedema syndrome: Secondary | ICD-10-CM

## 2021-08-23 NOTE — Progress Notes (Signed)
 [facility]  Lymphedema Clinic  [address]  [address] [place], [address]  [place], [place]  [address]     INITIAL EVALUATION     NAME: Emily [name] AGE: 70 y.o.  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  Patient has a history of recurrent cellulitis infections with last infection reported by patient to be in 2021.  Patient has been hospitalized twice for cellulitis.    She wears compression daily and has day and night compression products.  She also has a Nature conservation officer that she is using once a week.  Patient is concerned about recurrent infections and trying to prevent them in the future.  Patient also reports significant chest and shoulder tightness with a history of axillary web syndrome.  Her decreased range of motion results in her having to perform self care activities in a modified manner due to not being able to easily reach her back or the back of her head. Patient reports residing in [place]  adversely affects her lymphedema and medical status.  She returns to [place]  for care as able and she is planning to move back to [place]  in the future.   Patient is requesting to replace her day and night compression garments currently.   Lymphedema Life Impact Scale:  Scores 40 out of 68  with 59% impairment  Past Medical History:   Past Medical  History:   Diagnosis Date    Anxiety     Arthritis     Basal cell carcinoma     Cancer (HCC)     left breast  cancer    Chronic tension headaches     Concussion 2011    Headache(784.0)     OCCAS    Hx of radiation therapy     COMPLETED IN 2014    Ill-defined condition     cellulitis L UE    Lymphedema of arm     left    Nausea & vomiting     Other and unspecified symptoms and signs involving general sensations and perceptions     HIVES RECENTLY- WAS IN ER Feb 19, 2013    Other ill-defined conditions(799.89)     PHEOCHROMOCYTOMA    S/P chemotherapy, time since greater than 12 weeks     COMPLETED CHEMOTHERAPY IN JUNE 2014    SCCA (squamous cell carcinoma) of skin       Past Surgical History:   Procedure Laterality Date    HX APPENDECTOMY      HX BREAST AUGMENTATION Left 04/22/2014    REMOVAL OF LEFT BREAST IMPLANT, WASHOUT AND CLOSURE OF LEFT BREAST WOUND performed by Garnette CHRISTELLA Door, MD at [facility] MAIN OR    HX BREAST RECONSTRUCTION  11/28/2012    BREAST RECONSTRUCTION /C  INSERTION EXPANDER & ALLODERM performed by Garnette CHRISTELLA Door, MD at [facility]  AMBULATORY OR    HX BREAST RECONSTRUCTION Bilateral 09/16/2013    REMOVAL AND REPLACEMENT LEFT BREAST IMPLANT TISSUE EXPANDERS WITH SUBTOTAL CAPSULECTOMY, RIGHT BREAST IMPLANT REMOVAL AND REPLACEMENT FOR SYMMETRY/FAT GRAFTING TO BILATERAL BREASTS performed by Garnette CHRISTELLA Door, MD at [facility] MAIN OR    HX GYN      c-section    HX HEENT      T&A    HX HEENT      right adrenal removed    HX MASTECTOMY Left     HX MODIFIED RADICAL MASTECTOMY  11/28/2012    LEFT BREAST MODIFIED RADICAL MASTECTOMY WITH AXILLARY DISSECTION W/ RECONSTRUCTION LEFT BREAST WITH TISSUE EXPANDER AND ALLODERM;PORTACATH INSERTION performed by James Pellicane Jr V, MD at [facility] AMBULATORY OR    HX OOPHORECTOMY      right    HX ORTHOPAEDIC      bilateral shoulder arthroscopy    HX VASCULAR ACCESS      PORT, THEN REMOVED    PR ABDOMEN SURGERY PROC UNLISTED      hernia mesh repair    PR ABDOMEN SURGERY PROC UNLISTED      right  adenal removed    PR BREAST SURGERY PROCEDURE UNLISTED  '91    breast implants; replaced bilat implants 2011      Allergies   Allergen Reactions    Aspirin Unknown (comments)     Gi bleeding    Bacitracin Hives and Swelling    Compazine [Prochlorperazine] Rash    Nsaids (Non-Steroidal Anti-Inflammatory Drug) Swelling     Throat swells          Prior Level of Function/Social/Work History/Personal factors and/or comorbidities impacting plan of care: Patient is married and travels between [place]  and [place]  with most of her Medical care in [place]  and reports plans to move back to [place]  soon.     Has an adopted 68 year old son named Hassan.  Husband is a retired Estate agent.  Living Situation: Resides in townhouse with significant other but spends most of the year in [place]      Trainable Caregiver?: Not needed at this time  Mobility: Independent gait without any assistive device for community distances, modified independent to independent with self care and functional activities  Sleeping Arrangement:  in bed at night  Adaptive Equipment Owned: walker  Other: Patient is able to don compression garments     Previous Therapy:   Patient has been followed intermittently by this clinic since 2020 and previously seen at [facility]   Compression/Lymphedema Equipment: Juxo expert silver class 1 custom flat knit compression sleeve with silicone band, hand pieces including gloves and gauntlets, Solaris Tribute custom arm garment ordered in 2019, Flexitouch home vasopneumatic device, Jobst Relax night compression garment     SUBJECTIVE:  I really like that night garment you got for me before but I definitely need a new one now.  I have been going to a massage therapist in [place]   to work on the cording and lymphedema massage and it has really helped me.  I have been seeing her about every 3 weeks.     Patient's goals for therapy:  to keep it under control  OBJECTIVE DATA SUMMARY:    EXAMINATION/PRESENTATION/DECISION MAKING:      Pain:  Patient reports a current pain level of 3/10 numeric scale and she describes tightness and aching in the left lateral trunk, axilla and upper arm.  Patient pain level increases to 6/10 numeric scale by the end of  the day and as the day progresses.  She also reports chronic right shoulder pain due to a past injury.  Pain Scale 1: Numeric (0 - 10)  Pain Intensity 1: 3  Pain Location 1: Arm; Shoulder  Pain Orientation 1: Left  Pain Description 1: Aching, tightness  Patient Stated Pain Goal: 0     Self-Care and ADLs: Modified independent with increased time required. She is unable to reach behind her to close her bra, she can't wash her back and she is unable to reach overhead shelves     Skin and Tissue Assessment:  Dermal Status: Intact     Texture/Consistency: Boggy  forearm     Pigmentation/Color Change: Normal     Anomalies: N/A     Circulatory: Vascular studies ruled out DVT in past     Nails: Normal     Stemmers Sign: Negative  Height:  Height: 5' 6 (167.6 cm)  Weight:  Weight: 55.4 kg (122 lb 3.2 oz)   BMI:  BMI (calculated): 19.7  (36 or greater: adversely affecting lymphedema)                                                                           Volumetric Measurements:   Right: 1,606.40  mL (increased by 27.46ml since last assessment 09/09/2020 ) Left:  1,797.53 mL (increased by 113.77 ml since last assessment 09/09/2020)   % Difference:  11.90% left larger than right versus 6.65% left larger right on last assessment 09/09/2020 Dominance: right   (See scanned graph)     Range of Motion:  Limitations are noted in bilateral shoulders for shoulder flexion, abduction and internal rotation.  Not formally assessed today.  Strength: Not formally assessed 2* patient is able to complete all functional activities in the clinic today.  Sensation:  Intact   Palpation:  Axillary web cording remains present in the left axilla and medial posterior aspect of left  upper arm       Mobility:    Bed/Chair Mobility: Independent  Transfers: Independent  Gait: Independent  Endurance: good     Safety:  Patient is alert and oriented: yes  Safety awareness: good     Evaluation Time: 1145-1215  30 minutes     TREATMENT PROVIDED:   1.  Treatment description:  Therapeutic Exercise and Procedure:  Patient does participate in a walking program regularly. Patient reports performing upper extremity stretches daily.  She wears her compression garments with her exercise activities  Treatment time:  N/A                   Minutes: 0     2.  Treatment description:  Manual Therapy:  Patient is applying low pH diabetic Cerave lotion daily to left her arm and left upper quadrant and lotion is applied following manual lymph drainage principles.  Patient is knowledgeable of signs and symptoms of cellulitis and she reports she  last had an infection in 2021.  Reviewed results of reassessment today with decreased limb volume noted bilaterally.  Patient spends a significant amount of time in [place]  and she reports the weather there exacerbates her swelling. She reports she is returning to [place]  and is actively looking for a  house.  Reviewed her home management routine and she wears her day and night compression products daily.  Her compression sleeve is due for replacement  and she would also like to order a new glove. She requests to order a new sleeve in Juzo expert silver.  Therapist proceeded to measure patient for a custom Juzo silver expert sleeve and glove in compression class 1 and order will be placed with patient's preferred vendor of [employer] compression.  Discussed with patient the 30 day remake period upon garment delivery. Patient has a Flexitouch vasopneumatic device at home and she is using it about 1 days a week. Recommended she increase her frequency of use and patient reports she would like to try to use the product more.  Discussed night compression garments and patient really  liked the Jobst Relax nght garment.  Proceeded to measure patient for custom Jobst Relax sleeve to the MCP joints in preferred color of rose.   Requested patient return for a garment fitting and she is agreeable to do so.  Reinforced 30 day compression garment remake timeframe.  Patient continues to present with persistent axillary web syndrome, but time did not permit for therapist to perform soft tissue mobilizations today as patient had to leave for another appointment.  Patient to return in December for garment fitting and soft tissue mobilizations.  Treatment time:   1215-1305                    Minutes:50    Post treatment pain level: 3 out of 10 numeric scale  ASSESSMENT:   Evaluation completed.  See plan of care.  Measured for day and night compression garments today.          Patient has participated in goal setting and agrees to work toward plan of care.  Patient was instructed to call if questions or concerns arise.     Thank you for this referral.   Jon Pao, MSPT, CLT-LANA        Total timed codes:  50  1:1 Treatment time: 50

## 2021-08-23 NOTE — Discharge Instructions (Signed)
Therapy Discharge by Cristal Generous, Dionne Bucy at 08/23/21 1145                Author: Rosann Auerbach  Service: Physical Therapy  Author Type: Physical Therapist       Filed: 12/27/21 0851  Date of Service: 08/23/21 1145  Status: Signed          Editor: Cristal Generous, Dionne Bucy (Physical Therapist)               Outpatient Lymphedema,    a part of [facility]   [address], [place] [address]   Phone: [phone] Fax: [phone]         Discharge Summary 2-15      Patient name: Emily [name]  DOB : 1951/07/19  Provider#: [id number]   Referral source: Joyce Gross, MD       Medical/Treatment Diagnosis: 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  Her plan of care has expired and she is discharged from therapy  at this time.  No goals were met for discharge as patient did not return to the clinic       Goals:    Time frame:     1.  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   2.  Patient will demonstrate independence in lymphedema home program of therapeutic exercises to improve circulation and decongest limb to improve  ADLs.   3.  Pt will receive properly fitted left upper extremity custom compression sleeve in order to provide optimal swelling management over time.           Long Term Goals: To be accomplished in 6 treatments:       1.  Patient/Caregiver will be independent with don/doff of compression system and use in  order to prevent reaccumulation of fluid at discharge.   2. Patient/Caregiver will be independent in home lymphedema management.   3. Patient/Caregiver will demonstrate stable or reduced limb volumes to indicate appropriateness for restorative phase of care.                 RECOMMENDATIONS:   _0 Discontinue therapy: _1 Patient has reached or is progressing toward set goals       _2 Patient is non-compliant or has  abdicated       _3  Due to lack of appreciable progress towards set goals       _4 Other: patient did not return to  therapy       Rolan Bucco, MSPT, CLT-LANA 12/27/2021

## 2021-08-23 NOTE — Progress Notes (Signed)
Outpatient Lymphedema Clinic  a part of 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  Prior Level of Function:  Independent gait without any assistive device for community distances, Modified independent with increased time required. She is unable to reach behind her for dressing activities.  Medications: Verified on Patient Summary List  Start of Care: 08/23/2021      Onset Date:  11/28/2018  Patient returns for re-evaluation   The Plan of Care and following information is based on the information from the initial evaluation.    Assessment/ key information:   Emily [name] is a 70 y.o. female who presents with  left upper extremity stage 2 lymphedema secondary to a history of left breast cancer with mastectomy and reconstruction.  Patient has a history of recurrent cellulitis infections with the last infection having been in 2021.  Patient also presents with axillary web cording on the left side and pain is reported in the left upper quadrant.  Swelling is increased today.  Patient travels between [place] and [place] which has not allowed for consistent treatment in the clinic of the axillary web syndrome.  She would benefit from soft tissue mobilizations to improve her symptoms. Her current compression garments are due to be replaced.   Patient is motivated to improve her condition.  Patient continues to benefit from complete decongestive therapy (CDT) including: Manual lymphatic drainage (MLD) technique,  Short-stretch textile bandages/compression system to decongest limb and Kinesiotaping as appropriate.         This care is medically necessary due to the infection risk with lymphedema and to improve functional activities.  CDT is necessary to resolve swelling to allow patient to return to wearing normal clothes and prevent worsening of symptoms, such as infections and/or hospitalizations. Patient will be independent with home program strategies to allow improved ADL ability and mobility and to allow patient to return to greatest functional independence    Evaluation Complexity History MEDIUM  Complexity : 1-2 comorbidities / personal factors will impact the outcome/ POC ; Examination MEDIUM Complexity : 3 Standardized tests and measures addressing body structure, function, activity limitation and / or participation in recreation  ;Presentation MEDIUM Complexity : Evolving with changing characteristics  ;Clinical Decision Making Other outcome measures LLIS scores 59% impairment  MEDIUM  Overall Complexity Rating: MEDIUM    Problem List: pain affecting function, decrease ROM, and edema affecting function   Treatment Plan may include any combination of the following: Therapeutic exercise, Manual therapy, Self care/home management, Vasopneumatic device, and Other: measure and fit for compression garments  Patient / Family readiness to learn indicated by: asking questions, trying to perform skills, and interest  Persons(s) to be included in education: patient (P)  Barriers to Learning/Limitations: yes;  other patient therapy attendance is intermittent as her primary residence is in [place]  Patient Goal (s): "keep it under control"   Rehabilitation Potential: fair    Short Term Goals: To be accomplished in 3 treatments:     Time frame:  1.  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  2.  Patient will demonstrate independence in lymphedema home program of  therapeutic exercises to improve circulation and decongest limb to improve ADLs.  3.  Pt will receive properly fitted left upper extremity custom compression sleeve in order to provide optimal swelling management over time.      Long Term Goals: To be accomplished in 6 treatments:     1.  Patient/Caregiver will be independent with don/doff of compression system and use in order to prevent reaccumulation of fluid at discharge.  2. Patient/Caregiver will be independent in home lymphedema management.  3. Patient/Caregiver will demonstrate stable or reduced limb volumes to indicate appropriateness for restorative phase of care.       Frequency / Duration: Patient to be seen 1 times every 2-4 weeks  for 6 treatments.    Patient/ Caregiver education and instruction: self care and other compression garment options and ordering    [x]   Plan of care has been reviewed with PTA        Certification Period: 08/23/2021 to 11/18/2021     Jodene Nam, MSPT, CLT-LANA 08/23/2021     ________________________________________________________________________    I certify that the above Therapy Services are being furnished while the patient is under my care. I agree with the treatment plan and certify that this therapy is necessary.    Physician's Signature:____________________  Date:____________Time: _________         Leotis Shames, MD

## 2021-09-07 ENCOUNTER — Ambulatory Visit: Admit: 2021-09-07 | Discharge: 2021-09-07 | Payer: MEDICARE | Attending: Nurse Practitioner | Primary: Family Medicine

## 2021-09-07 DIAGNOSIS — C50312 Malignant neoplasm of lower-inner quadrant of left female breast: Secondary | ICD-10-CM

## 2021-09-07 DIAGNOSIS — Z853 Personal history of malignant neoplasm of breast: Secondary | ICD-10-CM

## 2021-09-07 MED ORDER — MASTECTOMY PROSTHESIS
0 refills | Status: AC
Start: 2021-09-07 — End: ?

## 2021-09-07 NOTE — Progress Notes (Signed)
 Identified pt with two pt identifiers. Reviewed record in preparation for visit and have obtained necessary documentation. All patient medications has been reviewed.    Chief Complaint   Patient presents with    Follow-up     Additional information about chief complaint:    Visit Vitals  BP 113/70 (BP 1 Location: Right upper arm, BP Patient Position: Sitting)   Pulse 84   Temp 98.6 F (37 C) (Temporal)   Ht 5' 6 (1.676 m)   Wt 121 lb (54.9 kg)   SpO2 98%   BMI 19.53 kg/m       Health Maintenance Due   Topic    Hepatitis C Screening     Depression Screen     DTaP/Tdap/Td series (1 - Tdap)    Lipid Screen     Shingrix Vaccine Age 81> (1 of 2)    Breast Cancer Screen Mammogram     Medicare Yearly Exam     Bone Densitometry (Dexa) Screening     Pneumococcal 65+ years (2 - PPSV23 if available, else PCV20)    Colorectal Cancer Screening Combo     COVID-19 Vaccine (4 - Booster for Pfizer series)    Flu Vaccine (1)       1.Have you been to the ER, urgent care clinic since your last visit?  Hospitalized since your last visit? no    2. Have you seen or consulted any other health care providers outside of the [facility] since your last visit?  Include any pap smears or colon screening. no

## 2021-09-07 NOTE — Progress Notes (Signed)
[facility]  [address] [place]   [address]  W: [phone]   F: [phone]        Reason for Visit:   Emily [name] is a 70 y.o. female who is seen  for follow up of breast cancer      Consulting physician:  Dr. Gilford Rile, Dr. Raynald Blend, Brooke Bonito.  Ref physician:  Dr. Drucilla Schmidt    HPI:   Emily [name] is a 70 y.o.  female who I am seeing for management of breast cancer        She had a screening ultrasound on 09/24/12 showing a 2.6 cm left LN.  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  HER 2 negative IHC at 0.    She has a history of a pheochromocytoma surgically removed in 1997.    She then had a MRI breast on 10/05/12 showing the 2.8 x 2 cm LN as well as a left breast LIQ 0.9 cm x 1.1 cm x 0.6 cm mass.  Biopsy of that mass by FNA on 10/16/12 shows a poorly differentiated carcinoma.  These slides were air-dried, HER 2 not able to be performed on them.    Mammaprint of LN shows high risk luminal, ER + at 0.19, PR + at 0.61, HER 2 negative at -0.69.    She underwent L mastectomy on 11/28/12 showing IDC with neuroendocrine features, gr 2, 0.5 cm, DCIS gr 2-3, + LVI, 1/14 LN + with a 1 cm met, no ECE.  HER 2 negative (ratio 1.2, sig/cell 2.8), ER + 95%, PR + 5%; ki67 5%; DCIS ER + 95%, PR + 1%.    S/p TC q 3 weeks x 4 from 12/25/12-02/26/13 (75 mg/m2; 600 mg/m2)    S/p XRT from 04/02/13-05/10/13    Started anastrozole 05/17/13, stop 07/30/13 (joint pains)    exemestane 08/06/13-09/30/13 (joint pains)    Started zometa 07/10/13, q 6 months x 3 years, stopped     Started tamoxifen 09/30/13, stopped on 03/08/14 due to joint pains    Started letrozole 03/23/14, stopped 09/18/14 due to joint pains    Started tamoxifen  10/04/14, stopped on 12/15/14 due to hot flashes and mood swings.    Anastrozole 12/16/14-01/30/15, stopped due to headache and dizziness    Toremefine 02/09/15-05/2018    Interval History:   She presents today for follow up. Reports gr 1 hot flashes, gr 2 insomnia, gr 1 anxiety/depression, gr 1 mouth sores, gr 2 headache, gr 2 loss of libido, gr 2 vaginal dryness.     She has moved to [place] but still has a residence here.    LMP 1996.    DX   No diagnosis found.     Past Medical History:   Diagnosis Date    Anxiety     Arthritis     Basal cell carcinoma     Cancer ([place])     left breast  cancer    Chronic tension headaches     Concussion 2011    Headache(784.0)     OCCAS    Hx of radiation therapy     COMPLETED IN 2014    Ill-defined condition     cellulitis L UE    Lymphedema of  arm     left    Nausea & vomiting     Other and unspecified symptoms and signs involving general sensations and perceptions     HIVES RECENTLY- WAS IN ER Feb 19, 2013    Other ill-defined conditions(799.89)     PHEOCHROMOCYTOMA    S/P chemotherapy, time since greater than 12 weeks     COMPLETED CHEMOTHERAPY IN JUNE 2014    SCCA (squamous cell carcinoma) of skin      Past Surgical History:   Procedure Laterality Date    HX APPENDECTOMY      HX BREAST AUGMENTATION Left 04/22/2014    REMOVAL OF LEFT BREAST IMPLANT, WASHOUT AND CLOSURE OF LEFT BREAST WOUND performed by Sharlotte Alamo, MD at [facility] MAIN OR    HX BREAST RECONSTRUCTION  11/28/2012    BREAST RECONSTRUCTION /C  INSERTION EXPANDER & ALLODERM performed by Sharlotte Alamo, MD at [facility]    HX BREAST RECONSTRUCTION Bilateral 09/16/2013    REMOVAL AND REPLACEMENT LEFT BREAST IMPLANT TISSUE EXPANDERS WITH SUBTOTAL CAPSULECTOMY, RIGHT BREAST IMPLANT REMOVAL AND REPLACEMENT FOR SYMMETRY/FAT GRAFTING TO BILATERAL BREASTS performed by Sharlotte Alamo, MD at [facility] MAIN OR    HX GYN      c-section    HX HEENT      T&A    HX HEENT      right adrenal removed    HX MASTECTOMY Left     HX MODIFIED  RADICAL MASTECTOMY  11/28/2012    LEFT BREAST MODIFIED RADICAL MASTECTOMY WITH AXILLARY DISSECTION W/ RECONSTRUCTION LEFT BREAST WITH TISSUE EXPANDER AND ALLODERM;PORTACATH INSERTION performed by Ty Hilts, MD at [place] [facility]    HX OOPHORECTOMY      right    HX ORTHOPAEDIC      bilateral shoulder arthroscopy    HX VASCULAR ACCESS      PORT, THEN REMOVED    PR ABDOMEN SURGERY PROC UNLISTED      hernia mesh repair    PR ABDOMEN SURGERY PROC UNLISTED      right adenal removed    PR BREAST SURGERY PROCEDURE UNLISTED  '91    breast implants; replaced bilat implants 2011     Social History     Socioeconomic History    Marital status: SINGLE   Tobacco Use    Smoking status: Never    Smokeless tobacco: Never   Substance and Sexual Activity    Alcohol use: No    Drug use: No     Family History   Problem Relation Age of Onset    Alzheimer's Disease Mother     Diabetes Mother     Other Mother         COMBATIVE POST ANESTHESIA    Heart Disease Father     Diabetes Father     Lung Disease Father         COPD    Headache Sister     Migraines Sister     Anesth Problems Neg Hx        Current Outpatient Medications   Medication Sig Dispense Refill    MASTECTOMY PROSTHESIS misc 1 Each by Does Not Apply route daily. 4 Each 1    oxyCODONE IR (ROXICODONE) 5 mg immediate release tablet Take 5 mg by mouth every six (6) hours as needed for Pain.      acetaminophen (TYLENOL) 500 mg tablet Take 500 mg by mouth every six (6) hours as needed for Pain.  butalbital-acetaminophen-caff (FIORICET) 50-300-40 mg per capsule Take 1 Cap by mouth every four (4) hours as needed for Headache.      DULoxetine (CYMBALTA) 30 mg capsule Take 30 mg by mouth two (2) times a day.  3    zolpidem (AMBIEN) 10 mg tablet TAKE 1 TABLET BY MOUTH AT BEDTIME  3       Allergies   Allergen Reactions    Aspirin Unknown (comments)     Gi bleeding    Bacitracin Hives and Swelling    Compazine [Prochlorperazine] Rash    Nsaids (Non-Steroidal Anti-Inflammatory  Drug) Swelling     Throat swells       Review of Systems    A comprehensive review of systems was performed and all systems were negative except for HPI       Objective:  Physical Exam:  Visit Vitals  BP 113/70 (BP 1 Location: Right upper arm, BP Patient Position: Sitting)   Pulse 84   Temp 98.6 ??F (37 ??C) (Temporal)   Ht _0  (1.676 m)   Wt 121 lb (54.9 kg)   SpO2 98%   BMI 19.53 kg/m??     ECOG PS: 0  General: No distress  Eyes: PERRL, anicteric sclerae  HENT: Atraumatic, OP clear  Neck: Supple  Respiratory: normal respiratory effort  CV: no peripheral edema  MS: Normal gait and station. Digits without clubbing or cyanosis.  Skin: No rashes, ecchymoses, or petechiae. Normal temperature, turgor, and texture.  Psych: Alert, oriented, appropriate affect, normal judgment/insight    Diagnostic Imaging   10/12/12 PET:  Negative for distant mets  10/22/12 brain MRI:  Negative    09/24/12 dexa L hip T -2.5; L spine T -2.3  osteoporosis    12/11/13 bilateral MRI  Negative    03/26/14 MRI Brain  Negative    12/05/14 MRI breast   Negative    12/05/14 [place] L breast      IMPRESSION:  1. Very small, hypoechoic lesion in continuity and immediately below the    dermis overlying the sternum/medialmost left breast. Finding suggests dermal    lesion such as epidermal inclusion cyst versus postoperative change.  BI-RADS Category 2 - Benign findings.      01/01/16 breast MRI  Negative    08/22/16 Breast MRI  Negative    04/17/17 [place] right ext  IMPRESSION: Probable lipoma in the right axillary region. Given that this lesion is  palpable, clinical follow-up is recommended.     08/31/17 MRI breast  IMPRESSION:   1. No MRI evidence of malignancy.  2. Bilateral silicone implants are intact.    11/23/18 ct chest wo contrast  IMPRESSION:  Postsurgical and postradiation changes of the thorax  Nodules appear to correspond to nodular pleural thickening  Nonspecific soft tissue induration in the left axilla. Please correlate with  clinical factors. If there  is concern regarding vasculitis or thrombosis, then  Doppler ultrasound would be recommended.  Right thyroid nodule would be better evaluated by thyroid ultrasound.  Incidental granulomatous disease.    09/07/19 MRI breast  IMPRESSION:     Right Breast:  1. BI-RADS Assessment Category 2: Benign finding. Submuscular silicone implant  which is intact. No complicating features.  2. No evidence of malignancy within the right breast.  3. No significant change.     Left Breast:  1. BI-RADS Assessment Category 2: Benign finding. Stable and benign postsurgical  changes of mastectomy with submuscular silicone implant reconstruction. The  implant is intact.  2. No evidence of recurrent malignancy within the reconstructed left breast.  Specifically, no suspicious findings in the left axillary tail.  3. No significant change.       08/31/18 MRI brain  IMPRESSION:      1. No acute intracranial abnormality. No evidence of intracranial metastases.  2. Unchanged mild chronic microvascular ischemic disease.  3. Stable incidental pineal cyst.     Assessment: ACR BI-RADS category   Left breast: BI-RADS Assessment Category 1: Negative.  Right breast: BI-RADS Assessment Category 1: Negative.    07/12/19 CT chest wo contrast  IMPRESSION:  Previously seen nodular airspace disease in the right upper lobe is improved;  infectious bronchiolitis, such as MAI, is favored. No new pulmonary nodule or  airspace disease. Unchanged subpleural fibrosis in the anterior left upper lobe,  compatible with posttreatment changes. Granulomatous disease.    Lab Results  Lab Results   Component Value Date/Time    WBC 10.4 11/24/2018 05:15 AM    HGB 11.0 (L) 11/24/2018 05:15 AM    HCT 34.0 (L) 11/24/2018 05:15 AM    PLATELET 172 11/24/2018 05:15 AM    MCV 91.9 11/24/2018 05:15 AM       Lab Results   Component Value Date/Time    Sodium 138 11/26/2018 04:17 AM    Potassium 3.9 11/26/2018 04:17 AM    Chloride 107 11/26/2018 04:17 AM    CO2 25 11/26/2018 04:17 AM     Anion gap 6 11/26/2018 04:17 AM    Glucose 75 11/26/2018 04:17 AM    BUN 9 11/26/2018 04:17 AM    Creatinine 0.50 (L) 11/26/2018 04:17 AM    BUN/Creatinine ratio 18 11/26/2018 04:17 AM    GFR est AA >60 11/26/2018 04:17 AM    GFR est non-AA >60 11/26/2018 04:17 AM    Calcium 8.4 (L) 11/26/2018 04:17 AM    Alk. phosphatase 88 11/25/2018 04:50 AM    Protein, total 5.8 (L) 11/25/2018 04:50 AM    Albumin 3.0 (L) 11/25/2018 04:50 AM    Globulin 2.8 11/25/2018 04:50 AM    A-G Ratio 1.1 11/25/2018 04:50 AM    ALT (SGPT) 122 (H) 11/25/2018 04:50 AM     Assessment/Plan:  70 y.o. female with a 0.5 cm left breast cancer, IDC with endocrine features, 1/14 LN +, gr 2, high risk mammaprint,  ER and PR +, HER 2 negative. PS 0    1. Breast cancer with neuroendocrine features, Left, lower inner quadrant, stage IIA, T1bN1aMx    With no further therapy, her RR is about 36%; endocrine therapy will decrease her RR to 19%; and 2nd gen chemo will decrease her RR to 12%; 3rd gen to 10%.  These #s may be higher with her high risk mammaprint.     Based on her pheo and now breast cancer history, I recommended genetic counseling -- referral to [facility] made at prior visit. Has seen them and they have performed BRCA testing -- negative.  [facility] has run a PGL panel on 09/10/14.    No evidence of recurrence, completed toremifene in 05/2018.       Breast MRI 10/08/20 negative. MRI for 10/2021 ordered.     Discussed again that tumor markers are NOT recommended per ASCO guidelines    Had long discussion today that now 3 years after completion of endocrine therapy she no longer needs to follow with [facility]. Discussed follow up with PCP and GYN for prolia and breast imaging. Patient reports no one is  willing to perform prolia in their office and she is unable to obtain in anywhere else. She wishes to see [place] back in one year.       2. Anxiety/Depression: Stable today; on cymbalta     3. Osteoporosis: Had received zometa previously, which was given for  one dose on 07/10/13. Switched to prolia 60 mg SC q 6 months, received 04/08/14,10/01/14, 03/10/15, 09/14/15. She had one remaining Prolia injection, but she had oral surgery in July 2017, discontinued    DEXA 10/01/14 at the [facility], patient reports improvement. Repeat DEXA in 2017 showed stable osteoporosis.  dexa in 2018 showed improvement.  Repeat DEXA on 02/18/2018 showed worsening osteoporosis to left femoral neck and lumbar spine. This may have been due to stopping the prolia, as now I would not recommend stopping it unless another oral issue arises.    Currently taking Vit D3 2000 International Units daily.    Plan was to restart prolia 60 mg SC q 6 months in 03/2018, however she was unable to do this due to insurance.  She restarted 10/15/2018.  Last dose was 06/14/2021. Next due 12/11/20.    DEXA ordered, she states she had done with her PCP and will send [place] records.     4. Fibromyalgia/RA: Not currently on any treatment, previously discussed with patient that there are medical options to assist with her pain. She continues to follow up with her Rheumatologist every 6 weeks. She took MTX prior to cancer diagnosis which she did not tolerate.  Previously did not tolerate lyrica due to sedation.  She reports she has been receiving cortisone injections. She is taking Cymbalta which has helped.     5. Right thyroid nodule:  bx negative 05/16/19    6. Right ax nodule: saw Dr. Drucilla Schmidt, lipoma    7. Pulmonary nodules:  Radiation changes vs. Infectious bronchiolitis; following with pulmonary    Sonda Rumble, MD

## 2021-09-09 ENCOUNTER — Encounter: Payer: MEDICARE | Primary: Family Medicine

## 2021-09-21 IMAGING — MR MRI BRAIN W/WO CONTRAST
5 of 13 series · 20 of 48 positions shown · IV contrast (gadavist)
Comparison: No prior cranial examination

________________________________________________________________________________________________ 
MRI BRAIN W/WO CONTRAST,09/21/2021 [DATE]: 
CLINICAL INDICATION: Chronic migraine headache
TECHNIQUE: Axial T1, Axial T2, Axial FLAIR, Diffusion weighted images, Sagittal 
T1, Enhanced Axial T1, and Enhanced coronal fat-suppressed T1 were obtained.
ccs of Gadavist was injected intravenously by hand. The patients eGFR was 
calculated to be 158.7 mL/min/1.73 m2 using the i-STAT device.

[Series 501: FLAIR · axial · 5.0mm · 0.60mm/px · z∈[-104,+44]mm · 3 of 27 slices shown]
[im 1/27]
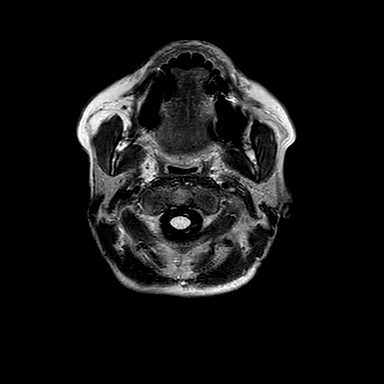
[im 14/27]
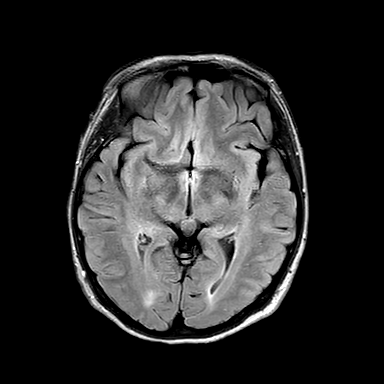
[im 27/27]
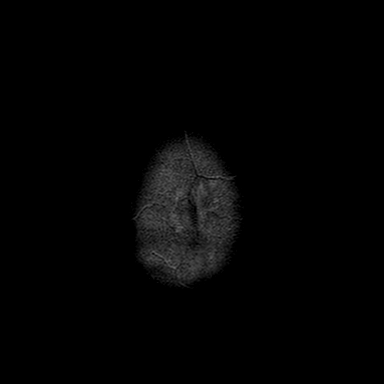

[Series 701: SWI · axial · 3.0mm · 0.53mm/px · z∈[-101,+12]mm · 7 of 100 slices shown]
[im 1/100]
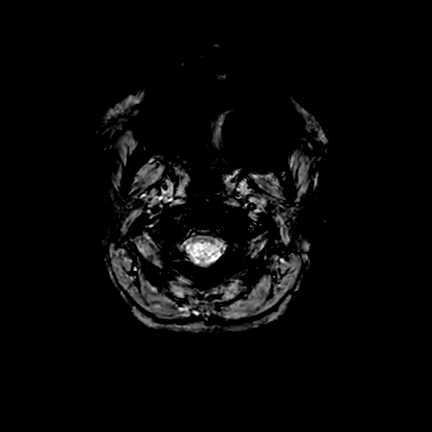
[im 20/100]
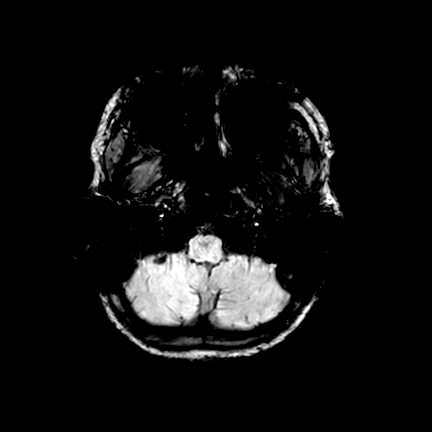
[im 30/100]
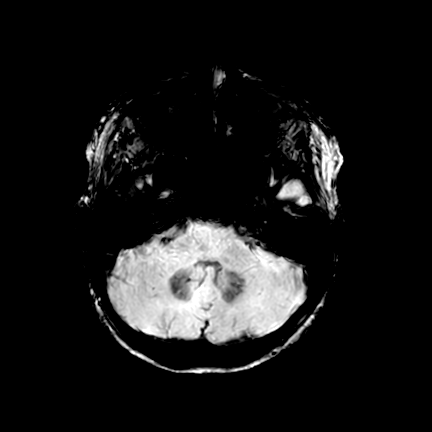
[im 40/100]
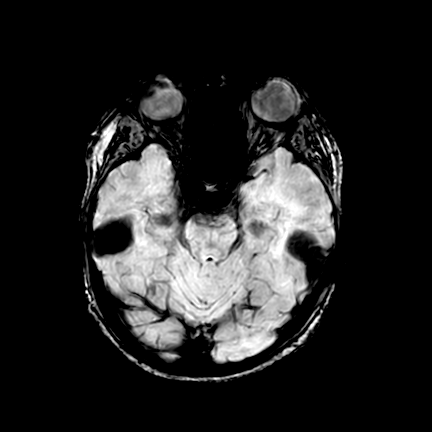
[im 60/100]
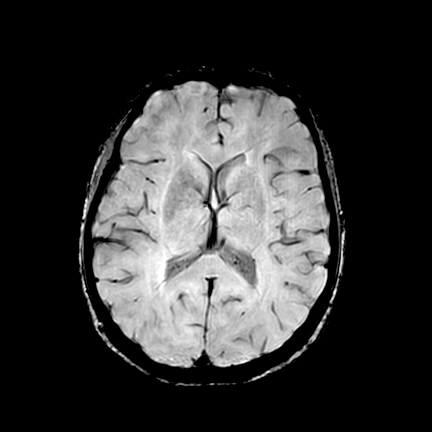
[im 70/100]
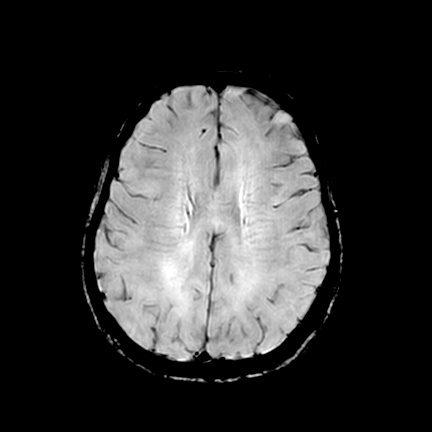
[im 80/100]
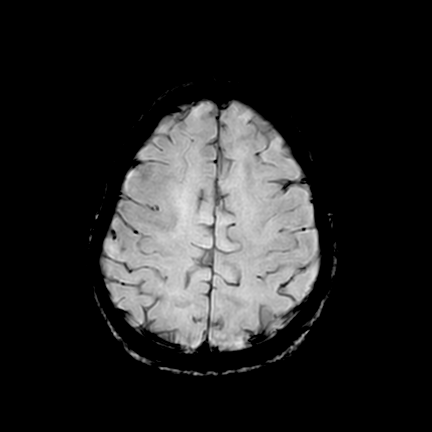

[Series 801: T2 · axial · 5.0mm · 0.41mm/px · z∈[-105,+43]mm · 3 of 27 slices shown]
[im 1/27]
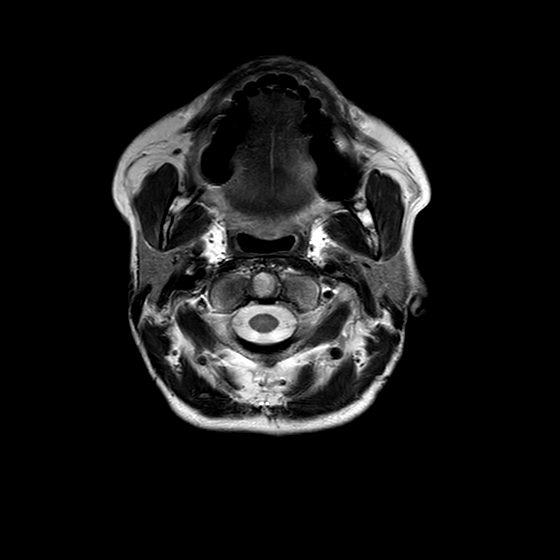
[im 14/27]
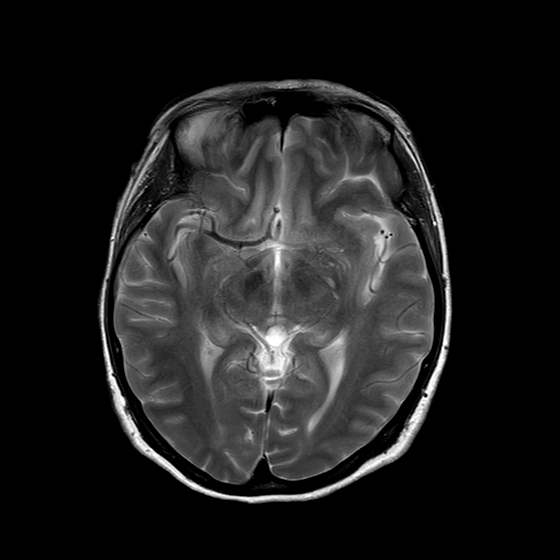
[im 27/27]
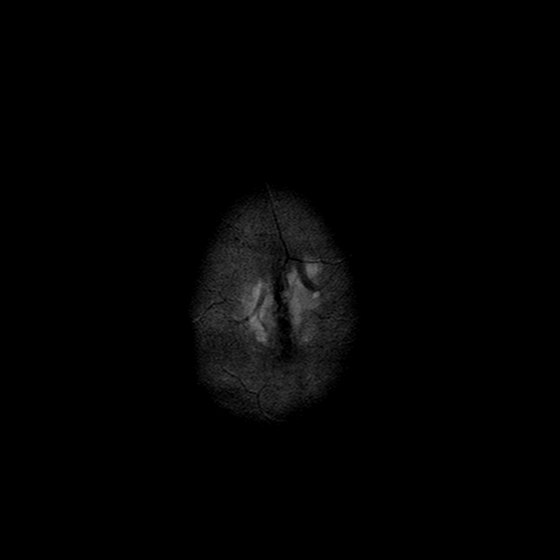

[Series 1001: T1 post-contrast · coronal · 4.0mm · 0.38mm/px · 4 of 36 slices shown (1 of 2)]
[im 1/36]
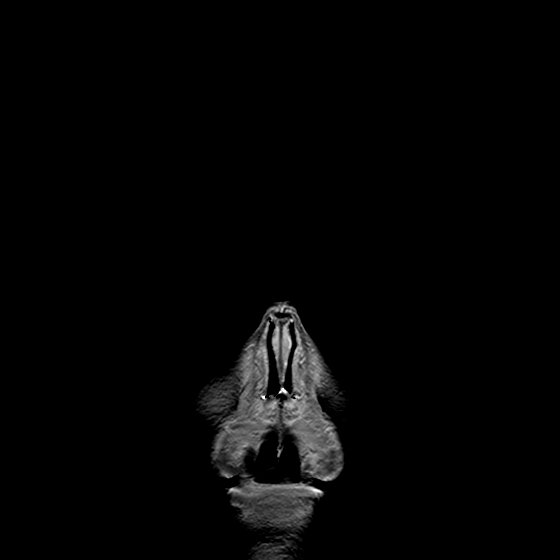
[im 12/36]
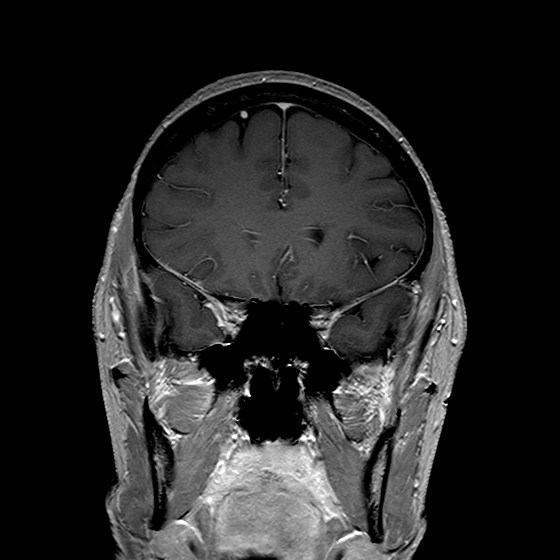
[im 24/36]
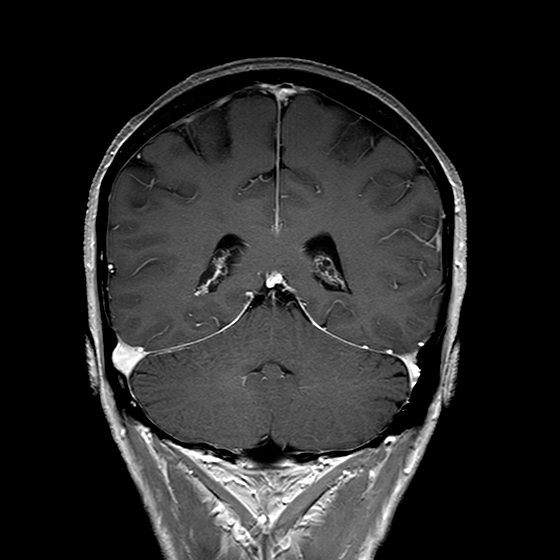
[im 36/36]
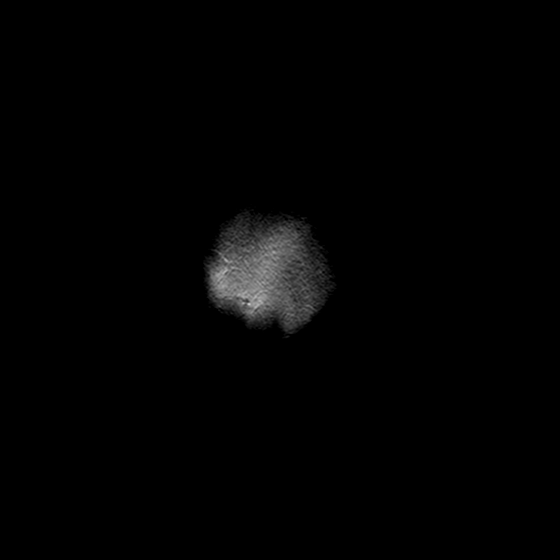

[Series 1101: T1 post-contrast · sagittal · 4.0mm · 0.34mm/px · 3 of 27 slices shown (2 of 2)]
[im 1/27]
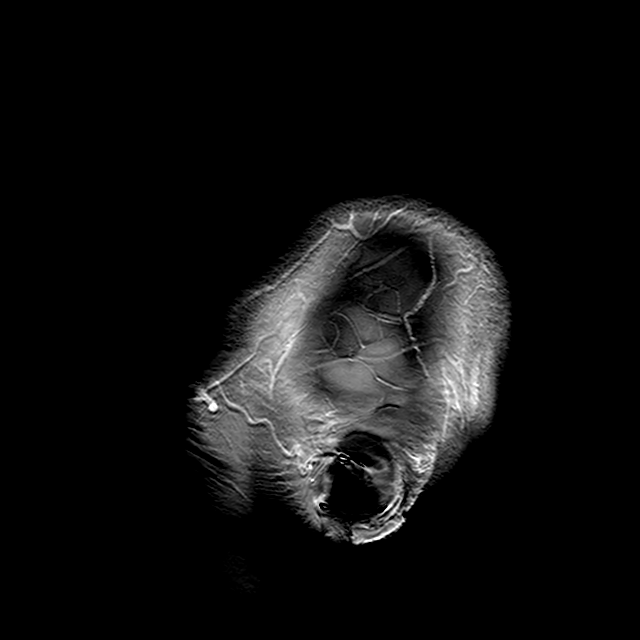
[im 14/27]
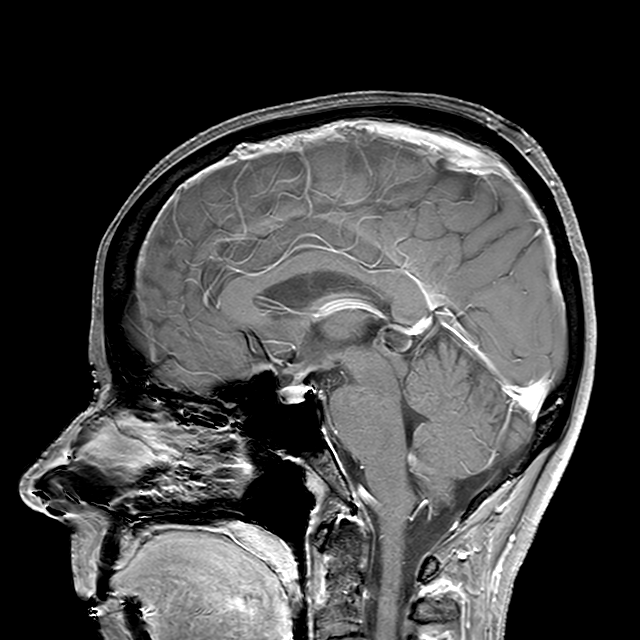
[im 27/27]
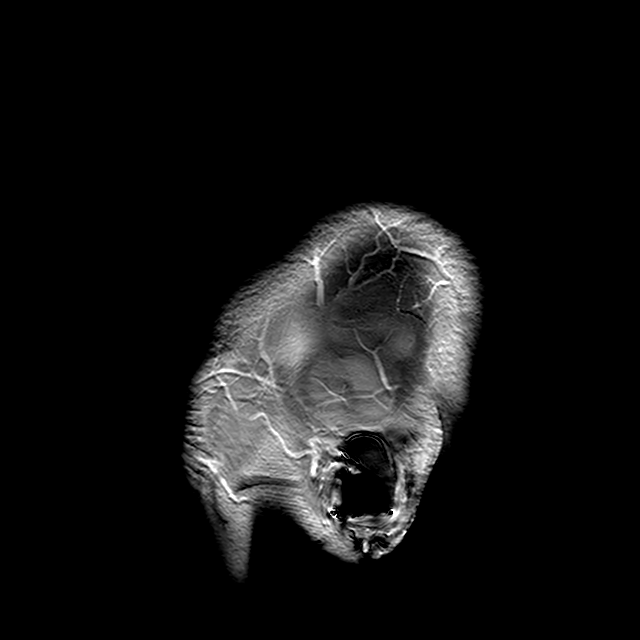

[20 of 48 positions shown; findings below may reference images not displayed]

FINDINGS: Diffusion images are negative. There is a 8 mm pineal region cyst. 
There is no associated enhancement or significant mass effect. No hydrocephalus. 
There are mild to moderate T2 hyperintense signal changes in the cerebral and 
pontine white matter. There is no white matter cavitation. There is no 
pathologic parenchymal or leptomeningeal enhancement. 
Diffusion-weighted images are negative. Susceptibility sequences are negative. 
Major intracranial arterial segments appear open. 
Paranasal sinuses, tympanic cavities and mastoid air cells are clear. There is 
no orbital mass. Sellar contents are normal. Craniocervical junction is open. 
There is no significant atrophy for age.
IMPRESSION: 8 mm pineal region cyst, most likely benign incidental finding. This does not 
produce mass effect. There is no hydrocephalus. 
Mild to moderate cerebral and pontine T2 hyperintense signal changes are 
nonspecific although likely due to chronic microangiopathy. There is no 
pathologic enhancement, diffusion restriction or other findings to indicate 
recent ischemic pathology. 
Major intracranial arterial segments and dural sinuses are open. No evidence for 
aneurysm or vascular malformation.

## 2021-10-20 ENCOUNTER — Ambulatory Visit: Payer: MEDICARE | Primary: Family Medicine

## 2021-11-02 ENCOUNTER — Inpatient Hospital Stay: Admit: 2021-11-02 | Payer: MEDICARE | Attending: Nurse Practitioner | Primary: Family Medicine

## 2021-11-02 MED ORDER — GADOBUTROL 7.5 MMOL/7.5 ML (1 MMOL/ML) IV
7.5 mmol/ mL (1 mmol/mL) | Freq: Once | INTRAVENOUS | Status: AC
Start: 2021-11-02 — End: 2021-11-02
  Administered 2021-11-02: 21:00:00 via INTRAVENOUS

## 2021-11-02 MED FILL — GADAVIST 7.5 MMOL/7.5 ML (1 MMOL/ML) INTRAVENOUS SOLUTION: 7.5 mmol/ mL (1 mmol/mL) | INTRAVENOUS | Qty: 7.5

## 2021-11-04 LAB — AMB POC CREATININE
POC Creatinine: 0.4 mg/dL — ABNORMAL LOW (ref 0.6–1.3)
eGFR, POC: >60 mL/min/{1.73_m2} (ref >60)

## 2021-11-04 LAB — CREATININE, POC
Creatinine (POC): 0.4 mg/dL — ABNORMAL LOW (ref 0.6–1.3)
eGFR (POC): >60 mL/min/{1.73_m2} (ref >60)

## 2021-11-10 NOTE — Telephone Encounter (Signed)
[facility] [place]  [phone]    11/10/21 9:24 AM - Called and left a message letting patient know that her MRI looked good per Jeanine Luz NP.

## 2021-11-10 NOTE — Telephone Encounter (Signed)
Patient called wants to know if her MRI results are in. Request a call back       CB# [phone]

## 2021-12-15 ENCOUNTER — Ambulatory Visit: Payer: MEDICARE | Primary: Family Medicine

## 2022-01-04 ENCOUNTER — Encounter

## 2022-01-04 IMAGING — CT CT CHEST WITHOUT CONTRAST
2 of 4 series · 15 of 36 positions shown, 18 images · non-contrast
Comparison: Comparison was made to the prior exam(s) within the last 12 months 
01/20/2021.

________________________________________________________________________________________________ 
CT CHEST WITHOUT CONTRAST, 01/04/2022 [DATE]: 
CLINICAL INDICATION: Solitary pulmonary nodule. History of left breast carcinoma 
treated in 5475. 
A search for DICOM formatted images was conducted for prior CT imaging studies 
completed at a non-affiliated media free facility.
TECHNIQUE: The chest was scanned from base of neck through the lung bases 
without contrast on a high resolution low dose CT scanner. Routine MPR and MIP 
3D renderings were reconstructed on an independent workstation with concurrent 
physician supervision.

[Series 2: chest 2.0 i31s 3 · axial · 0.65mm/px · z∈[-332,-26]mm · 12 of 169 slices shown, 15 images]
[im 8/169  mediastinal]
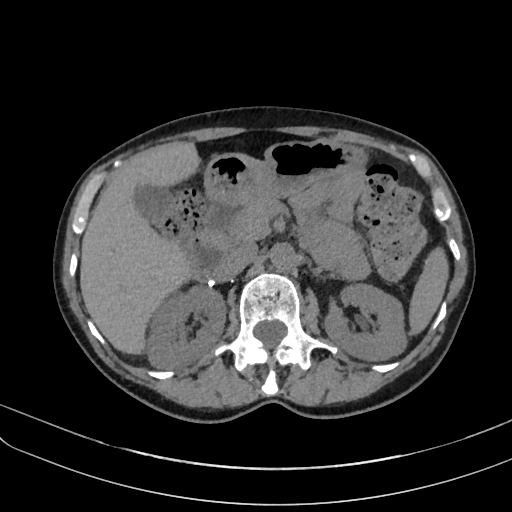
[im 8/169  lung]
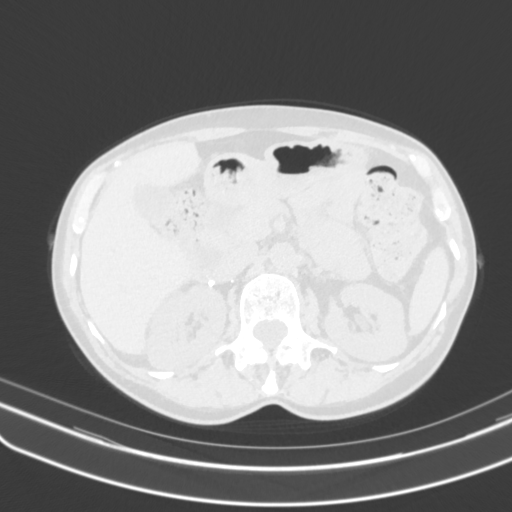
[im 23/169  lung]
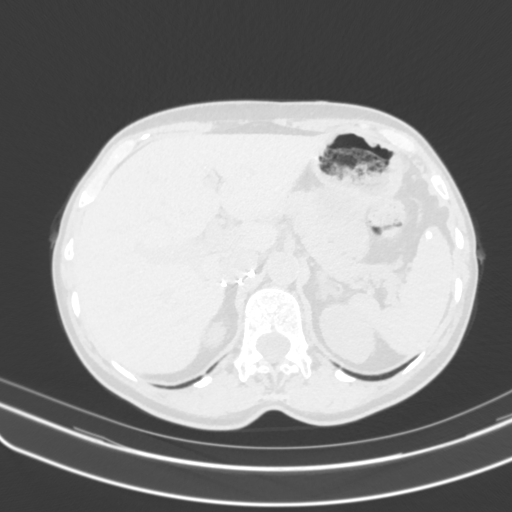
[im 39/169  lung]
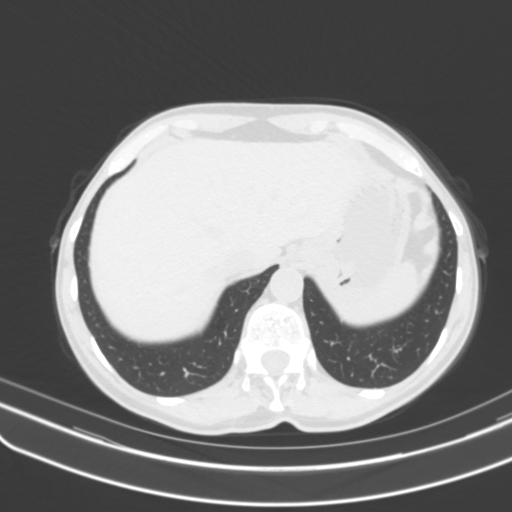
[im 54/169  lung]
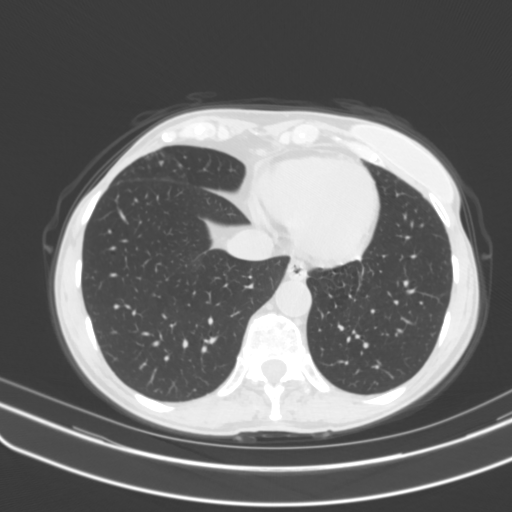
[im 62/169  mediastinal]
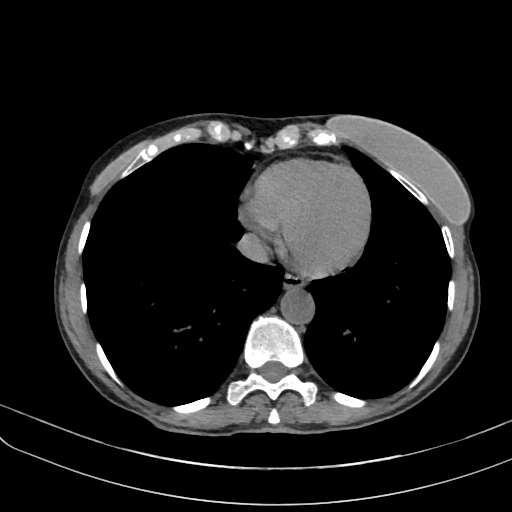
[im 62/169  lung]
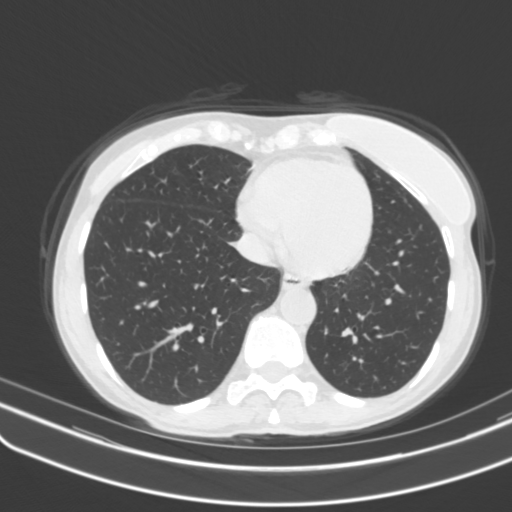
[im 77/169  lung]
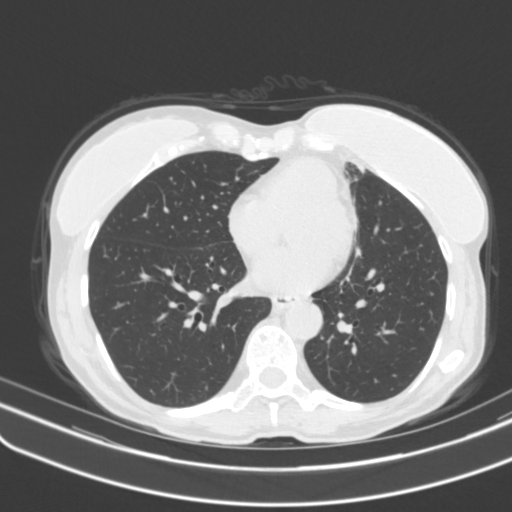
[im 92/169  lung]
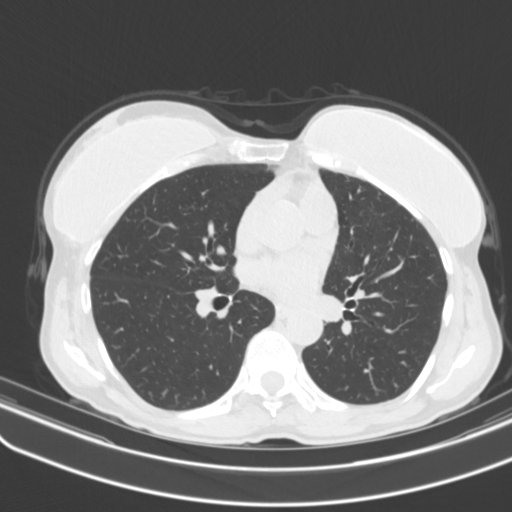
[im 107/169  lung]
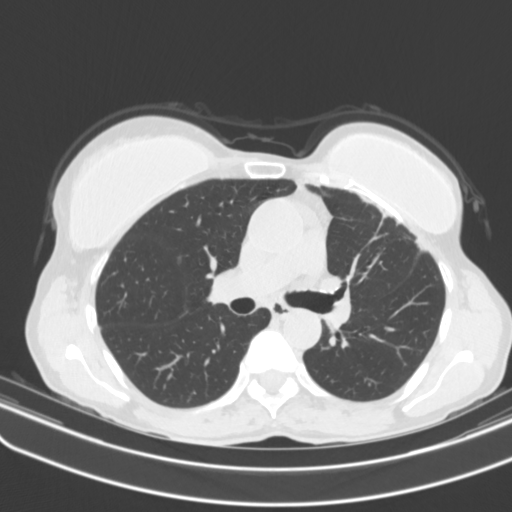
[im 115/169  mediastinal]
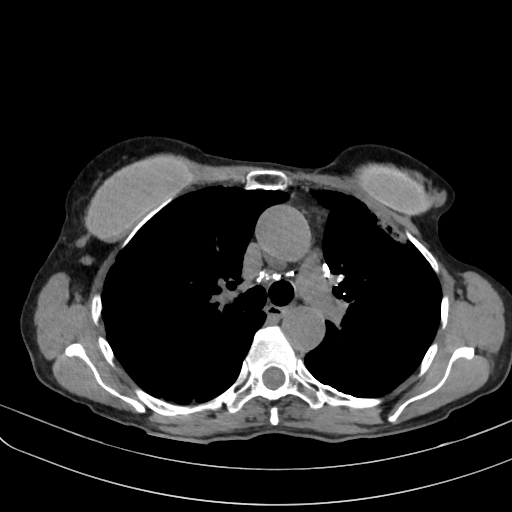
[im 115/169  lung]
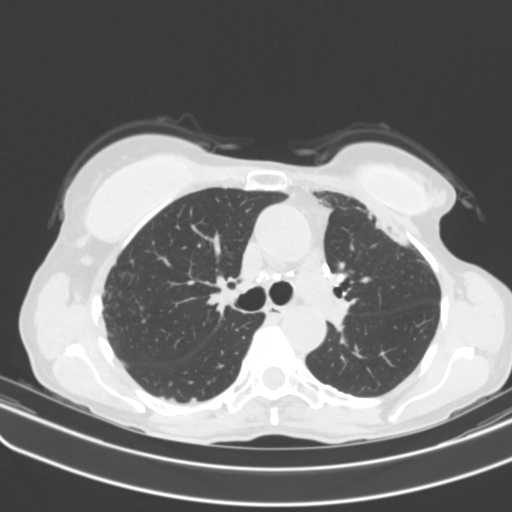
[im 130/169  lung]
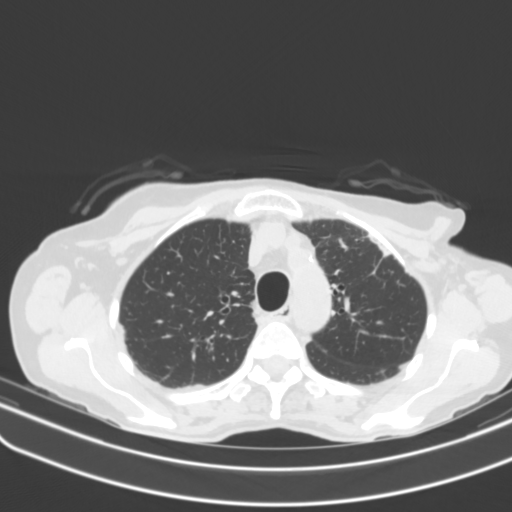
[im 146/169  lung]
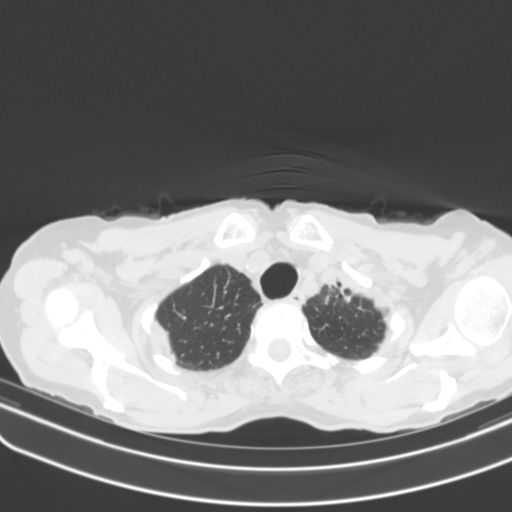
[im 161/169  lung]
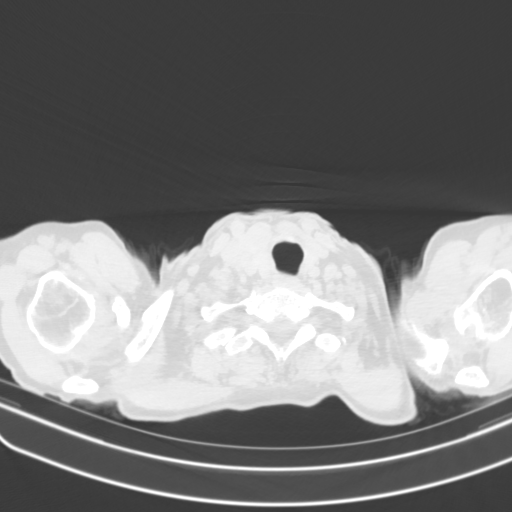

[Series 4: coronal · coronal · 0.69mm/px · 3 of 107 slices shown]
[im 22/107  lung]
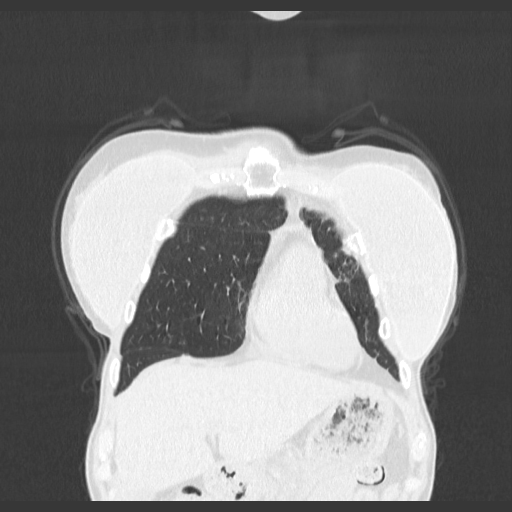
[im 43/107  lung]
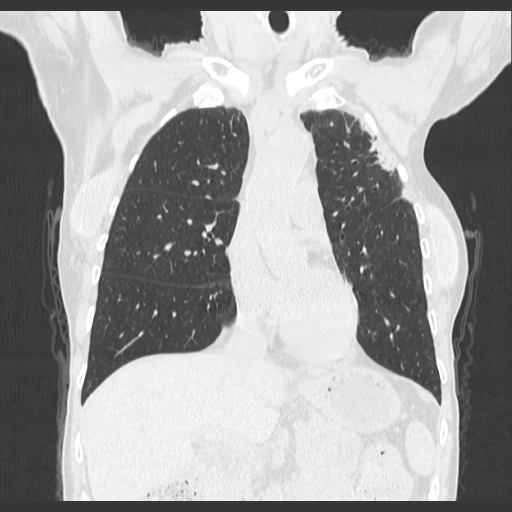
[im 64/107  lung]
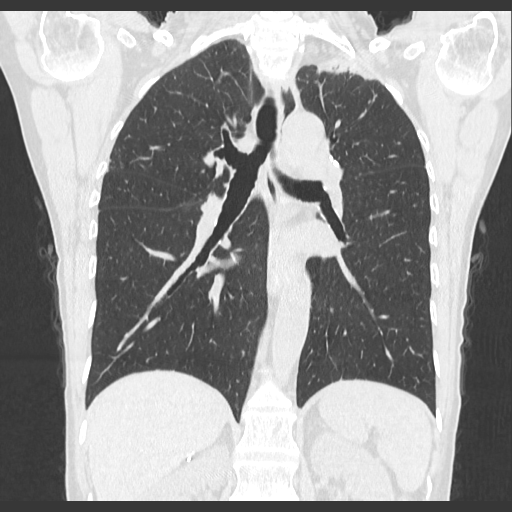

[15 of 36 positions shown; findings below may reference images not displayed]

FINDINGS: LUNGS AND PLEURA:  Again note is made of the subpleural scarring within the 
anterior aspect left upper lobe most likely related to previous radiation 
therapy for left breast carcinoma. The reticular nodular infiltrate within the 
inferior posterior lateral aspect of the right upper lobe is again seen and 
nonspecific for bronchiolitis. The only new abnormality is a 5 mm low-density 
nodule on image 67 within the left lower lobe. Not present last year. 
Significance uncertain. Does require follow-up. No pleural effusion. 
MEDIASTINUM:  Calcified granulomata are identified. No significant coronary 
calcifications. 
CHEST WALL/AXILLA: Bilateral breast implants. Postop changes seen on the left. 
UPPER ABDOMEN: Calcified granulomas in the spleen. Previous right adrenalectomy. 
MUSCULOSKELETAL: Mild degenerative changes without fracture or destructive 
changes seen.
IMPRESSION: Stable changes of bronchiolitis especially in the right upper lobe inferiorly. 
Stable scarring seen on the left. Only new abnormality is a 5 mm left lower lobe 
nodule. Consider short-term follow-up in 6 months. 
Changes of old granulomatous disease. Degenerative changes. Postoperative 
changes. 
RADIATION DOSE REDUCTION: All CT scans are performed using radiation dose 
reduction techniques, when applicable.  Technical factors are evaluated and 
adjusted to ensure appropriate moderation of exposure.  Automated dose 
management technology is applied to adjust the radiation doses to minimize 
exposure while achieving diagnostic quality images.

## 2022-01-05 ENCOUNTER — Encounter

## 2022-01-21 IMAGING — MR MRI CERVICAL SPINE WITHOUT CONTRAST
6 series · 33 of 48 positions shown · IV contrast (gadolinium)
Comparison: None prior of the cervical spine.

________________________________________________________________________________________________ 
MRI CERVICAL SPINE WITHOUT CONTRAST, 01/21/2022 [DATE]: 
CLINICAL INDICATION: Pain involving the neck of right shoulder. Cervical 
spondylosis. Radiculopathy of left cervical region.
TECHNIQUE: Multiplanar, multiecho position MR images of the cervical spine were 
performed without intravenous gadolinium enhancement. Patient was scanned on a 
3T magnet.

[Series 101: survey · axial · 10.0mm · 0.94mm/px · z∈[-15,+150]mm · 4 of 9 slices shown]
[im 1/9]
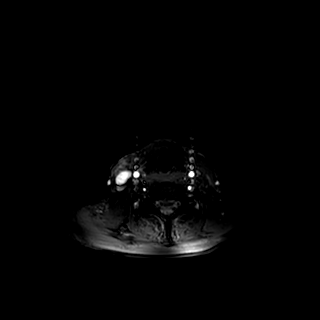
[im 3/9]
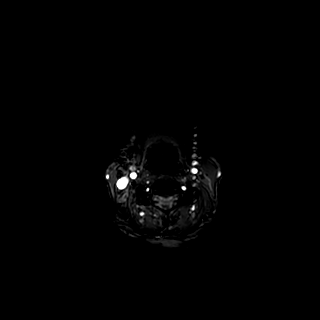
[im 6/9]
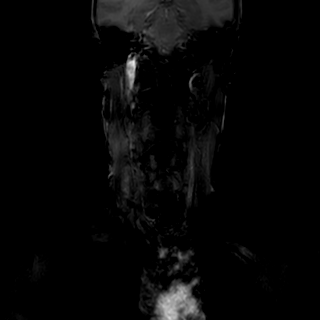
[im 9/9]
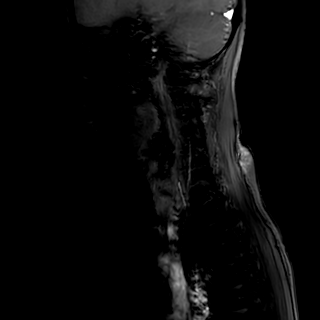

[Series 201: t2w_tse cor · coronal · 5.0mm · 0.52mm/px · 3 of 7 slices shown]
[im 1/7]
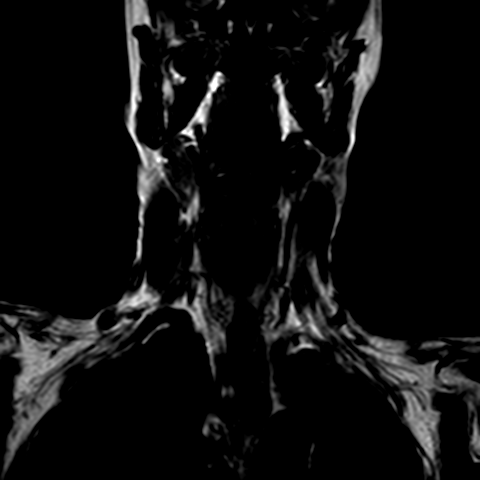
[im 4/7]
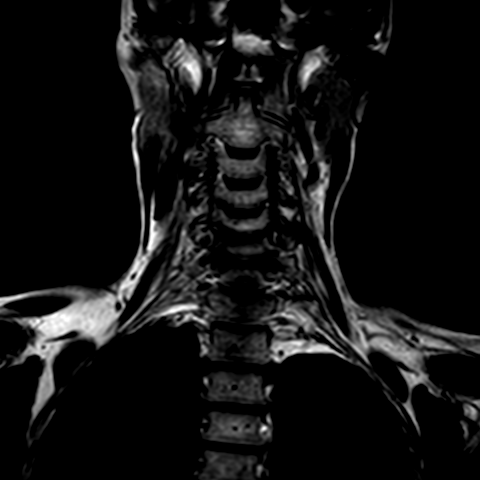
[im 7/7]
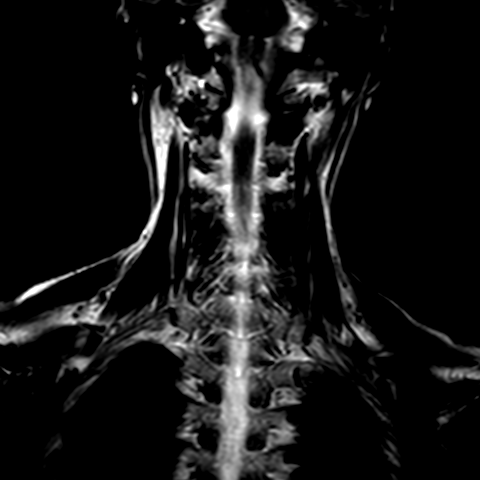

[Series 301: t1w_tse sag · sagittal · 3.0mm · 0.42mm/px · 7 of 15 slices shown]
[im 1/15]
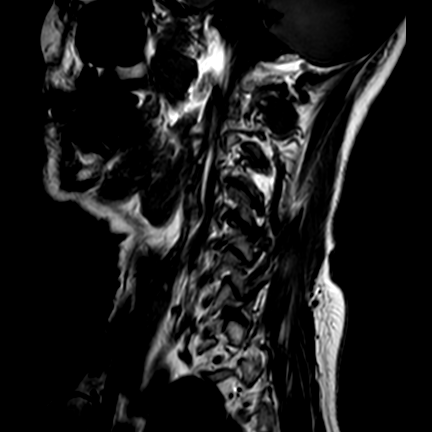
[im 3/15]
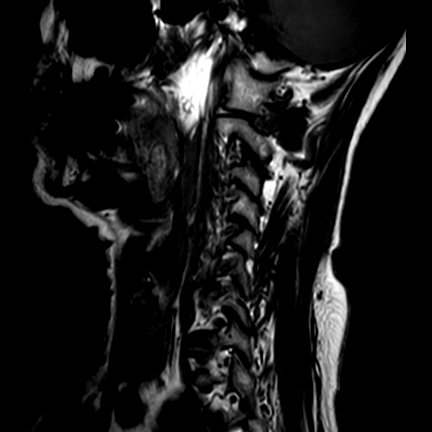
[im 5/15]
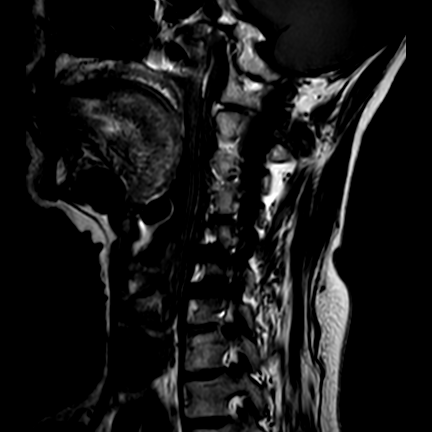
[im 8/15]
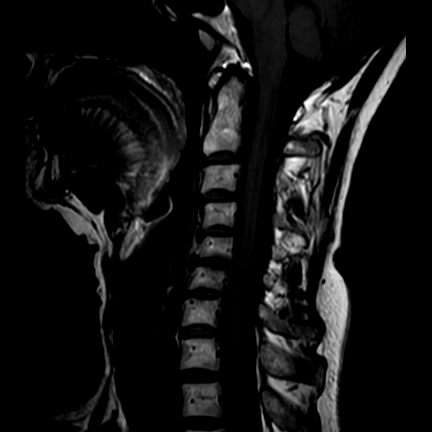
[im 10/15]
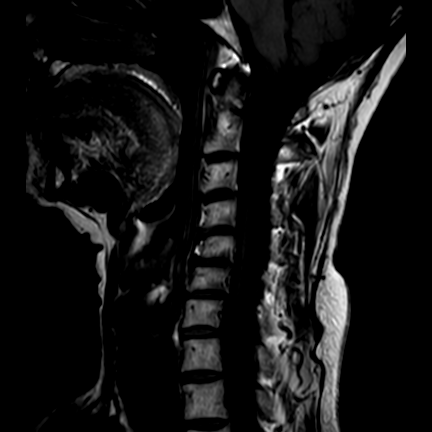
[im 12/15]
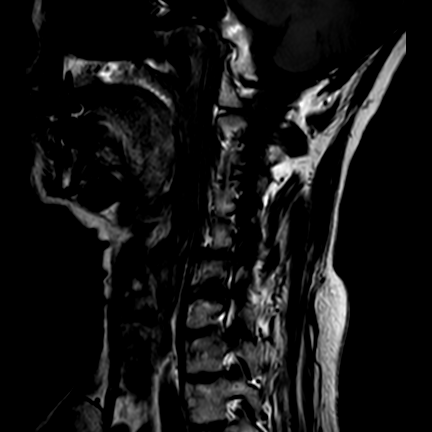
[im 15/15]
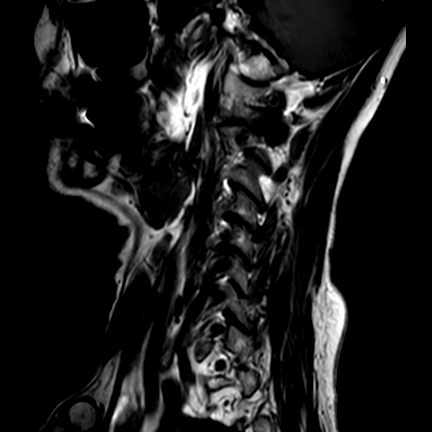

[Series 401: t2w_tse sag · sagittal · 3.0mm · 0.52mm/px · 7 of 15 slices shown]
[im 1/15]
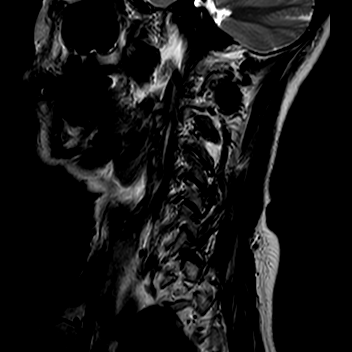
[im 3/15]
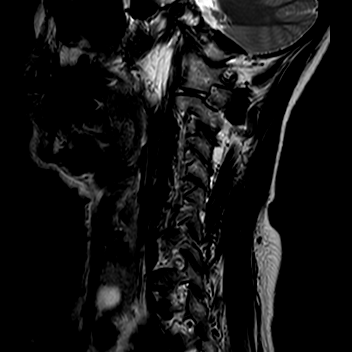
[im 5/15]
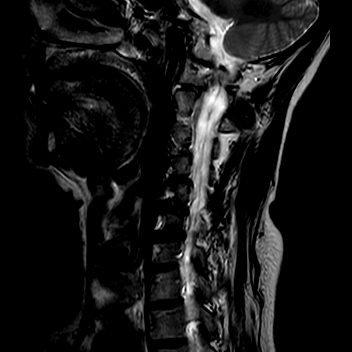
[im 8/15]
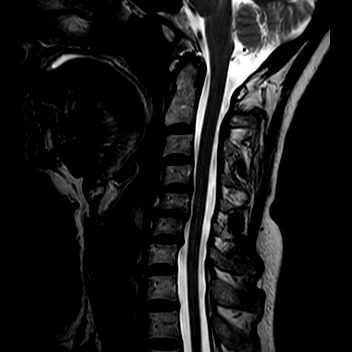
[im 10/15]
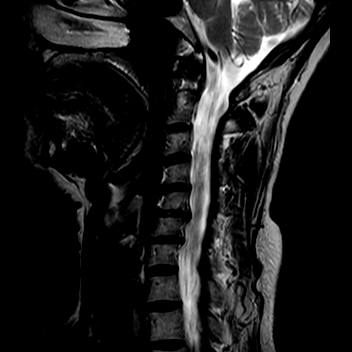
[im 12/15]
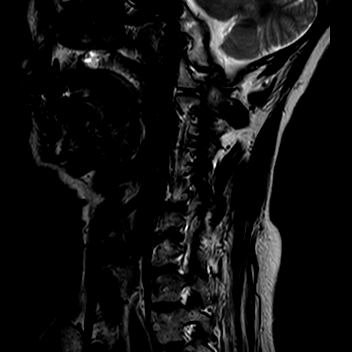
[im 15/15]
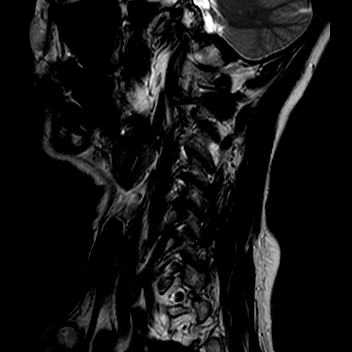

[Series 501: stir_longte sag · sagittal · 3.0mm · 0.62mm/px · 7 of 15 slices shown]
[im 1/15]
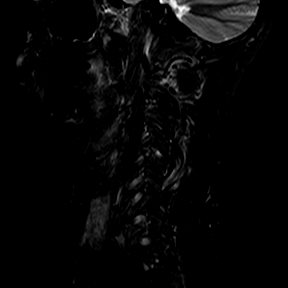
[im 3/15]
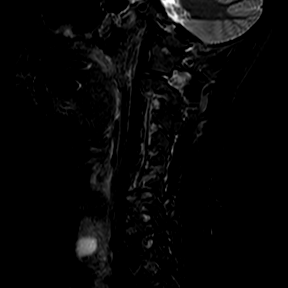
[im 5/15]
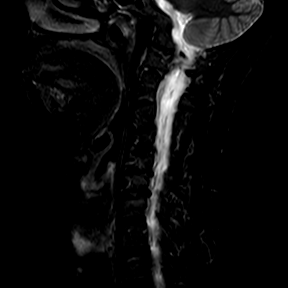
[im 8/15]
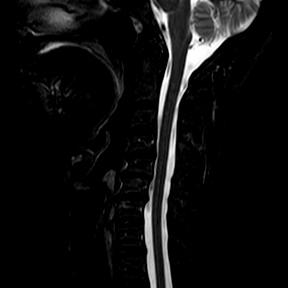
[im 10/15]
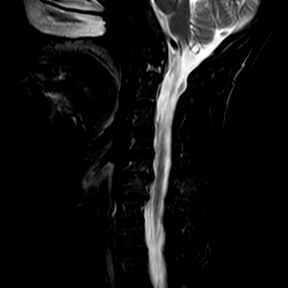
[im 12/15]
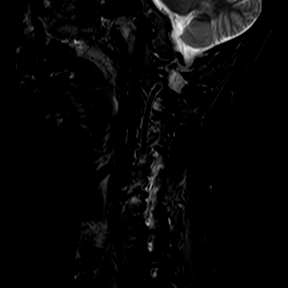
[im 15/15]
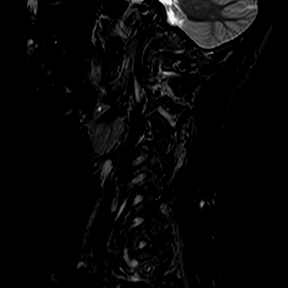

[Series 601: t2w_tse_ax · axial · 3.0mm · 0.36mm/px · z∈[-62,+12]mm · 5 of 45 slices shown]
[im 3/45]
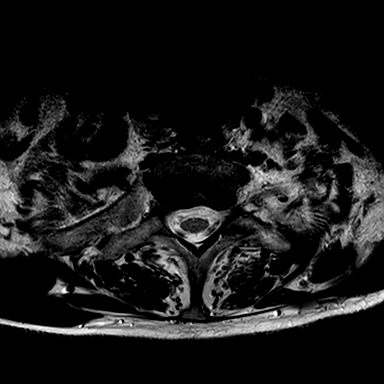
[im 7/45]
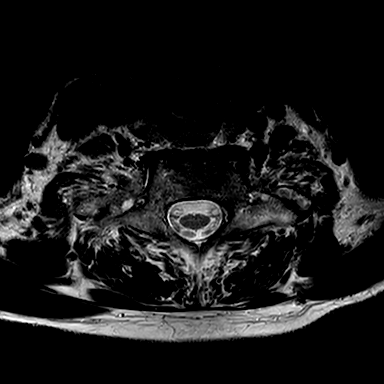
[im 14/45]
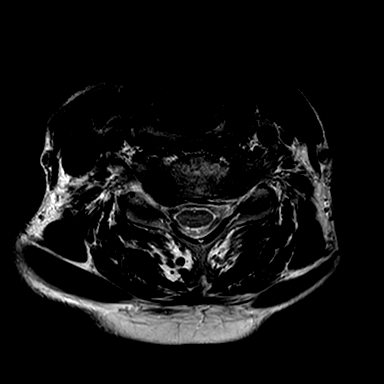
[im 19/45]
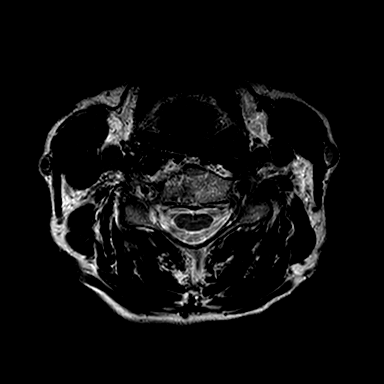
[im 26/45]
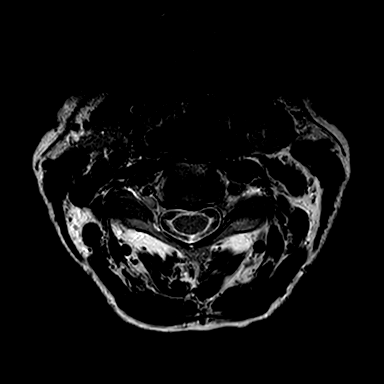

[33 of 48 positions shown; findings below may reference images not displayed]

FINDINGS: -------------------------------------------------------------------------------- 
----------------- 
GENERAL: 
ALIGNMENT: There is straightening of the overall normal cervical lordotic 
curvature. 
VERTEBRAL BODY HEIGHT: Normal.  
MARROW SIGNAL: No focal suspect signal abnormality. 
CORD SIGNAL: Normal.  
ADDITIONAL FINDINGS: Heterogeneous signal intensity within the right side of the 
thyroid lobe, only partially included. 
-------------------------------------------------------------------------------- 
---------------- 
SEGMENTAL: 
C2-C3: Mild disc desiccation without disc height loss. No herniation. Normal 
facets. No spinal canal or neural foraminal stenosis. 
C3-C4: Mild disc desiccation without disc height loss. No herniation. Mild facet 
hypertrophy. No spinal canal or neural foraminal stenosis. 
C4-C5: Mild disc desiccation without disc height loss. No herniation. Mild facet 
hypertrophy. No spinal canal or neural foraminal stenosis. 
C5-C6: Moderate disc desiccation and mild disc height loss. Mild dorsal disc 
osteophyte ridging. Mild facet hypertrophy. No spinal canal or neural foraminal 
stenosis. 
C6-C7: Moderate disc desiccation and disc height loss. Mild dorsal disc 
osteophyte ridging. Mild uncovertebral and facet hypertrophy. No spinal canal or 
neural foraminal stenosis. 
C7-T1: Normal disc height. No herniation. Normal facets. No spinal canal or 
neural foraminal stenosis. 
-------------------------------------------------------------------------------- 
---------------
IMPRESSION: 1.  Mild spondylotic changes cervical spine with mild straightening of the 
normal cervical lordotic curvature. 
2.  No spinal canal or neural foraminal stenoses. 
3.  Thyroid gland is partially included with heterogeneous signal intensity. 
Consideration could be made for thyroid ultrasound exam.

## 2022-02-01 ENCOUNTER — Encounter

## 2022-02-09 ENCOUNTER — Inpatient Hospital Stay: Admit: 2022-02-09 | Payer: MEDICARE

## 2022-02-09 ENCOUNTER — Inpatient Hospital Stay: Payer: MEDICARE | Primary: Family Medicine

## 2022-02-09 DIAGNOSIS — Z79899 Other long term (current) drug therapy: Secondary | ICD-10-CM

## 2022-02-09 DIAGNOSIS — C50312 Malignant neoplasm of lower-inner quadrant of left female breast: Secondary | ICD-10-CM

## 2022-02-09 LAB — PHOSPHORUS: Phosphorus: 4 MG/DL (ref 2.6–4.7)

## 2022-02-09 LAB — POC CHEM 8
Anion Gap, POC: 8 mmol/L — ABNORMAL LOW (ref 10–20)
POC Chloride: 96 mmol/L — ABNORMAL LOW (ref 98–107)
POC Creatinine: 0.58 mg/dL — ABNORMAL LOW (ref 0.6–1.3)
POC Glucose: 85 mg/dL (ref 65–100)
POC Ionized Calcium: 1.17 mmol/L (ref 1.12–1.32)
POC Potassium: 4.1 mmol/L (ref 3.5–5.1)
POC Sodium: 132 mmol/L — ABNORMAL LOW (ref 136–145)
POC TCO2: 28.8 mmol/L (ref 21–32)
eGFR, POC: >60 mL/min/{1.73_m2} (ref >60)

## 2022-02-09 LAB — MAGNESIUM: Magnesium: 1.9 mg/dL (ref 1.6–2.4)

## 2022-02-09 MED ORDER — DENOSUMAB 60 MG/ML SC SOSY
60 MG/ML | Freq: Once | SUBCUTANEOUS | Status: AC
Start: 2022-02-09 — End: 2022-02-09
  Administered 2022-02-09: 18:00:00 60 mg via SUBCUTANEOUS

## 2022-02-09 MED FILL — PROLIA 60 MG/ML SC SOSY: 60 MG/ML | SUBCUTANEOUS | Qty: 1

## 2022-02-09 NOTE — Progress Notes (Signed)
[facility] Progress Note    Date: Feb 09, 2022    Name: Emily [name]    MRN: [id number]         DOB: 1951-05-24    [name] arrived ambulatory and in no distress for Prolia Injection.  Assessment was completed, no acute issues at this time, no new complaints voiced.  Labs were drawn peripherally and sent for processing.  Patient denies any recent dental work.        [name]'s vitals were reviewed.  Vitals:    02/09/22 1300   BP: (!) 154/74   Pulse: 63   Resp: 20   Temp: 98.2 F (36.8 C)   SpO2: 100%         Recent Results (from the past 12 hour(s))   Phosphorus    Collection Time: 02/09/22  1:24 PM   Result Value Ref Range    Phosphorus 4.0 2.6 - 4.7 MG/DL   Magnesium    Collection Time: 02/09/22  1:24 PM   Result Value Ref Range    Magnesium 1.9 1.6 - 2.4 mg/dL   POC CHEM 8    Collection Time: 02/09/22  1:24 PM   Result Value Ref Range    POC Ionized Calcium 1.17 1.12 - 1.32 mmol/L    POC Sodium 132 (L) 136 - 145 mmol/L    POC Potassium 4.1 3.5 - 5.1 mmol/L    POC Chloride 96 (L) 98 - 107 mmol/L    POC TCO2 28.8 21 - 32 mmol/L    Anion Gap, POC 8 (L) 10 - 20 mmol/L    POC Glucose 85 65 - 100 mg/dL    POC Creatinine 0.58 (L) 0.6 - 1.3 mg/dL    eGFR, POC >60 >60 ml/min/1.58m    UA Comment Comment Not Indicated.          Medications:  MEDICATIONS GIVEN:  Medications Administered         denosumab (PROLIA) SC injection 60 mg Admin Date  02/09/2022 Action  Given Dose  60 mg Route  SubCUTAneous Administered By  NCarola Frost RN             Ms. GGouveiatolerated treatment well and [facility].   She currently has no future appointments and states she will make an appointment when appropriate.    NCarola Frost RN  Feb 09, 2022

## 2022-03-07 NOTE — Progress Notes (Signed)
Formatting of this note is different from the original.  Subjective:   [name] is a 71 y.o. female.    Chief Complaint: Pain of the Right Shoulder    History of Present Illness:  Emily [name] presents today for right shoulder pain.  Patient reports atraumatic pain for several months.  She intermittently received Synvisc injections.  Her most recent injection was roughly 6 months ago.  She is now reporting increased pain in the shoulder with associated stiffness.  She is requesting Synvisc injections.  Denies radiating pain.  Denies numbness or tingling    Past Medical History:   Diagnosis Date    Arthritis, rheumatoid     Cancer     Migraine     Osteoarthritis     Pulmonary disorder      Past Surgical History:   Procedure Laterality Date    ADRENALECTOMY      APPENDECTOMY      CESAREAN SECTION      COLONOSCOPY      MASTECTOMY      OOPHORECTOMY      right    PR SURGICAL ARTHROSCOPY SHOULDER XTNSV DBRDMT 3+ Right 11/11/2020    Procedure: ARTHROSCOPY, SHOULDER, SURGICAL; DEBRIDEMENT, EXTENSIVE;  Surgeon: Chiquita Loth, MD;  Location: [facility];  Service: Orthopedics    SHLDR ARTHROSCOP,FULL SYNOVECT Right 11/11/2020    Procedure: ARTHROSCOPY, SHOULDER, SURGICAL; SYNOVECTOMY, COMPLETE;  Surgeon: Chiquita Loth, MD;  Location: [facility];  Service: Orthopedics    SHLDR ARTHROSCOP,LYSE ADHESNS Right 11/11/2020    Procedure: ARTHROSCOPY, SHOULDER, SURGICAL; WITH LYSIS AND RESECTION OF ADHESIONS, WITH OR WITHOUT MANIPULATION;  Surgeon: Chiquita Loth, MD;  Location: [facility];  Service: Orthopedics    SHOULDER ARTHROSCOPY      SHOULDER SURGERY      TONSILLECTOMY       Family History   Problem Relation Age of Onset    Diabetes Mother     Diabetes Father     Coronary artery disease Father     No Known Problems Brother     No Known Problems Sister     No Known Problems Son     No Known Problems Daughter     Clotting disorder Neg Hx     Anesthesia problems Neg Hx        03/08/2022     3:00 PM   ROS   Immunological:  Seasonal Allergies  Positive   Musculoskeletal: Joint Pain Positive     Current Outpatient Medications:     albuterol HFA (PROVENTIL HFA;VENTOLIN HFA) 108 (90 Base) MCG/ACT inhaler, INHALE 2 PUFFS BY MOUTH 4 TIMES A DAY AS NEEDED, Disp: , Rfl:     DULoxetine (CYMBALTA) 30 MG capsule, Take 30 mg by mouth 2 (two) times a day, Disp: , Rfl: 3    fluconazole (DIFLUCAN) 200 MG tablet, , Disp: , Rfl:     Fluticasone Furoate-Vilanterol 100-25 MCG/ACT aerosol powder , ceived the following from [facility]: Outside name: Breo Ellipta 100-25 mcg/dose inhaler, Disp: , Rfl:     hydrocortisone 2.5 % cream, hydrocortisone 2.5 % topical cream with perineal applicator  APPLY BY RECTAL ROUTE DAILY AS NEEDED FOR 30 DAYS, Disp: , Rfl:     Hydrocortisone, Perianal, (Proctosol HC) 2.5 % cream, Proctosol HC 2.5 % topical cream perineal applicator  APPLY BY RECTAL ROUTE DAILY AS NEEDED FOR 30 DAYS, Disp: , Rfl:     hydrOXYzine (ATARAX) 25 MG tablet, , Disp: , Rfl:  HyperSal 7 % nebulizer solution, 1 (ONE) VIAL TWO TIMES DAILY, AFTER ALBUTEROL, WITH AEROBIKA, Disp: , Rfl:     levalbuterol (XOPENEX) 1.25 MG/3ML nebulizer solution, USE 1 VIAL (3ML) 3 TIMES A DAY AS NEEDED VIA NEBULIZER, Disp: , Rfl:     Nurtec 75 MG tablet dispersible, PLACE 1 TABLET ON THE TONGUE AND ALLOW TO DISSOLVE AS NEEDED ONCE A DAY, Disp: , Rfl:     oxyCODONE (ROXICODONE) 5 MG immediate release tablet, 1-2 TABLET FOUR TIMES A DAY FOR 30 DAYS, Disp: , Rfl:     tobramycin-dexamethasone (TOBRADEX) ophthalmic solution, , Disp: , Rfl:     trimethoprim-polymyxin b (POLYTRIM) ophthalmic solution, polymyxin B sulfate 10,000 unit-trimethoprim 1 mg/mL eye drops  INSTILL 1 DROP INTO AFFECTED EYE EVERY 6 HOURS, Disp: , Rfl:     valACYclovir (VALTREX) 1 g tablet, TAKE 2 TABLETS BY MOUTH NOW AND 2 TABS IN 12 HOURS ONE DAY ONLY, Disp: , Rfl:     zolpidem (AMBIEN) 10 MG tablet, TAKE 1 TABLET BY MOUTH EVERYDAY AT BEDTIME AS NEEDED FOR SLEEP, Disp: , Rfl:   Allergies    Allergen Reactions    Cephalexin      Other reaction(s): Rash    Prochlorperazine      Other reaction(s): Rash    Aspirin Other (see comments) and Palpitations     Other reaction(s): Unknown (comments)  Gi bleeding    Nsaids Swelling, Other (see comments) and Palpitations     swelling     Social History     Occupational History    Not on file   Tobacco Use    Smoking status: Never    Smokeless tobacco: Never   Substance and Sexual Activity    Alcohol use: No    Drug use: No    Sexual activity: Not on file       Objective:     Vitals:    03/07/22 1450   BP: 120/75   Weight: 115 lb   Height: '5\' 5"'$      Constitutional:  No acute distress. Well nourished. Well developed.  Head/Neck:  Atraumatic.  Normocephalic  Skin: Warm and dry. No rashes noted.   Respiratory:  No labored breathing.  Cardiovascular:  No marked edema.  Musculoskeletal      Right shoulder-no skin changes or lesions.  Forward elevation 120 degrees, 90 degrees abduction, 45 degrees external rotation, internal rotation to L5.  5/5 supraspinatus strength, 5/5 external rotators  Radiographs:         No imaging obtained     Assessment:       ICD-10-CM   1. Primary osteoarthritis of right shoulder  M19.011       Patient Active Problem List   Diagnosis    Arthritis of both knees    Breast cancer, stage 2    Cellulitis of left arm    Lymphedema of left arm    Intractable chronic paroxysmal hemicrania    Malignant neoplasm of lower-inner quadrant of left female breast    Pheochromocytoma    VIN III (vulvar intraepithelial neoplasia III)     Plan:   She has mild degenerative changes on radiographs.  Patient has had good relief with Synvisc injections in the past.  I discussed with her we do not keep these and stock so we will have to order for her.  Once the order for the injection is in we will call the patient for the injection.  In the meantime I will start her  on a course of formal physical therapy    Patient expresses understanding and is in agreement with  the plan.  If she has any problems at all in the meantime, if symptoms worsen or new symptoms develop, she should let us know.    Supervising Physician: Chiquita Loth, MD     Procedures:    Procedures    Orders Placed This Encounter    Ambulatory referral to Physical Therapy    BP Patient Education     No follow-ups on file.       Lyndee Leo, PA   Electronically signed by Lyndee Leo, PA-C at 03/07/2022  3:30 PM EDT

## 2022-03-10 ENCOUNTER — Encounter

## 2022-03-30 NOTE — Progress Notes (Signed)
Associated Order(s): Large Joint Arthrocentesis: R glenohumeral  Post-Procedure Diagnose(s): Primary osteoarthritis of right shoulder  Formatting of this note is different from the original.    Subjective:   [name] is a 71 y.o. female.  Pain Scale: Pain rating = 8  out of 10     Chief Complaint: Pain of the Right Shoulder    Here for follow-up of shoulder.  She is ready to have her hyaluronic acid injection    Review of Systems   03/30/2022    Immunological: Seasonal Allergies: Positive  Musculoskeletal: Joint Pain: Positive     Objective:   Constitutional:  No acute distress. Well nourished. Well developed.  Eyes:  Sclera are nonicteric.  Respiratory:  No labored breathing.  Cardiovascular:  No marked edema.  Skin:  No marked skin ulcers.  Neurological:  No marked sensory loss noted.  Psychiatric: Alert and oriented x3.  Musculoskeletal   Painful range of motion for elevation 160,4/5 supra    Radiographs:         No imaging obtained     Assessment:     1. Primary osteoarthritis of right shoulder        Plan:   Today will give Orthovisc '48MG'$ /6ML prefilled injection, will see her back as needed    Patient expresses understanding and is in agreement with the plan.  If she has any problems at all in the meantime, if symptoms worsen or new symptoms develop, she should let us know.    Large Joint Arthrocentesis: R glenohumeral  Performed by: Chiquita Loth, MD  Authorized by: Chiquita Loth, MD      Consent given by: patient  Site marked: site marked  Timeout: Immediately prior to procedure a time out was called to verify the correct patient, procedure, equipment, support staff and site/side marked as required   Supporting Documentation  Indications: pain   Procedure Details  Location:  Shoulder R glenohumeral   Preparation: ethylchloride used.    Additional Procedural Details:  Medication used- Orthovisc 48 mg/6 ml prefilled injection - NDC [address]-22  LOT [id number]  Exp 2024/01/01  Needle size:  22 G    Approach: posterior  Patient tolerance: patient tolerated the procedure well with no immediate complications    Medications administered: 9 mL lidocaine 1 %    Patient Education: Cortisone Flare Risk Discussed    No orders of the defined types were placed in this encounter.    No follow-ups on file.     Patrcia Dolly, MD   Electronically signed by Chiquita Loth, MD at 08/04/2022  7:13 PM EDT

## 2022-04-07 IMAGING — CT CT CHEST WITHOUT CONTRAST
2 of 4 series · 15 of 36 positions shown, 18 images · non-contrast
Comparison: CT exam of 01/04/2022.

________________________________________________________________________________________________ 
CT CHEST WITHOUT CONTRAST, 04/07/2022 [DATE]: 
CLINICAL INDICATION: [Productive cough for 3 weeks. Follow-up pulmonary nodule.. 
History of left breast cancer with mastectomy, radiation chemotherapy. 
A search for DICOM formatted images was conducted for prior CT imaging studies 
completed at a non-affiliated media free facility.
TECHNIQUE: The chest was scanned from base of neck through the lung bases 
without contrast on a high resolution low dose CT scanner. Routine MPR and MIP 
3D renderings were reconstructed on an independent workstation with concurrent 
physician supervision.

[Series 2: chest 2.0 i31s 3 · axial · 0.62mm/px · z∈[-343,-27]mm · 12 of 174 slices shown, 15 images]
[im 8/174  mediastinal]
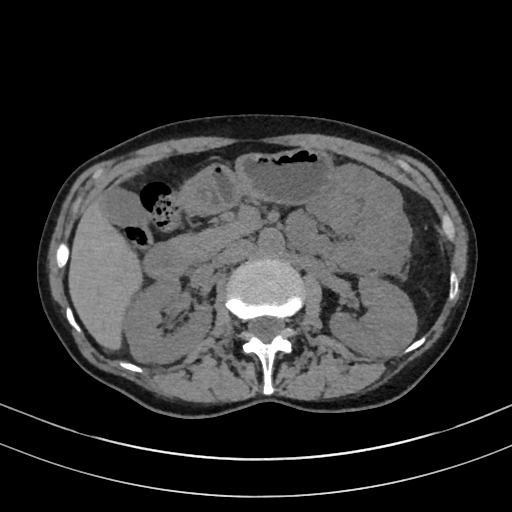
[im 8/174  lung]
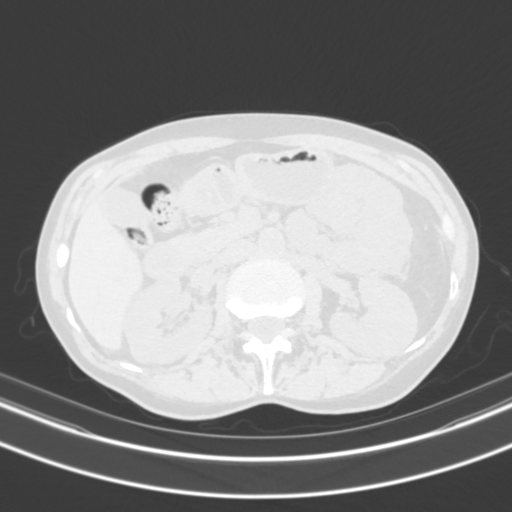
[im 24/174  lung]
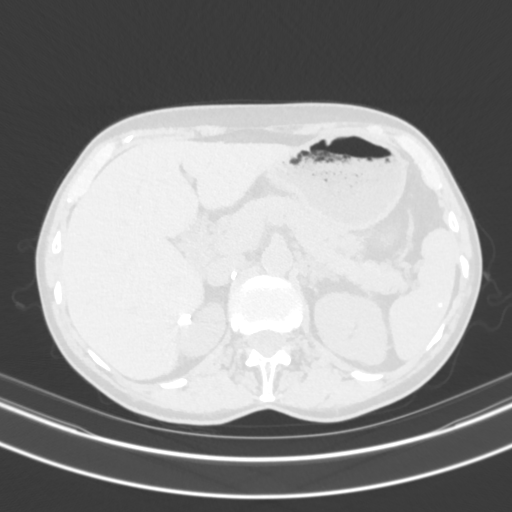
[im 40/174  lung]
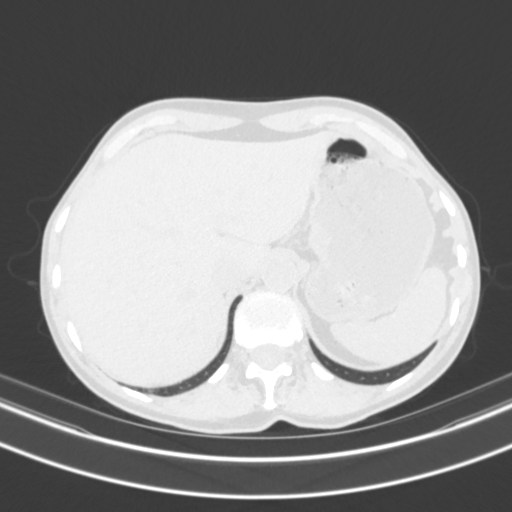
[im 56/174  lung]
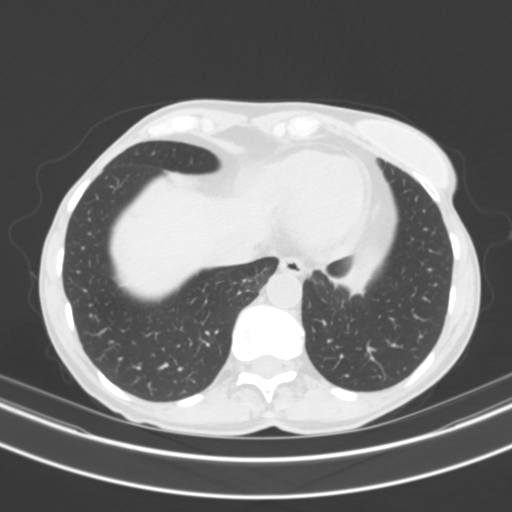
[im 63/174  mediastinal]
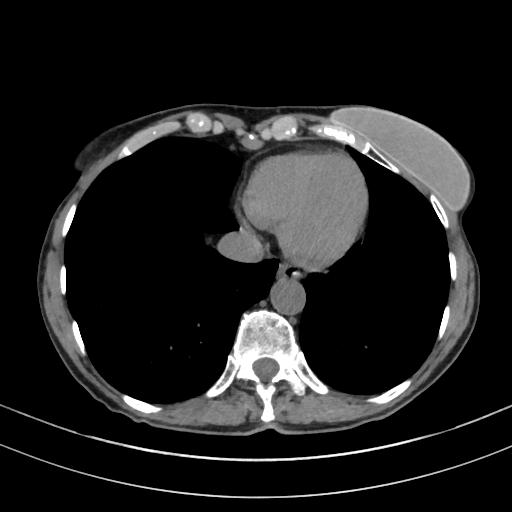
[im 63/174  lung]
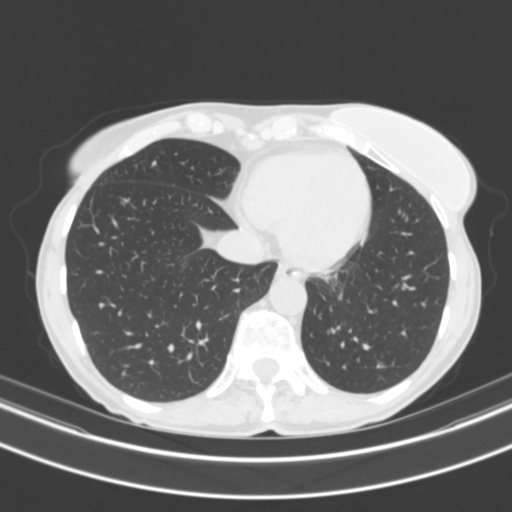
[im 79/174  lung]
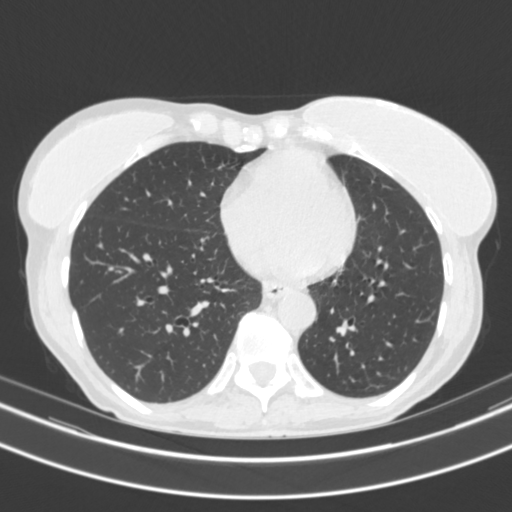
[im 95/174  lung]
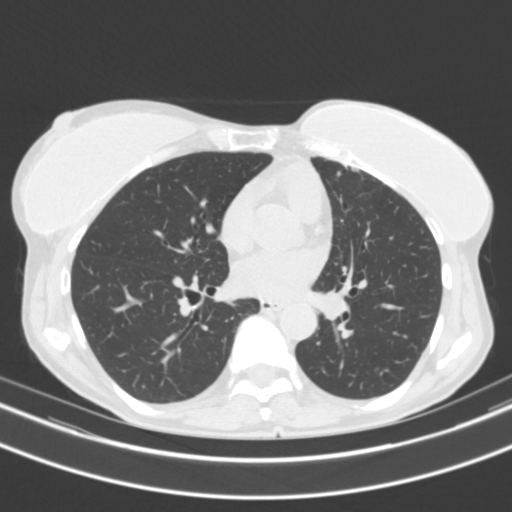
[im 111/174  lung]
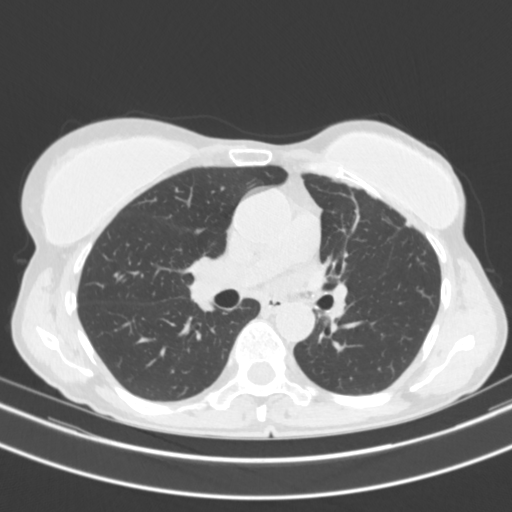
[im 118/174  mediastinal]
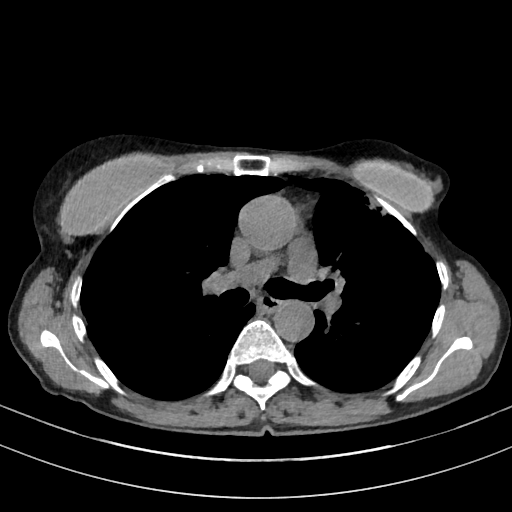
[im 118/174  lung]
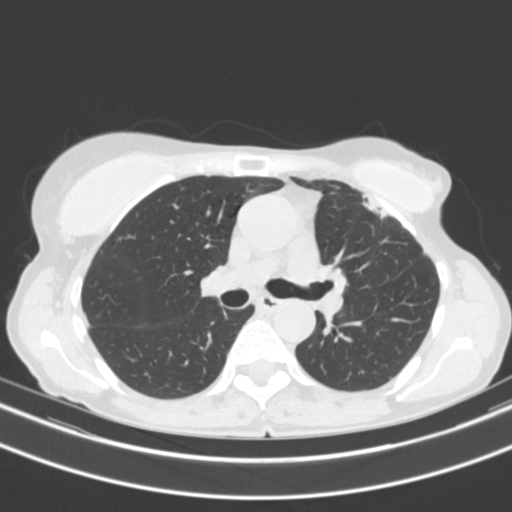
[im 134/174  lung]
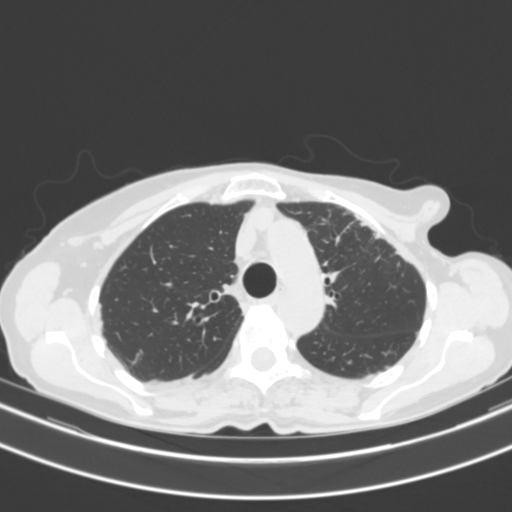
[im 150/174  lung]
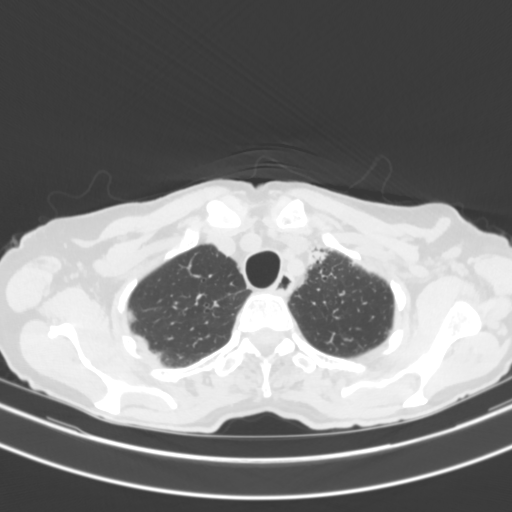
[im 166/174  lung]
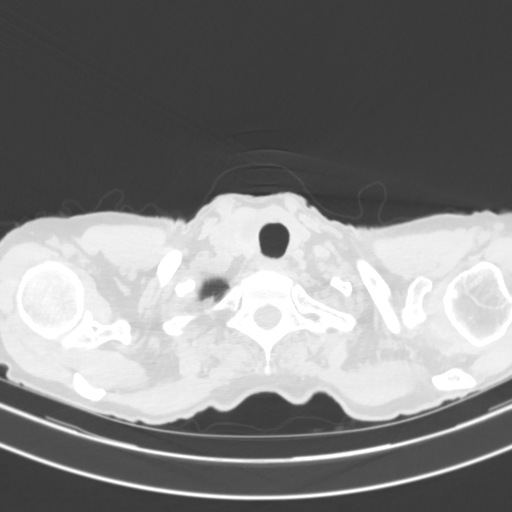

[Series 4: coronal · coronal · 0.61mm/px · 3 of 120 slices shown]
[im 24/120  lung]
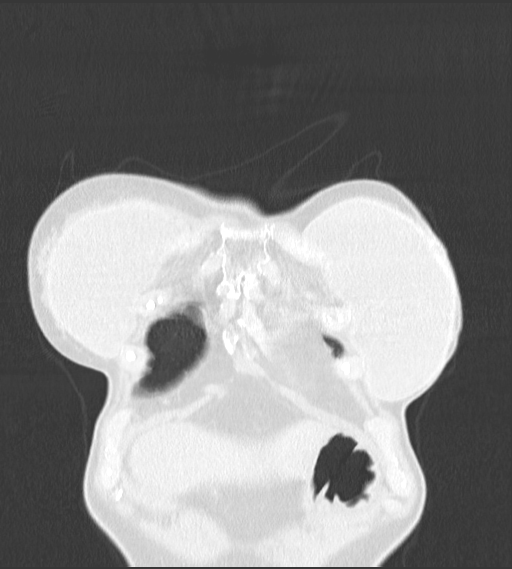
[im 48/120  lung]
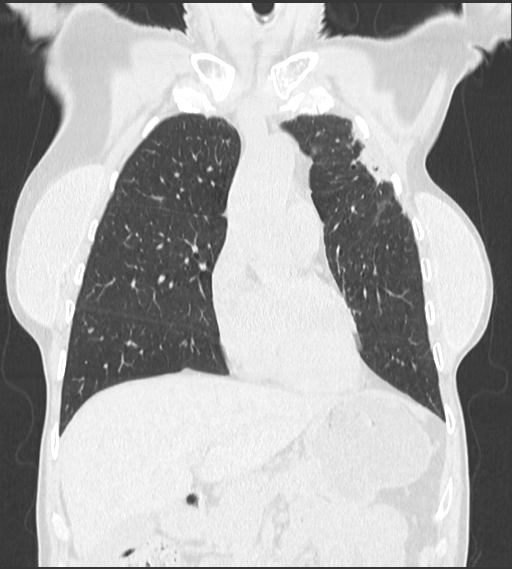
[im 72/120  lung]
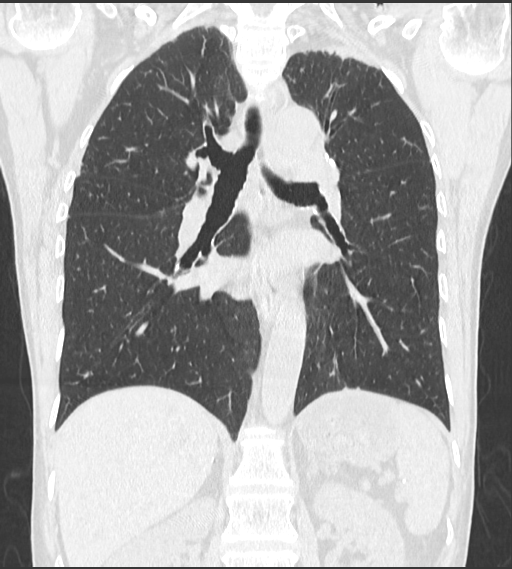

[15 of 36 positions shown; findings below may reference images not displayed]

FINDINGS: LUNGS AND PLEURA:  Peripheral interstitial fibrotic-type prominence within the 
anterior left lung likely represents post radiation induced change and is 
stable. Stable biapical pleural-parenchymal scarring. Stable mild 
reticulonodular infiltrate within the inferior posterior lateral aspect of the 
right upper lobe. Interval resolution of the 5 mm nodule within the left lower 
lobe. No new nodule. Tracheobronchial tree is negative. No effusion. No 
pneumothorax. 
MEDIASTINUM:  Calcified mediastinal and hilar nodes. Normal heart size. No 
pericardial effusion. 
CHEST WALL/AXILLA: Postsurgical changes with bilateral breast implants in place. 
UPPER ABDOMEN: Calcified splenic granulomata. Postsurgical changes of right 
adrenalectomy. 
MUSCULOSKELETAL: No acute abnormality.
IMPRESSION: Interval resolution of the left lower lobe nodule. 
Otherwise stable CT appearance. 
RADIATION DOSE REDUCTION: All CT scans are performed using radiation dose 
reduction techniques, when applicable.  Technical factors are evaluated and 
adjusted to ensure appropriate moderation of exposure.  Automated dose 
management technology is applied to adjust the radiation doses to minimize 
exposure while achieving diagnostic quality images.

## 2022-06-21 ENCOUNTER — Telehealth

## 2022-06-21 MED ORDER — MASTECTOMY BRA
0 refills | Status: AC
Start: 2022-06-21 — End: ?

## 2022-06-21 NOTE — Telephone Encounter (Signed)
Patient called and stated that she needs a order for lymphoma at [facility] and also needs a order for pink ribbon for a bra   CB#[phone]

## 2022-06-22 NOTE — Telephone Encounter (Signed)
[facility] at [place]  [phone]    06/22/22 3:46 PM EDT - Called patient back to let her know that we sent in a prescription to [facility]. Advised that we put in a referral for lymphedema as well. Patient had no questions at this time.

## 2022-08-01 NOTE — Progress Notes (Signed)
Associated Order(s): Large Joint Arthrocentesis: R glenohumeral  Post-Procedure Diagnose(s): Primary osteoarthritis of right shoulder  Formatting of this note is different from the original.    Subjective:   [name] is a 71 y.o. female.  Pain Scale: Pain rating = 8  out of 10     Chief Complaint: Pain of the Right Shoulder    Here for follow-up of shoulder.  Like to try Toradol shot today    Review of Systems   08/01/2022    Immunological: Seasonal Allergies: Positive  Musculoskeletal: Joint Pain: Positive     Objective:   Constitutional:  No acute distress. Well nourished. Well developed.  Eyes:  Sclera are nonicteric.  Respiratory:  No labored breathing.  Cardiovascular:  No marked edema.  Skin:  No marked skin ulcers.  Neurological:  No marked sensory loss noted.  Psychiatric: Alert and oriented x3.  Musculoskeletal   For elevation 160 mild positive Neer Hawkins, 4 5 supra    Radiographs:         No imaging obtained     Assessment:     1. Primary osteoarthritis of right shoulder        Plan:   Will try Toradol shot today see if that works for her bring her back for her appointments as needed from here on out    Patient expresses understanding and is in agreement with the plan.  If she has any problems at all in the meantime, if symptoms worsen or new symptoms develop, she should let us know.    Large Joint Arthrocentesis: R glenohumeral  Performed by: Chiquita Loth, MD  Authorized by: Chiquita Loth, MD      Consent given by: patient  Site marked: site marked  Timeout: Immediately prior to procedure a time out was called to verify the correct patient, procedure, equipment, support staff and site/side marked as required   Supporting Documentation  Indications: pain   Procedure Details  Location:  Shoulder R glenohumeral   Preparation: ethylchloride used.  Needle size: 22 G    Approach: posterior  Patient tolerance: patient tolerated the procedure well with no immediate  complications    Medications administered: 9 mL lidocaine 1 %; 1 mL ketorolac 60 MG/2ML    Patient Education: Cortisone Flare Risk Discussed    Orders Placed This Encounter    lidocaine (LIDODERM) 5 % patch     No follow-ups on file.     Patrcia Dolly, MD   Electronically signed by Chiquita Loth, MD at 08/05/2022  3:13 PM EDT

## 2022-08-01 NOTE — Telephone Encounter (Signed)
Formatting of this note might be different from the original.  Can consider PT prior to procedure. May be able to avoid injection if PT works  Clinical research associate signed by Tito Dine, DO at 08/01/2022  2:14 PM EDT

## 2022-08-07 ENCOUNTER — Encounter

## 2022-08-23 NOTE — Telephone Encounter (Signed)
Formatting of this note might be different from the original.  I put in an order.  Electronically signed by Tito Dine, DO at 08/23/2022  3:05 PM EST

## 2022-08-29 ENCOUNTER — Inpatient Hospital Stay: Admit: 2022-08-29 | Payer: MEDICARE

## 2022-08-29 DIAGNOSIS — I972 Postmastectomy lymphedema syndrome: Secondary | ICD-10-CM

## 2022-08-29 NOTE — Other (Incomplete)
[facility]  a part of [facility]   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   8-22 min = 1 unit; 23-37 min = 2 units; 38-52 min = 3 units;  53-67 min = 4 units; 68-82 min = 5 units         SUBJECTIVE  Pain Level (0-10 scale): 7 out of 10 numeric scale currently posterior arm, 10/10 numeric scale posterior left upper arm to elbow, aching pain, heaviness and fullness  [x] constant [] intermittent [] improving [] worsening [] no change since onset    Any medication changes, allergies to medications, adverse drug reactions, diagnosis change, or new procedure performed?: [x]  No    []  Yes (see summary sheet for update)  Medications: Verified on Patient Summary List    Subjective functional status/changes:     My swelling has gotten worse recently.  It flairs up. I am seeing PT for my neck.  Start of Care: 08/29/2022  Onset Date: 11/28/2018   Current symptoms/Complaints: swelling in left arm and chest region, pain and tightness in left arm, chest and armpit  Mechanism of Injury:     Patient presents with chronic left upper extremity lymphedema s/p  left breast mastectomy with history of radiation and breast reconstruction in 2013 and 2014.  Patient has a history of recurrent cellulitis infections with last infection reported by patient to be in 2021.  Patient has been hospitalized twice for cellulitis.    She wears compression daily and has day and night compression products.  She also has a Nature conservation officer that she is using once a week.  Patient is concerned about recurrent infections and trying to prevent them in the future.  Patient also reports significant chest and shoulder tightness with a history of axillary web syndrome.  Her decreased range of motion results in her having to perform self care activities in a modified manner due to not being able to easily reach her  back or the back of her head.  Has returned to  reside in [place].  Patient is requesting to replace her day and night compression garments currently.  PLOF: : Independent gait without any assistive device for community distances, modified independent to with self care and functional activities due to increased arm size and swelling, activities require increased, unable to reach overhead, clothing fit is an issue  Limitations to PLOF/Activity or Recreational Limitations: unable to play catch with son, can't play tennis with son  Work Hx:  Not currently employed  Living Situation: Patient is married and has moved back to [place].     Has an adopted 65 year old son named Emily [name].  Husband is a retired Public affairs consultant: Independent gait without any assistive device for community distances  Self Care: modified independent with self care and functional activities with increased time and effort  Previous Treatment/Compliance: wears compression garments daily, Flexitouch  every other day  PMHx/Surgical Hx/Comorbidites:   Past Medical History:   Diagnosis Date    Anxiety     Arthritis     Basal cell carcinoma     Bronchiectasis (HCC) 08/2020    Cancer (HCC)     left breast  cancer    Chronic tension  headaches     Concussion 2011    Headache(784.0)     OCCAS    Hx of radiation therapy     COMPLETED IN 2014    Ill-defined condition     cellulitis L UE    Lymphedema of arm     left    Nausea & vomiting     Other and unspecified symptoms and signs involving general sensations and perceptions     HIVES RECENTLY- WAS IN ER Feb 19, 2013    Other ill-defined conditions(799.89)     PHEOCHROMOCYTOMA    S/P chemotherapy, time since greater than 12 weeks     COMPLETED CHEMOTHERAPY IN JUNE 2014    SCCA (squamous cell carcinoma) of skin       Past Surgical History:   Procedure Laterality Date    APPENDECTOMY      BREAST RECONSTRUCTION  11/28/2012    BREAST RECONSTRUCTION /C  INSERTION EXPANDER & ALLODERM performed by Ivar Bury, MD at [facility] AMBULATORY OR    BREAST RECONSTRUCTION Bilateral 09/16/2013    REMOVAL AND REPLACEMENT LEFT BREAST IMPLANT TISSUE EXPANDERS WITH SUBTOTAL CAPSULECTOMY, RIGHT BREAST IMPLANT REMOVAL AND REPLACEMENT FOR SYMMETRY/FAT GRAFTING TO BILATERAL BREASTS performed by Ivar Bury, MD at [facility] MAIN OR    BREAST SURGERY  '91    breast implants; replaced bilat implants 2011    BREAST SURGERY Left 04/22/2014    REMOVAL OF LEFT BREAST IMPLANT, WASHOUT AND CLOSURE OF LEFT BREAST WOUND performed by Ivar Bury, MD at [facility] MAIN OR    GYN      c-section    HEENT      right adrenal removed    HEENT      T&A    MASTECTOMY Left     MASTECTOMY, MODIFIED RADICAL  11/28/2012    LEFT BREAST MODIFIED RADICAL MASTECTOMY WITH AXILLARY DISSECTION W/ RECONSTRUCTION LEFT BREAST WITH TISSUE EXPANDER AND ALLODERM;PORTACATH INSERTION performed by Binnie Rail, MD at [facility] AMBULATORY OR    ORTHOPEDIC SURGERY      bilateral shoulder arthroscopy    OVARY REMOVAL      right    PR UNLISTED PROCEDURE ABDOMEN PERITONEUM & OMENTUM      right  adenal removed    PR UNLISTED PROCEDURE ABDOMEN PERITONEUM & OMENTUM      hernia mesh repair    VASCULAR SURGERY      PORT, THEN REMOVED      Prior Hospitalization:  Hospitalized in  March of 2023 for 5 days for cellulitis  Barriers: [] pain [] Financial [] time [] transportation [x] Other: none noted  Substance use: [] Alcohol [] Tobacco [x] other: none noted  Pt Goals: less swelling, better circulation and mobility  Motivation: Patient is motivated to continue to manage swelling  Cognition: A & O x 4          Objective/Functional Outcome Measure: FOTO scores 69 Lymphedema Life Impact Scale: scores 51 out of 68 with 75% impairment       OBJECTIVE    Current or Previous Compression Garment(s):  Juxo expert silver class 1 custom flat knit compression sleeve with silicone band, hand pieces including gloves and gauntlets, Solaris Tribute custom arm garment ordered in 2022,  Jobst Relax night compression garment                       Compression Pump:  Flexitouch home vasopneumatic device    Skin Integrity and Tissue Assessment  Dermal Status: Intact and Scars  Texture/Consistency:  Boggy  forearm   Sensation:  intact to light touch  Pigmentation/Color Change: Normal  Circulatory: Vascular studies ruled out DVT in past   Nails: Normal  Stemmers Sign: negative    Height: 5'6"  Weight: 122.6lbs  BMI:  (36 or greater: adversely affecting lymphedema)    AROM: History of bilateral rotator cuff surgery  L Shoulder (degrees) R Shoulder (degrees)   Flexion:  0-90 Flexion: 0-120   Extension: 0-25 Extension: 0-30   Internal rotation: 0-40 Internal rotation: 0-70   External rotation: 0-50 External rotation: 0-75   Abduction: 0-75 pain down the arm Abduction: 0-110       Volumetric Measurements:   Right:  1,630.69mL (increased by 23.84 ml since last assessment 08/23/2021 ) Left:  2,007.89mL (increased by 210.37 ml since last assessment 08/23/2021 )   % Difference:  23.17% left larger than right versus 11.90% left larger right on last assessment 08/23/2021 Dominance: right     Volumetric program file number: [id number]    Palpation:  Axillary web cording remains present in the left axilla and medial posterior aspect of left  upper arm    33 min [x] Eval - untimed                        Therapeutic Procedures:  Tx Min Billable or 1:1 Min (if diff from Tx Min) Procedure, Rationale, Specifics   15 15 97535 Self Care/Home Management (timed):  improve patient knowledge and understanding of pain reducing techniques, positioning, physical therapy expectations, procedures and progression, and skin care  to improve patient's ability to progress to PLOF and address remaining functional goals.  (see flow sheet as applicable)    Details if applicable:      Skin/wound care/debridement:   Reviewed skin care principles:    Low pH lotion  Application following MLD principles  Signs and symptoms of cellulitis  Prevention of cellulitis  How to care for skin injuries         Compression garment donning and doffing:            Education: {OP lymphedema compression education 2:59916}    Patient/family demonstrated donning and doffing with        35  35 97140 Manual Therapy (timed):  decrease pain, increase ROM, and decrease edema to improve patient's ability to progress to PLOF and address remaining functional goals.  The manual therapy interventions were performed at a separate and distinct time from the therapeutic activities interventions . (see flow sheet as applicable)    Details if applicable:    Manual Lymphatic Drainage (MLD):  Area to decongest: UE: Left   Sequence used and effectiveness: Modifications were made to manual lymph drainage sequence to exclude cervical techniques secondary to patient's age. and Secondary sequence for upper extremity with trunk involvement.          83 50    Total Total       Patient Education: [x]  Review HEP    [x]   Patient Education billed concurrently with other procedures   []  MLD Patient Education   []  Progressed/Changed HEP based on:   []  positioning   []  Kinesiotape   [x]  Skin care   []  wound care   [x]  other: compression garment ordering process, recommended fitting, upcoming insurance changes  []    Patient /  caregiver re-demonstrated bandaging. []  Yes  []  No  Compression bandaging/garment precautions reviewed: []  Yes  []  No      Pain Level at end of session (0-10 scale): 7 out of 10 numeric scale, "my arm feels better and it feels like things are moving"       Plan of Care / Statement of Necessity for Lymphedema Therapy Services     Assessment / key information:     JAYCEE LUAN is a 71 y.o. female who presents with  left upper extremity stage 2 lymphedema secondary to a history of left breast cancer with mastectomy and reconstruction.  Patient has a history of recurrent cellulitis infections with the last infection having been in 2021.  Patient also presents with axillary web cording on the left side and pain is reported in the left upper quadrant.  Swelling is increased today.  Patient travels between [place] and [place] which has not allowed for consistent treatment in the clinic of the axillary web syndrome.  She would benefit from soft tissue mobilizations to improve her symptoms. Her current compression garments are due to be replaced.   Patient is motivated to improve her condition.  Patient continues to benefit from complete decongestive therapy (CDT) including: Manual lymphatic drainage (MLD) technique, Short-stretch textile bandages/compression system to decongest limb and Kinesiotaping as appropriate.           Evaluation Complexity:  History:  MEDIUM  Complexity : 1-2 comorbidities / personal factors will impact the outcome/ POC ; Examination:  MEDIUM Complexity : 3 Standardized tests and measures addressin body structure, function, activity limitation and / or participation in recreation  ;Presentation:  MEDIUM Complexity : Evolving with changing characteristics  ;Clinical Decision Making:  Other outcome measures LLIS scores 75% impairment  HIGH  Overall Complexity Rating: MEDIUM  Problem List: pain affecting function, decrease ROM, and edema affection function   Treatment Plan may include any  combination of the following: 56387 Therapeutic Exercise, 97140 Manual Therapy, 97535 Self Care/Home Management, 97016 Vasopneumatic Device  (Vasopnuematic compression justification:  Per bilateral girth measures taken and listed above the edema is considered significant and having an impact on the patient's self care and Other: lymphedema), and 97760 Orthotic Management and Training  Patient / Family readiness to learn indicated by: asking questions, interest, and return verbalization   Persons(s) to be included in education: patient (P)  Barriers  to Learning/Limitations: financial and other insurance does not currently cover the cost of compression products,    Measures taken if barriers to learning present: not applicable  Patient Self Reported Health Status: good  Rehabilitation Potential: good     Short Term Goals: To be accomplished in 4  treatments    1. Patient will be measured for and obtain comfortable and optimal fitting compression products to prevent reaccumulation of fluid at discharge which could impair patient's ability to perform safe mobility and dressing.  2.Patient to perform 5/5 lymphedema remedial exercises in session with modified independence utilizing HEP handout, in order to promote optimal independence with management of  condition, as well as promote optimal limb volume reduction required for proper fit of donned clothing.  3.  Patient will demonstrate an increase in left shoulder flexion range of motion from degrees to  degrees for ease of performing ADL's, including self care and upper body dressing.    Long Term Goals: To be accomplished in 6  treatments  Patient will demonstrate decreased volumetric measurements from *** ml of *** extremity to *** ml, in order to reduce risk for infection, decrease feeling of limb heaviness, and increase independence/tolerance for*** (list out specific function) activity completion 6 weeks.  Patient/caregiver will demonstrate improved edema management  as evidenced by performing donning/doffing of garments modified independent 3/3 times within session to aid in reducing risk for infection and promote transition to maintenance phase of CDT.   Patient will demonstrate an increase in left shoulder flexion range of motion from degrees to  degrees for ease of performing ADL's, including self care and upper body dressing.    Frequency / Duration: Patient to be seen *** times per week for *** treatments.    Patient/ Caregiver education and instruction: Diagnosis, prognosis, self care and other compression garments  [x]   Plan of care has been reviewed with PTA      Certification Period: 08/29/2022 to 11/24/2022       Jodene Nam, MSPT, CLT-LANA      08/29/2022       4:15 PM        ===================================================================  I certify that the above Therapy Services are being furnished while the patient is under my care. I agree with the treatment plan and certify that this therapy is necessary.    Physician's Signature:_________________________   DATE:_________   TIME:________                           Ulyses Amor, APRN - *    ** Signature, Date and Time must be completed for valid certification **  Please sign and fax to [phone].  Thank you

## 2022-08-29 NOTE — Other (Cosign Needed)
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.0 Hereditary/ Congenital Lymphedema   []    C50.211 Malignant neoplasm of breast, Right []    G89.3  Cancer associated pain   []    C50.212 Malignant neoplasm of breast, Left []    L03.115 LE Cellulitis, Right   []     []    L03.116 LE Cellulitis, Left                                   Emily [name]    01/07/1951  Page 2 of 2    Physician Order for DME for Diagnosis of left UE lymphedema as Listed on Statement of Medical Necessity, Page 1         Recommended Product:  Units Upper Extremity Rt Lt Units Lower Extremity Rt Lt    Circ-Aid, Ready Wrap, Sigvaris Arm    Inelastic binders (Circ-Aid, Farrow)  [] Foot   [] Below Knee   [] Knee   [] Thigh      Circ-Aid Ready Wrap, Sigvaris hand    Jovi Pak, night use [] Full Leg  [] Lower Leg     1 Tribute Arm, night use  x  Tribute, night use  [] Full Leg  [] Lower Leg      Jovi Pak Arm, night use    Reid Sleeve Leg/ Foot, night use     6 Gradiant Compression Sleeves  [x] Custom []  RM Arm:  [x] CCL1 [] CCL2 [] CCL3  [x] Custom []  RM:  [x] Glove  [x] Gauntlet:                         [x] CCL1 [] CCL2 [] CCL3    x  Gradient Compression Stockings   [] Custom  [] RM Lower Extremity:   [] CCL1       [] CCL2         [] CCL3   [] Knee       [] Thigh        [] Waist Length      Reid sleeve arm w/ hand, night use    Tribute Wrap, night use      Compression Bra          Compression Vest         The above patient was referred for treatment of Lymphedema due to the diagnosis  indicated above.  Lymphedema is a progressive and chronic condition.  It may cause acute infections, muscle atrophy, discomfort, and connective tissue fibrosis with irreversible tissue damage, decreased ROM in the affected limb and diminished functional mobility possibly interfering with independence and ability to work.  Recurrent infections and wound complications that commonly occur with Lymphedema often require hospitalization and extensive wound care, thus increasing medical costs.    The patient has received  complete decongestive therapy which includes manual lymphatic drainage, lymphedema specific exercises, compression bandaging, and hygiene/skin care. Goals of therapy are to reduce the edema and prevent re-accumulation of fluid with its complications.  It is medically necessary for this patient to have daytime garments to control this condition. They will need 2 sets (one set to wear and one set to wash for adequate skin care and wearing time).  Garments must be replaced every 4-6 months for effectiveness.  There are no substitutes available that offer acceptable compression treatment for this Lymphedema patient.    If further information is requested, please contact our certified lymphedema therapists at [phone].    [x]  Jodene Nam, PT, MSPT, CLT-LANA          []   Evans Lance, PT, CLT           []  Zannie Cove, PTA, CLT, CSIS    Printed  Provider Name Ulyses Amor, APRN - NP Provider Signature  Date    Provider NPI

## 2022-09-05 NOTE — Progress Notes (Signed)
Associated Order(s): Other Ortho Injection  Post-Procedure Diagnose(s): Left shoulder pain, unspecified chronicity  Formatting of this note is different from the original.    Subjective:   [name] is a 71 y.o. female.  Pain Scale: Pain rating = 8  out of 10     Chief Complaint: Pain of the Left Shoulder    Here for follow-up of her shoulder she is having some left-sided shoulder pain today.  Got a sticking point or not in the trapezius the left side she is been bothering since Thanksgiving.  He is got severe pain to palpation there no radiating symptoms, hurts when he moves her head and neck a certain way and she is here for evaluation of that says the pain is 8 out 10 nonradiating    Review of Systems   09/05/2022    Immunological: Seasonal Allergies: Positive  Musculoskeletal: Joint Pain: Positive     Objective:   Constitutional:  No acute distress. Well nourished. Well developed.  Eyes:  Sclera are nonicteric.  Respiratory:  No labored breathing.  Cardiovascular:  No marked edema.  Skin:  No marked skin ulcers.  Neurological:  No marked sensory loss noted.  Psychiatric: Alert and oriented x3.  Musculoskeletal   She is got decreased range of motion the neck severe tenderness over the midportion the trapezius I no pain over the biceps tendon or AC joint on that side for elevation 1855 supra 5 external rotators full range of motion the elbow wrist hand neurovascularly intact    Radiographs:     Order: XR SHOULDER 2+ VW LEFT - Indication: Left shoulder pain,   unspecified chronicity     X-ray shoulder left 2+ views ([id number])    Result Date: 09/05/2022  GH AP, Outlet, Axillary.     No major glenohumeral abnormalities well-preserved joint space she looks like she has no major AC joint arthrosis, type 2 acromion      Assessment:     1. Left shoulder pain, unspecified chronicity        Plan:   She is got tenderness palpation over the trapezius, this might be a trigger point will try an injection in that  area hopefully she gets some relief from that.  I also recommend physical therapy to see if that helps    Patient expresses understanding and is in agreement with the plan.  If she has any problems at all in the meantime, if symptoms worsen or new symptoms develop, she should let us know.    Trigger Point 1-2 Muscle Groups      Performed by: Chiquita Loth, MD  Authorized by: Chiquita Loth, MD    Procedure Details:  Procedure: Trigger Point 1-2 Muscle Groups  Consent Given by:  Patient    Medication Details:     Medications administered: 1 mL Triamcinolone 40 MG/ML; 1 mL lidocaine 1 %    Orders Placed This Encounter    X-ray shoulder left 2+ views ([id number])    Ambulatory referral to Physical Therapy    BP Patient Education     No follow-ups on file.     Patrcia Dolly, MD   Electronically signed by Chiquita Loth, MD at 09/20/2022  6:02 PM EST

## 2022-09-05 NOTE — Progress Notes (Signed)
Associated Order(s): Other Ortho Injection  Post-Procedure Diagnose(s): Left shoulder pain, unspecified chronicity  Formatting of this note is different from the original.    Subjective:   [name] is a 71 y.o. female.  Pain Scale: Pain rating = 8  out of 10     Chief Complaint: Pain of the Left Shoulder    Here for follow-up of her shoulder she is having some left-sided shoulder pain today.  Got a sticking point or not in the trapezius the left side she is been bothering since Thanksgiving.  He is got severe pain to palpation there no radiating symptoms, hurts when he moves her head and neck a certain way and she is here for evaluation of that says the pain is 8 out 10 nonradiating    Review of Systems   09/05/2022    Immunological: Seasonal Allergies: Positive  Musculoskeletal: Joint Pain: Positive     Objective:   Constitutional:  No acute distress. Well nourished. Well developed.  Eyes:  Sclera are nonicteric.  Respiratory:  No labored breathing.  Cardiovascular:  No marked edema.  Skin:  No marked skin ulcers.  Neurological:  No marked sensory loss noted.  Psychiatric: Alert and oriented x3.  Musculoskeletal   She is got decreased range of motion the neck severe tenderness over the midportion the trapezius I no pain over the biceps tendon or AC joint on that side for elevation 1855 supra 5 external rotators full range of motion the elbow wrist hand neurovascularly intact    Radiographs:     Order: XR SHOULDER 2+ VW LEFT - Indication: Left shoulder pain,   unspecified chronicity     X-ray shoulder left 2+ views ([id number])    Result Date: 09/05/2022  GH AP, Outlet, Axillary.     No major glenohumeral abnormalities well-preserved joint space she looks like she has no major AC joint arthrosis, type 2 acromion      Assessment:     1. Left shoulder pain, unspecified chronicity        Plan:   She is got tenderness palpation over the trapezius, this might be a trigger point will try an injection in that  area hopefully she gets some relief from that.  I also recommend physical therapy to see if that helps    Patient expresses understanding and is in agreement with the plan.  If she has any problems at all in the meantime, if symptoms worsen or new symptoms develop, she should let us know.    Trigger Point 1-2 Muscle Groups      Performed by: Chiquita Loth, MD  Authorized by: Chiquita Loth, MD    Procedure Details:  Procedure: Trigger Point 1-2 Muscle Groups  Consent Given by:  Patient    Medication Details:     Medications administered: 1 mL Triamcinolone 40 MG/ML; 1 mL lidocaine 1 %    The trigger point was in the area of the rhomboids and trapezius  Orders Placed This Encounter    X-ray shoulder left 2+ views ([id number])    Ambulatory referral to Physical Therapy    BP Patient Education     No follow-ups on file.     Patrcia Dolly, MD   Electronically signed by Chiquita Loth, MD at 10/12/2022  3:57 PM EST

## 2022-09-08 ENCOUNTER — Inpatient Hospital Stay: Admit: 2022-09-08 | Payer: MEDICARE

## 2022-09-08 DIAGNOSIS — I972 Postmastectomy lymphedema syndrome: Secondary | ICD-10-CM

## 2022-09-08 NOTE — Progress Notes (Signed)
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   8-22 min = 1 unit; 23-37 min = 2 units; 38-52 min = 3 units;  53-67 min = 4 units; 68-82 min = 5 units     SUBJECTIVE    Pain Level (0-10 scale): 5/10 axilla/L UE/L upper quadrant.     Any medication changes, allergies to medications, adverse drug reactions, diagnosis change, or new procedure performed?: '[x]'$  No    '[]'$  Yes (see summary sheet for update)   Medications: Verified on Patient Summary List    Subjective functional status/changes:     Patient continues to have outpatient PT to address her neck. Patient reports that she had a Cortisone injection in upper back in trigger point since last visit.  Patient had shoulder xray as well since last visit. Xray results are in imaging section of electronic record.   Patient eager to improve her condition and very engaged in learning during session.    OBJECTIVE  Therapeutic Procedures:  Tx Min Billable or 1:1 Min (if diff from Tx Min) Procedure, Rationale, Specifics   75 75 97140 Manual Therapy (timed):  decrease pain, increase ROM, decrease edema, and increase postural awareness to improve patient's ability to progress to PLOF and address remaining functional goals.  The manual therapy interventions were performed at a separate and distinct  time from the therapeutic activities interventions . 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 Instructed in use of dycem for gentle cord mobilization.       15 15 [id number] Self Care/Home Management (timed):  improve patient knowledge and understanding of pain reducing techniques, positioning, posture/ergonomics, and physical therapy expectations, procedures and progression  to improve patient's ability to progress to PLOF and address remaining functional goals.  (see flow sheet as applicable)    Details if applicable:      Skin/wound care/debridement: Reviewed skin care principles:   Low pH lotion  Application following MLD principles  Signs and symptoms of cellulitis  Prevention of cellulitis  How to care for skin injuries     Compression garment donning and doffing:  Education: Educated patient in Runner, broadcasting/film/video and doffing., Garment lifespan., and use of Arion donning aid to assist with donning.  Educated in improving placement of arm sleeve for improve fit and comfort.     Patient/family demonstrated donning and doffing with    Posture      Educated on the importance of improving posture to improve symptoms. Patient not aware of her posture abnormalities and was able to understand importance of being aware posture in all positions.                    90 90    Total Total       Patient Education: '[x]'$  Review HEP    '[x]'$   Patient Education billed concurrently with other procedures   '[x]'$  MLD Patient Education Continued education in self MLD technique with bathing  and skin care, Educated patient in soft tissue mobilization for axillary web, and Dycem provided.  '[]'$  Progressed/Changed HEP based on:   '[x]'$  positioning   '[]'$  Kinesiotape   '[x]'$  Skin care   '[]'$  wound care   '[]'$  other:   '[x]'$  Compression bandaging/garment precautions reviewed: '[x]'$  Yes  '[]'$  No    Other Objective/Functional Measures  Will reassess volumes and truncal girths on an upcoming visit.     Pain Level at end of session (0-10 scale): 3/10 less tightness in axilla/ L UE.     Assessment   Patient returns today for follow up for the first time since evaluation. Patient reports that she is continuing to have pain and increased swelling in L upper quadrant/ L UE. Patient responded well to MLD and gentle axillary web mobilization with reduction in pain and symptoms. Patient with restricted range of motion in L UE and is limited to 90 degrees with flexion/abduction today. After treatment reported feeling less heaviness/tightness in L axilla/L UE/L upper quadrant. Patient currently also in outpatient PT for her neck so will return for follow ups as able.   Patient will continue to benefit from skilled PT / OT services to address ROM deficits, address swelling, analyze compression product fit and use, assess and modify postural abnormalities, instruct in home lymphedema management program, measure for compression products, and modify and progress therapeutic interventions to address functional deficits and attain remaining goals.    Progress toward goals / Updated goals:  '[]'$   See Progress Note/Recertification    Short Term Goals: To be accomplished in 8  treatments    1. Patient will be measured for and obtain comfortable and optimal fitting compression products to prevent reaccumulation of fluid at discharge which could impair patient's ability to perform safe mobility and dressing. In progress  2.Patient to perform 5/5 lymphedema remedial exercises and axillary web exercises in session with modified independence utilizing  HEP handout, in order to promote optimal independence with management of  condition, as well as promote optimal limb volume reduction required for proper fit of donned clothing.In progress  3.  Patient will demonstrate an increase in left shoulder flexion range of motion from 0-90 degrees to 0-120 degrees for ease of performing ADL's, including self care and upper body dressing.In progress  4. Patient will demonstrate decreased volumetric measurements from 2,007.90 ml of left extremity to 1,9007 ml, in order to reduce risk for infection, decrease feeling of limb heaviness, and increase independence/tolerance for upper body dressing.In progress      Long Term Goals: To be accomplished in 15  treatments  Patient will demonstrate decreased volumetric measurements from 2,007.90  ml of left extremity to 1830 ml, in order to reduce risk for infection, decrease feeling of limb heaviness, and increase independence/tolerance for bathing activities.In progress  Patient/caregiver will demonstrate improved edema management as evidenced by performing donning/doffing of garments modified independent 3/3 times within session to aid in reducing risk for infection and promote transition to maintenance phase of CDT. In progress  Patient will demonstrate an increase in left shoulder flexion range of motion from 120 degrees to 140 degrees for ease of performing ADL's, including self care and upper body dressing.In progress    PLAN  Yes  Continue plan of care  Re-Cert Due: 1/61/0960  '[]'$   Upgrade activities as tolerated  '[]'$   Discharge due to :  '[]'$   Other:    Marquie Aderhold C Jahree Dermody, PTA, CLT       09/08/2022       8:12 PM

## 2022-09-14 NOTE — Unmapped (Signed)
Formatting of this note might be different from the original.  Visit Number: 5    Subjective     Referring MD:  Dr. Brayton El  MRN: [id number]  Medical Dx: cervical spondylosis  Certification period: 08/17/22-11/15/22    Chief Complaint: neck pain    The patient states that she is having less discomfort into the shoulder and hand since starting therapy.     Objective     Increased muscle tone in the right cervical paraspinals that improved with manual therapy.     Treatments    Therapeutic Exercises (97110) to develop strength, endurance, range of motion, flexibility  -UT stretch 3x30"     -LS stretch 3x30"     -chin retraction x20     -Direct time: 15 min     -Indirect time: 15 min       Manual Therapy (97140) for joint mobilization/manipulation, soft tissue mobilization  -STM cervical spine and upper trap     -Direct time: 15 min       Mechanical Traction (34742) Used to produce distraction to relieve pain and increase tissue flexibility.    -15 min 18# of pull intermittent      Comments:  Manual therapy was performed prior to mechanical traction to improve tissue extensibility, increasing the effectiveness of the modality    The therapy interventions in this note were performed separately and at distinct intervals of time.    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 The patient tolerated treatment well today with reports of no pain or discomfort at the conclusion of her session. The patient will benefit from continued therapy to further improve cervical ROM and decrease pain levels.     Plan     Continue with mechanical traction and posture strengthening exercises.       Electronically signed by Tilman Neat, PT, DPT at 09/14/2022 11:51 AM EST    Associated attestation - Tilman Neat, PT, DPT - 09/14/2022 11:51 AM EST  Formatting of this note might be different from the original.  I, Tilman Neat,  PT, DPT,  personally supervised the services described in this document.

## 2022-09-20 ENCOUNTER — Inpatient Hospital Stay: Admit: 2022-09-20 | Payer: MEDICARE

## 2022-09-20 NOTE — Progress Notes (Incomplete)
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   8-22 min = 1 unit; 23-37 min = 2 units; 38-52 min = 3 units;  53-67 min = 4 units; 68-82 min = 5 units     SUBJECTIVE    Pain Level (0-10 scale): upper arm, neck,shoulder 7/10    Any medication changes, allergies to medications, adverse drug reactions, diagnosis change, or new procedure performed?: [x]  No    []  Yes (see summary sheet for update) Ultrasound January 10th of left breast with Dr. Maxwell Marion is scheduled.    Medications: Verified on Patient Summary List    Subjective functional status/changes:     Patient continues to have outpatient PT to address her neck/shoulder pain. Will start dry needling this Thursday.   Patient reports that she has a place on left breast that she concerned about as she feels it is draining. No warmth or increased redness present.   Patient has ultrasound scheduled for 10/12/2022.      OBJECTIVE  Therapeutic Procedures:  Tx Min Billable or 1:1 Min (if diff from Tx Min) Procedure, Rationale, Specifics   65 65 97140 Manual Therapy (timed):  decrease pain, increase ROM, decrease edema, and increase postural awareness to improve patient's ability to progress to PLOF and address remaining functional goals.  The manual therapy interventions were performed at a  separate and distinct time from the therapeutic activities interventions . 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 Instructed in use of dycem for gentle cord mobilization.  Patient        15 15 97535 Self Care/Home Management (timed):  improve patient knowledge and understanding of pain reducing techniques, positioning, posture/ergonomics, and physical therapy expectations, procedures and progression  to improve patient's ability to progress to PLOF and address remaining functional goals.  (see flow sheet as applicable)    Details if applicable:      Skin/wound care/debridement: Reviewed skin care principles:   Low pH lotion  Application following MLD principles  Signs and symptoms of cellulitis  Prevention of cellulitis  How to care for skin injuries  Patient has a raised area on  left breast that is bothersome and is raised and reports had scab that she may have scratched off. Currently closed but needs monitoring as she has history of infection.      Compression garment donning and doffing:            Education: Educated patient in compression garment donning and doffing., Garment lifespan., and use of Arion donning aid to assist with donning.  Educated in improving placement of arm sleeve for improve fit and comfort.     Patient/family demonstrated donning and doffing with    Posture      Educated on the importance of improving posture to improve symptoms. Patient not aware of her posture abnormalities and was able to understand importance of being aware posture in all positions.                     80 80    Total Total       Patient Education: [x]  Review HEP    [x]   Patient Education billed concurrently with other procedures   [x]  MLD Patient Education Continued education in self MLD technique with bathing and skin care, Educated patient in soft tissue mobilization for axillary web, and Dycem provided.  []  Progressed/Changed HEP based on: [employer] with elbow zone in  [x]  positioning   []  Kinesiotape   [x]  Skin care   []  wound care   [x]  other: Discussed ordering new compression garments for L UE. Interested in getting a Marketing executive with elbow zone and will make [employer] Compression on an upcoming visit.  Patient is currently in [employer] garments.   [x]  Compression bandaging/garment precautions reviewed: [x]  Yes  []  No    Other Objective/Functional Measures  Left Upper 1,959.23 ml today compared to 2,007.90 ml on 08/29/2022.   Right Upper  1,630.24 ml today compared to 1,630.24 ml on 08/29/2022.     Percent difference today is 22.24% as compared to 23.17% on evaluation.     Truncal Girths (cm):    09/20/2022   Axilla 80.5   Chest 86.8   Xiphoid 75   Waist 73   Hips 92.8        Lymphedema Life Impact Scale: Patient scored a 45% which correlates with a 66% impairment today.  Down from score of 51 with a 75% impairment on evaluation 08/29/2022    Pain Level at end of session (0-10 scale): 5/10 neck/ shoulder. Patient having pain at the clavicle and posterior shoulder.     Assessment   Patient returns today with noted improvement in swelling and skin texture of L UE. Patient continues to have limitations in range of motion limited at 90 degrees due to pain/guarding. Patient ###  Patient will continue to benefit from skilled PT / OT services to address ROM deficits, address swelling, analyze compression product fit and use, assess and modify postural abnormalities, instruct in home lymphedema management program, measure for compression products, and modify and progress therapeutic  interventions to address functional deficits and attain remaining goals.    Progress toward goals / Updated goals:  [x]   See Progress Note/Recertification    Short Term Goals: To be accomplished in 8  treatments    1. Patient will be measured for and obtain comfortable and optimal fitting compression products to prevent reaccumulation of fluid at discharge which could impair patient's ability to perform safe mobility and dressing. In progress  2.Patient to perform 5/5 lymphedema remedial exercises and axillary web exercises in session  with modified independence utilizing HEP handout, in order to promote optimal independence with management of  condition, as well as promote optimal limb volume reduction required for proper fit of donned clothing.In progress  3.  Patient will demonstrate an increase in left shoulder flexion range of motion from 0-90 degrees to 0-120 degrees for ease of performing ADL's, including self care and upper body dressing.In progress  4. Patient will demonstrate decreased volumetric measurements from 2,007.90 ml of left extremity to 1,9007 ml, in order to reduce risk for infection, decrease feeling of limb heaviness, and increase independence/tolerance for upper body dressing.In progress      Long Term Goals: To be accomplished in 15  treatments  Patient will demonstrate decreased volumetric measurements from 2,007.90 ml of left extremity to 1830 ml, in order to reduce risk for infection, decrease feeling of limb heaviness, and increase independence/tolerance for bathing activities.In progress  Patient/caregiver will demonstrate improved edema management as evidenced by performing donning/doffing of garments modified independent 3/3 times within session to aid in reducing risk for infection and promote transition to maintenance phase of CDT. In progress  Patient will demonstrate an increase in left shoulder flexion range of motion from 120 degrees to 140 degrees for ease of performing ADL's,  including self care and upper body dressing.In progress    PLAN  Yes  Continue plan of care  Re-Cert Due: 0/45/4098  []   Upgrade activities as tolerated  []   Discharge due to :  []   Other:      Manna Gose C Fatimata Talsma, PTA, CLT       09/20/2022       9:19 AM

## 2022-09-21 ENCOUNTER — Ambulatory Visit: Admit: 2022-09-21 | Discharge: 2022-09-21 | Payer: MEDICARE | Attending: Surgery

## 2022-09-21 DIAGNOSIS — Z9189 Other specified personal risk factors, not elsewhere classified: Secondary | ICD-10-CM

## 2022-09-21 MED ORDER — AMOXICILLIN-POT CLAVULANATE 875-125 MG PO TABS
875-125 MG | ORAL_TABLET | Freq: Two times a day (BID) | ORAL | 1 refills | Status: AC
Start: 2022-09-21 — End: 2022-10-23

## 2022-09-21 NOTE — Progress Notes (Signed)
HISTORY OF PRESENT ILLNESS  Emily [name] is a 71 y.o. female.    HPI    NEW patient consult referred by  Dr. Verl Blalock for LEFT breast discharge and redness. Noticed it 2 weeks ago. Pt mentioned a fall she had that maybe the cause.      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 1/14 nodes positive with a 1 cm met, no ECE. 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     12/25/2012 - 02/26/2013: S/p TC q 3 weeks x 4 (75 mg/m2; 600 mg/m2)  04/02/2013 - 05/10/2013: S/p XRT  05/17/2013 - 07/30/2013: Anastrozole, stopped due to joint pains   08/06/2013 - 09/30/2013: Exemestane stopped due joint pains  07/10/2013: Zometa q 6 months x 3 years, stopped   09/30/2013 - 03/08/2014: Tamoxifen, stopped 03/08/14 due to joint pains  03/23/2014 - 09/18/2014: Letrozole, stopped due to joint pains  10/04/2014 - 12/15/2014: Tamoxifen, stopped due to hot flashes and mood   swings.  12/16/2014 - 01/30/2015: Anastrozole, stopped due to headache and   dizziness  02/09/2015 - 05/2018: Toremefine     Breast imaging-   09/2018 - breast MRI     Past Medical History:   Diagnosis Date    Anxiety     Arthritis     Basal cell carcinoma     Bronchiectasis ([place]) 08/2020    Cancer ([place])     left breast  cancer    Chronic tension headaches     Concussion 2011    Headache(784.0)     OCCAS    Hx of radiation therapy     COMPLETED IN 2014    Ill-defined condition     cellulitis L UE    Lymphedema of arm     left    Nausea & vomiting     Other and unspecified symptoms and signs involving general sensations and perceptions     HIVES RECENTLY- WAS IN ER Feb 19, 2013    Other ill-defined conditions(799.89)     PHEOCHROMOCYTOMA    S/P chemotherapy, time since greater than 12 weeks     COMPLETED CHEMOTHERAPY IN JUNE 2014    SCCA (squamous cell carcinoma) of skin        Past Surgical History:   Procedure Laterality Date    APPENDECTOMY      BREAST RECONSTRUCTION  11/28/2012    BREAST RECONSTRUCTION /C   INSERTION EXPANDER & ALLODERM performed by Sharlotte Alamo, MD at [place] Bilateral 09/16/2013    REMOVAL AND REPLACEMENT LEFT BREAST IMPLANT TISSUE EXPANDERS WITH SUBTOTAL CAPSULECTOMY, RIGHT BREAST IMPLANT REMOVAL AND REPLACEMENT FOR SYMMETRY/FAT GRAFTING TO BILATERAL BREASTS performed by Sharlotte Alamo, MD at [facility]    breast implants; replaced bilat implants 2011    BREAST SURGERY Left 04/22/2014    REMOVAL OF LEFT BREAST IMPLANT, WASHOUT AND CLOSURE OF LEFT BREAST WOUND performed by Sharlotte Alamo, MD at [facility]      c-section    HEENT      right adrenal removed    HEENT      T&A    MASTECTOMY Left     MASTECTOMY, MODIFIED RADICAL  11/28/2012    LEFT BREAST MODIFIED RADICAL MASTECTOMY WITH AXILLARY DISSECTION W/ RECONSTRUCTION LEFT BREAST WITH TISSUE EXPANDER AND ALLODERM;PORTACATH INSERTION performed by Ty Hilts,  MD at [facility]      bilateral shoulder arthroscopy    OVARY REMOVAL      right    PR UNLISTED PROCEDURE ABDOMEN PERITONEUM & OMENTUM      right adenal removed    PR UNLISTED PROCEDURE ABDOMEN PERITONEUM & OMENTUM      hernia mesh repair    VASCULAR SURGERY      PORT, THEN REMOVED       Social History     Socioeconomic History    Marital status: Single     Spouse name: Not on file    Number of children: Not on file    Years of education: Not on file    Highest education level: Not on file   Occupational History    Not on file   Tobacco Use    Smoking status: Never    Smokeless tobacco: Never   Substance and Sexual Activity    Alcohol use: No    Drug use: No    Sexual activity: Not on file   Other Topics Concern    Not on file   Social History Narrative    Not on file     Social Determinants of Health     Financial Resource Strain: Not on file   Food Insecurity: Not on file   Transportation Needs: Not on file   Physical Activity: Not on file   Stress: Not on file   Social Connections: Not on  file   Intimate Partner Violence: Not on file   Housing Stability: Not on file       Current Outpatient Medications on File Prior to Visit   Medication Sig Dispense Refill    DULoxetine (CYMBALTA) 30 MG extended release capsule Take 1 capsule by mouth 2 times daily      fluticasone furoate-vilanterol (BREO ELLIPTA) 100-25 MCG/ACT inhaler ceived the following from [facility]: Outside name: Breo Ellipta 100-25 mcg/dose inhaler      oxyCODONE (ROXICODONE) 5 MG immediate release tablet Take 1 tablet by mouth every 6 hours as needed.      sodium chloride, Inhalant, (HYPER-SAL) 7 % nebulizer solution 1 (ONE) VIAL TWO TIMES DAILY, AFTER ALBUTEROL, WITH AEROBIKA      zolpidem (AMBIEN) 10 MG tablet Take 1 tablet by mouth nightly.      Mastectomy Bra MISC by Does not apply route Mastectomy bras and prosthesis 6 each 0    acetaminophen (TYLENOL) 500 MG tablet Take 500 mg by mouth every 6 hours as needed      albuterol sulfate HFA (PROVENTIL;VENTOLIN;PROAIR) 108 (90 Base) MCG/ACT inhaler INHALE 2 PUFFS BY MOUTH 4 TIMES A DAY AS NEEDED       No current facility-administered medications on file prior to visit.       Allergies   Allergen Reactions    Bacitracin Hives and Swelling    Nsaids Swelling     Throat swells    Aspirin Palpitations and Swelling     Other reaction(s): Unknown (comments)  Gi bleeding  Other reaction(s): Other (see comments), Rapid pulse  Other reaction(s): Unknown (comments)  Gi bleeding    Prochlorperazine Rash       OB History    No obstetric history on file.      Obstetric Comments   Menarche 58, LMP age 53, # of children 2, age of 66st delivery 76, Hysterectomy/oophorectomy no/yes, Breast bx yes/left 2013, history of breast feeding no, BCP  no, Hormone therapy in the past               Review of Systems  All other systems reviewed and are negative.     Physical Exam  Exam conducted with a chaperone present.   Constitutional:       Appearance: She is not diaphoretic.   HENT:      Head:  Normocephalic and atraumatic.      Right Ear: External ear normal.      Left Ear: External ear normal.   Eyes:      General: No scleral icterus.        Right eye: No discharge.         Left eye: No discharge.      Pupils: Pupils are equal, round, and reactive to light.   Cardiovascular:      Rate and Rhythm: Normal rate and regular rhythm.   Pulmonary:      Effort: Pulmonary effort is normal. No respiratory distress.      Breath sounds: Normal breath sounds. No stridor.   Chest:       Abdominal:      General: There is no distension.      Palpations: Abdomen is soft. There is no mass.      Tenderness: There is no abdominal tenderness.   Musculoskeletal:         General: Normal range of motion.      Cervical back: Normal range of motion and neck supple.   Lymphadenopathy:      Cervical: No cervical adenopathy.   Skin:     General: Skin is warm.   Neurological:      Mental Status: She is alert and oriented to person, place, and time.   Psychiatric:         Behavior: Behavior normal.         Thought Content: Thought content normal.         Judgment: Judgment normal.       Imaging & Procedures  BREAST ULTRASOUND  Indication: LEFT breast discharge and redness  Technique: The area was scanned using a high-frequency linear-array near-field transducer  Findings: no mass  Impression: difficult to evaluate implant  Disposition: Order MRI       ASSESSMENT and PLAN   Diagnosis Orders   1. At high risk for breast cancer  MRI BREAST BILATERAL W WO CONTRAST      2. History of left breast cancer  MRI BREAST BILATERAL W WO CONTRAST      3. Breast implant protrusion, initial encounter  MRI BREAST BILATERAL W WO CONTRAST      4. Breast implant status  MRI BREAST BILATERAL W WO CONTRAST        New patient presents for evaluation of LEFT breast discharge and redness, and is doing well overall. Physical examination of the RIGHT breast was normal. Physical exam of the LEFT breast noted a rippled implant with a small red nodule mid  incision. Ultrasound of the LEFT breast did no visualize a mass. Difficult to evaluate implant based on ultrasound. We will order an MRI to further evaluate, and will prescribe her a course of antibiotic.      This plan was reviewed with the patient and patient agrees. All questions were answered.    Total time spent excluding procedural time was 44 minutes.    Written by Conchita Paris, [place], as dictated by Dr. Sandford Craze, MD.

## 2022-09-21 NOTE — Progress Notes (Deleted)
HISTORY OF PRESENT ILLNESS  Emily [name] is a 71 y.o. female     HPI NEW patient consult referred by  Dr. Verl Blalock for LEFT breast discharge and redness. Noticed it 2 weeks ago. Pt mentioned a fall she had that maybe the cause.           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 1/14 nodes positive with a 1 cm met, no ECE. 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       12/25/2012 - 02/26/2013: S/p TC q 3 weeks x 4 (75 mg/m2; 600 mg/m2)       04/02/2013 - 05/10/2013: S/p XRT       05/17/2013 - 07/30/2013: Anastrozole, stopped due to joint pains    08/06/2013 - 09/30/2013: Exemestane stopped due joint pains   07/10/2013: Zometa q 6 months x 3 years, stopped    09/30/2013 - 03/08/2014: Tamoxifen, stopped 03/08/14 due to joint pains   03/23/2014 - 09/18/2014: Letrozole, stopped due to joint pains   10/04/2014 - 12/15/2014: Tamoxifen, stopped due to hot flashes and mood swings.   12/16/2014 - 01/30/2015: Anastrozole, stopped due to headache and dizziness   02/09/2015 - 05/2018: Toremefine       Breast imaging-   09/2018 - breast MRI                        Review of Systems      Physical Exam       ASSESSMENT and PLAN  {Assessment and Plan Chronic Disease:[phone]}

## 2022-09-27 ENCOUNTER — Inpatient Hospital Stay: Admit: 2022-09-27 | Payer: MEDICARE | Attending: Surgery

## 2022-09-27 ENCOUNTER — Ambulatory Visit: Payer: MEDICARE | Attending: Surgery

## 2022-09-27 DIAGNOSIS — Z9189 Other specified personal risk factors, not elsewhere classified: Secondary | ICD-10-CM

## 2022-09-27 LAB — POCT CREATININE AND GFR
POC Creatinine: 0.6 mg/dL (ref 0.6–1.3)
eGFR, POC: >60 mL/min/{1.73_m2} (ref >60)

## 2022-09-27 MED ORDER — GADOBUTROL 1 MMOL/ML IV SOLN
1 MMOL/ML | Freq: Once | INTRAVENOUS | Status: AC | PRN
Start: 2022-09-27 — End: 2022-09-27
  Administered 2022-09-27: 15:00:00 5 mL via INTRAVENOUS

## 2022-09-27 MED FILL — GADAVIST 1 MMOL/ML IV SOLN: 1 MMOL/ML | INTRAVENOUS | Qty: 7.5

## 2022-09-29 ENCOUNTER — Inpatient Hospital Stay: Admit: 2022-09-29 | Payer: MEDICARE

## 2022-09-29 NOTE — Progress Notes (Incomplete)
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 Patient is in need of new compression products, which were measured for today. Patient does continue with range of motion limitations in the L shoulder and has pain/guarding with motion past 90 degrees. Patient has had an ultrasound and MRI of left breast since last visit and will return to plastic surgeon tomorrow to determine next steps. Patient will return to the clinic for follow up in 2 weeks and should have new custom compression arm sleeve at that time for fitting.  Patient will continue to benefit from skilled PT / OT services to address ROM deficits, address swelling, analyze compression product fit and use, assess and modify postural abnormalities, instruct in home lymphedema management program, measure for compression products, and modify and progress therapeutic interventions to address functional deficits and attain remaining goals.    CURRENT STATUS    Short Term Goals: To be accomplished in 8  treatments    1. Patient will be measured for and obtain comfortable and optimal fitting compression products to prevent reaccumulation of fluid at discharge which could impair patient's ability to perform safe mobility and dressing.   Status at last Eval/Progress Note: Initiated  Current Status: Not Met/In Progress  Goal Met?  no  2.Patient to perform 5/5 lymphedema remedial exercises and axillary web exercises in session with modified  independence utilizing HEP handout, in order to promote optimal independence with management of  condition, as well as promote optimal limb volume reduction required for proper fit of donned clothing.  Status at last Eval/Progress Note: Initiated  Current Status: Not Met/In Progress  Goal Met?  no  3.  Patient will demonstrate an increase in left shoulder flexion range of motion from 0-90 degrees to 0-120 degrees for ease of performing ADL's, including self care and upper body dressing.  Status at last Eval/Progress Note: Initiated  Current Status: Not Met/In Progress  Goal Met?  no  4. Patient will demonstrate decreased volumetric measurements from 2,007.90 ml of left extremity to 1,9007 ml, in order to reduce risk for infection, decrease feeling of limb heaviness, and increase independence/tolerance for upper body dressing.  Status at last Eval/Progress Note: Initiated  Current Status: Not Met/In Progress  Goal Met?  No    Long Term Goals: To be accomplished in 15  treatments  Patient will demonstrate decreased volumetric measurements from 2,007.90 ml of left extremity to 1830 ml, in order to reduce risk for infection, decrease feeling of limb heaviness, and increase independence/tolerance for bathing activities.  Status at last Eval/Progress Note: Initiated  Current Status: Not Met/In Progress  Goal Met?  no  Patient/caregiver will demonstrate improved edema management as evidenced by performing donning/doffing of garments modified independent 3/3 times within session to aid in reducing risk for infection and promote transition to maintenance phase of CDT.   Status at last Eval/Progress Note: Initiated  Current Status: Not Met/In Progress  Goal Met?  no  Patient will demonstrate an increase in left shoulder flexion range of motion from 120 degrees to 140 degrees for ease of performing ADL's, including self care and upper body dressing.  Status at last Eval/Progress Note: Initiated  Current Status: Not Met/In  Progress  Goal Met?  no    RECOMMENDATIONS  Continue with current plan of care and adjust treatment as needed.       Yaroslav Gombos C Quinntin Malter, PTA, CLT      09/29/2022       3:40 PM    If you have any questions/comments please contact us directly at [phone].   Thank you for allowing [place] to assist in the care of your patient.

## 2022-09-29 NOTE — Progress Notes (Cosign Needed)
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   8-22 min = 1 unit; 23-37 min = 2 units; 38-52 min = 3 units;  53-67 min = 4 units; 68-82 min = 5 units     SUBJECTIVE    Pain Level (0-10 scale): 5/10 left upper quadrant/ left arm    Any medication changes, allergies to medications, adverse drug reactions, diagnosis change, or new procedure performed?: []  No    [x]  Yes (see summary sheet for update)   Since last visit patient was able to have ultrasound of left breast and MRI. Patient to have follow up tomorrow with Dr. Rosie Fate (plastic surgeon)    Medications: Verified on Patient Summary List. Patient reported she also has started oral antibiotic.     Subjective functional status/changes:     Patient returns today and was able to have ultrasound last week and then MRI on 09/27/2022 to assess L breast. Patient will be going to see plastic surgeon tomorrow for follow up. Patient reports that she will find out if she has to have a biopsy versus potential surgery.   Patient reports that she would like to proceed with ordering a new custom arm sleeve today.    OBJECTIVE  Therapeutic Procedures:  Tx Min Billable or 1:1 Min (if diff from Tx Min) Procedure, Rationale, Specifics   [phone] [id number] Manual Therapy (timed):  decrease pain, increase ROM,  decrease edema, and increase postural awareness to improve patient's ability to progress to PLOF and address remaining functional goals.  The manual therapy interventions were performed at a separate and distinct time from the therapeutic activities interventions . 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   Focused on elbow and forearm as this area continues to present with greatest discomfort.     Skin/wound care/debridement: Reviewed skin care principles:   Skin care products., Hygiene., and Prevention of cellulitis.  The area/pustule on left medial breast continues to be present, but less irritation noted. Patient has assessed by Dr. Alfredia Ferguson last week and will go back to plastic surgeon Dr. Rosie Fate tomorrow.        20 20 [id number] Orthotic Management and Training UE (timed): improve positioning  of upper extremity during self care activities, improve pressure distrubution of the UE to improve patient's ability to progress to PLOF and address remaining functional goals.    Details if applicable:     Upper/Lower Extremity Compression:   Measured for the following compression products:    UE: Left Arm Sleeve    Style: Flat knit    Brand: [employer]    Type: Custom: 5cm top band and functional elbow zone  in [employer]: [employer] Compression    Education: Educated patient in Probation officer., Glove use with donning., Daily wear schedule., Daily laundering., Garment lifespan., Return and reordering process (by bringing prior garments into the clinic)., Educated patient to monitor for redness or pressure  points on the skin., and If new pain occurs they should contact their therapist.     8 65    Total Total       Patient Education: [x]  Review HEP    [x]   Patient Education billed concurrently with other procedures   [x]  MLD Patient Education Continued education in self MLD technique with bathing and skin care, Educated patient in soft tissue mobilization for axillary web, and Dycem provided.  []  Progressed/Changed HEP based on:   [x]  positioning   []  Kinesiotape   [x]  Skin care   []  wound care   [x]  other: Garment ordering process.   [x]  Compression bandaging/garment precautions reviewed: [x]  Yes  []  No    Other Objective/Functional Measures  Left Upper 2,086.32 ml today compared to 1,959.23 ml 09/20/2022 compared to 2,007.90 ml on 08/29/2022.   Right Upper  1,602.73 ml 09/20/2022 compared to 1,630.24 ml on 08/29/2022.     Percent difference today is 30.17% as compared to 23.17% on evaluation.     Pain Level at end of session (0-10 scale): 4/10  left upper quadrant/ left arm     Assessment   Patient continues with L UE swelling (greatest in forearm/elbow) that is relieved with MLD. Patient is in need of new compression products, which were measured for today. Patient does continue with range of motion limitations in the L shoulder and has pain/guarding with motion past 90 degrees. Patient has had ultrasound and MRI of left breast since last visit and will return to plastic surgeon tomorrow to determine next steps. Patient will return to the clinic for follow up in 2 weeks and should have new custom compression arm sleeve at that time for fitting.  Patient will continue to benefit from skilled PT / OT services to address ROM deficits, address swelling, analyze compression product fit and use, assess and modify postural abnormalities, instruct in home lymphedema management program, measure for compression products, and modify and progress therapeutic interventions to address functional deficits and attain remaining  goals.    Progress toward goals / Updated goals:  [x]   See Progress Note/Recertification    Short Term Goals: To be accomplished in 8  treatments    1. Patient will be measured for and obtain comfortable and optimal fitting compression products to prevent reaccumulation of fluid at discharge which could impair patient's ability to perform safe mobility and dressing. In progress  2.Patient to perform 5/5 lymphedema remedial exercises and axillary web exercises in session with modified independence utilizing HEP handout, in order to promote optimal independence with management of  condition, as well as promote optimal limb volume reduction required for proper fit of donned clothing.In progress  3.  Patient will demonstrate an increase in  left shoulder flexion range of motion from 0-90 degrees to 0-120 degrees for ease of performing ADL's, including self care and upper body dressing.In progress  4. Patient will demonstrate decreased volumetric measurements from 2,007.90 ml of left extremity to 1,9007 ml, in order to reduce risk for infection, decrease feeling of limb heaviness, and increase independence/tolerance for upper body dressing.In progress      Long Term Goals: To be accomplished in 15  treatments  Patient will demonstrate decreased volumetric measurements from 2,007.90 ml of left extremity to 1830 ml, in order to reduce risk for infection, decrease feeling of limb heaviness, and increase independence/tolerance for bathing activities.In progress  Patient/caregiver will demonstrate improved edema management as evidenced by performing donning/doffing of garments modified independent 3/3 times within session to aid in reducing risk for infection and promote transition to maintenance phase of CDT. In progress  Patient will demonstrate an increase in left shoulder flexion range of motion from 120 degrees to 140 degrees for ease of performing ADL's, including self care and upper body dressing.In  progress    PLAN  Yes  Continue plan of care  Re-Cert Due: 1/61/0960  []   Upgrade activities as tolerated  []   Discharge due to :  []   Other:      Dechelle Attaway C Vernisha Bacote, PTA, CLT       09/29/2022       2:20 PM

## 2022-09-29 NOTE — Progress Notes (Signed)
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 Patient is in need of new compression products, which were measured for today. Patient does continue with range of motion limitations in the L shoulder and has pain/guarding with motion past 90 degrees. Patient has had ultrasound and MRI of left breast since last visit and will return to plastic surgeon tomorrow to determine next steps. Patient will return to the clinic for follow up in 2 weeks and should have new custom compression arm sleeve at that time for fitting.  Patient will continue to benefit from skilled PT / OT services to address ROM deficits, address swelling, analyze compression product fit and use, assess and modify postural abnormalities, instruct in home lymphedema management program, measure for compression products, and modify and progress therapeutic interventions to address functional deficits and attain remaining goals.     CURRENT STATUS     Short Term Goals: To be accomplished in 8  treatments    1. Patient will be measured for and obtain comfortable and optimal fitting compression products to prevent reaccumulation of fluid at discharge which could impair patient's ability to perform safe mobility and dressing.   Status at last Eval/Progress Note: Initiated  Current Status: Not Met/In Progress  Goal Met?  no  2.Patient to perform 5/5 lymphedema remedial exercises and axillary web exercises in session  with modified independence utilizing HEP handout, in order to promote optimal independence with management of  condition, as well as promote optimal limb volume reduction required for proper fit of donned clothing.  Status at last Eval/Progress Note: Initiated  Current Status: Not Met/In Progress  Goal Met?  no  3.  Patient will demonstrate an increase in left shoulder flexion range of motion from 0-90 degrees to 0-120 degrees for ease of performing ADL's, including self care and upper body dressing.  Status at last Eval/Progress Note: Initiated  Current Status: Not Met/In Progress  Goal Met?  no  4. Patient will demonstrate decreased volumetric measurements from 2,007.90 ml of left extremity to 1,9007 ml, in order to reduce risk for infection, decrease feeling of limb heaviness, and increase independence/tolerance for upper body dressing.  Status at last Eval/Progress Note: Initiated  Current Status: Not Met/In Progress  Goal Met?  No     Long Term Goals: To be accomplished in 15  treatments  Patient will demonstrate decreased volumetric measurements from 2,007.90 ml of left extremity to 1830 ml, in order to reduce risk for infection, decrease feeling of limb heaviness, and increase independence/tolerance for bathing activities.  Status at last Eval/Progress Note: Initiated  Current Status: Not Met/In Progress  Goal Met?  no  Patient/caregiver will demonstrate improved edema management as evidenced by performing donning/doffing of garments modified independent 3/3 times within session to aid in reducing risk for infection and promote transition to maintenance phase of CDT.   Status at  last Eval/Progress Note: Initiated  Current Status: Not Met/In Progress  Goal Met?  no  Patient will demonstrate an increase in left shoulder flexion range of motion from 120 degrees to 140 degrees for ease of performing ADL's, including self care and upper body dressing.  Status at last Eval/Progress Note: Initiated  Current Status:  Not Met/In Progress  Goal Met?  no     RECOMMENDATIONS  Continue with current plan of care and adjust treatment as needed.         Robin C Patch, PTA, CLT      09/29/2022       3:40 PM  Rolan Bucco, MSPT, CLT-LANA      If you have any questions/comments please contact us directly at [phone].   Thank you for allowing [place] to assist in the care of your pa

## 2022-10-07 ENCOUNTER — Ambulatory Visit: Payer: MEDICARE | Attending: Surgery

## 2022-10-11 NOTE — Telephone Encounter (Signed)
Formatting of this note might be different from the original.  ----- Message from Morley Kos, Theodore sent at 10/10/2022  9:11 AM EST -----  Regarding: RE: HA  Durolane is 600.00    ----- Message -----  From: Kathreen Cornfield, MA  Sent: 10/10/2022   8:55 AM EST  To: Morley Kos, ROT  Subject: RE: HA                                           Thank you ma'am, that is all I needed to hear. What is the cash price?   ----- Message -----  From: Morley Kos, ROT  Sent: 10/04/2022   6:19 PM EST  To: Kathreen Cornfield, MA; Randall Hiss  Subject: RE: HA                                           HA is not approved by commercial insurances for use in the shoulder. Patient will have to be self pay     ----- Message -----  From: Kathreen Cornfield, MA  Sent: 09/30/2022   8:55 AM EST  To: Morley Kos, ROT; Randall Hiss  Subject: HA                                               Pt came in 12/28 for her WC HA injx, now she would like to order an HA injx for the opposite shoulder through insurance, she says it may not approve but would like cash option if it does not get approved. Let us know what we do from here      Electronically signed by Kathreen Cornfield, MA at 10/11/2022  7:56 AM EST

## 2022-10-12 ENCOUNTER — Ambulatory Visit: Admit: 2022-10-12 | Discharge: 2022-10-12 | Payer: MEDICARE | Attending: Surgery

## 2022-10-12 ENCOUNTER — Ambulatory Visit: Payer: MEDICARE | Attending: Surgery

## 2022-10-12 DIAGNOSIS — Z853 Personal history of malignant neoplasm of breast: Secondary | ICD-10-CM

## 2022-10-12 NOTE — Progress Notes (Deleted)
HISTORY OF PRESENT ILLNESS  Emily [name] is a 72 y.o. female     HPI ESTABLISHED patient here for  Left breast: 4 mm enhancement in the skin at the 7-8:00 position.     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 1/14 nodes positive with a 1 cm met, no ECE. 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     12/25/2012 - 02/26/2013: S/p TC q 3 weeks x 4 (75 mg/m2; 600 mg/m2)  04/02/2013 - 05/10/2013: S/p XRT  05/17/2013 - 07/30/2013: Anastrozole, stopped due to joint pains   08/06/2013 - 09/30/2013: Exemestane stopped due joint pains  07/10/2013: Zometa q 6 months x 3 years, stopped   09/30/2013 - 03/08/2014: Tamoxifen, stopped 03/08/14 due to joint pains  03/23/2014 - 09/18/2014: Letrozole, stopped due to joint pains  10/04/2014 - 12/15/2014: Tamoxifen, stopped due to hot flashes and mood   swings.  12/16/2014 - 01/30/2015: Anastrozole, stopped due to headache and   dizziness  02/09/2015 - 05/2018: Toremefine      MRI BREAST BILATERAL W WO CONTRAST 09/27/2022    Narrative  EXAM:  MRI BREAST BILATERAL W WO CONTRAST    INDICATION: Left breast carcinoma treated with left mastectomy and bilateral  breast reconstruction in 2014 and 2015. Left breast redness and discharge at the  anterior 8:00 position. Evaluate for recurrence. Evaluate implant integrity.    COMPARISON: MR breast on 11/02/2021, 10/08/2020, and 10/23/2019.    EXAM: MRI of right and left breast without and with IV contrast Gadolinium .    TECHNIQUE: MRI breast without and with IV contrast. Bilateral breast MRI was  performed using a dedicated breast coil without compression with the patient in  the prone position. Precontrast T1-weighted images with fat suppression were  obtained followed by bolus injection of 5 mL of Gadavist . Postcontrast dynamic  and high-resolution images were acquired. Axial inversion recovery, water  suppression, and silicone suppression imaging was also performed. The images  were  analyzed using CAD analysis, enhancement curves, digital subtraction, and 2  and 3 dimensional reconstructions.    FINDINGS:    Implants: Bilateral silicone implants are intact. No extracapsular rupture or  extravasation.    Background parenchymal enhancement: Minimal.    Lymph nodes: No lymphadenopathy.    Right breast: There is no suspicious focus of morphology or temporal  enhancement. Normal skin and nipple        Dr. Verl Blalock       Review of Systems      Physical Exam       ASSESSMENT and PLAN  {Assessment and Plan Chronic Disease:[id number]}

## 2022-10-12 NOTE — Progress Notes (Signed)
HISTORY OF PRESENT ILLNESS  Emily [name] is a 72 y.o. female.    HPI  ESTABLISHED patient here for Left breast: 4 mm enhancement in the skin at the 7-8:00 position.      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 1/14 nodes positive with a 1 cm met, no ECE. 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 pT1b pN1a Mx.     12/25/2012 - 02/26/2013: S/p TC q 3 weeks x 4 (75 mg/m2; 600 mg/m2)  04/02/2013 - 05/10/2013: S/p XRT  05/17/2013 - 07/30/2013: Anastrozole, stopped due to joint pains   08/06/2013 - 09/30/2013: Exemestane stopped due joint pains  07/10/2013: Zometa q 6 months x 3 years, stopped   09/30/2013 - 03/08/2014: Tamoxifen, stopped 03/08/14 due to joint pains  03/23/2014 - 09/18/2014: Letrozole, stopped due to joint pains  10/04/2014 - 12/15/2014: Tamoxifen, stopped due to hot flashes and mood swings.  12/16/2014 - 01/30/2015: Anastrozole, stopped due to headache and dizziness  02/09/2015 - 05/2018: Toremefine  Completed XRT  AI completed, adj. Chemo Laurena Bering)      MRI BREAST BILATERAL W WO CONTRAST 09/27/2022     Narrative  EXAM:  MRI BREAST BILATERAL W WO CONTRAST     INDICATION: Left breast carcinoma treated with left mastectomy and bilateral  breast reconstruction in 2014 and 2015. Left breast redness and discharge at the  anterior 8:00 position. Evaluate for recurrence. Evaluate implant integrity.     COMPARISON: MR breast on 11/02/2021, 10/08/2020, and 10/23/2019.     EXAM: MRI of right and left breast without and with IV contrast Gadolinium .     TECHNIQUE: MRI breast without and with IV contrast. Bilateral breast MRI was  performed using a dedicated breast coil without compression with the patient in  the prone position. Precontrast T1-weighted images with fat suppression were  obtained followed by bolus injection of 5 mL of Gadavist . Postcontrast dynamic  and high-resolution images were acquired. Axial inversion recovery, water  suppression, and silicone  suppression imaging was also performed. The images  were analyzed using CAD analysis, enhancement curves, digital subtraction, and 2  and 3 dimensional reconstructions.     FINDINGS:     Implants: Bilateral silicone implants are intact. No extracapsular rupture or  extravasation.     Background parenchymal enhancement: Minimal.     Lymph nodes: No lymphadenopathy.     Right breast: There is no suspicious focus of morphology or temporal  enhancement. Normal skin and nipple     MRI Result (most recent):  MRI BREAST BILATERAL W WO CONTRAST 09/27/2022     Narrative  EXAM:  MRI BREAST BILATERAL W WO CONTRAST     INDICATION: Left breast carcinoma treated with left mastectomy and bilateral  breast reconstruction in 2014 and 2015. Left breast redness and discharge at the  anterior 8:00 position. Evaluate for recurrence. Evaluate implant integrity.     COMPARISON: MR breast on 11/02/2021, 10/08/2020, and 10/23/2019.     EXAM: MRI of right and left breast without and with IV contrast Gadolinium .     TECHNIQUE: MRI breast without and with IV contrast. Bilateral breast MRI was  performed using a dedicated breast coil without compression with the patient in  the prone position. Precontrast T1-weighted images with fat suppression were  obtained followed by bolus injection of 5 mL of Gadavist . Postcontrast dynamic  and high-resolution images were acquired. Axial inversion recovery, water  suppression, and silicone suppression imaging was also performed.  The images  were analyzed using CAD analysis, enhancement curves, digital subtraction, and 2  and 3 dimensional reconstructions.     FINDINGS:     Implants: Bilateral silicone implants are intact. No extracapsular rupture or  extravasation.     Background parenchymal enhancement: Minimal.     Lymph nodes: No lymphadenopathy.     Right breast: There is no suspicious focus of morphology or temporal  enhancement. Normal skin and nipple.     Left breast: 4 mm enhancement in the skin at  the 7-8:00 position is new. No  definite mass. Susceptibility artifact and soft tissue loss are postsurgical and  unchanged.     Miscellaneous: None.     Impression  1. 4 mm nonspecific skin enhancement in the left breast 7-8:00 position may  correspond to the physical exam finding. No mass or abscess.  2. No MRI evidence of malignancy in the right breast.  3. Intact bilateral silicone implants.  4. No lymphadenopathy.     Assessment: ACR BI-RADS category  Left breast: BI-RADS Assessment Category 4: Suspicious abnormality - Biopsy  should be performed in the absence of clinical contraindication.  Right breast: BI-RADS Assessment Category 1: Negative.     Recommendation: Physical exam of the left breast to determine if the focal  redness resolves with antibiotics. If no resolution, consider punch biopsy of  the skin.     A negative breast MRI examination speaks strongly against invasive cancer down  to a detection threshold of 3 to 5 mm but may not detect some lower grade or in  situ carcinomas. Therefore, routine clinical and mammographic followup are  recommended.  A summary portfolio has been created in PACS.      Past Medical History:   Diagnosis Date    Anxiety     Arthritis     Basal cell carcinoma     Bronchiectasis ([place]) 08/2020    Cancer ([place])     left breast  cancer    Chronic tension headaches     Concussion 2011    Headache(784.0)     OCCAS    Hx of radiation therapy     COMPLETED IN 2014    Ill-defined condition     cellulitis L UE    Lymphedema of arm     left    Nausea & vomiting     Other and unspecified symptoms and signs involving general sensations and perceptions     HIVES RECENTLY- WAS IN ER Feb 19, 2013    Other ill-defined conditions(799.89)     PHEOCHROMOCYTOMA    S/P chemotherapy, time since greater than 12 weeks     COMPLETED CHEMOTHERAPY IN JUNE 2014    SCCA (squamous cell carcinoma) of skin        Past Surgical History:   Procedure Laterality Date    APPENDECTOMY      BREAST RECONSTRUCTION   11/28/2012    BREAST RECONSTRUCTION /C  INSERTION EXPANDER & ALLODERM performed by Sharlotte Alamo, MD at [place] Bilateral 09/16/2013    REMOVAL AND REPLACEMENT LEFT BREAST IMPLANT TISSUE EXPANDERS WITH SUBTOTAL CAPSULECTOMY, RIGHT BREAST IMPLANT REMOVAL AND REPLACEMENT FOR SYMMETRY/FAT GRAFTING TO BILATERAL BREASTS performed by Sharlotte Alamo, MD at [place]  '91    breast implants; replaced bilat implants 2011    BREAST SURGERY Left 04/22/2014    REMOVAL OF LEFT BREAST IMPLANT, WASHOUT AND CLOSURE OF LEFT BREAST  WOUND performed by Sharlotte Alamo, MD at [place]      c-section    HEENT      right adrenal removed    HEENT      T&A    MASTECTOMY Left     MASTECTOMY, MODIFIED RADICAL  11/28/2012    LEFT BREAST MODIFIED RADICAL MASTECTOMY WITH AXILLARY DISSECTION W/ RECONSTRUCTION LEFT BREAST WITH TISSUE EXPANDER AND ALLODERM;PORTACATH INSERTION performed by Ty Hilts, MD at [place]      bilateral shoulder arthroscopy    OVARY REMOVAL      right    PR UNLISTED PROCEDURE ABDOMEN PERITONEUM & OMENTUM      right adenal removed    PR UNLISTED PROCEDURE ABDOMEN PERITONEUM & OMENTUM      hernia mesh repair    VASCULAR SURGERY      PORT, THEN REMOVED       Social History     Socioeconomic History    Marital status: Single     Spouse name: Not on file    Number of children: Not on file    Years of education: Not on file    Highest education level: Not on file   Occupational History    Not on file   Tobacco Use    Smoking status: Never    Smokeless tobacco: Never   Substance and Sexual Activity    Alcohol use: No    Drug use: No    Sexual activity: Not on file   Other Topics Concern    Not on file   Social History Narrative    Not on file     Social Determinants of Health     Financial Resource Strain: Not on file   Food Insecurity: Not on file   Transportation Needs: Not on file   Physical Activity: Not on file   Stress:  Not on file   Social Connections: Not on file   Intimate Partner Violence: Not on file   Housing Stability: Not on file       Current Outpatient Medications on File Prior to Visit   Medication Sig Dispense Refill    amoxicillin-clavulanate (AUGMENTIN) 875-125 MG per tablet Take 1 tablet by mouth 2 times daily 20 tablet 1    Mastectomy Bra MISC by Does not apply route Mastectomy bras and prosthesis 6 each 0    acetaminophen (TYLENOL) 500 MG tablet Take 1 tablet by mouth every 6 hours as needed      albuterol sulfate HFA (PROVENTIL;VENTOLIN;PROAIR) 108 (90 Base) MCG/ACT inhaler INHALE 2 PUFFS BY MOUTH 4 TIMES A DAY AS NEEDED      DULoxetine (CYMBALTA) 30 MG extended release capsule Take 1 capsule by mouth 2 times daily      fluticasone furoate-vilanterol (BREO ELLIPTA) 100-25 MCG/ACT inhaler ceived the following from [facility]: Outside name: Breo Ellipta 100-25 mcg/dose inhaler      oxyCODONE (ROXICODONE) 5 MG immediate release tablet Take 1 tablet by mouth every 6 hours as needed.      sodium chloride, Inhalant, (HYPER-SAL) 7 % nebulizer solution 1 (ONE) VIAL TWO TIMES DAILY, AFTER ALBUTEROL, WITH AEROBIKA      zolpidem (AMBIEN) 10 MG tablet Take 1 tablet by mouth nightly.       No current facility-administered medications on file prior to visit.       Allergies   Allergen Reactions    Bacitracin  Hives and Swelling    Nsaids Swelling     Throat swells    Aspirin Palpitations and Swelling     Other reaction(s): Unknown (comments)  Gi bleeding  Other reaction(s): Other (see comments), Rapid pulse  Other reaction(s): Unknown (comments)  Gi bleeding    Prochlorperazine Rash       OB History    No obstetric history on file.      Obstetric Comments   Menarche 71, LMP age 29, # of children 2, age of 51st delivery 51, Hysterectomy/oophorectomy no/yes, Breast bx yes/left 2013, history of breast feeding no, BCP no, Hormone therapy in the past               Review of Systems  All other systems reviewed and are  negative.       Physical Exam  Exam conducted with a chaperone present.   Constitutional:       Appearance: She is not diaphoretic.   HENT:      Head: Normocephalic and atraumatic.      Right Ear: External ear normal.      Left Ear: External ear normal.   Eyes:      General: No scleral icterus.        Right eye: No discharge.         Left eye: No discharge.      Pupils: Pupils are equal, round, and reactive to light.   Cardiovascular:      Rate and Rhythm: Normal rate and regular rhythm.   Pulmonary:      Effort: Pulmonary effort is normal. No respiratory distress.      Breath sounds: Normal breath sounds. No stridor.   Chest:       Abdominal:      General: There is no distension.      Palpations: Abdomen is soft. There is no mass.      Tenderness: There is no abdominal tenderness.   Musculoskeletal:         General: Normal range of motion.      Cervical back: Normal range of motion and neck supple.   Lymphadenopathy:      Cervical: No cervical adenopathy.   Skin:     General: Skin is warm.   Neurological:      Mental Status: She is alert and oriented to person, place, and time.   Psychiatric:         Behavior: Behavior normal.         Thought Content: Thought content normal.         Judgment: Judgment normal.       ASSESSMENT and PLAN   Diagnosis Orders   1. History of left breast cancer          Established patient presents for evaluation of LEFT breast lesion, and is doing well overall. LEFT breast lesion is unchanged. Discussed excision of lesion and patient agreed with the plan. We will schedule her for an excisional biopsy.     This plan was reviewed with the patient and patient agrees. All questions were answered.    Total time spent excluding procedural time was 20 minutes.    Written by Conchita Paris, [place], as dictated by Dr. Sandford Craze, MD.

## 2022-10-12 NOTE — Telephone Encounter (Signed)
Excisional bx left breast, gen, stm, na

## 2022-10-12 NOTE — Telephone Encounter (Signed)
The patient called asking for a PET scan to be ordered. Per Dr. Drucilla Schmidt, would be after surgery and per [name].  She also asked that Lattie Haw call her and give her a heads up on a ballpark for surgery because she has a 70 year old that is completely dependent on her and she needs to plan for childcare. I told her I would pass the message along to [place]. She was appreciative.

## 2022-10-18 NOTE — Telephone Encounter (Signed)
Message left to return call.

## 2022-10-20 ENCOUNTER — Encounter

## 2022-10-23 ENCOUNTER — Inpatient Hospital Stay
Admit: 2022-10-23 | Discharge: 2022-10-25 | Disposition: A | Discharge status: Home / Self Care | Payer: Medicare Other | Admitting: Family Medicine

## 2022-10-23 DIAGNOSIS — L03114 Cellulitis of left upper limb: Secondary | ICD-10-CM

## 2022-10-23 LAB — COMPREHENSIVE METABOLIC PANEL
ALT: 96 U/L — ABNORMAL HIGH (ref 12–78)
AST: 71 U/L — ABNORMAL HIGH (ref 15–37)
Albumin/Globulin Ratio: 1.3 (ref 1.1–2.2)
Albumin: 3.8 g/dL (ref 3.5–5.0)
Alk Phosphatase: 80 U/L (ref 45–117)
Anion Gap: 5 mmol/L (ref 5–15)
BUN: 12 MG/DL (ref 6–20)
Bun/Cre Ratio: 27 — ABNORMAL HIGH (ref 12–20)
CO2: 29 mmol/L (ref 21–32)
Calcium: 8.9 MG/DL (ref 8.5–10.1)
Chloride: 101 mmol/L (ref 97–108)
Creatinine: 0.45 MG/DL — ABNORMAL LOW (ref 0.55–1.02)
Est, Glom Filt Rate: >60 mL/min/{1.73_m2} (ref >60)
Globulin: 3 g/dL (ref 2.0–4.0)
Glucose: 104 mg/dL — ABNORMAL HIGH (ref 65–100)
Potassium: 4 mmol/L (ref 3.5–5.1)
Sodium: 135 mmol/L — ABNORMAL LOW (ref 136–145)
Total Bilirubin: 0.5 MG/DL (ref 0.2–1.0)
Total Protein: 6.8 g/dL (ref 6.4–8.2)

## 2022-10-23 LAB — EXTRA TUBES HOLD

## 2022-10-23 LAB — CBC WITH AUTO DIFFERENTIAL
Absolute Immature Granulocyte: 0 10*3/uL (ref 0.00–0.04)
Basophils %: 0 % (ref 0–1)
Basophils Absolute: 0 10*3/uL (ref 0.0–0.1)
Eosinophils %: 1 % (ref 0–7)
Eosinophils Absolute: 0.1 10*3/uL (ref 0.0–0.4)
Hematocrit: 36.2 % (ref 35.0–47.0)
Hemoglobin: 12.4 g/dL (ref 11.5–16.0)
Immature Granulocytes: 0 % (ref 0.0–0.5)
Lymphocytes %: 8 % — ABNORMAL LOW (ref 12–49)
Lymphocytes Absolute: 1.2 10*3/uL (ref 0.8–3.5)
MCH: 30.6 PG (ref 26.0–34.0)
MCHC: 34.3 g/dL (ref 30.0–36.5)
MCV: 89.4 FL (ref 80.0–99.0)
MPV: 9.7 FL (ref 8.9–12.9)
Monocytes %: 5 % (ref 5–13)
Monocytes Absolute: 0.7 10*3/uL (ref 0.0–1.0)
Neutrophils %: 86 % — ABNORMAL HIGH (ref 32–75)
Neutrophils Absolute: 12.3 10*3/uL — ABNORMAL HIGH (ref 1.8–8.0)
Nucleated RBCs: 0 PER 100 WBC
Platelets: 270 10*3/uL (ref 150–400)
RBC: 4.05 M/uL (ref 3.80–5.20)
RDW: 13.7 % (ref 11.5–14.5)
WBC: 14.4 10*3/uL — ABNORMAL HIGH (ref 3.6–11.0)
nRBC: 0 10*3/uL (ref 0.00–0.01)

## 2022-10-23 LAB — LACTIC ACID: Lactic Acid, Plasma: 0.7 MMOL/L (ref 0.4–2.0)

## 2022-10-23 MED ORDER — POLYETHYLENE GLYCOL 3350 17 G PO PACK
17 g | Freq: Every day | ORAL | Status: DC | PRN
Start: 2022-10-23 — End: 2022-10-25

## 2022-10-23 MED ORDER — NORMAL SALINE FLUSH 0.9 % IV SOLN
0.9 % | Freq: Two times a day (BID) | INTRAVENOUS | Status: DC
Start: 2022-10-23 — End: 2022-10-25
  Administered 2022-10-24: 02:00:00 10 mL via INTRAVENOUS

## 2022-10-23 MED ORDER — ENOXAPARIN SODIUM 40 MG/0.4ML IJ SOSY
40 MG/0.4ML | Freq: Every day | INTRAMUSCULAR | Status: DC
Start: 2022-10-23 — End: 2022-10-25

## 2022-10-23 MED ORDER — ALBUTEROL SULFATE (2.5 MG/3ML) 0.083% IN NEBU
Freq: Four times a day (QID) | RESPIRATORY_TRACT | Status: DC | PRN
Start: 2022-10-23 — End: 2022-10-25
  Administered 2022-10-25: 01:00:00 2.5 mg via RESPIRATORY_TRACT

## 2022-10-23 MED ORDER — SODIUM CHLORIDE 0.9 % IV SOLN
0.9 % | INTRAVENOUS | Status: DC | PRN
Start: 2022-10-23 — End: 2022-10-25

## 2022-10-23 MED ORDER — ONDANSETRON HCL 4 MG/2ML IJ SOLN
4 MG/2ML | Freq: Four times a day (QID) | INTRAMUSCULAR | Status: DC | PRN
Start: 2022-10-23 — End: 2022-10-25

## 2022-10-23 MED ORDER — ONDANSETRON 4 MG PO TBDP
4 MG | Freq: Three times a day (TID) | ORAL | Status: DC | PRN
Start: 2022-10-23 — End: 2022-10-25

## 2022-10-23 MED ORDER — PIPERACILLIN SOD-TAZOBACTAM SO 4.5 (4-0.5) G IV SOLR
4.5 (4-0.5) g | Freq: Once | INTRAVENOUS | Status: AC
Start: 2022-10-23 — End: 2022-10-23
  Administered 2022-10-23: 23:00:00 4500 mg via INTRAVENOUS

## 2022-10-23 MED ORDER — VANCOMYCIN HCL 1.5 G IV SOLR
1.5 g | Freq: Once | INTRAVENOUS | Status: AC
Start: 2022-10-23 — End: 2022-10-23
  Administered 2022-10-23: 21:00:00 1500 mg/kg via INTRAVENOUS

## 2022-10-23 MED ORDER — OXYCODONE-ACETAMINOPHEN 5-325 MG PO TABS
5-325 MG | Freq: Four times a day (QID) | ORAL | Status: DC | PRN
Start: 2022-10-23 — End: 2022-10-25
  Administered 2022-10-24 – 2022-10-25 (×6): 1 via ORAL

## 2022-10-23 MED ORDER — DULOXETINE HCL 30 MG PO CPEP
30 MG | Freq: Two times a day (BID) | ORAL | Status: DC
Start: 2022-10-23 — End: 2022-10-25
  Administered 2022-10-24 – 2022-10-25 (×4): 30 mg via ORAL

## 2022-10-23 MED ORDER — ACETAMINOPHEN 325 MG PO TABS
325 MG | ORAL | Status: AC
Start: 2022-10-23 — End: 2022-10-23
  Administered 2022-10-23: 21:00:00 650 mg via ORAL

## 2022-10-23 MED ORDER — SODIUM CHLORIDE 0.9 % IV SOLN (MINI-BAG)
0.9 % | Freq: Three times a day (TID) | INTRAVENOUS | Status: DC
Start: 2022-10-23 — End: 2022-10-25
  Administered 2022-10-24 – 2022-10-25 (×5): 3375 mg via INTRAVENOUS

## 2022-10-23 MED ORDER — ACETAMINOPHEN 650 MG RE SUPP
650 MG | Freq: Four times a day (QID) | RECTAL | Status: DC | PRN
Start: 2022-10-23 — End: 2022-10-25

## 2022-10-23 MED ORDER — ACETAMINOPHEN 325 MG PO TABS
325 MG | Freq: Four times a day (QID) | ORAL | Status: DC | PRN
Start: 2022-10-23 — End: 2022-10-25

## 2022-10-23 MED ORDER — VANCOMYCIN INTERMITTENT DOSING (PLACEHOLDER)
INTRAVENOUS | Status: DC
Start: 2022-10-23 — End: 2022-10-25

## 2022-10-23 MED ORDER — ZOLPIDEM TARTRATE 5 MG PO TABS
5 MG | Freq: Every evening | ORAL | Status: DC
Start: 2022-10-23 — End: 2022-10-25
  Administered 2022-10-24 – 2022-10-25 (×2): 10 mg via ORAL

## 2022-10-23 MED ORDER — NORMAL SALINE FLUSH 0.9 % IV SOLN
0.9 % | INTRAVENOUS | Status: DC | PRN
Start: 2022-10-23 — End: 2022-10-25

## 2022-10-23 MED ORDER — VANCOMYCIN HCL 1.25 G IV SOLR
1.25 g | INTRAVENOUS | Status: DC
Start: 2022-10-23 — End: 2022-10-23

## 2022-10-23 MED ORDER — VIAL2BAG ADAPTOR (20 MM)
0.9 % | Freq: Three times a day (TID) | INTRAVENOUS | Status: DC
Start: 2022-10-23 — End: 2022-10-25
  Administered 2022-10-24 – 2022-10-25 (×5): 750 mg via INTRAVENOUS

## 2022-10-23 MED ORDER — SODIUM CHLORIDE 0.9 % IV SOLN
0.9 % | INTRAVENOUS | Status: DC
Start: 2022-10-23 — End: 2022-10-25
  Administered 2022-10-23 – 2022-10-25 (×2): via INTRAVENOUS

## 2022-10-23 MED ORDER — MORPHINE SULFATE (PF) 4 MG/ML IV SOLN
4 MG/ML | INTRAVENOUS | Status: AC
Start: 2022-10-23 — End: 2022-10-23
  Administered 2022-10-23: 21:00:00 4 mg via INTRAVENOUS

## 2022-10-23 MED ORDER — BUDESONIDE 0.25 MG/2ML IN SUSP
0.25 MG/2ML | Freq: Two times a day (BID) | RESPIRATORY_TRACT | Status: DC
Start: 2022-10-23 — End: 2022-10-25
  Administered 2022-10-24 – 2022-10-25 (×2): via RESPIRATORY_TRACT

## 2022-10-23 MED FILL — ACETAMINOPHEN 325 MG PO TABS: 325 MG | ORAL | Qty: 2

## 2022-10-23 MED FILL — MORPHINE SULFATE 4 MG/ML IV SOLN: 4 mg/mL | INTRAVENOUS | Qty: 1

## 2022-10-23 MED FILL — SODIUM CHLORIDE 0.9 % IV SOLN: 0.9 % | INTRAVENOUS | Qty: 1000

## 2022-10-23 MED FILL — PIPERACILLIN SOD-TAZOBACTAM SO 4.5 (4-0.5) G IV SOLR: 4.5 (4-0.5) g | INTRAVENOUS | Qty: 4500

## 2022-10-23 MED FILL — ZOLPIDEM TARTRATE 10 MG PO TABS: 10 MG | ORAL | Qty: 1

## 2022-10-23 MED FILL — VANCOMYCIN HCL 1.5 G IV SOLR: 1.5 g | INTRAVENOUS | Qty: 1500

## 2022-10-23 MED FILL — MONOJECT FLUSH SYRINGE 0.9 % IV SOLN: 0.9 % | INTRAVENOUS | Qty: 40

## 2022-10-23 NOTE — ED Notes (Addendum)
ED TO INPATIENT SBAR HANDOFF  Receiving RN: Secundino Ginger  Patient Name: Emily [name]   DOB:  [birth year]  72 y.o.   MRN:  [id number]  ED Room #:  ER06/06  Family/Caregiver Present no   Restraints no   Sitter no   Sepsis Risk Score Sepsis Risk Score: 1.3    Situation  Code Status: Full Code     Allergies: Bacitracin, Nsaids, Aspirin, and Prochlorperazine  Weight: Patient Vitals for the past 96 hrs (Last 3 readings):   Weight   10/23/22 1107 57.2 kg (126 lb 1.7 oz)     Arrived from: home  Chief Complaint:   Chief Complaint   Patient presents with    Cellulitis     Hospital Problem/Diagnosis:  Principal Problem:    Cellulitis  Resolved Problems:    * No resolved hospital problems. *    Imaging:   No orders to display     Abnormal labs:   Abnormal Labs Reviewed   CBC WITH AUTO DIFFERENTIAL - Abnormal; Notable for the following components:       Result Value    WBC 14.4 (*)     Neutrophils % 86 (*)     Lymphocytes % 8 (*)     Neutrophils Absolute 12.3 (*)     All other components within normal limits   COMPREHENSIVE METABOLIC PANEL - Abnormal; Notable for the following components:    Sodium 135 (*)     Glucose 104 (*)     Creatinine 0.45 (*)     Bun/Cre Ratio 27 (*)     ALT 96 (*)     AST 71 (*)     All other components within normal limits         Abnormal Assessment Findings: LEFT upper extremity is swollen, red, warm to touch, WBC elevated    Background  History:   Past Medical History:   Diagnosis Date    Anxiety     Arthritis     Basal cell carcinoma     Bronchiectasis (HCC) 08/2020    Cancer ([place])     left breast  cancer    Chronic tension headaches     Concussion 2011    Headache(784.0)     OCCAS    Hx of radiation therapy     COMPLETED IN 2014    Ill-defined condition     cellulitis L UE    Lymphedema of arm     left    Nausea & vomiting     Other and unspecified symptoms and signs involving general sensations and perceptions     HIVES RECENTLY- WAS IN ER Feb 19, 2013    Other ill-defined conditions(799.89)      PHEOCHROMOCYTOMA    S/P chemotherapy, time since greater than 12 weeks     COMPLETED CHEMOTHERAPY IN JUNE 2014    SCCA (squamous cell carcinoma) of skin        Assessment    Vitals/MEWS: MEWS Score: 1  Level of Consciousness: Alert (0)   Vitals:    10/23/22 1107 10/23/22 1608 10/23/22 1615 10/23/22 1700   BP: (!) 154/72  (!) 123/58 (!) 114/52   Pulse: 97  76 75   Resp: '16 16 18 19   '$ Temp: 97.3 F (36.3 C)  98.2 F (36.8 C)    TempSrc: Temporal  Oral    SpO2: 100%      Weight: 57.2 kg (126 lb 1.7 oz)      Height: 1.676  m ('5\' 6"'$ )        DI:   Predictive Model Details          23 (Normal)  Factor Value    Calculated 10/23/2022 17:57 69% Age 57 years old    Deterioration Index Model 19% Respiratory rate 19     15% WBC count abnormal (14.4 K/uL)     7% Sodium abnormal (135 mmol/L)     6% Pulse oximetry 960 %     0% Systolic 454     0% Potassium 4.0 mmol/L     0% Hematocrit 36.2 %     0% Pulse 75     0% Temperature 98.2 F (36.8 C)       FiO2 (%): NONE ROOM AIR  O2 Flow Rate: O2 Device: None (Room air)    Cardiac Rhythm:    Pain Scale: Pain Assessment  Pain Assessment: 0-10  Pain Level: 5  Patient's Stated Pain Goal: 0 - No pain  Pain Location: Arm  Pain Orientation: Left  Pain Descriptors: Burning, Sharp, Tightness  Functional Pain Assessment: Activities are not prevented  Non-Pharmaceutical Pain Intervention(s): Repositioned, Ice  Last documented pain score (0-10 scale) Pain Level: 5  Last documented pain medication administered: 1605       Mental Status: alert, coherent, logical, thought processes intact, and able to concentrate and follow conversation  Orientation Level:    NIH Score:    C-SSRS: Risk of Suicide: No Risk  Bedside swallow: 3 oz Water Swallow Screen: Pass  Glasgow Coma Scale (GCS): Glasgow Coma Scale  Eye Opening: Spontaneous  Best Verbal Response: Oriented  Best Motor Response: Obeys commands  Glasgow Coma Scale Score: 15  Active LDA's:   Peripheral IV 10/23/22 Right Antecubital (Active)   Site  Assessment Clean, dry & intact 10/23/22 1125   Line Status Blood return noted;Flushed;Normal saline locked;Specimen collected 10/23/22 [place] checked and tightened 10/23/22 1125   Phlebitis Assessment No symptoms 10/23/22 1125   Infiltration Assessment 0 10/23/22 1125   Dressing Status New dressing applied;Clean, dry & intact 10/23/22 1125   Dressing Type Transparent 10/23/22 1125   Dressing Intervention New 10/23/22 1125     PO Status: Regular  Pertinent or High Risk Medications/Drips: no   If Yes, please provide details: NONE  Titratable drips: no   Pending Blood Product Administration: no   Mobility: no current mobility problem   ED Fall Risk: Presents to emergency department  because of falls (Syncope, seizure, or loss of consciousness): No, Age > 45: No, Altered Mental Status, Intoxication with alcohol or substance confusion (Disorientation, impaired judgment, poor safety awaremess, or inability to follow instructions): No, Impaired Mobility: Ambulates or transfers with assistive devices or assistance; Unable to ambulate or transer.: No, Nursing Judgement: No     You may also review the ED PT Care Timeline found under the Summary Nursing Index tab.    Recommendation    Pending orders NONE, labs in the AM for [facility]  Consults ordered: PHARMACY TO [facility] provider:      Plan for next 24 hours: monitor LUE, resume breathing treatments PRN, home meds etc, supportive care for breast cancer, wait on blood culture results  Additional Comments: patient is a walky talky patient, patient is well versed in her own health care and super nice!     If any further questions, please call Sending RN at [phone]  Electronically signed by: Electronically signed by Page Spiro, RN  on 10/23/2022 at 5:57 PM

## 2022-10-23 NOTE — ED Notes (Signed)
E transfer report given to Moshe Salisbury RN on inpatient unit for transfer of care. Transport ordered for patient.

## 2022-10-23 NOTE — ED Provider Notes (Signed)
[facility] EMERGENCY DEP  EMERGENCY DEPARTMENT ENCOUNTER      Pt Name: Emily [name]  MRN: [id number]  [place] [birth year]  Date of evaluation: 10/23/2022  Provider: Andrey Farmer, MD    CHIEF COMPLAINT       Chief Complaint   Patient presents with    Cellulitis         HISTORY OF PRESENT ILLNESS   (Location/Symptom, Timing/Onset, Context/Setting, Quality, Duration, Modifying Factors, Severity)  Note limiting factors.   Patient is a 72 year old female present emergency department for cellulitis of the left upper extremity.  Patient states that when she woke up this morning she knew that she was developing cellulitis of the left upper extremity as she developed acute onset redness, swelling involving the entire left arm.  She states that she has been seen by infectious disease in the past as she typically has 1 episode of left arm cellulitis occurring annually.  Patient is status post left breast cancer patient underwent left breast modified radical mastectomy with axillary lymph node removal in the past.  Patient has had chills but denies any fevers she states that typically when this happens she requires vancomycin, clindamycin for resolution.            Review of External Medical Records:     Nursing Notes were reviewed.    REVIEW OF SYSTEMS    (2-9 systems for level 4, 10 or more for level 5)     Review of Systems    Except as noted above the remainder of the review of systems was reviewed and negative.       PAST MEDICAL HISTORY     Past Medical History:   Diagnosis Date    Anxiety     Arthritis     Basal cell carcinoma     Bronchiectasis ([place]) 08/2020    Cancer ([place])     left breast  cancer    Chronic tension headaches     Concussion 2011    Headache(784.0)     OCCAS    Hx of radiation therapy     COMPLETED IN 2014    Ill-defined condition     cellulitis L UE    Lymphedema of arm     left    Nausea & vomiting     Other and unspecified symptoms and signs involving general sensations and perceptions     HIVES  RECENTLY- WAS IN ER Feb 19, 2013    Other ill-defined conditions(799.89)     PHEOCHROMOCYTOMA    S/P chemotherapy, time since greater than 12 weeks     COMPLETED CHEMOTHERAPY IN JUNE 2014    SCCA (squamous cell carcinoma) of skin          SURGICAL HISTORY       Past Surgical History:   Procedure Laterality Date    APPENDECTOMY      BREAST RECONSTRUCTION  11/28/2012    BREAST RECONSTRUCTION /C  INSERTION EXPANDER & ALLODERM performed by Sharlotte Alamo, MD at [place] Bilateral 09/16/2013    REMOVAL AND REPLACEMENT LEFT BREAST IMPLANT TISSUE EXPANDERS WITH SUBTOTAL CAPSULECTOMY, RIGHT BREAST IMPLANT REMOVAL AND REPLACEMENT FOR SYMMETRY/FAT GRAFTING TO BILATERAL BREASTS performed by Sharlotte Alamo, MD at [place]  '91    breast implants; replaced bilat implants 2011    BREAST SURGERY Left 04/22/2014    REMOVAL OF LEFT BREAST IMPLANT, WASHOUT AND CLOSURE OF LEFT BREAST  WOUND performed by Sharlotte Alamo, MD at [place]      c-section    HEENT      right adrenal removed    HEENT      T&A    MASTECTOMY Left     MASTECTOMY, MODIFIED RADICAL  11/28/2012    LEFT BREAST MODIFIED RADICAL MASTECTOMY WITH AXILLARY DISSECTION W/ RECONSTRUCTION LEFT BREAST WITH TISSUE EXPANDER AND ALLODERM;PORTACATH INSERTION performed by Ty Hilts, MD at [place]      bilateral shoulder arthroscopy    OVARY REMOVAL      right    PR UNLISTED PROCEDURE ABDOMEN PERITONEUM & OMENTUM      right adenal removed    PR UNLISTED PROCEDURE ABDOMEN PERITONEUM & OMENTUM      hernia mesh repair    VASCULAR SURGERY      PORT, THEN REMOVED         CURRENT MEDICATIONS       Previous Medications    ACETAMINOPHEN (TYLENOL) 500 MG TABLET    Take 1 tablet by mouth every 6 hours as needed    ALBUTEROL SULFATE HFA (PROVENTIL;VENTOLIN;PROAIR) 108 (90 BASE) MCG/ACT INHALER    INHALE 2 PUFFS BY MOUTH 4 TIMES A DAY AS NEEDED    AMOXICILLIN-CLAVULANATE (AUGMENTIN) 875-125  MG PER TABLET    Take 1 tablet by mouth 2 times daily    DULOXETINE (CYMBALTA) 30 MG EXTENDED RELEASE CAPSULE    Take 1 capsule by mouth 2 times daily    FLUTICASONE FUROATE-VILANTEROL (BREO ELLIPTA) 100-25 MCG/ACT INHALER    ceived the following from [facility]: Outside name: Breo Ellipta 100-25 mcg/dose inhaler    MASTECTOMY BRA MISC    by Does not apply route Mastectomy bras and prosthesis    OXYCODONE (ROXICODONE) 5 MG IMMEDIATE RELEASE TABLET    Take 1 tablet by mouth every 6 hours as needed.    SODIUM CHLORIDE, INHALANT, (HYPER-SAL) 7 % NEBULIZER SOLUTION    1 (ONE) VIAL TWO TIMES DAILY, AFTER ALBUTEROL, WITH AEROBIKA    ZOLPIDEM (AMBIEN) 10 MG TABLET    Take 1 tablet by mouth nightly.       ALLERGIES     Bacitracin, Nsaids, Aspirin, and Prochlorperazine    FAMILY HISTORY       Family History   Problem Relation Age of Onset    Diabetes Father     Anesth Problems Neg Hx     Alzheimer's Disease Mother     Diabetes Mother     Other Mother         COMBATIVE POST ANESTHESIA    Heart Disease Father     Headache Sister     Lung Disease Father         COPD    Migraines Sister           SOCIAL HISTORY       Social History     Socioeconomic History    Marital status: Single     Spouse name: None    Number of children: None    Years of education: None    Highest education level: None   Tobacco Use    Smoking status: Never    Smokeless tobacco: Never   Substance and Sexual Activity    Alcohol use: No    Drug use: No           PHYSICAL EXAM    (  up to 7 for level 4, 8 or more for level 5)     ED Triage Vitals [10/23/22 1107]   BP Temp Temp Source Pulse Respirations SpO2 Height Weight - Scale   (!) 154/72 97.3 F (36.3 C) Temporal 97 16 100 % 1.676 m ('5\' 6"'$ ) 57.2 kg (126 lb 1.7 oz)       Body mass index is 20.35 kg/m.    Physical Exam  Vitals and nursing note reviewed.   Constitutional:       Appearance: Normal appearance.   HENT:      Head: Normocephalic and atraumatic.      Nose: Nose normal.   Eyes:       Extraocular Movements: Extraocular movements intact.      Pupils: Pupils are equal, round, and reactive to light.   Cardiovascular:      Rate and Rhythm: Normal rate and regular rhythm.      Pulses: Normal pulses.      Heart sounds: Normal heart sounds.   Pulmonary:      Effort: Pulmonary effort is normal.      Breath sounds: Normal breath sounds.   Abdominal:      General: Bowel sounds are normal.      Palpations: Abdomen is soft.   Musculoskeletal:         General: Normal range of motion.      Left upper arm: Swelling, edema and tenderness present.        Arms:       Cervical back: Normal range of motion and neck supple.      Comments: Extensive erythema, swelling extending left lower arm to the left upper arm to the shoulder.   Skin:     General: Skin is warm and dry.   Neurological:      General: No focal deficit present.      Mental Status: She is alert and oriented to person, place, and time. Mental status is at baseline.   Psychiatric:         Mood and Affect: Mood normal.         Behavior: Behavior normal.         DIAGNOSTIC RESULTS     EKG: All EKG's are interpreted by the Emergency Department Physician who either signs or Co-signs this chart in the absence of a cardiologist.        RADIOLOGY:   Non-plain film images such as CT, Ultrasound and MRI are read by the radiologist. Plain radiographic images are visualized and preliminarily interpreted by the emergency physician with the below findings:        Interpretation per the Radiologist below, if available at the time of this note:    No orders to display        LABS:  Labs Reviewed   CBC WITH AUTO DIFFERENTIAL - Abnormal; Notable for the following components:       Result Value    WBC 14.4 (*)     Neutrophils % 86 (*)     Lymphocytes % 8 (*)     Neutrophils Absolute 12.3 (*)     All other components within normal limits   COMPREHENSIVE METABOLIC PANEL - Abnormal; Notable for the following components:    Sodium 135 (*)     Glucose 104 (*)     Creatinine  0.45 (*)     Bun/Cre Ratio 27 (*)     ALT 96 (*)     AST 71 (*)  All other components within normal limits   CULTURE, BLOOD 1   CULTURE, BLOOD 2   LACTIC ACID   EXTRA TUBES HOLD       All other labs were within normal range or not returned as of this dictation.    EMERGENCY DEPARTMENT COURSE and DIFFERENTIAL DIAGNOSIS/MDM:   Vitals:    Vitals:    10/23/22 1107   BP: (!) 154/72   Pulse: 97   Resp: 16   Temp: 97.3 F (36.3 C)   TempSrc: Temporal   SpO2: 100%   Weight: 57.2 kg (126 lb 1.7 oz)   Height: 1.676 m ('5\' 6"'$ )           Medical Decision Making  Cellulitis left upper extremity.  72 year old female present emergency department with sudden onset erythema, swelling of the left upper extremity.  Patient is status post left radical breast mastectomy with axillary lymph node removal in the past.  Patient is otherwise well-appearing however patient will require IV antibiotics admission potentially infectious disease consult.  Will start patient on vancomycin now.            REASSESSMENT            CONSULTS:  None    PROCEDURES:  Unless otherwise noted below, none     Procedures      FINAL IMPRESSION      1. Cellulitis of left upper extremity          DISPOSITION/PLAN   DISPOSITION Decision To Admit 10/23/2022 02:28:28 PM      PATIENT REFERRED TO:  No follow-up provider specified.    DISCHARGE MEDICATIONS:  New Prescriptions    No medications on file         (Please note that portions of this note were completed with a voice recognition program.  Efforts were made to edit the dictations but occasionally words are mis-transcribed.)    Andrey Farmer, MD (electronically signed)  Emergency Attending Physician / Physician Assistant / Nurse Practitioner      Perfect Serve Consult for Admission  2:28 PM    ED Room Number: ER06/06  Patient Name and age:  Emily [name] 72 y.o.  female  Working Diagnosis:   1. Cellulitis of left upper extremity        COVID-19 Suspicion: No  Sepsis present:  No  Reassessment needed:  No  Code Status:  Full Code  Readmission: No  Isolation Requirements: no  Recommended Level of Care: med/surg  Department: [facility] Adult ED - [phone]                Andrey Farmer, MD  10/23/22 1430

## 2022-10-23 NOTE — ED Notes (Signed)
11:05 AM    I have evaluated the patient as the Provider in Rapid Medical Evaluation (RME). I have reviewed her vital signs and the triage nurse assessment. I have talked with the patient and any available family and advised that I am the provider in triage and have ordered the appropriate study to initiate their work up based on the clinical presentation during my assessment. I have advised that the patient will be accommodated in the Main ED as soon as possible. I have also requested to contact the triage nurse or myself immediately if the patient experiences any changes in their condition during this brief waiting period.    Woke up with L arm redness, swelling and pain. Concern for cellulitis, history of 7 episodes in the past. Weakness, nausea, dizziness, kidney pain, very anxious. PMH history of breast cancer and lymph node removal on that side. Recent use of Augment for mastectomy infection. Breast surgery scheduled 11/17/21. Reports she needs clindamycin and vancomycin.     Orders placed for CBC, CMP, blood cultures x 2, lactic.      Andree Coss, PA-C  10/23/22 1417

## 2022-10-23 NOTE — ED Triage Notes (Signed)
Reports L arm swelling and redness since this morning. Reports bouts of cellulitis in same arm after mastectomy and lymph node removal. +nausea, dizzy, generalized weakness.

## 2022-10-23 NOTE — Progress Notes (Signed)
Pharmacist Note - Vancomycin Dosing    Consult provided for this 72 y.o. female for indication of SSTI/cellulitis of left upper extremity (h/o mastectomy/lymph node removal)  Antibiotic regimen(s): Vancomycin, Zosyn  Patient on vancomycin PTA? NO     Recent Labs     10/23/22  1126   WBC 14.4*   CREATININE 0.45*   BUN 12     Frequency of BMP: x1 ordered, extended to 3 per protocol  Height: 167.6 cm  Weight: 57.2 kg  Est CrCl: 107 ml/min; UO: N/A ml/kg/hr  Temp (24hrs), Avg:97.3 F (36.3 C), Min:97.3 F (36.3 C), Max:97.3 F (36.3 C)    Cultures:  1/21 Blood Cultures (x2): NG<24 h, preliminary    MRSA Swab ordered (if applicable)? N/A    The plan below is expected to result in a target range of AUC/MIC 400-600    Therapy will be initiated with a loading dose of 1500 mg IV x 1 to be followed by a maintenance dose of 750 mg IV every 8 hours.  Pharmacy to follow patient daily and order levels / make dose adjustments as appropriate.    Thank you,  Nino Parsley, PharmD    *Vancomycin has been dosed used Bayesian kinetics software to target an AUC/MIC of 400-600, which provides adequate exposure for an assumed infection due to MRSA with an MIC of 1 or less while reducing the risk of nephrotoxicity as seen with traditional trough based dosing goals.

## 2022-10-23 NOTE — Progress Notes (Signed)
Admission Medication Reconciliation:      Spoke with patient at the bedside in ED-06. Patient is a reliable historian. RX query is available at this time and aligns with her report. Chart notes corroborate history.    Interview included questions regarding use of:  PTA medications including prescription/OTC, vitamins, supplements, inhaled, topical, injectable, otic and ophthalmic medications  Alcohol, nicotine products, THC and related compounds, illicit drugs, stimulant use (i.e., Red Bull), agents used to assist with sleep and/or pain control issues, and whether patient uses other people's medications of any kind    Notes:  Oxycodone: chronic therapy for pain in neck and back, arthritis    Medication changes (since last review):  Added:  Levalbuterol BID, uses with Hypersal 7% for bronchiectasis (Albuterol inhaler is reserved for PRN use)  Valtrex- PRN   Nurtec PRN  Fioricet PRN  Fluticasone nasal spray  Ketoprofen 10% PLO GEL- compounded at [place] sodium  Tylenol    Revised:  Breo Ellipta administration time to nightly    Removed:  Augmentin      Thank you for allowing me to participate in the care of your patient.    Oletta Darter PharmD, RN # [phone]       RxQuery pharmacy benefit data reflects medications filled and processed through the patient's insurance, however   this data does NOT capture whether the medication was picked up or is currently being taken by the patient.    Allergies:  Bacitracin, Nsaids, Aspirin, and Prochlorperazine    Significant PMH/Disease States:   Past Medical History:   Diagnosis Date    Anxiety     Arthritis     Basal cell carcinoma     Bronchiectasis ([place]) 08/2020    Cancer ([place])     left breast  cancer    Chronic tension headaches     Concussion 2011    Headache(784.0)     OCCAS    Hx of radiation therapy     COMPLETED IN 2014    Ill-defined condition     cellulitis L UE    Lymphedema of arm     left    Nausea & vomiting     Other and unspecified  symptoms and signs involving general sensations and perceptions     HIVES RECENTLY- WAS IN ER Feb 19, 2013    Other ill-defined conditions(799.89)     PHEOCHROMOCYTOMA    S/P chemotherapy, time since greater than 12 weeks     COMPLETED CHEMOTHERAPY IN JUNE 2014    SCCA (squamous cell carcinoma) of skin      Chief Complaint for this Admission:    Chief Complaint   Patient presents with    Cellulitis     Prior to Admission Medications:   Prior to Admission Medications   Prescriptions Last Dose Informant   DULoxetine (CYMBALTA) 30 MG extended release capsule 10/23/2022    Sig: Take 1 capsule by mouth 2 times daily   Mastectomy Bra MISC Past Week    Sig: by Does not apply route Mastectomy bras and prosthesis   NONFORMULARY     Sig: Ketoprofen 10% PLO GEL    Compounded at [employer] [facility] to neck and shoulders   Rimegepant Sulfate (NURTEC) 75 MG TBDP 10/18/2022    Sig: Take by mouth As directed   acetaminophen (TYLENOL) 500 MG tablet 10/23/2022    Sig: Take 1 tablet by mouth every 6 hours as needed   albuterol sulfate  HFA (PROVENTIL;VENTOLIN;PROAIR) 108 (90 Base) MCG/ACT inhaler 10/22/2022    Sig: INHALE 2 PUFFS BY MOUTH 4 TIMES A DAY AS NEEDED   butalbital-acetaminophen-caffeine (FIORICET, ESGIC) 50-325-40 MG per tablet     Sig: Take 1 tablet by mouth every 4 hours as needed for Headaches or Migraine   docusate sodium (COLACE) 100 MG capsule     Sig: Take 1 capsule by mouth 2 times daily as needed for Constipation   fluticasone (FLONASE) 50 MCG/ACT nasal spray     Sig: 1 spray by Each Nostril route daily   fluticasone furoate-vilanterol (BREO ELLIPTA) 100-25 MCG/ACT inhaler 10/22/2022    Sig: Inhale 1 puff into the lungs nightly   levalbuterol (XOPENEX) 1.25 MG/3ML nebulizer solution     Sig: Take 3 mLs by nebulization in the morning and at bedtime   oxyCODONE (ROXICODONE) 5 MG immediate release tablet Past Week    Sig: Take 1 tablet by mouth every 6 hours as needed.   sodium chloride, Inhalant, (HYPER-SAL) 7 %  nebulizer solution Unknown    Sig: Take 4 mLs by nebulization in the morning and at bedtime After Levalbuterol   valACYclovir (VALTREX) 500 MG tablet     Sig: Take 1 tablet by mouth 2 times daily As needed for outbreaks   zolpidem (AMBIEN) 10 MG tablet 10/22/2022    Sig: Take 1 tablet by mouth nightly.      Facility-Administered Medications: None     Please contact the main inpatient pharmacy with any questions or concerns at [phone] and we will direct you to the clinical pharmacist covering this patient's care while in-house.   Chesley Noon, [facility]

## 2022-10-23 NOTE — H&P (Signed)
History and Physical    Date of Service:  10/23/2022  Primary Care Provider: None, None  Source of information: The patient and Chart review    Chief Complaint: Cellulitis    History of Presenting Illness:   Emily [name] is a 72 y.o. female who presented to the ED for cellulitis of the left upper extremity. Patient indicated that she usually wears a compression garment on her left arm and when she woke up this morning with an acute onset of redness and swelling involving the entire left arm.  She indicated that she has had several cases of cellulitis in her left upper extremity, seemingly always happens about this time annually.  She has a history of breast cancer and underwent a left breast modified radical mastectomy with axillary lymph node removal in the past.  She sees Dr. Drucilla Schmidt and reports that she is actually supposed to have a surgical procedure mid February for an area on her left breast that is suspicious and concerning.  She reports no other major medical conditions other than bronchiectasis for which she usually has inhalers and anxiety for which she takes citalopram.    Patient reported a headaches but no dizziness, chest pain or pressure, shortness of breath or cough.  Denies any GI complaints such as nausea, vomiting, diarrhea or constipation and has no dysuria.  Patient complained of thirst during exam, she was provided water.  Medication reconciliation was completed with patient at bedside.    Patient was agreeable to Percocet for pain management.     REVIEW OF SYSTEMS:  As mentioned above in HPI    Past Medical History:   Diagnosis Date    Anxiety     Arthritis     Basal cell carcinoma     Bronchiectasis ([place]) 08/2020    Cancer ([place])     left breast  cancer    Chronic tension headaches     Concussion 2011    Headache(784.0)     OCCAS    Hx of radiation therapy     COMPLETED IN 2014    Ill-defined condition     cellulitis L UE    Lymphedema of arm     left    Nausea & vomiting      Other and unspecified symptoms and signs involving general sensations and perceptions     HIVES RECENTLY- WAS IN ER Feb 19, 2013    Other ill-defined conditions(799.89)     PHEOCHROMOCYTOMA    S/P chemotherapy, time since greater than 12 weeks     COMPLETED CHEMOTHERAPY IN JUNE 2014    SCCA (squamous cell carcinoma) of skin       Past Surgical History:   Procedure Laterality Date    APPENDECTOMY      BREAST RECONSTRUCTION  11/28/2012    BREAST RECONSTRUCTION /C  INSERTION EXPANDER & ALLODERM performed by Sharlotte Alamo, MD at [place] Bilateral 09/16/2013    REMOVAL AND REPLACEMENT LEFT BREAST IMPLANT TISSUE EXPANDERS WITH SUBTOTAL CAPSULECTOMY, RIGHT BREAST IMPLANT REMOVAL AND REPLACEMENT FOR SYMMETRY/FAT GRAFTING TO BILATERAL BREASTS performed by Sharlotte Alamo, MD at [place]  '91    breast implants; replaced bilat implants 2011    BREAST SURGERY Left 04/22/2014    REMOVAL OF LEFT BREAST IMPLANT, WASHOUT AND CLOSURE OF LEFT BREAST WOUND performed by Sharlotte Alamo, MD at [place]  c-section    HEENT      right adrenal removed    HEENT      T&A    MASTECTOMY Left     MASTECTOMY, MODIFIED RADICAL  11/28/2012    LEFT BREAST MODIFIED RADICAL MASTECTOMY WITH AXILLARY DISSECTION W/ RECONSTRUCTION LEFT BREAST WITH TISSUE EXPANDER AND ALLODERM;PORTACATH INSERTION performed by Ty Hilts, MD at [place]      bilateral shoulder arthroscopy    OVARY REMOVAL      right    PR UNLISTED PROCEDURE ABDOMEN PERITONEUM & OMENTUM      right adenal removed    PR UNLISTED PROCEDURE ABDOMEN PERITONEUM & OMENTUM      hernia mesh repair    VASCULAR SURGERY      PORT, THEN REMOVED     Prior to Admission medications    Medication Sig Start Date End Date Taking? Authorizing Provider   amoxicillin-clavulanate (AUGMENTIN) 875-125 MG per tablet Take 1 tablet by mouth 2 times daily 16/10/96  Yes Pellicane Renne Musca, MD   Mastectomy Bra  MISC by Does not apply route Mastectomy bras and prosthesis 06/21/22  Yes Jeanine Luz, APRN - NP   acetaminophen (TYLENOL) 500 MG tablet Take 1 tablet by mouth every 6 hours as needed   Yes Automatic Reconciliation, Ar   albuterol sulfate HFA (PROVENTIL;VENTOLIN;PROAIR) 108 (90 Base) MCG/ACT inhaler INHALE 2 PUFFS BY MOUTH 4 TIMES A DAY AS NEEDED 09/03/21  Yes Automatic Reconciliation, Ar   DULoxetine (CYMBALTA) 30 MG extended release capsule Take 1 capsule by mouth 2 times daily 03/06/18  Yes Automatic Reconciliation, Ar   fluticasone furoate-vilanterol (BREO ELLIPTA) 100-25 MCG/ACT inhaler ceived the following from [facility]: Outside name: [place] Patter 100-25 mcg/dose inhaler 09/03/21  Yes Automatic Reconciliation, Ar   oxyCODONE (ROXICODONE) 5 MG immediate release tablet Take 1 tablet by mouth every 6 hours as needed.   Yes Automatic Reconciliation, Ar   zolpidem (AMBIEN) 10 MG tablet Take 1 tablet by mouth nightly. 03/10/16  Yes Automatic Reconciliation, Ar   sodium chloride, Inhalant, (HYPER-SAL) 7 % nebulizer solution 1 (ONE) VIAL TWO TIMES DAILY, AFTER ALBUTEROL, WITH AEROBIKA 07/19/21   Automatic Reconciliation, Ar     Allergies   Allergen Reactions    Bacitracin Hives and Swelling    Nsaids Swelling     Throat swells    Aspirin Palpitations and Swelling     Other reaction(s): Unknown (comments)  Gi bleeding  Other reaction(s): Other (see comments), Rapid pulse  Other reaction(s): Unknown (comments)  Gi bleeding    Prochlorperazine Rash      Family History   Problem Relation Age of Onset    Diabetes Father     Anesth Problems Neg Hx     Alzheimer's Disease Mother     Diabetes Mother     Other Mother         COMBATIVE POST ANESTHESIA    Heart Disease Father     Headache Sister     Lung Disease Father         COPD    Migraines Sister       Social History:  reports that she has never smoked. She has never used smokeless tobacco. She reports that she does not drink alcohol and does not use drugs.    Social Determinants of Health     Tobacco Use: Low Risk  (10/23/2022)    Patient History  Smoking Tobacco Use: Never     Smokeless Tobacco Use: Never     Passive Exposure: Not on file   Alcohol Use: Not on file   Financial Resource Strain: Not on file   Food Insecurity: Not on file   Transportation Needs: Not on file   Physical Activity: Not on file   Stress: Not on file   Social Connections: Not on file   Intimate Partner Violence: Not on file   Depression: Not at risk (09/07/2021)    PHQ-2     PHQ-2 Score: 0   Housing Stability: Not on file   Interpersonal Safety: Not on file   Utilities: Not on file      Medications were reconciled to the best of my ability given all available resources at the time of admission. Route is PO if not otherwise noted.     Family and social history were personally reviewed, all pertinent and relevant details are outlined as above.    Objective:   BP (!) 154/72   Pulse 97   Temp 97.3 F (36.3 C) (Temporal)   Resp 16   Ht 1.676 m ('5\' 6"'$ )   Wt 57.2 kg (126 lb 1.7 oz)   SpO2 100%   BMI 20.35 kg/m         PHYSICAL EXAM:     General: Older Caucasian female sitting up on an ED stretcher in no acute distress.  Cooperative and interactive with assessment  HEENT: EOMI, moist mucus membranes  Neck: Trachea midline.   Chest: Clear to auscultation bilaterally, sats 97% on room air  CVS: RRR, heart rate 87.  No murmur noted.  Abd: Soft, non-tender, non-distended, +bowel sounds   Ext: Left upper extremity with redness from shoulder down to wrist, area is warm to touch with mild edema.  Hand is unaffected.  Neuro/Psych: Pleasant mood and affect, sensory grossly within normal limit, Strength 5/5 in all extremities  Skin: Erythema to left upper extremity    Data Review:   I have independently reviewed and interpreted patient's lab and all other diagnostic data    Abnormal Labs Reviewed   CBC WITH AUTO DIFFERENTIAL - Abnormal; Notable for the following components:       Result Value    WBC  14.4 (*)     Neutrophils % 86 (*)     Lymphocytes % 8 (*)     Neutrophils Absolute 12.3 (*)     All other components within normal limits   COMPREHENSIVE METABOLIC PANEL - Abnormal; Notable for the following components:    Sodium 135 (*)     Glucose 104 (*)     Creatinine 0.45 (*)     Bun/Cre Ratio 27 (*)     ALT 96 (*)     AST 71 (*)     All other components within normal limits     IMAGING:   No orders to display      Notes reviewed from all clinical/nonclinical/nursing services involved in patient's clinical care. Care coordination discussions were held with appropriate clinical/nonclinical/ nursing providers based on care coordination needs.     Assessment/Plan:     Cellulitis of LUE  - presented to ED with redness and erythema of LUE  - afebrile  - wbc 14.4; lactic acid 0.7  - blood cultures <24 hours  - given vancomycin in the ED - will add Zosyn  - monitor LUE for improvement  - percocet for pain mgmt (requested pain meds with tylenol)  Hx Bronchiectasis  - resume home treatments    Hx Breast Cancer  - Follows with Dr. Drucilla Schmidt  - Supportive care    DIET: ADULT DIET; Regular   ISOLATION PRECAUTIONS: No active isolations  CODE STATUS: Full Code   Central Line:  none  DVT PROPHYLAXIS: Lovenox  FUNCTIONAL STATUS PRIOR TO HOSPITALIZATION: Fully active and ambulatory; able to carry on all self-care without restriction.  Ambulatory status/function: By self   EARLY MOBILITY ASSESSMENT: Recommend routine ambulation while hospitalized with the assistance of nursing staff  ANTICIPATED DISCHARGE: Greater than 48 hours.  ANTICIPATED DISPOSITION: Home  EMERGENCY CONTACT/SURROGATE DECISION MAKER: Nickola Major [phone]    CRITICAL CARE WAS PERFORMED FOR THIS ENCOUNTER: NO.    Signed By: Mirian Mo, APRN - NP     October 23, 2022       Please note that this dictation may have been completed with Dragon, the computer voice recognition software.  Quite often unanticipated grammatical, syntax, homophones, and other  interpretive errors are inadvertently transcribed by the computer software.  Please disregard these errors.  Please excuse any errors that have escaped final proofreading.

## 2022-10-24 LAB — CBC WITH AUTO DIFFERENTIAL
Absolute Immature Granulocyte: 0 10*3/uL (ref 0.00–0.04)
Basophils %: 0 % (ref 0–1)
Basophils Absolute: 0 10*3/uL (ref 0.0–0.1)
Eosinophils %: 3 % (ref 0–7)
Eosinophils Absolute: 0.2 10*3/uL (ref 0.0–0.4)
Hematocrit: 31.2 % — ABNORMAL LOW (ref 35.0–47.0)
Hemoglobin: 10.4 g/dL — ABNORMAL LOW (ref 11.5–16.0)
Immature Granulocytes: 0 % (ref 0.0–0.5)
Lymphocytes %: 20 % (ref 12–49)
Lymphocytes Absolute: 1.7 10*3/uL (ref 0.8–3.5)
MCH: 30.2 PG (ref 26.0–34.0)
MCHC: 33.3 g/dL (ref 30.0–36.5)
MCV: 90.7 FL (ref 80.0–99.0)
MPV: 9.5 FL (ref 8.9–12.9)
Monocytes %: 9 % (ref 5–13)
Monocytes Absolute: 0.8 10*3/uL (ref 0.0–1.0)
Neutrophils %: 68 % (ref 32–75)
Neutrophils Absolute: 5.8 10*3/uL (ref 1.8–8.0)
Nucleated RBCs: 0 PER 100 WBC
Platelets: 222 10*3/uL (ref 150–400)
RBC: 3.44 M/uL — ABNORMAL LOW (ref 3.80–5.20)
RDW: 13.6 % (ref 11.5–14.5)
WBC: 8.6 10*3/uL (ref 3.6–11.0)
nRBC: 0 10*3/uL (ref 0.00–0.01)

## 2022-10-24 LAB — BASIC METABOLIC PANEL
Anion Gap: 6 mmol/L (ref 5–15)
BUN: 10 MG/DL (ref 6–20)
Bun/Cre Ratio: 24 — ABNORMAL HIGH (ref 12–20)
CO2: 26 mmol/L (ref 21–32)
Calcium: 8.4 MG/DL — ABNORMAL LOW (ref 8.5–10.1)
Chloride: 108 mmol/L (ref 97–108)
Creatinine: 0.42 MG/DL — ABNORMAL LOW (ref 0.55–1.02)
Est, Glom Filt Rate: >60 mL/min/{1.73_m2} (ref >60)
Glucose: 119 mg/dL — ABNORMAL HIGH (ref 65–100)
Potassium: 3.8 mmol/L (ref 3.5–5.1)
Sodium: 140 mmol/L (ref 136–145)

## 2022-10-24 MED ORDER — VANCOMYCIN INTERMITTENT DOSING (PLACEHOLDER)
Freq: Once | INTRAVENOUS | Status: DC
Start: 2022-10-24 — End: 2022-10-25

## 2022-10-24 MED FILL — OXYCODONE-ACETAMINOPHEN 5-325 MG PO TABS: 5-325 MG | ORAL | Qty: 1

## 2022-10-24 MED FILL — VANCOMYCIN HCL 750 MG IV SOLR: 750 MG | INTRAVENOUS | Qty: 750

## 2022-10-24 MED FILL — DULOXETINE HCL 30 MG PO CPEP: 30 MG | ORAL | Qty: 1

## 2022-10-24 MED FILL — ENOXAPARIN SODIUM 40 MG/0.4ML IJ SOSY: 40 MG/0.4ML | INTRAMUSCULAR | Qty: 0.4

## 2022-10-24 MED FILL — PIPERACILLIN SOD-TAZOBACTAM SO 3.375 (3-0.375) G IV SOLR: 3.375 (3-0.375) g | INTRAVENOUS | Qty: 3375

## 2022-10-24 MED FILL — VANCOMYCIN INTERMITTENT DOSING (PLACEHOLDER): INTRAVENOUS | Qty: 1

## 2022-10-24 MED FILL — ARFORMOTEROL TARTRATE 15 MCG/2ML IN NEBU: 15 MCG/2ML | RESPIRATORY_TRACT | Qty: 2

## 2022-10-24 MED FILL — ZOLPIDEM TARTRATE 5 MG PO TABS: 5 MG | ORAL | Qty: 2

## 2022-10-24 NOTE — Progress Notes (Cosign Needed)
[facility]                                                                                          Hospitalist Progress Note  Leretha Pol, [place]  Answering service: [phone] OR [phone] from in house phone        Date of Service:  10/24/2022  NAME:  Emily [name]  DOB:  Sep 18, 1951  MRN:  [id number]      Admission Summary:   Emily [name] is a 72 y.o. female who presented to the ED for cellulitis of the left upper extremity. Patient indicated that she usually wears a compression garment on her left arm and when she woke up this morning with an acute onset of redness and swelling involving the entire left arm.  She indicated that she has had several cases of cellulitis in her left upper extremity, seemingly always happens about this time annually.  She has a history of breast cancer and underwent a left breast modified radical mastectomy with axillary lymph node removal in the past.  She sees Dr. Maxwell Marion and reports that she is actually supposed to have a surgical procedure mid February for an area on her left breast that is suspicious and concerning.  She reports no other major medical conditions other than bronchiectasis for which she usually has inhalers and anxiety for which she takes citalopram.     Patient reported a headaches but no dizziness, chest pain or pressure, shortness of breath or cough. Denies any GI complaints such as nausea, vomiting, diarrhea or constipation and has no dysuria. Patient complained of thirst during exam, she was provided water. Medication reconciliation was completed with patient at bedside     Interval history / Subjective:   Saw the patient on rounds, her spouse was present at bedside. She reports improvement in the erythema and edema of the LUE, pains is well controlled at this time.     Discussed the plan with the patient, will continue IV ABX for today and deescalate to PO tomorrow pending cultures     Assessment & Plan:      Cellulitis of LUE  - Edema of the LUE improved today per patient  - Consulted WOCN for compression dressing   - BCX with NGTD  - ABX: Vanc/zosyn for now, can likely deescalate to PO therapy tomorrow  - percocet for pain mgmt (requested pain meds with tylenol)     Hx Bronchiectasis  - resume home treatments     Hx Breast Cancer  - Follows with Dr. Maxwell Marion  - Supportive care     Code status: Full  Prophylaxis: Lovenox  Care Plan discussed with: Patient, Spouse, RN, Attending  Anticipated Disposition: Home, likely tomorrow           Review of Systems:   Pertinent items are noted in HPI.       Vital Signs:    Last 24hrs VS reviewed since prior progress note. Most recent are:  Vitals:    10/24/22 0924   BP:    Pulse:    Resp: 20   Temp:  SpO2:          Intake/Output Summary (Last 24 hours) at 10/24/2022 1158  Last data filed at 10/24/2022 1035  Gross per 24 hour   Intake 800 ml   Output --   Net 800 ml        Physical Examination:     I had a face to face encounter with this patient and independently examined them on 10/24/2022 as outlined below:          Constitutional:  No acute distress, cooperative, pleasant    ENT:  Oral mucosa moist, oropharynx benign.    Resp:  CTA bilaterally. No wheezing/rhonchi/rales. No accessory muscle use.    CV:  Regular rhythm, normal rate, no murmurs, gallops, rubs    GI:  Soft, non distended, non tender. normoactive bowel sounds    Musculoskeletal:  LUE edematous, trace erythema, warm, 2+ pulses throughout    Neurologic:  Moves all extremities.  AAOx3, CN II-XII reviewed            Data Review:    Review and/or order of clinical lab test  Review and/or order of tests in the radiology section of CPT  Review and/or order of tests in the medicine section of CPT      Labs:     Recent Labs     10/23/22  1126 10/24/22  0047   WBC 14.4* 8.6   HGB 12.4 10.4*   HCT 36.2 31.2*   PLT 270 222     Recent Labs     10/23/22  1126 10/24/22  0047   NA 135* 140   K 4.0 3.8   CL 101 108   CO2 29 26    BUN 12 10     Recent Labs     10/23/22  1126   ALT 96*   GLOB 3.0     No results for input(s): "INR", "APTT" in the last 72 hours.    Invalid input(s): "PTP"   No results for input(s): "TIBC", "FERR" in the last 72 hours.    Invalid input(s): "FE", "PSAT"   No results found for: "FOL", "RBCF"   No results for input(s): "PH", "PCO2", "PO2" in the last 72 hours.  No results for input(s): "CPK" in the last 72 hours.    Invalid input(s): "CPKMB", "CKNDX", "TROIQ"  No results found for: "CHOL", "CHOLX", "CHLST", "CHOLV", "HDL", "HDLC", "LDL", "LDLC", "TGLX", "TRIGL"  Lab Results   Component Value Date/Time    GLUCPOC 84 06/14/2021 01:46 PM    GLUCPOC 83 12/10/2020 11:26 AM    GLUCPOC 79 05/22/2020 10:49 AM    GLUCPOC 92 04/12/2019 09:40 AM    GLUCPOC 73 10/15/2018 09:22 AM     @LABUA @      Medications Reviewed:     Current Facility-Administered Medications   Medication Dose Route Frequency    [START ON 10/25/2022] Vancomycin random - Please draw 1/23 >4hr from midnight dose   Other Once    sodium chloride flush 0.9 % injection 5-40 mL  5-40 mL IntraVENous 2 times per day    sodium chloride flush 0.9 % injection 5-40 mL  5-40 mL IntraVENous PRN    0.9 % sodium chloride infusion   IntraVENous PRN    enoxaparin (LOVENOX) injection 40 mg  40 mg SubCUTAneous Daily    ondansetron (ZOFRAN-ODT) disintegrating tablet 4 mg  4 mg Oral Q8H PRN    Or    ondansetron (ZOFRAN) injection 4 mg  4 mg IntraVENous  Q6H PRN    polyethylene glycol (GLYCOLAX) packet 17 g  17 g Oral Daily PRN    acetaminophen (TYLENOL) tablet 650 mg  650 mg Oral Q6H PRN    Or    acetaminophen (TYLENOL) suppository 650 mg  650 mg Rectal Q6H PRN    0.9 % sodium chloride infusion   IntraVENous Continuous    piperacillin-tazobactam (ZOSYN) 3,375 mg in sodium chloride 0.9 % 50 mL IVPB (mini-bag)  3,375 mg IntraVENous Q8H    albuterol (PROVENTIL) (2.5 MG/3ML) 0.083% nebulizer solution 2.5 mg  2.5 mg Nebulization Q6H PRN    arformoterol 15 mcg-budesonide 0.25 mg neb  solution   Nebulization BID RT    zolpidem (AMBIEN) tablet 10 mg  10 mg Oral Nightly    DULoxetine (CYMBALTA) extended release capsule 30 mg  30 mg Oral BID    oxyCODONE-acetaminophen (PERCOCET) 5-325 MG per tablet 1 tablet  1 tablet Oral Q6H PRN    Vancomycin - Dosing by Pharmacy  1 each Other RX Placeholder    vancomycin (VANCOCIN) 750 mg in sodium chloride 0.9 % 250 mL IVPB (Vial2Bag)  750 mg IntraVENous Q8H     ______________________________________________________________________  EXPECTED LENGTH OF STAY: 2  ACTUAL LENGTH OF STAY:          1                 Teena Irani Muriah Harsha, PA-C

## 2022-10-24 NOTE — Progress Notes (Signed)
1925: Patient notified RN that left arm that medigrip was placed on by wound care became irritated, pt removed and had mild redness on arm with increased warmth compared to this morning. Patient agrees to keep medigrip off and will continue elevating arm

## 2022-10-24 NOTE — Care Coordination-Inpatient (Signed)
Care Management Initial Assessment       RUR: 11% Low   Readmission? No  1st IM letter given? Yes - 10/24/22  1st [employer] letter given: No    CM met with patient at bedside to introduce self and explain role. Patient lives with her 70 y.o. son in a 2 story home with no steps to enter and 16 steps to enter the 2nd floor. Patient was independent with ADL's, IADL's and ambulation. Patient has no history of HH or SNF/IPR. Patient's significant other will provide transport home once medially stable.        10/24/22 1339   Service Assessment   Patient Orientation Alert and Oriented;Person;Place;Situation;Self   Cognition Alert   History Provided By Patient   Primary Caregiver Self   Accompanied By/Relationship Significant other   Support Systems Spouse/Significant Other   Patient's Healthcare Decision Maker is: Legal Next of [place]   PCP Verified by CM Yes  (Dr. Learta Codding)   Last Visit to PCP Within last year   Prior Functional Level Independent in ADLs/IADLs   Can patient return to prior living arrangement Yes   Ability to make needs known: Good   Family able to assist with home care needs: Yes   Financial Resources [employer] Resources None   Social/Functional History   Lives With Son   Type of [place] Two level   Home Equipment None   ADL Assistance Independent   Homemaking Assistance Independent   Ambulation Assistance Independent   Transfer Assistance Independent   Discharge Planning   Type of [place] Medications No   Patient expects to be discharged to: [place], MSW   [phone]

## 2022-10-24 NOTE — Wound Image (Signed)
Wound Care Note:     New consult placed by PA request for LUE lymphedema    Chart shows:  Admitted for cellulitis with a history of anxiety, arthritis, basal cell carcinoma, bronchiectasis, left breast cancer, chronic tension headaches, concussion, headaches, radiation therapy - completed, cellulitis L UE, lymphedema of left arm, nausea and vomiting, pheochromocytoma, s/p chemotherapy, squamous cell carcinoma    WBC = 8.6 on 10/24/22  Admitted from home    Assessment:   Patient is A&O x 4, communicative, continent with no assistance needed in repositioning.    Bed: [facility]  Diet: Adult diet regular  Patient reports some discomfort in left axilla; no number given.      1. POA left arm cellulitis is resolving, patient stated it was very red, left arm lymphedema is being managed by [facility], when she woke up this morning it was more edematous.  Medline Medigrip- Size E was applied and arm elevated on 3 pillows.  Patient states it feels much better.    Spoke with Shanon Brow, PA, wound care orders obtained.    Patient repositioned on supine side.     Recommendations:    Left arm- wear medigrip at all times; if tolerated well.  Elevated arm on pillows.    Skin Care & Pressure Prevention:  Minimize layers of linen/pads under patient to optimize support surface.    Turn/reposition approximately every 2 hours and offload heels.  Manage incontinence / promote continence   Nourishing Skin Cream to dry skin, minimize use of briefs when able    Discussed above plan with patient & Danae Chen, RN    Transition of Care: Plan to follow as needed while admitted to hospital.    Rodena Piety "Abigail Miyamoto, BSN, RN, [facility]  Certified Wound and Ostomy Nurse  office [phone]  Best way to contact me is through [place]

## 2022-10-25 LAB — BASIC METABOLIC PANEL
Anion Gap: 5 mmol/L (ref 5–15)
BUN: 7 MG/DL (ref 6–20)
Bun/Cre Ratio: 16 (ref 12–20)
CO2: 25 mmol/L (ref 21–32)
Calcium: 8.5 MG/DL (ref 8.5–10.1)
Chloride: 111 mmol/L — ABNORMAL HIGH (ref 97–108)
Creatinine: 0.44 MG/DL — ABNORMAL LOW (ref 0.55–1.02)
Est, Glom Filt Rate: >60 mL/min/{1.73_m2} (ref >60)
Glucose: 95 mg/dL (ref 65–100)
Potassium: 4 mmol/L (ref 3.5–5.1)
Sodium: 141 mmol/L (ref 136–145)

## 2022-10-25 LAB — EXTRA TUBES HOLD

## 2022-10-25 LAB — VANCOMYCIN LEVEL, RANDOM: Vancomycin Rm: 17.3 UG/ML

## 2022-10-25 MED ORDER — AMOXICILLIN-POT CLAVULANATE 875-125 MG PO TABS
875-125 MG | ORAL_TABLET | Freq: Two times a day (BID) | ORAL | 0 refills | Status: AC
Start: 2022-10-25 — End: 2022-10-29

## 2022-10-25 MED ORDER — FLUTICASONE PROPIONATE 50 MCG/ACT NA SUSP
50 MCG/ACT | Freq: Every day | NASAL | Status: DC
Start: 2022-10-25 — End: 2022-10-25
  Administered 2022-10-25: 16:00:00 1 via NASAL

## 2022-10-25 MED FILL — OXYCODONE-ACETAMINOPHEN 5-325 MG PO TABS: 5-325 MG | ORAL | Qty: 1

## 2022-10-25 MED FILL — ZOLPIDEM TARTRATE 5 MG PO TABS: 5 MG | ORAL | Qty: 2

## 2022-10-25 MED FILL — ARFORMOTEROL TARTRATE 15 MCG/2ML IN NEBU: 15 MCG/2ML | RESPIRATORY_TRACT | Qty: 2

## 2022-10-25 MED FILL — DULOXETINE HCL 30 MG PO CPEP: 30 MG | ORAL | Qty: 1

## 2022-10-25 MED FILL — FLUTICASONE PROPIONATE 50 MCG/ACT NA SUSP: 50 MCG/ACT | NASAL | Qty: 16

## 2022-10-25 MED FILL — VANCOMYCIN HCL 750 MG IV SOLR: 750 MG | INTRAVENOUS | Qty: 750

## 2022-10-25 MED FILL — ALBUTEROL SULFATE (2.5 MG/3ML) 0.083% IN NEBU: RESPIRATORY_TRACT | Qty: 3

## 2022-10-25 MED FILL — PIPERACILLIN SOD-TAZOBACTAM SO 3.375 (3-0.375) G IV SOLR: 3.375 (3-0.375) g | INTRAVENOUS | Qty: 3375

## 2022-10-25 NOTE — Discharge Summary (Signed)
Discharge Summary       [name]  MRN: [id number]   DATE OF BIRTH: 1950/11/11    DATE OF ADMISSION: 10/23/2022 12:54 PM    DATE OF DISCHARGE: 10/25/2022   PRIMARY CARE PROVIDER: None, None     ATTENDING PHYSICIAN: Toy Baker, MD  DISCHARGING PROVIDER: Caren Macadam, PA-C    To contact this individual call [phone] and ask the operator to page.  If unavailable ask to be transferred the Adult Hospitalist Department.    CONSULTATIONS: PHARMACY TO DOSE VANCOMYCIN  IP WOUND CARE NURSE CONSULT TO EVAL    PROCEDURES/SURGERIES: * No surgery found *     ADMITTING [place] COURSE:   Emily [name] is a 72 y.o. female who presented to the ED for cellulitis of the left upper extremity. Patient indicated that she usually wears a compression garment on her left arm and when she woke up this morning with an acute onset of redness and swelling involving the entire left arm.  She indicated that she has had several cases of cellulitis in her left upper extremity, seemingly always happens about this time annually.  She has a history of breast cancer and underwent a left breast modified radical mastectomy with axillary lymph node removal in the past.  She sees Dr. Drucilla Schmidt and reports that she is actually supposed to have a surgical procedure mid February for an area on her left breast that is suspicious and concerning.  She reports no other major medical conditions other than bronchiectasis for which she usually has inhalers and anxiety for which she takes citalopram.      Patient reported a headaches but no dizziness, chest pain or pressure, shortness of breath or cough. Denies any GI complaints such as nausea, vomiting, diarrhea or constipation and has no dysuria. Patient complained of thirst during exam, she was provided water. Medication reconciliation was completed with patient at bedside         [place] / PLAN:      Cellulitis of LUE  LUE Lymphedema  - Edema of the LUE  improved today per patient  - Consulted WOCN, appreciate assistance, provided patient with compression dressing   - BCX with NGTD  - ABX: S/P Vancomycin and zosyn, will discharge on Augmentin x 4 days to complete 7 days total of ABX  - percocet for pain mgmt (requested pain meds with tylenol)  - Recommend follow up with lymphedema clinic as scheduled  - Follow up with General Surgeon Dr. Drucilla Schmidt     Hx Bronchiectasis  - Continue home treatments     Hx Breast Cancer  - Follows with Dr. Drucilla Schmidt  - Supportive care          PENDING TEST RESULTS:   At the time of discharge the following test results are still pending: None    FOLLOW UP APPOINTMENTS:    Follow-up Information    None           ADDITIONAL CARE RECOMMENDATIONS:   Follow up with your PCP  Follow up with the lymphedema clinic  Follow up with your general surgeon, Dr. Drucilla Schmidt    DIET: regular diet    ACTIVITY: activity as tolerated    WOUND CARE: Apply compression dressing as directed, elevate the LUE when resting, follow up with the lymphedema clinic    EQUIPMENT needed: N/A      DISCHARGE MEDICATIONS:     Medication List  CONTINUE taking these medications      acetaminophen 500 MG tablet  Commonly known as: TYLENOL     albuterol sulfate HFA 108 (90 Base) MCG/ACT inhaler  Commonly known as: PROVENTIL;VENTOLIN;PROAIR     amoxicillin-clavulanate 875-125 MG per tablet  Commonly known as: AUGMENTIN  Take 1 tablet by mouth 2 times daily for 4 days     Breo Ellipta 100-25 MCG/ACT inhaler  Generic drug: fluticasone furoate-vilanterol     butalbital-acetaminophen-caffeine 50-325-40 MG per tablet  Commonly known as: FIORICET, ESGIC     docusate sodium 100 MG capsule  Commonly known as: COLACE     DULoxetine 30 MG extended release capsule  Commonly known as: CYMBALTA     fluticasone 50 MCG/ACT nasal spray  Commonly known as: FLONASE     HYPER-SAL 7 % nebulizer solution  Generic drug: sodium chloride (Inhalant)     levalbuterol 1.25 MG/3ML nebulizer  solution  Commonly known as: XOPENEX     Mastectomy Bra Misc  by Does not apply route Mastectomy bras and prosthesis     NONFORMULARY     Nurtec 75 MG Tbdp  Generic drug: Rimegepant Sulfate     oxyCODONE 5 MG immediate release tablet  Commonly known as: ROXICODONE     valACYclovir 500 MG tablet  Commonly known as: VALTREX     zolpidem 10 MG tablet  Commonly known as: AMBIEN               Where to Get Your Medications        These medications were sent to [id number] [place] - [id number] - P [phone] - F [phone] [address], [place] [address]     Phone: [phone]  amoxicillin-clavulanate 875-125 MG per tablet           NOTIFY YOUR PHYSICIAN FOR ANY OF THE FOLLOWING:   Fever over 101 degrees for 24 hours.   Chest pain, shortness of breath, fever, chills, nausea, vomiting, diarrhea, change in mentation, falling, weakness, bleeding. Severe pain or pain not relieved by medications.  Or, any other signs or symptoms that you may have questions about.    DISPOSITION:   x Home With:   OT  PT  HH  RN       Long term SNF/Inpatient Rehab    Independent/assisted living    Hospice    Other:       PATIENT CONDITION AT DISCHARGE:     Functional status    Poor     Deconditioned    x Independent      Cognition    x Lucid     Forgetful     Dementia      Catheters/lines (plus indication)    Foley     PICC     PEG    x None      Code status    x Full code     DNR      PHYSICAL EXAMINATION AT DISCHARGE:    General : alert x 3, awake, no acute distress,   HEENT: PEERL, EOMI, moist mucus membrane  Neck: supple, no JVD, no meningeal signs  Chest: Clear to auscultation bilaterally   CVS: S1 S2 heard, Capillary refill less than 2 seconds  Abd: soft/ Non tender, non distended, BS physiological,   Ext: LUE edema noted, erythema improved, no clubbing, no cyanosis, brisk 2+ DP pulses  Neuro/Psych: pleasant mood and affect, CN 2-12 grossly  intact, sensory grossly within normal limit, Strength 5/5 in  all extremities  Skin: warm     CHRONIC MEDICAL DIAGNOSES:  Principal Problem:    Cellulitis  Resolved Problems:    * No resolved hospital problems. *        Greater than 31 minutes were spent with the patient on counseling and coordination of care    Signed:   Caren Macadam, PA-C  10/25/2022  11:22 AM

## 2022-10-25 NOTE — Telephone Encounter (Signed)
Patient left a message stating that she has been hospitalized at [facility] for cellulitis of the upper extremity, lymphatic drainage, related to her lymphedema.  She wanted Dr. Drucilla Schmidt to be aware and she is also requesting that Dr. Drucilla Schmidt call her back to discuss her hospitalization.

## 2022-10-25 NOTE — Telephone Encounter (Signed)
Called and answered question, gave number for Rosealee Albee at [facility] for plastics and lymphedema

## 2022-10-25 NOTE — Discharge Instructions (Signed)
Discharge Instructions       [name]  MRN: [id number]   DATE OF BIRTH: April 27, 1951    DATE OF ADMISSION: 10/23/2022   DATE OF DISCHARGE: 10/25/2022    PRIMARY CARE PROVIDER: None, None     ATTENDING PHYSICIAN: Toy Baker, MD   DISCHARGING PROVIDER: Caren Macadam, PA-C    To contact this individual call [phone] and ask the operator to page.   If unavailable ask to be transferred the Adult Hospitalist Department.    DISCHARGE DIAGNOSES     Cellulitis of LUE  LUE Lymphedema  - Edema of the LUE improved today per patient  - Consulted WOCN, appreciate assistance, provided patient with compression dressing   - BCX with NGTD  - ABX: S/P Vancomycin and zosyn, will discharge on Augmentin x 4 days to complete 7 days total of ABX  - percocet for pain mgmt (requested pain meds with tylenol)  - Recommend follow up with lymphedema clinic as scheduled  - Follow up with General Surgeon Dr. Drucilla Schmidt     Hx Bronchiectasis  - Continue home treatments     Hx Breast Cancer  - Follows with Dr. Drucilla Schmidt  - Supportive care    CONSULTATIONS: PHARMACY TO [place] TO EVAL    PROCEDURES/SURGERIES: * No surgery found *    PENDING TEST RESULTS:   At the time of discharge the following test results are still pending: None    FOLLOW UP APPOINTMENTS:   '@DCFOLLOWUP'$ @     ADDITIONAL CARE RECOMMENDATIONS:   Follow up with your PCP  Follow up with the lymphedema clinic  Follow up with your general surgeon, Dr. Drucilla Schmidt    DIET: regular diet    ACTIVITY: activity as tolerated    WOUND CARE: Apply compression dressing as directed, elevate the LUE when resting, follow up with the lymphedema clinic     EQUIPMENT needed: N/A      DISCHARGE MEDICATIONS:   See Medication Reconciliation Form    It is important that you take the medication exactly as they are prescribed.   Keep your medication in the bottles provided by the pharmacist and keep a list of the medication names, dosages,  and times to be taken in your wallet.   Do not take other medications without consulting your doctor.       NOTIFY YOUR PHYSICIAN FOR ANY OF THE FOLLOWING:   Fever over 101 degrees for 24 hours.   Chest pain, shortness of breath, fever, chills, nausea, vomiting, diarrhea, change in mentation, falling, weakness, bleeding. Severe pain or pain not relieved by medications.  Or, any other signs or symptoms that you may have questions about.      DISPOSITION:   x Home With:   OT  PT  HH  RN       SNF/Inpatient Rehab/LTAC    Independent/assisted living    Hospice    Other:     CDMP Checked:   Yes x     PROBLEM LIST Updated:  Yes x       Signed:   Caren Macadam, PA-C  10/25/2022  11:26 AM

## 2022-10-25 NOTE — Progress Notes (Signed)
Pharmacist Note - Vancomycin Dosing  Therapy day 3  Indication: SSTI/cellulitis of the left upper extremity  Current regimen: 750 mg IV every 8 hours    Recent Labs     10/23/22  1126 10/24/22  0047 10/25/22  0444   WBC 14.4* 8.6  --    CREATININE 0.45* 0.42* 0.44*   BUN '12 10 7       '$ A random vancomycin level of 17.3 mcg/mL was obtained and from this level, the patient's AUC24 is calculated to be 493 with the current regimen.     Goal target range AUC/MIC 400-500      Plan: Continue current regimen. Pharmacy will continue to monitor this patient daily for changes in clinical status and renal function.    *Random vancomycin levels are used to calculate AUC/MIC, this level should not be interpreted as a trough. Vancomycin has been dosed using Bayesian kinetics software to target an AUC24:MIC of 400-600, which provides adequate exposure for as assumed infection due to MRSA with an MIC of 1 or less while reducing the risk of nephrotoxicity as seen with traditional trough based dosing goals.

## 2022-10-26 LAB — CULTURE, BLOOD 1: Culture: NO GROWTH

## 2022-10-26 LAB — CULTURE, BLOOD 2: Culture: NO GROWTH

## 2022-10-28 ENCOUNTER — Ambulatory Visit: Admit: 2022-10-28 | Discharge: 2022-10-28 | Payer: MEDICARE | Attending: Surgery

## 2022-10-28 DIAGNOSIS — C50312 Malignant neoplasm of lower-inner quadrant of left female breast: Secondary | ICD-10-CM

## 2022-10-28 NOTE — Progress Notes (Unsigned)
HISTORY OF PRESENT ILLNESS  Emily [name] is a 72 y.o. female     HPI ESTABLISHED Patient here for follow up for surgical discussion.     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 1/14 nodes positive with a 1 cm met, no ECE. 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  09/16/2013: LEFT mastectomy scar and capsule, excision: Fibromuscular tissue with nonspecific chronic inflammation and radiation atypia. No evidence of malignancy.  04/22/2014: LEFT mastectomy skin, scar revision: Benign dense collagen with granulation tissue and skin with foreign body giant cell reaction   MammaPrint: HIGH RISK, Luminal type A     12/25/2012 - 02/26/2013: S/p TC q 3 weeks x 4 (75 mg/m2; 600 mg/m2)  04/02/2013 - 05/10/2013: S/p XRT  05/17/2013 - 07/30/2013: Anastrozole, stopped due to joint pains   08/06/2013 - 09/30/2013: Exemestane stopped due joint pains  07/10/2013: Zometa q 6 months x 3 years, stopped   09/30/2013 - 03/08/2014: Tamoxifen, stopped 03/08/14 due to joint pains  03/23/2014 - 09/18/2014: Letrozole, stopped due to joint pains  10/04/2014 - 12/15/2014: Tamoxifen, stopped due to hot flashes and mood swings.  12/16/2014 - 01/30/2015: Anastrozole, stopped due to headache and   dizziness  02/09/2015 - 05/2018: Toremefine         Review of Systems      Physical Exam       ASSESSMENT and PLAN  {Assessment and Plan Chronic Disease:[id number]}

## 2022-10-28 NOTE — Telephone Encounter (Signed)
You can cancel her surgery.    Thanks

## 2022-10-28 NOTE — Progress Notes (Unsigned)
HISTORY OF PRESENT ILLNESS  Emily [name] is a 72 y.o. female.    HPI ESTABLISHED Patient here for follow up for surgical discussion.        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 1/14 nodes positive with a 1 cm met, no ECE. 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  09/16/2013: LEFT mastectomy scar and capsule, excision: Fibromuscular tissue with nonspecific chronic inflammation and radiation atypia. No evidence of malignancy.  04/22/2014: LEFT mastectomy skin, scar revision: Benign dense collagen with granulation tissue and skin with foreign body giant cell reaction   MammaPrint: HIGH RISK, Luminal type A    12/25/2012 - 02/26/2013: S/p TC q 3 weeks x 4 (75 mg/m2; 600 mg/m2)  04/02/2013 - 05/10/2013: S/p XRT  05/17/2013 - 07/30/2013: Anastrozole, stopped due to joint pains   08/06/2013 - 09/30/2013: Exemestane stopped due joint pains  07/10/2013: Zometa q 6 months x 3 years, stopped   09/30/2013 - 03/08/2014: Tamoxifen, stopped 03/08/14 due to joint pains  03/23/2014 - 09/18/2014: Letrozole, stopped due to joint pains  10/04/2014 - 12/15/2014: Tamoxifen, stopped due to hot flashes and mood swings.  12/16/2014 - 01/30/2015: Anastrozole, stopped due to headache and   dizziness  02/09/2015 - 05/2018: Toremefine    Past Medical History:   Diagnosis Date    Anxiety     Arthritis     Basal cell carcinoma     Bronchiectasis (HCC) 08/2020    Cancer (HCC)     left breast  cancer    Chronic tension headaches     Concussion 2011    Headache(784.0)     OCCAS    Hx of radiation therapy     COMPLETED IN 2014    Ill-defined condition     cellulitis L UE    Lymphedema of arm     left    Nausea & vomiting     Other and unspecified symptoms and signs involving general sensations and perceptions     HIVES RECENTLY- WAS IN ER Feb 19, 2013    Other ill-defined conditions(799.89)     PHEOCHROMOCYTOMA    S/P chemotherapy, time since greater than 12 weeks     COMPLETED  CHEMOTHERAPY IN JUNE 2014    SCCA (squamous cell carcinoma) of skin        Past Surgical History:   Procedure Laterality Date    APPENDECTOMY      BREAST RECONSTRUCTION  11/28/2012    BREAST RECONSTRUCTION /C  INSERTION EXPANDER & ALLODERM performed by Ivar Bury, MD at [facility] AMBULATORY OR    BREAST RECONSTRUCTION Bilateral 09/16/2013    REMOVAL AND REPLACEMENT LEFT BREAST IMPLANT TISSUE EXPANDERS WITH SUBTOTAL CAPSULECTOMY, RIGHT BREAST IMPLANT REMOVAL AND REPLACEMENT FOR SYMMETRY/FAT GRAFTING TO BILATERAL BREASTS performed by Ivar Bury, MD at  [facility] MAIN OR    BREAST SURGERY  '91    breast implants; replaced bilat implants 2011    BREAST SURGERY Left 04/22/2014    REMOVAL OF LEFT BREAST IMPLANT, WASHOUT AND CLOSURE OF LEFT BREAST WOUND performed by Ivar Bury, MD at [facility] MAIN OR    GYN      c-section    HEENT      right adrenal removed    HEENT      T&A    MASTECTOMY Left     MASTECTOMY, MODIFIED RADICAL  11/28/2012    LEFT BREAST MODIFIED RADICAL MASTECTOMY WITH AXILLARY DISSECTION W/ RECONSTRUCTION LEFT BREAST WITH  TISSUE EXPANDER AND ALLODERM;PORTACATH INSERTION performed by Binnie Rail, MD at [facility] AMBULATORY OR    ORTHOPEDIC SURGERY      bilateral shoulder arthroscopy    OVARY REMOVAL      right    PR UNLISTED PROCEDURE ABDOMEN PERITONEUM & OMENTUM      right adenal removed    PR UNLISTED PROCEDURE ABDOMEN PERITONEUM & OMENTUM      hernia mesh repair    VASCULAR SURGERY      PORT, THEN REMOVED       Social History     Socioeconomic History    Marital status: Single     Spouse name: Not on file    Number of children: Not on file    Years of education: Not on file    Highest education level: Not on file   Occupational History    Not on file   Tobacco Use    Smoking status: Never    Smokeless tobacco: Never   Substance and Sexual Activity    Alcohol use: No    Drug use: No    Sexual activity: Not on file   Other Topics Concern    Not on file   Social History Narrative    Not on file     Social  Determinants of Health     Financial Resource Strain: Not on file   Food Insecurity: Not on file   Transportation Needs: Not on file   Physical Activity: Not on file   Stress: Not on file   Social Connections: Not on file   Intimate Partner Violence: Not on file   Housing Stability: Not on file       Current Outpatient Medications on File Prior to Visit   Medication Sig Dispense Refill    amoxicillin-clavulanate (AUGMENTIN) 875-125 MG per tablet Take 1 tablet by mouth 2 times daily for 4 days 8 tablet 0    levalbuterol (XOPENEX) 1.25 MG/3ML nebulizer solution Take 3 mLs by nebulization in the morning and at bedtime      NONFORMULARY Ketoprofen 10% PLO GEL    Compounded at [employer] to neck and shoulders      docusate sodium (COLACE) 100 MG capsule Take 1 capsule by mouth 2 times daily as needed for Constipation      butalbital-acetaminophen-caffeine (FIORICET, ESGIC) 50-325-40 MG per tablet Take 1 tablet by mouth every 4 hours as needed for Headaches or Migraine      Rimegepant Sulfate (NURTEC) 75 MG TBDP Take by mouth As directed      fluticasone (FLONASE) 50 MCG/ACT nasal spray 1 spray by Each Nostril route daily      valACYclovir (VALTREX) 500 MG tablet Take 1 tablet by mouth 2 times daily As needed for outbreaks      Mastectomy Bra MISC by Does not apply route Mastectomy bras and prosthesis 6 each 0    acetaminophen (TYLENOL) 500 MG tablet Take 1 tablet by mouth every 6 hours as needed      albuterol sulfate HFA (PROVENTIL;VENTOLIN;PROAIR) 108 (90 Base) MCG/ACT inhaler INHALE 2 PUFFS BY MOUTH 4 TIMES A DAY AS NEEDED      DULoxetine (CYMBALTA) 30 MG extended release capsule Take 1 capsule by mouth 2 times daily      fluticasone furoate-vilanterol (BREO ELLIPTA) 100-25 MCG/ACT inhaler Inhale 1 puff into the lungs nightly      oxyCODONE (ROXICODONE) 5 MG immediate release tablet Take 1 tablet by mouth every  6 hours as needed.      sodium chloride, Inhalant, (HYPER-SAL) 7 % nebulizer solution Take 4 mLs  by nebulization in the morning and at bedtime After Levalbuterol      zolpidem (AMBIEN) 10 MG tablet Take 1 tablet by mouth nightly.       No current facility-administered medications on file prior to visit.       Allergies   Allergen Reactions    Bacitracin Hives and Swelling    Nsaids Swelling     Throat swells    Aspirin Palpitations and Swelling     Other reaction(s): Unknown (comments)  Gi bleeding  Other reaction(s): Other (see comments), Rapid pulse  Other reaction(s): Unknown (comments)  Gi bleeding    Prochlorperazine Rash       OB History    No obstetric history on file.      Obstetric Comments   Menarche 21, LMP age 71, # of children 2, age of 1st delivery 62, Hysterectomy/oophorectomy no/yes, Breast bx yes/left 2013, history of breast feeding no, BCP no, Hormone therapy in the past               Review of Systems        Physical Exam  Exam conducted with a chaperone present.   HENT:      Head: Normocephalic and atraumatic.      Right Ear: External ear normal.      Left Ear: External ear normal.   Eyes:      Pupils: Pupils are equal, round, and reactive to light.   Cardiovascular:      Rate and Rhythm: Normal rate and regular rhythm.   Pulmonary:      Breath sounds: Normal breath sounds.   Chest:   Breasts:     Right: Normal.       Abdominal:      Palpations: Abdomen is soft.   Musculoskeletal:         General: Normal range of motion.      Cervical back: Normal range of motion and neck supple.   Skin:     General: Skin is warm.   Neurological:      Mental Status: She is alert and oriented to person, place, and time.   Psychiatric:         Behavior: Behavior normal.         Thought Content: Thought content normal.         Judgment: Judgment normal.         ASSESSMENT and PLAN   Diagnosis Orders   1. Malignant neoplasm of lower-inner quadrant of left female breast, unspecified estrogen receptor status (HCC)        2. Lymphedema of left arm  Ambulatory Referral to Lymphedema Clinic        Patient presents to  follow up on surgical discussion, and is doing well overall. Erythema has resolved. Physical exam shows small mass in the mid LEFT reconstructed breast.   Hold off on excision for now. I wrote an order for lymphedema clinic. Pt is seeing microsurgeon at [facility] for lymphovenous bypass.    F/U in 1 year. This plan was reviewed with the patient and patient agrees. All questions were answered.    Total time spent excluding procedural time was 20 minutes.    Written by Asencion Islam, [place], as dictated by Dr. Stanford Breed, MD.

## 2022-10-31 NOTE — Progress Notes (Signed)
Assessment/Plan   [name] is a 72 yo female with history of left upper extremity lymphedema after having a   She has had multiple episodes of cellulitis requiring hospitalization.  She presents today to discuss lymphatic bypass.    Given her current state of health and her diligent lymphedema care, she would be a good candidate for LV bypass. This procedure would improve her quality of life and potentially decrease the number of episodes of cellulitis.     The patient has had many episodes of cellulitis and is compliant with therapy with a compression sleeve and lymphatic massage.  There are no irreversible skin changes.     We had a long discussion about the procedures available for surgical intervention for lymphedema.  The patient understands that these are not cures for the lymphedema.  We discussed lymphatic venous bypass and the use of lymph node transfers.  The patient understands that at best the procedures will alleviate many of the symptoms, make the garments more tolerable, and at best to allow the patient to wear the garment less frequently.  We did discuss that there is some evidence to suggest that it would decrease the risk of cellulitis.    I outlined the incisions for the patient for potential LV bypass.  The patient understands that we will have to maximally decompress prior to surgery.  This is a day surgery.  The patient also understands that we will visualize the lymphatics in the operating room and on rare occasion we are not able to identify any bypassable lymphatics.  In that situation we would not be able to offer the patient the surgery and the patient will be extubated.    At this time, the patient will be referred for a MR lymphangiogram and will then return to clinic to discuss the results.     Problem List:  Lymphedema, left upper extremity        [name] is a 72 y.o. female.  Chief Complaint   Patient presents with   . Consult     History of Present Illness:  Ms.  Emily [name] is a 72 yo female with a history of left upper extremity lymphedema who presents to discuss lymphatic bypass. Her lymphedema has been present since her left modified radical mastectomy and radiation in 2014.  She is diligent with her lymphedema care and is currently a patient at [facility]. She states that she has had a LUE cellulitis at least once a year for the past 8 years requiring hospitalization and extended courses of antibiotics. Her most recent hospitalization was earlier this month.    [name] also had a lesion on her left breast and was scheduled to have it removed by Dr. Maxwell [name] next month.  She states that it got much better after her antibiotic course. She has cancelled surgery at this time and will follow up with Dr. Maxwell [name].     Her past medical history includes a pheochromocytoma (removed in 1999) bronchiectasis (secondary to radiation therapy), and breast cancer (Left MRM, Right prophylactic mastectomy 2014 and bilateral implant reconstruction by Dr. Rosie [name]).    She denies any history of clotting disorders.  She states that her brother had a pulmonary embolism, but it was after orthopedic surgery.        Past Medical History:  Pheochromocytoma  Breast Cancer  Lymphedema   Surgical History:  See above.   Social History:  She is a never smoker.    She  currently has an 36 yo adopted son.     Family History:  Denies family history of blood clotting.     No current outpatient medications on file prior to visit.     No current facility-administered medications on file prior to visit.     Not on File  Review of Systems   All other systems reviewed and are negative.    Physical Exam:  Gen: No acute distress, patient with normal mood and affect.  Pulm: NLB on RA  Chest: Absence of bilateral breasts with implants in place. Incisions are healed. Left breast with small 2x1 cm area of redness on medial aspect of incision.   L axilla with significant scarring.   Ext:  Lymphedema starts in forearm.  Sleeve in place. The skin is soft and supple.   She has pitting edema      There were no vitals taken for this visit.        * No data to display              Relevant Results:  *Some images could not be shown.

## 2022-11-10 ENCOUNTER — Inpatient Hospital Stay: Payer: MEDICARE

## 2022-11-10 NOTE — Other (Signed)
PAT:  2-8 @ 9:30

## 2022-11-28 ENCOUNTER — Inpatient Hospital Stay: Payer: MEDICARE

## 2022-12-05 NOTE — Progress Notes (Signed)
Faxed signed lymphedema order to [facility].  Confirmation received.

## 2022-12-06 ENCOUNTER — Inpatient Hospital Stay: Payer: MEDICARE

## 2022-12-19 ENCOUNTER — Encounter

## 2022-12-19 NOTE — Progress Notes (Signed)
Subjective:   [name] is a 72 y.o. female.  Pain Scale: Pain rating = 7  out of 10     Chief Complaint: Pain of the Left Shoulder    Here for follow-up of her shoulder.  She has been having extensive pain in the left shoulder over the past couple weeks got worse when she took a dose of Levaquin she is got pain in the posterior aspect the shoulder she feels weakness in the inability to raise the arm.  Got dull achy pain the anterolateral aspect the arm that is 7/10    Review of Systems   12/19/2022            Immunological: Seasonal Allergies: Positive  Musculoskeletal: Joint Pain: Positive     Objective:   Constitutional:  No acute distress. Well nourished. Well developed.  Eyes:  Sclera are nonicteric.  Respiratory:  No labored breathing.  Cardiovascular:  No marked edema.  Skin:  No marked skin ulcers.  Neurological:  No marked sensory loss noted.  Psychiatric: Alert and oriented x3.  Musculoskeletal   For elevation 150 she got a grossly positive Neer Hawkins 3-5 supra 4 5 external rotator    Radiographs:           No imaging obtained     Assessment:     1. Left shoulder pain, unspecified chronicity         Plan:   She is weak on cuff testing she is got pain in the shoulder got worse after a dose of Levaquin.  I am worried about a tendon tear I would like to give her subacromial injection for pain relief and set her up with an MRI if she got an acute rotator cuff tear I recommend repair.  I evaluate her x-rays from December there was no major arthritic changes in the joint    Patient expresses understanding and is in agreement with the plan.  If she has any problems at all in the meantime, if symptoms worsen or new symptoms develop, she should let us know.      Large Joint Arthrocentesis: L subacromial bursa  Performed by: Blain Pais, MD  Authorized by: Blain Pais, MD      Consent given by: patient  Site marked: site marked  Timeout: Immediately prior to procedure a time out was called  to verify the correct patient, procedure, equipment, support staff and site/side marked as required   Supporting Documentation  Indications: pain   Procedure Details  Location:  Shoulder L subacromial bursa   Preparation: ethylchloride used.  Needle size: 22 G    Approach: posterior  Patient tolerance: patient tolerated the procedure well with no immediate complications    Medications administered: 9 mL lidocaine 1 %; 1 mL ketorolac 60 MG/2ML    Patient Education: Cortisone Flare Risk Discussed        Orders Placed This Encounter   . BP Patient Education      No follow-ups on file.     Cleta Alberts, MD

## 2022-12-27 NOTE — Progress Notes (Signed)
Subjective:   [name] is a 72 y.o. female.  Pain Scale: Pain rating = 7  out of 10     Chief Complaint: Pain of the Left Shoulder    Here for follow-up of shoulder she got her MRI with her I reviewed the results with her today she is got some partial tearing of the subscapular supraspinatus tendon and some fluid in the biceps tendon.  No full-thickness tears.  Mild decreased axillary fold.  Still complained of great deal pain 7/10 feels the injection gave him minimal relief    Review of Systems   12/27/2022            Immunological: Seasonal Allergies: Positive  Musculoskeletal: Joint Pain: Positive     Objective:   Constitutional:  No acute distress. Well nourished. Well developed.  Eyes:  Sclera are nonicteric.  Respiratory:  No labored breathing.  Cardiovascular:  No marked edema.  Skin:  No marked skin ulcers.  Neurological:  No marked sensory loss noted.  Psychiatric: Alert and oriented x3.  Musculoskeletal   Examination got for elevation 150 abduction 90 external rotation is 30 degrees she got a grossly positive Neer Hawkins 4 5 supra    Radiographs:           No imaging obtained     Assessment:     1. Left shoulder pain, unspecified chronicity         Plan:   She is got some partial-thickness rotator cuff tear she has a bit stiff I am wondering if she has a capsulitis in the shoulder I am going to give her an intra-articular injection today hopefully she gets some relief from that we will do a course of therapy for her neck and shoulder and see if she gets some improvement.  We will see her back in about 6-8 weeks and check her progress    Patient expresses understanding and is in agreement with the plan.  If she has any problems at all in the meantime, if symptoms worsen or new symptoms develop, she should let us know.      Large Joint Arthrocentesis: R glenohumeral  Performed by: Blain Pais, MD  Authorized by: Blain Pais, MD      Consent given by: patient  Site marked: site  marked  Timeout: Immediately prior to procedure a time out was called to verify the correct patient, procedure, equipment, support staff and site/side marked as required   Supporting Documentation  Indications: pain   Procedure Details  Location:  Shoulder R glenohumeral   Preparation: ethylchloride used.  Needle size: 22 G    Approach: posterior  Patient tolerance: patient tolerated the procedure well with no immediate complications    Medications administered: 1 mL Betamethasone 6 (3-3) MG/ML; 4 mL lidocaine 1 %    Patient Education: Cortisone Flare Risk Discussed        Orders Placed This Encounter   . Ambulatory referral to Physical Therapy   . BP Patient Education      No follow-ups on file.     Cleta Alberts, MD

## 2022-12-29 NOTE — Other (Signed)
PATIENT DRIVING I WILL CALL BACK TO SCHEDULE PAT APPT

## 2023-01-11 ENCOUNTER — Inpatient Hospital Stay: Admit: 2023-01-11 | Payer: MEDICARE

## 2023-01-11 DIAGNOSIS — Z01818 Encounter for other preprocedural examination: Secondary | ICD-10-CM

## 2023-01-11 DIAGNOSIS — Z01811 Encounter for preprocedural respiratory examination: Secondary | ICD-10-CM

## 2023-01-11 LAB — CBC WITH AUTO DIFFERENTIAL
Basophils %: 1 % (ref 0–1)
Basophils Absolute: 0 10*3/uL (ref 0.0–0.1)
Eosinophils %: 1 % (ref 0–7)
Eosinophils Absolute: 0.1 10*3/uL (ref 0.0–0.4)
Hematocrit: 37.5 % (ref 35.0–47.0)
Hemoglobin: 12.1 g/dL (ref 11.5–16.0)
Immature Granulocytes %: 1 % — ABNORMAL HIGH (ref 0.0–0.5)
Immature Granulocytes Absolute: 0 10*3/uL (ref 0.00–0.04)
Lymphocytes %: 19 % (ref 12–49)
Lymphocytes Absolute: 1.2 10*3/uL (ref 0.8–3.5)
MCH: 29.7 PG (ref 26.0–34.0)
MCHC: 32.3 g/dL (ref 30.0–36.5)
MCV: 91.9 FL (ref 80.0–99.0)
MPV: 10 FL (ref 8.9–12.9)
Monocytes %: 9 % (ref 5–13)
Monocytes Absolute: 0.6 10*3/uL (ref 0.0–1.0)
Neutrophils %: 69 % (ref 32–75)
Neutrophils Absolute: 4.5 10*3/uL (ref 1.8–8.0)
Nucleated RBCs: 0 PER 100 WBC
Platelets: 332 10*3/uL (ref 150–400)
RBC: 4.08 M/uL (ref 3.80–5.20)
RDW: 13.1 % (ref 11.5–14.5)
WBC: 6.5 10*3/uL (ref 3.6–11.0)
nRBC: 0 10*3/uL (ref 0.00–0.01)

## 2023-01-11 NOTE — Other (Addendum)
PATIENT GIVEN WRITTEN INSTRUCTIONS TO GO TO THE IMAGING CENTER/CANCER CENTER [phone] TO HAVE CHEST XRAY COMPLETED.     PER ANESTHESIA PROTOCOL, CXR ORDERED FOR PAT.  PT HAS BRONCHIECTASIS AND HAS NOT HAD A CXR "IN A WHILE".  PT SEES PULMONARY, DR. HEY/PAR/[phone] AND HAS AN APPT TOMORROW, 01/12/23 AT 0945AM.  MSG LEFT FOR NURSE TO CALL AFTER PT'S APPT TO OBTAIN NOTES FROM THIS VISIT.    CALLED TO PULMONARY/DR. SVX/[phone] AND REQ ANY RECENT NOTES FROM PAST APPT TO BE FAXED.  NOTES REC'D FROM APPT  ON 12/08/22 AND PLACED ON CHART.

## 2023-01-11 NOTE — Other (Signed)
[facility]  PREOPERATIVE INSTRUCTIONS    Surgery Date:   01/31/23    Your surgeon's office or [facility] staff will call you between 4 PM- 8 PM the day before surgery with your arrival time. If your surgery is on a Monday, you will receive a call the preceding Friday. If your surgeon's office has given you, your arrival time then go by that time.    Please report to [facility] Patient Access/Admitting on the 1st floor.  Bring your insurance card, photo identification, and any copayment ( if applicable).   If you are going home the same day of your surgery, you must have a responsible adult to drive you home. You need to have a responsible adult to stay with you the first 24 hours after surgery and you should not drive a car for 24 hours following your surgery.  If you are being admitted to the hospital, please leave personal belongings/luggage in your car until you have an assigned hospital room number.  Do NOT drink alcohol or smoke 24 hours before surgery. STOP smoking for 14 days prior as it helps with breathing and healing after surgery.  Please wear comfortable clothes. Wear your glasses instead of contacts. We ask that all money, jewelry and valuables be left at home. Wear no make up, particularly mascara, the day of surgery.    All body piercings, rings, and jewelry need to be removed and left at home. Remove all nail polish except for clear. Please wear your hair loose or down, no pony-tails, buns, or any metal hair accessories. You may wear deodorant, unless having breast surgery.  Do not shave any body area within 24 hours of your surgery.  Please follow all instructions to avoid any potential surgical cancellation.  Should your physical condition change, (i.e. fever, cold, flu, etc.) please notify your surgeon as soon as possible.  It is important to be on time. If a situation occurs where you may be delayed, please call:  [phone] / [phone] on the day of surgery.  The Preadmission  Testing staff can be reached at [phone].  Special instructions: FOLLOW ANY INSTRUCTIONS GIVEN TO YOU BY YOUR SURGEON      Eating and Drinking Before Surgery    You may eat a regular dinner at the usual time on the day before your surgery.  Do NOT eat any solid foods after midnight.  You may drink clear liquids only from 12 midnight until 1 hours prior to your arrival time at the hospital on the day of your surgery. Do NOT drink alcohol.  Clear liquids include:  Water  Fruit juices without pulp( i.e. apple juice)  Carbonated beverages  Black coffee (no cream/milk)  Tea (no cream/milk)  Gatorade  You may drink up to 12-16 ounces at one time every 4 hours between the hours of midnight and 1 hour before your arrival time at the hospital. Example- if your arrival time at the hospital is 6am, you may drink 12-16 ounces of clear liquids no later than 5am.  If you have any questions, please contact your surgeon's office.    Current Outpatient Medications   Medication Sig    levalbuterol (XOPENEX) 1.25 MG/3ML nebulizer solution Take 3 mLs by nebulization in the morning and at bedtime    NONFORMULARY Apply topically as needed (PAIN TO RT HIP) Ketoprofen 10% PLO GEL    Compounded at [employer] to neck and shoulders  docusate sodium (COLACE) 100 MG capsule Take 1 capsule by mouth 2 times daily as needed for Constipation    Rimegepant Sulfate (NURTEC) 75 MG TBDP Take 1 tablet by mouth as needed (MIGRAINES) As directed    fluticasone (FLONASE) 50 MCG/ACT nasal spray 1 spray by Each Nostril route as needed    Mastectomy Bra MISC by Does not apply route Mastectomy bras and prosthesis    acetaminophen (TYLENOL) 500 MG tablet Take 1 tablet by mouth every 6 hours as needed for Pain    albuterol sulfate HFA (PROVENTIL;VENTOLIN;PROAIR) 108 (90 Base) MCG/ACT inhaler Inhale 2 puffs into the lungs every 4 hours as needed for Wheezing or Shortness of Breath    DULoxetine (CYMBALTA) 30 MG extended release capsule Take 1  capsule by mouth 2 times daily    fluticasone furoate-vilanterol (BREO ELLIPTA) 100-25 MCG/ACT inhaler Inhale 1 puff into the lungs nightly    oxyCODONE (ROXICODONE) 5 MG immediate release tablet Take 1 tablet by mouth every 6 hours as needed for Pain.    sodium chloride, Inhalant, (HYPER-SAL) 7 % nebulizer solution Take 4 mLs by nebulization in the morning and at bedtime FOR USE AFTER XOPENEX NEBULIZER    zolpidem (AMBIEN) 10 MG tablet Take 1 tablet by mouth nightly.     No current facility-administered medications for this encounter.        YOU MUST ONLY TAKE THESE MEDICATIONS THE MORNING OF SURGERY  DULOXETINE, LEVALBUTEROL NEBULIZER    MEDICATIONS TO TAKE THE MORNING OF SURGERY ONLY IF NEEDED: FLONASE, ALBUTEROL, OXYCODONE (MUST TAKE 4HRS PRIOR TO ARRIVAL AT HOSPITAL)    HOLD these prescription medications BEFORE Surgery: NONE    Ask your surgeon/prescribing physician about when/if to STOP taking these medications: NONE.  (If you are currently taking Plavix, Coumadin,or any other blood-thinning/anticoagulant medication contact your prescribing physician for instructions).    Stop all vitamins, herbal medicines and Aspirin containing products 7 days prior to surgery. Stop any non-steroidal anti-inflammatory drugs (i.e. Ibuprofen, Naproxen, Advil, Aleve) 3 days before surgery. You may take Tylenol.              Preventing Infections Before and After - Your Surgery    IMPORTANT INSTRUCTIONS    You play an important role in your health and preparation for surgery. To reduce the germs on your skin you will need to shower with CHG soap (Chorhexidine gluconate 4%) two times before surgery.    CHG soap (Hibiclens, Hex-A-Clens or store brand)  CHG soap will be provided at your Preadmission Testing (PAT) appointment.  If you do not have a PAT appointment before surgery, you may arrange to pick up CHG soap from our office or purchase CHG soap at a pharmacy, grocery or department store.  You need to purchase TWO 4 ounce  bottles to use for your 2 showers.    Steps to follow:  Wash your hair with your normal shampoo and your body with regular soap and rinse well to remove shampoo and soap from your skin.  Wet a clean washcloth and turn off the shower.  Put CHG soap on washcloth and apply to your entire body from the neck down. Do not use on your head, face or private parts(genitals). Do not use CHG soap on open sores, wounds or areas of skin irritation.  Wash you body gently for 5 minutes. Do not wash your skin too hard. This soap does not create lather. Pay special attention to your underarms and from your belly button to your  feet.  Turn the shower back on and rinse well to get CHG soap off your body.  Pat your skin dry with a clean, dry towel. Do not apply lotions or moisturizer.  Put on clean clothes and sleep on fresh bed sheets and do not allow pets to sleep with you.    Shower with CHG soap 2 times before your surgery  The evening before your surgery  The morning of your surgery      Tips to help prevent infections after your surgery:  Protect your surgical wound from germs:  Hand washing is the most important thing you and your caregivers can do to prevent infections.  Keep your bandage clean and dry!  Do not touch your surgical wound.  Use clean, freshly washed towels and washcloths every time you shower; do not share bath linens with others.  Until your surgical wound is healed, wear clothing and sleep on bed linens each day that are clean and freshly washed.  Do not allow pets to sleep in your bed with you or touch your surgical wound.  Do not smoke - smoking delays wound healing. This may be a good time to stop smoking.  If you have diabetes, it is important for you to manage your blood sugar levels properly before your surgery as well as after your surgery. Poorly managed blood sugar levels slow down wound healing and prevent you from healing completely.        Day of Procedure    Please park in the parking deck or any  designated visitor parking lot.  Enter the facility through the Main Entrance of the hospital.  On the day of surgery, please provide the cell phone number of the person who will be waiting for you to the Patient Access representative at the time of registration.  Masks are highly recommended in the hospital, but not required.  Once your procedure and the immediate recovery period is completed, a nurse in the recovery area will contact your designated visitor to inform them of your room number or to otherwise review other pertinent information regarding your care.    Social distancing practices are strongly encouraged in waiting areas and the cafeteria.       The patient was contacted in person.   She verbalized understanding of all instructions does not need reinforcement.

## 2023-01-11 NOTE — Other (Addendum)
CALLED PAR TO REQUEST PULM NOTES FROM 01/12/2023    1400 NOTES RECEIVED FROM PAR AND PLACED ON CHART.

## 2023-01-12 NOTE — Unmapped (Signed)
Visit Number: 2    Subjective     Patient preferred name: Emily [name]  Referring Provider: Dan Humphreys  Medical Diagnosis: Left shoulder pain, unspecified chronicity  PT Diagnosis: neck and left shoulder pain and stiffness  Onset date: acute on chronic    Chief Complaint: neck and shoulder pain and stiffness     History of Present Illness: Emily [name] is a 72 y.o. year old RHD female referred today to address neck and left shoulder pain. Describes a chronic history of neck pain made worse after treatments for breast cancer.   [place] 2014 tx with mastectomy, lymph node dissection, chemo and radiation. Left UE lymphadema and hx of cellulitis.     04/11: Having some pain down the back of left upper arm this morning.     Objective     Passive flexion 130 degree  Treatments    Therapeutic Activity (97530) for dynamic activities, functional performance, power or agility  -20 minutes direct 1-1; 10 minutes in direct     -supine chin tucks 3"x 20     -** standing reverse shoulder rolls 20x     -** UT and LS stretches 10x, 5" each     -** NEW standing "no money" 2 x 10     -** NEW red tb rows 3 x 10     -pt ed HEP Frequency and duration     -pt ed medical dx and PT intervention     -** supine open books no band 2 x 10     -** NEW bent elbow supine backstroke 2 x 8       Manual Therapy (97140) for joint mobilization/manipulation, soft tissue mobilization  -20 min direct 1-1     -PROM right UE (slowly, arm elevated)     -STM right pec maj/min       Comments:  Access Code: [email or web address]  URL: [email or web address]  Date: 01/09/2023  Prepared by: Kassie Mends    Exercises  - Seated Gentle Upper Trapezius Stretch  - 2 x daily - 7 x weekly - 3 sets - 30 hold  - Gentle Levator Scapulae Stretch  - 2 x daily - 7 x weekly - 3 sets - 30 hold  - Supine Chin Tuck  - 1 x daily - 7 x weekly - 2 sets - 10 reps - 3 hold  - Hooklying Shoulder T  - 1 x daily - 7 x weekly - 2 sets - 8 reps  - Seated Shoulder Circles with Compression  Garment  - 1 x daily - 7 x weekly - 2 sets - 10 reps     The therapy interventions in this note were performed separately and at distinct intervals of time.    Assessment   Skilled physical therapy required to progress pain-free range of motion left shoulder and cervical spine.  Passive range of motion left upper extremity performed gently and with arm elevated taking into account lymphedema.  Patient tolerated well.    Plan     Continue to progress posture re-education, functional strengthening and range of motion as tolerated.

## 2023-01-13 ENCOUNTER — Inpatient Hospital Stay: Payer: MEDICARE

## 2023-01-13 NOTE — Telephone Encounter (Signed)
Referral rec'd from team to provide psychosocial support for the patient. Called the patient this morning and introduced self and role. Pt explained that she would like to discuss PET scan with Dr. Maxwell Marion before going to surgery for her breast concern due to a recent diagnosis of MAC of the lung. Pt states this is her primary concern and is doing well otherwise. She was appreciative for the phone call.       Emily [name], MSW  [facility] Social Worker    [facility]  [facility], [facility], & [facility]  W: [phone]  [email or web address] Help to Those in Need

## 2023-01-13 NOTE — Telephone Encounter (Signed)
Patient called and left a message requesting a call back from Dr. Maxwell Marion.     When I spoke to her, she was tearful and overwhelmed.  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  Her pulmonologist would like to start her on a 12 month regimen of four antibiotics, but they will have to wait 3 months due to surgery.  She is very distraught about this and says that to her the lung issue is a much greater priority than her breast. She wants Dr. Maxwell Marion to call her back and discuss if there is an alternate path we can take with her breast so that she can start the antibiotics regimen sooner.  She was specifically interested in getting a PET scan.     I did make sure she was aware that Dr. Maxwell Marion is not here today, and is in the OR the first part of next week.  The patient said she was already aware that he is not going to be in the office until Wednesday and she is okay with that.

## 2023-01-13 NOTE — Other (Signed)
PAT APPT 01/11/23 @ 1330

## 2023-01-17 ENCOUNTER — Ambulatory Visit: Payer: MEDICARE | Attending: Specialist

## 2023-01-17 NOTE — Progress Notes (Deleted)
History of VIN 3.

## 2023-01-17 NOTE — Telephone Encounter (Signed)
Called no answer

## 2023-01-17 NOTE — Progress Notes (Unsigned)
[facility] GYNECOLOGIC ONCOLOGY  [address]  [place], [place] [address]  P [phone]  F [phone]    Office Note  Patient ID:  Name:  Emily [name]  MRN:  [id number]  DOB:  1951-01-20/72 y.o.  Date:  01/17/2023      HISTORY OF PRESENT ILLNESS:  Emily [name] is a 72 y.o. Caucasian postmenopausal female who is an established patient with a history of vulvar dysplasia, though I haven't seen her in the office since July 2020.  She had come to me in June 2019 for a second opinion regarding management of her dysplasia.  She was a patient of Dr. Cephus Richer.  At that time two separate biopsies of the right labia minora revealed high-grade squamous intraepithelial lesion, severe vulvar dysplasia, or VIN 3.  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  He asked that I see her for consultation.      I discussed the pathology from the biopsy with her and explained the need for surgical excision of the lesion.  Since it was a precancerous lesion, the resection margins did not need to be radical.  A wide local excision or simple partial vulvectomy should be all that's required.  I recommended excising the skin tag along with the lesion, as they were immediately adjacent to one another.  She was taken to the OR on 05/18/18 for simple partial vulvectomy.     Operative findings: Small, flat, plaque on the right upper vulva, just lateral to the fold of the labia minora.  Small, raised, "skin tag" just above and lateral.  Both were excised completely and I was able to salvage most of the labia minora.      FINAL PATHOLOGIC DIAGNOSIS   Right upper vulva, partial vulvectomy:   Vulvar intraepithelial neoplasia, grade 3 of 3 (VIN III) focally extending to 3:00 and 9:00 peripheral skin margins   Remainder of skin margins and deep margin, no evidence of dysplasia   Incidental melanocytic nevus, intradermal type      She did have microscopically positive margins at 3 o'clock and 9 o'clock, though  clinically the margins were widely negative.  I did not recommend any addition excision at this time, but I explained to her that she was at risk for recurrence.  I stressed to her the importance of keeping an eye on the area to call if she saw any changes in the area.     She presents today for follow-up.  She is without complaints.  She denies any new lesions.  ***        ROS:  GU and GI review:  Negative  Cardiopulmonary review:  Negative   Musculoskeletal:  Negative    A comprehensive review of systems was negative except for that written in the History of Present Illness., 10 point ROS      Problem List:  Patient Active Problem List    Diagnosis Date Noted    Cellulitis 10/23/2022    HX: breast cancer 10/20/2022    Breast mass, left 10/20/2022    Personal history of malignant neoplasm of breast 10/20/2022    On antineoplastic chemotherapy 02/01/2022    Cellulitis of left arm 11/23/2018    VIN III (vulvar intraepithelial neoplasia III) 03/22/2018    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    Lymphedema of left arm 08/21/2015    Pheochromocytoma 08/21/2015    Arthritis of both knees 08/21/2015  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) 07/24/2015    Breast cancer, stage 2 (HCC) 10/22/2012     PMH:  Past Medical History:   Diagnosis Date    Anxiety     Arthritis     Basal cell carcinoma     TO LEGS, LT ARM    Bronchiectasis (HCC) 08/2020    Cancer (HCC)     left breast  cancer    Chronic tension headaches     Concussion 2011    Headache(784.0)     OCCAS    Hx of radiation therapy     COMPLETED IN 2014    Ill-defined condition     cellulitis L UE    Lymphedema of arm     left    Nausea & vomiting     Other and unspecified symptoms and signs involving general sensations and perceptions     HIVES RECENTLY- WAS IN ER Feb 19, 2013    Other ill-defined conditions(799.89)     PHEOCHROMOCYTOMA    S/P chemotherapy, time since greater than 12 weeks     COMPLETED CHEMOTHERAPY IN  JUNE 2014    SCCA (squamous cell carcinoma) of skin       PSH:  Past Surgical History:   Procedure Laterality Date    APPENDECTOMY      BREAST RECONSTRUCTION  11/28/2012    BREAST RECONSTRUCTION /C  INSERTION EXPANDER & ALLODERM performed by Ivar Bury, MD at [facility] AMBULATORY OR    BREAST RECONSTRUCTION Bilateral 09/16/2013    REMOVAL AND REPLACEMENT LEFT BREAST IMPLANT TISSUE EXPANDERS WITH SUBTOTAL CAPSULECTOMY, RIGHT BREAST IMPLANT REMOVAL AND REPLACEMENT FOR SYMMETRY/FAT GRAFTING TO BILATERAL BREASTS performed by Ivar Bury, MD at [facility] MAIN OR    BREAST SURGERY  '91    breast implants; replaced bilat implants 2011    BREAST SURGERY Left 04/22/2014    REMOVAL OF LEFT BREAST IMPLANT, WASHOUT AND CLOSURE OF LEFT BREAST WOUND performed by Ivar Bury, MD at [facility] MAIN OR    COLONOSCOPY      ENDOSCOPY, COLON, DIAGNOSTIC      GYN      c-section    HEENT      right adrenal removed    HEENT      T&A    MASTECTOMY Left     MASTECTOMY, MODIFIED RADICAL  11/28/2012    LEFT BREAST MODIFIED RADICAL MASTECTOMY WITH AXILLARY DISSECTION W/ RECONSTRUCTION LEFT BREAST WITH TISSUE EXPANDER AND ALLODERM;PORTACATH INSERTION performed by Binnie Rail, MD at [facility] AMBUL[facility]ORY OR    ORTHOPEDIC SURGERY      bilateral shoulder arthroscopy    OVARY REMOVAL      right    PR UNLISTED PROCEDURE ABDOMEN PERITONEUM & OMENTUM      right adenal removed    PR UNLISTED PROCEDURE ABDOMEN PERITONEUM & OMENTUM      hernia mesh repair    TONSILLECTOMY      VASCULAR SURGERY      PORT, THEN REMOVED      Social History:  Social History     Tobacco Use    Smoking status: Never    Smokeless tobacco: Never   Substance Use Topics    Alcohol use: No      Family History:  Family History   Problem Relation Age of Onset    Alzheimer's Disease Mother     Diabetes Mother     Other Mother  COMBATIVE POST ANESTHESIA    Elevated Lipids Mother     Diabetes Father     Heart Disease Father     Lung Disease Father         COPD    Elevated Lipids Father      Headache Sister     Migraines Sister     Anesth Problems Neg Hx       Medications: (reviewed)  Current Outpatient Medications   Medication Sig    levalbuterol (XOPENEX) 1.25 MG/3ML nebulizer solution Take 3 mLs by nebulization in the morning and at bedtime    NONFORMULARY Apply topically as needed (PAIN TO RT HIP) Ketoprofen 10% PLO GEL    Compounded at [employer]    Applies to neck and shoulders    docusate sodium (COLACE) 100 MG capsule Take 1 capsule by mouth 2 times daily as needed for Constipation    Rimegepant Sulfate (NURTEC) 75 MG TBDP Take 1 tablet by mouth as needed (MIGRAINES) As directed    fluticasone (FLONASE) 50 MCG/ACT nasal spray 1 spray by Each Nostril route as needed    Mastectomy Bra MISC by Does not apply route Mastectomy bras and prosthesis    acetaminophen (TYLENOL) 500 MG tablet Take 1 tablet by mouth every 6 hours as needed for Pain    albuterol sulfate HFA (PROVENTIL;VENTOLIN;PROAIR) 108 (90 Base) MCG/ACT inhaler Inhale 2 puffs into the lungs every 4 hours as needed for Wheezing or Shortness of Breath    DULoxetine (CYMBALTA) 30 MG extended release capsule Take 1 capsule by mouth 2 times daily    fluticasone furoate-vilanterol (BREO ELLIPTA) 100-25 MCG/ACT inhaler Inhale 1 puff into the lungs nightly    oxyCODONE (ROXICODONE) 5 MG immediate release tablet Take 1 tablet by mouth every 6 hours as needed for Pain.    sodium chloride, Inhalant, (HYPER-SAL) 7 % nebulizer solution Take 4 mLs by nebulization in the morning and at bedtime FOR USE AFTER XOPENEX NEBULIZER    zolpidem (AMBIEN) 10 MG tablet Take 1 tablet by mouth nightly.     No current facility-administered medications for this visit.     Allergies: (reviewed)  Allergies   Allergen Reactions    Bacitracin Hives and Swelling    Nsaids Swelling     Throat swells    Aspirin Palpitations and Swelling     Other reaction(s): Unknown (comments)  Gi bleeding  Other reaction(s): Other (see comments), Rapid pulse  Other reaction(s): Unknown  (comments)  Gi bleeding    Prochlorperazine Rash          OBJECTIVE:    Physical Exam:  VITAL SIGNS: There were no vitals filed for this visit.  There is no height or weight on file to calculate BMI.   GENERAL APP: Conversant, alert, oriented. No acute distress.   HEENT: HEENT. No thyroid enlargement. No JVD.   Neck: Supple without restrictions.   RESPIRATORY: Clear to auscultation and percussion to the bases. No CVAT.   CARDIOVASC: RRR without murmur/rub.   GASTROINT: soft, non-tender, without masses or organomegaly   MUSCULOSKEL: no joint tenderness, deformity or swelling   EXTREMITIES: extremities normal, atraumatic, no cyanosis or edema   PELVIC: ***   RECTAL: Deferred   NODAL SURVEY: No suspicious lymphadenopathy or edema noted.   NEURO: Grossly intact. No acute deficit.       Lab Data:    Lab Results   Component Value Date/Time    WBC 6.5 01/11/2023 02:24 PM    HGB 12.1 01/11/2023  02:24 PM    HCT 37.5 01/11/2023 02:24 PM    PLT 332 01/11/2023 02:24 PM    MCV 91.9 01/11/2023 02:24 PM     Lab Results   Component Value Date/Time    NA 141 10/25/2022 04:44 AM    K 4.0 10/25/2022 04:44 AM    CL 111 10/25/2022 04:44 AM    CO2 25 10/25/2022 04:44 AM    BUN 7 10/25/2022 04:44 AM    GFRAA >60 06/14/2021 01:46 PM         Imaging:  None      IMPRESSION/PLAN:  TWANISHA FOULK is a 72 y.o. female with a history of VIN 3.  ***      An electronic signature was used to sign this note.    Annia Friendly, MD  01/17/23

## 2023-01-18 NOTE — Telephone Encounter (Signed)
Discussed with patient. Will cancel surgery scheduled for 4/30 and follow up in office in 2 months

## 2023-02-10 ENCOUNTER — Encounter

## 2023-02-10 ENCOUNTER — Inpatient Hospital Stay: Admit: 2023-02-10 | Payer: MEDICARE

## 2023-02-10 DIAGNOSIS — M25512 Pain in left shoulder: Secondary | ICD-10-CM

## 2023-02-16 ENCOUNTER — Encounter

## 2023-02-28 ENCOUNTER — Ambulatory Visit: Payer: MEDICARE

## 2023-02-28 ENCOUNTER — Inpatient Hospital Stay: Admit: 2023-02-28 | Payer: MEDICARE

## 2023-02-28 DIAGNOSIS — Z780 Asymptomatic menopausal state: Secondary | ICD-10-CM

## 2023-03-20 ENCOUNTER — Inpatient Hospital Stay: Admit: 2023-03-20 | Payer: Medicare Other

## 2023-03-20 DIAGNOSIS — I890 Lymphedema, not elsewhere classified: Secondary | ICD-10-CM

## 2023-03-20 NOTE — Other (Signed)
[facility]  a part of [facility]   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   8-22 min = 1 unit; 23-37 min = 2 units; 38-52 min = 3 units;  53-67 min = 4 units; 68-82 min = 5 units         SUBJECTIVE  Pain Level (0-10 scale): 0 out of 10 numeric scale currently posterior arm    [x] constant [] intermittent [] improving [] worsening [] no change since onset    Any medication changes, allergies to medications, adverse drug reactions, diagnosis change, or new procedure performed?: []  No    [x]  Yes (see summary sheet for update)  Medications: Verified on Patient Summary List  Reduced dosages for meds with a lot now PRN, symbalta daily    Subjective functional status/changes:      I have a lot of osteoarthritis all over and they just worked me up.  I definitely have it in my left shoulder.  I am working on the [employer]. Patient discussed with her medical team and has decided not to pursue biopsy on site noted on left breast  due to last time it was done it took a year to heal.  Patient reports she is using white petroleum as a barrier to help  prevent cellulitis.      Start of Care: 03/20/2023  Onset Date: 11/28/2018   Current symptoms/Complaints: swelling in left arm and chest region, pain and tightness in left arm, chest and armpit, unable to reach overhead, unable to lift heavier objects with left arm due to it makes the swelling worse. Patient reports she has some skin cancer that needs to be removed but is planning to do in fall.  Mechanism of Injury:     Patient presents with chronic left upper extremity lymphedema s/p left breast mastectomy with history of radiation and breast reconstruction in 2013 and 2014.  Patient has a history of recurrent cellulitis  infections with last infection reported by patient to be in February of 2024 with 5 day hospitalization for cellulitis.  She was wearing compression consistently and has day and night compression products.  She also has a Nature conservation officer that she is using consistently every other day.  Patient is concerned about recurrent infections and trying to prevent them in the future.  Patient also reports significant chest and shoulder tightness with a history of axillary web syndrome.    Patient is requesting to replace her day and night compression garments currently.  She brought in the new elvarex sleeve that was ordered in late 2023 but it has never been fitted or used since delivery.  PLOF: : Independent gait without any assistive device for community distances, modified independent to with self care and functional activities due to increased arm size and swelling, activities require increased time, unable to reach overhead, clothing fit is an issue for the left arm  Limitations to PLOF/Activity or Recreational Limitations: unable to play catch with son, can't play tennis with son any longer secondary to pain, lymphedema and decreased mobility in left arm, can only exercise from waist down with activities such as walking  Work Hx:  Retired  Living Situation: Patient is married and has moved back to [place]  from [place].     Has an adopted 98 year old son named [name].  Husband is a retired Public affairs consultant: Independent gait without any assistive device for community distances  Self Care: modified independent with self care and functional activities with increased time and effort, unable to reach overhead with left arm due to left shoulder  Previous Treatment/Compliance: wears compression garments daily, night garments nightly, Flexitouch  every other day, exercise in the form of walking and elevation  PMHx/Surgical Hx/Comorbidites:   Past Medical History:   Diagnosis Date    Anxiety     Arthritis     Basal cell carcinoma     TO LEGS, LT ARM    Bronchiectasis (HCC) 08/2020    Cancer (HCC)     left breast  cancer    Chronic tension headaches     Concussion 2011    Headache(784.0)     OCCAS    Hx of radiation therapy     COMPLETED IN 2014    Ill-defined condition     cellulitis L UE    Lymphedema of arm     left    Nausea & vomiting     Other and unspecified symptoms and signs involving general sensations and perceptions     HIVES RECENTLY- WAS IN ER Feb 19, 2013    Other ill-defined conditions(799.89)     PHEOCHROMOCYTOMA    S/P chemotherapy, time since greater than 12 weeks     COMPLETED CHEMOTHERAPY IN JUNE 2014    SCCA (squamous cell carcinoma) of skin       Past Surgical History:   Procedure Laterality Date    APPENDECTOMY      BREAST RECONSTRUCTION  11/28/2012    BREAST RECONSTRUCTION /C  INSERTION EXPANDER & ALLODERM performed by Ivar Bury, MD at [facility] AMBULATORY OR    BREAST RECONSTRUCTION Bilateral 09/16/2013    REMOVAL AND REPLACEMENT LEFT BREAST IMPLANT TISSUE EXPANDERS WITH SUBTOTAL CAPSULECTOMY, RIGHT BREAST IMPLANT REMOVAL AND REPLACEMENT FOR SYMMETRY/FAT GRAFTING TO BILATERAL BREASTS performed by Ivar Bury, MD at [facility] MAIN OR    BREAST SURGERY  '91    breast implants; replaced bilat implants 2011    BREAST SURGERY  Left 04/22/2014    REMOVAL OF LEFT BREAST IMPLANT, WASHOUT AND CLOSURE OF LEFT BREAST  WOUND performed by Ivar Bury, MD at [facility] MAIN OR    COLONOSCOPY      ENDOSCOPY, COLON, DIAGNOSTIC      GYN      c-section    HEENT      right adrenal removed    HEENT      T&A    MASTECTOMY Left     MASTECTOMY, MODIFIED RADICAL  11/28/2012    LEFT BREAST MODIFIED RADICAL MASTECTOMY WITH AXILLARY DISSECTION W/ RECONSTRUCTION LEFT BREAST WITH TISSUE EXPANDER AND ALLODERM;PORTACATH INSERTION performed by Binnie Rail, MD at [facility] AMBULATORY OR    ORTHOPEDIC SURGERY      bilateral shoulder arthroscopy    OVARY REMOVAL      right    PR UNLISTED PROCEDURE ABDOMEN PERITONEUM & OMENTUM      right adenal removed    PR UNLISTED PROCEDURE ABDOMEN PERITONEUM & OMENTUM      hernia mesh repair    TONSILLECTOMY      VASCULAR SURGERY      PORT, THEN REMOVED      Prior Hospitalization:  Hospitalized in February for 5 days for cellulitis  Barriers: [] pain [] Financial [] time [] transportation [x] Other: none noted  Substance use: [] Alcohol [] Tobacco [x] other: none noted  Pt Goals: less swelling, better circulation and mobility  Motivation: Patient is motivated to continue to manage swelling  Cognition: A & O x 4          Objective/Functional Outcome Measure: FOTO scores  48    OBJECTIVE    Current or Previous Compression Garment(s):  Juxo expert silver class 1 custom flat knit compression sleeve with silicone band, Juzo expert custom class 1 compression sleeve with silicone,  hand pieces including gloves and gauntlets, Solaris Tribute custom arm garment ordered in 2022,  Jobst Relax night compression garment                       Compression Pump:  Flexitouch home vasopneumatic device      Skin Integrity and Tissue Assessment  Dermal Status: Intact and Scars  Texture/Consistency:  Boggy  forearm   Sensation:  intact to light touch  Pigmentation/Color Change: Normal  Circulatory: Vascular studies ruled out DVT in past   Nails: Normal  Stemmers Sign: negative    Height: 5'6"  Weight: 124lbs  BMI:  20.1  (36 or greater: adversely  affecting lymphedema)    AROM: History of bilateral rotator cuff surgery  L Shoulder (degrees) R Shoulder (degrees)   Flexion:  0-85 Flexion: 0-120   Extension: 0-45 Extension: 0-40   Internal rotation: 0-40 Internal rotation: 0-60   External rotation: 0-50 External rotation: 0-7   Abduction: 0-70 pain  Abduction: 0-105 pain       Volumetric Measurements:   Right:  1,733.38 mL (increased by  130.65 ml since last assessment 09/29/2022 ) Left:  2,079.9mL (decreased by 6.79 ml since last assessment 09/29/2022)   % Difference:  22.24% left larger than right versus 30.17% left larger right on last assessment 09/29/2022 Dominance: right           22 min [x] Eval - untimed                        Therapeutic Procedures:  Tx Min Billable or 1:1 Min (if diff from Tx Min) Procedure, Rationale, Specifics   8 8  [address] Self Care/Home Management (timed):  improve patient knowledge and understanding of pain reducing techniques, positioning, physical therapy expectations, procedures and progression, and skin care  to improve patient's ability to progress to PLOF and address remaining functional goals.  (see flow sheet as applicable)    Details if applicable:  Discussed vasopneumatic device which patient is using consistently.    Skin/wound care/debridement:   Reviewed skin care principles:    Low pH lotion  Application following MLD principles  Prevention of cellulitis     Reviewed skin care with patient and discussed use of white petroleum which will damage daytime compression sleeve.  Educated patient in the importance of good skin care and she may want to perform skin care at night before bedtime rather than in the am before donning her elvarex sleeve.  Also discussed that she may want to mix the petroleum with her Cerave lotion to lessen the amount of white petroleum that can build up on her night compression garment.           21 21 [address]7760 Orthotic Management and Training UE, LE and/or trunk, initial orthotic(s) encounter  (timed): assessment and fitting to improve positioning of lower extremity during weight bearing and gait, improve pressure distribution of the plantar aspect of the foot, improve upper extremity performance, improve postural stability to improve patient's ability to progress to PLOF and address remaining functional goals.    Details if applicable:    Education: Educated patient in compression garment donning and doffing., Glove use with donning., Daily wear schedule., Daily laundering., Garment lifespan., and Return and reordering process (by bringing prior garments into the clinic).    Patient assisted with donning of new elvarex sleeve.  Educated in laundering and wearing scheduled.  Cautioned patient to monitor for hand swelling with sleeve wear and to remove the new sleeve if that occurs.  This garment was delivered in January and is not eligible for remake due to the length of time between order and fitting.  Garment fits well today but may benefit from some new circumferences for reorder.  Recommended patient order a glove with future orders as she does report some episodes of hand swelling.    Educated patient in Medicare coverage changes for compression garments and current status of coverage and ordering.  Educated patient in ordering process, authorization process and documentation required, and that current Medicare recommendations are not a guarantee of coverage.     Discussed and demonstrated night compression garment options with patient preference for Tribute garment.  Patient's old (over 4 years old) garment was assessed and it could not be fully donned today and patient had trouble gripping the garment material.  Demonstrated the low profile Solaris Tribute and patient feels like this material is easier to manage.      51 29    Total Total     Modalities Rationale:   To  prevent worsening of swelling over time to improve the patient's ability to perform ADL and IADL skills        37    Compression  garment measuring and fitting     (Unbilled Time) Upper/Lower Extremity Compression: Measured for the following compression products:     UE: Left Glove and Arm Sleeve, night garment     Style: Flat knit and Foam based     Brand: Elvarex and Solaris     Type: Custom: elvarex sleeve compression class 2, with silicone band and elbow comfort, custom elvarex glove compression class 1, custom  solaris low profile tribute tip of fingers to axilla with outer jacket     Vendor: [employer]     Daytime compression:  Patient requires custom flat-knit left upper extremity compression sleeve and glove at this time for optimal swelling/lymphedema control and garment fit.  Patient's limb size is disproportionate with proximal limb being disproportionate in size to distal limb.  A poorly fitting compression garment will not provide adequate compression for long term swelling control and risks causing a tourniquet or skin breakdown.   Flat-knit material is needed for swelling containment and to prevent progression of swelling during wear so patient does not swell out of the garment.  Circular knit compression is inadequate to contain patient's swelling.  Silicone band is needed for garment suspension to prevent garment from sliding down the arm and causing skin irritation and increased pressure at gathering point which would lead to skin breakdown or irritation. Patient requires elbow comfort to prevent skin irritation and breakdown at this location of increased friction due to frequent elbow flexion and extension throughout the day.         Patient requires 3 custom left upper extremity compression sleeves and 3 left hand compression gloves for washing and wearing and to allow her to maintain the recommended wearing schedule to optimally manage the lymphedema.          Nighttime garments:  Patient requires left UE  custom foam based night compression garments to prevent worsening of swelling over night and to provide optimal  compression  and fit.  There are not adequate non custom night compression garments to fit patient correctly and accommodate her limb size and shape.  The outer jacket is required to achieve appropriate compression and swelling containment. Patient requires low profile foam to allow her to grip garment material and don product without assistance.        Patient requires 2  custom left upper extremity night compression garments for washing and wearing and to allow her to maintain the recommended wearing schedule to optimally manage the lymphedema.          Patient is to call upon garment delivery to schedule a fitting.          Patient Education: [x]  Review HEP /home lymphedema management routine   [x]   Patient Education billed concurrently with other procedures   []  MLD Patient Education   []  Progressed/Changed HEP based on:   []  positioning   []  Kinesiotape   [x]  Skin care   []  wound care   [x]  other: compression garment ordering process, garment options, recommended fitting, garment lifespan  [x]   Medicare changes to compression garment coverage,   Patient / caregiver re-demonstrated bandaging. []  Yes  []  No  Compression bandaging/garment precautions reviewed: []  Yes  []  No      Pain Level at end of session (0-10 scale): 0 out of 10 numeric scale       Plan of Care / Statement of Necessity for Lymphedema Therapy Services     Assessment / key information:     AKIYAH DEGROSS is a 72 y.o. female who presents with  left upper extremity stage 2 lymphedema secondary to a history of left breast cancer with mastectomy and reconstruction.  Patient has a history of recurrent cellulitis infections with the last infection having been in February of 2024.  Patient also has a history of axillary web cording on the left side. Swelling is slightly decreased on assessment today and percentage difference between affected  and unaffected extremity as decreased. Her current day and night compression garments are due to be replaced.     Patient continues to benefit from complete decongestive therapy (CDT) including: Manual lymphatic drainage (MLD) technique, Short-stretch textile bandages/compression system to decongest limb and Kinesiotaping as appropriate.         Evaluation Complexity:  History:  HIGH Complexity :3+ comorbidities / personal factors will impact the outcome/ POC ; Examination:  MEDIUM Complexity : 3 Standardized tests and measures addressin body structure, function, activity limitation and / or participation in recreation  ;Presentation:  MEDIUM Complexity : Evolving with changing characteristics  ;Clinical Decision Making:  MEDIUM Complexity : FOTO score of 26-74 Overall Complexity Rating: MEDIUM  Problem List: pain affecting function, decrease ROM, and edema affection function   Treatment Plan may include any combination of the following: [address] Therapeutic Exercise, 97140 Manual Therapy, 97535 Self Care/Home Management, 97016 Vasopneumatic Device  (Vasopnuematic compression justification:  Per bilateral girth measures taken and listed above the edema is considered significant and having an impact on the patient's self care and ADL's), and 97760 Orthotic Management and Training  Patient / Family readiness to learn indicated by: asking questions, interest, and return verbalization   Persons(s) to be included in education: patient (P)  Barriers to Learning/Limitations: financial and other insurance does not currently cover the cost of compression products,    Measures taken if barriers to learning present: not applicable  Patient Self Reported Health Status: good  Rehabilitation Potential: good     Short Term Goals: To be accomplished in 4 treatments    1. Patient will be measured for and obtain comfortable and optimal fitting day and night compression products to prevent reaccumulation of fluid at discharge which could impair patient's ability to perform safe mobility and dressing.   2. Patient/caregiver will demonstrate improved  edema management as evidenced by performing donning/doffing of garments modified independent 3/3 times within session to aid in reducing risk for infection and promote transition to maintenance phase of CDT.      Frequency / Duration:  Patient to be seen 1 times in 4-10 weeks dependent on compression garment delivery timeframe followed by 1 visit every 2-3 weeks for 4 treatments.     Patient/ Caregiver education and instruction: Diagnosis, prognosis, self care and other compression garments  [x]   Plan of care has been reviewed with PTA      Certification Period: 03/20/2023 to 06/15/2023       Jodene Nam, MSPT, CLT-LANA      03/20/2023       4:04 PM        ===================================================================  I certify that the above Therapy Services are being furnished while the patient is under my care. I agree with the treatment plan and certify that this therapy is necessary.    Physician's Signature:_________________________   DATE:_________   TIME:________                           Valora Corporal, Timoteo Expose, *    ** Signature, Date and Time must be completed for valid certification **  Please sign and fax to [phone].  Thank you

## 2023-04-14 NOTE — Progress Notes (Signed)
Faxed signed SWO to [facility]. Confirmation received

## 2023-05-16 ENCOUNTER — Inpatient Hospital Stay: Admit: 2023-05-16 | Payer: MEDICARE

## 2023-05-16 NOTE — Progress Notes (Signed)
[facility]  a part of [facility]   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  They required some practice and education in correct donning procedures but were independent by the end of the session.  Will reorder products with [employer] once patient calls later in the week to confirm that everything is okay.  Patient is to call upon garment delivery if there are fit issues upon delivery.  Short term goal 2 is met.     Patient will continue to benefit from skilled PT / OT services to address swelling, analyze compression product fit and use, instruct in home lymphedema management program, and measure for compression products to address functional deficits and attain remaining goals    CURRENT STATUS/GOALS  1.Patient will be measured for and obtain comfortable and optimal fitting day and night compression products to prevent reaccumulation of fluid at discharge which could impair patient's ability to perform safe mobility and dressing.   Status at last Eval/Progress Note: initiated  Current Status: in progress  Goal Met?  no    2.  Patient/caregiver will demonstrate improved edema management as evidenced by performing donning/doffing of garments modified independent 3/3 times within session to aid in reducing risk for infection and promote transition to maintenance phase of CDT.    Status at last Eval/Progress Note: Initiated  Current Status: met  Goal Met?  yes         RECOMMENDATIONS FOR SKILLED  THERAPY  Continue with plan of care as established on evaluation.         Jodene Nam, MSPT, CLT-LANA       05/16/2023       11:44 AM    If you have any questions/comments please contact us directly at [phone].   Thank you for allowing [place] to assist in the care of your patient.

## 2023-05-16 NOTE — Progress Notes (Addendum)
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   8-22 min = 1 unit; 23-37 min = 2 units; 38-52 min = 3 units;  53-67 min = 4 units; 68-82 min = 5 units         SUBJECTIVE    Pain Level (0-10 scale): 0 out of 10 numeric scale    Any medication changes, allergies to medications, adverse drug reactions, diagnosis change, or new procedure performed?: [x]  No    []  Yes (see summary sheet for update)  Medications: Verified on Patient Summary List    Subjective functional status/changes:     I am going to have 2 skin cancers removed 06/14/2023 and they are on this left forearm.  What do you think I should do about wearing compression on my arm? Patient's son Emily [name] attended session with her today.       OBJECTIVE      Therapeutic Procedures:  Tx Min Billable or 1:1 Min (if diff from Tx Min) Procedure, Rationale, Specifics   [phone] Self Care/Home Management (timed):  improve patient knowledge and understanding of home program review  to improve patient's ability to progress to PLOF and address remaining functional goals.  (see flow sheet as applicable)    Details if applicable:    Patient reports she is eligible for a new Flexitouch in 2025 so will investigate this option when she returns for garment measuring in March of 2025.    Skin/wound  care/debridement:   Reviewed skin care principles:    Low pH lotion  Application following MLD principles    Educated in skin care routine with regard to compression garment use and donning and doffing.     Addressed patient questions about skin status after skin cancer removal and advised her to discuss the compression use with her Dermatologist to see how they want her to proceed.  Discussed the compression can help with healing but they decide not to risk disturbing the area for couple of days.  Also discussed that if they are okay with her wearing compression after the procedure, that she may want to don an older garment that is easier to get on to minimize the friction on the areas of concern.  She verbalized understanding.         [phone] Orthotic/Prosthetic Management and/or Training UE, LE and/or trunk, subsequent orthotic(s)/prosthetic(s) encounter (timed): modification, fitting adjustments and additional training to improve positioning of lower extremity during weight bearing and gait, improve pressure distribution of the plantar aspect  of the foot, improve upper extremity performance, improve postural stability to improve patient's ability to progress to PLOF and address remaining functional goals.    Details if applicable:    Education: Educated patient in compression garment donning and doffing., Glove use with donning., Daily wear schedule., Daily laundering., Garment lifespan., and Return and reordering process (by bringing prior garments into the clinic).      Educated patient in night compression garment use including donning and doffing and wearing schedule options.  This garment can be worn during the day if she chooses to do so.   Demonstrated donning and doffing followed by redemonstration by patient.  She was able to don the garment independent.   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  Provided patient with velfoam to utilize for pressure relief between affected fingers and discussed option of adding digit spacers on future orders.  Demonstrated this option for patient.  Discussed that her old garment just may be too tight on her hand as well and that switching to the new garment may solve the problem.  Patient reports it is a problem with one particular old night garment.  Advised her to retire that product from use.  Educated in Biomedical engineer and care.      Educated patient in day  compression garment use including donning and doffing and wearing schedule. Patient was unaware of seam and elbow comfort correct alignment for garment comfort and to prevent elbow irritation.  Demonstrated alignment on a garment that is a lighter color and is easier to see.  Also educated her son Emily [name] who is assisting with donning and doffing at home.  Provided with dicem and a slippee and demonstrated how to use each tool to assist with donning.  Donned the garment multiple times in the clinic and Emily [name] assisted as well with independence with practice.  Reviewed garment laundering and care and discussed not placing this sleeve in the dryer.    Emily [name] was independent with doffing the garment.  Patient was instructed to work with the garment the rest of the week and then contact the clinic to confirm that she wants to proceed with reordering the elvarex garment.  She initially stated she wanted to order her second set in [employer] but once Elvarex was in place she liked the way it felt and thought is was very comfortable.  Discussed using the elvarex products with air travel and more intense activities.  She may opt to order Juzo in 6 months to alternate garments.    Patient/family demonstrated donning and doffing with independence    58 58    Total Total     Modalities Rationale:   To  prevent worsening of swelling over time to improve the patient's ability to perform ADL and IADL skills           12 Compression garment measuring and fitting    (Unbilled Time) Upper/Lower Extremity Compression: Fitted for the following compression products:    UE: Left Night Garment, Glove, and Arm Sleeve    Style: Flat knit and Foam based    Brand: Elvarex and Solaris    Type:  Custom: elvarex sleeve compression class 2, with silicone band and elbow comfort, custom elvarex glove compression class 1, custom solaris low profile tribute tip of fingers to axilla with outer jacket     Vendor:  [employer]  Fitted for daytime compression sleeve and glove fit well and patient requests to reoder in cherry and bronze colors.  Patient reports garment is comfortable once it is donned.    Fitted for night-time solaris compression garment with good fit noted.  Patient is noting some increased arthritis in her fingers and so requested to try to reorder with digit spacers in place. Advised that it is unclear when she will be able to reorder.              Patient Education: []  Review HEP    [x]   Patient Education billed concurrently with other procedures   []  MLD Patient Education   []  Progressed/Changed HEP based on:   []  positioning   []  Kinesiotape   []  Skin care   []  wound care   [x]  other: compression garment donning and doffing, laundering, wearing schedule, lifespan,    [x]  Day and night garment use, reordering, modification options  Patient / caregiver re-demonstrated bandaging. []  Yes  []  No  Compression bandaging/garment precautions reviewed: []  Yes  []  No      Other Objective/Functional Measures     FOTO scores 51 versus 48 in evaluation.  Pain Level at end of session (0-10 scale): 0 out of 10 numeric scale      Assessment   Patient received new day and night custom compression garments with good fit of all products noted.  They required some practice and education in correct donning procedures but were independent by the end of the session.  Will reorder products with [employer] once patient calls later in the week to  confirm that everything is okay.  Patient is to call upon garment delivery if there are fit issues upon delivery.  Short term goal 2 is met.    Patient will continue to benefit from skilled PT / OT services to address swelling, analyze compression product fit and use, instruct in home lymphedema management program, and measure for compression products to address functional deficits and attain remaining goals.    Progress toward goals / Updated goals:  [x]   See Progress Note/Recertification  Short Term Goals: To be accomplished in 4 treatments    1. Patient will be measured for and obtain comfortable and optimal fitting day and night compression products to prevent reaccumulation of fluid at discharge which could impair patient's ability to perform safe mobility and dressing.   2. Patient/caregiver will demonstrate improved edema management as evidenced by performing donning/doffing of garments modified independent 3/3 times within session to aid in reducing risk for infection and promote transition to maintenance phase of CDT         PLAN  Yes  Continue plan of care  Re-Cert Due: 7/42/5956  []   Upgrade activities as tolerated  []   Discharge due to:  [x]   Other: reorder garments and patient is to call on delivery     Jodene Nam, MSPT, CLT-[facility]NA      05/16/2023       11:47 AM

## 2023-06-09 NOTE — Progress Notes (Signed)
 Subjective:   [name] is a 72 y.o. female.    Chief Complaint: Follow-up of the Left Shoulder    Sheri Gatchel presents today for a HA injection into the left shoulder.  We have been treating her conservatively for left shoulder glenohumeral arthritis.  She receives HA injections into the right shoulder which have provided her with excellent relief.      Objective:     Vitals:    06/09/23 1004   BP: 117/78   Pulse: 69   Weight: 119 lb   Height: 5' 6     Constitutional:  No acute distress. Well nourished. Well developed.  Respiratory:  No labored breathing.  Cardiovascular:  No marked edema.  Psychiatric: Alert and oriented x3.  Left Shoulder:   Inspection of the left shoulder reveals no skin changes or discoloration. There are no signs of infection present.  There is crepitation with range of motion.  Range of motion is nearly full but painful.  Strength is 5/5 throughout.    Radiographs:             No imaging obtained      Assessment:       ICD-10-CM   1. 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 She was instructed to rest and modify her activities for the next 24-48 hours, apply ice or heat as needed, and call if she has any difficulties or questions.     Procedures:      Large Joint Arthrocentesis: L glenohumeral  Performed by: Elwood Jacobus, PA-C  Authorized by: Elwood Jacobus, PA-C      Consent given by: patient  Timeout: Immediately prior to procedure a time out was called to verify the correct patient, procedure, equipment, support staff and site/side marked as required   Supporting Documentation  Indications: pain   Procedure Details  Location:  Shoulder L glenohumeral   Preparation: ethylchloride used.  Needle size: 22 G    Approach: anterior    Medications administered: 3 mL Durolane 60 MG/3ML              Orders Placed This Encounter   . Large Joint Arthrocentesis      No follow-ups on file.          [employer], PA-C

## 2023-07-05 ENCOUNTER — Encounter

## 2023-07-05 NOTE — Telephone Encounter (Signed)
 Received a message from Vickie at Dr. Dorine office [phone]) [phone]  She stated that she needed assistance ordering an annual breast MRI for this patient.  After reviewing the chart, the patient's surgery was canceled in April d/t other health concerns and was supposed to follow up in 2 months (which would have been June). I tried to call Vickie back but the phone went the physician's after hours line. I will return her call on Friday.     I attempted to call the patient herself to tell her to schedule an appointment, but it went to voicemail. I left a voicemail asking her to call back.

## 2023-07-06 ENCOUNTER — Encounter

## 2023-07-07 NOTE — Telephone Encounter (Signed)
I called Chip Boer back and left a voicemail.

## 2023-07-13 ENCOUNTER — Encounter

## 2023-07-13 NOTE — Progress Notes (Signed)
Patient called and left message asking for orders for [employer] for annual bras and prothesis. Message sent to MAs to enter order.

## 2023-07-17 NOTE — Progress Notes (Signed)
DME order, office note and signed SWO faxed to [employer].  Confirmation received.

## 2023-08-04 ENCOUNTER — Encounter

## 2023-08-11 ENCOUNTER — Inpatient Hospital Stay: Admit: 2023-08-11 | Payer: MEDICARE

## 2023-08-11 VITALS — BP 127/70 | HR 79 | Temp 98.00000°F | Resp 20 | Ht 66.0 in | Wt 123.0 lb

## 2023-08-11 DIAGNOSIS — M810 Age-related osteoporosis without current pathological fracture: Secondary | ICD-10-CM

## 2023-08-11 LAB — POC CHEM 8
Anion Gap, POC: 9.1 mmol/L — ABNORMAL LOW (ref 10–20)
POC Chloride: 99 mmol/L (ref 98–107)
POC Creatinine: 0.46 mg/dL — ABNORMAL LOW (ref 0.6–1.3)
POC Glucose: 125 mg/dL — ABNORMAL HIGH (ref 74–99)
POC Ionized Calcium: 1.12 mmol/L — ABNORMAL LOW (ref 1.15–1.33)
POC Potassium: 4.2 mmol/L (ref 3.5–5.1)
POC Sodium: 134 mmol/L — ABNORMAL LOW (ref 136–145)
POC TCO2: 25.9 mmol/L (ref 21–32)
eGFR, POC: >90 mL/min/{1.73_m2} (ref >60)

## 2023-08-11 MED ORDER — ROMOSOZUMAB-AQQG 105 MG/1.17ML SC SOSY
105 | Freq: Once | SUBCUTANEOUS | Status: AC
Start: 2023-08-11 — End: 2023-08-11
  Administered 2023-08-11: 20:00:00 105 mg via SUBCUTANEOUS

## 2023-08-11 MED FILL — EVENITY 105 MG/1.17ML SC SOSY: 105 MG/1.17ML | SUBCUTANEOUS | Qty: 1.17

## 2023-08-11 NOTE — Progress Notes (Signed)
[facility] Progress Note    Date: August 11, 2023    Name: Emily [name]    MRN: [id number]         DOB: [birth year]    Emily [name] Arrived ambulatory and in no distress for Evenity Injection.  Assessment was completed, no acute issues at this time, no new complaints voiced.        Emily [name]'s vitals were reviewed.  Vitals:    08/11/23 1415   BP: 127/70   Pulse: 79   Resp: 20   Temp: 98 F (36.7 C)   SpO2: 98%         Medications:  Medications Administered         romosozumab-aqqg (EVENITY) injection 105 mg Admin Date  08/11/2023 Action  Given Dose  105 mg Route  SubCUTAneous Documented By  Isabella Stalling, RN        romosozumab-aqqg (EVENITY) injection 105 mg Admin Date  08/11/2023 Action  Given Dose  105 mg Route  SubCUTAneous Documented By  Isabella Stalling, RN              Emily [name] tolerated treatment well and [facility].   Patient is aware of their next appointment.    Isabella Stalling, RN  August 11, 2023

## 2023-08-16 ENCOUNTER — Ambulatory Visit: Payer: MEDICARE

## 2023-08-30 NOTE — Telephone Encounter (Signed)
 Pt called requesting to have HA injection. States she has lifetime benefits that will cover the injection. Scheduled for 2/3 @ 8:30am.     Pt also provided [place] direct line for [employer] with [employer]. Called [phone]. LVM for Angelique Blonder and requested

## 2023-09-10 ENCOUNTER — Encounter: Admit: 2023-09-10

## 2023-09-10 DIAGNOSIS — M810 Age-related osteoporosis without current pathological fracture: Secondary | ICD-10-CM

## 2023-09-11 ENCOUNTER — Inpatient Hospital Stay: Admit: 2023-09-11 | Disposition: A | Discharge status: Home / Self Care | Payer: MEDICARE

## 2023-09-11 VITALS — BP 122/69 | HR 84 | Temp 97.90000°F | Resp 18 | Ht 66.0 in | Wt 125.0 lb

## 2023-09-11 DIAGNOSIS — M810 Age-related osteoporosis without current pathological fracture: Principal | ICD-10-CM

## 2023-09-11 LAB — POC CHEM 8
Anion Gap, POC: 7.4 mmol/L — ABNORMAL LOW (ref 10–20)
POC Chloride: 100 mmol/L (ref 98–107)
POC Creatinine: 0.5 mg/dL — ABNORMAL LOW (ref 0.6–1.3)
POC Glucose: 95 mg/dL (ref 74–99)
POC Ionized Calcium: 1.17 mmol/L (ref 1.15–1.33)
POC Potassium: 4 mmol/L (ref 3.5–5.1)
POC Sodium: 135 mmol/L — ABNORMAL LOW (ref 136–145)
POC TCO2: 27.6 mmol/L (ref 21–32)
eGFR, POC: >90 mL/min/{1.73_m2} (ref >60)

## 2023-09-11 MED ORDER — ROMOSOZUMAB-AQQG 105 MG/1.17ML SC SOSY
105 | Freq: Once | SUBCUTANEOUS | Status: AC
Start: 2023-09-11 — End: 2023-09-11
  Administered 2023-09-11: 20:00:00 105 mg via SUBCUTANEOUS

## 2023-09-11 MED FILL — EVENITY 105 MG/1.17ML SC SOSY: 105 MG/1.17ML | SUBCUTANEOUS | Qty: 1.17 | Fill #0

## 2023-09-11 NOTE — Progress Notes (Signed)
 [facility] Progress Note    Date: September 11, 2023    Name: Emily [name]    MRN: [id number]         DOB: [birth year]    Ms. [name] Arrived ambulatory and in no distress for Evenity Injection.  Assessment was completed, no acute issues at this time, no new comp

## 2023-09-29 ENCOUNTER — Inpatient Hospital Stay
Admit: 2023-09-29 | Disposition: A | Discharge status: Home / Self Care | Payer: MEDICARE | Source: Ambulatory Visit | Attending: Surgery | Admitting: Surgery

## 2023-09-29 DIAGNOSIS — Z853 Personal history of malignant neoplasm of breast: Principal | ICD-10-CM

## 2023-09-29 MED ORDER — GADOBUTROL 1 MMOL/ML IV SOLN
1 | Freq: Once | INTRAVENOUS | Status: AC | PRN
Start: 2023-09-29 — End: 2023-09-29
  Administered 2023-09-29: 15:00:00 6 mL via INTRAVENOUS

## 2023-09-29 MED FILL — GADAVIST 1 MMOL/ML IV SOLN: 1 MMOL/ML | INTRAVENOUS | Qty: 7.5

## 2023-10-13 ENCOUNTER — Inpatient Hospital Stay: Admit: 2023-10-13 | Discharge: 2023-10-13 | Payer: MEDICARE

## 2023-10-13 VITALS — BP 117/63 | HR 61 | Temp 98.30000°F | Resp 18 | Ht 65.98 in | Wt 121.9 lb

## 2023-10-13 DIAGNOSIS — M810 Age-related osteoporosis without current pathological fracture: Secondary | ICD-10-CM

## 2023-10-13 LAB — POC CHEM 8
Anion Gap, POC: 8.8 mmol/L — ABNORMAL LOW (ref 10–20)
POC Chloride: 101 mmol/L (ref 98–107)
POC Creatinine: 0.49 mg/dL — ABNORMAL LOW (ref 0.6–1.3)
POC Glucose: 78 mg/dL (ref 74–99)
POC Ionized Calcium: 1.19 mmol/L (ref 1.15–1.33)
POC Potassium: 4.3 mmol/L (ref 3.5–5.1)
POC Sodium: 137 mmol/L (ref 136–145)
POC TCO2: 27.2 mmol/L (ref 21–32)
eGFR, POC: >90 mL/min/{1.73_m2} (ref >60)

## 2023-10-13 MED ORDER — ROMOSOZUMAB-AQQG 105 MG/1.17ML SC SOSY
105 | Freq: Once | SUBCUTANEOUS | Status: AC
Start: 2023-10-13 — End: 2023-10-13
  Administered 2023-10-13: 20:00:00 105 mg via SUBCUTANEOUS

## 2023-10-13 MED FILL — EVENITY 105 MG/1.17ML SC SOSY: 105 MG/1.17ML | SUBCUTANEOUS | Qty: 1.17

## 2023-10-13 NOTE — Telephone Encounter (Signed)
 Called with normal MRI

## 2023-10-13 NOTE — Progress Notes (Signed)
[facility] Progress Note    Date: October 13, 2023        1400: Pt arrived ambulatory to [facility] for Evenity in stable condition.  Assessment completed. Labs drawn and sent for processing.    Labs reviewed. Criteria for treatment was met.    Patient Vitals for the past 12 hrs:   Temp Pulse Resp BP SpO2   10/13/23 1415 98.3 F (36.8 C) 61 18 117/63 95 %     Recent Results (from the past 12 hour(s))   POC CHEM 8    Collection Time: 10/13/23  2:20 PM   Result Value Ref Range    POC Ionized Calcium 1.19 1.15 - 1.33 mmol/L    POC Sodium 137 136 - 145 mmol/L    POC Potassium 4.3 3.5 - 5.1 mmol/L    POC Chloride 101 98 - 107 mmol/L    POC TCO2 27.2 21 - 32 mmol/L    Anion Gap, POC 8.8 (L) 10 - 20 mmol/L    POC Glucose 78 74 - 99 mg/dL    POC Creatinine 5.78 (L) 0.6 - 1.3 mg/dL    eGFR, POC >46 >96 EX/BMW/4.13K4    UA Comment Comment Not Indicated.       Patient denies any dental procedures or broken bones.    Medications Administered         romosozumab-aqqg (EVENITY) injection 105 mg Admin Date  10/13/2023 Action  Given Dose  105 mg Route  SubCUTAneous Documented By  Karie Fetch, RN        romosozumab-aqqg (EVENITY) injection 105 mg Admin Date  10/13/2023 Action  Given Dose  105 mg Route  SubCUTAneous Documented By  Karie Fetch, RN           Given to the Right arm and RLQ    Ms. Ramseur tolerated the infusion, and had no complaints.        82 Ms. Freund [facility] condition. Patient is aware of next scheduled [facility] appointment 2/10//25 at 1430.         Future Appointments   Date Time Provider Department Center   11/13/2023  2:30 PM SS FASTTRACK 2 [facility]   12/11/2023  9:00 AM Denney-Logan, Marzella Schlein, PT [facility]   12/11/2023  2:00 PM SS FASTTRACK 3 [facility]   01/11/2024  2:00 PM SS FASTTRACK 3 [facility]   02/12/2024  1:30 PM SS FASTTRACK 1 [facility]         Bao Coreas, RN, RN  October 13, 2023

## 2023-10-27 ENCOUNTER — Encounter

## 2023-11-13 ENCOUNTER — Encounter

## 2023-11-13 ENCOUNTER — Inpatient Hospital Stay: Admit: 2023-11-13 | Discharge: 2023-11-13 | Payer: MEDICARE

## 2023-11-13 VITALS — BP 111/64 | HR 75 | Temp 97.70000°F | Resp 18 | Ht 65.98 in | Wt 122.8 lb

## 2023-11-13 DIAGNOSIS — M810 Age-related osteoporosis without current pathological fracture: Secondary | ICD-10-CM

## 2023-11-13 LAB — POC CHEM 8
Anion Gap, POC: 9.5 mmol/L — ABNORMAL LOW (ref 10–20)
POC Chloride: 102 mmol/L (ref 98–107)
POC Creatinine: 0.67 mg/dL (ref 0.6–1.3)
POC Glucose: 134 mg/dL — ABNORMAL HIGH (ref 74–99)
POC Ionized Calcium: 1.14 mmol/L — ABNORMAL LOW (ref 1.15–1.33)
POC Potassium: 4.3 mmol/L (ref 3.5–5.1)
POC Sodium: 136 mmol/L (ref 136–145)
POC TCO2: 24.5 mmol/L (ref 21–32)
eGFR, POC: >90 mL/min/{1.73_m2} (ref >60)

## 2023-11-13 MED ORDER — ROMOSOZUMAB-AQQG 105 MG/1.17ML SC SOSY
1051.17 | Freq: Once | SUBCUTANEOUS | Status: AC
Start: 2023-11-13 — End: 2023-11-13
  Administered 2023-11-13: 21:00:00 105 mg via SUBCUTANEOUS

## 2023-11-13 MED FILL — EVENITY 105 MG/1.17ML SC SOSY: 1051.17 MG/1.17ML | SUBCUTANEOUS | Qty: 1.17

## 2023-11-13 NOTE — Progress Notes (Signed)
[facility] Progress Note    Date: November 13, 2023        1430: Pt arrived ambulatory to [facility] for Evenity  in stable condition.  Assessment completed. Labs drawn and sent for processing.    Labs reviewed. Criteria for treatment was met.    Patient Vitals for the past 12 hrs:   Temp Pulse Resp BP SpO2   11/13/23 1445 97.7 F (36.5 C) 75 18 111/64 95 %     Recent Results (from the past 12 hour(s))   POC CHEM 8    Collection Time: 11/13/23  3:02 PM   Result Value Ref Range    POC Ionized Calcium 1.14 (L) 1.15 - 1.33 mmol/L    POC Sodium 136 136 - 145 mmol/L    POC Potassium 4.3 3.5 - 5.1 mmol/L    POC Chloride 102 98 - 107 mmol/L    POC TCO2 24.5 21 - 32 mmol/L    Anion Gap, POC 9.5 (L) 10 - 20 mmol/L    POC Glucose 134 (H) 74 - 99 mg/dL    POC Creatinine 1.61 0.6 - 1.3 mg/dL    eGFR, POC >09 >60 AV/WUJ/8.11B1    UA Comment Comment Not Indicated.           Medications Administered         romosozumab-aqqg (EVENITY) injection 105 mg Admin Date  11/13/2023 Action  Given Dose  105 mg Route  SubCUTAneous Documented By  Karie Fetch, RN        romosozumab-aqqg (EVENITY) injection 105 mg Admin Date  11/13/2023 Action  Given Dose  105 mg Route  SubCUTAneous Documented By  Karie Fetch, RN           Given to Right Arm and LLQ    Ms. Molner tolerated the injections, and had no complaints.    PIV flushed and removed. 2x2 and coban placed    Ms. Shands was [facility] in stable condition. Patient is aware of next scheduled [facility] appointment 12/11/23 at 1400.         Future Appointments   Date Time Provider Department Center   12/11/2023  9:00 AM Cletis Athens, PT [facility]   12/11/2023  2:00 PM SS FASTTRACK 3 [facility]   01/11/2024  2:00 PM SS FASTTRACK 3 [facility]   02/12/2024  1:30 PM SS FASTTRACK 1 [facility]         Lonia Roane, RN, RN  November 13, 2023

## 2023-12-04 ENCOUNTER — Encounter

## 2023-12-11 ENCOUNTER — Inpatient Hospital Stay: Payer: MEDICARE

## 2023-12-13 ENCOUNTER — Inpatient Hospital Stay: Admit: 2023-12-13 | Discharge: 2023-12-13 | Payer: MEDICARE

## 2023-12-13 VITALS — BP 136/63 | HR 72 | Temp 98.00000°F | Resp 18 | Wt 122.8 lb

## 2023-12-13 DIAGNOSIS — M810 Age-related osteoporosis without current pathological fracture: Secondary | ICD-10-CM

## 2023-12-13 LAB — POC CHEM 8
Anion Gap, POC: 11.4 mmol/L (ref 10–20)
POC Chloride: 99 mmol/L (ref 98–107)
POC Creatinine: 0.53 mg/dL — ABNORMAL LOW (ref 0.6–1.3)
POC Glucose: 97 mg/dL (ref 74–99)
POC Ionized Calcium: 1.26 mmol/L (ref 1.15–1.33)
POC Potassium: 4.2 mmol/L (ref 3.5–5.1)
POC Sodium: 136 mmol/L (ref 136–145)
POC TCO2: 25.6 mmol/L (ref 21–32)
eGFR, POC: >90 mL/min/{1.73_m2} (ref >60)

## 2023-12-13 MED ORDER — ROMOSOZUMAB-AQQG 105 MG/1.17ML SC SOSY
105 | Freq: Once | SUBCUTANEOUS | Status: AC
Start: 2023-12-13 — End: 2023-12-13
  Administered 2023-12-13: 20:00:00 105 mg via SUBCUTANEOUS

## 2023-12-13 MED FILL — EVENITY 105 MG/1.17ML SC SOSY: 105 MG/1.17ML | SUBCUTANEOUS | Qty: 1.17 | Fill #0

## 2023-12-13 NOTE — Progress Notes (Signed)
 [facility] Progress Note    Date: December 13, 2023    Name: Emily [name]    MRN: [id number]         DOB: April 16, 1951    Emily [name] Arrived ambulatory and in no distress for Evenity Injection.  Assessment was completed, no acute issues at this time, no new complaints voiced.  PIV labs drawn and sent for processing. Patient denies any recent or planned invasive dental work at this time.       Emily [name]'s vitals were reviewed.  Vitals:    12/13/23 1549   BP: 136/63   Pulse: 72   Resp: 18   Temp: 98 F (36.7 C)   SpO2: 98%     Recent Results (from the past 12 hours)   POC CHEM 8    Collection Time: 12/13/23  3:48 PM   Result Value Ref Range    POC Ionized Calcium 1.26 1.15 - 1.33 mmol/L    POC Sodium 136 136 - 145 mmol/L    POC Potassium 4.2 3.5 - 5.1 mmol/L    POC Chloride 99 98 - 107 mmol/L    POC TCO2 25.6 21 - 32 mmol/L    Anion Gap, POC 11.4 10 - 20 mmol/L    POC Glucose 97 74 - 99 mg/dL    POC Creatinine 1.60 (L) 0.6 - 1.3 mg/dL    eGFR, POC >10 >93 AT/FTD/3.22G2    UA Comment Comment Not Indicated.          Medications:  Medications Administered         romosozumab-aqqg (EVENITY) injection 105 mg Admin Date  12/13/2023 Action  Given Dose  105 mg Route  SubCUTAneous Documented By  Willy Eddy, RN        romosozumab-aqqg (EVENITY) injection 105 mg Admin Date  12/13/2023 Action  Given Dose  105 mg Route  SubCUTAneous Documented By  Willy Eddy, RN           Evenity administered SQ in patients right upper arm, and SQ in patients abdomen due to patient having cellulitis in the left arm.     Emily [name] tolerated treatment well and was [facility] in [facility] condition.   Patient is aware of future appointments.    Future Appointments   Date Time Provider Department Center   01/04/2024 12:00 PM Cletis Athens, PT [facility]   01/11/2024  2:00 PM SS FASTTRACK 3 [facility]   02/12/2024  1:30 PM SS FASTTRACK 1 [facility]       Willy Eddy, RN  December 13, 2023

## 2023-12-27 ENCOUNTER — Encounter

## 2024-01-04 ENCOUNTER — Inpatient Hospital Stay: Admit: 2024-01-04 | Payer: MEDICARE

## 2024-01-04 VITALS — Ht 66.0 in | Wt 123.4 lb

## 2024-01-04 DIAGNOSIS — I890 Lymphedema, not elsewhere classified: Secondary | ICD-10-CM

## 2024-01-04 NOTE — Other (Signed)
 [facility]  a part of [facility]   [address] [place], [address]  [place], [place] [address]  Phone: [phone]    Fax: [phone]                                                                                PT/OT LYMPHEDEMA - EVALUATION/PLAN OF CARE NOTE (updated 3/23)      Date: 01/04/2024          Patient Name:  Emily [name] DOB:  1951/02/03   Medical   Diagnosis:  Lymphedema of left arm [I89.0] Treatment Diagnosis:  I97.2     Post-Mastectomy lymphedema syndrome    Referral Source:  Binnie Rail, * Provider #:  [id number]                Insurance: Payor: MEDICARE / Plan: MEDICARE PART A AND B / Product Type: *No Product type* /      Patient DOB verified yes     Visit #   Current  / Total 1 1   Time   In / Out  1210  1328   Total Treatment Time  78   Total Timed Codes  28   1:1 Treatment Time  28      [facility] BC Totals Reminder:  bill using total billable   min of TIMED therapeutic procedures and modalities.   8-22 min = 1 unit; 23-37 min = 2 units; 38-52 min = 3 units;  53-67 min = 4 units; 68-82 min = 5 units         SUBJECTIVE  Pain Level (0-10 scale): 4 out of 10 numeric scale currently heaviness and fullness, pain in armpit    [x] constant [] intermittent [] improving [] worsening [] no change since onset    Any medication changes, allergies to medications, adverse drug reactions, diagnosis change, or new procedure performed?: []  No    [x]  Yes (see summary sheet for update)  Medications: Verified on Patient Summary List   Admitted for cellulitis in March    Subjective functional status/changes:       I find myself trying to catch myself and straighten up to improve my posture.  It was month  ago 12/04/2023 I was admitted to [facility] for left arm cellulitis.  I need the finger separaters on the night garment when we order them because otherwise I get sore on my fingers.    Start of Care: 01/04/2024  Onset Date: 11/28/2018   Current symptoms/Complaints: swelling in left arm  and chest region, pain and tightness in left arm, chest and armpit, unable to reach overhead, unable to lift heavier objects with left arm due to it makes the swelling worse. Patient reports recent cellulitis  Mechanism of Injury:     Patient presents with chronic left upper extremity lymphedema s/p left breast mastectomy with history of radiation and breast reconstruction in 2013 and 2014.  Patient has a history of recurrent cellulitis infections with last infection reported by patient to be in February of 2024 with 5 day hospitalization for cellulitis.  She was wearing compression consistently and has day and night compression products.  She also has a  Flexitouch that she is using consistently every other day.  Patient is concerned about recurrent infections and trying to prevent them in the future.  Patient also reports significant chest and shoulder tightness with a history of axillary web syndrome.    Patient is requesting to replace her day and night compression garments currently.  She wants to switch to Juzo silver for daytime garments.     PLOF: : Independent gait without any assistive device for community distances, modified independent to with self care and functional activities due to increased arm size and swelling, activities require increased time, unable to reach overhead, clothing fit is an issue for the left arm  Limitations to PLOF/Activity or Recreational Limitations: unable to play catch with son, can't play tennis with son any longer secondary to pain, lymphedema and decreased mobility in left arm, can only exercise from waist down with activities such as walking, avoids lifting which tends to maker her swell up more as she noted when she move a couple of weeks ago  Work Hx:  Retired  Living Situation: Patient is married and has moved back to [place] from [place].  Has an adopted 47 year old son named [name].  Husband is a retired Estate agent.  Hobbies include painting and pine cone art  Mobility:  Independent gait without any assistive device for community distances  Self Care: modified independent with self care and functional activities with increased time and effort, unable to reach overhead with left arm due to left shoulder  Previous Treatment/Compliance: wears compression garments daily, night garments nightly, Flexitouch  every other day, exercise in the form of walking and she elevates her arm daily  PMHx/Surgical Hx/Comorbidites:   Past Medical History:   Diagnosis Date    Anxiety     Arthritis     Basal cell carcinoma     TO LEGS, LT ARM    Bronchiectasis (HCC) 08/2020    Cancer (HCC)     left breast  cancer    Chronic tension headaches     Concussion 2011    Headache(784.0)     OCCAS    Hx of radiation therapy     COMPLETED IN 2014    Ill-defined condition     cellulitis L UE    Lymphedema of arm     left    Nausea & vomiting     Other and unspecified symptoms and signs involving general sensations and perceptions     HIVES RECENTLY- WAS IN ER Feb 19, 2013    Other ill-defined conditions(799.89)     PHEOCHROMOCYTOMA    S/P chemotherapy, time since greater than 12 weeks     COMPLETED CHEMOTHERAPY IN JUNE 2014    SCCA (squamous cell carcinoma) of skin       Past Surgical History:   Procedure Laterality Date    APPENDECTOMY      BREAST RECONSTRUCTION  11/28/2012    BREAST RECONSTRUCTION /C  INSERTION EXPANDER & ALLODERM performed by Ivar Bury, MD at [facility] AMBULATORY OR    BREAST RECONSTRUCTION Bilateral 09/16/2013    REMOVAL AND REPLACEMENT LEFT BREAST IMPLANT TISSUE EXPANDERS WITH SUBTOTAL CAPSULECTOMY, RIGHT BREAST IMPLANT REMOVAL AND REPLACEMENT FOR SYMMETRY/FAT GRAFTING TO BILATERAL BREASTS performed by Ivar Bury, MD at [facility] MAIN OR    BREAST SURGERY  '91    breast implants; replaced bilat implants 2011    BREAST SURGERY Left 04/22/2014    REMOVAL OF LEFT BREAST IMPLANT, WASHOUT AND CLOSURE OF LEFT BREAST WOUND  performed by Ivar Bury, MD at [facility] MAIN OR    COLONOSCOPY      ENDOSCOPY,  COLON, DIAGNOSTIC      GYN      c-section    HEENT      right adrenal removed    HEENT      T&A    MASTECTOMY Left     MASTECTOMY, MODIFIED RADICAL  11/28/2012    LEFT BREAST MODIFIED RADICAL MASTECTOMY WITH AXILLARY DISSECTION W/ RECONSTRUCTION LEFT BREAST WITH TISSUE EXPANDER AND ALLODERM;PORTACATH INSERTION performed by Binnie Rail, MD at [facility] AMBULATORY OR    ORTHOPEDIC SURGERY      bilateral shoulder arthroscopy    OVARY REMOVAL      right    PR UNLISTED PROCEDURE ABDOMEN PERITONEUM & OMENTUM      right adenal removed    PR UNLISTED PROCEDURE ABDOMEN PERITONEUM & OMENTUM      hernia mesh repair    TONSILLECTOMY      VASCULAR SURGERY      PORT, THEN REMOVED      Prior Hospitalization:  Hospitalized in February 2024 and again in March 3- 8, 2025 for cellulitis  Barriers: [] pain [] Financial [] time [] transportation [x] Other: none noted  Substance use: [] Alcohol [] Tobacco [x] other: none noted  Pt Goals: less swelling, better circulation and mobility  Motivation: Patient is motivated to continue to manage swelling  Cognition: A & O x 4          Objective/Functional Outcome Measure: FOTO scores 51    OBJECTIVE    Current or Previous Compression Garment(s):  jobst elvarex sleeve and glove, Juzo expert silver class 1 custom flat knit compression sleeve with silicone band, Juzo expert custom class 1 compression sleeve with silicone,  hand pieces including gloves and gauntlets, Solaris Tribute custom arm garment ordered in 2022,  Jobst Relax night compression garment, solaris tribute with finger separators and low profile foam 2024 ( 2 but only 1 has finger separators)                       Compression Pump:  Flexitouch home vasopneumatic device received in 2015, patient has been utilizing the vasopneumatic device for 10 years    Skin Integrity and Tissue Assessment  Dermal Status: Intact and Scars, scabs are present between fingers on left hand that patient reports the areas open and close between her fingers,  patient presents with increased arthritic changes on nodules on hands left greater than right  Texture/Consistency:  Boggy  forearm   Sensation:  intact to light touch  Pigmentation/Color Change: Normal  Circulatory: Vascular studies ruled out DVT in past   Nails: Normal  Stemmers Sign: negative    Height: 5'6"  Weight:  123.4lbs ( was 124lbs on 03/20/2023)  BMI:  20  (36 or greater: adversely affecting lymphedema)       Volumetric Measurements:   Right:  1,645.78 mL (decreased by  87.6 ml since last assessment  03/20/2023 ) Left:  1,901.91 mL (decreased by 115.86 ml since last assessment 03/20/2023)   % Difference:  15.56 % left larger than right versus 16.41% left larger right on last assessment 03/20/2023 Dominance: right      File number:        24 min [x] Eval - untimed                        Therapeutic Procedures:  Tx Min Billable or 1:1 Min (  if diff from Tx Min) Procedure, Rationale, Specifics   10  10 97535 Self Care/Home Management (timed):  improve patient knowledge and understanding of pain reducing techniques, positioning, physical therapy expectations, procedures and progression, and skin care  to improve patient's ability to progress to PLOF and address remaining functional goals.  (see flow sheet as applicable)    Details if applicable:  Discussed vasopneumatic device use which patient is using consistently.  She is utilizing the device the recommended frequency and reports no problems with device function currently. Patient is also seeing a community massage therapist for manual lymph drainage.    Skin/wound care/debridement:   Reviewed skin care principles:    Low pH lotion  Application following MLD principles  Prevention of cellulitis     Patient is using aquaphor and cerave  on her arm daily.  She reports she had another cellulitis infection in March and has an infectious disease doctor now.  Discussed the research related to the frequency of cellulitis infections after the first infection occurs.   Patient is doing pine cone art and she uses pine cones she picks up in the forest.  She reports she does experience skin injuries with this process.  Encouraged glove use with outdoor activities which patient reports she plans to do.         18  18  97760 Orthotic Management and Training UE, LE and/or trunk, initial orthotic(s) encounter (timed): assessment and fitting to improve positioning of lower extremity during weight bearing and gait, improve pressure distribution of the plantar aspect of the foot, improve upper extremity performance, improve postural stability to improve patient's ability to progress to PLOF and address remaining functional goals.    Details if applicable:    Education: Educated patient in compression garment donning and doffing., Glove use with donning., Daily wear schedule., Daily laundering., Garment lifespan., and Return and reordering process (by bringing prior garments into the clinic).      Educated patient in Medicare coverage for compression garments and current status of coverage and ordering.  Educated patient in ordering process, authorization process and documentation required, and that current Medicare recommendations are not a guarantee of coverage.     Addressed patient questions about ordering new night compression and explained that she ordered 2 garments last year and is not eligible to reorder until next year.  Discussed hand status and she prefers to order with finger spacers on future orders due to recurrent skin injuries on her hand.    Discussed patient's request to order the Juzo silver custom sleeve.  Addressed questions about the silver option and potential cooling factor of wearing a silver based garment.  Addressed the difference between a silver wound dressing versus a silver based garment that does not have any evidence that it reduces infections.  Demonstrated expert silver material versus expert strong which she has ordered in the past and patient prefers the  expert silver today.  Reviewed 30 day remake process upon garment shipping and reordering additional products once fit is confirmed with first set.  Patient verbalized understanding.       She is instructed to call to schedule a garment fitting upon delivery.       28  28    Total Total     Modalities Rationale:   To  prevent worsening of swelling over time to improve the patient's ability to perform ADL and IADL skills            26 Compression garment  measuring and fitting     (Unbilled Time) Upper/Lower Extremity Compression: Measured for the following compression products:     UE: Left Glove and Arm Sleeve, night garment     Style: Flat knit      Brand: Juzo     Type: Custom: Juzo expert silver compression class 2 sleeve, with silicone band, custom juzo expert silver compression class 1 glove     Vendor: [employer]     Daytime compression:  Patient requires custom flat-knit left upper extremity compression sleeve and glove at this time for optimal swelling/lymphedema control and garment fit.  Patient's limb size is disproportionate with proximal limb being disproportionate in size to distal limb.  A poorly fitting compression garment will not provide adequate compression for long term swelling control and risks causing a tourniquet or skin breakdown.   Flat-knit material is needed for swelling containment and to prevent progression of swelling during wear so patient does not swell out of the garment.  Circular knit compression is inadequate to contain patient's swelling.  Silicone band is needed for garment suspension to prevent garment from sliding down the arm and causing skin irritation and increased pressure at gathering point which would lead to skin breakdown or irritation. Patient requires silver style material to minimize heat and perspiration secondary to recurrent cellulitis infections.        Patient requires 3 custom left upper extremity compression sleeves and 3 left hand custom compression  gloves for washing and wearing and to allow her to maintain the recommended wearing schedule to optimally manage the lymphedema.                   Patient Education: [x]  Review HEP /home lymphedema management routine   [x]   Patient Education billed concurrently with other procedures   []  MLD Patient Education   []  Progressed/Changed HEP based on:   []  positioning   []  Kinesiotape   [x]  Skin care   []  wound care   [x]  other: compression garment ordering process, garment options, garment lifespan  [x]   Medicare changes to compression garment coverage, ordering options, fitting and reordering  Patient / caregiver re-demonstrated bandaging. []  Yes  []  No  Compression bandaging/garment precautions reviewed: []  Yes  []  No      Pain Level at end of session (0-10 scale): 4 out of 10 numeric scale       Plan of Care / Statement of Necessity for Lymphedema Therapy Services     Assessment / key information:     Emily [name] is a 73 y.o. female who presents with  left upper extremity stage 2 lymphedema secondary to a history of left breast cancer with mastectomy and reconstruction.  Patient has a history of recurrent cellulitis infections with the last infection having been in March 2025.  Patient also has a history of axillary web cording on the left side and bilateral shoulder rotator cuff injuries.  Swelling is slightly decreased on assessment today and percentage difference between affected and unaffected extremity has also slightly decreased. Her current daytime compression garments are due to be replaced.    Patient continues to benefit from complete decongestive therapy (CDT) including: Manual lymphatic drainage (MLD) technique, Short-stretch textile bandages/compression system to decongest limb and Kinesiotaping as appropriate.         Evaluation Complexity:  History:  HIGH Complexity :3+ comorbidities / personal factors will impact the outcome/ POC ; Examination:  MEDIUM Complexity : 3 Standardized tests and  measures addressin  body structure, function, activity limitation and / or participation in recreation  ;Presentation:  MEDIUM Complexity : Evolving with changing characteristics  ;Clinical Decision Making:  MEDIUM Complexity : FOTO score of 26-74 Overall Complexity Rating: MEDIUM  Problem List: pain affecting function, decrease ROM, and edema affection function   Treatment Plan may include any combination of the following: 16109 Therapeutic Exercise, 97140 Manual Therapy, 97535 Self Care/Home Management, 97016 Vasopneumatic Device  (Vasopnuematic compression justification:  Per bilateral girth measures taken and listed above the edema is considered significant and having an impact on the patient's self care and ADL's), and 97760 Orthotic Management and Training  Patient / Family readiness to learn indicated by: asking questions, interest, and return verbalization   Persons(s) to be included in education: patient (P)  Barriers to Learning/Limitations: financial      Measures taken if barriers to learning present: not applicable  Patient Self Reported Health Status: good  Rehabilitation Potential: good     Short Term Goals: To be accomplished in 4 treatments    1. Patient will be measured for and obtain comfortable and optimal fitting day and night compression products to prevent reaccumulation of fluid at discharge which could impair patient's ability to perform safe mobility and dressing.   2. Patient/caregiver will demonstrate improved edema management as evidenced by performing donning/doffing of garments modified independent 3/3 times within session to aid in reducing risk for infection and promote transition to maintenance phase of CDT.      Frequency / Duration:  Patient to be seen 1 times in 4-10 weeks dependent on compression garment delivery timeframe followed by 1 visit every 2-3 weeks for 4 treatments.     Patient/ Caregiver education and instruction: Diagnosis, prognosis, self care and other compression  garments  [x]   Plan of care has been reviewed with PTA      Certification Period:  01/04/24  to 04/03/24         Jodene Nam, MSPT, CLT-LANA      01/04/2024       5:31 PM        ===================================================================  I certify that the above Therapy Services are being furnished while the patient is under my care. I agree with the treatment plan and certify that this therapy is necessary.    Physician's Signature:_________________________   DATE:_________   TIME:________                           Valora Corporal, Timoteo Expose, *    ** Signature, Date and Time must be completed for valid certification **  Please sign and fax to [phone].  Thank you

## 2024-01-11 ENCOUNTER — Inpatient Hospital Stay: Admit: 2024-01-11 | Discharge: 2024-01-11 | Payer: MEDICARE

## 2024-01-11 VITALS — BP 101/65 | HR 77 | Temp 97.70000°F | Resp 18 | Ht 66.0 in | Wt 118.8 lb

## 2024-01-11 DIAGNOSIS — M810 Age-related osteoporosis without current pathological fracture: Secondary | ICD-10-CM

## 2024-01-11 LAB — POC CHEM 8
Anion Gap, POC: 11.1 mmol/L (ref 10–20)
POC Chloride: 100 mmol/L (ref 98–107)
POC Creatinine: 0.49 mg/dL — ABNORMAL LOW (ref 0.6–1.3)
POC Glucose: 100 mg/dL — ABNORMAL HIGH (ref 74–99)
POC Ionized Calcium: 1.18 mmol/L (ref 1.15–1.33)
POC Potassium: 4.1 mmol/L (ref 3.5–5.1)
POC Sodium: 134 mmol/L — ABNORMAL LOW (ref 136–145)
POC TCO2: 22.9 mmol/L (ref 21–32)
eGFR, POC: >90 mL/min/{1.73_m2} (ref >60)

## 2024-01-11 MED ORDER — ROMOSOZUMAB-AQQG 105 MG/1.17ML SC SOSY
105 | Freq: Once | SUBCUTANEOUS | Status: AC
Start: 2024-01-11 — End: 2024-01-11
  Administered 2024-01-11: 19:00:00 105 mg via SUBCUTANEOUS

## 2024-01-11 MED FILL — EVENITY 105 MG/1.17ML SC SOSY: 105 MG/1.17ML | SUBCUTANEOUS | Qty: 1.17

## 2024-01-11 NOTE — Progress Notes (Signed)
 [facility] Progress Note    Date: January 11, 2024    Name: Emily [name]    MRN: [id number]         DOB: 02-Oct-1951    Emily [name] Arrived ambulatory and in no distress for Evenity Regimen.  Assessment was completed, no acute issues at this time, no new complaints voiced. Labs drawn peripherally. EPOC performed, results are within parameters to treat.     Criteria met for today's treatment.      Emily [name]'s vitals were reviewed.  Vitals:    01/11/24 1420   BP: 101/65   Pulse: 77   Resp: 18   Temp: 97.7 F (36.5 C)   SpO2: 94%       Lab results were obtained and reviewed.  Recent Results (from the past 12 hours)   POC CHEM 8    Collection Time: 01/11/24  2:28 PM   Result Value Ref Range    POC Ionized Calcium 1.18 1.15 - 1.33 mmol/L    POC Sodium 134 (L) 136 - 145 mmol/L    POC Potassium 4.1 3.5 - 5.1 mmol/L    POC Chloride 100 98 - 107 mmol/L    POC TCO2 22.9 21 - 32 mmol/L    Anion Gap, POC 11.1 10 - 20 mmol/L    POC Glucose 100 (H) 74 - 99 mg/dL    POC Creatinine 4.09 (L) 0.6 - 1.3 mg/dL    eGFR, POC >81 >19 JY/NWG/9.56O1    UA Comment Comment Not Indicated.         Medications:  Medications Administered         romosozumab-aqqg (EVENITY) injection 105 mg Admin Date  01/11/2024 Action  Given Dose  105 mg Route  SubCUTAneous Documented By  Lanney Gins, RN        romosozumab-aqqg (EVENITY) injection 105 mg Admin Date  01/11/2024 Action  Given Dose  105 mg Route  SubCUTAneous Documented By  Lanney Gins, RN           Medication sat out for 30 minutes prior to administration.     Given in right abdomen and right arm.       Emily [name] tolerated treatment well and [facility].She is to return on     Future Appointments   Date Time Provider Department Center   02/09/2024  9:45 AM SS CHEMO CHAIR 30 [facility]   03/11/2024  9:30 AM SS CHEMO CHAIR 16 [facility]   04/10/2024  9:30 AM SS CHEMO CHAIR 3 [facility]   05/10/2024  1:30 PM SS CHEMO CHAIR 14 [facility]    06/10/2024  9:30 AM SS CHEMO CHAIR 28 [facility]   07/10/2024 10:00 AM SS CHEMO CHAIR 26 [facility]

## 2024-01-25 ENCOUNTER — Encounter

## 2024-01-30 NOTE — Progress Notes (Signed)
 FAXED signed Rx for compression garment and office note to [employer]. Confirmation received

## 2024-02-09 ENCOUNTER — Inpatient Hospital Stay: Admit: 2024-02-09 | Discharge: 2024-02-09 | Payer: MEDICARE

## 2024-02-09 VITALS — BP 129/77 | HR 69 | Temp 97.80000°F | Resp 18

## 2024-02-09 DIAGNOSIS — M810 Age-related osteoporosis without current pathological fracture: Secondary | ICD-10-CM

## 2024-02-09 LAB — POC CHEM 8
Anion Gap, POC: 7.1 mmol/L — ABNORMAL LOW (ref 10–20)
POC Chloride: 101 mmol/L (ref 98–107)
POC Creatinine: 0.55 mg/dL — ABNORMAL LOW (ref 0.6–1.3)
POC Glucose: 86 mg/dL (ref 74–99)
POC Ionized Calcium: 1.22 mmol/L (ref 1.15–1.33)
POC Potassium: 4.2 mmol/L (ref 3.5–5.1)
POC Sodium: 136 mmol/L (ref 136–145)
POC TCO2: 27.9 mmol/L (ref 21–32)
eGFR, POC: >90 mL/min/{1.73_m2} (ref >60)

## 2024-02-09 MED ORDER — ROMOSOZUMAB-AQQG 105 MG/1.17ML SC SOSY
105 | Freq: Once | SUBCUTANEOUS | Status: AC
Start: 2024-02-09 — End: 2024-02-09
  Administered 2024-02-09: 15:00:00 105 mg via SUBCUTANEOUS

## 2024-02-09 MED FILL — EVENITY 105 MG/1.17ML SC SOSY: 105 MG/1.17ML | SUBCUTANEOUS | Qty: 1.17 | Fill #0

## 2024-02-09 NOTE — Progress Notes (Signed)
 [facility] Progress Note    Date: Feb 09, 2024    Name: Emily [name]    MRN: [id number]         DOB: 1951-04-25    [name] arrived ambulatory and in no distress for Evenity  Injections.  Assessment was completed, no acute issues at this time, no new complaints voiced.  Labs drawn via venipuncture from [facility] and processed.       [name]'s vitals were reviewed.  Vitals:    02/09/24 0955   BP: 129/77   Pulse: 69   Resp: 18   Temp: 97.8 F (36.6 C)   SpO2: 97%       Labs reviewed and within parameters for treatment. Pt denies any recent/upcoming dental procedures.   Recent Results (from the past 12 hours)   POC CHEM 8    Collection Time: 02/09/24 10:00 AM   Result Value Ref Range    POC Ionized Calcium 1.22 1.15 - 1.33 mmol/L    POC Sodium 136 136 - 145 mmol/L    POC Potassium 4.2 3.5 - 5.1 mmol/L    POC Chloride 101 98 - 107 mmol/L    POC TCO2 27.9 21 - 32 mmol/L    Anion Gap, POC 7.1 (L) 10 - 20 mmol/L    POC Glucose 86 74 - 99 mg/dL    POC Creatinine 6.06 (L) 0.6 - 1.3 mg/dL    eGFR, POC >30 >16 WF/UXN/2.35T7    UA Comment Comment Not Indicated.       Medications:  Medications Administered         romosozumab -aqqg (EVENITY ) injection 105 mg Admin Date  02/09/2024 Action  Given Dose  105 mg Route  SubCUTAneous Documented By  Elta Halter, RN        romosozumab -aqqg (EVENITY ) injection 105 mg Admin Date  02/09/2024 Action  Given Dose  105 mg Route  SubCUTAneous Documented By  Elta Halter, RN        1 syringe given in RUA per pt request  1 syringe given in LLQ per pt request      [name] tolerated treatment well and [facility].   Patient is aware of next appointment.    Camaya [employer] Virl Grimes, RN    Feb 09, 2024  Future Appointments   Date Time Provider Department Center   03/11/2024  9:30 AM SS CHEMO CHAIR 16 [facility]   04/10/2024  9:30 AM SS CHEMO CHAIR 21 [facility]   05/10/2024  1:30 PM SS CHEMO CHAIR 2 [facility]   06/10/2024  9:30 AM SS CHEMO CHAIR 23  [facility]   07/10/2024 10:00 AM SS CHEMO CHAIR 1 [facility]

## 2024-02-12 ENCOUNTER — Ambulatory Visit: Payer: MEDICARE

## 2024-02-23 ENCOUNTER — Encounter

## 2024-03-04 ENCOUNTER — Inpatient Hospital Stay: Payer: MEDICARE

## 2024-03-11 ENCOUNTER — Inpatient Hospital Stay: Payer: MEDICARE

## 2024-03-17 ENCOUNTER — Encounter

## 2024-04-07 ENCOUNTER — Encounter

## 2024-04-10 ENCOUNTER — Inpatient Hospital Stay: Payer: MEDICARE

## 2024-05-05 ENCOUNTER — Encounter

## 2024-05-10 ENCOUNTER — Ambulatory Visit: Payer: MEDICARE

## 2024-05-10 ENCOUNTER — Inpatient Hospital Stay: Payer: MEDICARE

## 2024-06-09 ENCOUNTER — Encounter

## 2024-06-10 ENCOUNTER — Inpatient Hospital Stay: Payer: MEDICARE

## 2024-07-07 ENCOUNTER — Encounter

## 2024-07-10 ENCOUNTER — Inpatient Hospital Stay: Payer: MEDICARE

## 2024-07-29 ENCOUNTER — Encounter: Payer: MEDICARE | Attending: Surgery

## 2024-08-07 NOTE — Progress Notes (Signed)
"  Faxed signed SWO and office note to [employer].  Confirmation received.   "

## 2024-08-19 ENCOUNTER — Ambulatory Visit: Admit: 2024-08-19 | Discharge: 2024-08-19 | Payer: MEDICARE | Attending: Surgery

## 2024-08-19 ENCOUNTER — Encounter

## 2024-08-19 VITALS — Ht 66.0 in | Wt 123.0 lb

## 2024-08-19 DIAGNOSIS — Z853 Personal history of malignant neoplasm of breast: Principal | ICD-10-CM

## 2024-08-19 NOTE — Progress Notes (Signed)
 "HISTORY OF PRESENT ILLNESS  Emily [name] is a 73 y.o. female.    HPI ESTABLISHED patient  here for f/u visit pertaining to spot on LEFT breast. pt states her skin is pulled apart. Receiving hyper baric oxygen treatment.      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 1/14 nodes positive with a 1 cm met, no ECE. 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  09/16/2013: LEFT mastectomy scar and capsule, excision: Fibromuscular tissue with nonspecific chronic inflammation and radiation atypia. No evidence of malignancy.  04/22/2014: LEFT mastectomy skin, scar revision: Benign dense collagen with granulation tissue and skin with foreign body giant cell reaction   MammaPrint: High Risk Luminal type A     12/25/2012 - 02/26/2013: S/p TC q 3 weeks x 4 (75 mg/m2; 600 mg/m2)  04/02/2013 - 05/10/2013: S/p XRT  05/17/2013 - 07/30/2013: Anastrozole, stopped due to joint pains   08/06/2013 - 09/30/2013: Exemestane stopped due joint pains  07/10/2013: Zometa q 6 months x 3 years, stopped   09/30/2013 - 03/08/2014: Tamoxifen, stopped 03/08/14 due to joint pains  03/23/2014 - 09/18/2014: Letrozole, stopped due to joint pains  10/04/2014 - 12/15/2014: Tamoxifen, stopped due to hot flashes and mood swings.  12/16/2014 - 01/30/2015: Anastrozole, stopped due to headache and   dizziness  02/09/2015 - 05/2018: Toremefine      Past Medical History:   Diagnosis Date    Anxiety     Arthritis     Basal cell carcinoma     TO LEGS, LT ARM    Bronchiectasis (HCC) 08/2020    Cancer (HCC)     left breast  cancer    Chronic tension headaches     Concussion 2011    Headache(784.0)     OCCAS    Hx of radiation therapy     COMPLETED IN 2014    Ill-defined condition     cellulitis L UE    Lymphedema of arm     left    Nausea & vomiting     Other and unspecified symptoms and signs involving general sensations and perceptions     HIVES RECENTLY- WAS IN ER Feb 19, 2013    Other ill-defined  conditions(799.89)     PHEOCHROMOCYTOMA    S/P chemotherapy, time since greater than 12 weeks     COMPLETED CHEMOTHERAPY IN JUNE 2014    SCCA (squamous cell carcinoma) of skin        Past Surgical History:   Procedure Laterality Date    APPENDECTOMY      BREAST RECONSTRUCTION  11/28/2012    BREAST RECONSTRUCTION /C  INSERTION EXPANDER & ALLODERM performed by Garnette CHRISTELLA Door, MD at [facility] AMBULATORY OR    BREAST RECONSTRUCTION Bilateral 09/16/2013    REMOVAL AND REPLACEMENT LEFT BREAST IMPLANT TISSUE EXPANDERS WITH SUBTOTAL CAPSULECTOMY, RIGHT BREAST IMPLANT REMOVAL AND REPLACEMENT FOR SYMMETRY/FAT GRAFTING TO BILATERAL BREASTS performed by Garnette CHRISTELLA Door, MD at [facility] MAIN OR    BREAST SURGERY  '91    breast implants; replaced bilat implants 2011    BREAST SURGERY Left 04/22/2014    REMOVAL OF LEFT BREAST IMPLANT, WASHOUT AND CLOSURE OF LEFT BREAST WOUND performed by Garnette CHRISTELLA Door, MD at [facility] MAIN OR    COLONOSCOPY      ENDOSCOPY, COLON, DIAGNOSTIC      GYN      c-section    HEENT      right adrenal removed  HEENT      T&A    MASTECTOMY Left     MASTECTOMY, MODIFIED RADICAL  11/28/2012    LEFT BREAST MODIFIED RADICAL MASTECTOMY WITH AXILLARY DISSECTION W/ RECONSTRUCTION LEFT BREAST WITH TISSUE EXPANDER AND ALLODERM;PORTACATH INSERTION performed by Khiley Lieser Pellicane Jr V, MD at [facility] AMBULATORY OR    ORTHOPEDIC SURGERY      bilateral shoulder arthroscopy    OVARY REMOVAL      right    PR UNLISTED PROCEDURE ABDOMEN PERITONEUM & OMENTUM      right adenal removed    PR UNLISTED PROCEDURE ABDOMEN PERITONEUM & OMENTUM      hernia mesh repair    TONSILLECTOMY      VASCULAR SURGERY      PORT, THEN REMOVED       Social History     Socioeconomic History    Marital status: Single     Spouse name: Not on file    Number of children: Not on file    Years of education: Not on file    Highest education level: Not on file   Occupational History    Not on file   Tobacco Use    Smoking status: Never    Smokeless tobacco: Never   Vaping Use     Vaping status: Never Used   Substance and Sexual Activity    Alcohol use: No    Drug use: No    Sexual activity: Not on file   Other Topics Concern    Not on file   Social History Narrative    Not on file     Social Drivers of Health     Financial Resource Strain: Not on file   Food Insecurity: Not on file   Transportation Needs: Not on file   Physical Activity: Not on file   Stress: Not on file   Social Connections: Not on file   Intimate Partner Violence: Not on file   Housing Stability: Not on file       Current Outpatient Medications on File Prior to Visit   Medication Sig Dispense Refill    levalbuterol (XOPENEX) 1.25 MG/3ML nebulizer solution Take 3 mLs by nebulization in the morning and at bedtime      NONFORMULARY Apply topically as needed (PAIN TO RT HIP) Ketoprofen 10% PLO GEL    Compounded at [employer] to neck and shoulders      docusate sodium (COLACE) 100 MG capsule Take 1 capsule by mouth 2 times daily as needed for Constipation      Rimegepant Sulfate (NURTEC) 75 MG TBDP Take 1 tablet by mouth as needed (MIGRAINES) As directed      fluticasone  (FLONASE ) 50 MCG/ACT nasal spray 1 spray by Each Nostril route as needed      Mastectomy Bra MISC by Does not apply route Mastectomy bras and prosthesis 6 each 0    acetaminophen  (TYLENOL ) 500 MG tablet Take 1 tablet by mouth every 6 hours as needed for Pain      albuterol  sulfate HFA (PROVENTIL ;VENTOLIN ;PROAIR ) 108 (90 Base) MCG/ACT inhaler Inhale 2 puffs into the lungs every 4 hours as needed for Wheezing or Shortness of Breath      DULoxetine  (CYMBALTA ) 30 MG extended release capsule Take 1 capsule by mouth 2 times daily      fluticasone  furoate-vilanterol (BREO ELLIPTA) 100-25 MCG/ACT inhaler Inhale 1 puff into the lungs nightly      oxyCODONE  (ROXICODONE ) 5 MG immediate release tablet Take  1 tablet by mouth every 6 hours as needed for Pain.      sodium chloride , Inhalant, (HYPER-SAL) 7 % nebulizer solution Take 4 mLs by nebulization in the  morning and at bedtime FOR USE AFTER XOPENEX NEBULIZER      zolpidem  (AMBIEN ) 10 MG tablet Take 1 tablet by mouth nightly.       No current facility-administered medications on file prior to visit.       Allergies   Allergen Reactions    Bacitracin Hives and Swelling    Nsaids Swelling     Throat swells    Aspirin Palpitations and Swelling     Other reaction(s): Unknown (comments)  Gi bleeding  Other reaction(s): Other (see comments), Rapid pulse  Other reaction(s): Unknown (comments)  Gi bleeding    Prochlorperazine Rash       OB History    No obstetric history on file.      Obstetric Comments   Menarche 42, LMP age 19, # of children 2, age of 1st delivery 40, Hysterectomy/oophorectomy no/yes, Breast bx yes/left 2013, history of breast feeding no, BCP no, Hormone therapy in the past               Physical Exam  Exam conducted with a chaperone present.   Constitutional:       Appearance: She is not diaphoretic.   HENT:      Head: Normocephalic and atraumatic.      Right Ear: External ear normal.      Left Ear: External ear normal.   Eyes:      General: No scleral icterus.        Right eye: No discharge.         Left eye: No discharge.      Pupils: Pupils are equal, round, and reactive to light.   Cardiovascular:      Rate and Rhythm: Normal rate and regular rhythm.   Pulmonary:      Effort: Pulmonary effort is normal. No respiratory distress.      Breath sounds: Normal breath sounds. No stridor.   Abdominal:      General: There is no distension.      Palpations: Abdomen is soft. There is no mass.      Tenderness: There is no abdominal tenderness.   Musculoskeletal:         General: Normal range of motion.      Cervical back: Normal range of motion and neck supple.   Lymphadenopathy:      Cervical: No cervical adenopathy.   Skin:     General: Skin is warm.   Neurological:      Mental Status: She is alert and oriented to person, place, and time.   Psychiatric:         Behavior: Behavior normal.         Thought  Content: Thought content normal.         Judgment: Judgment normal.         ASSESSMENT and PLAN   Diagnosis Orders   1. History of left breast cancer  MRI BREAST BILATERAL W WO CONTRAST      2. At high risk for breast cancer  MRI BREAST BILATERAL W WO CONTRAST      3. Breast implant status  MRI BREAST BILATERAL W WO CONTRAST        Patient presents to follow up on spot on LEFT breast and is doing well overall. Physical exam of the LEFT  breast noted Well-healed incision s/p excision. Had LEFT breast fat necrosis excised. Will get MRI.     F/U MRI in 1 year. This plan was reviewed with the patient and patient agrees. All questions were answered.    Total time spent excluding procedural time was 20 minutes.    I, Reymundo Stakes, am scribing for and in the presence of Pellicane Jr, Steffany Schoenfelder V, MD. 08/19/24/11:20 AM EST    I, Dr. Pellicane, personally performed the services described in this document as scribed by Reymundo Stakes in my presence, and it is both accurate and complete.   "

## 2024-08-19 NOTE — Progress Notes (Deleted)
"  HISTORY OF PRESENT ILLNESS  Emily [name] is a 73 y.o. female     HPI ESTABLISHED patient  here for f/u visit pertaining to spot on LEFT breast.pt states her skin is pulled apart. Hyper baric treatment.          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 1/14 nodes positive with a 1 cm met, no ECE. 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  09/16/2013: LEFT mastectomy scar and capsule, excision: Fibromuscular tissue with nonspecific chronic inflammation and radiation atypia. No evidence of malignancy.  04/22/2014: LEFT mastectomy skin, scar revision: Benign dense collagen with granulation tissue and skin with foreign body giant cell reaction   MammaPrint: High Risk Luminal type A     12/25/2012 - 02/26/2013: S/p TC q 3 weeks x 4 (75 mg/m2; 600 mg/m2)  04/02/2013 - 05/10/2013: S/p XRT  05/17/2013 - 07/30/2013: Anastrozole, stopped due to joint pains   08/06/2013 - 09/30/2013: Exemestane stopped due joint pains  07/10/2013: Zometa q 6 months x 3 years, stopped   09/30/2013 - 03/08/2014: Tamoxifen, stopped 03/08/14 due to joint pains  03/23/2014 - 09/18/2014: Letrozole, stopped due to joint pains  10/04/2014 - 12/15/2014: Tamoxifen, stopped due to hot flashes and mood swings.  12/16/2014 - 01/30/2015: Anastrozole, stopped due to headache and   dizziness  02/09/2015 - 05/2018: Toremefine        Review of Systems      Physical Exam       ASSESSMENT and PLAN  {Assessment and Plan Chronic [phone]}    "

## 2024-08-19 NOTE — Progress Notes (Signed)
"  Order for [employer] and demographics page faxed with confirmation.   "

## 2024-10-11 ENCOUNTER — Ambulatory Visit: Payer: MEDICARE

## 2024-10-21 ENCOUNTER — Encounter

## 2024-10-30 ENCOUNTER — Ambulatory Visit: Payer: MEDICARE
